# Patient Record
Sex: Male | Born: 1970
Health system: Southern US, Community
[De-identification: ages and names within clinical notes are randomized; demographics above are authoritative.]

## PROBLEM LIST (undated history)

## (undated) DIAGNOSIS — IMO0002 Reserved for concepts with insufficient information to code with codable children: Secondary | ICD-10-CM

## (undated) DIAGNOSIS — I2699 Other pulmonary embolism without acute cor pulmonale: Secondary | ICD-10-CM

## (undated) DIAGNOSIS — L0591 Pilonidal cyst without abscess: Secondary | ICD-10-CM

## (undated) DIAGNOSIS — Z22322 Carrier or suspected carrier of Methicillin resistant Staphylococcus aureus: Secondary | ICD-10-CM

## (undated) DIAGNOSIS — F329 Major depressive disorder, single episode, unspecified: Secondary | ICD-10-CM

## (undated) DIAGNOSIS — K219 Gastro-esophageal reflux disease without esophagitis: Secondary | ICD-10-CM

## (undated) DIAGNOSIS — G629 Polyneuropathy, unspecified: Secondary | ICD-10-CM

## (undated) DIAGNOSIS — I739 Peripheral vascular disease, unspecified: Secondary | ICD-10-CM

## (undated) DIAGNOSIS — G8929 Other chronic pain: Secondary | ICD-10-CM

## (undated) DIAGNOSIS — I82629 Acute embolism and thrombosis of deep veins of unspecified upper extremity: Secondary | ICD-10-CM

## (undated) DIAGNOSIS — I10 Essential (primary) hypertension: Secondary | ICD-10-CM

## (undated) DIAGNOSIS — G4730 Sleep apnea, unspecified: Secondary | ICD-10-CM

## (undated) DIAGNOSIS — F32A Depression, unspecified: Secondary | ICD-10-CM

## (undated) DIAGNOSIS — F419 Anxiety disorder, unspecified: Secondary | ICD-10-CM

## (undated) DIAGNOSIS — M549 Dorsalgia, unspecified: Secondary | ICD-10-CM

## (undated) HISTORY — DX: Polyneuropathy, unspecified: G62.9

## (undated) HISTORY — DX: Carrier or suspected carrier of methicillin resistant Staphylococcus aureus: Z22.322

## (undated) HISTORY — DX: Depression, unspecified: F32.A

## (undated) HISTORY — DX: Major depressive disorder, single episode, unspecified: F32.9

## (undated) HISTORY — DX: Essential (primary) hypertension: I10

## (undated) HISTORY — DX: Peripheral vascular disease, unspecified: I73.9

## (undated) HISTORY — DX: Anxiety disorder, unspecified: F41.9

## (undated) HISTORY — PX: OTHER SURGICAL HISTORY: SHX169

---

## 1998-10-19 HISTORY — PX: THORACIC OUTLET SURGERY: SHX2502

## 2000-10-19 HISTORY — PX: WRIST FUSION: SHX839

## 2000-11-26 ENCOUNTER — Encounter: Payer: Self-pay | Admitting: Vascular Surgery

## 2000-11-30 ENCOUNTER — Encounter: Payer: Self-pay | Admitting: Vascular Surgery

## 2000-11-30 ENCOUNTER — Inpatient Hospital Stay (HOSPITAL_COMMUNITY): Admission: RE | Admit: 2000-11-30 | Discharge: 2000-12-04 | Payer: Self-pay | Admitting: Vascular Surgery

## 2000-11-30 ENCOUNTER — Encounter (INDEPENDENT_AMBULATORY_CARE_PROVIDER_SITE_OTHER): Payer: Self-pay | Admitting: Specialist

## 2001-11-15 ENCOUNTER — Encounter: Payer: Self-pay | Admitting: Occupational Medicine

## 2001-11-15 ENCOUNTER — Encounter: Admission: RE | Admit: 2001-11-15 | Discharge: 2001-11-15 | Payer: Self-pay | Admitting: Occupational Medicine

## 2002-02-16 ENCOUNTER — Encounter: Payer: Self-pay | Admitting: Occupational Medicine

## 2002-02-16 ENCOUNTER — Emergency Department (HOSPITAL_COMMUNITY): Admission: EM | Admit: 2002-02-16 | Discharge: 2002-02-16 | Payer: Self-pay | Admitting: Emergency Medicine

## 2002-02-16 ENCOUNTER — Encounter: Admission: RE | Admit: 2002-02-16 | Discharge: 2002-02-16 | Payer: Self-pay | Admitting: Occupational Medicine

## 2002-02-21 ENCOUNTER — Ambulatory Visit (HOSPITAL_COMMUNITY): Admission: RE | Admit: 2002-02-21 | Discharge: 2002-02-21 | Payer: Self-pay | Admitting: Orthopedic Surgery

## 2002-02-21 ENCOUNTER — Encounter: Payer: Self-pay | Admitting: Orthopedic Surgery

## 2002-09-01 ENCOUNTER — Encounter: Payer: Self-pay | Admitting: Family Medicine

## 2002-09-01 ENCOUNTER — Encounter: Admission: RE | Admit: 2002-09-01 | Discharge: 2002-09-01 | Payer: Self-pay | Admitting: Family Medicine

## 2004-03-24 ENCOUNTER — Encounter: Admission: RE | Admit: 2004-03-24 | Discharge: 2004-03-24 | Payer: Self-pay | Admitting: Family Medicine

## 2004-04-01 ENCOUNTER — Ambulatory Visit (HOSPITAL_BASED_OUTPATIENT_CLINIC_OR_DEPARTMENT_OTHER): Admission: RE | Admit: 2004-04-01 | Discharge: 2004-04-01 | Payer: Self-pay | Admitting: Family Medicine

## 2004-06-28 ENCOUNTER — Encounter
Admission: RE | Admit: 2004-06-28 | Discharge: 2004-06-28 | Payer: Self-pay | Admitting: Physical Medicine and Rehabilitation

## 2004-11-19 ENCOUNTER — Encounter: Admission: RE | Admit: 2004-11-19 | Discharge: 2004-11-19 | Payer: Self-pay | Admitting: Family Medicine

## 2004-12-16 ENCOUNTER — Encounter: Admission: RE | Admit: 2004-12-16 | Discharge: 2004-12-16 | Payer: Self-pay | Admitting: Specialist

## 2009-07-13 ENCOUNTER — Emergency Department (HOSPITAL_COMMUNITY): Admission: EM | Admit: 2009-07-13 | Discharge: 2009-07-13 | Payer: Self-pay | Admitting: Emergency Medicine

## 2010-09-05 ENCOUNTER — Telehealth: Payer: Self-pay | Admitting: Internal Medicine

## 2010-09-09 ENCOUNTER — Ambulatory Visit: Payer: Self-pay | Admitting: Internal Medicine

## 2010-09-09 DIAGNOSIS — M545 Low back pain, unspecified: Secondary | ICD-10-CM | POA: Insufficient documentation

## 2010-09-09 DIAGNOSIS — F17200 Nicotine dependence, unspecified, uncomplicated: Secondary | ICD-10-CM | POA: Insufficient documentation

## 2010-09-15 ENCOUNTER — Encounter: Payer: Self-pay | Admitting: Internal Medicine

## 2010-11-18 NOTE — Progress Notes (Signed)
Summary: late rsc ov  Phone Note Call from Patient Call back at [facility] Phone [phone]   Caller: Patient Call For: kw Summary of Call: Pt rsc late due to childcare issues and no self pay monies. Pt rsc to 09-09-2010 morning ov. Initial call taken by: Heron Sabins,  September 05, 2010 1:20 PM  Follow-up for Phone Call        noted-  no charge Follow-up by: Gordy Savers  MD,  September 05, 2010 1:56 PM  Additional Follow-up for Phone Call Additional follow up Details #1::        [facility] Additional Follow-up by: Heron Sabins,  September 05, 2010 3:27 PM    Additional Follow-up for Phone Call Additional follow up Details #2::    pt is aware he will not be charged for late rsc ov Follow-up by: Heron Sabins,  September 09, 2010 11:45 AM

## 2010-11-18 NOTE — Letter (Signed)
Summary: PATIENT HX FORM  PATIENT HX FORM   Imported By: Georgian Co 09/15/2010 16:00:41  _____________________________________________________________________  External Attachment:    Type:   Image     Comment:   External Document

## 2010-11-18 NOTE — Assessment & Plan Note (Signed)
Summary: BRAND NEW PT/TO EST/CJR pt rsc due to childcare issues/njr   Vital Signs:  Patient profile:   40 year old male Height:      75.5 inches Weight:      325 pounds BMI:     40.23 Temp:     98.3 degrees F oral BP sitting:   138 / 74  (right arm) Cuff size:   large  Vitals Entered By: Duard Brady LPN (September 09, 2010 8:40 AM) CC: new to establish - back pain Is Patient Diabetic? No   CC:  new to establish - back pain.  History of Present Illness: 40 year old patient who is seen today to establish with our practice.  He has a history of low back pain since 2005 and has been unable to work since October of 2009.  He has been followed by Dr. Horald Chestnut and Dr. Shon Baton and has benefited somewhat from injection therapy.  He has a history of exogenous obesity.  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  2000 exogenous obesity tobacco abuse negative sleep study for OSA, 2005  Past Surgical History: status post rib resection  2000 due to thoracic outlet obstruction, and DVT left upper extremity, complicated by pulmonary embolism lumbar epidurals surgery for a fracture, right wrist  Family History: Reviewed history and no changes required. father age 42, history of postural arthritis, chronic low back pain PAD, obstructive sleep apnea mother died at 51, complications of diabetes, and lower extremity infection  One brother one sister both older in good health  Social History: Reviewed history and no changes required. Divorced Current Smoker has applied for disability has been unable to work since October 2009Smoking Status:  current  Review of  Systems       The patient complains of weight gain.  The patient denies anorexia, fever, weight loss, vision loss, decreased hearing, hoarseness, chest pain, syncope, dyspnea on exertion, peripheral edema, prolonged cough, headaches, hemoptysis, abdominal pain, melena, hematochezia, severe indigestion/heartburn, hematuria, incontinence, genital sores, muscle weakness, suspicious skin lesions, transient blindness, difficulty walking, depression, unusual weight change, abnormal bleeding, enlarged lymph nodes, angioedema, breast masses, and testicular masses.    Physical Exam  General:  overweight-appearing.  and blood pressure 110/70 Head:  Normocephalic and atraumatic without obvious abnormalities. No apparent alopecia or balding. Eyes:  No corneal or conjunctival inflammation noted. EOMI. Perrla. Funduscopic exam benign, without hemorrhages, exudates or papilledema. Vision grossly normal. Ears:  External ear exam shows no significant lesions or deformities.  Otoscopic examination reveals clear canals, tympanic membranes are intact bilaterally without bulging, retraction, inflammation or discharge. Hearing is grossly normal bilaterally. Nose:  External nasal examination shows no deformity or inflammation. Nasal mucosa are pink and moist without lesions or exudates. Mouth:  pharyngeal crowding and low-lying uvula.   Neck:  No deformities, masses, or tenderness noted. Chest Wall:  No deformities, masses, tenderness or gynecomastia noted. Breasts:  No masses or gynecomastia noted Lungs:  Normal respiratory effort, chest expands symmetrically. Lungs are clear to auscultation, no crackles or wheezes. Heart:  Normal rate and regular rhythm. S1 and S2 normal without gallop, murmur, click, rub or other extra sounds. Abdomen:  Bowel sounds positive,abdomen soft and non-tender without masses, organomegaly or  hernias noted. Genitalia:  Testes bilaterally descended without nodularity, tenderness or masses. No  scrotal masses or lesions. No penis lesions or urethral discharge. Msk:  No deformity or scoliosis noted of thoracic or lumbar spine.   Pulses:  R and L carotid,radial,femoral,dorsalis pedis and posterior tibial pulses are full and equal bilaterally Extremities:  No clubbing, cyanosis, edema, or deformity noted with normal full range of motion of all joints.   Neurologic:  alert & oriented X3 and cranial nerves II-XII intact.   patellar and Achilles reflexes intact.  Slight subjective decrease sensation left foot and ankle no motor deficit Skin:  Intact without suspicious lesions or rashes multiple tattoos Cervical Nodes:  No lymphadenopathy noted Axillary Nodes:  No palpable lymphadenopathy Inguinal Nodes:  No significant adenopathy Psych:  Cognition and judgment appear intact. Alert and cooperative with normal attention span and concentration. No apparent delusions, illusions, hallucinations   Impression & Recommendations:  Problem # 1:  LOW BACK PAIN, CHRONIC (ICD-724.2)  Problem # 2:  OBESITY (ICD-278.00)  Problem # 3:  TOBACCO ABUSE (ICD-305.1)  Patient Instructions: 1)  Please schedule a follow-up appointment as needed. 2)  Limit your Sodium (Salt). 3)  Tobacco is very bad for your health and your loved ones! You Should stop smoking!. 4)  It is important that you exercise regularly at least 20 minutes 5 times a week. If you develop chest pain, have severe difficulty breathing, or feel very tired , stop exercising immediately and seek medical attention. 5)  You need to lose weight. Consider a lower calorie diet and regular exercise.  and a  Orders Added: 1)  New Patient Level IV [16109]

## 2011-01-23 LAB — POCT I-STAT, CHEM 8
BUN: 13 mg/dL (ref 6–23)
Calcium, Ion: 1.18 mmol/L (ref 1.12–1.32)
Chloride: 105 mEq/L (ref 96–112)
Creatinine, Ser: 0.9 mg/dL (ref 0.4–1.5)
Glucose, Bld: 130 mg/dL — ABNORMAL HIGH (ref 70–99)
HCT: 42 % (ref 39.0–52.0)
Hemoglobin: 14.3 g/dL (ref 13.0–17.0)
Potassium: 3.5 mEq/L (ref 3.5–5.1)
Sodium: 140 mEq/L (ref 135–145)
TCO2: 24 mmol/L (ref 0–100)

## 2011-01-23 LAB — CBC
HCT: 42.2 % (ref 39.0–52.0)
Hemoglobin: 15 g/dL (ref 13.0–17.0)
MCHC: 35.7 g/dL (ref 30.0–36.0)
MCV: 96 fL (ref 78.0–100.0)
Platelets: 206 10*3/uL (ref 150–400)
RBC: 4.39 MIL/uL (ref 4.22–5.81)
RDW: 15.1 % (ref 11.5–15.5)
WBC: 9.5 10*3/uL (ref 4.0–10.5)

## 2011-01-23 LAB — DIFFERENTIAL
Basophils Absolute: 0 10*3/uL (ref 0.0–0.1)
Basophils Relative: 0 % (ref 0–1)
Eosinophils Absolute: 0.2 10*3/uL (ref 0.0–0.7)
Eosinophils Relative: 2 % (ref 0–5)
Lymphocytes Relative: 30 % (ref 12–46)
Lymphs Abs: 2.8 10*3/uL (ref 0.7–4.0)
Monocytes Absolute: 0.5 10*3/uL (ref 0.1–1.0)
Monocytes Relative: 5 % (ref 3–12)
Neutro Abs: 6 10*3/uL (ref 1.7–7.7)
Neutrophils Relative %: 63 % (ref 43–77)

## 2011-01-23 LAB — HEMOCCULT GUIAC POC 1CARD (OFFICE): Fecal Occult Bld: POSITIVE

## 2011-03-06 NOTE — Op Note (Signed)
[place]. [facility]  Patient:    Eduardo [name], Eduardo [name]                  MRN: [id number] Proc. Date: 11/30/00 Adm. Date:  [id number] Attending:  Bennye Alm                           Operative Report  PREOPERATIVE DIAGNOSIS:  Thoracic outlet syndrome.  POSTOPERATIVE DIAGNOSIS:  Thoracic outlet syndrome.  OPERATION PERFORMED:  Transaxillary, left first rib resection.  SURGEON:  Di Kindle. Edilia Bo, M.D.  ASSISTANT: 1. D. Karle Plumber, M.D. 2. Loura Pardon, P.A. 3. Sherrie George, P.A.  ANESTHESIA:  General.  INDICATIONS FOR PROCEDURE:  The patient is a 40 year old gentleman who had been in [place] and developed a Paget-von Shrotter syndrome.  This was lysed open in [place] and he underwent successful thrombolysis.  He had been on Coumadin for this.  He was then seen back in [place] and it was felt that he did have a thoracic outlet syndrome explaining his venous problem.  However, his symptoms resolved and we initially decided to not proceed with first rib resection.  He later presented with left arm weakness and paresthesias in the left arm and evidence of neurogenic thoracic outlet syndrome.  Therefore, after extensive discussion, we elected to proceed with transaxillary first rib resection.  We reviewed the potential complications of the procedure in detail including but not limited to bleeding, nerve injury, paralysis of the diaphragm secondary to injury of the phrenic nerve, arterial or venous injury, injury to the brachial plexus, persistent pain, pneumothorax or other unpredictable medical problems.  All questions were answered and he wished to proceed with surgery.  DESCRIPTION OF PROCEDURE:  The patient was taken to the operating room and received a general anesthetic.  The patient was placed in a full lateral decubitus position.  The back, axillary area, shoulder and left arm were prepped and draped in the  usual sterile fashion.  The hand and forearm were placed in a sterile stockinette.  The assistant elevated the arm.  An incision was made at the inferior border of the axillary hair line and the dissection was carried down to the axillary fascia to the serratus anterior and bluntly, this dissection was continued up to the level of the first rib.  The lateral thoracic artery and vein were divided as was the subscapular vein.  I tried to stay medial away from the long thoracic and thoracodorsal nerves.  This was difficult dissection as the patient was large and very muscular but we were able to identify the first rib and also the entry of the artery, vein and brachial plexus.  The intercostal muscles were divided on top of the first rib and periosteum was elevated circumferentially.  This was a subperiosteal dissection.  As it was as extremely deep hole, I could not fit the bone shear up to this level and we chipped away at the bone using a rongeur.  I then resected in a piecemeal fashion an approximately 2 cm to 3 cm segment of the bone underneath the axillary vein, axillary artery and brachial plexus.  The edges of the bone were rasped.  Careful hemostasis was obtained.  We did not enter the pleura.  We did check for this at completion and there was no evidence of pleural injury.  I also of note, did divide the anterior scalene muscle on top of the border of the first  rib and also partially divided the posterior scalene muscle.  Hemostasis was obtained in the wound and a 15 Blake drain was placed.  The wound was closed with a deep layer of 2-0 Vicryl, a subcutaneous layer of 3-0 Vicryl and the skin closed with 4-0 subcuticular stitch.  A sterile dressing was applied.  The patient tolerated the procedure well, was transferred to the recovery room in satisfactory condition.  The sponge and needle counts were correct. DD:  11/30/00 TD:  12/01/00 Job: [id number]

## 2011-03-06 NOTE — Discharge Summary (Signed)
[place]. [facility]  Patient:    Eduardo [name], Eduardo [name]                  MRN: [id number] Adm. Date:  [id number] Disc. Date: 12/04/00 Attending:  Bennye Alm Dictator:   Loura Pardon, P.A.-C. CC:         Desma Maxim, M.D.   Discharge Summary  ADDENDUM  DATE OF BIRTH:  1970/11/22  REFERRING PHYSICIAN:  Desma Maxim, M.D.  [name] discharge was moved up from December 02, 2000 to December 04, 2000, mostly due to pain concerns.  [name] has been supported throughout his hospitalization on IV pain medications, at first the PCA morphine pump, then adding IV Toradol and now IV Toradol and oral Dilaudid. His incision is healing nicely.  There was no evidence of erythema or drainage.  He is gradually becoming less dependent upon pain medication.  His left arm is becoming more mobile.  He has regained much of the feeling that was lost in the fourth and fifth digits of the left hand and the ulnar nerve supply in the left hand.  He still has some numbness in the triceps area of the upper arm and in the medial portion of the left forearm.  He is working with physical therapy and will continue to work with physical therapy as an outpatient to regain range of motion and strengthening in the left arm.  He will go home now on the medications on December 04, 2000.  DISCHARGE MEDICATIONS: 1. Dilaudid 2 mg 1-2 tabs p.o. q.3-8h. as needed for pain. 2. Vioxx 50 mg daily.  He is to ambulate as tolerated.  He is asked not to drive nor lift anything more than 10 pounds for six weeks.  He may shower daily, beginning upon his discharge, and he will have an office visit with Dr. Edilia Bo, Wednesday, December 05, 2000, at 8:10 in the morning.  Once again, physical therapy is arranged for him as an outpatient. DD:  12/03/00 TD:  12/04/00 Job: [id number]

## 2011-03-06 NOTE — H&P (Signed)
[place]. [facility]  Patient:    Eduardo [name], Eduardo [name]                    MRN: [id number] Adm. Date:  11/30/00 Attending:  Di Kindle. Edilia Bo, M.D. Dictator:   Marlowe Kays, P.A. CC:         Desma Maxim, M.D.   History and Physical  DATE OF BIRTH:  16-Sep-1971  HISTORY OF PRESENT ILLNESS:  Eduardo [name] is a pleasant 40 year old white male, referred by Dr. 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  He was admitted to the hospital in [facility] on August 31, 2000, and was found to have significant left subclavian vein/axillary vein deep vein thrombosis.  He underwent an extensive evaluation, including a venogram which showed occlusive thrombosis on the left brachial and subclavian and axillary vein, and underwent a successful thrombolysis; however, he was left with an irregular segment of vein consistent with thoracic outlet syndrome and Paget-von Schrotter syndrome.   The patient was evaluated by Dr. Di Kindle. Edilia Bo for possible first rib resection.  At the time of the initial visit on September 15, 2000, the patient was still symptomatic, complaining of mild chest pain related to his pulmonary embolism, experienced during the hospitalization.  It was recommended that the patient be on Coumadin therapy for about two months, and see if the symptoms would resolve.  By October 20, 2000, the symptoms had subsided, therefore a nonoperative approach was taken; however, he presented on October 21, 2000, with evidence of neurogenic thoracic outlet syndrome, with paresthesias along the lateral aspect of the left arm, and left arm weakness, worse with external rotation and abduction of the arm. No other complaints.  No chest pain.  No shortness of breath or dyspnea on exertion.   Dr. Edilia Bo recommends that the patient undergo a first rib resection, which is scheduled for November 30, 2000.  No cough or sputum production.  No fever or chills.  No unexplained weight loss or GERD symptoms. No arrhythmias, no peripheral edema.  No hemoptysis.  No symptoms of TIA, CVA, or amaurosis fugax.  PAST MEDICAL HISTORY: 1. 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 2. Recent history of PE during the hospitalization secondary to    number one in November 2001.  PAST SURGICAL HISTORY:  None.  CURRENT MEDICATIONS:  He was taking Coumadin 7.5 mg q.d., but stopped on November 24, 2000.  ALLERGIES:  No known drug allergies.  REVIEW OF SYSTEMS:  See the HPI and the past medical history for significant positives.  No diabetes mellitus, kidney disease, hypertension, or chronic obstructive pulmonary disease.  FAMILY HISTORY:  Significant for his mother with a history of diabetes mellitus, otherwise noncontributory.  SOCIAL HISTORY:  The patient is separated, with two children.  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  He denies any tobacco or alcohol intake.  PHYSICAL EXAMINATION:  GENERAL:  A well-developed, well-nourished 40 year old white male, in no acute distress, alert and oriented x 3.  VITAL SIGNS:  Blood pressure 120/80, pulse 64, respirations 16.  HEENT:  Head normocephalic, atraumatic.  PERRLA.  EOMI.  Funduscopic examination within normal limits.  NECK:  Supple, no jugular venous distention, bruits, or lymphadenopathy.  CHEST:  Symmetrical on inspiration.  LUNGS:  Clear to auscultation bilaterally.  CARDIOVASCULAR:  A regular rate and rhythm without murmurs, rubs, or gallops.  ABDOMEN:  Soft, nontender.  Bowel sounds x 4.  No masses or bruits.  GENITOURINARY:  Deferred.  RECTAL:  Deferred.  EXTREMITIES:  No cyanosis, clubbing, or edema.  No ulcerations.   Temperature warm.  PERIPHERAL PULSES:  Carotids 2+ bilaterally.  Femoral, popliteal, dorsalis pedis, and posterior tibialis 2+ bilaterally.  Gait is steady.  Deep tendon reflexes 2+ bilaterally.  Muscle strength 5/5.  ASSESSMENT/PLAN:  Thoracic outlet syndrome, for first rib resection, which will require significant physical therapy afterwards.  Dr. Edilia Bo has seen and evaluated this patient prior to the admission at [facility] on November 30, 2000, and has explained the risks and benefits involving the procedure, and the patient has agreed to continue. DD:  11/26/00 TD:  11/26/00 Job: [id number]

## 2011-03-06 NOTE — Discharge Summary (Signed)
[place]. [facility]  Patient:    Eduardo [name], Eduardo [name]                  MRN: [id number] Adm. Date:  [id number] Disc. Date: 12/02/00 Attending:  Bennye Alm Dictator:   Loura Pardon, P.A.-C. CC:         Desma Maxim, M.D.   Discharge Summary  DATE OF BIRTH:  1971-05-11  PRIMARY CARE PHYSICIAN:  Desma Maxim, M.D.  DISCHARGE DIAGNOSES: 1. 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 2. Neurogenic thoracic outlet syndrome despite conservative management. 3. Left subclavian/left axillary stenosis, hospitalized for thrombolysis with    subsequent pulmonary embolism November 2001.  SECONDARY DIAGNOSES:  None.  PROCEDURE:  November 30, 2000, transaxillary left first rib resection. Surgeon - Di Kindle. Edilia Bo, M.D.  The patient tolerated the procedure well and was transferred in a stable and satisfactory condition with adequate perfusion to the left upper extremity.  He awoke in the recovery room.  His mental status has been clear.  He had follow-up chest x-ray in the recovery room that showed no elevation of the left hemidiaphragm, no pneumothorax.  He had fine motor coordination and good sensation in the left hand.  DISCHARGE DISPOSITION:  Eduardo [name] is ready for discharge December 02, 2000, postoperative day #2.  He has remained afebrile in the postoperative period.  He does experience a fair amount of pain in the left axillary area where the incision was made.  He has full function of the left hand.  Range of motion is contracted in the left upper extremity secondary to pain.  His ability to abduct is decreased.  His postoperative laboratories have been within normal limits.  His hemoglobin was 15.7, hematocrit 44.8, creatinine 1.0, potassium 3.5 on the day after surgery.  His pain was controlled with PCA morphine; however, the patient does still experience some very acute pain which is  being controlled with Percocet in addition to the PCA morphine pump. He has been seen by physical therapy.  The physical therapists have explained the range of motion exercises to him, and he has been instructed in the "self-ranging" procedure to recondition the left upper extremity.  His incision was healing well.  There was no evidence of erythema or drainage. The JP drain was removed on postoperative day #1.  He has not required any supplemental oxygen in the postoperative period.  He has been afebrile.  His mental status has remained clear.  He is ambulating independently.  He is taking all nourishment well and tolerating it.  He has full GI tract function. He will go home on the following medications February 14.  DISCHARGE MEDICATIONS: 1. Tylox 1-2 tablets p.o. q.4-6. p.r.n. pain. 2. Vioxx 50 mg on February 14 and subsequently 25 mg daily.  DISCHARGE ACTIVITY:  Ambulation as tolerated.  DISCHARGE DIET:  Regular diet without restrictions.  He may shower daily beginning December 03, 2000, Friday.  He is to keep his incision clean and dry at all times.  He will have an office visit scheduled with Dr. Edilia Bo, December 15, 2000, at 8:10 in the morning.  BRIEF HISTORY:  [name] is a 40 year old male, referred by Desma Maxim, M.D.  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  He developed the sudden onset of swelling and discoloration of the left upper extremity  November 2001.  He was admitted to the hospital in [place], August 31, 2000.  At that point, he was found to have significant left subclavian, left axillary vein deep venous thrombosis. He underwent extensive evaluation, including a venogram, which showed occlusive thrombosis on the left brachial, subclavian, and axillary veins.  He underwent successful thrombolysis there.  He did, however, have  an irregular segment of vein consistent with thoracic outlet syndrome and Paget-von Schrotter syndrome.  The patient was evaluated by Dr. Waverly Ferrari, possible first rib resection.  At the time of the initial visit November 28, the patient was still symptomatic, complaining of mild chest pain related to his pulmonary embolism which he experienced during this hospitalization in November.  It was recommended that the patient continue on Coumadin therapy for two months to see if his symptoms would resolve.  By October 20, 2000, the symptoms had subsided somewhat, and a nonoperative approach was taken. However, he did present January 3 with evidence of neurogenic thoracic outlet syndrome with paresthesias along the lateral aspect of the left arm and left arm weakness which was made worse with external rotation and abduction of the arm.  Dr. Edilia Bo scheduled first rib resection November 30, 2000.  HOSPITAL COURSE:  As described in the discharge disposition.  Eduardo [name] was in a great deal of pain subsequent to the operation which was controlled with PCA morphine analgesic pump.  He was seen by physical therapy.  The physical therapist described range of motion exercises to be undertaken.  Mr. Argabright JP drain, which was placed intraoperatively, was removed on postoperative day #1.  His postoperative labs were within normal limits.  Mr. Eshelman at all times had clear mental status, was afebrile, did not require supplemental oxygen.  His incision looked good and was healing nicely.  He had preserved in the left upper extremity with good sensation in all digits with preserved fine motor coordination in the left hand.  His condition is stable and will no doubt improve as he recovers from the immediate postoperative period. DD:  12/01/00 TD:  12/02/00 Job: [id number]

## 2012-04-01 ENCOUNTER — Emergency Department (HOSPITAL_COMMUNITY)
Admission: EM | Admit: 2012-04-01 | Discharge: 2012-04-01 | Disposition: A | Discharge status: Home / Self Care | Payer: Self-pay | Attending: Emergency Medicine | Admitting: Emergency Medicine

## 2012-04-01 ENCOUNTER — Emergency Department (HOSPITAL_COMMUNITY): Payer: Self-pay

## 2012-04-01 ENCOUNTER — Encounter (HOSPITAL_COMMUNITY): Payer: Self-pay | Admitting: Nurse Practitioner

## 2012-04-01 DIAGNOSIS — I10 Essential (primary) hypertension: Secondary | ICD-10-CM | POA: Insufficient documentation

## 2012-04-01 DIAGNOSIS — S50812A Abrasion of left forearm, initial encounter: Secondary | ICD-10-CM

## 2012-04-01 DIAGNOSIS — S60512A Abrasion of left hand, initial encounter: Secondary | ICD-10-CM

## 2012-04-01 DIAGNOSIS — F17200 Nicotine dependence, unspecified, uncomplicated: Secondary | ICD-10-CM | POA: Insufficient documentation

## 2012-04-01 DIAGNOSIS — S5012XA Contusion of left forearm, initial encounter: Secondary | ICD-10-CM

## 2012-04-01 DIAGNOSIS — IMO0002 Reserved for concepts with insufficient information to code with codable children: Secondary | ICD-10-CM | POA: Insufficient documentation

## 2012-04-01 HISTORY — DX: Dorsalgia, unspecified: M54.9

## 2012-04-01 HISTORY — DX: Reserved for concepts with insufficient information to code with codable children: IMO0002

## 2012-04-01 HISTORY — DX: Other chronic pain: G89.29

## 2012-04-01 MED ORDER — OXYCODONE-ACETAMINOPHEN 5-325 MG PO TABS
2.0000 | ORAL_TABLET | Freq: Once | ORAL | Status: AC
  Administered 2012-04-01: 2 via ORAL
  Filled 2012-04-01: qty 2
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
## 2012-04-01 NOTE — Discharge Instructions (Signed)
Abrasions Abrasions are skin scrapes. Their treatment depends on how large and deep the abrasion is. Abrasions do not extend through all layers of the skin. A cut or lesion through all skin layers is called a laceration. HOME CARE INSTRUCTIONS   If you were given a dressing, change it at least once a day or as instructed by your caregiver. If the bandage sticks, soak it off with a solution of water or hydrogen peroxide.   Twice a day, wash the area with soap and water to remove all the cream/ointment. You may do this in a sink, under a tub faucet, or in a shower. Rinse off the soap and pat dry with a clean towel. Look for signs of infection (see below).   Reapply cream/ointment according to your caregiver's instruction. This will help prevent infection and keep the bandage from sticking. Telfa or gauze over the wound and under the dressing or wrap will also help keep the bandage from sticking.   If the bandage becomes wet, dirty, or develops a foul smell, change it as soon as possible.   Only take over-the-counter or prescription medicines for pain, discomfort, or fever as directed by your caregiver.  SEEK IMMEDIATE MEDICAL CARE IF:   Increasing pain in the wound.   Signs of infection develop: redness, swelling, surrounding area is tender to touch, or pus coming from the wound.   You have a fever.   Any foul smell coming from the wound or dressing.  Most skin wounds heal within ten days. Facial wounds heal faster. However, an infection may occur despite proper treatment. You should have the wound checked for signs of infection within 24 to 48 hours or sooner if problems arise. If you were not given a wound-check appointment, look closely at the wound yourself on the second day for early signs of infection listed above. MAKE SURE YOU:   Understand these instructions.   Will watch your condition.   Will get help right away if you are not doing well or get worse.  Document Released:  07/15/2005 Document Revised: 09/24/2011 Document Reviewed: 09/08/2011 [facility] Patient Information 2012 [place], [place].Bone Bruise  A bone bruise is a small hidden fracture of the bone. It typically occurs with bones located close to the surface of the skin.  SYMPTOMS  The pain lasts longer than a normal bruise.   The bruised area is difficult to use.   There may be discoloration or swelling of the bruised area.   When a bone bruise is found with injury to the anterior cruciate ligament (in the knee) there is often an increased:   Amount of fluid in the knee   Time the fluid in the knee lasts.   Number of days until you are walking normally and regaining the motion you had before the injury.   Number of days with pain from the injury.  DIAGNOSIS  It can only be seen on X-rays known as MRIs. This stands for magnetic resonance imaging. A regular X-Glori Machnik taken of a bone bruise would appear to be normal. A bone bruise is a common injury in the knee and the heel bone (calcaneus). The problems are similar to those produced by stress fractures, which are bone injuries caused by overuse. A bone bruise may also be a sign of other injuries. For example, bone bruises are commonly found where an anterior cruciate ligament (ACL) in the knee has been pulled away from the bone (ruptured). A ligament is a tough fibrous material that  connects bones together to make our joints stable. Bruises of the bone last a lot longer than bruises of the muscle or tissues beneath the skin. Bone bruises can last from days to months and are often more severe and painful than other bruises. TREATMENT Because bone bruises are sudden injuries you cannot often prevent them, other than by being extremely careful. Some things you can do to improve the condition are:  Apply ice to the sore area for 15 to 20 minutes, 3 to 4 times per day while awake for the first 2 days. Put the ice in a plastic bag, and place a towel between the  bag of ice and your skin.   Keep your bruised area raised (elevated) when possible to lessen swelling.   For activity:   Use crutches when necessary; do not put weight on the injured leg until you are no longer tender.   You may walk on your affected part as the pain allows, or as instructed.   Start weight bearing gradually on the bruised part.   Continue to use crutches or a cane until you can stand without causing pain, or as instructed.   If a plaster splint was applied, wear the splint until you are seen for a follow-up examination. Rest it on nothing harder than a pillow the first 24 hours. Do not put weight on it. Do not get it wet. You may take it off to take a shower or bath.   If an air splint was applied, more air may be blown into or out of the splint as needed for comfort. You may take it off at night and to take a shower or bath.   Wiggle your toes in the splint several times per day if you are able.   You may have been given an elastic bandage to use with the plaster splint or alone. The splint is too tight if you have numbness, tingling or if your foot becomes cold and blue. Adjust the bandage to make it comfortable.   Only take over-the-counter or prescription medicines for pain, discomfort, or fever as directed by your caregiver.   Follow all instructions for follow up with your caregiver. This includes any orthopedic referrals, physical therapy, and rehabilitation. Any delay in obtaining necessary care could result in a delay or failure of the bones to heal.  SEEK MEDICAL CARE IF:   You have an increase in bruising, swelling, or pain.   You notice coldness of your toes.   You do not get pain relief with medications.  SEEK IMMEDIATE MEDICAL CARE IF:   Your toes are numb or blue.   You have severe pain not controlled with medications.   If any of the problems that caused you to seek care are becoming worse.  Document Released: 12/26/2003 Document Revised:  09/24/2011 Document Reviewed: 05/09/2008 [facility] Patient Information 2012 [place], [place].Cryotherapy Cryotherapy means treatment with cold. Ice or gel packs can be used to reduce both pain and swelling. Ice is the most helpful within the first 24 to 48 hours after an injury or flareup from overusing a muscle or joint. Sprains, strains, spasms, burning pain, shooting pain, and aches can all be eased with ice. Ice can also be used when recovering from surgery. Ice is effective, has very few side effects, and is safe for most people to use. PRECAUTIONS  Ice is not a safe treatment option for people with:  Raynaud's phenomenon. This is a condition affecting small blood vessels in  the extremities. Exposure to cold may cause your problems to return.   Cold hypersensitivity. There are many forms of cold hypersensitivity, including:   Cold urticaria. Red, itchy hives appear on the skin when the tissues begin to warm after being iced.   Cold erythema. This is a red, itchy rash caused by exposure to cold.   Cold hemoglobinuria. Red blood cells break down when the tissues begin to warm after being iced. The hemoglobin that carry oxygen are passed into the urine because they cannot combine with blood proteins fast enough.   Numbness or altered sensitivity in the area being iced.  If you have any of the following conditions, do not use ice until you have discussed cryotherapy with your caregiver:  Heart conditions, such as arrhythmia, angina, or chronic heart disease.   High blood pressure.   Healing wounds or open skin in the area being iced.   Current infections.   Rheumatoid arthritis.   Poor circulation.   Diabetes.  Ice slows the blood flow in the region it is applied. This is beneficial when trying to stop inflamed tissues from spreading irritating chemicals to surrounding tissues. However, if you expose your skin to cold temperatures for too long or without the proper protection, you can  damage your skin or nerves. Watch for signs of skin damage due to cold. HOME CARE INSTRUCTIONS Follow these tips to use ice and cold packs safely.  Place a dry or damp towel between the ice and skin. A damp towel will cool the skin more quickly, so you may need to shorten the time that the ice is used.   For a more rapid response, add gentle compression to the ice.   Ice for no more than 10 to 20 minutes at a time. The bonier the area you are icing, the less time it will take to get the benefits of ice.   Check your skin after 5 minutes to make sure there are no signs of a poor response to cold or skin damage.   Rest 20 minutes or more in between uses.   Once your skin is numb, you can end your treatment. You can test numbness by very lightly touching your skin. The touch should be so light that you do not see the skin dimple from the pressure of your fingertip. When using ice, most people will feel these normal sensations in this order: cold, burning, aching, and numbness.   Do not use ice on someone who cannot communicate their responses to pain, such as small children or people with dementia.  HOW TO MAKE AN ICE PACK Ice packs are the most common way to use ice therapy. Other methods include ice massage, ice baths, and cryo-sprays. Muscle creams that cause a cold, tingly feeling do not offer the same benefits that ice offers and should not be used as a substitute unless recommended by your caregiver. To make an ice pack, do one of the following:  Place crushed ice or a bag of frozen vegetables in a sealable plastic bag. Squeeze out the excess air. Place this bag inside another plastic bag. Slide the bag into a pillowcase or place a damp towel between your skin and the bag.   Mix 3 parts water with 1 part rubbing alcohol. Freeze the mixture in a sealable plastic bag. When you remove the mixture from the freezer, it will be slushy. Squeeze out the excess air. Place this bag inside another  plastic bag. Slide the bag  into a pillowcase or place a damp towel between your skin and the bag.  SEEK MEDICAL CARE IF:  You develop white spots on your skin. This may give the skin a blotchy (mottled) appearance.   Your skin turns blue or pale.   Your skin becomes waxy or hard.   Your swelling gets worse.  MAKE SURE YOU:   Understand these instructions.   Will watch your condition.   Will get help right away if you are not doing well or get worse.  Document Released: 06/01/2011 Document Revised: 09/24/2011 Document Reviewed: 06/01/2011 [facility] Patient Information 2012 [place], [place].
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
## 2012-11-22 ENCOUNTER — Encounter (INDEPENDENT_AMBULATORY_CARE_PROVIDER_SITE_OTHER): Payer: Self-pay | Admitting: General Surgery

## 2012-11-22 ENCOUNTER — Ambulatory Visit (INDEPENDENT_AMBULATORY_CARE_PROVIDER_SITE_OTHER): Payer: Self-pay | Admitting: General Surgery

## 2012-11-22 VITALS — BP 152/80 | HR 76 | Temp 97.6°F | Resp 20 | Ht 75.0 in | Wt 298.6 lb

## 2012-11-22 DIAGNOSIS — L988 Other specified disorders of the skin and subcutaneous tissue: Secondary | ICD-10-CM

## 2012-11-22 DIAGNOSIS — L0591 Pilonidal cyst without abscess: Secondary | ICD-10-CM | POA: Insufficient documentation

## 2012-11-22 NOTE — Progress Notes (Signed)
Patient ID: Eduardo [name], male   DOB: 1971-02-24, 42 y.o.   MRN: [id number]  Chief Complaint  Patient presents with  . New Evaluation    eval draining pilo cyst    HPI Eduardo [name] is a 42 y.o. male.  With a chronic pilonidal cyst and disease. HPI  The patient has had chronic pilonidal disease for years. It is exacerbated by blunt injury several years ago and is chronically draining since was drained 2 or 3 years ago in the emergency department.  He comes in now because of continued pain and drainage. Past Medical History  Diagnosis Date  . Chronic back pain   . Degenerative disk disease   . Hypertension   . Seizures     Past Surgical History  Procedure Date  . Wrist fusion 2002  . Thoracic outlet surgery 2000    Family History  Problem Relation Age of Onset  . Cancer Maternal Grandmother     breast, ovarian & colon    Social History History  Substance Use Topics  . Smoking status: Current Every Day Smoker -- 1.0 packs/day  . Smokeless tobacco: Never Used  . Alcohol Use: No    No Known Allergies  Current Outpatient Prescriptions  Medication Sig Dispense Refill  . atenolol (TENORMIN) 25 MG tablet Take 25 mg by mouth daily.      . clonazePAM (KLONOPIN) 1 MG tablet Take 1 mg by mouth 2 (two) times daily as needed.      [name] oxyCODONE-acetaminophen (PERCOCET) 5-325 MG per tablet Take 1 tablet by mouth every 4 (four) hours as needed. For pain        Review of Systems Review of Systems  Constitutional: Negative for fever and chills.  Eyes: Negative.   Respiratory: Negative.   Cardiovascular: Negative.   Gastrointestinal: Negative.   Hematological: Negative.     Blood pressure 152/80, pulse 76, temperature 97.6 F (36.4 C), temperature source Temporal, resp. rate 20, height 6\' 3"  (1.905 m), weight 298 lb 9.6 oz (135.444 kg).  Physical Exam Physical Exam  Constitutional: He is oriented to person, place, and time. He appears well-developed and  well-nourished.  HENT:  Head: Normocephalic and atraumatic.  Eyes: Conjunctivae normal and EOM are normal. Pupils are equal, round, and reactive to light.  Neck: Normal range of motion. Neck supple.  Cardiovascular: Normal rate and regular rhythm.   Pulmonary/Chest: Effort normal and breath sounds normal.  Abdominal: Soft. Bowel sounds are normal.  Musculoskeletal: Normal range of motion.  Neurological: He is alert and oriented to person, place, and time. He has normal reflexes.  Skin: Skin is warm and dry.     Psychiatric: He has a normal mood and affect. His behavior is normal. Judgment and thought content normal.    Data Reviewed None  Assessment    Chronic pilonidal disease    Plan    Open excision with postoperative packing.       Cherylynn Ridges 11/22/2012, 10:39 AM

## 2012-12-20 ENCOUNTER — Encounter (HOSPITAL_COMMUNITY): Payer: Self-pay | Admitting: Emergency Medicine

## 2012-12-20 ENCOUNTER — Ambulatory Visit (HOSPITAL_BASED_OUTPATIENT_CLINIC_OR_DEPARTMENT_OTHER): Admit: 2012-12-20 | Payer: Self-pay | Admitting: General Surgery

## 2012-12-20 ENCOUNTER — Encounter (HOSPITAL_BASED_OUTPATIENT_CLINIC_OR_DEPARTMENT_OTHER): Payer: Self-pay

## 2012-12-20 ENCOUNTER — Emergency Department (INDEPENDENT_AMBULATORY_CARE_PROVIDER_SITE_OTHER)
Admission: EM | Admit: 2012-12-20 | Discharge: 2012-12-20 | Disposition: A | Discharge status: Home / Self Care | Payer: Self-pay | Source: Home / Self Care | Attending: Family Medicine | Admitting: Family Medicine

## 2012-12-20 DIAGNOSIS — T3991XA Poisoning by unspecified nonopioid analgesic, antipyretic and antirheumatic, accidental (unintentional), initial encounter: Secondary | ICD-10-CM

## 2012-12-20 SURGERY — EXCISION, PILONIDAL CYST, EXTENSIVE
Anesthesia: General

## 2012-12-20 MED ORDER — GI COCKTAIL ~~LOC~~
30.0000 mL | Freq: Once | ORAL | Status: AC
  Administered 2012-12-20: 30 mL via ORAL

## 2012-12-20 MED ORDER — RANITIDINE HCL 150 MG PO TABS: 150.0000 mg | ORAL_TABLET | Freq: Every day | ORAL | Status: DC

## 2012-12-20 MED ORDER — PANTOPRAZOLE SODIUM 40 MG PO TBEC: 40.0000 mg | DELAYED_RELEASE_TABLET | Freq: Every day | ORAL | Status: DC

## 2012-12-20 MED ORDER — GI COCKTAIL ~~LOC~~
ORAL | Status: AC
  Filled 2012-12-20: qty 30

## 2012-12-20 MED ORDER — ONDANSETRON 4 MG PO TBDP
ORAL_TABLET | ORAL | Status: AC
  Filled 2012-12-20: qty 1

## 2012-12-20 MED ORDER — ONDANSETRON 4 MG PO TBDP
4.0000 mg | ORAL_TABLET | Freq: Once | ORAL | Status: AC
  Administered 2012-12-20: 4 mg via ORAL

## 2012-12-20 NOTE — ED Provider Notes (Signed)
History     CSN: [id number]  Arrival date & time 12/20/12  1705   First MD Initiated Contact with Patient 12/20/12 1724      Chief Complaint  Patient presents with  . Abdominal Pain    (Consider location/radiation/quality/duration/timing/severity/associated sxs/prior treatment) Patient is a 42 y.o. male presenting with abdominal pain. The history is provided by the patient.  Abdominal Pain Pain location:  Epigastric Pain quality: burning   Pain radiates to:  Periumbilical region Pain severity:  Moderate Onset quality:  Sudden Timing:  Constant Progression:  Worsening Chronicity:  New Context comment:  Taking a lot of advil for back pain. Relieved by:  Nothing Associated symptoms: belching and nausea   Associated symptoms: no chest pain, no constipation, no diarrhea, no hematemesis, no hematochezia, no melena and no vomiting     Past Medical History  Diagnosis Date  . Chronic back pain   . Degenerative disk disease   . Hypertension   . Seizures     Past Surgical History  Procedure Laterality Date  . Wrist fusion  2002  . Thoracic outlet surgery  2000    Family History  Problem Relation Age of Onset  . Cancer Maternal Grandmother     breast, ovarian & colon    History  Substance Use Topics  . Smoking status: Current Every Day Smoker -- 1.00 packs/day  . Smokeless tobacco: Never Used  . Alcohol Use: No      Review of Systems  Constitutional: Positive for appetite change.  Cardiovascular: Negative for chest pain.  Gastrointestinal: Positive for nausea, abdominal pain and anal bleeding. Negative for vomiting, diarrhea, constipation, melena, hematochezia and hematemesis.    Allergies  Review of patient's allergies indicates no known allergies.  Home Medications   Current Outpatient Rx  Name  Route  Sig  Dispense  Refill  . atenolol (TENORMIN) 25 MG tablet   Oral   Take 25 mg by mouth daily.         . clonazePAM (KLONOPIN) 1 MG tablet   Oral  Take 1 mg by mouth 2 (two) times daily as needed.         [name] oxyCODONE-acetaminophen (PERCOCET) 5-325 MG per tablet   Oral   Take 1 tablet by mouth every 4 (four) hours as needed. For pain         . pantoprazole (PROTONIX) 40 MG tablet   Oral   Take 1 tablet (40 mg total) by mouth daily.   30 tablet   1   . ranitidine (ZANTAC) 150 MG tablet   Oral   Take 1 tablet (150 mg total) by mouth at bedtime.   30 tablet   0     BP 183/102  Pulse 74  Temp(Src) 98.5 F (36.9 C) (Oral)  Resp 18  SpO2 97%  Physical Exam  Nursing note and vitals reviewed. Constitutional: He is oriented to person, place, and time. He appears well-developed and well-nourished.  HENT:  Head: Normocephalic.  Right Ear: External ear normal.  Left Ear: External ear normal.  Mouth/Throat: Oropharynx is clear and moist.  Cardiovascular: Regular rhythm, normal heart sounds and intact distal pulses.   Pulmonary/Chest: Effort normal and breath sounds normal.  Abdominal: Soft. Bowel sounds are normal. He exhibits no distension and no mass. There is no hepatosplenomegaly. There is tenderness in the epigastric area. There is no rigidity, no rebound, no guarding and no CVA tenderness.  Genitourinary: Rectum normal and prostate normal. Rectal exam shows no external hemorrhoid,  no internal hemorrhoid, no mass and no tenderness. Guaiac negative stool.     Neurological: He is alert and oriented to person, place, and time.  Skin: Skin is warm and dry.    ED Course  Procedures (including critical care time)  Labs Reviewed - No data to display No results found.   1. Gastritis due to nonsteroidal anti-inflammatory drug, initial encounter       MDM          Linna Hoff, MD 12/21/12 [phone]

## 2012-12-20 NOTE — ED Notes (Signed)
Pt c/o abd pain x2 days Sx include: nauseas Denies: f/v/d, constipation Taking tums for discomfort  He is alert w/no signs of acute distress.

## 2012-12-22 ENCOUNTER — Emergency Department (HOSPITAL_COMMUNITY)
Admission: EM | Admit: 2012-12-22 | Discharge: 2012-12-23 | Disposition: A | Discharge status: Other Acute Inpatient Hospital | Payer: Self-pay | Attending: Emergency Medicine | Admitting: Emergency Medicine

## 2012-12-22 ENCOUNTER — Encounter (HOSPITAL_COMMUNITY): Payer: Self-pay

## 2012-12-22 ENCOUNTER — Other Ambulatory Visit: Payer: Self-pay

## 2012-12-22 DIAGNOSIS — F1120 Opioid dependence, uncomplicated: Secondary | ICD-10-CM | POA: Insufficient documentation

## 2012-12-22 DIAGNOSIS — F32A Depression, unspecified: Secondary | ICD-10-CM

## 2012-12-22 DIAGNOSIS — Z872 Personal history of diseases of the skin and subcutaneous tissue: Secondary | ICD-10-CM | POA: Insufficient documentation

## 2012-12-22 DIAGNOSIS — G8929 Other chronic pain: Secondary | ICD-10-CM | POA: Insufficient documentation

## 2012-12-22 DIAGNOSIS — Z8669 Personal history of other diseases of the nervous system and sense organs: Secondary | ICD-10-CM | POA: Insufficient documentation

## 2012-12-22 DIAGNOSIS — R197 Diarrhea, unspecified: Secondary | ICD-10-CM | POA: Insufficient documentation

## 2012-12-22 DIAGNOSIS — F329 Major depressive disorder, single episode, unspecified: Secondary | ICD-10-CM | POA: Insufficient documentation

## 2012-12-22 DIAGNOSIS — R45851 Suicidal ideations: Secondary | ICD-10-CM | POA: Insufficient documentation

## 2012-12-22 DIAGNOSIS — M549 Dorsalgia, unspecified: Secondary | ICD-10-CM | POA: Insufficient documentation

## 2012-12-22 DIAGNOSIS — F411 Generalized anxiety disorder: Secondary | ICD-10-CM | POA: Insufficient documentation

## 2012-12-22 DIAGNOSIS — I10 Essential (primary) hypertension: Secondary | ICD-10-CM | POA: Insufficient documentation

## 2012-12-22 DIAGNOSIS — F17200 Nicotine dependence, unspecified, uncomplicated: Secondary | ICD-10-CM | POA: Insufficient documentation

## 2012-12-22 DIAGNOSIS — F3289 Other specified depressive episodes: Secondary | ICD-10-CM | POA: Insufficient documentation

## 2012-12-22 DIAGNOSIS — Z8739 Personal history of other diseases of the musculoskeletal system and connective tissue: Secondary | ICD-10-CM | POA: Insufficient documentation

## 2012-12-22 DIAGNOSIS — F1210 Cannabis abuse, uncomplicated: Secondary | ICD-10-CM | POA: Insufficient documentation

## 2012-12-22 DIAGNOSIS — Z79899 Other long term (current) drug therapy: Secondary | ICD-10-CM | POA: Insufficient documentation

## 2012-12-22 DIAGNOSIS — F1320 Sedative, hypnotic or anxiolytic dependence, uncomplicated: Secondary | ICD-10-CM | POA: Insufficient documentation

## 2012-12-22 DIAGNOSIS — K625 Hemorrhage of anus and rectum: Secondary | ICD-10-CM | POA: Insufficient documentation

## 2012-12-22 HISTORY — DX: Pilonidal cyst without abscess: L05.91

## 2012-12-22 LAB — URINALYSIS, ROUTINE W REFLEX MICROSCOPIC
Glucose, UA: NEGATIVE mg/dL
Hgb urine dipstick: NEGATIVE
Ketones, ur: 15 mg/dL — AB
Leukocytes, UA: NEGATIVE
Nitrite: NEGATIVE
Protein, ur: NEGATIVE mg/dL
Specific Gravity, Urine: 1.026 (ref 1.005–1.030)
Urobilinogen, UA: 0.2 mg/dL (ref 0.0–1.0)
pH: 5.5 (ref 5.0–8.0)

## 2012-12-22 LAB — CBC WITH DIFFERENTIAL/PLATELET
Basophils Absolute: 0.1 10*3/uL (ref 0.0–0.1)
Basophils Relative: 0 % (ref 0–1)
Eosinophils Absolute: 0.2 10*3/uL (ref 0.0–0.7)
Eosinophils Relative: 1 % (ref 0–5)
HCT: 47.8 % (ref 39.0–52.0)
Hemoglobin: 17.9 g/dL — ABNORMAL HIGH (ref 13.0–17.0)
Lymphocytes Relative: 22 % (ref 12–46)
Lymphs Abs: 3.3 10*3/uL (ref 0.7–4.0)
MCH: 32.5 pg (ref 26.0–34.0)
MCHC: 37.4 g/dL — ABNORMAL HIGH (ref 30.0–36.0)
MCV: 86.8 fL (ref 78.0–100.0)
Monocytes Absolute: 0.9 10*3/uL (ref 0.1–1.0)
Monocytes Relative: 6 % (ref 3–12)
Neutro Abs: 10.5 10*3/uL — ABNORMAL HIGH (ref 1.7–7.7)
Neutrophils Relative %: 71 % (ref 43–77)
Platelets: 180 10*3/uL (ref 150–400)
RBC: 5.51 MIL/uL (ref 4.22–5.81)
RDW: 13.1 % (ref 11.5–15.5)
WBC: 14.9 10*3/uL — ABNORMAL HIGH (ref 4.0–10.5)

## 2012-12-22 LAB — COMPREHENSIVE METABOLIC PANEL
ALT: 22 U/L (ref 0–53)
AST: 18 U/L (ref 0–37)
Albumin: 4.5 g/dL (ref 3.5–5.2)
Alkaline Phosphatase: 103 U/L (ref 39–117)
BUN: 21 mg/dL (ref 6–23)
CO2: 24 mEq/L (ref 19–32)
Calcium: 10.1 mg/dL (ref 8.4–10.5)
Chloride: 102 mEq/L (ref 96–112)
Creatinine, Ser: 1.04 mg/dL (ref 0.50–1.35)
GFR calc Af Amer: >90 mL/min (ref >90)
GFR calc non Af Amer: 86 mL/min — ABNORMAL LOW (ref >90)
Glucose, Bld: 88 mg/dL (ref 70–99)
Potassium: 3.6 mEq/L (ref 3.5–5.1)
Sodium: 139 mEq/L (ref 135–145)
Total Bilirubin: 0.7 mg/dL (ref 0.3–1.2)
Total Protein: 8.3 g/dL (ref 6.0–8.3)

## 2012-12-22 LAB — ACETAMINOPHEN LEVEL: Acetaminophen (Tylenol), Serum: <15 ug/mL (ref 10–30)

## 2012-12-22 LAB — OCCULT BLOOD, POC DEVICE: Fecal Occult Bld: POSITIVE — AB

## 2012-12-22 LAB — RAPID URINE DRUG SCREEN, HOSP PERFORMED
Amphetamines: NOT DETECTED
Barbiturates: POSITIVE — AB
Benzodiazepines: POSITIVE — AB
Cocaine: NOT DETECTED
Opiates: NOT DETECTED
Tetrahydrocannabinol: POSITIVE — AB

## 2012-12-22 LAB — ETHANOL: Alcohol, Ethyl (B): <11 mg/dL (ref 0–11)

## 2012-12-22 LAB — LIPASE, BLOOD: Lipase: 56 U/L (ref 11–59)

## 2012-12-22 LAB — SALICYLATE LEVEL: Salicylate Lvl: <2 mg/dL — ABNORMAL LOW (ref 2.8–20.0)

## 2012-12-22 MED ORDER — ATENOLOL 25 MG PO TABS
25.0000 mg | ORAL_TABLET | Freq: Every day | ORAL | Status: DC
  Administered 2012-12-22 – 2012-12-23 (×2): 25 mg via ORAL
  Filled 2012-12-22 (×2): qty 1

## 2012-12-22 MED ORDER — FAMOTIDINE 20 MG PO TABS
20.0000 mg | ORAL_TABLET | Freq: Once | ORAL | Status: AC
  Administered 2012-12-22: 20 mg via ORAL
  Filled 2012-12-22: qty 1

## 2012-12-22 MED ORDER — NICOTINE 21 MG/24HR TD PT24
21.0000 mg | MEDICATED_PATCH | Freq: Every day | TRANSDERMAL | Status: DC
  Administered 2012-12-22 – 2012-12-23 (×2): 21 mg via TRANSDERMAL
  Filled 2012-12-22 (×2): qty 1

## 2012-12-22 MED ORDER — PANTOPRAZOLE SODIUM 40 MG PO TBEC
40.0000 mg | DELAYED_RELEASE_TABLET | Freq: Once | ORAL | Status: AC
  Administered 2012-12-22: 40 mg via ORAL
  Filled 2012-12-22: qty 1

## 2012-12-22 MED ORDER — GI COCKTAIL ~~LOC~~
30.0000 mL | Freq: Once | ORAL | Status: AC
  Administered 2012-12-22: 30 mL via ORAL
  Filled 2012-12-22: qty 30

## 2012-12-22 MED ORDER — ACETAMINOPHEN 500 MG PO TABS
1000.0000 mg | ORAL_TABLET | Freq: Once | ORAL | Status: AC
  Administered 2012-12-22: 1000 mg via ORAL
  Filled 2012-12-22: qty 2

## 2012-12-22 NOTE — BH Assessment (Signed)
[facility] Assessment Progress Note      Update:  [facility] willing to run pt if he understood he would not be receiving pain medications or narcotics per Thurman Coyer, RN, [facility].  Relayed this to pt and he stated he needed his meds, so he did not want to go to [facility].  Therefore, referrals sent elsewhere for consideration.  [facility], and per April @ 0731, beds possibly available so referral faxed for review.  Called [facility] and left message @ 1737.  Called [facility] and left message @ [phone].  Called [facility] and per Ed @ 1739, no beds.  Called [facility] and per [place], beds available @ 1740, so referral faxed for review.  [facility] and per [facility] @ [phone], beds available so referral faxed for review.

## 2012-12-22 NOTE — ED Notes (Signed)
P[facility], case management at the bedside.

## 2012-12-22 NOTE — ED Provider Notes (Signed)
History     CSN: [id number]  Arrival date & time 12/22/12  2130   First MD Initiated Contact with Patient 12/22/12 [phone]      Chief Complaint  Patient presents with  . Suicidal  . Rectal Bleeding  . Diarrhea    (Consider location/radiation/quality/duration/timing/severity/associated sxs/prior treatment) HPI This 42 year old male has a history of chronic severe back pain chronic generalized pain, chronic narcotic use and chronic as it has been used on his chronic anxiety depression, his family doctor call them in the office and the patient failed a drug test so his family doctor stopped his narcotics and benzodiazepines about a week ago, the patient also takes chronic ibuprofen for his chronic back pain, patient complains of ongoing severe back pain, he complains of new onset gradual constant epigastric burning abdominal pain 24 hours a day for the last week or so, he also describes several days of loose stools with some bright red blood, he has no chest pain no shortness breath no fever no confusion but his anxiety and depression are worse he is no suicidal, he is not homicidal or hallucinating, he would like detox or rehabilitation from his benzodiazepines and narcotics although he has not taken either of those in about a week, treatment prior to arrival for his abdominal pain consistent over-the-counter liquid antacid.There's been no nausea or vomiting.  Past Medical History  Diagnosis Date  . Chronic back pain   . Degenerative disk disease   . Hypertension   . Seizures   . Pilonidal cyst     Past Surgical History  Procedure Laterality Date  . Wrist fusion  2002  . Thoracic outlet surgery  2000    Family History  Problem Relation Age of Onset  . Cancer Maternal Grandmother     breast, ovarian & colon    History  Substance Use Topics  . Smoking status: Current Every Day Smoker -- 1.00 packs/day    Types: Cigarettes  . Smokeless tobacco: Never Used  . Alcohol Use: No       Review of Systems 10 Systems reviewed and are negative for acute change except as noted in the HPI. Allergies  Review of patient's allergies indicates no known allergies.  Home Medications   Current Outpatient Rx  Name  Route  Sig  Dispense  Refill  . atenolol (TENORMIN) 25 MG tablet   Oral   Take 25 mg by mouth daily.         . clonazePAM (KLONOPIN) 1 MG tablet   Oral   Take 1 mg by mouth 2 (two) times daily as needed.         . magnesium hydroxide (MILK OF MAGNESIA) 400 MG/5ML suspension   Oral   Take 30 mLs by mouth daily as needed for heartburn.         [name] oxyCODONE-acetaminophen (PERCOCET) 10-325 MG per tablet   Oral   Take 1 tablet by mouth every 4 (four) hours as needed for pain.           BP 123/78  Pulse 70  Temp(Src) 98.8 F (37.1 C) (Oral)  Resp 12  SpO2 98%  Physical Exam  Nursing note and vitals reviewed. Constitutional:  Awake, alert, nontoxic appearance with baseline speech for patient.  HENT:  Head: Atraumatic.  Mouth/Throat: No oropharyngeal exudate.  Eyes: EOM are normal. Pupils are equal, round, and reactive to light. Right eye exhibits no discharge. Left eye exhibits no discharge.  Neck: Neck supple.  Cardiovascular: Normal rate  and regular rhythm.   No murmur heard. Pulmonary/Chest: Effort normal and breath sounds normal. No stridor. No respiratory distress. He has no wheezes. He has no rales. He exhibits no tenderness.  Abdominal: Soft. Bowel sounds are normal. He exhibits no mass. There is tenderness. There is no rebound.  Minimal epigastric tenderness without rebound the rest of the abdomen is nontender  Genitourinary:  Rectal examination is somewhat tender no gross blood no palpable masses no fluctuance and light yellow liquid stool which is Hemoccult positive  Musculoskeletal: He exhibits no tenderness.  Baseline ROM, moves extremities with no obvious new focal weakness. Lumbar back diffusely tender without rash noted.   Lymphadenopathy:    He has no cervical adenopathy.  Neurological:  Awake, alert, cooperative and aware of situation; motor strength 5/5 bilaterally; sensation normal to light touch bilaterally; peripheral visual fields full to confrontation; no facial asymmetry; tongue midline; major cranial nerves appear intact; no pronator drift, normal finger to nose bilaterally, baseline gait without new ataxia.  Skin: No rash noted.  Psychiatric: He has a normal mood and affect.    ED Course  Procedures (including critical care time) Patient / Family / Caregiver understand and agree with initial ED impression and plan with expectations set for ED visit. We'll check labs for medical clearance prior to involving ACT/Telepsych. 0745  Labs unremarkable will move to Psych Hold Pod C. 1130  Telepsych recs IVC. 1430 IVC forms completed. Labs Reviewed  CBC WITH DIFFERENTIAL - Abnormal; Notable for the following:    WBC 14.9 (*)    Hemoglobin 17.9 (*)    MCHC 37.4 (*)    Neutro Abs 10.5 (*)    All other components within normal limits  COMPREHENSIVE METABOLIC PANEL - Abnormal; Notable for the following:    GFR calc non Af Amer 86 (*)    All other components within normal limits  URINALYSIS, ROUTINE W REFLEX MICROSCOPIC - Abnormal; Notable for the following:    Bilirubin Urine SMALL (*)    Ketones, ur 15 (*)    All other components within normal limits  URINE RAPID DRUG SCREEN (HOSP PERFORMED) - Abnormal; Notable for the following:    Benzodiazepines POSITIVE (*)    Tetrahydrocannabinol POSITIVE (*)    Barbiturates POSITIVE (*)    All other components within normal limits  SALICYLATE LEVEL - Abnormal; Notable for the following:    Salicylate Lvl <2.0 (*)    All other components within normal limits  OCCULT BLOOD, POC DEVICE - Abnormal; Notable for the following:    Fecal Occult Bld POSITIVE (*)    All other components within normal limits  LIPASE, BLOOD  ACETAMINOPHEN LEVEL  ETHANOL   No  results found.   1. Suicidal ideation   2. Depression   3. Narcotic dependence   4. Benzodiazepine dependence       MDM  Dispo pnd.        Hurman Horn, MD 12/22/12 2137

## 2012-12-22 NOTE — ED Notes (Signed)
Reminded Patient we need a urine sample

## 2012-12-22 NOTE — ED Notes (Signed)
Requesting "something" for nerves and low back pain-- chronic--dr bednar informed-- no new orders

## 2012-12-22 NOTE — ED Notes (Signed)
Dr. Bednar at the bedside.  
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
## 2012-12-22 NOTE — ED Notes (Signed)
telepsych completed 
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
## 2012-12-22 NOTE — ED Notes (Signed)
Consent to share information with sister DEBBIE HILL, at bedside,

## 2012-12-22 NOTE — ED Notes (Signed)
Pt has been taking quite a lot of ibuprofen for the past several days for chronic pain and now has been having gastric upset and bloody stools. Had been taking percocets and was called in for a random drug test at MD office and failed the test so they dismissed him. Has not had any percocet since Saturday. Also has not had any klonopin since Thursday or Friday. Has been having suicidal ideations since all this trouble. States, "I just can't deal with it". States his plan would be to "shoot myself"

## 2012-12-23 ENCOUNTER — Inpatient Hospital Stay (HOSPITAL_COMMUNITY)
Admission: AD | Admit: 2012-12-23 | Discharge: 2012-12-31 | DRG: 897 | Disposition: A | Discharge status: Home / Self Care | Payer: No Typology Code available for payment source | Source: Intra-hospital | Attending: Psychiatry | Admitting: Psychiatry

## 2012-12-23 ENCOUNTER — Encounter (HOSPITAL_COMMUNITY): Payer: Self-pay

## 2012-12-23 DIAGNOSIS — F411 Generalized anxiety disorder: Secondary | ICD-10-CM | POA: Diagnosis present

## 2012-12-23 DIAGNOSIS — F1320 Sedative, hypnotic or anxiolytic dependence, uncomplicated: Secondary | ICD-10-CM | POA: Diagnosis present

## 2012-12-23 DIAGNOSIS — G8929 Other chronic pain: Secondary | ICD-10-CM | POA: Diagnosis present

## 2012-12-23 DIAGNOSIS — I10 Essential (primary) hypertension: Secondary | ICD-10-CM | POA: Diagnosis present

## 2012-12-23 DIAGNOSIS — F1120 Opioid dependence, uncomplicated: Principal | ICD-10-CM | POA: Diagnosis present

## 2012-12-23 DIAGNOSIS — F329 Major depressive disorder, single episode, unspecified: Secondary | ICD-10-CM | POA: Diagnosis present

## 2012-12-23 DIAGNOSIS — M549 Dorsalgia, unspecified: Secondary | ICD-10-CM | POA: Diagnosis present

## 2012-12-23 DIAGNOSIS — Z79899 Other long term (current) drug therapy: Secondary | ICD-10-CM

## 2012-12-23 MED ORDER — CHLORDIAZEPOXIDE HCL 25 MG PO CAPS: 25.0000 mg | ORAL_CAPSULE | Freq: Four times a day (QID) | ORAL | Status: AC | PRN

## 2012-12-23 MED ORDER — NICOTINE 21 MG/24HR TD PT24
21.0000 mg | MEDICATED_PATCH | Freq: Every day | TRANSDERMAL | Status: DC
  Administered 2012-12-24 – 2012-12-25 (×2): 21 mg via TRANSDERMAL
  Filled 2012-12-23 (×5): qty 1

## 2012-12-23 MED ORDER — THIAMINE HCL 100 MG/ML IJ SOLN: 100.0000 mg | Freq: Once | INTRAMUSCULAR | Status: DC

## 2012-12-23 MED ORDER — METHOCARBAMOL 500 MG PO TABS
500.0000 mg | ORAL_TABLET | Freq: Three times a day (TID) | ORAL | Status: DC | PRN
  Administered 2012-12-25 – 2012-12-27 (×5): 500 mg via ORAL
  Filled 2012-12-23 (×7): qty 1

## 2012-12-23 MED ORDER — MIRTAZAPINE 15 MG PO TABS
15.0000 mg | ORAL_TABLET | Freq: Every day | ORAL | Status: DC
  Administered 2012-12-23 – 2012-12-30 (×8): 15 mg via ORAL
  Filled 2012-12-23: qty 14
  Filled 2012-12-23: qty 2
  Filled 2012-12-23 (×9): qty 1

## 2012-12-23 MED ORDER — ADULT MULTIVITAMIN W/MINERALS CH
1.0000 | ORAL_TABLET | Freq: Every day | ORAL | Status: DC
  Administered 2012-12-24 – 2012-12-28 (×5): 1 via ORAL
  Filled 2012-12-23 (×6): qty 1

## 2012-12-23 MED ORDER — ONDANSETRON 4 MG PO TBDP: 4.0000 mg | ORAL_TABLET | Freq: Four times a day (QID) | ORAL | Status: DC | PRN

## 2012-12-23 MED ORDER — NAPROXEN 500 MG PO TABS
500.0000 mg | ORAL_TABLET | Freq: Two times a day (BID) | ORAL | Status: AC | PRN
  Administered 2012-12-23 – 2012-12-28 (×7): 500 mg via ORAL
  Filled 2012-12-23 (×8): qty 1

## 2012-12-23 MED ORDER — HYDROXYZINE HCL 25 MG PO TABS
25.0000 mg | ORAL_TABLET | Freq: Four times a day (QID) | ORAL | Status: AC | PRN
  Administered 2012-12-24: 25 mg via ORAL

## 2012-12-23 MED ORDER — CHLORDIAZEPOXIDE HCL 25 MG PO CAPS
25.0000 mg | ORAL_CAPSULE | Freq: Once | ORAL | Status: AC
  Administered 2012-12-23: 25 mg via ORAL
  Filled 2012-12-23: qty 1

## 2012-12-23 MED ORDER — LOPERAMIDE HCL 2 MG PO CAPS: 2.0000 mg | ORAL_CAPSULE | ORAL | Status: DC | PRN

## 2012-12-23 MED ORDER — ACETAMINOPHEN 325 MG PO TABS
650.0000 mg | ORAL_TABLET | Freq: Four times a day (QID) | ORAL | Status: DC | PRN
  Administered 2012-12-26: 650 mg via ORAL

## 2012-12-23 MED ORDER — MIRTAZAPINE 15 MG PO TABS
15.0000 mg | ORAL_TABLET | Freq: Every day | ORAL | Status: DC
  Administered 2012-12-23: 15 mg via ORAL
  Filled 2012-12-23 (×2): qty 1

## 2012-12-23 MED ORDER — CHLORDIAZEPOXIDE HCL 25 MG PO CAPS
25.0000 mg | ORAL_CAPSULE | Freq: Three times a day (TID) | ORAL | Status: AC
  Administered 2012-12-25 (×3): 25 mg via ORAL
  Filled 2012-12-23 (×3): qty 1

## 2012-12-23 MED ORDER — OXYCODONE-ACETAMINOPHEN 5-325 MG PO TABS
2.0000 | ORAL_TABLET | Freq: Four times a day (QID) | ORAL | Status: DC | PRN
  Administered 2012-12-23 (×2): 2 via ORAL
  Filled 2012-12-23 (×2): qty 2

## 2012-12-23 MED ORDER — HYDROXYZINE HCL 25 MG PO TABS
25.0000 mg | ORAL_TABLET | Freq: Four times a day (QID) | ORAL | Status: AC | PRN
  Administered 2012-12-25: 25 mg via ORAL

## 2012-12-23 MED ORDER — ONDANSETRON 4 MG PO TBDP: 4.0000 mg | ORAL_TABLET | Freq: Four times a day (QID) | ORAL | Status: AC | PRN

## 2012-12-23 MED ORDER — MAGNESIUM HYDROXIDE 400 MG/5ML PO SUSP: 30.0000 mL | Freq: Every day | ORAL | Status: DC | PRN

## 2012-12-23 MED ORDER — VITAMIN B-1 100 MG PO TABS
100.0000 mg | ORAL_TABLET | Freq: Every day | ORAL | Status: DC
  Administered 2012-12-24 – 2012-12-31 (×8): 100 mg via ORAL
  Filled 2012-12-23 (×10): qty 1

## 2012-12-23 MED ORDER — CHLORDIAZEPOXIDE HCL 25 MG PO CAPS
25.0000 mg | ORAL_CAPSULE | ORAL | Status: AC
  Administered 2012-12-26: 25 mg via ORAL
  Filled 2012-12-23 (×2): qty 1

## 2012-12-23 MED ORDER — CHLORDIAZEPOXIDE HCL 25 MG PO CAPS
25.0000 mg | ORAL_CAPSULE | Freq: Four times a day (QID) | ORAL | Status: AC
  Administered 2012-12-23 – 2012-12-24 (×4): 25 mg via ORAL
  Filled 2012-12-23 (×4): qty 1

## 2012-12-23 MED ORDER — CLONIDINE HCL 0.1 MG PO TABS
0.1000 mg | ORAL_TABLET | ORAL | Status: DC
  Administered 2012-12-26: 0.1 mg via ORAL
  Filled 2012-12-23 (×4): qty 1

## 2012-12-23 MED ORDER — CLONAZEPAM 0.5 MG PO TABS
1.0000 mg | ORAL_TABLET | Freq: Two times a day (BID) | ORAL | Status: DC
  Administered 2012-12-23: 1 mg via ORAL
  Filled 2012-12-23: qty 2

## 2012-12-23 MED ORDER — IBUPROFEN 600 MG PO TABS: 600.0000 mg | ORAL_TABLET | Freq: Four times a day (QID) | ORAL | Status: DC | PRN

## 2012-12-23 MED ORDER — ALUM & MAG HYDROXIDE-SIMETH 200-200-20 MG/5ML PO SUSP
30.0000 mL | ORAL | Status: DC | PRN
  Administered 2012-12-30: 30 mL via ORAL

## 2012-12-23 MED ORDER — ATENOLOL 25 MG PO TABS
25.0000 mg | ORAL_TABLET | Freq: Every day | ORAL | Status: DC
  Administered 2012-12-24 – 2012-12-31 (×8): 25 mg via ORAL
  Filled 2012-12-23 (×6): qty 1
  Filled 2012-12-23: qty 3
  Filled 2012-12-23 (×3): qty 1

## 2012-12-23 MED ORDER — CLONIDINE HCL 0.1 MG PO TABS
0.1000 mg | ORAL_TABLET | Freq: Four times a day (QID) | ORAL | Status: AC
  Administered 2012-12-23 – 2012-12-25 (×8): 0.1 mg via ORAL
  Filled 2012-12-23 (×9): qty 1

## 2012-12-23 MED ORDER — CLONIDINE HCL 0.1 MG PO TABS
0.1000 mg | ORAL_TABLET | Freq: Every day | ORAL | Status: DC
  Filled 2012-12-23: qty 1

## 2012-12-23 MED ORDER — LOPERAMIDE HCL 2 MG PO CAPS
2.0000 mg | ORAL_CAPSULE | ORAL | Status: AC | PRN
  Administered 2012-12-28: 4 mg via ORAL

## 2012-12-23 MED ORDER — DICYCLOMINE HCL 20 MG PO TABS: 20.0000 mg | ORAL_TABLET | Freq: Four times a day (QID) | ORAL | Status: AC | PRN

## 2012-12-23 MED ORDER — CHLORDIAZEPOXIDE HCL 25 MG PO CAPS: 25.0000 mg | ORAL_CAPSULE | Freq: Every day | ORAL | Status: DC

## 2012-12-23 NOTE — Progress Notes (Signed)
Patient ID: Eduardo [name], male   DOB: [birth year], 42 y.o.   MRN: [id number] Admission note: D:Patient is a Involuntary admission in no acute distress for depression, substance abuse, and chronic back pain. Pt presents with depressed mood and flat affect.  Pt stated his depression started in 2009 after he lost his job and house. Pt stated he has been turned down for medicaid X 3 and disability. Pt stated he took "a bunch" of advil not in an attempt to OD but to help with his back pain. Pt also stated self medicating with THC. Pt has a work- related left arm injury. He stated numbness in left upper arm. No B/P in left arm. Pt denies SI/HI.  A: Pt admitted to unit per protocol, skin assessment and search done.  Pt skin has multiple tattoos on bilateral arms and back. Pt educated on therapeutic milieu rules. Pt was introduced to milieu by nursing staff. Fall risk safety plan explained to the patient.   R: Pt was receptive to education. Pt contracted for safety, 15 min safety checks started. Writer offered support.
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
## 2012-12-23 NOTE — Progress Notes (Signed)
Patient accepted by Assunta Found, NP to Dr. Runell Gess service. Rosey Bath, RN

## 2012-12-23 NOTE — ED Notes (Signed)
[employer] contact by off duty officer concerning pt transfer

## 2012-12-23 NOTE — Tx Team (Signed)
Initial Interdisciplinary Treatment Plan  PATIENT STRENGTHS: (choose at least two) Ability for insight  PATIENT STRESSORS: Financial difficulties Health problems Substance abuse   PROBLEM LIST: Problem List/Patient Goals Date to be addressed Date deferred Reason deferred Estimated date of resolution  depression 12/22/2012     Chronic back pain 12/22/2012     Substance abuse 12/22/2012                                          DISCHARGE CRITERIA:  Ability to meet basic life and health needs Improved stabilization in mood, thinking, and/or behavior Medical problems require only outpatient monitoring Motivation to continue treatment in a less acute level of care  PRELIMINARY DISCHARGE PLAN: Attend aftercare/continuing care group Attend PHP/IOP Outpatient therapy Return to previous living arrangement  PATIENT/FAMIILY INVOLVEMENT: This treatment plan has been presented to and reviewed with the patient, Eduardo [name], and/or family member,   The patient and family have been given the opportunity to ask questions and make suggestions.  Eduardo [name], Eduardo [name] 12/23/2012, 10:36 PM/

## 2012-12-23 NOTE — BH Assessment (Signed)
[facility] Assessment Progress Note  Eduardo [name] from [facility] called around 23:30 to report that they do not have any beds but they will consider him when they have discharges later in the day on 03/07.  [facility] called around 04:00 and said that they do not do detox.  Dr. Carole Binning at [facility] declined patient.  On-coming clinician to check with Eduardo [name] about whether they will take him.

## 2012-12-23 NOTE — ED Notes (Signed)
Requesting something for pain in back-- sitting in chair, sitting on edge of bed, standing up, "can't get comfortable"

## 2012-12-23 NOTE — ED Notes (Signed)
Pt transported to [facility] by [employer]

## 2012-12-24 DIAGNOSIS — F1320 Sedative, hypnotic or anxiolytic dependence, uncomplicated: Secondary | ICD-10-CM | POA: Diagnosis present

## 2012-12-24 DIAGNOSIS — I10 Essential (primary) hypertension: Secondary | ICD-10-CM | POA: Diagnosis present

## 2012-12-24 DIAGNOSIS — F1120 Opioid dependence, uncomplicated: Secondary | ICD-10-CM | POA: Diagnosis present

## 2012-12-24 MED ORDER — ATENOLOL 25 MG PO TABS
25.0000 mg | ORAL_TABLET | Freq: Every day | ORAL | Status: DC
  Filled 2012-12-24 (×2): qty 1
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
## 2012-12-24 NOTE — [facility] Suicide Risk Assessment (Signed)
Suicide Risk Assessment  Admission Assessment     Nursing information obtained from:  Patient Demographic factors:  Male;Caucasian;Unemployed;Low socioeconomic status;Divorced or widowed Current Mental Status:    General Appearance: Disheveled  Patent attorney:: Fair  Speech: Clear and Coherent  Volume: Normal  Mood: Depressed  Affect: ristricted  Thought Process: Goal Directed  Orientation: Full (Time, Place, and Person)  Thought Content: NA  Suicidal Thoughts: No  Homicidal Thoughts: No  Memory: Immediate; Poor  Judgement: Intact  Insight: Lacking  Psychomotor Activity: Decreased  Concentration: Fair  Recall: Fair  Akathisia: No    Loss Factors:  Decline in physical health;Financial problems / change in socioeconomic status Historical Factors:  Prior suicide attempts;Family history of suicide;Family history of mental illness or substance abuse Risk Reduction Factors:  Responsible for children under 82 years of age;Sense of responsibility to family;Positive social support  CLINICAL FACTORS:   Alcohol/Substance Abuse/Dependencies  COGNITIVE FEATURES THAT CONTRIBUTE TO RISK:  Closed-mindedness    SUICIDE RISK:   Mild:  Suicidal ideation of limited frequency, intensity, duration, and specificity.  There are no identifiable plans, no associated intent, mild dysphoria and related symptoms, good self-control (both objective and subjective assessment), few other risk factors, and identifiable protective factors, including available and accessible social support.  PLAN OF CARE:  AXIS I: Opiate dependence, benzodiazepine dependence, THC abuse SIMDO  AXIS II: Deferred  AXIS III:  Past Medical History   Diagnosis  Date   .  Chronic back pain    .  Degenerative disk disease    .  Hypertension    .  Seizures    .  Pilonidal cyst     AXIS IV: housing problems, occupational problems, problems related to social environment and problems with access to health care services  AXIS V:  41-50 serious symptoms  Plan:  Continue current meds  I certify that inpatient services furnished can reasonably be expected to improve the patient's condition.  Wonda Cerise 12/24/2012, 11:43 AM

## 2012-12-24 NOTE — Progress Notes (Signed)
Patient ID: Eduardo [name], male   DOB: 03-05-1971, 42 y.o.   MRN: [id number] D: Patient mood/affect sad and depressed. Cooperative with assessment. Pt. reports sleep was "fair". Appetite is "good". Pt rates depression  was "2" and hopelessness was "1" out of 10 scale. Pt stated on self inventory sheet after discharge "stop smoking pot and ciggs". Denies SI/HI/AVH. Offered no additional question or concerns.  A: Safety has been maintained with Q15 minutes observation. Supported and encouragement provided to attend groups.   R: Patient remains safe. He is complaint with medication and group programming. Safety has been maintained Q15 and continue current POC.

## 2012-12-24 NOTE — Progress Notes (Signed)
Patient ID: Eduardo [name], male   DOB: 07/17/1971, 42 y.o.   MRN: [id number] D. The patient complained that the medicine is making him feel too lethargic. Stated he has a funny lightheaded feeling. Concerned about taking the medication. States he is having a lot of nerve pain in his back radiating down his legs.  A. Met with patient 1:1. Reviewed medication. Educated regarding possible side-effects. Suggestions made to help alleviate pain. Administered medication. R. The patient attended and actively participated in evening group. He decided to refuse clonidine tonight to see if he felt better. Will discuss tomorrow with nurse in a.m.

## 2012-12-24 NOTE — Progress Notes (Signed)
[facility] Group Notes:  (Nursing/MHT/Case Management/Adjunct)  Date:  12/24/2012  Time:  4132GM Type of Therapy:  Psychoeducational Skills  Participation Level:  Active  Participation Quality:  Appropriate, Attentive, Sharing and Supportive  Affect:  Anxious, Appropriate, Depressed and Flat  Cognitive:  Alert, Appropriate and Oriented  Insight:  Appropriate, Improving and Limited  Engagement in Group:  Defensive, Developing/Improving, Limited and Supportive  Modes of Intervention:  Clarification, Discussion, Education and Support  Summary of Progress/Problems: Discussed goal setting and possible outcomes of negative behavior due to use/abuse of substances.  Pt. Engaged and discussed his feelings of hopelessness and helplessness in some detail.  Pixie Casino [facility] 12/24/2012, 5:13 PM

## 2012-12-25 DIAGNOSIS — F1210 Cannabis abuse, uncomplicated: Secondary | ICD-10-CM

## 2012-12-25 DIAGNOSIS — F1994 Other psychoactive substance use, unspecified with psychoactive substance-induced mood disorder: Secondary | ICD-10-CM

## 2012-12-25 MED ORDER — TRAZODONE HCL 50 MG PO TABS: 50.0000 mg | ORAL_TABLET | Freq: Every evening | ORAL | Status: DC | PRN

## 2012-12-25 NOTE — Progress Notes (Signed)
Patient ID: Eduardo [name], male   DOB: 1971/02/20, 42 y.o.   MRN: [id number] D)  Has been out and about this evening, happy he finally has some clothes.  Pleasant and cooperative this evening, states is feeling better, very little in the way of w/d sx.  States has DDD and would like to get that evaluated and surgery so that he doesn't have to deal with pain issues.  Attended group, compliant with meds, denies thoughts of self harm. A)  Will continue to monitor for safety, continue POC, encourage ment and sup0port. R)  Appreciative, receptive, compliant.  Safety maintained.

## 2012-12-25 NOTE — Progress Notes (Signed)
Adult Psychoeducational Group Note  Date:  12/25/2012 Time:  9:26 PM  Group Topic/Focus:  Wrap-Up Group:   The focus of this group is to help patients review their daily goal of treatment and discuss progress on daily workbooks.  Participation Level:  Active  Participation Quality:  Sharing  Affect:  Appropriate  Cognitive:  Oriented  Insight: Appropriate  Engagement in Group:  Engaged  Modes of Intervention:  Discussion  Additional Comments:  Patient shared that a great burden felt lifted from his shoulders when he practiced tell his sister the truth as she visited him today.  Trudi Morgenthaler, [place] Pigg 12/25/2012, 9:26 PM
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
## 2012-12-25 NOTE — Progress Notes (Signed)
D:  Patient up and active in the milieu.  States he does not know why he is on CIWA and COWs protocol, but UDS was positive for benzos, barbiturates, and THC.  States he would like to get back on his Percocet that he has not had since early March.   A:  Medications given as per orders.  Encouraged patient to be up and active throughout the day.  Encouraged him to stay on his scheduled taper.   R:  Pleasant and cooperative.  Interacting well with staff and peers.  Tolerating medications well.

## 2012-12-25 NOTE — Progress Notes (Signed)
Psychoeducational Group Note  Date:  12/25/2012 Time: 1015 Group Topic/Focus:  Making Healthy Choices:   The focus of this group is to help patients identify negative/unhealthy choices they were using prior to admission and identify positive/healthier coping strategies to replace them upon discharge.  Participation Level:  Active  Participation Quality:  Appropriate  Affect:  Appropriate  Cognitive:  Alert  Insight:  Engaged  Engagement in Group:  Engaged  Additional Comments:    Eduardo [name] 6:48 PM. 12/25/2012

## 2012-12-25 NOTE — Clinical Social Work Note (Signed)
[facility] Group Notes:  (Clinical Social Work)  12/25/2012  10:00-11:00AM  Summary of Progress/Problems:   The main focus of today's process group was to define "support" and describe what healthy supports are, then to identify the patient's current support system and decide on other supports that can be put in place to prevent future hospitalizations.   The patient expressed that right now his sister is his sole support, and he feels negatively toward her right now because she came to visit yesterday and told him they are selling his remaining guns, all he has left in the world personally, in order to pay for the hospital stay.  CSW told him re sponsorships, and to talk to Child psychotherapist tomorrow.  He stated he is reaching out to high school friends and starting to tell them of his struggles, is receiving support.  He wants to talk to his family about how he feels, but is afraid that he will be kicked out of the home, and it is the only place he has to go.  When CSW suggested that he talk with sister to get a plan together, he said the negative things he wants to discuss involve her husband who is "a powerful man."  Patient stated he definitely wants to add a therapist.  Type of Therapy:  Process Group with Motivational Interviewing  Participation Level:  Active  Participation Quality:  Appropriate, Attentive, Sharing and Supportive  Affect:  Blunted and Depressed  Cognitive:  Alert, Appropriate and Oriented  Insight:  Engaged  Engagement in Therapy:  Engaged  Modes of Intervention:  Clarification, Education, Limit-setting, Problem-solving, Socialization, Support and Processing, Exploration, Discussion, Role-Play   Ambrose Mantle, LCSW 12/25/2012, 11:03 AM

## 2012-12-25 NOTE — Progress Notes (Addendum)
Patient ID: Eduardo [name], male   DOB: 26-Apr-1971, 42 y.o.   MRN: [id number]   S:  Seen today. Reports poor sleep and thinks he has sad mood partly due to chronic pain. Wants meds for his long term depression anxiety. Reports having a lot of crying spells in the past. Per pt he never saw a mental health provider in the past for help. Per pt he will prefer to start opiod pain meds in the futre after he finds a provider.    General Appearance: Disheveled   Eye Solicitor:: Fair   Speech: Clear and Coherent   Volume: Normal   Mood: Depressed   Affect: ristricted  Thought Process: Goal Directed   Orientation: Full (Time, Place, and Person)   Thought Content: NA   Suicidal Thoughts: No   Homicidal Thoughts: No   Memory: Immediate; Poor   Judgement:fair  Insight: fair  Psychomotor Activity: fair  Concentration: Fair   Recall: Fair   Akathisia: No    Assessment:  AXIS I: Opiate dependence, benzodiazepine dependence, THC abuse SIMDO, r/o depressive d/o nos  AXIS II: Deferred  AXIS III:  Past Medical History   Diagnosis  Date   .  Chronic back pain    .  Degenerative disk disease    .  Hypertension    .  Seizures    .  Pilonidal cyst     AXIS IV: housing problems, occupational problems, problems related to social environment and problems with access to health care services  AXIS V: 41-50 serious symptoms   Treatment Plan/Recommendations:   1. Continue remeron for mood. Will consider increasing later.  2. Will add trazodone prn for sleep  2. Continue other meds

## 2012-12-26 DIAGNOSIS — F411 Generalized anxiety disorder: Secondary | ICD-10-CM | POA: Diagnosis present

## 2012-12-26 DIAGNOSIS — F329 Major depressive disorder, single episode, unspecified: Secondary | ICD-10-CM | POA: Diagnosis present

## 2012-12-26 DIAGNOSIS — F1120 Opioid dependence, uncomplicated: Principal | ICD-10-CM

## 2012-12-26 DIAGNOSIS — F1320 Sedative, hypnotic or anxiolytic dependence, uncomplicated: Secondary | ICD-10-CM

## 2012-12-26 MED ORDER — NICOTINE 14 MG/24HR TD PT24
14.0000 mg | MEDICATED_PATCH | Freq: Every day | TRANSDERMAL | Status: DC
  Administered 2012-12-26: 14 mg via TRANSDERMAL
  Filled 2012-12-26 (×4): qty 1

## 2012-12-26 MED ORDER — DULOXETINE HCL 30 MG PO CPEP
30.0000 mg | ORAL_CAPSULE | Freq: Every day | ORAL | Status: DC
  Administered 2012-12-26 – 2012-12-27 (×2): 30 mg via ORAL
  Filled 2012-12-26 (×5): qty 1

## 2012-12-26 MED ORDER — GABAPENTIN 300 MG PO CAPS
300.0000 mg | ORAL_CAPSULE | Freq: Three times a day (TID) | ORAL | Status: DC
  Administered 2012-12-26 – 2012-12-27 (×2): 300 mg via ORAL
  Filled 2012-12-26 (×5): qty 1

## 2012-12-26 NOTE — Progress Notes (Signed)
[facility] LCSW Group Therapy  12/26/2012 1:15 PM  Type of Therapy:  Group Therapy  Participation Level:  Active  Participation Quality:  Attentive and Sharing  Affect:  Appropriate  Cognitive:  Alert and Oriented  Insight:  Limited  Engagement in Therapy:  Engaged  Modes of Intervention:  Clarification, Discussion, Exploration, Reality Testing and Socialization  Summary of Progress/Problems:  Group discussion focused on what patient's see as their own obstacles to recovery.  Patient shares belief that nothing will be difficult to deal with due to his past experiences.  Everything I want to do I am able to accomplish.  Pt bercame somewhat angry and shut down when another group member commented "what are you doing here in that case?."   Eduardo [name] 12/26/2012, 5:51 PM

## 2012-12-26 NOTE — Tx Team (Signed)
Interdisciplinary Treatment Plan Update (Adult)  Date: 12/26/2012  Time Reviewed: 9:48 AM   Progress in Treatment:  Attending groups: Yes Participating in groups: Yes Taking medication as prescribed: Yes  Tolerating medication: Yes Family/Significant othe contact made: Not as yet Patient understands diagnosis: Yes Discussing patient identified problems/goals with staff: Yes  Medical problems stabilized or resolved: Yes Denies suicidal/homicidal ideation: Yes Patient has not harmed self or Others: Yes  New problem(s) identified: None Identified   Discharge Plan or Barriers: CSW is assessing for appropriate referrals.  Additional comments: [name]   Reason for Continuation of Hospitalization:  Depression  Medication stabilization  Withdrawal symptoms    Estimated length of stay: 2 days   For review of initial/current patient goals, please see plan of care.  Attendees:  Patient:    Family:    Physician: Geoffery Lyons  12/26/2012 9:48 AM   Nursing: 12/26/2012 9:48 AM   Clinical Social Worker Ronda Fairly  12/26/2012 9:48 AM   Other: Liliane Bade, Transiitional Care Coordinator  12/26/2012 9:48 AM   Other: Robbie Louis, RN  12/26/2012 9:48 AM   Other: Sherwood Gambler, [place] PA Student 12/26/2012 9:48 AM   Other:   12/26/2012 9:48 AM   Scribe for Treatment Team:  Carney Bern, LCSWA 12/26/2012 9:48 AM

## 2012-12-26 NOTE — Progress Notes (Signed)
Recreation Therapy Notes  Date: 03.10.2014 Time: 3:00pm  Location: [facility] Gym   Group Topic/Focus: Stress Managment   Participation Level:  Active   Participation Quality:  Appropriate  Affect:  Euthymic   Cognitive:  Appropriate     Additional Comments: Patient participated in stress management workshop. Patient rated stress level at a 8 out of 10 prior to session starting. Patient listened to recording of guided imagery script. Patient participated in progressive muscle relaxation and deep breathing delivered by LRT. Patient rated stress level at a 8 out of 10 at the end of the session.   During guided imagery script patient became agitated and was unable to complete the exercise. Patient later explained that the script used for guided imagery reminded him of the last time his mother was well so it had an opposite effect on him. LRT recommended patient find guided imagery script with a different story for future use.    Jearl Klinefelter, LRT/ CTRS        Jearl Klinefelter 12/26/2012 4:07 PM

## 2012-12-26 NOTE — Plan of Care (Signed)
Problem: Ineffective individual coping Goal: STG: Patient will participate in after care plan Outcome: Progressing Patient provided consent for CSW to provide suicide prevention education to his sister, Almyra Deforest and is beginning to talk about necessity for medication compliance  Problem: Alteration in mood Goal: LTG-Pt's behavior demonstrates decreased signs of depression (Patient's behavior demonstrates decreased signs of depression to the point the patient is safe to return home and continue treatment in an outpatient setting)  Outcome: Progressing On a scale of 1 to 10 with ten being the most ever experienced, the patient rates depression at a 4.     Problem: Alteration in mood & ability to function due to Goal: LTG-Patient demonstrates decreased signs of withdrawal (Patient demonstrates decreased signs of withdrawal to the point the patient is safe to return home and continue treatment in an outpatient setting)  Outcome: Progressing Patient recognizes need for abstinence regarding THC use.   Problem: Alteration in mood; excessive anxiety as evidenced by: Goal: LTG-Patient's behavior demonstrates decreased anxiety (Patient's behavior demonstrates anxiety and he/she is utilizing learned coping skills to deal with anxiety-producing situations)  Outcome: Progressing Patient attending groups and begining to discuss tools to use to deal with negative stressors

## 2012-12-26 NOTE — Progress Notes (Signed)
AA members did not arrive. Patients watched an AA video. Pt attended and was attentive.

## 2012-12-26 NOTE — Progress Notes (Signed)
[facility] Progress Note  12/26/2012 6:08 PM Eduardo [name]  MRN:  [id number] Subjective:  Eduardo [name] endorses persistent depression. He was taken off his pain medications when he tested positive for marijuana. He states that he stays in constant pain, mainly back radiating to his legs. Sates that he has not been able to work and he was turned down for disability. There was a hearing and he was sent for an evaluation. Things got worst before he was able to go for it. He admits to feeling hopeless, helpless. He still feels he wants to kill himself. Diagnosis:  Major Depression, Anxiety Disorder NOS, Opiate, Benzodiazepine dependence  ADL's:  Intact  Sleep: Poor  Appetite:  Fair  Suicidal Ideation:  Plan:  denies Intent:  denies Means:  denies Homicidal Ideation:  Plan:  denies Intent:  denies Means:  denies AEB (as evidenced by):  Psychiatric Specialty Exam: Review of Systems  Constitutional: Negative.   HENT: Positive for neck pain.   Eyes: Negative.   Respiratory: Negative.   Cardiovascular: Negative.   Gastrointestinal: Negative.   Genitourinary: Negative.   Musculoskeletal: Positive for myalgias and back pain.  Skin: Negative.   Neurological: Negative.   Endo/Heme/Allergies: Negative.   Psychiatric/Behavioral: Positive for depression and suicidal ideas. The patient is nervous/anxious and has insomnia.     Blood pressure 137/93, pulse 80, temperature 98.1 F (36.7 C), temperature source Oral, resp. rate 18, height 6\' 4"  (1.93 m), weight 123.378 kg (272 lb).Body mass index is 33.12 kg/(m^2).  General Appearance: Fairly Groomed  Patent attorney::  Minimal  Speech:  Clear and Coherent  Volume:  Normal  Mood:  Anxious, Depressed, Hopeless, Irritable and Worthless  Affect:  Depressed and Tearful  Thought Process:  Coherent and Goal Directed  Orientation:  Full (Time, Place, and Person)  Thought Content:  somaitc complains, worries, concerns  Suicidal Thoughts:  Yes.  without  intent/plan  Homicidal Thoughts:  No  Memory:  Immediate;   Fair Recent;   Fair Remote;   Fair  Judgement:  Fair  Insight:  Present  Psychomotor Activity:  Restlessness  Concentration:  Fair  Recall:  Fair  Akathisia:  No  Handed:  Right  AIMS (if indicated):     Assets:  Desire for Improvement  Sleep:  Number of Hours: 6.25   Current Medications: Current Facility-Administered Medications  Medication Dose Route Frequency Provider Last Rate Last Dose  . acetaminophen (TYLENOL) tablet 650 mg  650 mg Oral Q6H PRN Shuvon Rankin, NP   650 mg at 12/26/12 1201  . alum & mag hydroxide-simeth (MAALOX/MYLANTA) 200-200-20 MG/5ML suspension 30 mL  30 mL Oral Q4H PRN Shuvon Rankin, NP      . atenolol (TENORMIN) tablet 25 mg  25 mg Oral Daily Shuvon Rankin, NP   25 mg at 12/26/12 0802  . chlordiazePOXIDE (LIBRIUM) capsule 25 mg  25 mg Oral Q6H PRN Shuvon Rankin, NP      . chlordiazePOXIDE (LIBRIUM) capsule 25 mg  25 mg Oral BH-qamhs Shuvon Rankin, NP       Followed by  . [START ON 12/27/2012] chlordiazePOXIDE (LIBRIUM) capsule 25 mg  25 mg Oral Daily Shuvon Rankin, NP      . cloNIDine (CATAPRES) tablet 0.1 mg  0.1 mg Oral BH-qamhs Shuvon Rankin, NP       Followed by  . [START ON 12/28/2012] cloNIDine (CATAPRES) tablet 0.1 mg  0.1 mg Oral QAC breakfast Shuvon Rankin, NP      . dicyclomine (BENTYL) tablet 20 mg  20 mg Oral Q6H PRN Shuvon Rankin, NP      . DULoxetine (CYMBALTA) DR capsule 30 mg  30 mg Oral Daily Rachael Fee, MD   30 mg at 12/26/12 1553  . gabapentin (NEURONTIN) capsule 300 mg  300 mg Oral TID Rachael Fee, MD   300 mg at 12/26/12 1553  . hydrOXYzine (ATARAX/VISTARIL) tablet 25 mg  25 mg Oral Q6H PRN Shuvon Rankin, NP   25 mg at 12/25/12 2137  . hydrOXYzine (ATARAX/VISTARIL) tablet 25 mg  25 mg Oral Q6H PRN Shuvon Rankin, NP   25 mg at 12/24/12 2255  . loperamide (IMODIUM) capsule 2-4 mg  2-4 mg Oral PRN Shuvon Rankin, NP      . magnesium hydroxide (MILK OF MAGNESIA) suspension 30  mL  30 mL Oral Daily PRN Shuvon Rankin, NP      . methocarbamol (ROBAXIN) tablet 500 mg  500 mg Oral Q8H PRN Shuvon Rankin, NP   500 mg at 12/26/12 0805  . mirtazapine (REMERON) tablet 15 mg  15 mg Oral QHS Shuvon Rankin, NP   15 mg at 12/25/12 2141  . multivitamin with minerals tablet 1 tablet  1 tablet Oral Daily Shuvon Rankin, NP   1 tablet at 12/26/12 0801  . naproxen (NAPROSYN) tablet 500 mg  500 mg Oral BID PRN Shuvon Rankin, NP   500 mg at 12/26/12 0805  . nicotine (NICODERM CQ - dosed in mg/24 hours) patch 14 mg  14 mg Transdermal Daily Verne Spurr, PA-C   14 mg at 12/26/12 0802  . ondansetron (ZOFRAN-ODT) disintegrating tablet 4 mg  4 mg Oral Q6H PRN Shuvon Rankin, NP      . thiamine (B-1) injection 100 mg  100 mg Intramuscular Once Shuvon Rankin, NP      . thiamine (VITAMIN B-1) tablet 100 mg  100 mg Oral Daily Shuvon Rankin, NP   100 mg at 12/26/12 0801  . traZODone (DESYREL) tablet 50 mg  50 mg Oral QHS PRN Wonda Cerise, MD        Lab Results: No results found for this or any previous visit (from the past 48 hour(s)).  Physical Findings: AIMS: Facial and Oral Movements Muscles of Facial Expression: None, normal Lips and Perioral Area: None, normal Jaw: None, normal Tongue: None, normal,Extremity Movements Upper (arms, wrists, hands, fingers): None, normal Lower (legs, knees, ankles, toes): None, normal, Trunk Movements Neck, shoulders, hips: None, normal, Overall Severity Severity of abnormal movements (highest score from questions above): None, normal Incapacitation due to abnormal movements: None, normal Patient's awareness of abnormal movements (rate only patient's report): No Awareness, Dental Status Current problems with teeth and/or dentures?: No Does patient usually wear dentures?: No  CIWA:  CIWA-Ar Total: 2 COWS:  COWS Total Score: 0  Treatment Plan Summary: Daily contact with patient to assess and evaluate symptoms and progress in treatment Medication  management  Plan: Supportive approach/coping skills            Will start Cymbalta 30 mg daily                             Neurontin 100 mg TID (with plans to increase as he tolertaes it)  Medical Decision Making Problem Points:  Review of psycho-social stressors (1) Data Points:  Review of medication regiment & side effects (2) Review of new medications or change in dosage (2)  I certify that inpatient services furnished can reasonably be expected  to improve the patient's condition.   Waino Mounsey A 12/26/2012, 6:08 PM

## 2012-12-26 NOTE — Progress Notes (Signed)
Nutrition Brief Note  Pt meets criteria for severe MALNUTRITION in the context of chronic illness as evidenced by <75% estimated energy intake with 8.7% weight loss in the past month.  Recommend MD order Protonix and Zantac if appropriate as pt was told a few days PTA by an MD that he needs to start taking them but could not afford them. Recommend social work consult to help with this issue.   Patient identified on the Malnutrition Screening Tool (MST) Report  Body mass index is 33.12 kg/(m^2). Patient meets criteria for class I obesity based on current BMI.   Wt Readings from Last 10 Encounters:  12/23/12 272 lb (123.378 kg)  11/22/12 298 lb 9.6 oz (135.444 kg)  04/01/12 270 lb (122.471 kg)  09/09/10 325 lb (147.419 kg)    Pt reports typically eating 2 meals/day PTA. Pt reports his weight fluctuated PTA. Pt reports following low sodium diet at home. Pt reports he does not use salt and uses Mrs. Dash. Pt reports having stomach problems with possible reflux. Pt reports acidic foods give him pain. Pt reports he has trouble eating spaghetti with sauce due to the acidity so he compensates by adding brown sugar to the spaghetti. Pt denied any nutrition educational needs.    No nutrition interventions warranted at this time. If nutrition issues arise, please consult RD.    Levon Hedger MS, RD, LDN [phone] Pager [phone] After Hours Pager

## 2012-12-26 NOTE — Progress Notes (Signed)
[facility] LCSW Aftercare Discharge Planning Group Note  12/26/2012   Participation Quality:  Appropriate  Affect:  Appropriate  Cognitive:  Appropriate  Insight:  Limited  Engagement in Group:  Limited  Modes of Intervention:  Clarification, Exploration, Rapport Building and Support  Summary of Progress/Problems: Pt denies both suicidal and homicidal ideation.  On a scale of 1 to 10 with ten being the most ever experienced, the patient rates depression at a 4 and anxiety at a 2. Patient reports it was his idea to be here, was discharged from pain clinic due to dirty test and has lost much home, income and work,  Wants help with disability, money housing.  Ashby Dawes of crisis management services provided to patient.    [name]

## 2012-12-27 MED ORDER — NICOTINE 7 MG/24HR TD PT24
7.0000 mg | MEDICATED_PATCH | Freq: Every day | TRANSDERMAL | Status: DC
  Administered 2012-12-27 – 2012-12-31 (×5): 7 mg via TRANSDERMAL
  Filled 2012-12-27 (×7): qty 1

## 2012-12-27 MED ORDER — GABAPENTIN 400 MG PO CAPS: 400.0000 mg | ORAL_CAPSULE | Freq: Every day | ORAL | Status: DC

## 2012-12-27 MED ORDER — GABAPENTIN 400 MG PO CAPS
400.0000 mg | ORAL_CAPSULE | Freq: Four times a day (QID) | ORAL | Status: DC
  Administered 2012-12-27 (×2): 400 mg via ORAL
  Filled 2012-12-27 (×9): qty 1

## 2012-12-27 MED ORDER — DULOXETINE HCL 60 MG PO CPEP
60.0000 mg | ORAL_CAPSULE | Freq: Every day | ORAL | Status: DC
Start: 2012-12-28 — End: 2012-12-31
  Administered 2012-12-28 – 2012-12-31 (×4): 60 mg via ORAL
  Filled 2012-12-27 (×3): qty 1
  Filled 2012-12-27: qty 14
  Filled 2012-12-27 (×3): qty 1

## 2012-12-27 MED ORDER — GABAPENTIN 400 MG PO CAPS
400.0000 mg | ORAL_CAPSULE | Freq: Every day | ORAL | Status: AC
  Administered 2012-12-27: 400 mg via ORAL
  Filled 2012-12-27 (×2): qty 1

## 2012-12-27 NOTE — Progress Notes (Signed)
Pt has been up in groups and participating appropriately thus far.  He claimed he took 2 hours to fall to sleep and woke up at 0400 with night sweats.  He did talk with Dr. Dub Mikes so he is aware.  Dr. Dub Mikes did make some medication changes to increase his gabapentin to 400 TID and HS and increase his cymbalta to 60 mg to start tomorrow.  Pt aware and voiced understanding.  He rated his depression a 2 hopelessness a 1 and denied any anxiety on his self-inventory.  He denies any S/H ideation or A/V hallucinations.  He is unsure of his f/u but wants to go to out patient from here.

## 2012-12-27 NOTE — Progress Notes (Signed)
Patient ID: Eduardo [name], male   DOB: 10/06/1971, 42 y.o.   MRN: [id number]  D: Pt informed the writer that he's been depressed for several yrs. 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 Pt is attempting to get disability, was denied once but another hearing was scheduled for 12/23/12. Pt still feeling hopeless and his stepmother to consider letting him stay in the home she had prior to marrying his father.  Stated, "I need to start taking care of myself again". Writer asked pt how or if he expects to pay bills at the house. "I make some good beef jerky and she has a large counter".  Pt says he'll attempt to make jerky to sell and pay bills.   A:  Support and encouragement was offered. 15 min checks continued for safety.  R: Pt remains safe.

## 2012-12-27 NOTE — Progress Notes (Signed)
[facility] MD Progress Note  12/27/2012 6:25 PM Eduardo [name]  MRN:  [id number] Subjective:  Eduardo [name] endorses that he continues to have a hard time. He states that the medications are not addressing the pain as much as he would like. He shares how after he started having problems with his back, his life started going down hill. He has only done physical work.The death of his mother Bonita Quin has been an event that he has had a hard time dealing with. He does not live far from the grave. States that even today when he sees the grave he cries. He would like to move to his sister's vacant house as it is farther away and in that way he does not have to see it. He has ADHD, tested and diagnosed when he was younger. States that he used Adderall successfully in the past. He cant concentrate, gets distracted very easily, does not finish work. This has affected his ability to pursue other career/vocational paths. Not sure what he would do if disability is denied Diagnosis:  Major Depression, GAD, Benzodiazepine, Opiate Dependence  ADL's:  Intact  Sleep: Fair  Appetite:  Fair  Suicidal Ideation:  Plan:  denies Intent:  denies Means:  denies Homicidal Ideation:  Plan:  denies Intent:  denies Means:  denies AEB (as evidenced by):  Psychiatric Specialty Exam: Review of Systems  Constitutional: Negative.   HENT: Negative.   Eyes: Negative.   Respiratory: Negative.   Cardiovascular: Negative.   Gastrointestinal: Negative.   Genitourinary: Negative.   Musculoskeletal: Positive for myalgias, back pain and joint pain.  Skin: Negative.   Neurological: Negative.   Endo/Heme/Allergies: Negative.   Psychiatric/Behavioral: Positive for depression and suicidal ideas. The patient is nervous/anxious and has insomnia.     Blood pressure 125/84, pulse 86, temperature 97.8 F (36.6 C), temperature source Oral, resp. rate 20, height 6\' 4"  (1.93 m), weight 123.378 kg (272 lb).Body mass index is 33.12 kg/(m^2).   General Appearance: Fairly Groomed  Patent attorney::  Fair  Speech:  Clear and Coherent and Slow  Volume:  Decreased  Mood:  Anxious, Depressed and in pain  Affect:  Restricted  Thought Process:  Coherent and Goal Directed  Orientation:  Full (Time, Place, and Person)  Thought Content:  Rumination  Suicidal Thoughts:  Yes.  without intent/plan fleeting  Homicidal Thoughts:  No  Memory:  Immediate;   Fair Recent;   Fair Remote;   Fair  Judgement:  Fair  Insight:  Present  Psychomotor Activity:  Restlessness  Concentration:  Fair  Recall:  Fair  Akathisia:  No  Handed:  Right  AIMS (if indicated):     Assets:  Desire for Improvement  Sleep:  Number of Hours: 5.25   Current Medications: Current Facility-Administered Medications  Medication Dose Route Frequency Provider Last Rate Last Dose  . acetaminophen (TYLENOL) tablet 650 mg  650 mg Oral Q6H PRN Shuvon Rankin, NP   650 mg at 12/26/12 1201  . alum & mag hydroxide-simeth (MAALOX/MYLANTA) 200-200-20 MG/5ML suspension 30 mL  30 mL Oral Q4H PRN Shuvon Rankin, NP      . atenolol (TENORMIN) tablet 25 mg  25 mg Oral Daily Shuvon Rankin, NP   25 mg at 12/27/12 0810  . dicyclomine (BENTYL) tablet 20 mg  20 mg Oral Q6H PRN Shuvon Rankin, NP      . DULoxetine (CYMBALTA) DR capsule 30 mg  30 mg Oral Daily Rachael Fee, MD   30 mg at 12/27/12 4098  .  gabapentin (NEURONTIN) capsule 400 mg  400 mg Oral QID Rachael Fee, MD   400 mg at 12/27/12 1705  . hydrOXYzine (ATARAX/VISTARIL) tablet 25 mg  25 mg Oral Q6H PRN Shuvon Rankin, NP   25 mg at 12/25/12 2137  . loperamide (IMODIUM) capsule 2-4 mg  2-4 mg Oral PRN Shuvon Rankin, NP      . magnesium hydroxide (MILK OF MAGNESIA) suspension 30 mL  30 mL Oral Daily PRN Shuvon Rankin, NP      . methocarbamol (ROBAXIN) tablet 500 mg  500 mg Oral Q8H PRN Shuvon Rankin, NP   500 mg at 12/27/12 1403  . mirtazapine (REMERON) tablet 15 mg  15 mg Oral QHS Shuvon Rankin, NP   15 mg at 12/26/12 2124  .  multivitamin with minerals tablet 1 tablet  1 tablet Oral Daily Shuvon Rankin, NP   1 tablet at 12/27/12 0810  . naproxen (NAPROSYN) tablet 500 mg  500 mg Oral BID PRN Shuvon Rankin, NP   500 mg at 12/27/12 1403  . nicotine (NICODERM CQ - dosed in mg/24 hr) patch 7 mg  7 mg Transdermal Q0600 Rachael Fee, MD   7 mg at 12/27/12 0825  . ondansetron (ZOFRAN-ODT) disintegrating tablet 4 mg  4 mg Oral Q6H PRN Shuvon Rankin, NP      . thiamine (B-1) injection 100 mg  100 mg Intramuscular Once Shuvon Rankin, NP      . thiamine (VITAMIN B-1) tablet 100 mg  100 mg Oral Daily Shuvon Rankin, NP   100 mg at 12/27/12 0810  . traZODone (DESYREL) tablet 50 mg  50 mg Oral QHS PRN Wonda Cerise, MD        Lab Results: No results found for this or any previous visit (from the past 48 hour(s)).  Physical Findings: AIMS: Facial and Oral Movements Muscles of Facial Expression: None, normal Lips and Perioral Area: None, normal Jaw: None, normal Tongue: None, normal,Extremity Movements Upper (arms, wrists, hands, fingers): None, normal Lower (legs, knees, ankles, toes): None, normal, Trunk Movements Neck, shoulders, hips: None, normal, Overall Severity Severity of abnormal movements (highest score from questions above): None, normal Incapacitation due to abnormal movements: None, normal Patient's awareness of abnormal movements (rate only patient's report): No Awareness, Dental Status Current problems with teeth and/or dentures?: No Does patient usually wear dentures?: No  CIWA:  CIWA-Ar Total: 2 COWS:  COWS Total Score: 0  Treatment Plan Summary: Daily contact with patient to assess and evaluate symptoms and progress in treatment Medication management  Plan: Supportive approach/coping skills/relapse prevention           Increase the Neurontin to 400 mg TID and HS           Increase the Cymbalta to 60 mg daily  Medical Decision Making Problem Points:  Review of last therapy session (1) and Review of  psycho-social stressors (1) Data Points:  Review of medication regiment & side effects (2)  I certify that inpatient services furnished can reasonably be expected to improve the patient's condition.   Brendon Christoffel A 12/27/2012, 6:25 PM

## 2012-12-27 NOTE — Progress Notes (Signed)
Recreation Therapy Notes  Date: 03.11.2014 Time:3:00pm Location: [facility]      Group Topic/Focus: Goal Setting  Participation Level: Active  Participation Quality: Sharing  Affect: Euthymic  Cognitive: Appropriate  Additional Comments: Patient filled out Coat of Arms. Patient identified the following components of his Coat of Arms: My best quality, Something I am good at, Something I want to try, An obstacle I have overcome, A relationship I would like to rebuild, Something I want to accomplish in the next year. Patient chose to share "Something you want to accomplish in the next year." Patient stated "get my own place." Patient spoke about going long periods of time without speaking to his daughters. Patient spoke about being an excellent marksman. Patient participate in group discussion about setting goals and how that can effect recovery.   Marykay Lex Cj Beecher, LRT/CTRS   Jearl Klinefelter 12/27/2012 4:49 PM

## 2012-12-27 NOTE — Progress Notes (Signed)
Pt attended AA group; was attentive and supportive of peers.

## 2012-12-27 NOTE — Progress Notes (Signed)
[facility] LCSW Group Therapy - Late Entry  12/26/2012 1:15 PM  Type of Therapy:  Group Therapy  Participation Level:  Active  Participation Quality:  Appropriate  Affect:  Anxious and Depressed  Cognitive:  Alert and Oriented  Insight:  Limited  Engagement in Therapy:  Developing/Improving  Modes of Intervention:  Discussion, Exploration, Rapport Building and Support  Summary of Progress/Problems:  Group discussion focused on what patient's see as their own obstacles to recovery.  Patient shares belief that having his basic needs met will be an obstacle as family has declined to help and he has lost resources.  Patient is applying for disability and others in group shared experience and support about that process.    [name] 12/27/2012, 1:54 PM

## 2012-12-27 NOTE — Progress Notes (Signed)
Patient ID: Eduardo [name], male   DOB: 1971-10-13, 42 y.o.   MRN: [id number]  D:  When asked about his day, pt informed the writer that he was feeling better today. Stated, "It's not the drugs, it's the depression I need help with".  However, pt wouldn't go into more detail. Pt voiced no questions or concerns.  A:  Support and encouragement was offered. 15 min checks continued for safety.  R: Pt remains safe.

## 2012-12-27 NOTE — Progress Notes (Addendum)
[facility] LCSW Aftercare Discharge Planning Group Note  12/27/2012  Participation Quality:  Appropriate  Affect:  Appropriate  Cognitive:  Alert and Oriented  Insight:  Improving  Engagement in Group:  Improving  Modes of Intervention:  Discussion, Exploration and Support  Summary of Progress/Problems: Pt denies both suicidal and homicidal ideation.  On a scale of 1 to 10 with ten being the most ever experienced, the patient rates depression at a 3 and anxiety at a 0. Patient reports he is interested in exploring living once again at step mother's home which is vacant as she lives with patient's father. Patient will follow up in [place] at [place] and will be given information as to how to contact Hospice for services.  Suicide prevention was provided to patients after group. Writer provided suicide prevention education directly to patient; conversation included risk factors, warning signs and resources to contact for help. Mobile crisis services explained and contact card placed in chart for pt to receive at discharge.   [name] 12/27/2012, 12:50 PM

## 2012-12-28 MED ORDER — LIDOCAINE 5 % EX PTCH
1.0000 | MEDICATED_PATCH | CUTANEOUS | Status: DC
  Administered 2012-12-28 – 2012-12-29 (×2): 1 via TRANSDERMAL
  Filled 2012-12-28 (×6): qty 1

## 2012-12-28 MED ORDER — GABAPENTIN 400 MG PO CAPS: 400.0000 mg | ORAL_CAPSULE | Freq: Every day | ORAL | Status: DC

## 2012-12-28 MED ORDER — METHOCARBAMOL 500 MG PO TABS
500.0000 mg | ORAL_TABLET | Freq: Three times a day (TID) | ORAL | Status: DC | PRN
  Administered 2012-12-28: 500 mg via ORAL

## 2012-12-28 MED ORDER — GABAPENTIN 400 MG PO CAPS
400.0000 mg | ORAL_CAPSULE | Freq: Four times a day (QID) | ORAL | Status: DC
  Administered 2012-12-28 – 2012-12-29 (×4): 400 mg via ORAL
  Filled 2012-12-28 (×9): qty 1

## 2012-12-28 NOTE — Progress Notes (Signed)
Patient ID: Eduardo [name], male   DOB: Jul 03, 1971, 42 y.o.   MRN: [id number] [facility] MD Progress Note  12/28/2012 11:46 AM Eduardo [name]  MRN:  [id number]  Subjective:  Eduardo [name] endorses that he continues to have a hard time. He reports, "I'm hurting, I mean my back pain is not letting me relax. I was being prescribed percocet for my pain, but I was giving it away to a family member. This family member was helping me pay for it. I thought giving him some of the pills was a way to pay him back for helping me. Then after that, I realized that I did not have enough pills left for my back pain. Then, I started supplementing with weed, to numb my pain. Unfortunately, I was randomly tested for drugs at work and I failed. I got in trouble for it. I have other issues that I am dealing with too. I have not been able to deal with my mother's death. She died of diabetes complications. I live very close to where she was buried, and when I look out of the window, I see her gave site very clearly, and I don't know how to deal with that".  Diagnosis:  Major Depression, GAD, Benzodiazepine, Opiate Dependence  ADL's:  Intact  Sleep: Fair  Appetite:  Fair  Suicidal Ideation:  Plan:  denies Intent:  denies Means:  denies Homicidal Ideation:  Plan:  denies Intent:  denies Means:  denies  AEB (as evidenced by): per patient's reports.  Psychiatric Specialty Exam: Review of Systems  Constitutional: Negative.   HENT: Negative.   Eyes: Negative.   Respiratory: Negative.   Cardiovascular: Negative.   Gastrointestinal: Negative.   Genitourinary: Negative.   Musculoskeletal: Positive for myalgias, back pain and joint pain.  Skin: Negative.   Neurological: Negative.   Endo/Heme/Allergies: Negative.   Psychiatric/Behavioral: Positive for depression and suicidal ideas. The patient is nervous/anxious and has insomnia.     Blood pressure 118/74, pulse 87, temperature 97.5 F (36.4 C), temperature source  Oral, resp. rate 20, height 6\' 4"  (1.93 m), weight 123.378 kg (272 lb).Body mass index is 33.12 kg/(m^2).  General Appearance: Fairly Groomed  Patent attorney::  Fair  Speech:  Clear and Coherent and Slow  Volume:  Decreased  Mood:  Anxious, Depressed and in pain  Affect:  Restricted  Thought Process:  Coherent and Goal Directed  Orientation:  Full (Time, Place, and Person)  Thought Content:  Rumination  Suicidal Thoughts:  Yes.  without intent/plan fleeting thoughts  Homicidal Thoughts:  No  Memory:  Immediate;   Fair Recent;   Fair Remote;   Fair  Judgement:  Fair  Insight:  Present  Psychomotor Activity:  Restlessness  Concentration:  Fair  Recall:  Fair  Akathisia:  No  Handed:  Right  AIMS (if indicated):     Assets:  Desire for Improvement  Sleep:  Number of Hours: 5.75   Current Medications: Current Facility-Administered Medications  Medication Dose Route Frequency Provider Last Rate Last Dose  . acetaminophen (TYLENOL) tablet 650 mg  650 mg Oral Q6H PRN Shuvon Rankin, NP   650 mg at 12/26/12 1201  . alum & mag hydroxide-simeth (MAALOX/MYLANTA) 200-200-20 MG/5ML suspension 30 mL  30 mL Oral Q4H PRN Shuvon Rankin, NP      . atenolol (TENORMIN) tablet 25 mg  25 mg Oral Daily Shuvon Rankin, NP   25 mg at 12/28/12 0755  . dicyclomine (BENTYL) tablet 20 mg  20 mg Oral  Q6H PRN Shuvon Rankin, NP      . DULoxetine (CYMBALTA) DR capsule 60 mg  60 mg Oral Daily Rachael Fee, MD   60 mg at 12/28/12 0755  . gabapentin (NEURONTIN) capsule 400 mg  400 mg Oral QID Rachael Fee, MD   400 mg at 12/28/12 1123  . hydrOXYzine (ATARAX/VISTARIL) tablet 25 mg  25 mg Oral Q6H PRN Shuvon Rankin, NP   25 mg at 12/25/12 2137  . lidocaine (LIDODERM) 5 % 1 patch  1 patch Transdermal Q24H Sanjuana Kava, NP      . loperamide (IMODIUM) capsule 2-4 mg  2-4 mg Oral PRN Shuvon Rankin, NP      . magnesium hydroxide (MILK OF MAGNESIA) suspension 30 mL  30 mL Oral Daily PRN Shuvon Rankin, NP      .  methocarbamol (ROBAXIN) tablet 500 mg  500 mg Oral Q8H PRN Shuvon Rankin, NP   500 mg at 12/27/12 2214  . mirtazapine (REMERON) tablet 15 mg  15 mg Oral QHS Shuvon Rankin, NP   15 mg at 12/27/12 2213  . multivitamin with minerals tablet 1 tablet  1 tablet Oral Daily Shuvon Rankin, NP   1 tablet at 12/28/12 0755  . naproxen (NAPROSYN) tablet 500 mg  500 mg Oral BID PRN Shuvon Rankin, NP   500 mg at 12/27/12 1403  . nicotine (NICODERM CQ - dosed in mg/24 hr) patch 7 mg  7 mg Transdermal Q0600 Rachael Fee, MD   7 mg at 12/28/12 0631  . ondansetron (ZOFRAN-ODT) disintegrating tablet 4 mg  4 mg Oral Q6H PRN Shuvon Rankin, NP      . thiamine (B-1) injection 100 mg  100 mg Intramuscular Once Shuvon Rankin, NP      . thiamine (VITAMIN B-1) tablet 100 mg  100 mg Oral Daily Shuvon Rankin, NP   100 mg at 12/28/12 0755  . traZODone (DESYREL) tablet 50 mg  50 mg Oral QHS PRN Wonda Cerise, MD        Lab Results: No results found for this or any previous visit (from the past 48 hour(s)).  Physical Findings: AIMS: Facial and Oral Movements Muscles of Facial Expression: None, normal Lips and Perioral Area: None, normal Jaw: None, normal Tongue: None, normal,Extremity Movements Upper (arms, wrists, hands, fingers): None, normal Lower (legs, knees, ankles, toes): None, normal, Trunk Movements Neck, shoulders, hips: None, normal, Overall Severity Severity of abnormal movements (highest score from questions above): None, normal Incapacitation due to abnormal movements: None, normal Patient's awareness of abnormal movements (rate only patient's report): No Awareness, Dental Status Current problems with teeth and/or dentures?: No Does patient usually wear dentures?: No  CIWA:  CIWA-Ar Total: 2 COWS:  COWS Total Score: 0  Treatment Plan Summary: Daily contact with patient to assess and evaluate symptoms and progress in treatment Medication management  Plan: Supportive approach/coping skills/relapse  prevention. Will initiate Lidocaine patch to lower back areas Q 24 hours for symptoms of pain. Encouraged out of room, participation in group sessions and application of coping skills when distressed. Will continue to monitor response to/adverse effects of medications in use to assure effectiveness. Continue to monitor mood, behavior and interaction with staff and other patients. Continue current plan of care.  Medical Decision Making Problem Points:  Review of last therapy session (1) and Review of psycho-social stressors (1) Data Points:  Review of medication regiment & side effects (2)  I certify that inpatient services furnished can reasonably be expected to  improve the patient's condition.   Armandina Stammer I 12/28/2012, 11:46 AM

## 2012-12-28 NOTE — Progress Notes (Signed)
[facility] INPATIENT:  Family/Significant Other Suicide Prevention Education  Suicide Prevention Education:  Education Completed; Almyra Deforest (Sister) [phone], has been identified by the patient as the family member/significant other with whom the patient will be residing, and identified as the person(s) who will aid the patient in the event of a mental health crisis (suicidal ideations/suicide attempt).  With written consent from the patient, the family member/significant other has been provided the following suicide prevention education, prior to the and/or following the discharge of the patient.  The suicide prevention education provided includes the following:  Suicide risk factors  Suicide prevention and interventions  National Suicide Hotline telephone number  [facility] assessment telephone number  [facility] Emergency Assistance 911  [facility] and/or Residential Mobile Crisis Unit telephone number  Request made of family/significant other to:  Remove weapons (e.g., guns, rifles, knives), all items previously/currently identified as safety concern.    Remove drugs/medications (over-the-counter, prescriptions, illicit drugs), all items previously/currently identified as a safety concern.  The family member/significant other verbalizes understanding of the suicide prevention education information provided.  The family member/significant other agrees to remove the items of safety concern listed above.  Jerad Dunlap L 12/28/2012, 11:20 AM

## 2012-12-28 NOTE — Tx Team (Addendum)
Interdisciplinary Treatment Plan Update (Adult)  Date: 12/28/2012  Time Reviewed: 9:48 AM   Progress in Treatment:  Attending groups: Yes Participating in groups: Yes Taking medication as prescribed: Yes  Tolerating medication: Yes Family/Significant othe contact made: Yes. Contact made with sister, Almyra Deforest. Patient understands diagnosis: Yes Discussing patient identified problems/goals with staff: Yes  Medical problems stabilized or resolved: Yes Denies suicidal/homicidal ideation: Yes Patient has not harmed self or Others: Yes  New problem(s) identified: None Identified   Discharge Plan or Barriers: CSW is assessing for appropriate referrals.  Additional comments: Pt given lidocaine patch for back pain. Referral to be made for therapy and pain management.  Pt rates depression at 3/4 which he states is reduced from his admission.  Pt maintains depressed and flat affect.  Reason for Continuation of Hospitalization:  Depression  Medication stabilization     Estimated length of stay: 2 days   For review of initial/current patient goals, please see plan of care.  Attendees:  Patient:    Family:    Physician: Geoffery Lyons  12/26/2012 9:48 AM   Nursing: 12/26/2012 9:48 AM      Other: Foye Clock, MSW Intern  12/26/2012 9:48 AM   Other: Robbie Louis, RN  12/26/2012 9:48 AM   Other: Sherwood Gambler, [place] PA Student 12/26/2012 9:48 AM   Other:   12/26/2012 9:48 AM   Scribe for Treatment Team:  Rico Ala 12/28/2012 4:25 PM

## 2012-12-28 NOTE — Progress Notes (Signed)
[facility] LCSW Group Therapy  Emotion Regulation    12/28/2012 2:26 PM   Type of Therapy: Group Therapy   Participation Level: Active   Participation Quality: Appropriate and Attentive   Affect: Depressed and Flat  Cognitive: Alert and Appropriate   Insight: Developing/Improving   Engagement in Therapy: Engaged   Modes of Intervention: Discussion, Education, Rapport Building and Support   Summary of Progress/Problems:Todays group topic was Emotion Regulation. Pts participated in activity where they chose two photographs provided by SW Intern, one that represented a positive emotion and one that represented a negative emotion. Pts processed ways to cope with negative emotions and behaviors that they could engage in to evoke their positive emotions.   Pt attended group his affect has flat and depressed.  He was open with his disclosures and participated during group discussion.  Pt chose a photograph of the beach as his positive emotional picture.  He processed that he has happy memories of his mother and his family going to the beach.  He chose a photo of a barn that was locked as negative.  He connected this photo to memories of his grandfather having to sell his farm.  Pt processed that memories are powerful tools for regulating his emotions.  He recognized that he can use his positive memories as ways to help regulate his emotions.  He identified recalling positive memories of his mother and other happy times in his life as a way to regulate himself when he begins to become overwhelmed.

## 2012-12-28 NOTE — Progress Notes (Signed)
Adult Psychoeducational Group Note  Date:  12/28/2012 Time:  8:39 PM  Group Topic/Focus:  AA group  Participation Level:  Active  Participation Quality:  Appropriate  Affect:  Appropriate  Cognitive:  Alert  Insight: Appropriate  Engagement in Group:  Improving  Modes of Intervention:  Discussion  Additional Comments:    Flonnie Hailstone 12/28/2012, 8:39 PM

## 2012-12-28 NOTE — Progress Notes (Signed)
Date: 12/28/2012  Time: 11:00am  Group Topic/Focus:  Personal Choices and Values: The focus of this group is to help patients assess and explore the importance of values in their lives, how their values affect their decisions, how they express their values and what opposes their expression.  Participation Level: Active  Participation Quality: Appropriate, Sharing and Supportive  Affect: Appropriate  Cognitive: Appropriate  Insight: Appropriate  Engagement in Group: Engaged and Supportive  Modes of Intervention: Education and Support  Additional Comments: Pt was involved, was appropriate.  Isla Pence M  12/28/2012, 12:57 PM

## 2012-12-28 NOTE — Progress Notes (Signed)
Patient ID: Eduardo [name], male   DOB: 1971-02-08, 42 y.o.   MRN: [id number] He has been up and to part of the AM groups. He has been ordered neurontin standing order for his pain and was given, afterward he went to lay down. Self inventory: depression 2, hopelessness 2,denies SI.

## 2012-12-28 NOTE — Progress Notes (Signed)
[facility] LCSW Aftercare Discharge Planning Group Note  01/10/13 9:37 AM  Participation Quality:  Appropriate and Attentive  Affect:  Depressed and Flat  Cognitive:  Alert and Appropriate  Insight:  Developing/Improving  Engagement in Group:  Engaged  Modes of Intervention:  Discussion, Education, Problem-solving and Rapport Building  Summary of Progress/Problems:Pt attended dc planning group.  Pt shared that he is experiencing back pain which he rates at 6/7.  Pt rates depression at 3/4.  He denies SI and HI. Pt states that he is open to therapy for follow up to deal with grief and depression from his mothers death.    Retina Bernardy L 01/10/13, 9:37 AM

## 2012-12-29 MED ORDER — KETOROLAC TROMETHAMINE 30 MG/ML IJ SOLN
30.0000 mg | Freq: Once | INTRAMUSCULAR | Status: AC
  Administered 2012-12-29: 30 mg via INTRAMUSCULAR
  Filled 2012-12-29: qty 1

## 2012-12-29 MED ORDER — GABAPENTIN 600 MG PO TABS
600.0000 mg | ORAL_TABLET | Freq: Every day | ORAL | Status: DC
  Administered 2012-12-29 – 2012-12-30 (×2): 600 mg via ORAL
  Filled 2012-12-29 (×4): qty 1

## 2012-12-29 MED ORDER — GABAPENTIN 400 MG PO CAPS
400.0000 mg | ORAL_CAPSULE | Freq: Three times a day (TID) | ORAL | Status: DC
  Administered 2012-12-29 – 2012-12-31 (×6): 400 mg via ORAL
  Filled 2012-12-29 (×9): qty 1

## 2012-12-29 NOTE — Progress Notes (Signed)
Adult Psychoeducational Group Note  Date:  12/29/2012 Time:  7:08 PM  Group Topic/Focus:  Overcoming Stress:   The focus of this group is to define stress and help patients assess their triggers.  Participation Level:  Active  Participation Quality:  Appropriate, Attentive and Sharing  Affect:  Appropriate  Cognitive:  Alert and Appropriate  Insight: Appropriate and Good  Engagement in Group:  Engaged and Supportive  Modes of Intervention:  Discussion, Education and Support  Additional Comments:  Pt actively participated in group discussion on stress, including triggers for stress, physiological signs that one is stressed, and positive coping skills for dealing with stress. Pt shared numerous coping skills he uses in order to cope with feelings of stress. Pt was removed from group for several minutes for consultation with staff, but returned post-consultation.   Reinaldo Raddle K 12/29/2012, 7:08 PM

## 2012-12-29 NOTE — Progress Notes (Addendum)
D:  Patient's self inventory sheet, patient needs meds for pain/sleep.   Has good appetite, normal energy level, improving attention span.  Rated depression and hopelessness #2.  Denied withdrawals.  Denied SI.   Has experienced pain in past 24 hours, pain goal #2, worst pain #6.  After discharge, plans to stay clean.  Does have discharge plans.  No problems taking meds after discharge. A:  Medications administered per MD order.  Support and encouragement given throughout day. R:  Following treatment plan.  Denied SI and HI.  Denied A/V hallucinations.  Contracts for safety.  Patient stated his roommate saw him asleep and his "legs were jumping", this is involuntary.  Will discuss this with MD.

## 2012-12-29 NOTE — Progress Notes (Signed)
[facility] LCSW Aftercare Discharge Planning Group Note  12/29/2012 11:49 AM  Participation Quality:  Appropriate and Attentive  Affect:  Depressed  Cognitive:  Alert and Appropriate  Insight:  Developing/Improving  Engagement in Group:  Engaged  Modes of Intervention:  Discussion, Education, Problem-solving and Rapport Building  Summary of Progress/Problems: Pt attended group.  His affect was depressed. Pt rates his back pain at 6/7.  Pt rates depression at two and all other symptoms at zero.  He disclosed that he plans to return to his fathers home at discharge.  Eduardo [name] L 12/29/2012, 11:49 AM

## 2012-12-29 NOTE — Progress Notes (Signed)
[facility] MD Progress Note  12/29/2012 4:24 PM MURLIN SCHRIEBER  MRN:  [id number] Subjective:  States that he had more pain last night. He did not sleep well due to the pain. He admits to feeling more depressed today. He heard from his father that he is probably not going to be able to stay at the step mother's house. 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 He states that he is afraid he might need to go back on opioids to manage his pain.  Diagnosis:  Opioid Dependence, Benzodiazepine Dependence, MDD, GAD  ADL's:  Intact  Sleep: Poor  Appetite:  Fair  Suicidal Ideation:  Plan:  denies Intent:  denies Means:  denies Homicidal Ideation:  Plan:  denies Intent:  denies Means:  denies AEB (as evidenced by):  Psychiatric Specialty Exam: Review of Systems  Constitutional: Negative.   HENT: Negative.   Eyes: Negative.   Respiratory: Negative.   Cardiovascular: Negative.   Gastrointestinal: Negative.   Genitourinary: Negative.   Musculoskeletal: Positive for myalgias and back pain.  Skin: Negative.   Neurological: Negative.   Endo/Heme/Allergies: Negative.   Psychiatric/Behavioral: Positive for depression and substance abuse. The patient is nervous/anxious and has insomnia.     Blood pressure 133/82, pulse 68, temperature 98.3 F (36.8 C), temperature source Oral, resp. rate 16, height 6\' 4"  (1.93 m), weight 123.378 kg (272 lb).Body mass index is 33.12 kg/(m^2).  General Appearance: Fairly Groomed  Patent attorney::  Fair  Speech:  Clear and Coherent and Slow  Volume:  Decreased  Mood:  Depressed and in pain  Affect:  Restricted  Thought Process:  Coherent and Goal Directed  Orientation:  Full (Time, Place, and Person)  Thought Content:  WDL and soamtically focused  Suicidal Thoughts:  No  Homicidal Thoughts:  No  Memory:  Immediate;   Fair Recent;   Fair Remote;   Fair  Judgement:  Fair  Insight:  Present  Psychomotor Activity:   Decreased  Concentration:  Fair  Recall:  Fair  Akathisia:  No  Handed:  Right  AIMS (if indicated):     Assets:  Desire for Improvement  Sleep:  Number of Hours: 4   Current Medications: Current Facility-Administered Medications  Medication Dose Route Frequency Provider Last Rate Last Dose  . acetaminophen (TYLENOL) tablet 650 mg  650 mg Oral Q6H PRN Shuvon Rankin, NP   650 mg at 12/26/12 1201  . alum & mag hydroxide-simeth (MAALOX/MYLANTA) 200-200-20 MG/5ML suspension 30 mL  30 mL Oral Q4H PRN Shuvon Rankin, NP      . atenolol (TENORMIN) tablet 25 mg  25 mg Oral Daily Shuvon Rankin, NP   25 mg at 12/29/12 0810  . DULoxetine (CYMBALTA) DR capsule 60 mg  60 mg Oral Daily Rachael Fee, MD   60 mg at 12/29/12 0810  . gabapentin (NEURONTIN) capsule 400 mg  400 mg Oral TID Rachael Fee, MD   400 mg at 12/29/12 1139  . gabapentin (NEURONTIN) tablet 600 mg  600 mg Oral QHS Rachael Fee, MD      . lidocaine (LIDODERM) 5 % 1 patch  1 patch Transdermal Q24H Sanjuana Kava, NP   1 patch at 12/29/12 1138  . magnesium hydroxide (MILK OF MAGNESIA) suspension 30 mL  30 mL Oral Daily PRN Shuvon Rankin, NP      . methocarbamol (ROBAXIN) tablet 500 mg  500 mg Oral Q8H PRN Kerry Hough, PA-C  500 mg at 12/28/12 2358  . mirtazapine (REMERON) tablet 15 mg  15 mg Oral QHS Shuvon Rankin, NP   15 mg at 12/28/12 2151  . nicotine (NICODERM CQ - dosed in mg/24 hr) patch 7 mg  7 mg Transdermal Q0600 Rachael Fee, MD   7 mg at 12/29/12 1610  . thiamine (B-1) injection 100 mg  100 mg Intramuscular Once Shuvon Rankin, NP      . thiamine (VITAMIN B-1) tablet 100 mg  100 mg Oral Daily Shuvon Rankin, NP   100 mg at 12/29/12 0811  . traZODone (DESYREL) tablet 50 mg  50 mg Oral QHS PRN Wonda Cerise, MD        Lab Results: No results found for this or any previous visit (from the past 48 hour(s)).  Physical Findings: AIMS: Facial and Oral Movements Muscles of Facial Expression: None, normal Lips and Perioral Area:  None, normal Jaw: None, normal Tongue: None, normal,Extremity Movements Upper (arms, wrists, hands, fingers): None, normal Lower (legs, knees, ankles, toes): None, normal, Trunk Movements Neck, shoulders, hips: None, normal, Overall Severity Severity of abnormal movements (highest score from questions above): None, normal Incapacitation due to abnormal movements: None, normal Patient's awareness of abnormal movements (rate only patient's report): No Awareness, Dental Status Current problems with teeth and/or dentures?: No Does patient usually wear dentures?: No  CIWA:  CIWA-Ar Total: 1 COWS:  COWS Total Score: 1  Treatment Plan Summary: Daily contact with patient to assess and evaluate symptoms and progress in treatment Medication management  Plan: Supportive approach/coping skills/relapse prevention           Increase the Neurontin to 400 mg TID, 600 mg HS           Toradol 30 mg IM trial basis  Medical Decision Making Problem Points:  Review of psycho-social stressors (1) Data Points:  Review of new medications or change in dosage (2)  I certify that inpatient services furnished can reasonably be expected to improve the patient's condition.   Jayven Naill A 12/29/2012, 4:24 PM

## 2012-12-29 NOTE — Progress Notes (Signed)
Patient came to the window with complaint of back pain of 8 on a scale of 1-10 with 10 the worst at the beginning of this shift. His mood and affect blunted and sad. He reported having difficulty falling asleep as a result of the pain. Writer notified Karleen Hampshire the physician assistant and he ordered Robaxin 500 mg . Patient received the medication without difficulty. Returned to his room and Q 15 minute check continues as ordered to maintain safety.

## 2012-12-29 NOTE — Progress Notes (Signed)
[facility] LCSW Group Therapy  12/29/2012 2:45 PM  Type of Therapy:  Group Therapy  Participation Level:  Active  Participation Quality:  Appropriate and Attentive  Affect:  Depressed  Cognitive:  Alert and Appropriate  Insight:  Developing/Improving  Engagement in Therapy:  Engaged  Modes of Intervention: Discussion, Education, Problem-solving, Rapport Building and Support   Summary of Progress/Problems: Pt attended psychoeducation group. Today's theme was "Living a Balanced Life." Pts processed what balance means to them, what ways help up find balance, and what things make [place] lose balance. Pts also took turns discussing things that they would like to let go of and things they would like to hold on to in order to maintain balance in their lives.  Pt shared that he would like to hold on to his family.  He disclosed that he has not spent much time with his daughters and would like to take a vacation with them but does not have the money.  SW processed with patient free ways to spend time with his family like going to the park or walking down town.  Pt processed letting go of feelings of hopelessness.  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  Raychel Dowler L 12/29/2012, 2:45 PM

## 2012-12-29 NOTE — Progress Notes (Addendum)
Recreation Therapy Notes  Date: 03.13.2014  Time: 3:00pm  Location: [address] Day Room   Group Topic/Focus: Leisure Education   Participation Level:  Active   Participation Quality:  Appropriate   Affect:  Euthymic  Cognitive:  Appropriate   Additional Comments: Patient viewed informational video on QiGong. Patient chose to participate in instructional portion of video shown. Patient participated from a seated position. Patient participated in discussion about using recreation and leisure as a coping mechanism. Patient stated a leisure and recreation activity he can use as a coping mechanism: "target shooting." Patient offered suggestion to peer regarding recreation and leisure choices. LRT encouraged patient to use target shooting as a coping mechanism post D/C.   Marykay Lex Traniyah Hallett, LRT/CTRS   Kailah Pennel L 12/29/2012 4:30 PM

## 2012-12-30 MED ORDER — LOPERAMIDE HCL 2 MG PO CAPS
4.0000 mg | ORAL_CAPSULE | Freq: Once | ORAL | Status: AC
  Administered 2012-12-30: 4 mg via ORAL
  Filled 2012-12-30: qty 2

## 2012-12-30 MED ORDER — KETOROLAC TROMETHAMINE 30 MG/ML IJ SOLN
30.0000 mg | Freq: Once | INTRAMUSCULAR | Status: AC
  Administered 2012-12-30: 30 mg via INTRAVENOUS
  Filled 2012-12-30: qty 1

## 2012-12-30 MED ORDER — LOPERAMIDE HCL 2 MG PO CAPS: 2.0000 mg | ORAL_CAPSULE | Freq: Three times a day (TID) | ORAL | Status: DC | PRN

## 2012-12-30 MED ORDER — PANTOPRAZOLE SODIUM 40 MG PO TBEC
40.0000 mg | DELAYED_RELEASE_TABLET | Freq: Two times a day (BID) | ORAL | Status: DC
  Administered 2012-12-30 – 2012-12-31 (×2): 40 mg via ORAL
  Filled 2012-12-30 (×6): qty 1

## 2012-12-30 MED ORDER — CELECOXIB 100 MG PO CAPS
100.0000 mg | ORAL_CAPSULE | Freq: Two times a day (BID) | ORAL | Status: DC
  Administered 2012-12-30 – 2012-12-31 (×2): 100 mg via ORAL
  Filled 2012-12-30 (×6): qty 1

## 2012-12-30 NOTE — Progress Notes (Signed)
[facility] MD Progress Note  12/30/2012 12:25 PM Eduardo [name]  MRN:  [id number] Subjective:  His pain is a major trigger for his relapse as well as his depressed mood. He had some relief with the Toradol IM. He had a GI bleed in the past coming from the amount of Ibuprofen that he used. In looking for better pain management that could decrease the chance for relapse, will use Toradol IM again and add Celebrex 100 mg BID. He is more depressed today stating that his family is not allowing him to use his step mother's vacant house. He will have to go back and stay at his father's were he has a view of his mother's grave. He understands he needs to let go, but admits that having been so depressed with so much pain, has exacerbated his grief. He is afraid of relapsing Diagnosis:  Opiate, Benzodiazepine Dependence, Marijuana Abuse, MDD, GAD  ADL's:  Intact  Sleep: better last night with the increase in Neurontin  Appetite:  Fair  Suicidal Ideation:  Plan:  denies Intent:  denies Means:  denies Homicidal Ideation:  Plan:  denies Intent:  denies Means:  denies AEB (as evidenced by):  Psychiatric Specialty Exam: Review of Systems  Constitutional: Negative.   HENT: Positive for neck pain.   Eyes: Negative.   Respiratory: Negative.   Cardiovascular: Negative.   Gastrointestinal: Negative.   Genitourinary: Negative.   Musculoskeletal: Positive for back pain.  Skin: Negative.   Neurological: Negative.   Endo/Heme/Allergies: Negative.   Psychiatric/Behavioral: Positive for depression and substance abuse. The patient is nervous/anxious and has insomnia.     Blood pressure 137/90, pulse 84, temperature 98.7 F (37.1 C), temperature source Oral, resp. rate 20, height 6\' 4"  (1.93 m), weight 123.378 kg (272 lb).Body mass index is 33.12 kg/(m^2).  General Appearance: Fairly Groomed  Patent attorney::  Fair  Speech:  Clear and Coherent and Slow  Volume:  Decreased  Mood:  Depressed and worried, in  pain  Affect:  Restricted  Thought Process:  Coherent and Goal Directed  Orientation:  Full (Time, Place, and Person)  Thought Content:  WDL and worries and concerns about getting out with the living arragements and his pain   Suicidal Thoughts:  No  Homicidal Thoughts:  No  Memory:  Immediate;   Fair Recent;   Fair Remote;   Fair  Judgement:  Fair  Insight:  Present  Psychomotor Activity:  Decreased  Concentration:  Fair  Recall:  Fair  Akathisia:  No  Handed:  Right  AIMS (if indicated):     Assets:  Desire for Improvement  Sleep:  Number of Hours: 4.75   Current Medications: Current Facility-Administered Medications  Medication Dose Route Frequency Provider Last Rate Last Dose  . acetaminophen (TYLENOL) tablet 650 mg  650 mg Oral Q6H PRN Shuvon Rankin, NP   650 mg at 12/26/12 1201  . alum & mag hydroxide-simeth (MAALOX/MYLANTA) 200-200-20 MG/5ML suspension 30 mL  30 mL Oral Q4H PRN Shuvon Rankin, NP   30 mL at 12/30/12 0516  . atenolol (TENORMIN) tablet 25 mg  25 mg Oral Daily Shuvon Rankin, NP   25 mg at 12/30/12 0759  . DULoxetine (CYMBALTA) DR capsule 60 mg  60 mg Oral Daily Rachael Fee, MD   60 mg at 12/30/12 0759  . gabapentin (NEURONTIN) capsule 400 mg  400 mg Oral TID Rachael Fee, MD   400 mg at 12/30/12 1158  . gabapentin (NEURONTIN) tablet 600 mg  600 mg Oral QHS Rachael Fee, MD   600 mg at 12/29/12 2149  . lidocaine (LIDODERM) 5 % 1 patch  1 patch Transdermal Q24H Sanjuana Kava, NP   1 patch at 12/29/12 1138  . loperamide (IMODIUM) capsule 2 mg  2 mg Oral TID PRN Sanjuana Kava, NP      . magnesium hydroxide (MILK OF MAGNESIA) suspension 30 mL  30 mL Oral Daily PRN Shuvon Rankin, NP      . methocarbamol (ROBAXIN) tablet 500 mg  500 mg Oral Q8H PRN Kerry Hough, PA-C   500 mg at 12/28/12 2358  . mirtazapine (REMERON) tablet 15 mg  15 mg Oral QHS Shuvon Rankin, NP   15 mg at 12/29/12 2149  . nicotine (NICODERM CQ - dosed in mg/24 hr) patch 7 mg  7 mg Transdermal  Q0600 Rachael Fee, MD   7 mg at 12/30/12 0516  . thiamine (B-1) injection 100 mg  100 mg Intramuscular Once Shuvon Rankin, NP      . thiamine (VITAMIN B-1) tablet 100 mg  100 mg Oral Daily Shuvon Rankin, NP   100 mg at 12/30/12 0759  . traZODone (DESYREL) tablet 50 mg  50 mg Oral QHS PRN Wonda Cerise, MD        Lab Results: No results found for this or any previous visit (from the past 48 hour(s)).  Physical Findings: AIMS: Facial and Oral Movements Muscles of Facial Expression: None, normal Lips and Perioral Area: None, normal Jaw: None, normal Tongue: None, normal,Extremity Movements Upper (arms, wrists, hands, fingers): None, normal Lower (legs, knees, ankles, toes): None, normal, Trunk Movements Neck, shoulders, hips: None, normal, Overall Severity Severity of abnormal movements (highest score from questions above): None, normal Incapacitation due to abnormal movements: None, normal Patient's awareness of abnormal movements (rate only patient's report): No Awareness, Dental Status Current problems with teeth and/or dentures?: No Does patient usually wear dentures?: No  CIWA:  CIWA-Ar Total: 0 COWS:  COWS Total Score: 3  Treatment Plan Summary: Daily contact with patient to assess and evaluate symptoms and progress in treatment Medication management  Plan: Supportive approach/coping skills/relapse prevention           Optimize pain management           Grief and loss           Toradol 30 mg IM, followed by Celebrex 100 mg BID            Increase the Neurontin   Medical Decision Making Problem Points:  Review of last therapy session (1) and Review of psycho-social stressors (1) Data Points:  Review of medication regiment & side effects (2) Review of new medications or change in dosage (2)  I certify that inpatient services furnished can reasonably be expected to improve the patient's condition.   Cherryl Babin A 12/30/2012, 12:25 PM

## 2012-12-30 NOTE — Progress Notes (Signed)
[facility] Group Notes:  (Nursing/MHT/Case Management/Adjunct)  Date: 12/29/2012   Time:  2100  Type of Therapy:  wrap up group  Participation Level:  Active  Participation Quality:  Appropriate and Attentive  Affect:  Flat  Cognitive:  Alert  Insight:  Lacking  Engagement in Group:  Engaged  Modes of Intervention:  Clarification, Education and Support  Summary of Progress/Problems: Pt reports positives of the day being lower pain level and being referred to [facility] for pain management. Pt mentioned that he found out today that he can not return to his father and stepmothers house nor can he live in the vacant house he had hoped to discharge to. Pt feeling anxious and overwhelmed as he has no income and now no place to live.  Pt then proceeded to speak about other obstacles, loss of representation by lawyer (brother in law), court case in [place] soon, child support court date unknown, no income, relatives that are denying him support but have their own problems.  Pt was redirected by this writer to concentrate first on his immediate needs. Pt told that getting his pain under control seems promising and next step is to find independent living that may best benefit his progress. Pt told to focus energy on his well being rather that other's shortcomings. Shelah Lewandowsky 12/30/2012, 12:07 AM

## 2012-12-30 NOTE — Progress Notes (Signed)
[facility] LCSW Group Therapy  12/30/2012 1:15 PM  Type of Therapy:  Group Therapy from 1:15 to 2:30 PM  Participation Level:  Active  Participation Quality:  Appropriate  Affect:  Appropriate  Cognitive:  Alert, Appropriate and Oriented  Insight:  Developing/Improving  Engagement in Therapy:  Engaged  Modes of Intervention:  Discussion, Education and Support  Summary of Progress/Problems: Group session included discussion on feelings about relapse and an educational portion on Post Acute Withdrawal Syndrome (PAWS).  Patients were able to process their feelings about early recovery and the cycle of addiction. Another patient and CSW did role playing to give patient a better idea how to deal with negativity from family members. Eduardo [name] was able to process his desire to avoid negativity and seek out supports.    [name]

## 2012-12-30 NOTE — Progress Notes (Signed)
Pt. Is laughing and interacting with his peers.  Pt. States his pain level is always at a 4 or a 5, but is the worst at night when he tries to sleep.  Pt. Admits the neurotin helps the pain but feels he could use a little higher dose at HS.  Pt. Will be living with his Father and Step Mother when he is discharged, and is concerned he will start smoking again because she smokes but she does go outside to smoke.  Pt. Then states that would not be the worst thing for him to do if he started smoking as long as he stays off the opiates.  Pt. States his family is supportive and will encourage him. Pt. Denies SI and HI.

## 2012-12-30 NOTE — Progress Notes (Signed)
D: Patient denies SI/HI and A/V hallucinations; patient reports sleep is fair; reports appetite is good; reports energy level isnormal ; reports ability to pay attention is improving; rates depression as 6/10; rates hopelessness 5/10; complaints of diarrhea  A: Monitored q 15 minutes; patient encouraged to attend groups; patient educated about medications; patient given medications per physician orders; patient encouraged to express feelings and/or concerns  R: Patient appears to be sad and depressed but when not looking he is animated with his peers; patient's interaction with staff and peers is appropriate and assertive ;taking medications as prescribed and tolerating medications; patient is attending all groups

## 2012-12-30 NOTE — Progress Notes (Signed)
[facility] Adult Case Management Discharge Plan :  Will you be returning to the same living situation after discharge: Yes,  father's home At discharge, do you have transportation home?:Yes,  family Do you have the ability to pay for your medications:Yes,  through [facility]  Release of information consent forms completed and in the chart;  Patient's signature needed at discharge.  Patient to Follow up at: Follow-up Information   Follow up with [facility]. (Please follow up at [facility] clinic at discharge. Pt can walk in Monday- Friday from 9am - 3 pm.)    Contact information:   [address], [place] [address]  [facility] [phone] Valinda Hoar [phone]       Patient denies SI/HI:   Yes,  denies both    Safety Planning and Suicide Prevention discussed:  Yes,  with patient and SPE provided to patient  [name] 12/30/2012, 2:33 PM

## 2012-12-30 NOTE — Progress Notes (Addendum)
[facility] LCSW Aftercare Discharge Planning Group Note  12/30/2012 8:14M  Participation Quality:  Appropriate  Affect: Appropriate  Cognitive:  Alert and Oriented  Insight:  Developing  Engagement in Group: Engaged  Modes of Intervention:  Clarification, Exploration, and Support  Summary of Progress/Problems:  Pt denies both suicidal and homicidal ideation.  On a scale of 1 to 10 with ten being the most ever experienced, the patient rates depression at a 7 and anxiety at a 6-7. [name] reports family is unwilling for him to move into step mother's vacant home and this has caused his anxiety and depression to increase.  Patient reminded that this was the case earlier in week, yet continued to discuss unfairness of families decision. Patient will follow up at [facility].   Eduardo [name]

## 2012-12-30 NOTE — Progress Notes (Signed)
D   Pt appears sad and depressed  He interacts with others appropriately but minimally   He attends and participates in groups  He denies withdrawal symptoms at present   He denies suicidal ideation at present  He does admit to some suicidal ideation at times and contracts for safety    A   Verbal support given  Medications administered and effectiveness monitored  Q 15 min checks R   Pt safe at present

## 2012-12-30 NOTE — Progress Notes (Signed)
[facility] Group Notes:  (Nursing/MHT/Case Management/Adjunct)  Date:  12/30/2012  Time:  11:29 AM  Type of Therapy:  Psychoeducational Skills  Participation Level:  Active  Participation Quality:  Appropriate, Attentive and Sharing  Affect:  Appropriate  Cognitive:  Alert and Appropriate  Insight:  Good  Engagement in Group:  Engaged  Modes of Intervention:  Activity and Socialization  Summary of Progress/Problems:Pt was engaged in Therapeutic Activity where pts participated in a game called "Would you rather". Pt appeared to understand the concept of the group, stating "it was meant to make [place] laugh and have fun". Pts were explained how it can help after they are discharged.   Dalia Heading 12/30/2012, 11:29 AM

## 2012-12-31 MED ORDER — GABAPENTIN 300 MG PO CAPS
600.0000 mg | ORAL_CAPSULE | Freq: Every day | ORAL | Status: DC
Start: 2012-12-31 — End: 2012-12-31
  Filled 2012-12-31: qty 28
  Filled 2012-12-31: qty 2

## 2012-12-31 MED ORDER — GABAPENTIN 100 MG PO CAPS
200.0000 mg | ORAL_CAPSULE | Freq: Three times a day (TID) | ORAL | Status: DC
  Filled 2012-12-31 (×3): qty 2

## 2012-12-31 MED ORDER — GABAPENTIN 400 MG PO CAPS
400.0000 mg | ORAL_CAPSULE | Freq: Three times a day (TID) | ORAL | Status: DC
Start: 2012-12-31 — End: 2012-12-31
  Administered 2012-12-31: 400 mg via ORAL
  Filled 2012-12-31: qty 42
  Filled 2012-12-31 (×3): qty 1
  Filled 2012-12-31 (×2): qty 42

## 2012-12-31 MED ORDER — DULOXETINE HCL 60 MG PO CPEP: 60.0000 mg | ORAL_CAPSULE | Freq: Every day | ORAL | Status: DC

## 2012-12-31 MED ORDER — MIRTAZAPINE 15 MG PO TABS: 15.0000 mg | ORAL_TABLET | Freq: Every day | ORAL | Status: DC

## 2012-12-31 MED ORDER — GABAPENTIN 100 MG PO CAPS: ORAL_CAPSULE | ORAL | Status: DC

## 2012-12-31 MED ORDER — GABAPENTIN 300 MG PO CAPS: ORAL_CAPSULE | ORAL | Status: DC

## 2012-12-31 MED ORDER — GABAPENTIN 400 MG PO CAPS: ORAL_CAPSULE | ORAL | Status: DC

## 2012-12-31 MED ORDER — ATENOLOL 25 MG PO TABS: 25.0000 mg | ORAL_TABLET | Freq: Every day | ORAL | Status: DC

## 2012-12-31 MED ORDER — GABAPENTIN 600 MG PO TABS: 600.0000 mg | ORAL_TABLET | Freq: Every day | ORAL | Status: DC

## 2012-12-31 MED ORDER — GABAPENTIN 400 MG PO CAPS: 400.0000 mg | ORAL_CAPSULE | Freq: Three times a day (TID) | ORAL | Status: DC

## 2012-12-31 NOTE — Discharge Summary (Addendum)
Physician Discharge Summary Note  Patient:  Eduardo [name] is an 42 y.o., male MRN:  [id number] DOB:  [birth year] Patient phone:  [phone] (home)  Patient address:   [address] [place] [address],   Date of Admission:  12/23/2012  Date of Discharge: 12/31/12  Reason for Admission:  Opiate/Benzodiazepine dependence  Discharge Diagnoses: Principal Problem:   Opiate dependence Active Problems:   Benzodiazepine dependence   Benign essential hypertension antepartum   Major depression   Generalized anxiety disorder  Review of Systems  Constitutional: Positive for weight loss.  HENT: Negative.   Eyes: Negative.   Respiratory: Negative.   Cardiovascular: Negative.   Gastrointestinal: Negative.   Genitourinary: Negative.   Musculoskeletal: Positive for myalgias, back pain and joint pain.  Skin: Negative.   Neurological: Negative.   Endo/Heme/Allergies: Negative.   Psychiatric/Behavioral: Positive for depression (Stabilized with medication prior to discharge) and substance abuse (Hx alcoholism, cannabis abuse). Negative for suicidal ideas, hallucinations and memory loss. The patient is nervous/anxious (Stabilized with medication prior to discharge) and has insomnia (Stabilized with medication prior to discharge).    Axis Diagnosis:   AXIS I:  Generalized Anxiety Disorder and Opiate dependence, Benzodiazepine dependence, Major depression AXIS II:  Deferred AXIS III:   Past Medical History  Diagnosis Date  . Chronic back pain   . Degenerative disk disease   . Hypertension   . Seizures   . Pilonidal cyst    AXIS IV:  economic problems, occupational problems, other psychosocial or environmental problems and Substance abuse and dependencey AXIS V:  64  Level of Care:  OP  Hospital Course:  The patient presented to the [facility] after his PCP sent him for detox. He failed a drug test. His PCP stopped all of his Benzodiazepines and percocet. He last used any medications  a week ago. He was telepsyched in the ED and was recommended for in patient treatment for depression. He reported suicidal ideation but had no plan, but states "he wanted help, either he was going to get help or fix it himself." He states he has significant pain, and multiple medical bills, he has lost his job, and his house. "I've lost everything."  After admission assessment and evaluation, it was determined that Eduardo [name] will need detoxification treatment to stabilize his system of drug intoxication and to combat the withdrawal symptoms of substances. And his discharge plans included a referral and an appointment to an outpatient clinic for continuation of psychiatric.substance abuse treatment. Eduardo [name] was then started on both Clonidine/Librium protocol for his benzodiazepine/opiate detoxification. He was also enrolled in group counseling sessions and activities to learn coping skills that should help him after discharge to cope better, manage his substance abuse problems to maintain a much longer sobriety. He also was enrolled and attended AA/NA meetings being offered and held on this unit. He has some previous and or identifiable medical conditions that required treatment and or monitoring. He received medication management for all those health issues as well. He was monitored closely for any potential problems that may arise as a result of and or during detoxification treatment. Patient tolerated his treatment regimen and detoxification treatment without any significant adverse effects and or reactions.  Patient attended treatment team meeting this am and met with the team. His symptoms, substance abuse issues, response to treatment and discharge plans discussed. Patient endorsed that he is doing well and stable for discharge to pursue the next phase of his substance abuse treatment.he will continue psychiatric treatment  on an outpatient basis at [facility] psychiatric clinic here in [place]. He  is instructed that this is a walk-in appointment between the hours of 09:00 am and 3:00 pm M-F. The address, date, time and contact information for this clinic provided for patient in writing.  He was also encouraged to join/attend AA/NA meetings being offered and held within his community. He is instructed and encouraged to get a trusted sponsor from the advise of others or from whomever within the AA meetings seems to make sense, and who has a proven track record, and will hold him responsible for his sobriety, and both expects and insists on his total  abstinence from subtances. He must focus the first of each month on the speaker meetings where he will specifically look at how his life has been wrecked by drugss and how his life has been similar to that of the speaker's life.    Upon discharge, patient adamantly denies suicidal, homicidal ideations, auditory, visual hallucinations, delusional thinking and or withdrawal symptoms. Patient left [facility] with all personal belongings in no apparent distress. He received 2 weeks worth samples of his discharge medications. Transportation per family   Consults:  None  Significant Diagnostic Studies:  labs: CBC with diff, CMP, UDS, Toxicology tests  Discharge Vitals:   Blood pressure 128/76, pulse 97, temperature 97.8 F (36.6 C), temperature source Oral, resp. rate 16, height 6\' 4"  (1.93 m), weight 123.378 kg (272 lb). Body mass index is 33.12 kg/(m^2). Lab Results:   No results found for this or any previous visit (from the past 72 hour(s)).  Physical Findings: AIMS: Facial and Oral Movements Muscles of Facial Expression: None, normal Lips and Perioral Area: None, normal Jaw: None, normal Tongue: None, normal,Extremity Movements Upper (arms, wrists, hands, fingers): None, normal Lower (legs, knees, ankles, toes): None, normal, Trunk Movements Neck, shoulders, hips: None, normal, Overall Severity Severity of abnormal movements (highest score from  questions above): None, normal Incapacitation due to abnormal movements: None, normal Patient's awareness of abnormal movements (rate only patient's report): No Awareness, Dental Status Current problems with teeth and/or dentures?: No Does patient usually wear dentures?: No  CIWA:  CIWA-Ar Total: 0 COWS:  COWS Total Score: 3  Psychiatric Specialty Exam: See Psychiatric Specialty Exam and Suicide Risk Assessment completed by Attending Physician prior to discharge.  Discharge destination:  Home  Is patient on multiple antipsychotic therapies at discharge:  No   Has Patient had three or more failed trials of antipsychotic monotherapy by history:  No  Recommended Plan for Multiple Antipsychotic Therapies: NA     Medication List    STOP taking these medications       clonazePAM 1 MG tablet  Commonly known as:  KLONOPIN     magnesium hydroxide 400 MG/5ML suspension  Commonly known as:  MILK OF MAGNESIA     oxyCODONE-acetaminophen 10-325 MG per tablet  Commonly known as:  PERCOCET      TAKE these medications     Indication   atenolol 25 MG tablet  Commonly known as:  TENORMIN  Take 1 tablet (25 mg total) by mouth daily. For hypertension   Indication:  High Blood Pressure     DULoxetine 60 MG capsule  Commonly known as:  CYMBALTA  Take 1 capsule (60 mg total) by mouth daily. For major depression   Indication:  Major Depressive Disorder, Musculoskeletal Pain, Neuropathic Pain     gabapentin 400 MG capsule  Commonly known as:  NEURONTIN  Take 1 capsule (400  mg total) by mouth 3 (three) times daily. For anxiety/pain control   Indication:  Pain, Anxiety symptoms     gabapentin 300 MG capsule  Commonly known as:  NEURONTIN  Take 600 mg at bedtime   Indication:  Pain, Anxiety symptoms     mirtazapine 15 MG tablet  Commonly known as:  REMERON  Take 1 tablet (15 mg total) by mouth at bedtime. For depression/sleep   Indication:  Trouble Sleeping, Major Depressive Disorder        Follow-up Information   Follow up with [facility]. (Please follow up at [facility] clinic at discharge. Pt can walk in Monday- Friday from 9am - 3 pm.)    Contact information:   [address], [place] [address]  [facility] [phone] FAX [phone]      Follow-up recommendations:  Activity:  Activity: As tolerated, as advised by your physician. Speak to PCP about visual problems given family history of cataracts and macular degeneration. Diet: As recommended by your primary care doctor. Keep all scheduled follow-up appointments as recommended.  Comments: Take all your medications as prescribed by your mental healthcare provider. Report any adverse effects and or reactions from your medicines to your outpatient provider promptly. Patient is instructed and cautioned to not engage in alcohol and or illegal drug use while on prescription medicines. In the event of worsening symptoms, patient is instructed to call the crisis hotline, 911 and or go to the nearest ED for appropriate evaluation and treatment of symptoms. Follow-up with your primary care provider for your other medical issues, concerns and or health care needs.Speak to PCP about visual problems given family history of cataracts and macular degeneration.   Total Discharge Time:  Greater than 30 minutes.  Signed: Jacqulyn Cane, M.D.  12/31/2012 10:28 PM

## 2012-12-31 NOTE — Progress Notes (Signed)
Adult Psychoeducational Group Note  Date:  12/31/2012 Time:  1315  Group Topic/Focus:  Healthy Coping Skills  Participation Level:  Did Not Attend  Pt refused to attend Sneha Willig Shari Prows 12/31/2012, 2:11 PM

## 2012-12-31 NOTE — Progress Notes (Signed)
D/C instructions/meds/follow-up appointments reviewed, pt verbalized understanding, pt's belongings returned to pt, samples given. 

## 2012-12-31 NOTE — Progress Notes (Signed)
Adult Psychoeducational Group Note  Date:  12/31/2012 Time:  9:30 AM  Group Topic/Focus:  Self-Inventory  Participation Level:  Active  Participation Quality:  Appropriate and Attentive  Affect:  Appropriate  Cognitive:  Alert and Appropriate  Insight: Good  Engagement in Group:  Engaged  Modes of Intervention:  Clarification, explanation, orientation, support, rapport building, exploration  Additional Comments:  The pt was attentive and appropriately participated in group, pt expressed concerns about medication that could be causing blurry vision, assured pt that RN would collaborate with pharmacist and MD on this issue, spoke with MD and pharmacist, MD to talk with pt about his medication side effect concerns.  Eduardo [name] 12/31/2012, 9:30 AM

## 2012-12-31 NOTE — Progress Notes (Signed)
The patient attended the A. A. Meeting this evening.  
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
## 2012-12-31 NOTE — Clinical Social Work Note (Signed)
[facility] Group Notes:  (Clinical Social Work)  12/31/2012     10-11AM  Summary of Progress/Problems:   The main focus of today's process group was for the patient to identify ways in which they have in the past sabotaged their own recovery. Motivational Interviewing was utilized to ask the group members what they get out of their substance use, and what they want to change.  The Stages of Change were explained, and members identified where they currently are with regard to stages of change.  The patient expressed that he has self-sabotaged by hanging around with the "same 'ol people".  He said that he is addicted to marijuana, which is particularly hard today because it is everywhere in the media and on the news.  He will be returning to live with his father, states he is in preparation stage.  He cannot go fully into action stage until discharged today and able to put things in place.  Type of Therapy:  Group Therapy - Process   Participation Level:  Active  Participation Quality:  Appropriate, Attentive, Sharing and Supportive  Affect:  Appropriate  Cognitive:  Alert, Appropriate and Oriented  Insight:  Engaged  Engagement in Therapy:  Engaged  Modes of Intervention:  Education, Teacher, English as a foreign language, Exploration, Discussion, Motivational Interviewing   Eduardo Mantle, LCSW 12/31/2012, 11:53 AM

## 2013-01-03 ENCOUNTER — Encounter (INDEPENDENT_AMBULATORY_CARE_PROVIDER_SITE_OTHER): Payer: Self-pay | Admitting: General Surgery

## 2013-01-04 NOTE — Progress Notes (Signed)
Patient Discharge Instructions:  After Visit Summary (AVS):   Faxed to:  01/04/13 Discharge Summary Note:   Faxed to:  01/04/13 Psychiatric Admission Assessment Note:   Faxed to:  01/04/13 Suicide Risk Assessment - Discharge Assessment:   Faxed to:  01/04/13 Faxed/Sent to the Next Level Care provider:  01/04/13 Faxed to [facility] @ [phone]  Jerelene Redden, 01/04/2013, 4:03 PM

## 2014-10-25 ENCOUNTER — Encounter: Payer: Self-pay | Admitting: Internal Medicine

## 2014-10-25 ENCOUNTER — Ambulatory Visit: Payer: Self-pay | Attending: Internal Medicine | Admitting: Internal Medicine

## 2014-10-25 VITALS — BP 135/87 | HR 82 | Temp 98.2°F | Resp 16 | Wt 312.6 lb

## 2014-10-25 DIAGNOSIS — F329 Major depressive disorder, single episode, unspecified: Secondary | ICD-10-CM | POA: Insufficient documentation

## 2014-10-25 DIAGNOSIS — M545 Low back pain, unspecified: Secondary | ICD-10-CM

## 2014-10-25 DIAGNOSIS — Z720 Tobacco use: Secondary | ICD-10-CM

## 2014-10-25 DIAGNOSIS — Z833 Family history of diabetes mellitus: Secondary | ICD-10-CM | POA: Insufficient documentation

## 2014-10-25 DIAGNOSIS — Z79899 Other long term (current) drug therapy: Secondary | ICD-10-CM | POA: Insufficient documentation

## 2014-10-25 DIAGNOSIS — R569 Unspecified convulsions: Secondary | ICD-10-CM | POA: Insufficient documentation

## 2014-10-25 DIAGNOSIS — F32A Depression, unspecified: Secondary | ICD-10-CM

## 2014-10-25 DIAGNOSIS — I10 Essential (primary) hypertension: Secondary | ICD-10-CM | POA: Insufficient documentation

## 2014-10-25 DIAGNOSIS — F17200 Nicotine dependence, unspecified, uncomplicated: Secondary | ICD-10-CM

## 2014-10-25 DIAGNOSIS — Z139 Encounter for screening, unspecified: Secondary | ICD-10-CM

## 2014-10-25 DIAGNOSIS — G8929 Other chronic pain: Secondary | ICD-10-CM | POA: Insufficient documentation

## 2014-10-25 DIAGNOSIS — F17210 Nicotine dependence, cigarettes, uncomplicated: Secondary | ICD-10-CM | POA: Insufficient documentation

## 2014-10-25 DIAGNOSIS — F419 Anxiety disorder, unspecified: Secondary | ICD-10-CM | POA: Insufficient documentation

## 2014-10-25 LAB — COMPLETE METABOLIC PANEL WITH GFR
ALT: 22 U/L (ref 0–53)
AST: 21 U/L (ref 0–37)
Albumin: 4.7 g/dL (ref 3.5–5.2)
Alkaline Phosphatase: 124 U/L — ABNORMAL HIGH (ref 39–117)
BUN: 15 mg/dL (ref 6–23)
CO2: 32 mEq/L (ref 19–32)
Calcium: 10 mg/dL (ref 8.4–10.5)
Chloride: 102 mEq/L (ref 96–112)
Creat: 1.12 mg/dL (ref 0.50–1.35)
GFR, Est African American: >89 mL/min
GFR, Est Non African American: 79 mL/min
Glucose, Bld: 64 mg/dL — ABNORMAL LOW (ref 70–99)
Potassium: 4.3 mEq/L (ref 3.5–5.3)
Sodium: 141 mEq/L (ref 135–145)
Total Bilirubin: 0.9 mg/dL (ref 0.2–1.2)
Total Protein: 7.9 g/dL (ref 6.0–8.3)

## 2014-10-25 LAB — CBC WITH DIFFERENTIAL/PLATELET
Basophils Absolute: 0.1 10*3/uL (ref 0.0–0.1)
Basophils Relative: 1 % (ref 0–1)
Eosinophils Absolute: 0.3 10*3/uL (ref 0.0–0.7)
Eosinophils Relative: 2 % (ref 0–5)
HCT: 51.6 % (ref 39.0–52.0)
Hemoglobin: 18.2 g/dL — ABNORMAL HIGH (ref 13.0–17.0)
Lymphocytes Relative: 27 % (ref 12–46)
Lymphs Abs: 3.4 10*3/uL (ref 0.7–4.0)
MCH: 34 pg (ref 26.0–34.0)
MCHC: 35.3 g/dL (ref 30.0–36.0)
MCV: 96.4 fL (ref 78.0–100.0)
MPV: 9.9 fL (ref 8.6–12.4)
Monocytes Absolute: 0.9 10*3/uL (ref 0.1–1.0)
Monocytes Relative: 7 % (ref 3–12)
Neutro Abs: 7.9 10*3/uL — ABNORMAL HIGH (ref 1.7–7.7)
Neutrophils Relative %: 63 % (ref 43–77)
Platelets: 246 10*3/uL (ref 150–400)
RBC: 5.35 MIL/uL (ref 4.22–5.81)
RDW: 14.8 % (ref 11.5–15.5)
WBC: 12.6 10*3/uL — ABNORMAL HIGH (ref 4.0–10.5)

## 2014-10-25 LAB — LIPID PANEL
Cholesterol: 100 mg/dL (ref 0–200)
HDL: 22 mg/dL — ABNORMAL LOW (ref >39)
LDL Cholesterol: 42 mg/dL (ref 0–99)
Total CHOL/HDL Ratio: 4.5 Ratio
Triglycerides: 180 mg/dL — ABNORMAL HIGH (ref <150)
VLDL: 36 mg/dL (ref 0–40)

## 2014-10-25 LAB — TSH: TSH: 0.379 u[IU]/mL (ref 0.350–4.500)

## 2014-10-25 MED ORDER — BUPROPION HCL ER (SR) 100 MG PO TB12
100.0000 mg | ORAL_TABLET | Freq: Two times a day (BID) | ORAL | Status: DC
Start: 2014-10-25 — End: 2015-05-09

## 2014-10-25 NOTE — Progress Notes (Signed)
Patient here to establish care Patient has a history of depression and anxiety Patient was seeing Dr Gildardo Cranker in [place] but needed To find a new primary doctor due to her maternity leave

## 2014-10-25 NOTE — Progress Notes (Signed)
Patient Demographics  Eduardo [name], is a 44 y.o. male  RUE:[id number]  JYN:[id number]  DOB - 02-25-1971  CC:  Chief Complaint  Patient presents with  . Establish Care       HPI: Eduardo [name] is a 44 y.o. male here today to establish medical care.Patient has history of hypertension, chronic low back pain, major depression anxiety, insomnia, history of benzodiazepine dependence. Patient takes Neurontin and narcotic medication for his back pain which he was prescribed from his previous doctor, for depression he is taking Cymbalta and still thinks his symptoms are not well controlled, patient does smoke cigarettes and I have advised patient to quit smoking, discussed about trying Wellbutrin which might help him with smoking cessation as well as depression, patient is willing to try the medication, denies any SI or HI. patient has family history of diabetes and thyroid problem. Patient has No headache, No chest pain, No abdominal pain - No Nausea, No new weakness tingling or numbness, No Cough - SOB.  No Known Allergies Past Medical History  Diagnosis Date  . Chronic back pain   . Degenerative disk disease   . Hypertension   . Seizures   . Pilonidal cyst   . Depression   . MRSA (methicillin resistant staph aureus) culture positive    Current Outpatient Prescriptions on File Prior to Visit  Medication Sig Dispense Refill  . atenolol (TENORMIN) 25 MG tablet Take 1 tablet (25 mg total) by mouth daily. For hypertension 30 tablet   . DULoxetine (CYMBALTA) 60 MG capsule Take 1 capsule (60 mg total) by mouth daily. For major depression 30 capsule 0  . gabapentin (NEURONTIN) 300 MG capsule Take 600 mg at bedtime 60 capsule 0  . gabapentin (NEURONTIN) 400 MG capsule Take 1 capsule (400 mg total) by mouth 3 (three) times daily. For anxiety/pain control 90 capsule 0  . mirtazapine (REMERON) 15 MG tablet Take 1 tablet (15 mg total) by mouth at bedtime. For depression/sleep 30 tablet  0   No current facility-administered medications on file prior to visit.   Family History  Problem Relation Age of Onset  . Cancer Maternal Grandmother     breast, ovarian & colon  . Hypertension Mother   . Diabetes Mother   . Hypertension Father   . Stroke Paternal Grandfather    History   Social History  . Marital Status: Divorced    Spouse Name: [name]    Number of Children: [name]  . Years of Education: [name]   Occupational History  . Not on file.   Social History Main Topics  . Smoking status: Current Every Day Smoker -- 1.00 packs/day for 15 years    Types: Cigarettes  . Smokeless tobacco: Never Used  . Alcohol Use: No  . Drug Use: Yes    Special: Marijuana     Comment: 2 weeks ago  . Sexual Activity: Not on file     Comment: last night 12/21/12   Other Topics Concern  . Not on file   Social History Narrative    Review of Systems: Constitutional: Negative for fever, chills, diaphoresis, activity change, appetite change and fatigue. HENT: Negative for ear pain, nosebleeds, congestion, facial swelling, rhinorrhea, neck pain, neck stiffness and ear discharge.  Eyes: Negative for pain, discharge, redness, itching and visual disturbance. Respiratory: Negative for cough, choking, chest tightness, shortness of breath, wheezing and stridor.  Cardiovascular: Negative for chest pain, palpitations and leg swelling. Gastrointestinal: Negative for abdominal distention. Genitourinary: Negative for  dysuria, urgency, frequency, hematuria, flank pain, decreased urine volume, difficulty urinating and dyspareunia.  Musculoskeletal: Negative for back pain, joint swelling, arthralgia and gait problem. Neurological: Negative for dizziness, tremors, seizures, syncope, facial asymmetry, speech difficulty, weakness, light-headedness, numbness and headaches.  Hematological: Negative for adenopathy. Does not bruise/bleed easily. Psychiatric/Behavioral: Negative for hallucinations, behavioral  problems, confusion, dysphoric mood, decreased concentration and agitation.    Objective:   Filed Vitals:   10/25/14 1010  BP: 135/87  Pulse: 82  Temp: 98.2 F (36.8 C)  Resp: 16    Physical Exam: Constitutional: Patient appears well-developed and well-nourished. No distress. HENT: Normocephalic, atraumatic, External right and left ear normal. Oropharynx is clear and moist.  Eyes: Conjunctivae and EOM are normal. PERRLA, no scleral icterus. Neck: Normal ROM. Neck supple. No JVD. No tracheal deviation. No thyromegaly. CVS: RRR, S1/S2 +, no murmurs, no gallops, no carotid bruit.  Pulmonary: Effort and breath sounds normal, no stridor, rhonchi, wheezes, rales.  Abdominal: Soft. BS +, no distension, tenderness, rebound or guarding.  Musculoskeletal: Normal range of motion. Lower lumbar paraspinal tenderness   Neuro: Alert. Normal reflexes, muscle tone coordination. No cranial nerve deficit. Skin: Skin is warm and dry. No rash noted. Not diaphoretic. No erythema. No pallor. Psychiatric: Normal mood and affect. Behavior, judgment, thought content normal.  Lab Results  Component Value Date   WBC 14.9* 12/22/2012   HGB 17.9* 12/22/2012   HCT 47.8 12/22/2012   MCV 86.8 12/22/2012   PLT 180 12/22/2012   Lab Results  Component Value Date   CREATININE 1.04 12/22/2012   BUN 21 12/22/2012   NA 139 12/22/2012   K 3.6 12/22/2012   CL 102 12/22/2012   CO2 24 12/22/2012    No results found for: HGBA1C Lipid Panel  No results found for: CHOL, TRIG, HDL, CHOLHDL, VLDL, LDLCALC     Assessment and plan:   1. Chronic low back pain Patient is currently taking Neurontin and was taking Percocet which was prescribed by his previous doctor.  2. Smoking Advised patient to quit smoking, prescribed Wellbutrin - buPROPion (WELLBUTRIN SR) 100 MG 12 hr tablet; Take 1 tablet (100 mg total) by mouth 2 (two) times daily.  Dispense: 60 tablet; Refill: 3  3. Depression Currently patient is on  Cymbalta he was also try Wellbutrin which will help with the smoking cessation as well. - buPROPion (WELLBUTRIN SR) 100 MG 12 hr tablet; Take 1 tablet (100 mg total) by mouth 2 (two) times daily.  Dispense: 60 tablet; Refill: 3  4. Family history of diabetes mellitus (DM)  - Hemoglobin A1c  5. Screening Ordered baseline blood work. - CBC with Differential - COMPLETE METABOLIC PANEL WITH GFR - TSH - Lipid panel - Vit D  25 hydroxy (rtn osteoporosis monitoring)  Health Maintenance  -Vaccinations:  Pt declines flu shot and pneumovax    Return in about 3 months (around 01/24/2015) for hypertension.    Lorayne Marek, MD

## 2014-10-25 NOTE — Patient Instructions (Signed)
Smoking Cessation Quitting smoking is important to your health and has many advantages. However, it is not always easy to quit since nicotine is a very addictive drug. Oftentimes, people try 3 times or more before being able to quit. This document explains the best ways for you to prepare to quit smoking. Quitting takes hard work and a lot of effort, but you can do it. ADVANTAGES OF QUITTING SMOKING  You will live longer, feel better, and live better.  Your body will feel the impact of quitting smoking almost immediately.  Within 20 minutes, blood pressure decreases. Your pulse returns to its normal level.  After 8 hours, carbon monoxide levels in the blood return to normal. Your oxygen level increases.  After 24 hours, the chance of having a heart attack starts to decrease. Your breath, hair, and body stop smelling like smoke.  After 48 hours, damaged nerve endings begin to recover. Your sense of taste and smell improve.  After 72 hours, the body is virtually free of nicotine. Your bronchial tubes relax and breathing becomes easier.  After 2 to 12 weeks, lungs can hold more air. Exercise becomes easier and circulation improves.  The risk of having a heart attack, stroke, cancer, or lung disease is greatly reduced.  After 1 year, the risk of coronary heart disease is cut in half.  After 5 years, the risk of stroke falls to the same as a nonsmoker.  After 10 years, the risk of lung cancer is cut in half and the risk of other cancers decreases significantly.  After 15 years, the risk of coronary heart disease drops, usually to the level of a nonsmoker.  If you are pregnant, quitting smoking will improve your chances of having a healthy baby.  The people you live with, especially any children, will be healthier.  You will have extra money to spend on things other than cigarettes. QUESTIONS TO THINK ABOUT BEFORE ATTEMPTING TO QUIT You may want to talk about your answers with your  health care provider.  Why do you want to quit?  If you tried to quit in the past, what helped and what did not?  What will be the most difficult situations for you after you quit? How will you plan to handle them?  Who can help you through the tough times? Your family? Friends? A health care provider?  What pleasures do you get from smoking? What ways can you still get pleasure if you quit? Here are some questions to ask your health care provider:  How can you help me to be successful at quitting?  What medicine do you think would be best for me and how should I take it?  What should I do if I need more help?  What is smoking withdrawal like? How can I get information on withdrawal? GET READY  Set a quit date.  Change your environment by getting rid of all cigarettes, ashtrays, matches, and lighters in your home, car, or work. Do not let people smoke in your home.  Review your past attempts to quit. Think about what worked and what did not. GET SUPPORT AND ENCOURAGEMENT You have a better chance of being successful if you have help. You can get support in many ways.  Tell your family, friends, and coworkers that you are going to quit and need their support. Ask them not to smoke around you.  Get individual, group, or telephone counseling and support. Programs are available at local hospitals and health centers. Call   your local health department for information about programs in your area.  Spiritual beliefs and practices may help some smokers quit.  Download a "quit meter" on your computer to keep track of quit statistics, such as how long you have gone without smoking, cigarettes not smoked, and money saved.  Get a self-help book about quitting smoking and staying off tobacco. LEARN NEW SKILLS AND BEHAVIORS  Distract yourself from urges to smoke. Talk to someone, go for a walk, or occupy your time with a task.  Change your normal routine. Take a different route to work.  Drink tea instead of coffee. Eat breakfast in a different place.  Reduce your stress. Take a hot bath, exercise, or read a book.  Plan something enjoyable to do every day. Reward yourself for not smoking.  Explore interactive web-based programs that specialize in helping you quit. GET MEDICINE AND USE IT CORRECTLY Medicines can help you stop smoking and decrease the urge to smoke. Combining medicine with the above behavioral methods and support can greatly increase your chances of successfully quitting smoking.  Nicotine replacement therapy helps deliver nicotine to your body without the negative effects and risks of smoking. Nicotine replacement therapy includes nicotine gum, lozenges, inhalers, nasal sprays, and skin patches. Some may be available over-the-counter and others require a prescription.  Antidepressant medicine helps people abstain from smoking, but how this works is unknown. This medicine is available by prescription.  Nicotinic receptor partial agonist medicine simulates the effect of nicotine in your brain. This medicine is available by prescription. Ask your health care provider for advice about which medicines to use and how to use them based on your health history. Your health care provider will tell you what side effects to look out for if you choose to be on a medicine or therapy. Carefully read the information on the package. Do not use any other product containing nicotine while using a nicotine replacement product.  RELAPSE OR DIFFICULT SITUATIONS Most relapses occur within the first 3 months after quitting. Do not be discouraged if you start smoking again. Remember, most people try several times before finally quitting. You may have symptoms of withdrawal because your body is used to nicotine. You may crave cigarettes, be irritable, feel very hungry, cough often, get headaches, or have difficulty concentrating. The withdrawal symptoms are only temporary. They are strongest  when you first quit, but they will go away within 10-14 days. To reduce the chances of relapse, try to:  Avoid drinking alcohol. Drinking lowers your chances of successfully quitting.  Reduce the amount of caffeine you consume. Once you quit smoking, the amount of caffeine in your body increases and can give you symptoms, such as a rapid heartbeat, sweating, and anxiety.  Avoid smokers because they can make you want to smoke.  Do not let weight gain distract you. Many smokers will gain weight when they quit, usually less than 10 pounds. Eat a healthy diet and stay active. You can always lose the weight gained after you quit.  Find ways to improve your mood other than smoking. FOR MORE INFORMATION  www.smokefree.gov  Document Released: 09/29/2001 Document Revised: 02/19/2014 Document Reviewed: 01/14/2012 ExitCare Patient Information 2015 [employer]. This information is not intended to replace advice given to you by your health care provider. Make sure you discuss any questions you have with your health care provider. DASH Eating Plan DASH stands for "Dietary Approaches to Stop Hypertension." The DASH eating plan is a healthy eating plan that has   been shown to reduce high blood pressure (hypertension). Additional health benefits may include reducing the risk of type 2 diabetes mellitus, heart disease, and stroke. The DASH eating plan may also help with weight loss. WHAT DO I NEED TO KNOW ABOUT THE DASH EATING PLAN? For the DASH eating plan, you will follow these general guidelines:  Choose foods with a percent daily value for sodium of less than 5% (as listed on the food label).  Use salt-free seasonings or herbs instead of table salt or sea salt.  Check with your health care provider or pharmacist before using salt substitutes.  Eat lower-sodium products, often labeled as "lower sodium" or "no salt added."  Eat fresh foods.  Eat more vegetables, fruits, and low-fat dairy  products.  Choose whole grains. Look for the word "whole" as the first word in the ingredient list.  Choose fish and skinless chicken or turkey more often than red meat. Limit fish, poultry, and meat to 6 oz (170 g) each day.  Limit sweets, desserts, sugars, and sugary drinks.  Choose heart-healthy fats.  Limit cheese to 1 oz (28 g) per day.  Eat more home-cooked food and less restaurant, buffet, and fast food.  Limit fried foods.  Cook foods using methods other than frying.  Limit canned vegetables. If you do use them, rinse them well to decrease the sodium.  When eating at a restaurant, ask that your food be prepared with less salt, or no salt if possible. WHAT FOODS CAN I EAT? Seek help from a dietitian for individual calorie needs. Grains Whole grain or whole wheat bread. Brown rice. Whole grain or whole wheat pasta. Quinoa, bulgur, and whole grain cereals. Low-sodium cereals. Corn or whole wheat flour tortillas. Whole grain cornbread. Whole grain crackers. Low-sodium crackers. Vegetables Fresh or frozen vegetables (raw, steamed, roasted, or grilled). Low-sodium or reduced-sodium tomato and vegetable juices. Low-sodium or reduced-sodium tomato sauce and paste. Low-sodium or reduced-sodium canned vegetables.  Fruits All fresh, canned (in natural juice), or frozen fruits. Meat and Other Protein Products Ground beef (85% or leaner), grass-fed beef, or beef trimmed of fat. Skinless chicken or turkey. Ground chicken or turkey. Pork trimmed of fat. All fish and seafood. Eggs. Dried beans, peas, or lentils. Unsalted nuts and seeds. Unsalted canned beans. Dairy Low-fat dairy products, such as skim or 1% milk, 2% or reduced-fat cheeses, low-fat ricotta or cottage cheese, or plain low-fat yogurt. Low-sodium or reduced-sodium cheeses. Fats and Oils Tub margarines without trans fats. Light or reduced-fat mayonnaise and salad dressings (reduced sodium). Avocado. Safflower, olive, or canola  oils. Natural peanut or almond butter. Other Unsalted popcorn and pretzels. The items listed above may not be a complete list of recommended foods or beverages. Contact your dietitian for more options. WHAT FOODS ARE NOT RECOMMENDED? Grains White bread. White pasta. White rice. Refined cornbread. Bagels and croissants. Crackers that contain trans fat. Vegetables Creamed or fried vegetables. Vegetables in a cheese sauce. Regular canned vegetables. Regular canned tomato sauce and paste. Regular tomato and vegetable juices. Fruits Dried fruits. Canned fruit in light or heavy syrup. Fruit juice. Meat and Other Protein Products Fatty cuts of meat. Ribs, chicken wings, bacon, sausage, bologna, salami, chitterlings, fatback, hot dogs, bratwurst, and packaged luncheon meats. Salted nuts and seeds. Canned beans with salt. Dairy Whole or 2% milk, cream, half-and-half, and cream cheese. Whole-fat or sweetened yogurt. Full-fat cheeses or blue cheese. Nondairy creamers and whipped toppings. Processed cheese, cheese spreads, or cheese curds. Condiments Onion and garlic salt,   seasoned salt, table salt, and sea salt. Canned and packaged gravies. Worcestershire sauce. Tartar sauce. Barbecue sauce. Teriyaki sauce. Soy sauce, including reduced sodium. Steak sauce. Fish sauce. Oyster sauce. Cocktail sauce. Horseradish. Ketchup and mustard. Meat flavorings and tenderizers. Bouillon cubes. Hot sauce. Tabasco sauce. Marinades. Taco seasonings. Relishes. Fats and Oils Butter, stick margarine, lard, shortening, ghee, and bacon fat. Coconut, palm kernel, or palm oils. Regular salad dressings. Other Pickles and olives. Salted popcorn and pretzels. The items listed above may not be a complete list of foods and beverages to avoid. Contact your dietitian for more information. WHERE CAN I FIND MORE INFORMATION? National Heart, Lung, and Blood Institute: www.nhlbi.nih.gov/health/health-topics/topics/dash/ Document Released:  09/24/2011 Document Revised: 02/19/2014 Document Reviewed: 08/09/2013 ExitCare Patient Information 2015 [employer]. This information is not intended to replace advice given to you by your health care provider. Make sure you discuss any questions you have with your health care provider.  

## 2014-10-26 LAB — VITAMIN D 25 HYDROXY (VIT D DEFICIENCY, FRACTURES): Vit D, 25-Hydroxy: 20 ng/mL — ABNORMAL LOW (ref 30–100)

## 2014-10-26 LAB — HEMOGLOBIN A1C
Hgb A1c MFr Bld: 4.8 % (ref <5.7)
Mean Plasma Glucose: 91 mg/dL (ref <117)

## 2014-10-29 ENCOUNTER — Telehealth: Payer: Self-pay | Admitting: *Deleted

## 2014-10-29 MED ORDER — VITAMIN D (ERGOCALCIFEROL) 1.25 MG (50000 UNIT) PO CAPS: 50000.0000 [IU] | ORAL_CAPSULE | ORAL | Status: DC

## 2014-10-29 NOTE — Telephone Encounter (Signed)
Rx Ergocalciferol send to [facility] Left voice message to return call

## 2014-10-29 NOTE — Telephone Encounter (Signed)
-----   Message from Lorayne Marek, MD sent at 10/26/2014  9:22 AM EST ----- Blood work reviewed, noticed low vitamin D, call patient advise to start ergocalciferol 50,000 units once a week for the duration of  12 weeks.  noticed elevated triglycerides, advise patient for low fat diet.  Patient has chronically elevated  WBC count but improved compared to last year, will repeat CBC on the following visit.

## 2014-11-02 ENCOUNTER — Telehealth: Payer: Self-pay | Admitting: Emergency Medicine

## 2014-11-02 NOTE — Telephone Encounter (Signed)
-----   Message from Lorayne Marek, MD sent at 10/26/2014  9:22 AM EST ----- Blood work reviewed, noticed low vitamin D, call patient advise to start ergocalciferol 50,000 units once a week for the duration of  12 weeks.  noticed elevated triglycerides, advise patient for low fat diet.  Patient has chronically elevated  WBC count but improved compared to last year, will repeat CBC on the following visit.

## 2014-11-14 ENCOUNTER — Ambulatory Visit: Payer: Self-pay

## 2014-12-17 ENCOUNTER — Other Ambulatory Visit: Payer: Self-pay | Admitting: Orthopaedic Surgery

## 2014-12-17 DIAGNOSIS — M545 Low back pain: Secondary | ICD-10-CM

## 2015-01-05 ENCOUNTER — Ambulatory Visit
Admission: RE | Admit: 2015-01-05 | Discharge: 2015-01-05 | Disposition: A | Discharge status: Home / Self Care | Payer: Self-pay | Source: Ambulatory Visit | Attending: Orthopaedic Surgery | Admitting: Orthopaedic Surgery

## 2015-01-05 DIAGNOSIS — M545 Low back pain: Secondary | ICD-10-CM

## 2015-01-07 ENCOUNTER — Other Ambulatory Visit: Payer: Self-pay | Admitting: Orthopaedic Surgery

## 2015-01-07 DIAGNOSIS — M545 Low back pain: Secondary | ICD-10-CM

## 2015-01-28 ENCOUNTER — Ambulatory Visit
Admission: RE | Admit: 2015-01-28 | Discharge: 2015-01-28 | Disposition: A | Discharge status: Home / Self Care | Payer: Self-pay | Source: Ambulatory Visit | Attending: Orthopaedic Surgery | Admitting: Orthopaedic Surgery

## 2015-01-28 DIAGNOSIS — M545 Low back pain: Secondary | ICD-10-CM

## 2015-03-03 ENCOUNTER — Encounter (HOSPITAL_COMMUNITY): Payer: Self-pay | Admitting: *Deleted

## 2015-03-03 ENCOUNTER — Emergency Department (HOSPITAL_COMMUNITY): Payer: Self-pay

## 2015-03-03 ENCOUNTER — Emergency Department (HOSPITAL_COMMUNITY)
Admission: EM | Admit: 2015-03-03 | Discharge: 2015-03-03 | Disposition: A | Discharge status: Home / Self Care | Payer: Self-pay | Attending: Emergency Medicine | Admitting: Emergency Medicine

## 2015-03-03 DIAGNOSIS — K5900 Constipation, unspecified: Secondary | ICD-10-CM | POA: Insufficient documentation

## 2015-03-03 DIAGNOSIS — Z79899 Other long term (current) drug therapy: Secondary | ICD-10-CM | POA: Insufficient documentation

## 2015-03-03 DIAGNOSIS — R350 Frequency of micturition: Secondary | ICD-10-CM | POA: Insufficient documentation

## 2015-03-03 DIAGNOSIS — S32010A Wedge compression fracture of first lumbar vertebra, initial encounter for closed fracture: Secondary | ICD-10-CM | POA: Insufficient documentation

## 2015-03-03 DIAGNOSIS — Y998 Other external cause status: Secondary | ICD-10-CM | POA: Insufficient documentation

## 2015-03-03 DIAGNOSIS — Y9389 Activity, other specified: Secondary | ICD-10-CM | POA: Insufficient documentation

## 2015-03-03 DIAGNOSIS — G43909 Migraine, unspecified, not intractable, without status migrainosus: Secondary | ICD-10-CM | POA: Insufficient documentation

## 2015-03-03 DIAGNOSIS — Z720 Tobacco use: Secondary | ICD-10-CM | POA: Insufficient documentation

## 2015-03-03 DIAGNOSIS — G478 Other sleep disorders: Secondary | ICD-10-CM | POA: Insufficient documentation

## 2015-03-03 DIAGNOSIS — Z8614 Personal history of Methicillin resistant Staphylococcus aureus infection: Secondary | ICD-10-CM | POA: Insufficient documentation

## 2015-03-03 DIAGNOSIS — F329 Major depressive disorder, single episode, unspecified: Secondary | ICD-10-CM | POA: Insufficient documentation

## 2015-03-03 DIAGNOSIS — G8929 Other chronic pain: Secondary | ICD-10-CM | POA: Insufficient documentation

## 2015-03-03 DIAGNOSIS — R3915 Urgency of urination: Secondary | ICD-10-CM | POA: Insufficient documentation

## 2015-03-03 DIAGNOSIS — W010XXA Fall on same level from slipping, tripping and stumbling without subsequent striking against object, initial encounter: Secondary | ICD-10-CM | POA: Insufficient documentation

## 2015-03-03 DIAGNOSIS — Y92000 Kitchen of unspecified non-institutional (private) residence as  the place of occurrence of the external cause: Secondary | ICD-10-CM | POA: Insufficient documentation

## 2015-03-03 DIAGNOSIS — Z872 Personal history of diseases of the skin and subcutaneous tissue: Secondary | ICD-10-CM | POA: Insufficient documentation

## 2015-03-03 DIAGNOSIS — I10 Essential (primary) hypertension: Secondary | ICD-10-CM | POA: Insufficient documentation

## 2015-03-03 LAB — URINALYSIS, ROUTINE W REFLEX MICROSCOPIC
Bilirubin Urine: NEGATIVE
Glucose, UA: NEGATIVE mg/dL
Hgb urine dipstick: NEGATIVE
Ketones, ur: NEGATIVE mg/dL
Leukocytes, UA: NEGATIVE
Nitrite: NEGATIVE
Protein, ur: NEGATIVE mg/dL
Specific Gravity, Urine: 1.008 (ref 1.005–1.030)
Urobilinogen, UA: 1 mg/dL (ref 0.0–1.0)
pH: 6.5 (ref 5.0–8.0)

## 2015-03-03 MED ORDER — CYCLOBENZAPRINE HCL 10 MG PO TABS: 10.0000 mg | ORAL_TABLET | Freq: Two times a day (BID) | ORAL | Status: DC | PRN

## 2015-03-03 MED ORDER — ONDANSETRON 4 MG PO TBDP
4.0000 mg | ORAL_TABLET | Freq: Once | ORAL | Status: AC
  Administered 2015-03-03: 4 mg via ORAL
  Filled 2015-03-03: qty 1

## 2015-03-03 MED ORDER — OXYCODONE-ACETAMINOPHEN 10-325 MG PO TABS: 1.0000 | ORAL_TABLET | ORAL | Status: DC | PRN

## 2015-03-03 MED ORDER — HYDROMORPHONE HCL 1 MG/ML IJ SOLN: 1.0000 mg | Freq: Once | INTRAMUSCULAR | Status: DC

## 2015-03-03 MED ORDER — HYDROMORPHONE HCL 1 MG/ML IJ SOLN
1.0000 mg | Freq: Once | INTRAMUSCULAR | Status: AC
  Administered 2015-03-03: 1 mg via INTRAMUSCULAR
  Filled 2015-03-03: qty 1

## 2015-03-03 MED ORDER — OXYCODONE-ACETAMINOPHEN 5-325 MG PO TABS
2.0000 | ORAL_TABLET | Freq: Once | ORAL | Status: AC
  Administered 2015-03-03: 2 via ORAL
  Filled 2015-03-03: qty 2

## 2015-03-03 NOTE — ED Notes (Signed)
Greg from [employer] called, states he will be here with brace within 1hr and 51mins. Pt and family informed.

## 2015-03-03 NOTE — ED Notes (Signed)
Pt reports having two falls recently injuring his back. Pt states he slipped on wood and slipped in kitchen landing on dishwasher. Pt states he has L4 and L5 problems.

## 2015-03-03 NOTE — ED Notes (Signed)
[employer] representative at bedside, applying spinal brace.

## 2015-03-03 NOTE — Discharge Instructions (Signed)
Lumbar Fracture A fracture of a bone is the same as a break in the bone. A fracture in the lumbar area is a break that involves one of many parts that make up the 5 bones of the low back area. This is just above the pelvis.  CAUSES Most of these injuries occur as a result of an accident such as:  A fall.  A car accident.  Recreational activities.  A smaller number occur due to:  Industrial, farm, and aviation accidents.  Gunshot wounds and direct blows to the back.  Parachuting incidents. Most lumbar fractures affect the "building blocks" or the main portion of the spine known as the "vertebral bodies" (see the image on the right). A smaller number involve breaks to portions of bone that extend to the sides or backward behind the vertebral body. In the elderly, a sudden break can happen without an apparent cause. This is because the bones of the back have become extremely thin and fragile. This condition is known as osteoporosis. SYMPTOMS Patients with lumbar fractures have severe pain even if the actual break is small or limited, and there is no injury to nearby nerves. More severe or complex injuries involving other bones and/or organs may include:   Deformity of the back bones.  Swelling/bruising over the injured area.  Limited ability to move the affected area.  Partial or complete loss of function of the bladder and/or bowels. (This may be due to injury to nearby nerves).  More severe injuries can also cause:  Loss of sensation and/or strength in the legs, feet, and toes.  Paralysis. DIAGNOSIS In most cases, a lumbar fracture will be suspected by what happened just prior to the onset of back pain. X-rays and special imaging (CT scan and MRI imaging) are used to confirm the diagnosis as well as finding out the type and severity of the break or breaks. These tests guide treatment. But there are times when special imaging cannot be done. For example, MRI cannot be done if there  is an implanted metallic device (such as a pacemaker). In these cases, other tests and imaging are done. If there has been nerve damage, more tests can be done. These include:  Tests of nerve function through muscles (nerve conduction studies and electromyography).  Tests of bladder function (urodynamics).  Tests that focus on defining specific nerve problems before surgery and what improvement has come about after surgery (evoked potentials). TREATMENT Common injuries may involve a small break off of the main surface of the back bone. Or they may be in the form of a partial flattening or compression of the bone. Hospital care may not be needed for these. Medicine for pain control, special back bracing, and limitations in activity are done first. Physical therapy follows later. Complex breaks, multiple fractures of the spine, or unstable injuries can damage the spinal cord. They may require an operation to remove pressure from the nerves and/or spinal cord and to stabilize the broken pieces of bone. Each individual set of injuries is unique. The surgeon will take into consideration many things when planning the best surgical approach that will give the highest likelihood of a good outcome.  HOME CARE INSTRUCTIONS There is pain and stiffness in the back for weeks after a vertebral fracture. Bed rest, pain medicine, and a slow return to activity are generally recommended. Neck and back braces may be helpful in reducing pain and increasing mobility. When your pain allows, simple walking will help to begin the  process of returning to normal activities. Exercises to improve motion and to strengthen the back may also be useful after the initial pain goes away. This will be guided by your caregiver and the team (nurses, physical therapists, occupational therapists, etc.) involved with your ongoing care. For the elderly, treatment for osteoporosis may be needed to help reduce the risk of fractures in the  future. Arrange for follow-up care as recommended to assure proper long-term care and prevention of further spine injury. The failure to follow-up as recommended could result in permanent injury, disability, and a chronic painful condition. SEEK MEDICAL CARE IF:  Pain is not effectively controlled with medication.  You feel unable to decrease pain medication over time as planned.  Activity level is not improving as planned and/or expected. SEEK IMMEDIATE MEDICAL CARE IF:  You have increasing pain, vomiting, or are unable to move around at all.  You have numbness, tingling, weakness, or paralysis of any part of your body.  You have loss of normal bowel or bladder control.  You have difficulty breathing, cough, fever, chest or abdominal pain. Document Released: 01/20/2007 Document Revised: 12/28/2011 Document Reviewed: 09/20/2007 [facility] Patient Information 2015 [place], [place]. This information is not intended to replace advice given to you by your health care provider. Make sure you discuss any questions you have with your health care provider.  Back, Compression Fracture A compression fracture happens when a force is put upon the length of your spine. Slipping and falling on your bottom are examples of such a force. When this happens, sometimes the force is great enough to compress the building blocks (vertebral bodies) of your spine. Although this causes a lot of pain, this can usually be treated at home, unless your caregiver feels hospitalization is needed for pain control. Your backbone (spinal column) is made up of 24 main vertebral bodies in addition to the sacrum and coccyx (see illustration). These are held together by tough fibrous tissues (ligaments) and by support of your muscles. Nerve roots pass through the openings between the vertebrae. A sudden wrenching move, injury, or a fall may cause a compression fracture of one of the vertebral bodies. This may result in back pain or  spread of pain into the belly (abdomen), the buttocks, and down the leg into the foot. Pain may also be created by muscle spasm alone. Large studies have been undertaken to determine the best possible course of action to help your back following injury and also to prevent future problems. The recommendations are as follows. FOLLOWING A COMPRESSION FRACTURE: Do the following only if advised by your caregiver.   If a back brace has been suggested or provided, wear it as directed.  Do not stop wearing the back brace unless instructed by your caregiver.  When allowed to return to regular activities, avoid a sedentary lifestyle. Actively exercise. Sporadic weekend binges of tennis, racquetball, or waterskiing may actually aggravate or create problems, especially if you are not in condition for that activity.  Avoid sports requiring sudden body movements until you are in condition for them. Swimming and walking are safer activities.  Maintain good posture.  Avoid obesity.  If not already done, you should have a DEXA scan. Based on the results, be treated for osteoporosis. FOLLOWING ACUTE (SUDDEN) INJURY:  Only take over-the-counter or prescription medicines for pain, discomfort, or fever as directed by your caregiver.  Use bed rest for only the most extreme acute episode. Prolonged bed rest may aggravate your condition. Ice used for  acute conditions is effective. Use a large plastic bag filled with ice. Wrap it in a towel. This also provides excellent pain relief. This may be continuous. Or use it for 30 minutes every 2 hours during acute phase, then as needed. Heat for 30 minutes prior to activities is helpful.  As soon as the acute phase (the time when your back is too painful for you to do normal activities) is over, it is important to resume normal activities and work [employer] information [employer] manager. Back injuries can cause potentially marked changes in lifestyle. So it is important to attack these problems  aggressively.  See your caregiver for continued problems. He or she can help or refer you for appropriate exercises, physical therapy, and work hardening if needed.  If you are given narcotic medications for your condition, for the next 24 hours do not:  Drive.  Operate machinery or power tools.  Sign legal documents.  Do not drink alcohol, or take sleeping pills or other medications that may interfere with treatment. If your caregiver has given you a follow-up appointment, it is very important to keep that appointment. Not keeping the appointment could result in a chronic or permanent injury, pain, and disability. If there is any problem keeping the appointment, you must call back to this facility for assistance.  SEEK IMMEDIATE MEDICAL CARE IF:  You develop numbness, tingling, weakness, or problems with the use of your arms or legs.  You develop severe back pain not relieved with medications.  You have changes in bowel or bladder control.  You have increasing pain in any areas of the body. Document Released: 10/05/2005 Document Revised: 02/19/2014 Document Reviewed: 05/09/2008 [facility] Patient Information 2015 [place], [place]. This information is not intended to replace advice given to you by your health care provider. Make sure you discuss any questions you have with your health care provider.

## 2015-03-03 NOTE — ED Provider Notes (Signed)
CSN: [id number]     Arrival date & time 03/03/15  1756 History  This chart was scribed for non-physician provider Delos Haring, PA-C, working with Davonna Belling, MD by Irene Pap, ED Scribe. This patient was seen in room TR05C/TR05C and patient care was started at 8:07 PM.    Chief Complaint  Patient presents with  . Fall  . Back Pain   The history is provided by the patient. No language interpreter was used.  HPI Comments: Eduardo [name] is a 44 y.o. male with a history of chronic back pain who presents to the Emergency Department complaining of back pain onset one week ago. He states that he has had two falls recently; accidental falls from slipping on wet surfaces. He reports lack of sleep due to pain. He reports pain from the middle of his back to the lower back. He states that he tried to catch himself when he tripped over the dishwasher. He states that when he sits up he has a small amount of urinary incontinence but is otherwise able to control his bladder when he is not straining. He states that this is a new problem since the falls. He reports shooting pains down his legs and feet occasionally, but states that this is normal with his chronic back pain. Patient reports that he takes Cymbalta, meloxicam, and gabapentin but states that these do not relieve the pain. 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 He states that he has been unable to get the surgery done.   Past Medical History  Diagnosis Date  . Chronic back pain   . Degenerative disk disease   . Hypertension   . Seizures   . Pilonidal cyst   . Depression   . MRSA (methicillin resistant staph aureus) culture positive    Past Surgical History  Procedure Laterality Date  . Wrist fusion  2002  . Thoracic outlet surgery  2000   Family History  Problem Relation Age of Onset  . Cancer Maternal Grandmother     breast, ovarian  & colon  . Hypertension Mother   . Diabetes Mother   . Hypertension Father   . Stroke Paternal Grandfather    History  Substance Use Topics  . Smoking status: Current Every Day Smoker -- 1.00 packs/day for 15 years    Types: Cigarettes  . Smokeless tobacco: Never Used  . Alcohol Use: No    Review of Systems  Gastrointestinal: Positive for constipation. Negative for diarrhea.  Genitourinary: Positive for urgency and frequency.  Musculoskeletal: Positive for back pain.  Psychiatric/Behavioral: Positive for sleep disturbance.  All other systems reviewed and are negative.  Allergies  Review of patient's allergies indicates no known allergies.  Home Medications   Prior to Admission medications   Medication Sig Start Date End Date Taking? Authorizing Provider  ALPRAZolam (XANAX PO) Take by mouth.    Historical Provider, MD  atenolol (TENORMIN) 25 MG tablet Take 1 tablet (25 mg total) by mouth daily. For hypertension 12/31/12   Encarnacion Slates, NP  buPROPion [facility] SR) 100 MG 12 hr tablet Take 1 tablet (100 mg total) by mouth 2 (two) times daily. 10/25/14   Lorayne Marek, MD  cyclobenzaprine (FLEXERIL) 10 MG tablet Take 1 tablet (10 mg total) by mouth 2 (two) times daily as needed for muscle spasms. 03/03/15   Delos Haring, PA-C  DULoxetine (CYMBALTA) 60 MG capsule  Take 1 capsule (60 mg total) by mouth daily. For major depression 12/31/12   Encarnacion Slates, NP  gabapentin (NEURONTIN) 300 MG capsule Take 600 mg at bedtime 12/31/12   Encarnacion Slates, NP  gabapentin (NEURONTIN) 400 MG capsule Take 1 capsule (400 mg total) by mouth 3 (three) times daily. For anxiety/pain control 12/31/12   Encarnacion Slates, NP  Ibuprofen (ADVIL PO) Take by mouth.    Historical Provider, MD  mirtazapine (REMERON) 15 MG tablet Take 1 tablet (15 mg total) by mouth at bedtime. For depression/sleep 12/31/12   Encarnacion Slates, NP  oxyCODONE-acetaminophen (PERCOCET) 10-325 MG per tablet Take 1 tablet by mouth every 4 (four)  hours as needed for pain. 03/03/15   Delos Haring, PA-C  Vitamin D, Ergocalciferol, (DRISDOL) 50000 UNITS CAPS capsule Take 1 capsule (50,000 Units total) by mouth every 7 (seven) days. 10/29/14   Lorayne Marek, MD   BP 113/64 mmHg  Pulse 82  Temp(Src) 98.4 F (36.9 C) (Oral)  Resp 20  Ht 6\' 6"  (1.981 m)  Wt 301 lb 5 oz (136.674 kg)  BMI 34.83 kg/m2  SpO2 97%   Physical Exam  Constitutional: He is oriented to person, place, and time. He appears well-developed and well-nourished. No distress.  HENT:  Head: Normocephalic and atraumatic.  Eyes: EOM are normal.  Neck: Normal range of motion. Neck supple.  Cardiovascular: Normal rate.   Pulmonary/Chest: Effort normal.  Musculoskeletal: Normal range of motion.  Pt has equal strength to bilateral lower extremities.  Neurosensory function adequate to both legs Skin color is normal. Skin is warm and moist.  I see no step off deformity, no midline bony tenderness.  Pt is able to ambulate.  No crepitus, laceration, effusion, induration, lesions, swelling.   Pedal pulses are symmetrical and palpable bilaterally  Midline tenderness to L1- L3 tenderness to palpation of paraspinel muscles   Neurological: He is alert and oriented to person, place, and time.  Skin: Skin is warm and dry. He is not diaphoretic.  Psychiatric: He has a normal mood and affect. His behavior is normal.  Nursing note and vitals reviewed.   ED Course  Procedures (including critical care time) DIAGNOSTIC STUDIES: Oxygen Saturation is 97% on room air, normal by my interpretation.    COORDINATION OF CARE: 8:11 PM-Discussed treatment plan which includes x-ray and pain medication with pt at bedside and pt agreed to plan; patient states that he is not driving  5:78 PM- patient will be given Dilaudid, TSLO brace and follow up with orthopedics  Labs Review Labs Reviewed  URINALYSIS, ROUTINE W REFLEX MICROSCOPIC    Imaging Review Dg Lumbar Spine  Complete  03/03/2015   CLINICAL DATA:  Low back pain since a fall last week. The patient reports that he is developed incontinence since the fall.  EXAM: LUMBAR SPINE - COMPLETE 4+ VIEW  COMPARISON:  MRI dated 01/28/2015  FINDINGS: There is a new compression fracture of the anterior superior aspect of L1. There is no loss of posterior vertebral body height. Remainder of the lumbar spine is normal.  IMPRESSION: New benign appearing compression fracture of L1 since the prior MRI dated 01/28/2015.   Electronically Signed   By: Lorriane Shire M.D.   On: 03/03/2015 19:30     EKG Interpretation None      MDM   Final diagnoses:  Compression fracture of L1 lumbar vertebra, closed, initial encounter    Patient discussed with Dr. Alvino Chapel, he does not feel that MRI  is necessary at this time. The patient is ambulating. He does describe some exertion incontinence but otherwise has had normal urination. His urinalysis is negative. Pt to get a [employer] TLSO back brace. F/u with his orthopedic dr. He already has an appt scheduled for Mar 13, 2015.  44 y.o.Merrie Roof Krebbs's  with back pain. No neurological deficits and normal neuro exam. Patient can walk. No loss of bowel or bladder control. No concern for cauda equina at this time base on HPI and physical exam findings. No fever, night sweats, weight loss, h/o cancer, IVDU.   Patient Plan 1. Medications: NSAIDs and muscle relaxer. Cont usual home medications unless otherwise directed. 2. Treatment: Back brace and f/u with ortho. 3. Follow Up: Please followup with your primary doctor for discussion of your diagnoses and further evaluation after today's visit; if you do not have a primary care doctor use the resource guide provided to find one  Advised to follow-up with the orthopedist if symptoms do not start to resolve in the next 2-3 days. If develop loss of bowel or urinary control return to the ED as soon as possible for further evaluation. To take  the medications as prescribed as they can cause harm if not taken appropriately.   Vital signs are stable at discharge. Filed Vitals:   03/03/15 1812  BP: 113/64  Pulse: 82  Temp: 98.4 F (36.9 C)  Resp: 20    Patient/guardian has voiced understanding and agreed to follow-up with the PCP or specialist.   I personally performed the services described in this documentation, which was scribed in my presence. The recorded information has been reviewed and is accurate.   Delos Haring, PA-C 03/03/15 2157  Davonna Belling, MD 03/03/15 [phone]

## 2015-05-09 ENCOUNTER — Telehealth: Payer: Self-pay | Admitting: Neurology

## 2015-05-09 ENCOUNTER — Encounter: Payer: Self-pay | Admitting: Neurology

## 2015-05-09 ENCOUNTER — Ambulatory Visit (INDEPENDENT_AMBULATORY_CARE_PROVIDER_SITE_OTHER): Payer: Self-pay | Admitting: Neurology

## 2015-05-09 VITALS — BP 138/96 | HR 87 | Resp 18 | Ht 75.0 in | Wt 287.0 lb

## 2015-05-09 DIAGNOSIS — F39 Unspecified mood [affective] disorder: Secondary | ICD-10-CM

## 2015-05-09 DIAGNOSIS — M549 Dorsalgia, unspecified: Secondary | ICD-10-CM

## 2015-05-09 DIAGNOSIS — Z79899 Other long term (current) drug therapy: Secondary | ICD-10-CM

## 2015-05-09 DIAGNOSIS — G8929 Other chronic pain: Secondary | ICD-10-CM

## 2015-05-09 DIAGNOSIS — IMO0001 Reserved for inherently not codable concepts without codable children: Secondary | ICD-10-CM

## 2015-05-09 DIAGNOSIS — G609 Hereditary and idiopathic neuropathy, unspecified: Secondary | ICD-10-CM

## 2015-05-09 NOTE — Telephone Encounter (Signed)
I spoke to patient and gave him the advice that Dr. Rexene Alberts verbally gave me. She suggested that he restart his gabapentin and/or cymbalta. He reported that neither medication helped his leg pain and gabapentin caused ED. Eduardo [name] stated that he needed something for the pain. I advised him that Dr. Rexene Alberts does not prescribe narcotic pain medication. I suggested that he should contact the office that prescribes his oxycodone for a refill, contact PCP, or pursue treatment from a pain clinic as advised from Dr. Lorin Mercy' office. He voiced understanding.

## 2015-05-09 NOTE — Patient Instructions (Signed)
You may have a condition called peripheral neuropathy, i. e. nerve damage. There is no specific treatment for most neuropathies. The most common cause for neuropathy is diabetes in this country, in which case, tight glucose control is key. Other causes include thyroid disease, and some vitamin deficiencies. Certain medications such as chemotherapy agents and other chemicals or toxins including alcohol can cause neuropathy. There are some genetic conditions or hereditary neuropathies. Typically patients will report a family history of neuropathy in those conditions. There are cases associated with cancers and autoimmune conditions. Most neuropathies are progressive unless a root cause can be found and treated. For most neuropathies there is no actual cure or reversing of symptoms. Painful neuropathy can be difficult to treat symptomatically, but there are some medications available to ease the symptoms. Electrophysiologic testing with nerve conduction velocity studies and EMG (muscle testing) do not always pick up neuropathies that affect the smallest fibers. Other common tests include different type of blood work, and rarely, spinal fluid testing, and sometimes we resort to asking for a nerve and muscle biopsy.  I would like to investigate things further to look for evidence of neuropathy or nerve damage; therefore, I would like to do some blood work, and electrical testing of your muscles and nerves, which is known as EMG/NCV.

## 2015-05-09 NOTE — Progress Notes (Signed)
Subjective:    [name] is a 44 y.o. male.  HPI     Eduardo Age, MD, PhD Eduardo [name] [address], [address] [place], [place] [address]  Dear Dr. Lorin [name],   I saw your [name], Eduardo [name], upon your kind request in my neurologic clinic today for initial consultation of his neuropathy. Eduardo [name] is unaccompanied today. As you know, Eduardo [name] is a 44 year old right-handed gentleman with an underlying medical history of chronic low back pain, hypertension, depression, smoking, and obesity, who reports an approximately 2 year history of numbness, tingling and burning sensation in both feet. Eduardo [name] feels his symptoms have been progressive. His right foot is worse than left. Sometimes his feet feel very cold but when Eduardo [name] touches them they feel warm. Eduardo [name] has had skin lesions in his lower extremities but has not pursued any appointment with a dermatologist yet. Eduardo [name] denies any personal history of diabetes. Eduardo [name] denies any history of excessive alcohol use. In fact Eduardo [name] reports not drinking alcohol at all at this time. Eduardo [name] drinks caffeine in Eduardo [name] Eduardo [name] smokes one pack a day. Eduardo [name] lives alone. Eduardo [name] has 2 grown children. Eduardo [name] is currently not working. Eduardo [name] does [name] having fractured his right big toe in Eduardo past. Eduardo [name] was recently seen in Eduardo [name] on 03/03/2015 with a one-week history of worsening lower back pain and 2 falls reported, secondary to slipping on wet surfaces. I reviewed Eduardo [name] records. X-ray lumbar spine and 4 views from 03/03/2015 showed: New benign appearing compression fracture of L1 since Eduardo prior MRI dated 01/28/2015. Eduardo [name] had an MRI lumbar spine without contrast on 01/28/2015: Small right foraminal disc protrusion L3-4, not present in 2005. Mild spinal stenosis at L3-4. Small left lateral disc protrusion and osteophyte L4-5 similar to Eduardo prior study. Slight progression of spinal stenosis at this level.  Small central disc protrusion L5-S1 is unchanged. In addition, personally reviewed Eduardo images through Eduardo [name]  I reviewed your office notes from 04/24/2015, which you kindly included. Prior to that Eduardo [name] saw Dr. Louanne [name] Eduardo [name] had an EMG and nerve conduction study on 04/12/2015 which was interpreted by Dr. 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 There is also evidence of a severe right peroneal nerve neuropathy affecting sensory and motor components. This may be more of a deep peroneal neuropathy. I cannot completely rule out a severe L5 radiculopathy but this would not go along with his MRI findings.  For his depression and foot pain Eduardo [name] was on Cymbalta. Eduardo [name] was on gabapentin up to 2400 mg daily. Eduardo [name] has been on narcotic pain medication for his low back pain. About a month ago Eduardo [name] stopped all his medications except for his narcotic pain medication. Eduardo [name] was last seen by his primary care physician in January. Eduardo [name] had some blood work done in January which revealed low vitamin D and Eduardo [name] was given a prescription for vitamin D. Eduardo [name] reports no family history of neuropathy. His mother died with complications of her diabetes and some form of systemic infection. None of his siblings have any known neuromuscular disease. Eduardo [name] reports ongoing issues with low back pain, at times radiating to Eduardo right leg.  His Past Medical History Is Significant For: Past Medical History  Diagnosis Date  . Chronic back pain   .  Degenerative disk disease   . Hypertension   . Seizures   . Pilonidal cyst   . Depression   . MRSA (methicillin resistant staph aureus) culture positive   . Peripheral neuropathy   . Anxiety     His Past Surgical History Is Significant For: Past Surgical History  Procedure Laterality Date  . Wrist fusion  2002  .  Thoracic outlet surgery  2000    His Family History Is Significant For: Family History  Problem Relation [name] of Onset  . Cancer Maternal Grandmother     breast, ovarian & colon  . Hypertension Mother   . Diabetes Mother   . Hypertension Father   . Stroke Paternal Grandfather     His Social History Is Significant For: History   Social History  . Marital Status: Divorced    Spouse Name: [name]  . Number of Children: 2  . Years of Education: 12th    Occupational History  . [name]    Social History Main Topics  . Smoking status: Current Every Day Smoker -- 1.00 packs/day for 15 years    Types: Cigarettes  . Smokeless tobacco: Never Used  . Alcohol Use: No  . Drug Use: No     Comment: 2 weeks ago  . Sexual Activity: Not on file     Comment: last night 12/21/12   Other Topics Concern  . None   Social History Narrative   Drinks about 2-3 Mt Dews a day    His Allergies Are:  No Known Allergies:   His Current Medications Are:  Outpatient Encounter Prescriptions as of 05/09/2015  Medication Sig  . Oxycodone HCl 10 MG TABS TK 1 T PO  BID PRN P  . [DISCONTINUED] ALPRAZolam (XANAX PO) Take by mouth.  . [DISCONTINUED] atenolol (TENORMIN) 25 MG tablet Take 1 tablet (25 mg total) by mouth daily. For hypertension  . [DISCONTINUED] buPROPion (WELLBUTRIN SR) 100 MG 12 hr tablet Take 1 tablet (100 mg total) by mouth 2 (two) times daily.  . [DISCONTINUED] cyclobenzaprine (FLEXERIL) 10 MG tablet Take 1 tablet (10 mg total) by mouth 2 (two) times daily as needed for muscle spasms.  . [DISCONTINUED] DULoxetine (CYMBALTA) 60 MG capsule Take 1 capsule (60 mg total) by mouth daily. For major depression  . [DISCONTINUED] gabapentin (NEURONTIN) 300 MG capsule Take 600 mg at bedtime  . [DISCONTINUED] gabapentin (NEURONTIN) 400 MG capsule Take 1 capsule (400 mg total) by mouth 3 (three) times daily. For anxiety/pain control  . [DISCONTINUED] Ibuprofen (ADVIL PO) Take by mouth.  . [DISCONTINUED]  mirtazapine (REMERON) 15 MG tablet Take 1 tablet (15 mg total) by mouth at bedtime. For depression/sleep  . [DISCONTINUED] oxyCODONE-acetaminophen (PERCOCET) 10-325 MG per tablet Take 1 tablet by mouth every 4 (four) hours as needed for pain.  . [DISCONTINUED] Vitamin D, Ergocalciferol, (DRISDOL) 50000 UNITS CAPS capsule Take 1 capsule (50,000 Units total) by mouth every 7 (seven) days.   No facility-administered encounter medications on file as of 05/09/2015.  :   Review of Systems:  Out of a complete 14 point review of systems, all are reviewed and negative with Eduardo exception of these symptoms as listed below:  Review of Systems  HENT: Positive for tinnitus.   Gastrointestinal: Positive for blood in stool.  Endocrine:       Feeling hot, feeling cold   Musculoskeletal: Positive for joint swelling.       Joint pain   Neurological: Positive for numbness.  Snoring, restless legs, numbness and pain in both feet up to hips  Psychiatric/Behavioral:       Depression, not enough sleep, decreased energy, disinterest in activities, suicidal thoughts    Objective:  Neurologic Exam  Physical Exam Physical Examination:   Filed Vitals:   05/09/15 0954  BP: 138/96  Pulse: 87  Resp: 18    General Examination: Eduardo [name] is a very pleasant 44 y.o. male in no acute distress. Eduardo [name] appears well-developed and well-nourished and well groomed.   HEENT: Normocephalic, atraumatic, pupils are equal, round and reactive to light and accommodation. Funduscopic exam is normal with sharp disc margins noted. Extraocular tracking is good without limitation to gaze excursion or nystagmus noted. Normal smooth pursuit is noted. Hearing is grossly intact. Tympanic membranes are clear bilaterally. Face is symmetric with normal facial animation and normal facial sensation. Speech is clear with no dysarthria noted. There is no hypophonia. There is no lip, neck/head, jaw or voice tremor. Neck is supple with full  range of passive and active motion. There are no carotid bruits on auscultation. Oropharynx exam reveals: mild mouth dryness, adequate dental hygiene.   Chest: Clear to auscultation without wheezing, rhonchi or crackles noted.  Heart: S1+S2+0, regular and normal without murmurs, rubs or gallops noted.   Abdomen: Soft, non-tender and non-distended with normal bowel sounds appreciated on auscultation.  Extremities: There is no pitting edema in Eduardo distal lower extremities bilaterally. Pedal pulses are intact. Eduardo [name] has a slightly livid appearing discoloration of his toes.   Skin: Warm and dry without trophic changes noted. There are no varicose veins. Eduardo [name] has old appearing lesions throughout Eduardo LEs, with hyperpigmented scarring, circular, dime to quarter-sized.  Musculoskeletal: exam reveals no obvious joint deformities, tenderness or joint swelling or erythema. Eduardo [name] reports low back pain and reports a cramp in his right thigh during Eduardo exam.  Neurologically:  Mental status: Eduardo [name] is awake, alert and oriented in all 4 spheres. His immediate and remote memory, attention, language skills and fund of knowledge are appropriate. There is no evidence of aphasia, agnosia, apraxia or anomia. Speech is clear with normal prosody and enunciation. Thought process is linear. Mood is constricted and affect is blunted.  Cranial nerves II - XII are as described above under HEENT exam. In addition: shoulder shrug is normal with equal shoulder height noted. Motor exam: Normal bulk, strength and tone is noted. There is no drift, tremor or rebound. Romberg is negative. Reflexes are 2+ throughout. Babinski: Toes are flexor bilaterally. Fine motor skills and coordination: intact with normal finger taps, normal hand movements, normal rapid alternating patting, normal foot taps and normal foot agility.  Cerebellar testing: No dysmetria or intention tremor on finger to nose testing. Heel to shin is unremarkable  bilaterally. There is no truncal or gait ataxia.  Sensory exam: intact to light touch, pinprick, vibration, temperature sense in Eduardo upper extremities, with decreased sensation to all modalities noted in Eduardo distal lower extremities up to Eduardo ankle on Eduardo right side and up to Eduardo mid foot on Eduardo left side.  Gait, station and balance: Eduardo [name] stands with difficulty due to back pain reported. No veering to one side is noted. No leaning to one side is noted. Posture is [name]-appropriate and stance is narrow based. Gait shows normal stride length and normal pace. No problems turning are noted. Eduardo [name] turns en bloc.               Assessment and Plan:   In  summary, ASRIEL WESTRUP is a very pleasant 44 y.o.-year old male with an underlying medical history of chronic low back pain, hypertension, depression, smoking, and obesity, who reports an approximately 2 year history of numbness, tingling and burning sensation in both feet. His history and physical exam are concerning for peripheral neuropathy and further workup for treatable causes is indicated. For his skin lesions Eduardo [name] is advised to make an appointment with dermatology or ask for referral to dermatology by his primary care physician. Eduardo [name] had an EMG and nerve conduction test in Eduardo recent past which showed findings suggestive of axonal neuropathy. Eduardo [name] does not have any history of diabetes and reports no history of excessive alcohol use or alcohol abuse. I talked to Eduardo [name] at length about Eduardo diagnosis of peripheral neuropathy its prognosis and treatment options. Symptomatic treatment was tried before with Cymbalta and gabapentin. Eduardo [name] stopped both medications. Eduardo [name] is strongly advised never to abruptly stop any neuro or psychotropic medications as this can cause severe side effects or complications and flareup of mood disorder or pain and even more sinister complications. Eduardo [name] is advised strongly to follow-up with his primary care physician for his mood disorder  which is currently untreated. If Eduardo [name] has flareup of his low back pain or degenerative back disease, Eduardo [name] is advised to follow-up with your office.  At this juncture, I will add more blood work, and we will repeat EMG nerve conduction testing. We will call him with all his test results. I will see him back routinely for follow-up in a few months, sooner if needed.   Thank you very much for allowing me to participate in Eduardo care of this [name] If I can be of any further assistance to you please do not hesitate to call me at [phone].  Sincerely,   Eduardo Age, MD, PhD

## 2015-05-09 NOTE — Telephone Encounter (Signed)
Patient called and requested to get some medication for his leg pain. Please call and advise.

## 2015-05-10 NOTE — Progress Notes (Signed)
Quick Note:  Please call patient: labs so far okay, some things pending, likely back next week. The only finding that was out of range was a mildly elevated c-reactive protein, which is a nonspecific inflammatory marker. In isolation it does not suggest any specific abnormalities. It can be increased secondary to arthritis. Follow-up with primary care physician is recommended.  As far as pending test results we will keep them posted.  Star Age, MD, PhD [facility] ([facility]  ______

## 2015-05-13 LAB — HGB A1C W/O EAG: Hgb A1c MFr Bld: 4.8 % (ref 4.8–5.6)

## 2015-05-13 LAB — COMPREHENSIVE METABOLIC PANEL
ALT: 13 IU/L (ref 0–44)
AST: 15 IU/L (ref 0–40)
Albumin/Globulin Ratio: 1.6 (ref 1.1–2.5)
Albumin: 4.6 g/dL (ref 3.5–5.5)
Alkaline Phosphatase: 106 IU/L (ref 39–117)
BUN/Creatinine Ratio: 20 (ref 9–20)
BUN: 22 mg/dL (ref 6–24)
Bilirubin Total: 0.6 mg/dL (ref 0.0–1.2)
CO2: 22 mmol/L (ref 18–29)
Calcium: 9.8 mg/dL (ref 8.7–10.2)
Chloride: 99 mmol/L (ref 97–108)
Creatinine, Ser: 1.12 mg/dL (ref 0.76–1.27)
GFR calc Af Amer: 91 mL/min/{1.73_m2} (ref >59)
GFR calc non Af Amer: 78 mL/min/{1.73_m2} (ref >59)
Globulin, Total: 2.9 g/dL (ref 1.5–4.5)
Glucose: 93 mg/dL (ref 65–99)
Potassium: 4.8 mmol/L (ref 3.5–5.2)
Sodium: 138 mmol/L (ref 134–144)
Total Protein: 7.5 g/dL (ref 6.0–8.5)

## 2015-05-13 LAB — MULTIPLE MYELOMA PANEL, SERUM
Albumin SerPl Elph-Mcnc: 3.9 g/dL (ref 2.9–4.4)
Albumin/Glob SerPl: 1.1 (ref 0.7–1.7)
Alpha 1: 0.3 g/dL (ref 0.0–0.4)
Alpha2 Glob SerPl Elph-Mcnc: 0.7 g/dL (ref 0.4–1.0)
B-Globulin SerPl Elph-Mcnc: 1.4 g/dL — ABNORMAL HIGH (ref 0.7–1.3)
Gamma Glob SerPl Elph-Mcnc: 1.2 g/dL (ref 0.4–1.8)
Globulin, Total: 3.6 g/dL (ref 2.2–3.9)
IgA/Immunoglobulin A, Serum: 540 mg/dL — ABNORMAL HIGH (ref 90–386)
IgG (Immunoglobin G), Serum: 1248 mg/dL (ref 700–1600)
IgM (Immunoglobulin M), Srm: 59 mg/dL (ref 20–172)

## 2015-05-13 LAB — VITAMIN B6: Vitamin B6: 10.3 ug/L (ref 5.3–46.7)

## 2015-05-13 LAB — HOMOCYSTEINE: Homocysteine: 22.9 umol/L — ABNORMAL HIGH (ref 0.0–15.0)

## 2015-05-13 LAB — B12 AND FOLATE PANEL
Folate: 3.5 ng/mL (ref >3.0)
Vitamin B-12: 462 pg/mL (ref 211–946)

## 2015-05-13 LAB — HEAVY METALS PROFILE II, BLOOD
Arsenic: 3 ug/L (ref 2–23)
Cadmium: NOT DETECTED ug/L (ref 0.0–1.2)
Lead, Blood: NOT DETECTED ug/dL (ref 0–19)
Mercury: NOT DETECTED ug/L (ref 0.0–14.9)

## 2015-05-13 LAB — CK: Total CK: 115 U/L (ref 24–204)

## 2015-05-13 LAB — RHEUMATOID FACTOR: Rhuematoid fact SerPl-aCnc: 11.7 IU/mL (ref 0.0–13.9)

## 2015-05-13 LAB — VITAMIN D 25 HYDROXY (VIT D DEFICIENCY, FRACTURES): Vit D, 25-Hydroxy: 37.5 ng/mL (ref 30.0–100.0)

## 2015-05-13 LAB — TSH: TSH: 0.889 u[IU]/mL (ref 0.450–4.500)

## 2015-05-13 LAB — VITAMIN B1: Thiamine: 222.3 nmol/L — ABNORMAL HIGH (ref 66.5–200.0)

## 2015-05-13 LAB — ANA W/REFLEX: Anti Nuclear Antibody(ANA): NEGATIVE

## 2015-05-13 LAB — METHYLMALONIC ACID, SERUM: Methylmalonic Acid: 245 nmol/L (ref 0–378)

## 2015-05-13 LAB — C-REACTIVE PROTEIN: CRP: 5.7 mg/L — ABNORMAL HIGH (ref 0.0–4.9)

## 2015-05-13 LAB — RPR: RPR Ser Ql: NONREACTIVE

## 2015-05-17 ENCOUNTER — Telehealth: Payer: Self-pay

## 2015-05-17 NOTE — Telephone Encounter (Signed)
-----   Message from Star Age, MD sent at 05/17/2015  8:27 AM EDT ----- Further results on his blood work came back, pls call patient:  Mildly elevated c-reactive protein, which is a nonspecific inflammatory marker. In isolation it does not suggest any specific abnormalities. It can be increased secondary to arthritis. Thyroid, liver, kidney function fine, vit B12, B6, and D fine, diabetes marker and heavy metal level in blood normal, homocysteine level mildly up, likely due to smoking and smoking cessation is again recommended. Autoimmune and rheumatoid arthritis marker were normal. The only other mild abnormality was a mildly abnormal protein breakdown in his blood. This is again a nonspecific finding and not likely to cause him any symptoms. Nevertheless, we can seek consultation with a hematologist for complete reassurance. If he agrees I can make a referral to hematology for consultation. I will make a referral if he gives me to go ahead. Please let me know after talking to him.  Star Age, MD, PhD [facility])

## 2015-05-17 NOTE — Progress Notes (Signed)
Quick Note:  Further results on his blood work came back, pls call patient: Mildly elevated c-reactive protein, which is a nonspecific inflammatory marker. In isolation it does not suggest any specific abnormalities. It can be increased secondary to arthritis. Thyroid, liver, kidney function fine, vit B12, B6, and D fine, diabetes marker and heavy metal level in blood normal, homocysteine level mildly up, likely due to smoking and smoking cessation is again recommended. Autoimmune and rheumatoid arthritis marker were normal. The only other mild abnormality was a mildly abnormal protein breakdown in his blood. This is again a nonspecific finding and not likely to cause him any symptoms. Nevertheless, we can seek consultation with a hematologist for complete reassurance. If he agrees I can make a referral to hematology for consultation. I will make a referral if he gives me to go ahead. Please let me know after talking to him.  Star Age, MD, PhD [facility]  ______

## 2015-05-17 NOTE — Telephone Encounter (Signed)
I spoke to patient. He is aware of results and recommendation. He does not want a referral to Hematology right now. He states that he wants to know the cause of his lower back pain that radiates into his legs. What do you recommend?

## 2015-05-17 NOTE — Telephone Encounter (Signed)
Probably best to follow-up with his spine doctor for this. The hematology referral to a help shed some light on his bilateral burning foot pain. We will also await EMG and NCV test results.

## 2015-05-20 NOTE — Telephone Encounter (Signed)
I spoke to patient and helped educate him on what is going on. He has 2 conditions going on at the same time. DDD which can be responsible for his back/leg pain. I explained that there is no cure and his symptoms may be treated with ESIs, medication, or even surgery. This would best be done by his orthopedic doctor. The second condition he has is neuropathy. Patient understands that he is having further testing in the next couple of days that will give [place] a better understanding of what is going on. I advised him that I will call him back as soon as we get his results back. I again explained that there is no cure for this and his symptoms would have to be treated. He voices understanding. It also looks like he has a referral to pain management pending. Before hanging up I offered hematology referral, but he still wants to wait on that.

## 2015-05-23 ENCOUNTER — Encounter (INDEPENDENT_AMBULATORY_CARE_PROVIDER_SITE_OTHER): Payer: Self-pay | Admitting: Diagnostic Neuroimaging

## 2015-05-23 ENCOUNTER — Ambulatory Visit (INDEPENDENT_AMBULATORY_CARE_PROVIDER_SITE_OTHER): Payer: Self-pay | Admitting: Diagnostic Neuroimaging

## 2015-05-23 DIAGNOSIS — G8929 Other chronic pain: Secondary | ICD-10-CM

## 2015-05-23 DIAGNOSIS — Z0289 Encounter for other administrative examinations: Secondary | ICD-10-CM

## 2015-05-23 DIAGNOSIS — M549 Dorsalgia, unspecified: Secondary | ICD-10-CM

## 2015-05-23 DIAGNOSIS — G609 Hereditary and idiopathic neuropathy, unspecified: Secondary | ICD-10-CM

## 2015-05-23 NOTE — Procedures (Signed)
   [facility]  NCS (NERVE CONDUCTION STUDY) WITH EMG (ELECTROMYOGRAPHY) REPORT   STUDY DATE: 05/23/15 PATIENT NAME: Eduardo [name] DOB: 03-25-1971 MRN: [id number]  ORDERING CLINICIAN: Star Age, MD PhD   TECHNOLOGIST: Laretta Alstrom  ELECTROMYOGRAPHER: Earlean Polka. Stefanee Mckell, MD  CLINICAL INFORMATION: 44 year old male with back pain and burning sensation in legs. Evaluate for neuropathy.  FINDINGS: NERVE CONDUCTION STUDY: Right peroneal motor response is normal. Left peroneal motor response has decreased amplitude and normal conduction velocity. Right tibial motor response has decreased amplitude, normal conduction velocity and absent F wave latency. Left tibial motor response has prolonged distal latency, decreased amplitude and normal conduction velocity. Bilateral H reflex responses could not be obtained. Bilateral superficial peroneal sensory response could not be obtained.    NEEDLE ELECTROMYOGRAPHY: Needle examination of left lower extremity vastus medialis, tibialis anterior, gastrocnemius and left L4-5 paraspinal muscles are normal.   IMPRESSION:  Abnormal study demonstrating: 1. Severe axonal sensorimotor polyneuropathy. 2. Absent F wave latency and H reflex responses raise possibility of underlying lumbar polyradiculopathy. 3. No abnormal acute or chronic denervation changes on needle EMG study.     INTERPRETING PHYSICIAN:  Penni Bombard, MD Certified in Neurology, Neurophysiology and Neuroimaging  [facility] [address], [place], [place] [address] [phone]

## 2015-05-27 ENCOUNTER — Telehealth: Payer: Self-pay

## 2015-05-27 NOTE — Progress Notes (Signed)
Quick Note:  Please call patient to update him about the recent EMG and nerve conduction velocity test, which is the electrical nerve and muscle test we we performed. This was reported as abnormal, in keeping with nerve damage, called neuropathy. Please make FU appt so we can discuss further evaluation, referral to hematology, possible second opinion with one of our neuromuscular specialists, and treatment options.  Thanks,  Star Age, MD, PhD   ______

## 2015-05-27 NOTE — Telephone Encounter (Signed)
-----   Message from Star Age, MD sent at 05/27/2015 11:47 AM EDT ----- Please call patient to update him about the recent EMG and nerve conduction velocity test, which is the electrical nerve and muscle test we we performed. This was reported as abnormal, in keeping with nerve damage, called neuropathy. Please make FU appt so we can discuss further evaluation, referral to hematology, possible second opinion with one of our neuromuscular specialists, and treatment options.  Thanks,  Star Age, MD, PhD

## 2015-05-27 NOTE — Telephone Encounter (Signed)
I spoke to patient and he is aware of results and we were able to make an appt for Friday 26th. He asked that we mail the appt reminder to him, which I will do today.

## 2015-05-29 ENCOUNTER — Telehealth: Payer: Self-pay | Admitting: *Deleted

## 2015-05-29 NOTE — Telephone Encounter (Signed)
Patient requested a copy of records at front desk 05/29/15.

## 2015-06-07 ENCOUNTER — Encounter: Payer: Self-pay | Admitting: Neurology

## 2015-06-07 ENCOUNTER — Ambulatory Visit (INDEPENDENT_AMBULATORY_CARE_PROVIDER_SITE_OTHER): Payer: Self-pay | Admitting: Neurology

## 2015-06-07 VITALS — BP 147/99 | HR 68 | Resp 18 | Ht 75.0 in | Wt 290.0 lb

## 2015-06-07 DIAGNOSIS — M549 Dorsalgia, unspecified: Secondary | ICD-10-CM

## 2015-06-07 DIAGNOSIS — G8929 Other chronic pain: Secondary | ICD-10-CM

## 2015-06-07 DIAGNOSIS — G609 Hereditary and idiopathic neuropathy, unspecified: Secondary | ICD-10-CM

## 2015-06-07 MED ORDER — PREGABALIN 75 MG PO CAPS: ORAL_CAPSULE | ORAL | Status: DC

## 2015-06-07 NOTE — Progress Notes (Signed)
Subjective:    Patient ID: EDRIC FETTERMAN is a 44 y.o. male.  HPI     Interim history:   Mr. Villavicencio is a 44 year old right-handed gentleman with an underlying medical history of chronic low back pain, hypertension, depression, smoking, and obesity, who presents for follow-up consultation of his numbness, tingling and burning sensation in both feet of over 2 years duration. The patient is unaccompanied today. I first met him on 05/09/2015 at the request of his orthopedic specialist, at which time the patient reported an approximately 2 year history of numbness, tingling and burning sensation of both feet. He felt his symptoms were progressive and right more than left. He noticed abnormal sensations such as cold feeling in his feet when they actually were warm. On examination, he did have decreased sensation to all modalities in the right foot more than left foot. A prior EMG and nerve conduction test apparently had shown evidence of axonal neuropathy. I suggested further workup for neuropathy. We did some blood work including TSH, RPR, CMP, B12, folate, vitamin D, hemoglobin A1c, heavy metal profile, methylmalonic acid, homocysteine level, vitamin B6, ANA, CRP, rheumatoid factor, IFE and SPEP, CK level and vitamin B1. We called him with his test results. His homocystine level was elevated but this was in the context of smoking. His CRP was elevated mildly. He had mild abnormality on his IFE and SPEP showed a polyclonal gammopathy. I suggested a referral to hematology for consultation but he declined. He had an EMG and nerve conduction study in our office on 05/23/2015: Abnormal study demonstrating: 1. Severe axonal sensorimotor polyneuropathy. 2. Absent F wave latency and H reflex responses raise possibility of underlying lumbar polyradiculopathy. 3. No abnormal acute or chronic denervation changes on needle EMG study. We called him with his test results.  Today, 06/07/2015: He reports ongoing  issues with back pain. He has back pain from the mid to lower back on the right side. Sometimes he feels that his paraspinal muscles just want to twist. Sometimes when he lays down in bed he hears a pop and feels a pop in his lower back and his pain improves. His orthopedic doctor advised him to see a pain management doctor. However, he states that he cannot afford it. He has not seen his primary care physician either. He has not seen a dermatologist. His foot pain is about the same. He endorses burning sensation and tingling in his feet bilaterally, right more than left.  Previously:  He reports an approximately 2 year history of numbness, tingling and burning sensation in both feet. He feels his symptoms have been progressive. His right foot is worse than left. Sometimes his feet feel very cold but when he touches them they feel warm. He has had skin lesions in his lower extremities but has not pursued any appointment with a dermatologist yet. He denies any personal history of diabetes. He denies any history of excessive alcohol use. In fact he reports not drinking alcohol at all at this time. He drinks caffeine in the form of [facility]. He smokes one pack a day. He lives alone. He has 2 grown children. He is currently not working. He does report having fractured his right big toe in the past. He was recently seen in the emergency room on 03/03/2015 with a one-week history of worsening lower back pain and 2 falls reported, secondary to slipping on wet surfaces. I reviewed the emergency room records. X-ray lumbar spine and 4 views from 03/03/2015 showed:  New benign appearing compression fracture of L1 since the prior MRI dated 01/28/2015. He had an MRI lumbar spine without contrast on 01/28/2015: Small right foraminal disc protrusion L3-4, not present in 2005. Mild spinal stenosis at L3-4. Small left lateral disc protrusion and osteophyte L4-5 similar to the prior study. Slight progression of spinal  stenosis at this level. Small central disc protrusion L5-S1 is unchanged. In addition, personally reviewed the images through the PACS system.  I reviewed your office notes from 04/24/2015, which you kindly included. Prior to that he saw Dr. Louanne Skye. The patient had an EMG and nerve conduction study on 04/12/2015 which was interpreted by Dr. Louanne Skye, and I reviewed the report: The above electrodiagnostic study is abnormal and revealed evidence most consistent with significant axonal more than demyelinating peripheral neuropathy of the bilateral lower extremities. There is also evidence of a severe right peroneal nerve neuropathy affecting sensory and motor components. This may be more of a deep peroneal neuropathy. I cannot completely rule out a severe L5 radiculopathy but this would not go along with his MRI findings.  For his depression and foot pain he was on Cymbalta. He was on gabapentin up to 2400 mg daily. He has been on narcotic pain medication for his low back pain. About a month ago he stopped all his medications except for his narcotic pain medication. He was last seen by his primary care physician in January. He had some blood work done in January which revealed low vitamin D and he was given a prescription for vitamin D. He reports no family history of neuropathy. His mother died with complications of her diabetes and some form of systemic infection. None of his siblings have any known neuromuscular disease. He reports ongoing issues with low back pain, at times radiating to the right leg.  His Past Medical History Is Significant For: Past Medical History  Diagnosis Date  . Chronic back pain   . Degenerative disk disease   . Hypertension   . Seizures   . Pilonidal cyst   . Depression   . MRSA (methicillin resistant staph aureus) culture positive   . Peripheral neuropathy   . Anxiety     His Past Surgical History Is Significant For: Past Surgical History  Procedure Laterality Date   . Wrist fusion  2002  . Thoracic outlet surgery  2000    His Family History Is Significant For: Family History  Problem Relation Age of Onset  . Cancer Maternal Grandmother     breast, ovarian & colon  . Hypertension Mother   . Diabetes Mother   . Hypertension Father   . Stroke Paternal Grandfather     His Social History Is Significant For: Social History   Social History  . Marital Status: Divorced    Spouse Name: [name]  . Number of Children: 2  . Years of Education: 12th    Occupational History  . [name]    Social History Main Topics  . Smoking status: Current Every Day Smoker -- 1.00 packs/day for 15 years    Types: Cigarettes  . Smokeless tobacco: Never Used  . Alcohol Use: No  . Drug Use: No     Comment: 2 weeks ago  . Sexual Activity: Not Asked     Comment: last night 12/21/12   Other Topics Concern  . None   Social History Narrative   Drinks about 2-3 Mt Dews a day    His Allergies Are:  No Known Allergies:   His  Current Medications Are:  Outpatient Encounter Prescriptions as of 06/07/2015  Medication Sig  . Aspirin-Caffeine (BAYER BACK & BODY PO) Take by mouth.  . pregabalin (LYRICA) 75 MG capsule 1 pill each bedtime for one week, then one pill twice daily thereafter.  . [DISCONTINUED] Oxycodone HCl 10 MG TABS TK 1 T PO  BID PRN P   No facility-administered encounter medications on file as of 06/07/2015.  :  Review of Systems:  Out of a complete 14 point review of systems, all are reviewed and negative with the exception of these symptoms as listed below:   Review of Systems  Neurological:       Back pain. Patient states that he has back pain from mid-back/lower back that radiates into legs. Reports that it feels like his spine is constantly being twisted.     Objective:  Neurologic Exam  Physical Exam Physical Examination:   Filed Vitals:   06/07/15 1201  BP: 147/99  Pulse: 68  Resp: 18    General Examination: The patient is a very  pleasant 44 y.o. male in no acute distress. He appears well-developed and well-nourished and adequately groomed.   HEENT: Normocephalic, atraumatic, pupils are equal, round and reactive to light and accommodation. Funduscopic exam is normal with sharp disc margins noted. Extraocular tracking is good without limitation to gaze excursion or nystagmus noted. Normal smooth pursuit is noted. Hearing is grossly intact. Tympanic membranes are clear bilaterally. Face is symmetric with normal facial animation and normal facial sensation. Speech is clear with no dysarthria noted. There is no hypophonia. There is no lip, neck/head, jaw or voice tremor. Neck is supple with full range of passive and active motion. There are no carotid bruits on auscultation. Oropharynx exam reveals: mild mouth dryness, adequate dental hygiene.   Chest: Clear to auscultation without wheezing, rhonchi or crackles noted.  Heart: S1+S2+0, regular and normal without murmurs, rubs or gallops noted.   Abdomen: Soft, non-tender and non-distended with normal bowel sounds appreciated on auscultation.  Extremities: There is no pitting edema in the distal lower extremities bilaterally. Pedal pulses are intact. He has a slightly livid appearing discoloration of his toes.   Skin: Warm and dry without trophic changes noted. There are no varicose veins. He has old appearing lesions throughout the LEs, with hyperpigmented scarring, circular, dime to quarter-sized, unchanged, some signs of skin picking.  Musculoskeletal: exam reveals no obvious joint deformities, tenderness or joint swelling or erythema. He reports low back pain.  Neurologically:  Mental status: The patient is awake, alert and oriented in all 4 spheres. His immediate and remote memory, attention, language skills and fund of knowledge are appropriate. There is no evidence of aphasia, agnosia, apraxia or anomia. Speech is clear with normal prosody and enunciation. Thought process  is linear. Mood is constricted and affect is blunted.  Cranial nerves II - XII are as described above under HEENT exam. In addition: shoulder shrug is normal with equal shoulder height noted. Motor exam: Normal bulk, strength and tone is noted, he has decrease in hip flexor strength bilaterally, but this appears to be secondary to giveaway, possibly from pain as he grimaces throughout motor testing. There is no drift, tremor or rebound. Romberg is negative, except mils sway. Reflexes are 2+ throughout. Babinski: Toes are flexor bilaterally. Fine motor skills and coordination: intact with normal finger taps, normal hand movements, normal rapid alternating patting, normal foot taps and normal foot agility.  Cerebellar testing: No dysmetria or intention tremor on finger  to nose testing. Heel to shin is unremarkable bilaterally. There is no truncal or gait ataxia.  Sensory exam: intact to light touch, pinprick, vibration, temperature sense in the upper extremities, with decreased sensation to all modalities noted in the distal lower extremities up to the ankles, right a little worse than left side.  Gait, station and balance: He stands with difficulty due to back pain reported. No veering to one side is noted. No leaning to one side is noted. Posture is age-appropriate and stance is narrow based. Gait shows normal stride length and normal pace. No problems turning are noted. He turns en bloc. He does not feel comfortable doing tandem walk.              Assessment and Plan:   In summary, ERNAN RUNKLES is a very pleasant 44 year old male with an underlying medical history of chronic low back pain, hypertension, depression, smoking, and obesity, who reports an approximately 2 year history of numbness, tingling and burning sensation in both feet. His history and physical exam are concerning for peripheral neuropathy and further workup for treatable causes was initiated during his first visit in July. So far  we have not found a cause. He is advised, that unfortunately in a high percentage, maybe as high as 40-70% of cases when there is not a clear hereditary cause or diabetes related neuropathy, the cause remains unclear. He had a mild abnormality on his protein electrophoresis, EMG nerve conduction testing confirmed axonal neuropathy. He has ongoing issues with back pain with radiation to the right. He's encouraged to continue to pursue management and treatment for this with his spine doctor. He is to see another spine Dr. years ago and was told at one point that he needed surgery. He could not afford to see pain management. For symptomatic treatment of his painful neuropathy I suggested a trial of Lyrica starting with 75 mg once daily at night. I wrote down instructions for him. I also wrote down potential side effects for him. I provided him with a new prescription. After one week he can increase Lyrica to 75 mg twice daily. He may be a reasonable candidate for spinal cord stimulator. I did not suggest a referral quite yet. I did provide him with patient information material on this procedure. In addition, I discussed with him the possibility of seeing one of my colleagues, in particular, Dr. Jaynee Eagles, who is a specialist in neuromuscular diseases. She has kindly agreed to see the patient. He is agreeable as well. Dr. Cathren Laine nurse will be in touch to make a new patient appointment for him. She may do additional blood work and I will hold off on adding any more studies at this time. For his skin lesions which do not appear to be acute or new, he may still benefit from seeing a dermatologist. Further blood work may include HIV and hepatitis testing. Again, since Dr. Jaynee Eagles may add additional blood work at the time, I will forego blood work today.  At this time, he is off of Cymbalta and gabapentin. He stopped both medications on his own.  I spent 20 minutes in total face-to-face time with the patient, more than 50% of  which was spent in counseling and coordination of care, reviewing test results, reviewing medication and discussing or reviewing the diagnosis of PN, its prognosis and treatment options.

## 2015-06-07 NOTE — Patient Instructions (Addendum)
We can try a low dose Lyrica for your foot and leg pain: Lyrica (generic name: pregabalin) 50 mg strength: Take 1 pill each bedtime for one week, then one pill twice daily thereafter Common side effects are sleepiness, concentration problems, dizziness, swelling. Angioedema (e.g. swelling of the throat, head and neck) can occur, and may be associated with life-threatening respiratory compromise requiring emergency treatment. Discontinue LYRICA immediately in that case. Hypersensitivity reactions (e.g. hives, dyspnea, and wheezing) can occur. Discontinue LYRICA immediately in that case. Increased seizure frequency may occur in patients with seizure disorders if LYRICA is discontinued rapidly. Do not stop abruptly unless you have a reaction to it. Antiepileptic drugs, including LYRICA, increase the risk of suicidal thoughts or behavior.   As discussed, Dr. Jaynee Eagles has kindly agreed to help out with diagnosis and management. You can follow up with me or with Dr. Jaynee Eagles, you can discuss with her at the time. She may want to do more blood work.   Please stop smoking, for your back pain, you are encouraged to see Dr. Louanne Skye.

## 2015-06-10 ENCOUNTER — Telehealth: Payer: Self-pay | Admitting: Neurology

## 2015-06-10 NOTE — Telephone Encounter (Signed)
I spoke to patient and I suggested trying the gabapentin again but he stated that it is too expensive. I offered him an appt with Dr. Jaynee Eagles as soon as tomorrow but patient wants to wait on both recommendations until after his disability hearing. He has been advised to call us back as soon as he would like to be seen.

## 2015-06-10 NOTE — Telephone Encounter (Signed)
Patient is calling as he needs a different medication from pregabalin (Lyricia)75 mg as this Rx is too expensive for him.  Please call!

## 2015-06-10 NOTE — Telephone Encounter (Signed)
Emma:  As discussed with Dr. Jaynee Eagles, she has kindly agreed to see patient. Can you make an appointment (will be new to her so 30 min slot).   Beverlee Nims:  In the meantime, he may be able to go back on Gabapentin which he should have from before. Can you check what dose he has? He could start with 300 mg at bedtime.

## 2015-06-14 ENCOUNTER — Ambulatory Visit: Payer: Self-pay | Admitting: Neurology

## 2015-07-15 ENCOUNTER — Ambulatory Visit: Payer: Self-pay | Attending: Specialist

## 2015-07-15 DIAGNOSIS — M545 Low back pain: Secondary | ICD-10-CM | POA: Insufficient documentation

## 2015-07-15 NOTE — Therapy (Signed)
[place], [place], [address] Phone: [phone]   Fax:  [phone]  Physical Therapy Evaluation  Patient Details  Name: Eduardo [name] MRN: [id number] Date of Birth: 27-May-1971 Referring Provider:  Jessy Oto, MD  Encounter Date: 07/15/2015      PT End of Session - 07/15/15 1218    PT Start Time 1152   PT Stop Time 1217   PT Time Calculation (min) 25 min   Behavior During Therapy [facility] for tasks assessed/performed      Past Medical History  Diagnosis Date  . Chronic back pain   . Degenerative disk disease   . Hypertension   . Seizures   . Pilonidal cyst   . Depression   . MRSA (methicillin resistant staph aureus) culture positive   . Peripheral neuropathy   . Anxiety     Past Surgical History  Procedure Laterality Date  . Wrist fusion  2002  . Thoracic outlet surgery  2000    There were no vitals filed for this visit.  Visit Diagnosis:  Bilateral low back pain, with sciatica presence unspecified      Subjective Assessment - 07/15/15 1216    Subjective Eduardo [name] reports chronic back and leg pain from neuropathy. He also reports degenerative changes.     He will try a relatives unit and if helpful will purchase unit for himself.                                        Plan - 07/15/15 1219    Clinical Impression Statement Eduardo [name] was  interested in home TENS unit. He has no insurance. TENS units here are consigned with a company and we don't have units to give away. He was directed to local  durable medical equipment. and [employer] and Agree with Plan of Care Patient         Problem List Patient Active Problem List   Diagnosis Date Noted  . Major depression 12/26/2012  . Generalized anxiety disorder 12/26/2012  . Opiate dependence 12/24/2012  . Benzodiazepine dependence 12/24/2012  . Benign essential hypertension  antepartum 12/24/2012  . 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 OBESITY 09/09/2010  . TOBACCO ABUSE 09/09/2010  . LOW BACK PAIN, CHRONIC 09/09/2010    Darrel Hoover PT 07/15/2015, 12:29 PM  [facility] [address] [place], [place], [address] Phone: [phone]   Fax:  [phone]

## 2015-08-13 ENCOUNTER — Ambulatory Visit: Payer: Self-pay | Admitting: Neurology

## 2015-09-23 ENCOUNTER — Telehealth: Payer: Self-pay | Admitting: Neurology

## 2015-09-23 NOTE — Telephone Encounter (Signed)
Patient would like a call back from Dr. Rexene Alberts - regarding his neuropathy wants to discuss it before making an apt. Best number to call is [phone]

## 2015-09-23 NOTE — Telephone Encounter (Signed)
I spoke to patient and he is aware of recommendation. We were able to make consult appt with Dr. Jaynee Eagles for tomorrow.

## 2015-09-23 NOTE — Telephone Encounter (Signed)
Patient advised me that he was turned down for disability for neuropathy. He felt that the court did not fully understand how severe his neuropathy is and his physical limitations because of it.  He would like to know if he came in for another appointment could there be documentation that his neuropathy causes gait problems for him. He reports having trouble standing and walking long distances. He would like further documentation and information on his future prognosis. He would like to know if his neuropathy will spread to his arms or other areas of his body? Also what should he expect to not be able to physically do in the future.  I advised him that I will speak with Dr. Rexene Alberts and call him back with advice.

## 2015-09-23 NOTE — Telephone Encounter (Signed)
I would like for [place] to pursue the recommendations I made at his last appointment, second opinion with Dr. Jaynee Eagles, who had kindly agreed to see him, but patient did not pursue the appointment at the time. Please facilitate appointment. I will copy Dr. Jaynee Eagles as well.

## 2015-09-24 ENCOUNTER — Ambulatory Visit (INDEPENDENT_AMBULATORY_CARE_PROVIDER_SITE_OTHER): Payer: Self-pay | Admitting: Neurology

## 2015-09-24 ENCOUNTER — Encounter: Payer: Self-pay | Admitting: Neurology

## 2015-09-24 VITALS — BP 170/92 | HR 81 | Ht 75.0 in | Wt 292.8 lb

## 2015-09-24 DIAGNOSIS — G629 Polyneuropathy, unspecified: Secondary | ICD-10-CM

## 2015-09-24 NOTE — Progress Notes (Signed)
[facility]    Provider:  Dr Jaynee Eagles Referring Provider: No ref. provider found Primary Care Physician:  Lorayne Marek, MD  CC: Neuropathy  HPI:  IVON [name] is a 44 y.o. male here as a second opinion from my colleague Dr. Rexene Alberts. He has a PMHx of chronic low back pain, hypertension, depression, smoking, and obesity.  He has been diagnosed with severe sensorimotor axonal polyneuropathy. He has had an extensive workup including multiple emg/ncs, extensive lab testing(TSH, RPR, CMP, B12, folate, vitamin D, hemoglobin A1c, heavy metal profile, methylmalonic acid, homocysteine level, vitamin B6, ANA, CRP, rheumatoid factor, IFE and SPEP, CK level and vitamin B1).. He reports a 7 year history of worsening pain in the feet, numbness, tingling and burning. The symptoms started with low back pain in 2009. The symptoms started with numbness in both feet, feels like they go to sleep, he gets sharp pains in his toes to his back, he has burning and tingling and they feel cold. He sees Dr. Louanne Skye for back pain. He says he has constant back pain and he also has neuropathy in the feet. He has been in pain for a long time and nothing seem to work. He saw Dr. Melina Schools as well. He has been on opiates for pain, he tried Cymbalta, tried gabapentin and neither worked. He has not tried Lyrica. He does not have insurance at all and can't afford the medication or any further workup. He can't stand on his feet for an hour at a time without severe pain making him sit. His feet really start hurting. If he works all day his feet and ankles will swell with severe pain. No history of any medications that can cause neuropathy (no long-term antibiotic use that can cause symptoms, never has been on chemotherapy, never been on heart medications such as amiodarone, never had gout or been treated for gout, never been exposed to toxins or other miscellaneous drugs that can cause neuropathy or other drugs know  causes polyneuropathy or extensive alcohol use in his past). He has sores on his legs, says it starts as a pimple and then gets bigger. He has never seen a dermatologist despite being recommended multiple times. He has fallen several times. Symptoms are progressive. It has been recommended in the past for him to see a dermatologist and a primary care physician. He says he can't afford it.   Reviewed notes, labs and imaging from outside physicians, which showed:  MRi of the lumbar spine 01/2015:personally reviewed images  L1-2: Mild degenerative change without stenosis L2-3: Negative L3-4: Mild disc degeneration and disc bulging. Small right foraminal disc protrusion has developed since the prior study. Right L3 nerve root appears to exit above the disc protrusion. Mild facet degeneration and mild spinal stenosis. L4-5: Mild disc degeneration. Small left lateral disc protrusion and osteophyte similar to the prior study. Mild facet hypertrophy has progressed. Slight spinal stenosis also has progressed. L5-S1: Small central disc protrusion unchanged from the prior study without neural impingement IMPRESSION: Small right foraminal disc protrusion L3-4, not present in 2005. Mild spinal stenosis at L3-4 Small left lateral disc protrusion and osteophyte L4-5 similar to the prior study. Slight progression of spinal stenosis at this level. Small central disc protrusion L5-S1 is unchanged.   EMG/NCS 05/23/2015:personally reviewed data IMPRESSION:  Abnormal study demonstrating: 1. Severe axonal sensorimotor polyneuropathy. 2. Absent F wave latency and H reflex responses raise possibility of underlying lumbar polyradiculopathy. 3. No abnormal acute or chronic denervation changes on  needle EMG study.  Notes from Dr. Rexene Alberts:  Eduardo [name] is a 44 year old right-handed gentleman with an underlying medical history of chronic low back pain, hypertension, depression, smoking, and obesity, who presents for  follow-up consultation of his numbness, tingling and burning sensation in both feet of over 2 years duration. The patient is unaccompanied today. I first met him on 05/09/2015 at the request of his orthopedic specialist, at which time the patient reported an approximately 2 year history of numbness, tingling and burning sensation of both feet. He felt his symptoms were progressive and right more than left. He noticed abnormal sensations such as cold feeling in his feet when they actually were warm. On examination, he did have decreased sensation to all modalities in the right foot more than left foot. A prior EMG and nerve conduction test apparently had shown evidence of axonal neuropathy. I suggested further workup for neuropathy. We did some blood work including TSH, RPR, CMP, B12, folate, vitamin D, hemoglobin A1c, heavy metal profile, methylmalonic acid, homocysteine level, vitamin B6, ANA, CRP, rheumatoid factor, IFE and SPEP, CK level and vitamin B1. We called him with his test results. His homocystine level was elevated but this was in the context of smoking. His CRP was elevated mildly. He had mild abnormality on his IFE and SPEP showed a polyclonal gammopathy. I suggested a referral to hematology for consultation but he declined. He had an EMG and nerve conduction study in our office on 05/23/2015: Abnormal study demonstrating: 1. Severe axonal sensorimotor polyneuropathy. 2. Absent F wave latency and H reflex responses raise possibility of underlying lumbar polyradiculopathy. 3. No abnormal acute or chronic denervation changes on needle EMG study. We called him with his test results.  Today, 06/07/2015: He reports ongoing issues with back pain. He has back pain from the mid to lower back on the right side. Sometimes he feels that his paraspinal muscles just want to twist. Sometimes when he lays down in bed he hears a pop and feels a pop in his lower back and his pain improves. His orthopedic doctor  advised him to see a pain management doctor. However, he states that he cannot afford it. He has not seen his primary care physician either. He has not seen a dermatologist. His foot pain is about the same. He endorses burning sensation and tingling in his feet bilaterally, right more than left.  Previously:  He reports an approximately 2 year history of numbness, tingling and burning sensation in both feet. He feels his symptoms have been progressive. His right foot is worse than left. Sometimes his feet feel very cold but when he touches them they feel warm. He has had skin lesions in his lower extremities but has not pursued any appointment with a dermatologist yet. He denies any personal history of diabetes. He denies any history of excessive alcohol use. In fact he reports not drinking alcohol at all at this time. He drinks caffeine in the form of [facility]. He smokes one pack a day. He lives alone. He has 2 grown children. He is currently not working. He does report having fractured his right big toe in the past.  He was recently seen in the emergency room on 03/03/2015 with a one-week history of worsening lower back pain and 2 falls reported, secondary to slipping on wet surfaces. I reviewed the emergency room records. X-ray lumbar spine and 4 views from 03/03/2015 showed: New benign appearing compression fracture of L1 since the prior MRI dated 01/28/2015.  He had an MRI lumbar spine without contrast on 01/28/2015: Small right foraminal disc protrusion L3-4, not present in 2005. Mild spinal stenosis at L3-4. Small left lateral disc protrusion and osteophyte L4-5 similar to the prior study. Slight progression of spinal stenosis at this level. Small central disc protrusion L5-S1 is unchanged. In addition, personally reviewed the images through the PACS system.  I reviewed your office notes from 04/24/2015, which you kindly included. Prior to that he saw Dr. Louanne Skye. The patient had an EMG and  nerve conduction study on 04/12/2015 which was interpreted by Dr. Louanne Skye, and I reviewed the report: The above electrodiagnostic study is abnormal and revealed evidence most consistent with significant axonal more than demyelinating peripheral neuropathy of the bilateral lower extremities. There is also evidence of a severe right peroneal nerve neuropathy affecting sensory and motor components. This may be more of a deep peroneal neuropathy. I cannot completely rule out a severe L5 radiculopathy but this would not go along with his MRI findings.  For his depression and foot pain he was on Cymbalta. He was on gabapentin up to 2400 mg daily. He has been on narcotic pain medication for his low back pain. About a month ago he stopped all his medications except for his narcotic pain medication. He was last seen by his primary care physician in January. He had some blood work done in January which revealed low vitamin D and he was given a prescription for vitamin D. He reports no family history of neuropathy. His mother died with complications of her diabetes and some form of systemic infection. None of his siblings have any known neuromuscular disease.  He reports ongoing issues with low back pain, at times radiating to the right leg.  Review of Systems: Patient complains of symptoms per HPI as well as the following symptoms: No CP, no SOB. Pertinent negatives per HPI. All others negative.   Social History   Social History  . Marital Status: Divorced    Spouse Name: [name]  . Number of Children: 2  . Years of Education: 12th    Occupational History  . [name]    Social History Main Topics  . Smoking status: Current Every Day Smoker -- 1.00 packs/day for 15 years    Types: Cigarettes  . Smokeless tobacco: Never Used  . Alcohol Use: No  . Drug Use: No     Comment: 2 weeks ago  . Sexual Activity: Not on file     Comment: last night 12/21/12   Other Topics Concern  . Not on file   Social History  Narrative   Drinks about 2-3 [place] a day    Family History  Problem Relation Age of Onset  . Cancer Maternal Grandmother     breast, ovarian & colon  . Hypertension Mother   . Diabetes Mother   . Hypertension Father   . Stroke Paternal Grandfather     Past Medical History  Diagnosis Date  . Chronic back pain   . Degenerative disk disease   . Hypertension   . Seizures   . Pilonidal cyst   . Depression   . MRSA (methicillin resistant staph aureus) culture positive   . Peripheral neuropathy   . Anxiety     Past Surgical History  Procedure Laterality Date  . Wrist fusion  2002  . Thoracic outlet surgery  2000    Current Outpatient Prescriptions  Medication Sig Dispense Refill  . Aspirin-Caffeine (BAYER BACK & BODY PO) Take  by mouth.    . pregabalin (LYRICA) 75 MG capsule 1 pill each bedtime for one week, then one pill twice daily thereafter. 60 capsule 2   No current facility-administered medications for this visit.    Allergies as of 09/24/2015  . (No Known Allergies)    Vitals: There were no vitals taken for this visit. Last Weight:  Wt Readings from Last 1 Encounters:  06/07/15 290 lb (131.543 kg)   Last Height:   Ht Readings from Last 1 Encounters:  06/07/15 6' 3"  (1.905 m)     General: NAD Skin: He has multiple hyperpigmented patches on his legs where he reports he has had sores up to knees. Sensory exam: decreased pin prick and temperature to the elbows and knees Few seconds vibration at the knees, absent at the medial malleolus and great toes Absent proprioception at the great toes Sway on romberg Reflexes: Toes equivocal Brisk reflexes in the lower extremities Strength: Mild dorsiflexion,leg flexion and hip flexion weakness on the right > left.   Assessment/Plan:   44 y.o. male here as a second opinion from my colleague Dr. Rexene Alberts. He has a PMHx of chronic low back pain, hypertension, depression, smoking, and obesity.  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 He declines trying any new medications today or further workup with labs and dermatology due to financial constraints. Would recommend trying Lyrica and labs including HIV, pan-anca, hepatitis C, Lyme. Given his skin findings, I highly encouraged primary care and dermatology followup for infections and other causes. May also benefit by a biopsy to evaluate for vasculitis.   Sarina Ill, MD  [facility] [address] [place], [place] [address]  Phone [phone] Fax [phone]  A total of 30 minutes was spent face-to-face with this patient. Over half this time was spent on counseling patient on the sensorimotor axonal length-dependent polyneuropathy  diagnosis and different diagnostic and therapeutic options available.

## 2015-09-28 ENCOUNTER — Encounter: Payer: Self-pay | Admitting: Neurology

## 2015-09-28 DIAGNOSIS — G609 Hereditary and idiopathic neuropathy, unspecified: Secondary | ICD-10-CM | POA: Insufficient documentation

## 2015-09-28 DIAGNOSIS — G629 Polyneuropathy, unspecified: Secondary | ICD-10-CM | POA: Insufficient documentation

## 2015-09-30 ENCOUNTER — Telehealth: Payer: Self-pay | Admitting: Neurology

## 2015-09-30 NOTE — Telephone Encounter (Signed)
I spoke to patient and he is aware of these recommendations. He states that at this point since he has been denied disability he cannot afford to go to dermatologist, get blood work done, or afford Lyrica. He will call us back if anything changes.

## 2015-09-30 NOTE — Telephone Encounter (Signed)
Please call patient with the recommendations as per Dr. Cathren Laine eval: I would recommend dermatology referral, additional blood work for which he can come in anytime and we can try him again on low-dose Lyrica if he wishes to try it. I will see him back in about 3 months, please make follow-up appointment as well and if he is okay with a referral and blood work we will place orders for those.

## 2015-11-22 ENCOUNTER — Telehealth: Payer: Self-pay | Admitting: Neurology

## 2015-11-22 NOTE — Telephone Encounter (Signed)
Per Dr Rexene Alberts- pt needs to schedule f/u with her to discuss treatment and treatment options.

## 2015-11-22 NOTE — Telephone Encounter (Signed)
Patient called to request samples of LYRICA, states he can't afford this medication right now.

## 2015-11-22 NOTE — Telephone Encounter (Signed)
Called pt back. Advised per Dr Rexene Alberts he needs to come in for f/u appt to discuss treatment/options. He declined and stated he received referral from different provider to go to [facility] to see a neurologist there. He will call back to make f/u in the future if needed.

## 2015-11-22 NOTE — Telephone Encounter (Signed)
Called pt pharmacy [facility]). Pt has never picked up rx of Lyrica. Not on file at pharmacy. Spoke to [place].

## 2016-01-14 ENCOUNTER — Emergency Department (HOSPITAL_COMMUNITY)
Admission: EM | Admit: 2016-01-14 | Discharge: 2016-01-15 | Disposition: A | Discharge status: Home / Self Care | Payer: Self-pay | Attending: Emergency Medicine | Admitting: Emergency Medicine

## 2016-01-14 ENCOUNTER — Encounter (HOSPITAL_COMMUNITY): Payer: Self-pay | Admitting: *Deleted

## 2016-01-14 ENCOUNTER — Emergency Department (HOSPITAL_COMMUNITY)
Admission: EM | Admit: 2016-01-14 | Discharge: 2016-01-14 | Disposition: A | Discharge status: Home / Self Care | Payer: Self-pay | Attending: Emergency Medicine | Admitting: Emergency Medicine

## 2016-01-14 ENCOUNTER — Encounter (HOSPITAL_COMMUNITY): Payer: Self-pay | Admitting: Emergency Medicine

## 2016-01-14 DIAGNOSIS — G8929 Other chronic pain: Secondary | ICD-10-CM | POA: Insufficient documentation

## 2016-01-14 DIAGNOSIS — I10 Essential (primary) hypertension: Secondary | ICD-10-CM | POA: Insufficient documentation

## 2016-01-14 DIAGNOSIS — L089 Local infection of the skin and subcutaneous tissue, unspecified: Secondary | ICD-10-CM | POA: Insufficient documentation

## 2016-01-14 DIAGNOSIS — F17210 Nicotine dependence, cigarettes, uncomplicated: Secondary | ICD-10-CM | POA: Insufficient documentation

## 2016-01-14 DIAGNOSIS — L0231 Cutaneous abscess of buttock: Secondary | ICD-10-CM | POA: Insufficient documentation

## 2016-01-14 DIAGNOSIS — Z8614 Personal history of Methicillin resistant Staphylococcus aureus infection: Secondary | ICD-10-CM | POA: Insufficient documentation

## 2016-01-14 DIAGNOSIS — Z8659 Personal history of other mental and behavioral disorders: Secondary | ICD-10-CM | POA: Insufficient documentation

## 2016-01-14 NOTE — ED Notes (Signed)
Pt states "i get bumps that come up and turn into massive sores, right now i've got one on my butt". Pt has very large abscess like sore on L buttocks, area roughly 4 inches in size with reddened edges. Pt states "i feel like im running a fever". Pt denies diabetes. Pt states hes had MRSA before. Pt in NAD. VSS.

## 2016-01-14 NOTE — ED Notes (Signed)
Pt with abscess to left buttock area; pt states that it started draining and hurting on Fri; pt states that the area has progressively gotten worse since Fri; pt states that it has been draining and is painful

## 2016-01-14 NOTE — ED Notes (Signed)
Staffing being advised pt left.

## 2016-01-15 MED ORDER — OXYCODONE-ACETAMINOPHEN 5-325 MG PO TABS
2.0000 | ORAL_TABLET | Freq: Once | ORAL | Status: AC
  Administered 2016-01-15: 2 via ORAL
  Filled 2016-01-15: qty 2

## 2016-01-15 MED ORDER — OXYCODONE-ACETAMINOPHEN 5-325 MG PO TABS: 1.0000 | ORAL_TABLET | ORAL | Status: DC | PRN

## 2016-01-15 MED ORDER — CLINDAMYCIN HCL 300 MG PO CAPS
300.0000 mg | ORAL_CAPSULE | Freq: Once | ORAL | Status: AC
  Administered 2016-01-15: 300 mg via ORAL
  Filled 2016-01-15: qty 1

## 2016-01-15 MED ORDER — LIDOCAINE-EPINEPHRINE (PF) 2 %-1:200000 IJ SOLN
20.0000 mL | Freq: Once | INTRAMUSCULAR | Status: AC
  Administered 2016-01-15: 20 mL
  Filled 2016-01-15: qty 20

## 2016-01-15 MED ORDER — CLINDAMYCIN HCL 150 MG PO CAPS: 300.0000 mg | ORAL_CAPSULE | Freq: Three times a day (TID) | ORAL | Status: DC

## 2016-01-15 NOTE — ED Provider Notes (Signed)
CSN: [id number]     Arrival date & time 01/14/16  2229 History   First MD Initiated Contact with Patient 01/15/16 [phone]     Chief Complaint  Patient presents with  . Abscess     (Consider location/radiation/quality/duration/timing/severity/associated sxs/prior Treatment) Patient is a 45 y.o. male presenting with abscess. The history is provided by the patient. No language interpreter was used.  Abscess Location:  Ano-genital Ano-genital abscess location:  L buttock Abscess quality: draining, induration, painful and redness   Red streaking: no   Pain details:    Duration:  4 days Associated symptoms: no fever   Associated symptoms comment:  Patient with a history of recurrent skin abscesses with painful swelling to left buttock c/w previous abscesses. No fever. He reports the area is draining but continues to be painful and grow bigger.    Past Medical History  Diagnosis Date  . Chronic back pain   . Degenerative disk disease   . Hypertension   . Pilonidal cyst   . Depression   . MRSA (methicillin resistant staph aureus) culture positive   . Peripheral neuropathy ([place])   . Anxiety    Past Surgical History  Procedure Laterality Date  . Wrist fusion  2002  . Thoracic outlet surgery  2000   Family History  Problem Relation Age of Onset  . Cancer Maternal Grandmother     breast, ovarian & colon  . Hypertension Mother   . Diabetes Mother   . Hypertension Father   . Stroke Paternal Grandfather   . Neuropathy Neg Hx    Social History  Substance Use Topics  . Smoking status: Current Every Day Smoker -- 1.00 packs/day for 15 years    Types: Cigarettes  . Smokeless tobacco: Never Used  . Alcohol Use: No    Review of Systems  Constitutional: Negative for fever.  Musculoskeletal: Negative for myalgias.  Skin: Positive for wound.      Allergies  Review of patient's allergies indicates no known allergies.  Home Medications   Prior to Admission medications    Medication Sig Start Date End Date Taking? Authorizing Provider  Acetaminophen (TYLENOL) 325 MG CAPS Take 650-1,300 mg by mouth every 6 (six) hours as needed (pain).    Yes Historical Provider, MD   BP 140/88 mmHg  Pulse 102  Temp(Src) 98.7 F (37.1 C) (Oral)  Resp 18  SpO2 96% Physical Exam  Constitutional: He appears well-developed and well-nourished. No distress.  Pulmonary/Chest: Effort normal.  Skin:  Area of redness measuring 4 cm x 5 cm to upper left buttock, with induration and central purulent ulceration. No active drainage.     ED Course  Procedures (including critical care time) Labs Review Labs Reviewed - No data to display  Imaging Review No results found. I have personally reviewed and evaluated these images and lab results as part of my medical decision-making.   EKG Interpretation None     INCISION AND DRAINAGE Performed by: Charlann Lange A Consent: Verbal consent obtained. Risks and benefits: risks, benefits and alternatives were discussed Type: abscess  Body area: left upper buttock  Anesthesia: local infiltration  Incision was made with a scalpel.  Local anesthetic: lidocaine 2% wepinephrine  Anesthetic total: 3 ml  Complexity: complex Blunt dissection to break up loculations  Drainage: purulent  Drainage amount: moderate  Packing material: 1/2 in iodoform gauze  Patient tolerance: Patient tolerated the procedure well with no immediate complications.    MDM   Final diagnoses:  None  1. Cutaneous abscess  Uncomplicated recurrent skin abscess without surrounding cellulitic changes. I&D per above note. Will start on abx, provide pain management. Instructed to have 2 day recheck of the area.     Charlann Lange, PA-C 01/15/16 0422  Orpah Greek, MD 01/16/16 [phone]

## 2016-01-15 NOTE — Discharge Instructions (Signed)

## 2016-01-17 ENCOUNTER — Ambulatory Visit: Payer: Self-pay | Attending: Family Medicine | Admitting: Family Medicine

## 2016-01-17 ENCOUNTER — Encounter: Payer: Self-pay | Admitting: Family Medicine

## 2016-01-17 VITALS — BP 136/85 | HR 83 | Temp 98.5°F | Resp 16 | Ht 76.0 in | Wt 304.0 lb

## 2016-01-17 DIAGNOSIS — L0231 Cutaneous abscess of buttock: Secondary | ICD-10-CM | POA: Insufficient documentation

## 2016-01-17 DIAGNOSIS — G629 Polyneuropathy, unspecified: Secondary | ICD-10-CM | POA: Insufficient documentation

## 2016-01-17 DIAGNOSIS — Z9889 Other specified postprocedural states: Secondary | ICD-10-CM | POA: Insufficient documentation

## 2016-01-17 DIAGNOSIS — L732 Hidradenitis suppurativa: Secondary | ICD-10-CM | POA: Insufficient documentation

## 2016-01-17 DIAGNOSIS — F411 Generalized anxiety disorder: Secondary | ICD-10-CM | POA: Insufficient documentation

## 2016-01-17 DIAGNOSIS — Z872 Personal history of diseases of the skin and subcutaneous tissue: Secondary | ICD-10-CM | POA: Insufficient documentation

## 2016-01-17 MED ORDER — HYDROXYZINE HCL 25 MG PO TABS: 25.0000 mg | ORAL_TABLET | Freq: Three times a day (TID) | ORAL | Status: DC | PRN

## 2016-01-17 MED ORDER — OXYCODONE-ACETAMINOPHEN 5-325 MG PO TABS: 1.0000 | ORAL_TABLET | ORAL | Status: DC | PRN

## 2016-01-17 NOTE — Progress Notes (Signed)
Subjective:  Patient ID: Eduardo [name], male    DOB: 1971/09/21  Age: 45 y.o. MRN: [id number]  CC: Hospitalization Follow-up   HPI Eduardo [name] presents for an ED follow-up of left upper buttock abscess status post incision and drainage at the ED 2 days ago after he was placed on clindamycin and Percocet. He complains of pain which is up to 6/10 and is controlled on Percocet which he is almost running out of; he states the abscess continues to drain; he denies fever. He does have a history of developing multiple boils and abscesses in different locations of his body and this has been going on for the last few years.  He has a history of anxiety and is not currently on any medications; he things when his anxiety levels. He breaks out in boils that she begins to pick at with resulting development of abscesses. He was seen in 09/2015 by [facility] for sensorimotor axonal polyneuropathy of unknown etiology which has not responded to Lyrica and is currently not on any medications.  Outpatient Prescriptions Prior to Visit  Medication Sig Dispense Refill  . Acetaminophen (TYLENOL) 325 MG CAPS Take 650-1,300 mg by mouth every 6 (six) hours as needed (pain).     . clindamycin (CLEOCIN) 150 MG capsule Take 2 capsules (300 mg total) by mouth 3 (three) times daily. 60 capsule 0  . oxyCODONE-acetaminophen (PERCOCET/ROXICET) 5-325 MG tablet Take 1-2 tablets by mouth every 4 (four) hours as needed for severe pain. 15 tablet 0   No facility-administered medications prior to visit.    ROS Review of Systems  Constitutional: Negative for activity change and appetite change.  HENT: Negative for sinus pressure and sore throat.   Respiratory: Negative for chest tightness, shortness of breath and wheezing.   Cardiovascular: Negative for chest pain and palpitations.  Gastrointestinal: Negative for abdominal pain, constipation and abdominal distention.  Genitourinary:  Negative.   Musculoskeletal: Negative.   Skin:       See hpi  Psychiatric/Behavioral: Negative for behavioral problems and dysphoric mood.    Objective:  BP 136/85 mmHg  Pulse 83  Temp(Src) 98.5 F (36.9 C) (Oral)  Resp 16  Ht 6\' 4"  (1.93 m)  Wt 304 lb (137.893 kg)  BMI 37.02 kg/m2  SpO2 96%  BP/Weight 01/17/2016 01/15/2016 [id number]  Systolic BP XX123456 [id number]  Diastolic BP 85 89 98  Wt. (Lbs) 304 - -  BMI 37.02 - -      Physical Exam  Constitutional: He is oriented to person, place, and time. He appears well-developed and well-nourished.  Cardiovascular: Normal rate, normal heart sounds and intact distal pulses.   No murmur heard. Pulmonary/Chest: Effort normal and breath sounds normal. He has no wheezes. He has no rales. He exhibits no tenderness.  Abdominal: Soft. Bowel sounds are normal. He exhibits no distension and no mass. There is no tenderness.  Musculoskeletal: Normal range of motion.  Neurological: He is alert and oriented to person, place, and time.  Skin:  Left upper buttock abscess with clear discharge and surrounding erythema; tender to palpation     Assessment & Plan:   1. Generalized anxiety disorder Placed on hydroxyzine-discussed sedating side effects  2. Hydradenitis Explained to the patient that this is likely a chronic condition  3. Left gluteal abscess Dressing change performed in the clinic Continue clindamycin and Percocet. We'll see back in 2 weeks to follow-up for improvement   Meds ordered this encounter  Medications  . oxyCODONE-acetaminophen (  PERCOCET/ROXICET) 5-325 MG tablet    Sig: Take 1-2 tablets by mouth every 4 (four) hours as needed for severe pain.    Dispense:  15 tablet    Refill:  0  . hydrOXYzine (ATARAX/VISTARIL) 25 MG tablet    Sig: Take 1 tablet (25 mg total) by mouth 3 (three) times daily as needed.    Dispense:  90 tablet    Refill:  0    Follow-up: Return in about 2 weeks (around 01/31/2016) for follow up of  buttock abscess.   Arnoldo Morale MD

## 2016-01-17 NOTE — Patient Instructions (Signed)

## 2016-01-17 NOTE — Progress Notes (Signed)
HFU abscess on bottom Pain scale #6 Tobacco user 1ppday  No suicidal thoughts in the past two weeks

## 2016-01-22 MED FILL — ?HYDROXYZINE HCL 25 MG TAB: 25 | 90 days supply | Qty: 90 | Fill #0

## 2016-02-07 ENCOUNTER — Ambulatory Visit: Payer: Self-pay | Admitting: Family Medicine

## 2016-12-30 ENCOUNTER — Encounter (INDEPENDENT_AMBULATORY_CARE_PROVIDER_SITE_OTHER): Payer: Self-pay

## 2016-12-30 ENCOUNTER — Ambulatory Visit (INDEPENDENT_AMBULATORY_CARE_PROVIDER_SITE_OTHER): Payer: Managed Care, Other (non HMO) | Admitting: Neurology

## 2016-12-30 ENCOUNTER — Encounter: Payer: Self-pay | Admitting: Neurology

## 2016-12-30 VITALS — BP 144/91 | HR 84 | Ht 76.0 in | Wt 319.6 lb

## 2016-12-30 DIAGNOSIS — G609 Hereditary and idiopathic neuropathy, unspecified: Secondary | ICD-10-CM | POA: Diagnosis not present

## 2016-12-30 DIAGNOSIS — E538 Deficiency of other specified B group vitamins: Secondary | ICD-10-CM | POA: Diagnosis not present

## 2016-12-30 MED ORDER — PREGABALIN 300 MG PO CAPS: 300.0000 mg | ORAL_CAPSULE | Freq: Two times a day (BID) | ORAL | 5 refills | Status: DC

## 2016-12-30 NOTE — Patient Instructions (Signed)
Remember to drink plenty of fluid, eat healthy meals and do not skip any meals. Try to eat protein with a every meal and eat a healthy snack such as fruit or nuts in between meals. Try to keep a regular sleep-wake schedule and try to exercise daily, particularly in the form of walking, 20-30 minutes a day, if you can.   As far as your medications are concerned, I would like to suggest: A multi-B vitamin daily  As far as diagnostic testing: EMG/NCS of the upper extremities  I would like to see you back in 4-6 months with Dr. Rexene Alberts, sooner if we need to. Please call us with any interim questions, concerns, problems, updates or refill requests. .   Our phone number is [phone]. We also have an after hours call service for urgent matters and there is a physician on-call for urgent questions. For any emergencies you know to call 911 or go to the nearest emergency room   Vitamin B12 Deficiency Vitamin B12 deficiency occurs when the body does not have enough vitamin B12. Vitamin B12 is an important vitamin. The body needs vitamin B12:  To make red blood cells.  To make DNA. This is the genetic material inside cells.  To help the nerves work properly so they can carry messages from the brain to the body. Vitamin B12 deficiency can cause various health problems, such as a low red blood cell count (anemia) or nerve damage. What are the causes? This condition may be caused by:  Not eating enough foods that contain vitamin B12.  Not having enough stomach acid and digestive fluids to properly absorb vitamin B12 from the food that you eat.  Certain digestive system diseases that make it hard to absorb vitamin B12. These diseases include Crohn disease, chronic pancreatitis, and cystic fibrosis.  Pernicious anemia. This is a condition in which the body does not make enough of a protein (intrinsic factor), resulting in too few red blood cells.  Having a surgery in which part of the stomach or small  intestine is removed.  Taking certain medicines that make it hard for the body to absorb vitamin B12. These medicines include:  Heartburn medicine (antacids and proton pump inhibitors).  An antibiotic medicine called neomycin.  Some medicines that are used to treat diabetes, tuberculosis, gout, or high cholesterol. What increases the risk? The following factors may make you more likely to develop a B12 deficiency:  Being older than age 8.  Eating a vegetarian or vegan diet, especially while you are pregnant.  Eating a poor diet while you are pregnant.  Taking certain drugs.  Having alcoholism. What are the signs or symptoms? In some cases, there are no symptoms of this condition. If the condition leads to anemia or nerve damage, various symptoms can occur, such as:  Weakness.  Fatigue.  Loss of appetite.  Weight loss.  Numbness or tingling in your hands and feet.  Redness and burning of the tongue.  Confusion or memory problems.  Depression.  Sensory problems, such as color blindness, ringing in the ears, or loss of taste.  Diarrhea or constipation.  Trouble walking. If anemia is severe, symptoms can include:  Shortness of breath.  Dizziness.  Rapid heart rate (tachycardia). How is this diagnosed? This condition may be diagnosed with a blood test to measure the level of vitamin B12 in your blood. You may have other tests to help find the cause of your vitamin B12 deficiency. These tests may include:  A  complete blood count (CBC). This is a group of tests that measure certain characteristics of blood cells.  A blood test to measure intrinsic factor.  An endoscopy. In this procedure, a thin tube with a camera on the end is used to look into your stomach or intestines. How is this treated? Treatment for this condition depends on the cause. Common treatment options include:  Changing your eating and drinking habits, such as:  Eating more foods that  contain vitamin B12.  Drinking less alcohol or no alcohol.  Taking vitamin B12 supplements. Your health care provider will tell you which dosage is best for you.  Getting vitamin B12 injections. Follow these instructions at home:  Take supplements only as told by your health care provider. Follow the directions carefully.  Get any injections that are prescribed by your health care provider.  Do not miss your appointments.  Eat lots of healthy foods that contain vitamin B12. Ask your health care provider if you should work with a dietitian. Foods that contain vitamin B12 include:  Meat.  Meat from birds (poultry).  Fish.  Eggs.  Cereal and dairy products that are fortified. This means that vitamin B12 has been added to the food. Check the label on the package to see if the food is fortified.  Do not abuse alcohol.  Keep all follow-up visits as told by your health care provider. This is important. Contact a health care provider if:  Your symptoms come back. Get help right away if:  You develop shortness of breath.  You have chest pain.  You become dizzy or you lose consciousness. This information is not intended to replace advice given to you by your health care provider. Make sure you discuss any questions you have with your health care provider. Document Released: 12/28/2011 Document Revised: 03/18/2016 Document Reviewed: 02/20/2015 Elsevier Interactive Patient Education  2017 [employer].

## 2016-12-30 NOTE — Progress Notes (Signed)
[facility]    Provider:  Dr Eduardo [name] Referring Provider: Arnoldo Morale, MD Primary Care Physician:  Eduardo Morale, MD  CC: Neuropathy  Interval history 12/30/2016: He is here for follow up.  Lyrica is helping his legs. Neuropathy is however worsening. He can't feel anything with his left arm and has  and numbness and tingling in both hands. Continuously numb and tingly. Digits 3-4 are the worst and the left hand is the worst. No neck pain or radiculopathy. He has never had an EMG on the upper extremities. Rare alcohol use. Will order labs today to complete neuropathy workup. Cont Lyrica. EMG/NCS of upper extremities to assess for CTS vs neuropathy.   HPI:  Eduardo [name] is a 46 y.o. male here as a second opinion from my colleague Dr. Rexene [name] He has a PMHx of chronic low back pain, hypertension, depression, smoking, and obesity.  He has been diagnosed with severe sensorimotor axonal polyneuropathy. He has had an extensive workup including multiple emg/ncs, extensive lab testing(TSH, RPR, CMP, B12, folate, vitamin D, hemoglobin A1c, heavy metal profile, methylmalonic acid, homocysteine level, vitamin B6, ANA, CRP, rheumatoid factor, IFE and SPEP, CK level and vitamin B1).. He reports a 7 year history of worsening pain in the feet, numbness, tingling and burning. The symptoms started with low back pain in 2009. The symptoms started with numbness in both feet, feels like they go to sleep, he gets sharp pains in his toes to his back, he has burning and tingling and they feel cold. He sees Dr. Louanne [name] for back pain. He says he has constant back pain and he also has neuropathy in the feet. He has been in pain for a long time and nothing seem to work. He saw Dr. Melina [name] as well. He has been on opiates for pain, he tried Cymbalta, tried gabapentin and neither worked. He has not tried Lyrica. He does not have insurance at all and can't afford the medication or any further workup. He  can't stand on his feet for an hour at a time without severe pain making him sit. His feet really start hurting. If he works all day his feet and ankles will swell with severe pain. No history of any medications that can cause neuropathy (no long-term antibiotic use that can cause symptoms, never has been on chemotherapy, never been on heart medications such as amiodarone, never had gout or been treated for gout, never been exposed to toxins or other miscellaneous drugs that can cause neuropathy or other drugs know causes polyneuropathy or extensive alcohol use in his past). He has sores on his legs, says it starts as a pimple and then gets bigger. He has never seen a dermatologist despite being recommended multiple times. He has fallen several times. Symptoms are progressive. It has been recommended in the past for him to see a dermatologist and a primary care physician. He says he can't afford it.   Reviewed notes, labs and imaging from outside physicians, which showed:  MRi of the lumbar spine 01/2015:personally reviewed images  L1-2: Mild degenerative change without stenosis L2-3: Negative L3-4: Mild disc degeneration and disc bulging. Small right foraminal disc protrusion has developed since the prior study. Right L3 nerve root appears to exit above the disc protrusion. Mild facet degeneration and mild spinal stenosis. L4-5: Mild disc degeneration. Small left lateral disc protrusion and osteophyte similar to the prior study. Mild facet hypertrophy has progressed. Slight spinal stenosis also has progressed. L5-S1: Small central disc protrusion  unchanged from the prior study without neural impingement IMPRESSION: Small right foraminal disc protrusion L3-4, not present in 2005. Mild spinal stenosis at L3-4 Small left lateral disc protrusion and osteophyte L4-5 similar to the prior study. Slight progression of spinal stenosis at this level. Small central disc protrusion L5-S1 is  unchanged.   EMG/NCS 05/23/2015:personally reviewed data IMPRESSION:  Abnormal study demonstrating: 1. Severe axonal sensorimotor polyneuropathy. 2. Absent F wave latency and H reflex responses raise possibility of underlying lumbar polyradiculopathy. 3. No abnormal acute or chronic denervation changes on needle EMG study.  Notes from Dr. Rexene [name]:  Eduardo [name] is a 46 year old right-handed gentleman with an underlying medical history of chronic low back pain, hypertension, depression, smoking, and obesity, who presents for follow-up consultation of his numbness, tingling and burning sensation in both feet of over 2 years duration. The patient is unaccompanied today. I first met him on 05/09/2015 at the request of his orthopedic specialist, at which time the patient reported an approximately 2 year history of numbness, tingling and burning sensation of both feet. He felt his symptoms were progressive and right more than left. He noticed abnormal sensations such as cold feeling in his feet when they actually were warm. On examination, he did have decreased sensation to all modalities in the right foot more than left foot. A prior EMG and nerve conduction test apparently had shown evidence of axonal neuropathy. I suggested further workup for neuropathy. We did some blood work including TSH, RPR, CMP, B12, folate, vitamin D, hemoglobin A1c, heavy metal profile, methylmalonic acid, homocysteine level, vitamin B6, ANA, CRP, rheumatoid factor, IFE and SPEP, CK level and vitamin B1. We called him with his test results. His homocystine level was elevated but this was in the context of smoking. His CRP was elevated mildly. He had mild abnormality on his IFE and SPEP showed a polyclonal gammopathy. I suggested a referral to hematology for consultation but he declined. He had an EMG and nerve conduction study in our office on 05/23/2015: Abnormal study demonstrating: 1. Severe axonal sensorimotor  polyneuropathy. 2. Absent F wave latency and H reflex responses raise possibility of underlying lumbar polyradiculopathy. 3. No abnormal acute or chronic denervation changes on needle EMG study. We called him with his test results.  Today, 06/07/2015: He reports ongoing issues with back pain. He has back pain from the mid to lower back on the right side. Sometimes he feels that his paraspinal muscles just want to twist. Sometimes when he lays down in bed he hears a pop and feels a pop in his lower back and his pain improves. His orthopedic doctor advised him to see a pain management doctor. However, he states that he cannot afford it. He has not seen his primary care physician either. He has not seen a dermatologist. His foot pain is about the same. He endorses burning sensation and tingling in his feet bilaterally, right more than left.  Previously:  He reports an approximately 2 year history of numbness, tingling and burning sensation in both feet. He feels his symptoms have been progressive. His right foot is worse than left. Sometimes his feet feel very cold but when he touches them they feel warm. He has had skin lesions in his lower extremities but has not pursued any appointment with a dermatologist yet. He denies any personal history of diabetes. He denies any history of excessive alcohol use. In fact he reports not drinking alcohol at all at this time. He drinks caffeine in the form  of [facility]. He smokes one pack a day. He lives alone. He has 2 grown children. He is currently not working. He does report having fractured his right big toe in the past.  He was recently seen in the emergency room on 03/03/2015 with a one-week history of worsening lower back pain and 2 falls reported, secondary to slipping on wet surfaces. I reviewed the emergency room records. X-ray lumbar spine and 4 views from 03/03/2015 showed: New benign appearing compression fracture of L1 since the prior MRI dated  01/28/2015.  He had an MRI lumbar spine without contrast on 01/28/2015: Small right foraminal disc protrusion L3-4, not present in 2005. Mild spinal stenosis at L3-4. Small left lateral disc protrusion and osteophyte L4-5 similar to the prior study. Slight progression of spinal stenosis at this level. Small central disc protrusion L5-S1 is unchanged. In addition, personally reviewed the images through the PACS system.  I reviewed your office notes from 04/24/2015, which you kindly included. Prior to that he saw Dr. Louanne [name] The patient had an EMG and nerve conduction study on 04/12/2015 which was interpreted by Dr. Louanne [name], and I reviewed the report: The above electrodiagnostic study is abnormal and revealed evidence most consistent with significant axonal more than demyelinating peripheral neuropathy of the bilateral lower extremities. There is also evidence of a severe right peroneal nerve neuropathy affecting sensory and motor components. This may be more of a deep peroneal neuropathy. I cannot completely rule out a severe L5 radiculopathy but this would not go along with his MRI findings.  For his depression and foot pain he was on Cymbalta. He was on gabapentin up to 2400 mg daily. He has been on narcotic pain medication for his low back pain. About a month ago he stopped all his medications except for his narcotic pain medication. He was last seen by his primary care physician in January. He had some blood work done in January which revealed low vitamin D and he was given a prescription for vitamin D. He reports no family history of neuropathy. His mother died with complications of her diabetes and some form of systemic infection. None of his siblings have any known neuromuscular disease.  He reports ongoing issues with low back pain, at times radiating to the right leg.  Review of Systems: Patient complains of symptoms per HPI as well as the following symptoms: No CP, no SOB. Pertinent negatives  per HPI. All others negative.  Social History   Social History  . Marital status: Divorced    Spouse name: [name]  . Number of children: 2  . Years of education: 12th    Occupational History  . [name]    Social History Main Topics  . Smoking status: Current Every Day Smoker    Packs/day: 1.00    Years: 15.00    Types: Cigarettes  . Smokeless tobacco: Never Used  . Alcohol use No     Comment: Rarely  . Drug use: No  . Sexual activity: Not on file   Other Topics Concern  . Not on file   Social History Narrative   Lives at home w/ his step-mother   Right-handed   Drinks about 2-3 [place] a day    Family History  Problem Relation Age of Onset  . Cancer Maternal Grandmother     breast, ovarian & colon  . Hypertension Mother   . Diabetes Mother   . Hypertension Father   . Heart disease Father   . Stroke  Paternal Grandfather   . Neuropathy Neg Hx     Past Medical History:  Diagnosis Date  . Anxiety   . Chronic back pain   . Degenerative disk disease   . Depression   . Hypertension   . MRSA (methicillin resistant staph aureus) culture positive   . Peripheral neuropathy ([place])   . Pilonidal cyst     Past Surgical History:  Procedure Laterality Date  . THORACIC OUTLET SURGERY  2000  . WRIST FUSION  2002    Current Outpatient Prescriptions  Medication Sig Dispense Refill  . Acetaminophen (TYLENOL ARTHRITIS PAIN PO) Take 2 tablets by mouth 3 (three) times daily with meals as needed.    . Multiple Vitamins-Minerals (MULTIVITAMIN WITH MINERALS) tablet Take 1 tablet by mouth daily.     No current facility-administered medications for this visit.     Allergies as of 12/30/2016  . (No Known Allergies)    Vitals: BP (!) 144/91   Pulse 84   Ht 6' 4"  (1.93 m)   Wt (!) 319 lb 9.6 oz (145 kg)   BMI 38.90 kg/m  Last Weight:  Wt Readings from Last 1 Encounters:  12/30/16 (!) 319 lb 9.6 oz (145 kg)   Last Height:   Ht Readings from Last 1 Encounters:  12/30/16  6' 4"  (1.93 m)    General: NAD Skin: He has multiple hyperpigmented patches on his legs where he reports he has had sores up to knees. Sensory exam: decreased pin prick and temperature to the elbows and knees Few seconds vibration at the knees, absent at the medial malleolus and great toes Absent proprioception at the great toes Sway on romberg Reflexes: Toes equivocal Brisk reflexes in the lower extremities Strength: Mild dorsiflexion,leg flexion and hip flexion weakness on the right > left.   Assessment/Plan:   46 y.o. male here as a second opinion from my colleague Dr. Rexene [name] He has a PMHx of chronic low back pain, hypertension, depression, smoking, and obesity.  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   Will order labs today to complete neuropathy workup(see below). Cont Lyrica. EMG/NCS of upper extremities to assess for CTS vs neuropathy.   Orders Placed This Encounter  Procedures  . Angiotensin converting enzyme  . Sjogren's syndrome antibods(ssa + ssb)  . Pan-ANCA  . Hepatitis C antibody  . Vitamin E  . Cryoglobulin  . B. burgdorfi Antibody  . HIV antibody  . B12 and Folate Panel  . Methylmalonic acid, serum  . Comprehensive metabolic panel  . CBC  . NCV with EMG(electromyography)    Sarina Ill, MD  [facility] [address] [place], [place] [address]  Phone [phone] Fax [phone]  A total of 25 minutes was spent face-to-face with this patient. Over half this time was spent on counseling patient on the sensorimotor axonal length-dependent polyneuropathy  diagnosis and different diagnostic and therapeutic options available.

## 2017-01-06 ENCOUNTER — Telehealth: Payer: Self-pay | Admitting: *Deleted

## 2017-01-06 NOTE — Telephone Encounter (Signed)
Per Dr Ahern, LVM informing patient all his lab results are normal. Left number for any questions. 

## 2017-01-07 LAB — PAN-ANCA
ANCA Proteinase 3: <3.5 U/mL (ref 0.0–3.5)
Atypical pANCA: <1:20 {titer}
C-ANCA: <1:20 {titer}
Myeloperoxidase Ab: <9 U/mL (ref 0.0–9.0)
P-ANCA: <1:20 {titer}

## 2017-01-07 LAB — HEPATITIS C ANTIBODY: Hep C Virus Ab: <0.1 s/co ratio (ref 0.0–0.9)

## 2017-01-07 LAB — COMPREHENSIVE METABOLIC PANEL
ALT: 23 IU/L (ref 0–44)
AST: 17 IU/L (ref 0–40)
Albumin/Globulin Ratio: 1.6 (ref 1.2–2.2)
Albumin: 4.5 g/dL (ref 3.5–5.5)
Alkaline Phosphatase: 100 IU/L (ref 39–117)
BUN/Creatinine Ratio: 14 (ref 9–20)
BUN: 14 mg/dL (ref 6–24)
Bilirubin Total: 0.5 mg/dL (ref 0.0–1.2)
CO2: 24 mmol/L (ref 18–29)
Calcium: 9.3 mg/dL (ref 8.7–10.2)
Chloride: 103 mmol/L (ref 96–106)
Creatinine, Ser: 1.03 mg/dL (ref 0.76–1.27)
GFR calc Af Amer: 99 mL/min/{1.73_m2} (ref >59)
GFR calc non Af Amer: 85 mL/min/{1.73_m2} (ref >59)
Globulin, Total: 2.8 g/dL (ref 1.5–4.5)
Glucose: 89 mg/dL (ref 65–99)
Potassium: 4.1 mmol/L (ref 3.5–5.2)
Sodium: 141 mmol/L (ref 134–144)
Total Protein: 7.3 g/dL (ref 6.0–8.5)

## 2017-01-07 LAB — CBC
Hematocrit: 46.7 % (ref 37.5–51.0)
Hemoglobin: 16.5 g/dL (ref 13.0–17.7)
MCH: 33 pg (ref 26.6–33.0)
MCHC: 35.3 g/dL (ref 31.5–35.7)
MCV: 93 fL (ref 79–97)
Platelets: 166 10*3/uL (ref 150–379)
RBC: 5 x10E6/uL (ref 4.14–5.80)
RDW: 15.1 % (ref 12.3–15.4)
WBC: 9.5 10*3/uL (ref 3.4–10.8)

## 2017-01-07 LAB — HIV ANTIBODY (ROUTINE TESTING W REFLEX): HIV Screen 4th Generation wRfx: NONREACTIVE

## 2017-01-07 LAB — ANGIOTENSIN CONVERTING ENZYME: Angio Convert Enzyme: 32 U/L (ref 14–82)

## 2017-01-07 LAB — CRYOGLOBULIN

## 2017-01-07 LAB — METHYLMALONIC ACID, SERUM: Methylmalonic Acid: 142 nmol/L (ref 0–378)

## 2017-01-07 LAB — B. BURGDORFI ANTIBODIES: Lyme IgG/IgM Ab: <0.91 {ISR} (ref 0.00–0.90)

## 2017-01-07 LAB — SJOGREN'S SYNDROME ANTIBODS(SSA + SSB)
ENA SSA (RO) Ab: <0.2 AI (ref 0.0–0.9)
ENA SSB (LA) Ab: <0.2 AI (ref 0.0–0.9)

## 2017-01-07 LAB — B12 AND FOLATE PANEL
Folate: 17 ng/mL (ref >3.0)
Vitamin B-12: 719 pg/mL (ref 232–1245)

## 2017-01-07 LAB — VITAMIN E: Vitamin E (Alpha Tocopherol): 11.8 mg/L (ref 5.3–17.5)

## 2017-02-18 ENCOUNTER — Encounter (INDEPENDENT_AMBULATORY_CARE_PROVIDER_SITE_OTHER): Payer: Self-pay

## 2017-02-18 ENCOUNTER — Ambulatory Visit (INDEPENDENT_AMBULATORY_CARE_PROVIDER_SITE_OTHER): Payer: Self-pay | Admitting: Neurology

## 2017-02-18 ENCOUNTER — Ambulatory Visit (INDEPENDENT_AMBULATORY_CARE_PROVIDER_SITE_OTHER): Payer: Managed Care, Other (non HMO) | Admitting: Neurology

## 2017-02-18 DIAGNOSIS — M79642 Pain in left hand: Secondary | ICD-10-CM

## 2017-02-18 DIAGNOSIS — R202 Paresthesia of skin: Secondary | ICD-10-CM

## 2017-02-18 DIAGNOSIS — Z0289 Encounter for other administrative examinations: Secondary | ICD-10-CM

## 2017-02-18 DIAGNOSIS — G609 Hereditary and idiopathic neuropathy, unspecified: Secondary | ICD-10-CM

## 2017-02-18 DIAGNOSIS — M79641 Pain in right hand: Secondary | ICD-10-CM

## 2017-02-18 NOTE — Progress Notes (Signed)
Full Name: Eduardo [name] Gender: Male MRN #: [id number] Date of Birth: 1971-09-11    Visit Date: 02/18/2017 10:25 Age: 46 Years 1 Months Old Examining Physician: Sarina Ill, MD     History: EMG/NCS performed to evaluate for CTS vs Radiculopathy vs Polyneuropathy in the upper extremities.  Summary: EMG/NCS performed in the bilateral upper extremities  All muscles and nerves (as detailed in the following tables) were normal  Conclusion: This is a normal exam  Sarina Ill, M.D.  [facility] [place], [place] [address] Tel: [phone] Fax: [phone]        [facility]    Nerve / Sites Rec. Site Latency Ref. Amplitude Ref. Rel Amp Segments Distance Velocity Ref. Area    ms ms mV mV %  cm m/s m/s mVms  R Median - APB     Wrist APB 4.0 ?4.4 8.0 ?4.0 100 Wrist - APB 7   37.5     Upper arm APB 8.6  8.1  101 Upper arm - Wrist 26 55 ?49 38.7  L Median - APB     Wrist APB 4.2 ?4.4 5.9 ?4.0 100 Wrist - APB 7   23.4     Upper arm APB 9.0  5.7  96.5 Upper arm - Wrist 26 55 ?49 23.0  R Ulnar - ADM     Wrist ADM 3.1 ?3.3 12.3 ?6.0 100 Wrist - ADM 7   35.5     B.Elbow ADM 7.1  10.0  81.8 B.Elbow - Wrist 21 52 ?49 30.4     A.Elbow ADM 8.9  9.8  97.5 A.Elbow - B.Elbow 10 56 ?49 29.4         A.Elbow - Wrist      L Ulnar - ADM     Wrist ADM 3.1 ?3.3 8.0 ?6.0 100 Wrist - ADM 7   26.8     B.Elbow ADM 7.5  7.4  92.8 B.Elbow - Wrist 23 53 ?49 26.6     A.Elbow ADM 9.3  7.1  96.4 A.Elbow - B.Elbow 10 55 ?49 25.6         A.Elbow - Wrist                 SNC    Nerve / Sites Rec. Site Peak Lat Ref.  Amp Ref. Segments Distance    ms ms V V  cm  R Median - Orthodromic (Dig II, Mid palm)     Dig II Wrist 3.02 ?3.40 19 ?10 Dig II - Wrist 13  L Median - Orthodromic (Dig II, Mid palm)     Dig II Wrist 3.33 ?3.40 15 ?10 Dig II - Wrist 13  R Ulnar - Orthodromic, (Dig V, Mid palm)     Dig V Wrist 2.76 ?3.10 5 ?5 Dig V - Wrist 11  L Ulnar - Orthodromic, (Dig V,  Mid palm)     Dig V Wrist 2.86 ?3.10 7 ?5 Dig V - Wrist 83             F  Wave    Nerve F Lat Ref.   ms ms  R Ulnar - ADM 33.9 ?32.0  L Ulnar - ADM 35.0 ?32.0         EMG full       EMG Summary Table    Spontaneous MUAP Recruitment  Muscle IA Fib PSW Fasc Other Amp Dur. Poly Pattern  L. Deltoid Normal None None None _______ Normal  Normal Normal Normal  L. Trapezius Normal None None None _______ Normal Normal Normal Normal  L. Pronator teres Normal None None None _______ Normal Normal Normal Normal  L. First dorsal interosseous Normal None None None _______ Normal Normal Normal Normal  L. Cervical paraspinals (low) Normal None None None _______ Normal Normal Normal Normal  L. Opponens pollicis Normal None None None _______ Normal Normal Normal Normal

## 2017-02-20 NOTE — Procedures (Signed)
Full Name: Eduardo [name] Gender: Male MRN #: [id number] Date of Birth: 12-25-1970    Visit Date: 02/18/2017 10:25 Age: 46 Years 1 Months Old Examining Physician: Sarina Ill, MD     History: EMG/NCS performed to evaluate for CTS vs Radiculopathy vs Polyneuropathy in the upper extremities.  Summary: EMG/NCS performed in the bilateral upper extremities  All muscles and nerves (as detailed in the following tables) were normal  Conclusion: This is a normal exam  Sarina Ill, M.D.  [facility] [place], [place] [address] Tel: [phone] Fax: [phone]        [facility]    Nerve / Sites Rec. Site Latency Ref. Amplitude Ref. Rel Amp Segments Distance Velocity Ref. Area    ms ms mV mV %  cm m/s m/s mVms  R Median - APB     Wrist APB 4.0 ?4.4 8.0 ?4.0 100 Wrist - APB 7   37.5     Upper arm APB 8.6  8.1  101 Upper arm - Wrist 26 55 ?49 38.7  L Median - APB     Wrist APB 4.2 ?4.4 5.9 ?4.0 100 Wrist - APB 7   23.4     Upper arm APB 9.0  5.7  96.5 Upper arm - Wrist 26 55 ?49 23.0  R Ulnar - ADM     Wrist ADM 3.1 ?3.3 12.3 ?6.0 100 Wrist - ADM 7   35.5     B.Elbow ADM 7.1  10.0  81.8 B.Elbow - Wrist 21 52 ?49 30.4     A.Elbow ADM 8.9  9.8  97.5 A.Elbow - B.Elbow 10 56 ?49 29.4         A.Elbow - Wrist      L Ulnar - ADM     Wrist ADM 3.1 ?3.3 8.0 ?6.0 100 Wrist - ADM 7   26.8     B.Elbow ADM 7.5  7.4  92.8 B.Elbow - Wrist 23 53 ?49 26.6     A.Elbow ADM 9.3  7.1  96.4 A.Elbow - B.Elbow 10 55 ?49 25.6         A.Elbow - Wrist                 SNC    Nerve / Sites Rec. Site Peak Lat Ref.  Amp Ref. Segments Distance    ms ms V V  cm  R Median - Orthodromic (Dig II, Mid palm)     Dig II Wrist 3.02 ?3.40 19 ?10 Dig II - Wrist 13  L Median - Orthodromic (Dig II, Mid palm)     Dig II Wrist 3.33 ?3.40 15 ?10 Dig II - Wrist 13  R Ulnar - Orthodromic, (Dig V, Mid palm)     Dig V Wrist 2.76 ?3.10 5 ?5 Dig V - Wrist 11  L Ulnar - Orthodromic, (Dig V,  Mid palm)     Dig V Wrist 2.86 ?3.10 7 ?5 Dig V - Wrist 20             F  Wave    Nerve F Lat Ref.   ms ms  R Ulnar - ADM 33.9 ?32.0  L Ulnar - ADM 35.0 ?32.0         EMG full       EMG Summary Table    Spontaneous MUAP Recruitment  Muscle IA Fib PSW Fasc Other Amp Dur. Poly Pattern  L. Deltoid Normal None None None _______ Normal  Normal Normal Normal  L. Trapezius Normal None None None _______ Normal Normal Normal Normal  L. Pronator teres Normal None None None _______ Normal Normal Normal Normal  L. First dorsal interosseous Normal None None None _______ Normal Normal Normal Normal  L. Cervical paraspinals (low) Normal None None None _______ Normal Normal Normal Normal  L. Opponens pollicis Normal None None None _______ Normal Normal Normal Normal

## 2017-02-20 NOTE — Progress Notes (Signed)
See procedure note.

## 2017-03-03 ENCOUNTER — Encounter: Payer: Self-pay | Admitting: Adult Health

## 2017-03-03 ENCOUNTER — Ambulatory Visit (INDEPENDENT_AMBULATORY_CARE_PROVIDER_SITE_OTHER): Payer: Managed Care, Other (non HMO) | Admitting: Adult Health

## 2017-03-03 DIAGNOSIS — G629 Polyneuropathy, unspecified: Secondary | ICD-10-CM | POA: Diagnosis not present

## 2017-03-03 DIAGNOSIS — F17200 Nicotine dependence, unspecified, uncomplicated: Secondary | ICD-10-CM | POA: Diagnosis not present

## 2017-03-03 DIAGNOSIS — I10 Essential (primary) hypertension: Secondary | ICD-10-CM | POA: Diagnosis not present

## 2017-03-03 DIAGNOSIS — Z0000 Encounter for general adult medical examination without abnormal findings: Secondary | ICD-10-CM | POA: Diagnosis not present

## 2017-03-03 MED ORDER — LISINOPRIL 5 MG PO TABS: 10.0000 mg | ORAL_TABLET | Freq: Every day | ORAL | 0 refills | Status: DC

## 2017-03-03 NOTE — Assessment & Plan Note (Signed)
Started on Lisinopril 39m, keep BP log and bring with at next OV. Tobacco cessation. Increase regular movement. Reduce soda pop intake and increase water. Heart Healthy Diet. CPE to discuss myriad of health concerns.

## 2017-03-03 NOTE — Progress Notes (Signed)
Subjective:    Patient ID: Eduardo [name], male    DOB: [birth year], 46 y.o.   MRN: [id number]  HPI:  Eduardo [name] presents to establish as a new pt.  He is a pleasant 46 year old male. PMH:  HNT, anxiety, depression, tobacco abuse, DDD, obesity, chronic back pain, and hx of opiate addiction.   Quick hx recap: 2009 WC back injury, led to DDD, and chronic pain. [name] treated back pain-PT and narcotics. He eventually developed tolerance and dependence.  Reports at height of use he was taking Oxy 23m x 5 doses a day. 2015 he completed week of in patient rehab and has been off narcotics since then. He has been on/off Adderall 23mBID for years-not currently. CBT on/off for years-not currently. Neuropathy of lower extremities, of unknown origin.  Followed by Eduardo [name]/Neurology-please see EPIC notes. HTN-been treated with unknown medication, last use was 2015. Chronic depression/anxiety.  Depression in acute exacerbation due to father's sudden death r/t head injury at home-witnessed by pt and his step-mother. Declines CBT at this time, due to cost. He wants to loss weight to improve health and his overall appearance, goal wt. He reports "binge eating fast food" and very little exercise due to neuropathy. He is not using ETOH or narcotics to self medicate and reports a strong support system of step-mother and siblings.  Patient Care Team    Relationship Specialty Notifications Start End  Eduardo AquasNP PCP - General Family Medicine  03/03/17     Patient Active Problem List   Diagnosis Date Noted  . Health care maintenance 03/03/2017  . Hydradenitis 01/17/2016  . Neuropathy 09/28/2015  . Major depression 12/26/2012  . Generalized anxiety disorder 12/26/2012  . Opiate dependence ([facility]05[place]  . Ben[place]od[place]ine [facility] ([facility]  . HTN (hypertension) 12/24/2012  . 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 OBESITY 09/09/2010  . TOBACCO ABUSE 09/09/2010  .  LOW BACK PAIN, CHRONIC 09/09/2010     Past Medical History:  Diagnosis Date  . Anxiety   . Chronic back pain   . Degenerative disk disease   . Depression   . Hypertension   . MRSA (methicillin resistant staph aureus) culture positive   . Peripheral neuropathy   . Pilonidal cyst      Past Surgical History:  Procedure Laterality Date  . THORACIC OUTLET SURGERY  2000  . WRIST FUSION  2002     Family History  Problem Relation Age of Onset  . Cancer Maternal Grandmother        breast, ovarian & colon  . Hypertension Mother   . Diabetes Mother   . Hypertension Father   . Heart disease Father   . Stroke Paternal Grandfather   . Neuropathy Neg Hx      History  Drug Use No     History  Alcohol Use No    Comment: Rarely     History  Smoking Status  . Current Every Day Smoker  . Packs/day: 1.00  . Years: 15.00  . Types: Cigarettes  Smokeless Tobacco  . Never Used     Outpatient Encounter Prescriptions as of 03/03/2017  Medication Sig  . Acetaminophen (TYLENOL ARTHRITIS PAIN PO) Take 4 tablets by mouth daily. [id number]  . diphenhydramine-acetaminophen (TYLENOL PM) 25-500 MG TABS tablet Take 2 tablets by mouth at bedtime as needed.  . Multiple Vitamins-Minerals (MULTIVITAMIN WITH MINERALS) tablet Take 1 tablet by mouth daily.  . pregabalin (LYRICA) 300 MG capsule  Take 1 capsule (300 mg total) by mouth 2 (two) times daily.  [name] lisinopril (PRINIVIL,ZESTRIL) 5 MG tablet Take 2 tablets (10 mg total) by mouth daily.   No facility-administered encounter medications on file as of 03/03/2017.     Allergies: Patient has no known allergies.  Body mass index is 38.59 kg/m.  Blood pressure (!) 151/98, pulse 81, temperature 98.3 F (36.8 C), height 6' 4"  (1.93 m), weight (!) 317 lb (143.8 kg), SpO2 96 %.     Review of Systems  Constitutional: Positive for activity change and fatigue. Negative for appetite change, chills, diaphoresis, fever and unexpected  weight change.  HENT: Negative for congestion.   Eyes: Positive for visual disturbance.  Respiratory: Negative for cough, chest tightness, shortness of breath, wheezing and stridor.   Cardiovascular: Negative for chest pain, palpitations and leg swelling.  Gastrointestinal: Positive for blood in stool. Negative for abdominal distention, abdominal pain, constipation, diarrhea, nausea and vomiting.  Endocrine: Negative for cold intolerance, heat intolerance, polydipsia, polyphagia and polyuria.  Genitourinary: Negative for difficulty urinating, flank pain and hematuria.  Musculoskeletal: Positive for arthralgias, back pain, gait problem, joint swelling and myalgias. Negative for neck pain and neck stiffness.  Skin: Negative for color change, pallor, rash and wound.  Allergic/Immunologic: Negative for immunocompromised state.  Neurological: Negative for dizziness, tremors, speech difficulty, weakness, light-headedness, numbness and headaches.  Hematological: Does not bruise/bleed easily.  Psychiatric/Behavioral: Positive for dysphoric mood and sleep disturbance. Negative for agitation, self-injury and suicidal ideas. The patient is nervous/anxious.        Objective:   Physical Exam  Constitutional: He is oriented to person, place, and time. He appears well-developed and well-nourished. No distress.  HENT:  Head: Normocephalic and atraumatic.  Right Ear: Hearing, tympanic membrane, external ear and ear canal normal. No decreased hearing is noted.  Left Ear: Hearing, tympanic membrane, external ear and ear canal normal. No decreased hearing is noted.  Nose: Nose normal. Right sinus exhibits no maxillary sinus tenderness and no frontal sinus tenderness. Left sinus exhibits no maxillary sinus tenderness and no frontal sinus tenderness.  Mouth/Throat: Uvula is midline.  Eyes: Conjunctivae are normal. Pupils are equal, round, and reactive to light.  Neck: Neck supple.  Cardiovascular: Normal rate,  regular rhythm, normal heart sounds and intact distal pulses.   No murmur heard. Pulmonary/Chest: Effort normal and breath sounds normal. No respiratory distress. He has no wheezes. He has no rales. He exhibits no tenderness.  Abdominal: Soft. Bowel sounds are normal. He exhibits no distension and no mass. There is no tenderness. There is no rebound and no guarding.  Musculoskeletal: He exhibits tenderness.  Lymphadenopathy:    He has no cervical adenopathy.  Neurological: He is alert and oriented to person, place, and time. Coordination normal.  Skin: Skin is warm and dry. No rash noted. No erythema. No pallor.  Psychiatric: He has a normal mood and affect. His behavior is normal. Judgment and thought content normal.  Nursing note and vitals reviewed.         Assessment & Plan:   1. Essential hypertension   2. Neuropathy   3. TOBACCO ABUSE   4. Health care maintenance     HTN (hypertension) BP well above goal 140/90 Long standing HTN hx, previously on meds however cannot recall name. Started on Lisinopril 79m daily. Reduce Na+ Please record BP daily and bring log at next appt in 4 weeks.   Neuropathy ([facility] Continue with Neurologist and using Lyrica 3065mBID.  TOBACCO ABUSE Pack/day. Declined smoking cessation at today's OV.  Health care maintenance Started on Lisinopril 105m, keep BP log and bring with at next OV. Tobacco cessation. Increase regular movement. Reduce soda pop intake and increase water. Heart Healthy Diet. CPE to discuss myriad of health concerns.    FOLLOW-UP:  Return in about 4 weeks (around 03/31/2017) for Regular Follow Up, HTN.

## 2017-03-03 NOTE — Assessment & Plan Note (Signed)
>>  ASSESSMENT AND PLAN FOR NEUROPATHY WRITTEN ON 03/03/2017  3:08 PM BY BESS, KATY D, NP  Continue with Neurologist and using Lyrica 300mg  BID.

## 2017-03-03 NOTE — Assessment & Plan Note (Signed)
Pack/day. Declined smoking cessation at today's OV.

## 2017-03-03 NOTE — Assessment & Plan Note (Signed)
Continue with Neurologist and using Lyrica 300mg  BID.

## 2017-03-03 NOTE — Patient Instructions (Signed)
Hypertension Hypertension, commonly called high blood pressure, is when the force of blood pumping through the arteries is too strong. The arteries are the blood vessels that carry blood from the heart throughout the body. Hypertension forces the heart to work harder to pump blood and may cause arteries to become narrow or stiff. Having untreated or uncontrolled hypertension can cause heart attacks, strokes, kidney disease, and other problems. A blood pressure reading consists of a higher number over a lower number. Ideally, your blood pressure should be below 120/80. The first ("top") number is called the systolic pressure. It is a measure of the pressure in your arteries as your heart beats. The second ("bottom") number is called the diastolic pressure. It is a measure of the pressure in your arteries as the heart relaxes. What are the causes? The cause of this condition is not known. What increases the risk? Some risk factors for high blood pressure are under your control. Others are not. Factors you can change   Smoking.  Having type 2 diabetes mellitus, high cholesterol, or both.  Not getting enough exercise or physical activity.  Being overweight.  Having too much fat, sugar, calories, or salt (sodium) in your diet.  Drinking too much alcohol. Factors that are difficult or impossible to change   Having chronic kidney disease.  Having a family history of high blood pressure.  Age. Risk increases with age.  Race. You may be at higher risk if you are African-American.  Gender. Men are at higher risk than women before age 45. After age 65, women are at higher risk than men.  Having obstructive sleep apnea.  Stress. What are the signs or symptoms? Extremely high blood pressure (hypertensive crisis) may cause:  Headache.  Anxiety.  Shortness of breath.  Nosebleed.  Nausea and vomiting.  Severe chest pain.  Jerky movements you cannot control (seizures). How is this  diagnosed? This condition is diagnosed by measuring your blood pressure while you are seated, with your arm resting on a surface. The cuff of the blood pressure monitor will be placed directly against the skin of your upper arm at the level of your heart. It should be measured at least twice using the same arm. Certain conditions can cause a difference in blood pressure between your right and left arms. Certain factors can cause blood pressure readings to be lower or higher than normal (elevated) for a short period of time:  When your blood pressure is higher when you are in a health care provider's office than when you are at home, this is called white coat hypertension. Most people with this condition do not need medicines.  When your blood pressure is higher at home than when you are in a health care provider's office, this is called masked hypertension. Most people with this condition may need medicines to control blood pressure. If you have a high blood pressure reading during one visit or you have normal blood pressure with other risk factors:  You may be asked to return on a different day to have your blood pressure checked again.  You may be asked to monitor your blood pressure at home for 1 week or longer. If you are diagnosed with hypertension, you may have other blood or imaging tests to help your health care provider understand your overall risk for other conditions. How is this treated? This condition is treated by making healthy lifestyle changes, such as eating healthy foods, exercising more, and reducing your alcohol intake. Your health   care provider may prescribe medicine if lifestyle changes are not enough to get your blood pressure under control, and if:  Your systolic blood pressure is above 130.  Your diastolic blood pressure is above 80. Your personal target blood pressure may vary depending on your medical conditions, your age, and other factors. Follow these instructions  at home: Eating and drinking   Eat a diet that is high in fiber and potassium, and low in sodium, added sugar, and fat. An example eating plan is called the DASH (Dietary Approaches to Stop Hypertension) diet. To eat this way:  Eat plenty of fresh fruits and vegetables. Try to fill half of your plate at each meal with fruits and vegetables.  Eat whole grains, such as whole wheat pasta, brown rice, or whole grain bread. Fill about one quarter of your plate with whole grains.  Eat or drink low-fat dairy products, such as skim milk or low-fat yogurt.  Avoid fatty cuts of meat, processed or cured meats, and poultry with skin. Fill about one quarter of your plate with lean proteins, such as fish, chicken without skin, beans, eggs, and tofu.  Avoid premade and processed foods. These tend to be higher in sodium, added sugar, and fat.  Reduce your daily sodium intake. Most people with hypertension should eat less than 1,500 mg of sodium a day.  Limit alcohol intake to no more than 1 drink a day for nonpregnant women and 2 drinks a day for men. One drink equals 12 oz of beer, 5 oz of wine, or 1 oz of hard liquor. Lifestyle   Work with your health care provider to maintain a healthy body weight or to lose weight. Ask what an ideal weight is for you.  Get at least 30 minutes of exercise that causes your heart to beat faster (aerobic exercise) most days of the week. Activities may include walking, swimming, or biking.  Include exercise to strengthen your muscles (resistance exercise), such as pilates or lifting weights, as part of your weekly exercise routine. Try to do these types of exercises for 30 minutes at least 3 days a week.  Do not use any products that contain nicotine or tobacco, such as cigarettes and e-cigarettes. If you need help quitting, ask your health care provider.  Monitor your blood pressure at home as told by your health care provider.  Keep all follow-up visits as told by  your health care provider. This is important. Medicines   Take over-the-counter and prescription medicines only as told by your health care provider. Follow directions carefully. Blood pressure medicines must be taken as prescribed.  Do not skip doses of blood pressure medicine. Doing this puts you at risk for problems and can make the medicine less effective.  Ask your health care provider about side effects or reactions to medicines that you should watch for. Contact a health care provider if:  You think you are having a reaction to a medicine you are taking.  You have headaches that keep coming back (recurring).  You feel dizzy.  You have swelling in your ankles.  You have trouble with your vision. Get help right away if:  You develop a severe headache or confusion.  You have unusual weakness or numbness.  You feel faint.  You have severe pain in your chest or abdomen.  You vomit repeatedly.  You have trouble breathing. Summary  Hypertension is when the force of blood pumping through your arteries is too strong. If this condition is   not controlled, it may put you at risk for serious complications.  Your personal target blood pressure may vary depending on your medical conditions, your age, and other factors. For most people, a normal blood pressure is less than 120/80.  Hypertension is treated with lifestyle changes, medicines, or a combination of both. Lifestyle changes include weight loss, eating a healthy, low-sodium diet, exercising more, and limiting alcohol. This information is not intended to replace advice given to you by your health care provider. Make sure you discuss any questions you have with your health care provider. Document Released: 10/05/2005 Document Revised: 09/02/2016 Document Reviewed: 09/02/2016 Elsevier Interactive Patient Education  2017 [place].   Heart-Healthy Eating Plan Many factors influence your heart health, including eating and  exercise habits. Heart (coronary) risk increases with abnormal blood fat (lipid) levels. Heart-healthy meal planning includes limiting unhealthy fats, increasing healthy fats, and making other small dietary changes. This includes maintaining a healthy body weight to help keep lipid levels within a normal range. What is my plan? Your health care provider recommends that you:  Get no more than _________% of the total calories in your daily diet from fat.  Limit your intake of saturated fat to less than _________% of your total calories each day.  Limit the amount of cholesterol in your diet to less than _________ mg per day. What types of fat should I choose?  Choose healthy fats more often. Choose monounsaturated and polyunsaturated fats, such as olive oil and canola oil, flaxseeds, walnuts, almonds, and seeds.  Eat more omega-3 fats. Good choices include salmon, mackerel, sardines, tuna, flaxseed oil, and ground flaxseeds. Aim to eat fish at least two times each week.  Limit saturated fats. Saturated fats are primarily found in animal products, such as meats, butter, and cream. Plant sources of saturated fats include palm oil, palm kernel oil, and coconut oil.  Avoid foods with partially hydrogenated oils in them. These contain trans fats. Examples of foods that contain trans fats are stick margarine, some tub margarines, cookies, crackers, and other baked goods. What general guidelines do I need to follow?  Check food labels carefully to identify foods with trans fats or high amounts of saturated fat.  Fill one half of your plate with vegetables and green salads. Eat 4-5 servings of vegetables per day. A serving of vegetables equals 1 cup of raw leafy vegetables,  cup of raw or cooked cut-up vegetables, or  cup of vegetable juice.  Fill one fourth of your plate with whole grains. Look for the word "whole" as the first word in the ingredient list.  Fill one fourth of your plate with lean  protein foods.  Eat 4-5 servings of fruit per day. A serving of fruit equals one medium whole fruit,  cup of dried fruit,  cup of fresh, frozen, or canned fruit, or  cup of 100% fruit juice.  Eat more foods that contain soluble fiber. Examples of foods that contain this type of fiber are apples, broccoli, carrots, beans, peas, and barley. Aim to get 20-30 g of fiber per day.  Eat more home-cooked food and less restaurant, buffet, and fast food.  Limit or avoid alcohol.  Limit foods that are high in starch and sugar.  Avoid fried foods.  Cook foods by using methods other than frying. Baking, boiling, grilling, and broiling are all great options. Other fat-reducing suggestions include:  Removing the skin from poultry.  Removing all visible fats from meats.  Skimming the fat off  of stews, soups, and gravies before serving them.  Steaming vegetables in water or broth.  Lose weight if you are overweight. Losing just 5-10% of your initial body weight can help your overall health and prevent diseases such as diabetes and heart disease.  Increase your consumption of nuts, legumes, and seeds to 4-5 servings per week. One serving of dried beans or legumes equals  cup after being cooked, one serving of nuts equals 1 ounces, and one serving of seeds equals  ounce or 1 tablespoon.  You may need to monitor your salt (sodium) intake, especially if you have high blood pressure. Talk with your health care provider or dietitian to get more information about reducing sodium. What foods can I eat? Grains   Breads, including [place], white, pita, wheat, raisin, rye, oatmeal, and [place]. Tortillas that are neither fried nor made with lard or trans fat. Low-fat rolls, including hotdog and hamburger buns and English muffins. Biscuits. Muffins. Waffles. Pancakes. Light popcorn. Whole-grain cereals. Flatbread. Melba toast. Pretzels. Breadsticks. Rusks. Low-fat snacks and crackers, including oyster,  saltine, matzo, graham, animal, and rye. Rice and pasta, including brown rice and those that are made with whole wheat. Vegetables  All vegetables. Fruits  All fruits, but limit coconut. Meats and Other Protein Sources  Lean, well-trimmed beef, veal, pork, and lamb. Chicken and [place] without skin. All fish and shellfish. Wild duck, rabbit, pheasant, and venison. Egg whites or low-cholesterol egg substitutes. Dried beans, peas, lentils, and tofu.Seeds and most nuts. Dairy  Low-fat or nonfat cheeses, including ricotta, string, and mozzarella. Skim or 1% milk that is liquid, powdered, or evaporated. Buttermilk that is made with low-fat milk. Nonfat or low-fat yogurt. Beverages  Mineral water. Diet carbonated beverages. Sweets and Desserts  Sherbets and fruit ices. Honey, jam, marmalade, jelly, and syrups. Meringues and gelatins. Pure sugar candy, such as hard candy, jelly beans, gumdrops, mints, marshmallows, and small amounts of dark chocolate. [employer] Eat all sweets and desserts in moderation. Fats and Oils  Nonhydrogenated (trans-free) margarines. Vegetable oils, including soybean, sesame, sunflower, olive, peanut, safflower, corn, canola, and cottonseed. Salad dressings or mayonnaise that are made with a vegetable oil. Limit added fats and oils that you use for cooking, baking, salads, and as spreads. Other  Cocoa powder. Coffee and tea. All seasonings and condiments. The items listed above may not be a complete list of recommended foods or beverages. Contact your dietitian for more options.  What foods are not recommended? Grains  Breads that are made with saturated or trans fats, oils, or whole milk. Croissants. Butter rolls. Cheese breads. Sweet rolls. Donuts. Buttered popcorn. Chow mein noodles. High-fat crackers, such as cheese or butter crackers. Meats and Other Protein Sources  Fatty meats, such as hotdogs, short ribs, sausage, spareribs, bacon, ribeye roast or steak, and  mutton. High-fat deli meats, such as salami and bologna. Caviar. Domestic duck and goose. Organ meats, such as kidney, liver, sweetbreads, brains, gizzard, chitterlings, and heart. Dairy  Cream, sour cream, cream cheese, and creamed cottage cheese. Whole milk cheeses, including blue (bleu), Monte[place] Jack, [place], [place], American, [place], Swiss, [place], [place], and [place]. Whole or 2% milk that is liquid, evaporated, or condensed. Whole buttermilk. Cream sauce or high-fat cheese sauce. Yogurt that is made from whole milk. Beverages  Regular sodas and drinks with added sugar. Sweets and Desserts  Frosting. Pudding. Cookies. Cakes other than angel food cake. Candy that has milk chocolate or white chocolate, hydrogenated fat, butter, coconut, or unknown ingredients. Buttered  syrups. Full-fat ice cream or ice cream drinks. Fats and Oils  Gravy that has suet, meat fat, or shortening. Cocoa butter, hydrogenated oils, palm oil, coconut oil, palm kernel oil. These can often be found in baked products, candy, fried foods, nondairy creamers, and whipped toppings. Solid fats and shortenings, including bacon fat, salt pork, lard, and butter. Nondairy cream substitutes, such as coffee creamers and sour cream substitutes. Salad dressings that are made of unknown oils, cheese, or sour cream. The items listed above may not be a complete list of foods and beverages to avoid. Contact your dietitian for more information.  This information is not intended to replace advice given to you by your health care provider. Make sure you discuss any questions you have with your health care provider. Document Released: 07/14/2008 Document Revised: 04/24/2016 Document Reviewed: 03/29/2014 Elsevier Interactive Patient Education  2017 [place].  Please record BP daily and bring log at your next appt in 4 weeks. Please schedule complete physical at your convenience. Please call with any questions/concerns. NICE TO YOU MEET  YOU!

## 2017-03-03 NOTE — Assessment & Plan Note (Addendum)
BP well above goal 140/90 Long standing HTN hx, previously on meds however cannot recall name. Started on Lisinopril 5mg  daily. Reduce Na+ Please record BP daily and bring log at next appt in 4 weeks.

## 2017-04-01 ENCOUNTER — Encounter: Payer: Self-pay | Admitting: Adult Health

## 2017-04-01 ENCOUNTER — Ambulatory Visit (INDEPENDENT_AMBULATORY_CARE_PROVIDER_SITE_OTHER): Payer: Managed Care, Other (non HMO) | Admitting: Adult Health

## 2017-04-01 DIAGNOSIS — I10 Essential (primary) hypertension: Secondary | ICD-10-CM

## 2017-04-01 MED ORDER — AMLODIPINE BESYLATE 2.5 MG PO TABS: 2.5000 mg | ORAL_TABLET | Freq: Every day | ORAL | 1 refills | Status: DC

## 2017-04-01 NOTE — Assessment & Plan Note (Signed)
Developed ACE cough on Lisinopril 5mg , therefore d/c'd med. Started on Amlodipine 2.5mg  daily. Follow Heart Healthy Diet and continue to increase regular movement. Please check your BP daily and record in log-bring with you at your next appt. 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

## 2017-04-01 NOTE — Progress Notes (Signed)
Subjective:    Patient ID: Eduardo [name], male    DOB: [birth year], 46 y.o.   MRN: [id number]  HPI:  Mr. Wixom is here for f/u on HTN and recently started on Lisinopril 5mg .  He has been taking medication daily and BP has responded nicely, today at goal 123/77.  However he has developed a persistent, non-productive cough.  He denies CP/palpitations and reports home BP recordings: SBP 120-130s, BDP 70-90s.  Patient Care Team    Relationship Specialty Notifications Start End  [name], NP PCP - General Family Medicine  03/03/17     Patient Active Problem List   Diagnosis Date Noted  . Health care maintenance 03/03/2017  . Hydradenitis 01/17/2016  . Neuropathy 09/28/2015  . Major depression 12/26/2012  . Generalized anxiety disorder 12/26/2012  . Opiate dependence ([place]) 12/24/2012  . Benzodiazepine dependence ([place]) 12/24/2012  . HTN (hypertension) 12/24/2012  . 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 OBESITY 09/09/2010  . TOBACCO ABUSE 09/09/2010  . LOW BACK PAIN, CHRONIC 09/09/2010     Past Medical History:  Diagnosis Date  . Anxiety   . Chronic back pain   . Degenerative disk disease   . Depression   . Hypertension   . MRSA (methicillin resistant staph aureus) culture positive   . Peripheral neuropathy   . Pilonidal cyst      Past Surgical History:  Procedure Laterality Date  . THORACIC OUTLET SURGERY  2000  . WRIST FUSION  2002     Family History  Problem Relation Age of Onset  . Cancer Maternal Grandmother        breast, ovarian & colon  . Hypertension Mother   . Diabetes Mother   . Hypertension Father   . Heart disease Father   . Stroke Paternal Grandfather   . Neuropathy Neg Hx      History  Drug Use No     History  Alcohol Use No    Comment: Rarely     History  Smoking Status  . Current Every Day Smoker  . Packs/day: 1.00  . Years: 15.00  . Types: Cigarettes  Smokeless Tobacco  . Never Used     Outpatient Encounter  Prescriptions as of 04/01/2017  Medication Sig  . Acetaminophen (TYLENOL ARTHRITIS PAIN PO) Take 4 tablets by mouth daily. 650mg  tablet  . diphenhydramine-acetaminophen (TYLENOL PM) 25-500 MG TABS tablet Take 2 tablets by mouth at bedtime as needed.  . Multiple Vitamins-Minerals (MULTIVITAMIN WITH MINERALS) tablet Take 1 tablet by mouth daily.  . pregabalin (LYRICA) 300 MG capsule Take 1 capsule (300 mg total) by mouth 2 (two) times daily.  . [DISCONTINUED] lisinopril (PRINIVIL,ZESTRIL) 5 MG tablet Take 2 tablets (10 mg total) by mouth daily.  [name] amLODipine (NORVASC) 2.5 MG tablet Take 1 tablet (2.5 mg total) by mouth daily.   No facility-administered encounter medications on file as of 04/01/2017.     Allergies: Patient has no known allergies.  Body mass index is 38.87 kg/m.  Blood pressure 123/77, pulse 89, height 6\' 4"  (1.93 m), weight (!) 319 lb 4.8 oz (144.8 kg).     Review of Systems  Constitutional: Negative for activity change, appetite change, chills, diaphoresis, fatigue, fever and unexpected weight change.  Respiratory: Positive for cough. Negative for chest tightness, shortness of breath, wheezing and stridor.   Cardiovascular: Negative for chest pain, palpitations and leg swelling.  Neurological: Negative for dizziness, light-headedness and headaches.       Objective:   Physical  Exam  Constitutional: He appears well-developed and well-nourished. No distress.  HENT:  Head: Normocephalic and atraumatic.  Eyes: Conjunctivae are normal. Pupils are equal, round, and reactive to light.  Cardiovascular: Normal rate, regular rhythm, normal heart sounds and intact distal pulses.   No murmur heard. Pulmonary/Chest: Effort normal and breath sounds normal. No respiratory distress. He has no wheezes. He has no rales. He exhibits no tenderness.  Skin: Skin is warm and dry. No rash noted. He is not diaphoretic. No erythema. No pallor.  Psychiatric: He has a normal mood and affect.  His behavior is normal. Judgment and thought content normal.  Nursing note and vitals reviewed.         Assessment & Plan:   1. Essential hypertension     HTN (hypertension) Developed ACE cough on Lisinopril 5mg , therefore d/c'd med. Started on Amlodipine 2.5mg  daily. Follow Heart Healthy Diet and continue to increase regular movement. Please check your BP daily and record in log-bring with you at your next appt. 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     FOLLOW-UP:  Return in about 6 weeks (around 05/13/2017).

## 2017-04-01 NOTE — Patient Instructions (Signed)
Amlodipine tablets What is this medicine? AMLODIPINE (am LOE di peen) is a calcium-channel blocker. It affects the amount of calcium found in your heart and muscle cells. This relaxes your blood vessels, which can reduce the amount of work the heart has to do. This medicine is used to lower high blood pressure. It is also used to prevent chest pain. This medicine may be used for other purposes; ask your health care provider or pharmacist if you have questions. COMMON BRAND NAME(S): Norvasc What should I tell my health care provider before I take this medicine? They need to know if you have any of these conditions: -heart problems like heart failure or aortic stenosis -liver disease -an unusual or allergic reaction to amlodipine, other medicines, foods, dyes, or preservatives -pregnant or trying to get pregnant -breast-feeding How should I use this medicine? Take this medicine by mouth with a glass of water. Follow the directions on the prescription label. Take your medicine at regular intervals. Do not take more medicine than directed. Talk to your pediatrician regarding the use of this medicine in children. Special care may be needed. This medicine has been used in children as young as 6. Persons over 26 years old may have a stronger reaction to this medicine and need smaller doses. Overdosage: If you think you have taken too much of this medicine contact a poison control center or emergency room at once. NOTE: This medicine is only for you. Do not share this medicine with others. What if I miss a dose? If you miss a dose, take it as soon as you can. If it is almost time for your next dose, take only that dose. Do not take double or extra doses. What may interact with this medicine? -herbal or dietary supplements -local or general anesthetics -medicines for high blood pressure -medicines for prostate problems -rifampin This list may not describe all possible interactions. Give your health  care provider a list of all the medicines, herbs, non-prescription drugs, or dietary supplements you use. Also tell them if you smoke, drink alcohol, or use illegal drugs. Some items may interact with your medicine. What should I watch for while using this medicine? Visit your doctor or health care professional for regular check ups. Check your blood pressure and pulse rate regularly. Ask your health care professional what your blood pressure and pulse rate should be, and when you should contact him or her. This medicine may make you feel confused, dizzy or lightheaded. Do not drive, use machinery, or do anything that needs mental alertness until you know how this medicine affects you. To reduce the risk of dizzy or fainting spells, do not sit or stand up quickly, especially if you are an older patient. Avoid alcoholic drinks; they can make you more dizzy. Do not suddenly stop taking amlodipine. Ask your doctor or health care professional how you can gradually reduce the dose. What side effects may I notice from receiving this medicine? Side effects that you should report to your doctor or health care professional as soon as possible: -allergic reactions like skin rash, itching or hives, swelling of the face, lips, or tongue -breathing problems -changes in vision or hearing -chest pain -fast, irregular heartbeat -swelling of legs or ankles Side effects that usually do not require medical attention (report to your doctor or health care professional if they continue or are bothersome): -dry mouth -facial flushing -nausea, vomiting -stomach gas, pain -tired, weak -trouble sleeping This list may not describe all possible side  effects. Call your doctor for medical advice about side effects. You may report side effects to FDA at 1-800-FDA-1088. Where should I keep my medicine? Keep out of the reach of children. Store at room temperature between 59 and 86 degrees F (15 and 30 degrees C). Protect from  light. Keep container tightly closed. Throw away any unused medicine after the expiration date. NOTE: This sheet is a summary. It may not cover all possible information. If you have questions about this medicine, talk to your doctor, pharmacist, or health care provider.  2018 Elsevier/Gold Standard (2012-09-02 11:40:58)   Hypertension Hypertension, commonly called high blood pressure, is when the force of blood pumping through the arteries is too strong. The arteries are the blood vessels that carry blood from the heart throughout the body. Hypertension forces the heart to work harder to pump blood and may cause arteries to become narrow or stiff. Having untreated or uncontrolled hypertension can cause heart attacks, strokes, kidney disease, and other problems. A blood pressure reading consists of a higher number over a lower number. Ideally, your blood pressure should be below 120/80. The first ("top") number is called the systolic pressure. It is a measure of the pressure in your arteries as your heart beats. The second ("bottom") number is called the diastolic pressure. It is a measure of the pressure in your arteries as the heart relaxes. What are the causes? The cause of this condition is not known. What increases the risk? Some risk factors for high blood pressure are under your control. Others are not. Factors you can change  Smoking.  Having type 2 diabetes mellitus, high cholesterol, or both.  Not getting enough exercise or physical activity.  Being overweight.  Having too much fat, sugar, calories, or salt (sodium) in your diet.  Drinking too much alcohol. Factors that are difficult or impossible to change  Having chronic kidney disease.  Having a family history of high blood pressure.  Age. Risk increases with age.  Race. You may be at higher risk if you are African-American.  Gender. Men are at higher risk than women before age 73. After age 35, women are at higher  risk than men.  Having obstructive sleep apnea.  Stress. What are the signs or symptoms? Extremely high blood pressure (hypertensive crisis) may cause:  Headache.  Anxiety.  Shortness of breath.  Nosebleed.  Nausea and vomiting.  Severe chest pain.  Jerky movements you cannot control (seizures).  How is this diagnosed? This condition is diagnosed by measuring your blood pressure while you are seated, with your arm resting on a surface. The cuff of the blood pressure monitor will be placed directly against the skin of your upper arm at the level of your heart. It should be measured at least twice using the same arm. Certain conditions can cause a difference in blood pressure between your right and left arms. Certain factors can cause blood pressure readings to be lower or higher than normal (elevated) for a short period of time:  When your blood pressure is higher when you are in a health care provider's office than when you are at home, this is called white coat hypertension. Most people with this condition do not need medicines.  When your blood pressure is higher at home than when you are in a health care provider's office, this is called masked hypertension. Most people with this condition may need medicines to control blood pressure.  If you have a high blood pressure reading during  one visit or you have normal blood pressure with other risk factors:  You may be asked to return on a different day to have your blood pressure checked again.  You may be asked to monitor your blood pressure at home for 1 week or longer.  If you are diagnosed with hypertension, you may have other blood or imaging tests to help your health care provider understand your overall risk for other conditions. How is this treated? This condition is treated by making healthy lifestyle changes, such as eating healthy foods, exercising more, and reducing your alcohol intake. Your health care provider may  prescribe medicine if lifestyle changes are not enough to get your blood pressure under control, and if:  Your systolic blood pressure is above 130.  Your diastolic blood pressure is above 80.  Your personal target blood pressure may vary depending on your medical conditions, your age, and other factors. Follow these instructions at home: Eating and drinking  Eat a diet that is high in fiber and potassium, and low in sodium, added sugar, and fat. An example eating plan is called the DASH (Dietary Approaches to Stop Hypertension) diet. To eat this way: ? Eat plenty of fresh fruits and vegetables. Try to fill half of your plate at each meal with fruits and vegetables. ? Eat whole grains, such as whole wheat pasta, brown rice, or whole grain bread. Fill about one quarter of your plate with whole grains. ? Eat or drink low-fat dairy products, such as skim milk or low-fat yogurt. ? Avoid fatty cuts of meat, processed or cured meats, and poultry with skin. Fill about one quarter of your plate with lean proteins, such as fish, chicken without skin, beans, eggs, and tofu. ? Avoid premade and processed foods. These tend to be higher in sodium, added sugar, and fat.  Reduce your daily sodium intake. Most people with hypertension should eat less than 1,500 mg of sodium a day.  Limit alcohol intake to no more than 1 drink a day for nonpregnant women and 2 drinks a day for men. One drink equals 12 oz of beer, 5 oz of wine, or 1 oz of hard liquor. Lifestyle  Work with your health care provider to maintain a healthy body weight or to lose weight. Ask what an ideal weight is for you.  Get at least 30 minutes of exercise that causes your heart to beat faster (aerobic exercise) most days of the week. Activities may include walking, swimming, or biking.  Include exercise to strengthen your muscles (resistance exercise), such as pilates or lifting weights, as part of your weekly exercise routine. Try to do  these types of exercises for 30 minutes at least 3 days a week.  Do not use any products that contain nicotine or tobacco, such as cigarettes and e-cigarettes. If you need help quitting, ask your health care provider.  Monitor your blood pressure at home as told by your health care provider.  Keep all follow-up visits as told by your health care provider. This is important. Medicines  Take over-the-counter and prescription medicines only as told by your health care provider. Follow directions carefully. Blood pressure medicines must be taken as prescribed.  Do not skip doses of blood pressure medicine. Doing this puts you at risk for problems and can make the medicine less effective.  Ask your health care provider about side effects or reactions to medicines that you should watch for. Contact a health care provider if:  You think you  are having a reaction to a medicine you are taking.  You have headaches that keep coming back (recurring).  You feel dizzy.  You have swelling in your ankles.  You have trouble with your vision. Get help right away if:  You develop a severe headache or confusion.  You have unusual weakness or numbness.  You feel faint.  You have severe pain in your chest or abdomen.  You vomit repeatedly.  You have trouble breathing. Summary  Hypertension is when the force of blood pumping through your arteries is too strong. If this condition is not controlled, it may put you at risk for serious complications.  Your personal target blood pressure may vary depending on your medical conditions, your age, and other factors. For most people, a normal blood pressure is less than 120/80.  Hypertension is treated with lifestyle changes, medicines, or a combination of both. Lifestyle changes include weight loss, eating a healthy, low-sodium diet, exercising more, and limiting alcohol. This information is not intended to replace advice given to you by your health  care provider. Make sure you discuss any questions you have with your health care provider. Document Released: 10/05/2005 Document Revised: 09/02/2016 Document Reviewed: 09/02/2016 Elsevier Interactive Patient Education  2018 [employer].   Stop taking Lisinopril 5mg  due to cough. Start taking Amlodipine 2.5mg  daily. Follow Heart Healthy Diet and continue to increase regular movement. Please check your BP daily and record in log-bring with you at your next appt. Please follow-up in 6 weeks for BP check. Please call clinic with any questions/concerns.

## 2017-04-06 ENCOUNTER — Ambulatory Visit (INDEPENDENT_AMBULATORY_CARE_PROVIDER_SITE_OTHER): Payer: Managed Care, Other (non HMO) | Admitting: Family Medicine

## 2017-04-06 ENCOUNTER — Encounter: Payer: Self-pay | Admitting: Adult Health

## 2017-04-06 ENCOUNTER — Encounter: Payer: Self-pay | Admitting: Family Medicine

## 2017-04-06 VITALS — BP 144/85 | HR 102 | Temp 99.4°F | Ht 76.0 in | Wt 313.9 lb

## 2017-04-06 DIAGNOSIS — L03319 Cellulitis of trunk, unspecified: Secondary | ICD-10-CM | POA: Diagnosis not present

## 2017-04-06 DIAGNOSIS — Z8614 Personal history of Methicillin resistant Staphylococcus aureus infection: Secondary | ICD-10-CM

## 2017-04-06 DIAGNOSIS — R509 Fever, unspecified: Secondary | ICD-10-CM | POA: Diagnosis not present

## 2017-04-06 DIAGNOSIS — L02219 Cutaneous abscess of trunk, unspecified: Secondary | ICD-10-CM

## 2017-04-06 LAB — POCT UA - GLUCOSE/PROTEIN
Glucose, UA: NEGATIVE
Protein, UA: NEGATIVE

## 2017-04-06 NOTE — Progress Notes (Signed)
Acute Care Office visit  Assessment and plan:  1. Cellulitis and abscess of trunk   2. Fever chills   3. History of MRSA infection     - UA in office today - stat labs today- check cmp and cbc - since going to Derm tomorrow--> will hold off on ABX today as not to mess with possible cx if they do I and D tom etc.  - called and got pt's Derm appt moved up to first thing tomorrow morning. - txmnt for F/C d/c pt and use warm compress to area multiple times per day.  - see AVS for additional instructions given to pt   Orders Placed This Encounter  Procedures  . CBC with Differential/Platelet  . Comprehensive metabolic panel    Gross side effects, risk and benefits, and alternatives of medications discussed with patient.  Patient is aware that all medications have potential side effects and we are unable to predict every sideeffect or drug-drug interaction that may occur.  Expresses verbal understanding and consents to current therapy plan and treatment regiment.  Return if symptoms worsen or fail to improve, for Please go to dermatology 9 AM tomorrow.  Dr. Marguerite Olea .  Please see AVS handed out to patient at the end of our visit for additional patient instructions/ counseling done pertaining to today's office visit.  Note: This document was prepared using Dragon voice recognition software and may include unintentional dictation errors.    Subjective:    Chief Complaint  Patient presents with  . Fever    x 2 days - bodyaches, headaches     HPI:    C/o:  Eduardo [name] has a history of shaving his back and getting frequent cellulitis is from hair follicles--> 3 days ago noticed on his left side of his mid back a large area of tenderness of skin after Eduardo [name] picked at a pustule. + got red and hot- now has cellulitis.    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  Last seen in March 2017 in the ER for this.  Seeing Derm tomorrow for  this cellulitis.      2 days ago started with fever and chills, body aches, headache and even hurts to rub his hands over his hair.  No nausea or vomiting or diarrhea.  No cough, no SOB/ dib.  No increase in his sinus allergy type symptoms lately.  No increased congestion, no sore throat, no rhinorrhea.        Past medical history, Surgical history, Family history reviewed and noted below, Social history, Allergies, and Medications have been entered into the medical record, reviewed and changed as needed.   No Known Allergies  Review of Systems: General:   + F/C, no wt loss Pulm:   No DIB, pleuritic chest pain Card:  No CP, palpitations Abd:  No n/v/d or pain Ext:  No inc edema from baseline   Objective:   Blood pressure (!) 144/85, pulse (!) 102, temperature 99.4 F (37.4 C), height 6\' 4"  (1.93 m), weight (!) 313 lb 14.4 oz (142.4 kg), SpO2 96 %. Body mass index is 38.21 kg/m. General: Well Developed, well nourished, appropriate for stated age.  Neuro: Alert and oriented x3, extra-ocular muscles intact, sensation grossly intact.  HEENT: Normocephalic, atraumatic, pupils equal round reactive to light, neck supple, no masses, no painful lymphadenopathy, TM's intact B/L, no acute findings. OP- clear, No TTP sinuses Skin: Warm  and dry; + rash - large area cellulitis and induration L mid back--> area 5cm * 9cm, warm and red. No pustule present or fluctulence.  No tracking Cardiac: RRR, S1 S2,  no murmurs rubs or gallops.  Respiratory: ECTA B/L and A/P, Not using accessory muscles, speaking in full sentences- unlabored. Vascular:  No gross lower ext edema, cap RF less 2 sec. Psych: No HI/SI, judgement and insight good, Euthymic mood. Full Affect.   Patient Care Team    Relationship Specialty Notifications Start End  Mina Marble D, NP PCP - General Family Medicine  03/03/17   Allyn Kenner, MD  Dermatology  04/06/17

## 2017-04-06 NOTE — Patient Instructions (Signed)
Put a warm washcloth on your back for 15 minutes every hour.  Tylenol and advil around the clock for fevers/ body aches  Please eat bland foods like just chicken noodle soup and non-fatty bland foods until you see dermatology tomorrow.  Please drink excess water roughly 150-200 ounces per day.  Please know work or exerting herself just rest today.  If your fevers are greater than [id number] despite taking Tylenol and Advil and your symptoms are worsening then go to the ER.  Out of work today--> please give pt note for today and tomorrow.  Diag: cellulitis, fevers/ chills, illness Going to see another doctor tomorrow 9 am--> out of work tomorrow as well.

## 2017-04-07 LAB — CBC WITH DIFFERENTIAL/PLATELET
Basophils Absolute: 0.1 10*3/uL (ref 0.0–0.2)
Basos: 0 %
EOS (ABSOLUTE): 0.2 10*3/uL (ref 0.0–0.4)
Eos: 1 %
Hematocrit: 46.6 % (ref 37.5–51.0)
Hemoglobin: 15.6 g/dL (ref 13.0–17.7)
Immature Grans (Abs): 0 10*3/uL (ref 0.0–0.1)
Immature Granulocytes: 0 %
Lymphocytes Absolute: 3.5 10*3/uL — ABNORMAL HIGH (ref 0.7–3.1)
Lymphs: 17 %
MCH: 31.8 pg (ref 26.6–33.0)
MCHC: 33.5 g/dL (ref 31.5–35.7)
MCV: 95 fL (ref 79–97)
Monocytes Absolute: 1.4 10*3/uL — ABNORMAL HIGH (ref 0.1–0.9)
Monocytes: 7 %
Neutrophils Absolute: 14.9 10*3/uL — ABNORMAL HIGH (ref 1.4–7.0)
Neutrophils: 75 %
Platelets: 181 10*3/uL (ref 150–379)
RBC: 4.91 x10E6/uL (ref 4.14–5.80)
RDW: 14 % (ref 12.3–15.4)
WBC: 20.1 10*3/uL (ref 3.4–10.8)

## 2017-04-07 LAB — COMPREHENSIVE METABOLIC PANEL
ALT: 18 IU/L (ref 0–44)
AST: 11 IU/L (ref 0–40)
Albumin/Globulin Ratio: 1.4 (ref 1.2–2.2)
Albumin: 4.4 g/dL (ref 3.5–5.5)
Alkaline Phosphatase: 105 IU/L (ref 39–117)
BUN/Creatinine Ratio: 10 (ref 9–20)
BUN: 13 mg/dL (ref 6–24)
Bilirubin Total: 1.1 mg/dL (ref 0.0–1.2)
CO2: 22 mmol/L (ref 20–29)
Calcium: 9.6 mg/dL (ref 8.7–10.2)
Chloride: 99 mmol/L (ref 96–106)
Creatinine, Ser: 1.24 mg/dL (ref 0.76–1.27)
GFR calc Af Amer: 79 mL/min/{1.73_m2} (ref >59)
GFR calc non Af Amer: 68 mL/min/{1.73_m2} (ref >59)
Globulin, Total: 3.1 g/dL (ref 1.5–4.5)
Glucose: 77 mg/dL (ref 65–99)
Potassium: 4 mmol/L (ref 3.5–5.2)
Sodium: 138 mmol/L (ref 134–144)
Total Protein: 7.5 g/dL (ref 6.0–8.5)

## 2017-04-10 ENCOUNTER — Encounter (HOSPITAL_BASED_OUTPATIENT_CLINIC_OR_DEPARTMENT_OTHER): Payer: Self-pay | Admitting: Emergency Medicine

## 2017-04-10 ENCOUNTER — Emergency Department (HOSPITAL_COMMUNITY): Payer: Managed Care, Other (non HMO) | Admitting: Anesthesiology

## 2017-04-10 ENCOUNTER — Encounter (HOSPITAL_COMMUNITY): Admission: EM | Disposition: A | Discharge status: Home / Self Care | Payer: Self-pay | Source: Home / Self Care

## 2017-04-10 ENCOUNTER — Inpatient Hospital Stay (HOSPITAL_BASED_OUTPATIENT_CLINIC_OR_DEPARTMENT_OTHER)
Admission: EM | Admit: 2017-04-10 | Discharge: 2017-04-13 | DRG: 603 | Disposition: A | Discharge status: Home / Self Care | Payer: Managed Care, Other (non HMO) | Attending: Surgery | Admitting: Surgery

## 2017-04-10 DIAGNOSIS — F411 Generalized anxiety disorder: Secondary | ICD-10-CM | POA: Diagnosis present

## 2017-04-10 DIAGNOSIS — F17200 Nicotine dependence, unspecified, uncomplicated: Secondary | ICD-10-CM | POA: Diagnosis present

## 2017-04-10 DIAGNOSIS — L988 Other specified disorders of the skin and subcutaneous tissue: Secondary | ICD-10-CM

## 2017-04-10 DIAGNOSIS — E669 Obesity, unspecified: Secondary | ICD-10-CM | POA: Diagnosis present

## 2017-04-10 DIAGNOSIS — L739 Follicular disorder, unspecified: Secondary | ICD-10-CM

## 2017-04-10 DIAGNOSIS — Z79899 Other long term (current) drug therapy: Secondary | ICD-10-CM

## 2017-04-10 DIAGNOSIS — L723 Sebaceous cyst: Secondary | ICD-10-CM | POA: Diagnosis present

## 2017-04-10 DIAGNOSIS — L0591 Pilonidal cyst without abscess: Secondary | ICD-10-CM | POA: Diagnosis present

## 2017-04-10 DIAGNOSIS — F329 Major depressive disorder, single episode, unspecified: Secondary | ICD-10-CM | POA: Diagnosis present

## 2017-04-10 DIAGNOSIS — F17210 Nicotine dependence, cigarettes, uncomplicated: Secondary | ICD-10-CM | POA: Diagnosis present

## 2017-04-10 DIAGNOSIS — L02219 Cutaneous abscess of trunk, unspecified: Secondary | ICD-10-CM | POA: Diagnosis present

## 2017-04-10 DIAGNOSIS — Z8614 Personal history of Methicillin resistant Staphylococcus aureus infection: Secondary | ICD-10-CM | POA: Diagnosis present

## 2017-04-10 DIAGNOSIS — Z6838 Body mass index (BMI) 38.0-38.9, adult: Secondary | ICD-10-CM

## 2017-04-10 DIAGNOSIS — I10 Essential (primary) hypertension: Secondary | ICD-10-CM | POA: Diagnosis present

## 2017-04-10 DIAGNOSIS — L02212 Cutaneous abscess of back [any part, except buttock]: Principal | ICD-10-CM | POA: Diagnosis present

## 2017-04-10 DIAGNOSIS — L03319 Cellulitis of trunk, unspecified: Secondary | ICD-10-CM

## 2017-04-10 DIAGNOSIS — E1142 Type 2 diabetes mellitus with diabetic polyneuropathy: Secondary | ICD-10-CM | POA: Diagnosis present

## 2017-04-10 DIAGNOSIS — L700 Acne vulgaris: Secondary | ICD-10-CM

## 2017-04-10 DIAGNOSIS — B9562 Methicillin resistant Staphylococcus aureus infection as the cause of diseases classified elsewhere: Secondary | ICD-10-CM | POA: Diagnosis present

## 2017-04-10 DIAGNOSIS — L03312 Cellulitis of back [any part except buttock]: Secondary | ICD-10-CM | POA: Diagnosis present

## 2017-04-10 HISTORY — PX: PILONIDAL CYST DRAINAGE: SHX743

## 2017-04-10 LAB — BASIC METABOLIC PANEL
Anion gap: 9 (ref 5–15)
BUN: 18 mg/dL (ref 6–20)
CO2: 26 mmol/L (ref 22–32)
Calcium: 8.8 mg/dL — ABNORMAL LOW (ref 8.9–10.3)
Chloride: 101 mmol/L (ref 101–111)
Creatinine, Ser: 1.17 mg/dL (ref 0.61–1.24)
GFR calc Af Amer: >60 mL/min (ref >60)
GFR calc non Af Amer: >60 mL/min (ref >60)
Glucose, Bld: 81 mg/dL (ref 65–99)
Potassium: 4.2 mmol/L (ref 3.5–5.1)
Sodium: 136 mmol/L (ref 135–145)

## 2017-04-10 LAB — CBC WITH DIFFERENTIAL/PLATELET
Basophils Absolute: 0.1 10*3/uL (ref 0.0–0.1)
Basophils Relative: 1 %
Eosinophils Absolute: 0.6 10*3/uL (ref 0.0–0.7)
Eosinophils Relative: 5 %
HCT: 39 % (ref 39.0–52.0)
Hemoglobin: 14.2 g/dL (ref 13.0–17.0)
Lymphocytes Relative: 22 %
Lymphs Abs: 2.7 10*3/uL (ref 0.7–4.0)
MCH: 32.3 pg (ref 26.0–34.0)
MCHC: 36.4 g/dL — ABNORMAL HIGH (ref 30.0–36.0)
MCV: 88.6 fL (ref 78.0–100.0)
Monocytes Absolute: 1 10*3/uL (ref 0.1–1.0)
Monocytes Relative: 8 %
Neutro Abs: 7.9 10*3/uL — ABNORMAL HIGH (ref 1.7–7.7)
Neutrophils Relative %: 64 %
Platelets: 214 10*3/uL (ref 150–400)
RBC: 4.4 MIL/uL (ref 4.22–5.81)
RDW: 13.3 % (ref 11.5–15.5)
WBC: 12.3 10*3/uL — ABNORMAL HIGH (ref 4.0–10.5)

## 2017-04-10 LAB — GLUCOSE, CAPILLARY: Glucose-Capillary: 114 mg/dL — ABNORMAL HIGH (ref 65–99)

## 2017-04-10 SURGERY — EXCISION, PILONIDAL CYST
Anesthesia: General

## 2017-04-10 MED ORDER — MENTHOL 3 MG MT LOZG
1.0000 | LOZENGE | OROMUCOSAL | Status: DC | PRN
Start: 2017-04-10 — End: 2017-04-13

## 2017-04-10 MED ORDER — HYDROMORPHONE HCL 1 MG/ML IJ SOLN
0.5000 mg | INTRAMUSCULAR | Status: DC | PRN
  Administered 2017-04-12 – 2017-04-13 (×4): 1 mg via INTRAVENOUS
  Filled 2017-04-10 (×4): qty 1

## 2017-04-10 MED ORDER — HYDROCORTISONE 1 % EX CREA
1.0000 "application " | TOPICAL_CREAM | Freq: Three times a day (TID) | CUTANEOUS | Status: DC | PRN
  Filled 2017-04-10: qty 28

## 2017-04-10 MED ORDER — MIDAZOLAM HCL 2 MG/2ML IJ SOLN
INTRAMUSCULAR | Status: AC
  Filled 2017-04-10: qty 2

## 2017-04-10 MED ORDER — CELECOXIB 200 MG PO CAPS
400.0000 mg | ORAL_CAPSULE | ORAL | Status: AC
  Administered 2017-04-10: 400 mg via ORAL

## 2017-04-10 MED ORDER — POLYETHYLENE GLYCOL 3350 17 G PO PACK: 17.0000 g | PACK | Freq: Two times a day (BID) | ORAL | Status: DC | PRN

## 2017-04-10 MED ORDER — GABAPENTIN 300 MG PO CAPS
ORAL_CAPSULE | ORAL | Status: AC
  Filled 2017-04-10: qty 1

## 2017-04-10 MED ORDER — CHLORHEXIDINE GLUCONATE CLOTH 2 % EX PADS: 6.0000 | MEDICATED_PAD | Freq: Once | CUTANEOUS | Status: DC

## 2017-04-10 MED ORDER — MORPHINE SULFATE (PF) 4 MG/ML IV SOLN
4.0000 mg | Freq: Once | INTRAVENOUS | Status: AC
  Administered 2017-04-10: 4 mg via INTRAVENOUS
  Filled 2017-04-10: qty 1

## 2017-04-10 MED ORDER — PROPOFOL 10 MG/ML IV BOLUS
INTRAVENOUS | Status: DC | PRN
  Administered 2017-04-10: 250 mg via INTRAVENOUS

## 2017-04-10 MED ORDER — DIPHENHYDRAMINE HCL 25 MG PO CAPS
50.0000 mg | ORAL_CAPSULE | Freq: Every evening | ORAL | Status: DC | PRN
  Filled 2017-04-10: qty 2

## 2017-04-10 MED ORDER — ACETAMINOPHEN 500 MG PO TABS
ORAL_TABLET | ORAL | Status: AC
  Filled 2017-04-10: qty 2

## 2017-04-10 MED ORDER — DEXAMETHASONE SODIUM PHOSPHATE 10 MG/ML IJ SOLN
INTRAMUSCULAR | Status: DC | PRN
  Administered 2017-04-10: 5 mg via INTRAVENOUS

## 2017-04-10 MED ORDER — HYDROMORPHONE HCL 1 MG/ML IJ SOLN: 0.5000 mg | INTRAMUSCULAR | Status: DC | PRN

## 2017-04-10 MED ORDER — LIP MEDEX EX OINT
1.0000 "application " | TOPICAL_OINTMENT | Freq: Two times a day (BID) | CUTANEOUS | Status: DC
  Administered 2017-04-11 – 2017-04-13 (×3): 1 via TOPICAL
  Filled 2017-04-10 (×2): qty 7

## 2017-04-10 MED ORDER — DEXAMETHASONE SODIUM PHOSPHATE 10 MG/ML IJ SOLN
INTRAMUSCULAR | Status: AC
  Filled 2017-04-10: qty 1

## 2017-04-10 MED ORDER — MAGIC MOUTHWASH
15.0000 mL | Freq: Four times a day (QID) | ORAL | Status: DC | PRN
  Filled 2017-04-10: qty 15

## 2017-04-10 MED ORDER — PROMETHAZINE HCL 25 MG/ML IJ SOLN: 6.2500 mg | INTRAMUSCULAR | Status: DC | PRN

## 2017-04-10 MED ORDER — AMLODIPINE BESYLATE 5 MG PO TABS
2.5000 mg | ORAL_TABLET | Freq: Every day | ORAL | Status: DC
  Administered 2017-04-11 – 2017-04-13 (×3): 2.5 mg via ORAL
  Filled 2017-04-10 (×3): qty 1

## 2017-04-10 MED ORDER — ADULT MULTIVITAMIN W/MINERALS CH
1.0000 | ORAL_TABLET | Freq: Every day | ORAL | Status: DC
  Administered 2017-04-11 – 2017-04-13 (×3): 1 via ORAL
  Filled 2017-04-10 (×3): qty 1

## 2017-04-10 MED ORDER — HYDROCORTISONE 2.5 % RE CREA
1.0000 "application " | TOPICAL_CREAM | Freq: Four times a day (QID) | RECTAL | Status: DC | PRN
  Filled 2017-04-10: qty 28.35

## 2017-04-10 MED ORDER — HYDROMORPHONE HCL 1 MG/ML IJ SOLN: 0.2500 mg | INTRAMUSCULAR | Status: DC | PRN

## 2017-04-10 MED ORDER — FENTANYL CITRATE (PF) 100 MCG/2ML IJ SOLN
INTRAMUSCULAR | Status: DC | PRN
  Administered 2017-04-10 (×2): 50 ug via INTRAVENOUS

## 2017-04-10 MED ORDER — ONDANSETRON HCL 4 MG/2ML IJ SOLN: 4.0000 mg | Freq: Four times a day (QID) | INTRAMUSCULAR | Status: DC | PRN

## 2017-04-10 MED ORDER — BUPIVACAINE-EPINEPHRINE (PF) 0.25% -1:200000 IJ SOLN
INTRAMUSCULAR | Status: AC
  Filled 2017-04-10: qty 30

## 2017-04-10 MED ORDER — CELECOXIB 200 MG PO CAPS
ORAL_CAPSULE | ORAL | Status: AC
  Filled 2017-04-10: qty 2

## 2017-04-10 MED ORDER — SODIUM CHLORIDE 0.9 % IV SOLN: 250.0000 mL | INTRAVENOUS | Status: DC | PRN

## 2017-04-10 MED ORDER — LACTATED RINGERS IV SOLN: 1000.0000 mL | Freq: Three times a day (TID) | INTRAVENOUS | Status: AC | PRN

## 2017-04-10 MED ORDER — POLYETHYLENE GLYCOL 3350 17 G PO PACK
17.0000 g | PACK | Freq: Every day | ORAL | Status: DC
  Administered 2017-04-11 – 2017-04-13 (×3): 17 g via ORAL
  Filled 2017-04-10 (×3): qty 1

## 2017-04-10 MED ORDER — ACETAMINOPHEN 500 MG PO TABS: 1000.0000 mg | ORAL_TABLET | ORAL | Status: DC

## 2017-04-10 MED ORDER — ONDANSETRON 4 MG PO TBDP: 4.0000 mg | ORAL_TABLET | Freq: Four times a day (QID) | ORAL | Status: DC | PRN

## 2017-04-10 MED ORDER — SUGAMMADEX SODIUM 500 MG/5ML IV SOLN
INTRAVENOUS | Status: AC
  Filled 2017-04-10: qty 5

## 2017-04-10 MED ORDER — SUGAMMADEX SODIUM 200 MG/2ML IV SOLN
INTRAVENOUS | Status: DC | PRN
  Administered 2017-04-10: 300 mg via INTRAVENOUS

## 2017-04-10 MED ORDER — LIDOCAINE 2% (20 MG/ML) 5 ML SYRINGE
INTRAMUSCULAR | Status: AC
  Filled 2017-04-10: qty 5

## 2017-04-10 MED ORDER — SUCCINYLCHOLINE CHLORIDE 200 MG/10ML IV SOSY
PREFILLED_SYRINGE | INTRAVENOUS | Status: DC | PRN
  Administered 2017-04-10: 140 mg via INTRAVENOUS

## 2017-04-10 MED ORDER — DIPHENHYDRAMINE HCL 12.5 MG/5ML PO ELIX: 12.5000 mg | ORAL_SOLUTION | Freq: Four times a day (QID) | ORAL | Status: DC | PRN

## 2017-04-10 MED ORDER — ROCURONIUM BROMIDE 50 MG/5ML IV SOSY
PREFILLED_SYRINGE | INTRAVENOUS | Status: AC
  Filled 2017-04-10: qty 5

## 2017-04-10 MED ORDER — BUPIVACAINE-EPINEPHRINE 0.25% -1:200000 IJ SOLN
INTRAMUSCULAR | Status: DC | PRN
  Administered 2017-04-10 (×2): 30 mL

## 2017-04-10 MED ORDER — LIDOCAINE 2% (20 MG/ML) 5 ML SYRINGE
INTRAMUSCULAR | Status: DC | PRN
  Administered 2017-04-10: 100 mg via INTRAVENOUS

## 2017-04-10 MED ORDER — OXYCODONE HCL 5 MG PO TABS
5.0000 mg | ORAL_TABLET | ORAL | Status: DC | PRN
  Administered 2017-04-11 (×2): 10 mg via ORAL
  Administered 2017-04-12 (×3): 5 mg via ORAL
  Administered 2017-04-13 (×2): 10 mg via ORAL
  Filled 2017-04-10 (×4): qty 2
  Filled 2017-04-10 (×3): qty 1

## 2017-04-10 MED ORDER — DIPHENHYDRAMINE-APAP (SLEEP) 25-500 MG PO TABS: 2.0000 | ORAL_TABLET | Freq: Every evening | ORAL | Status: DC | PRN

## 2017-04-10 MED ORDER — PROPOFOL 10 MG/ML IV BOLUS
INTRAVENOUS | Status: AC
  Filled 2017-04-10: qty 20

## 2017-04-10 MED ORDER — DIPHENHYDRAMINE HCL 50 MG/ML IJ SOLN: 12.5000 mg | Freq: Four times a day (QID) | INTRAMUSCULAR | Status: DC | PRN

## 2017-04-10 MED ORDER — SODIUM CHLORIDE 0.9% FLUSH
3.0000 mL | Freq: Two times a day (BID) | INTRAVENOUS | Status: DC
  Administered 2017-04-11: 3 mL via INTRAVENOUS

## 2017-04-10 MED ORDER — VANCOMYCIN HCL 10 G IV SOLR
1500.0000 mg | INTRAVENOUS | Status: AC
  Administered 2017-04-10: 1500 mg via INTRAVENOUS
  Filled 2017-04-10: qty 1500

## 2017-04-10 MED ORDER — SUCCINYLCHOLINE CHLORIDE 200 MG/10ML IV SOSY
PREFILLED_SYRINGE | INTRAVENOUS | Status: AC
  Filled 2017-04-10: qty 10

## 2017-04-10 MED ORDER — LACTATED RINGERS IV SOLN
INTRAVENOUS | Status: DC | PRN
  Administered 2017-04-10: 21:00:00 via INTRAVENOUS

## 2017-04-10 MED ORDER — PREGABALIN 75 MG PO CAPS
300.0000 mg | ORAL_CAPSULE | Freq: Two times a day (BID) | ORAL | Status: DC
  Administered 2017-04-11 – 2017-04-13 (×6): 300 mg via ORAL
  Filled 2017-04-10 (×6): qty 4

## 2017-04-10 MED ORDER — GUAIFENESIN-DM 100-10 MG/5ML PO SYRP: 10.0000 mL | ORAL_SOLUTION | ORAL | Status: DC | PRN

## 2017-04-10 MED ORDER — ROCURONIUM BROMIDE 50 MG/5ML IV SOSY
PREFILLED_SYRINGE | INTRAVENOUS | Status: DC | PRN
  Administered 2017-04-10: 50 mg via INTRAVENOUS

## 2017-04-10 MED ORDER — FENTANYL CITRATE (PF) 100 MCG/2ML IJ SOLN
INTRAMUSCULAR | Status: AC
  Filled 2017-04-10: qty 2

## 2017-04-10 MED ORDER — HYDRALAZINE HCL 20 MG/ML IJ SOLN
5.0000 mg | INTRAMUSCULAR | Status: DC | PRN
  Filled 2017-04-10: qty 1

## 2017-04-10 MED ORDER — GABAPENTIN 300 MG PO CAPS
300.0000 mg | ORAL_CAPSULE | ORAL | Status: AC
  Administered 2017-04-10: 300 mg via ORAL

## 2017-04-10 MED ORDER — PHENOL 1.4 % MT LIQD
1.0000 | OROMUCOSAL | Status: DC | PRN
  Filled 2017-04-10: qty 177

## 2017-04-10 MED ORDER — ACETAMINOPHEN 500 MG PO TABS
1000.0000 mg | ORAL_TABLET | Freq: Three times a day (TID) | ORAL | Status: DC
  Administered 2017-04-11 – 2017-04-13 (×10): 1000 mg via ORAL
  Filled 2017-04-10 (×10): qty 2

## 2017-04-10 MED ORDER — BISACODYL 10 MG RE SUPP: 10.0000 mg | Freq: Every day | RECTAL | Status: DC | PRN

## 2017-04-10 MED ORDER — ACETAMINOPHEN 500 MG PO TABS
1000.0000 mg | ORAL_TABLET | ORAL | Status: AC
  Administered 2017-04-10: 1000 mg via ORAL

## 2017-04-10 MED ORDER — DEXTROSE 5 % IV SOLN
3.0000 g | INTRAVENOUS | Status: AC
  Administered 2017-04-10: 3 g via INTRAVENOUS
  Filled 2017-04-10: qty 3

## 2017-04-10 MED ORDER — METHOCARBAMOL 500 MG PO TABS
500.0000 mg | ORAL_TABLET | Freq: Four times a day (QID) | ORAL | Status: DC | PRN
  Administered 2017-04-11 – 2017-04-13 (×3): 500 mg via ORAL
  Filled 2017-04-10 (×3): qty 1

## 2017-04-10 MED ORDER — ENOXAPARIN SODIUM 40 MG/0.4ML ~~LOC~~ SOLN
40.0000 mg | Freq: Every day | SUBCUTANEOUS | Status: DC
  Administered 2017-04-11 – 2017-04-13 (×3): 40 mg via SUBCUTANEOUS
  Filled 2017-04-10 (×3): qty 0.4

## 2017-04-10 MED ORDER — SODIUM CHLORIDE 0.9% FLUSH: 3.0000 mL | INTRAVENOUS | Status: DC | PRN

## 2017-04-10 MED ORDER — MIDAZOLAM HCL 5 MG/5ML IJ SOLN
INTRAMUSCULAR | Status: DC | PRN
  Administered 2017-04-10: 2 mg via INTRAVENOUS

## 2017-04-10 MED ORDER — DOXYCYCLINE HYCLATE 100 MG PO TABS
100.0000 mg | ORAL_TABLET | Freq: Two times a day (BID) | ORAL | Status: DC
  Administered 2017-04-11: 100 mg via ORAL
  Filled 2017-04-10: qty 1

## 2017-04-10 MED ORDER — SODIUM CHLORIDE 0.9 % IR SOLN
Status: DC | PRN
  Administered 2017-04-10: 1000 mL

## 2017-04-10 MED ORDER — ALUM & MAG HYDROXIDE-SIMETH 200-200-20 MG/5ML PO SUSP: 30.0000 mL | Freq: Four times a day (QID) | ORAL | Status: DC | PRN

## 2017-04-10 MED ORDER — PROCHLORPERAZINE EDISYLATE 5 MG/ML IJ SOLN: 5.0000 mg | INTRAMUSCULAR | Status: DC | PRN

## 2017-04-10 MED ORDER — SIMETHICONE 80 MG PO CHEW: 40.0000 mg | CHEWABLE_TABLET | Freq: Four times a day (QID) | ORAL | Status: DC | PRN

## 2017-04-10 MED ORDER — ONDANSETRON HCL 4 MG/2ML IJ SOLN
INTRAMUSCULAR | Status: DC | PRN
  Administered 2017-04-10: 4 mg via INTRAVENOUS

## 2017-04-10 SURGICAL SUPPLY — 30 items
BLADE SURG 15 STRL LF DISP TIS (BLADE) ×1 IMPLANT
BLADE SURG 15 STRL SS (BLADE) ×2
BNDG GAUZE ELAST 4 BULKY (GAUZE/BANDAGES/DRESSINGS) ×3 IMPLANT
BRIEF STRETCH FOR OB PAD LRG (UNDERPADS AND DIAPERS) IMPLANT
COVER SURGICAL LIGHT HANDLE (MISCELLANEOUS) ×3 IMPLANT
DRAPE LAPAROTOMY T 102X78X121 (DRAPES) ×3 IMPLANT
DRSG PAD ABDOMINAL 8X10 ST (GAUZE/BANDAGES/DRESSINGS) IMPLANT
DRSG TEGADERM 2-3/8X2-3/4 SM (GAUZE/BANDAGES/DRESSINGS) ×3 IMPLANT
DRSG TEGADERM 4X4.75 (GAUZE/BANDAGES/DRESSINGS) ×3 IMPLANT
ELECT PENCIL ROCKER SW 15FT (MISCELLANEOUS) ×3 IMPLANT
ELECT REM PT RETURN 15FT ADLT (MISCELLANEOUS) ×3 IMPLANT
GAUZE SPONGE 2X2 8PLY STRL LF (GAUZE/BANDAGES/DRESSINGS) ×2 IMPLANT
GAUZE SPONGE 4X4 12PLY STRL (GAUZE/BANDAGES/DRESSINGS) ×3 IMPLANT
GAUZE SPONGE 4X4 16PLY XRAY LF (GAUZE/BANDAGES/DRESSINGS) ×3 IMPLANT
GLOVE ECLIPSE 8.0 STRL XLNG CF (GLOVE) ×3 IMPLANT
GLOVE INDICATOR 8.0 STRL GRN (GLOVE) ×3 IMPLANT
GOWN STRL REUS W/TWL XL LVL3 (GOWN DISPOSABLE) ×6 IMPLANT
KIT BASIN OR (CUSTOM PROCEDURE TRAY) ×3 IMPLANT
LUBRICANT JELLY K Y 4OZ (MISCELLANEOUS) ×3 IMPLANT
NEEDLE HYPO 22GX1.5 SAFETY (NEEDLE) ×3 IMPLANT
PACK BASIC VI WITH GOWN DISP (CUSTOM PROCEDURE TRAY) ×3 IMPLANT
PAD ABD 8X10 STRL (GAUZE/BANDAGES/DRESSINGS) ×3 IMPLANT
PUNCH BIOPSY 3 (MISCELLANEOUS) ×3 IMPLANT
SPONGE GAUZE 2X2 STER 10/PKG (GAUZE/BANDAGES/DRESSINGS) ×4
SYR 20CC LL (SYRINGE) ×3 IMPLANT
SYR BULB IRRIGATION 50ML (SYRINGE) ×3 IMPLANT
TOWEL OR 17X26 10 PK STRL BLUE (TOWEL DISPOSABLE) ×3 IMPLANT
TOWEL OR NON WOVEN STRL DISP B (DISPOSABLE) ×3 IMPLANT
TUBE ANAEROBIC SPECIMEN COL (MISCELLANEOUS) ×3 IMPLANT
YANKAUER SUCT BULB TIP 10FT TU (MISCELLANEOUS) ×3 IMPLANT

## 2017-04-10 NOTE — Op Note (Signed)
04/10/2017  10:00 PM  PATIENT:  Eduardo [name]  46 y.o. male  Patient Care Team: [name], NP as PCP - General (Family Medicine) Allyn Kenner, MD (Dermatology)  PRE-OPERATIVE DIAGNOSIS:   Back abscess Chronic pilonidal disease  POST-OPERATIVE DIAGNOSIS:  Back abscess Infected back sebaceous cyst with chronic blackheads Chronic pilonidal disease  PROCEDURE:   IRRIGATION AND DEBRIDEMENT BACK ABSCESS Unroofing pilonidal disease  SURGEON:  [name], MD  ASSISTANT: RN   ANESTHESIA:   local and general  EBL:  No intake/output data recorded.  Delay start of Pharmacological VTE agent (>24hrs) due to surgical blood loss or risk of bleeding:  no  DRAINS: none   SPECIMEN:    1.  Back abscess for C&S 2.  Pilonidal disease cores x 3 for pathology  COUNTS:  YES  PLAN OF CARE: Admit for overnight observation  PATIENT DISPOSITION:  PACU - hemodynamically stable.  INDICATION: Obese smoking male with chronic recurrent skin infections and MRSA.  The QRS with blackheads.  Pilonidal disease.  Enlarging painful abscess on back refractory to antibiotics.  Seen by medicine and dermatology.  Came worse.  Came to [facility] emergency room.  Concern for large abscess.  Surgical consultation requested.  Transferred from [facility] emergent department for evaluation  The anatomy and physiology of skin abscesses was discussed. Pathophysiology of SQ abscess, possible progression to fasciitis & sepsis, etc discussed . I stressed good hygiene & wound care. Possible redebridement was discussed as well.   Possibility of recurrence was discussed. Risks, benefits, alternatives were discussed. I noted a good likelihood this will help address the problem. Risks of anesthesia and other risks discussed. Questions answered. The patient is does wish to proceed.   OR FINDINGS:   5cm deep abscess on the left lower thoracic back stretching from paraspinal region to posterior axillary  line.  25 x 4 cm region.  Numerous blackheads on back.  2 cm sebaceous cyst unroofed and excised upper thoracic midline  Chronic pilonidal disease with three pets containing retained hairs.  Core excisions of the sinus pits and debridement done.  No evidence of any deeper abscess.  DESCRIPTION:   Informed consent was confirmed. The patient received IV antibiotics. The patient underwent general anesthesia without any difficulty. The patient was positioned prone.  Patient had significant hair burden especially the intergluteal cleft.  This was carefully clipped.  The area around the abscess was prepped and draped in a sterile fashion. A surgical timeout confirmed our plan.   Using an 18-gauge needle to aspirate frank pus on the left thoracic back abscess.  I made an incision over the most fluctuant area of the mass.   Excised would seem to be a chronic wound.  I placed my finger into the abscess cavity to break up loculations.  It tracked close to the paraspinal region midline into the posterior axillary  line, following the induration.  Copious irrigation was done.  Fascia exposed and intact.  Model muscle underneath intact.  Therefore, no necrotizing fasciitis.    Patient had an infected sebaceous cyst in the upper back.  A few core biopsies done around this region to help remove the cecum and cyst in the area and debrided.  Pilonidal disease noted intergluteal cleft.  A hairs within it.  Did core biopsy excisions of the three chronic pets at healthier tissue.  Cautery done to the base.  Hemostasis good.  Any deeper cavity.  Just chronic scar.  It off on  doing wider excision the setting of an infected abscess.    Hemostasis was good.  A small upper back upon all disease was covered with gauze.  The large mid back wound was packed with a 6 inch Kerlix antibiotic-soaked rolled gauze.  Sterile dressings applied.  Patient is being extubated go to recovery room.   We plan to continue IV antibiotics and  begin wound care training tomorrow.    I am about to discuss OR findings with family per the patient's request  [name], M.D., F.A.C.S. Gastrointestinal and Minimally Invasive Surgery [facility] [address], [place], [place] [address] [phone] Main / Paging

## 2017-04-10 NOTE — ED Provider Notes (Signed)
[facility] DEPT Provider Note   CSN: [id number] Arrival date & time: 04/10/17  1235     History   Chief Complaint Chief Complaint  Patient presents with  . Abscess    HPI Eduardo [name] is a 46 y.o. male.  Patient presents from [place] ED, for abscess to left mid back that has failed antibiotic therapy. Patient noticed abscess 7 days ago, was seen by primary care initially, then dermatology on Wednesday, who prescribed doxycycline. He has been taking 100 mg twice a day of Doxycycline since then. Patient reported to [facility] today for persistent symptoms, including pain, worsening redness, purulent drainage. Patient states he has had intermittent subjective fevers and chills. Ultrasound done prior to transfer to this ED, showing large area of fluid. Dr. Johney Maine, general surgeon, was consulted by provider at [facility], who suggested patient get transferred here for eval and possible surgical management. Patient has history of pilonidal cyst, with MRSA. He has had ongoing issues of intermittent small abscesses, however this particular abscess is the worst. No history of diabetes or immunocompromise.      Past Medical History:  Diagnosis Date  . Anxiety   . Chronic back pain   . Degenerative disk disease   . Depression   . Hypertension   . MRSA (methicillin resistant staph aureus) culture positive   . Peripheral neuropathy   . Pilonidal cyst     Patient Active Problem List   Diagnosis Date Noted  . Cellulitis and abscess of trunk 04/06/2017  . Fever chills 04/06/2017  . History of MRSA infection 04/06/2017  . Health care maintenance 03/03/2017  . Hydradenitis 01/17/2016  . Neuropathy 09/28/2015  . Major depression 12/26/2012  . Generalized anxiety disorder 12/26/2012  . Opiate dependence ([place]) 12/24/2012  . Benzodiazepine dependence ([place]) 12/24/2012  . HTN (hypertension) 12/24/2012  . 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 OBESITY 09/09/2010   . TOBACCO ABUSE 09/09/2010  . LOW BACK PAIN, CHRONIC 09/09/2010    Past Surgical History:  Procedure Laterality Date  . THORACIC OUTLET SURGERY  2000  . WRIST FUSION  2002       Home Medications    Prior to Admission medications   Medication Sig Start Date End Date Taking? Authorizing Provider  acetaminophen (TYLENOL) 650 MG CR tablet Take 1,300 mg by mouth every 8 (eight) hours as needed for pain.   Yes [provider]  amLODipine (NORVASC) 2.5 MG tablet Take 1 tablet (2.5 mg total) by mouth daily. 04/01/17  Yes Danford, Valetta Fuller D, NP  diphenhydramine-acetaminophen (TYLENOL PM) 25-500 MG TABS tablet Take 2 tablets by mouth at bedtime as needed.   Yes [provider]  doxycycline (VIBRA-TABS) 100 MG tablet Take 100 mg by mouth 2 (two) times daily. 04/07/17  Yes [provider]  Multiple Vitamins-Minerals (MULTIVITAMIN WITH MINERALS) tablet Take 1 tablet by mouth daily.   Yes [provider]  pregabalin (LYRICA) 300 MG capsule Take 1 capsule (300 mg total) by mouth 2 (two) times daily. 12/30/16  Yes Melvenia Beam, MD    Family History Family History  Problem Relation Age of Onset  . Cancer Maternal Grandmother        breast, ovarian & colon  . Hypertension Mother   . Diabetes Mother   . Hypertension Father   . Heart disease Father   . Stroke Paternal Grandfather   . Neuropathy Neg Hx     Social History Social History  Substance Use Topics  .  Smoking status: Current Every Day Smoker    Packs/day: 1.00    Years: 15.00    Types: Cigarettes  . Smokeless tobacco: Never Used  . Alcohol use No     Comment: Rarely     Allergies   Patient has no known allergies.   Review of Systems Review of Systems  Constitutional: Positive for chills and fever.  HENT: Negative for trouble swallowing.   Eyes: Negative for visual disturbance.  Respiratory: Negative for shortness of breath.   Cardiovascular: Negative for chest pain.    Gastrointestinal: Negative for nausea and vomiting.  Genitourinary: Negative for dysuria.  Musculoskeletal: Negative for arthralgias.  Skin: Positive for wound (abscess to L mid back).  Allergic/Immunologic: Negative for immunocompromised state.     Physical Exam Updated Vital Signs BP (!) 141/84 (BP Location: Left Arm)   Pulse 92   Temp 98.5 F (36.9 C) (Oral)   Resp 16   Ht 6\' 4"  (1.93 m)   Wt (!) 142.9 kg (315 lb)   SpO2 99%   BMI 38.34 kg/m   Physical Exam  Constitutional: He appears well-developed and well-nourished. No distress.  HENT:  Head: Normocephalic and atraumatic.  Eyes: Conjunctivae are normal.  Cardiovascular: Normal rate, regular rhythm, normal heart sounds and intact distal pulses.   Pulmonary/Chest: Effort normal and breath sounds normal.  Abdominal: Soft. Bowel sounds are normal. There is no tenderness.  Neurological: He is alert.  Skin: Skin is warm.  Abscess with surrounding erythema and induration to left mid back. Area of induration is tender and about 10cm x 20cm.  Abscess not actively draining.   Psychiatric: He has a normal mood and affect. His behavior is normal.  Nursing note and vitals reviewed.    ED Treatments / Results  Labs (all labs ordered are listed, but only abnormal results are displayed) Labs Reviewed  CBC WITH DIFFERENTIAL/PLATELET - Abnormal; Notable for the following:       Result Value   WBC 12.3 (*)    MCHC 36.4 (*)    Neutro Abs 7.9 (*)    All other components within normal limits  BASIC METABOLIC PANEL - Abnormal; Notable for the following:    Calcium 8.8 (*)    All other components within normal limits  MRSA PCR SCREENING    EKG  EKG Interpretation None       Radiology No results found.  Procedures Procedures (including critical care time)  Medications Ordered in ED Medications - No data to display   Initial Impression / Assessment and Plan / ED Course  I have reviewed the triage vital signs and  the nursing notes.  Pertinent labs & imaging results that were available during my care of the patient were reviewed by me and considered in my medical decision making (see chart for details).     Patient with skin abscess that has failed outpatient management with doxycycline. Patient transferred here from [facility] for eval and General surgery consult. Ultrasound showing "fluid collection approximately 1.5 cm in depth over most of indurated area." Labs done at previous ED; WBC 12.3. Dr. Johney Maine, with general surgery, consulted and is accepting patient for operative management.  Patient discussed with and seen by Dr. [name].  The patient appears reasonably stabilized for admission considering the current resources, flow, and capabilities available in the ED at this time, and I doubt any other [facility] requiring further screening and/or treatment in the ED prior to admission.   Final Clinical Impressions(s) / ED  Diagnoses   Final diagnoses:  Cutaneous abscess of back excluding buttocks    New Prescriptions New Prescriptions   No medications on file     Russo, [place] N, PA-C 04/10/17 1946    Julianne Rice, MD 04/16/17 [phone]

## 2017-04-10 NOTE — ED Notes (Signed)
Patient transferred to [facility] ED via POV for surgery evaluation. Patient instructed to remain NPO until cleared by Surgery. IV Saline LOCK in tact

## 2017-04-10 NOTE — ED Provider Notes (Signed)
[facility] DEPT [facility] Provider Note   CSN: [id number] Arrival date & time: 04/10/17  1235     History   Chief Complaint Chief Complaint  Patient presents with  . Abscess    HPI Eduardo [name] is a 46 y.o. male.  Patient with h/o MRSA -- presents with worsening left middle back abscess that started 7 days ago. Patient first saw his primary care physician who started him on doxycycline 5 days ago. He saw a dermatologist the following day and he was continued on doxycycline. Since then, the area has continued to grow in size. It is draining purulent fluid at times. Patient has had fevers and chills, controlled with Tylenol. Last documented fever last night. No vomiting or diarrhea. The onset of this condition was acute. The course is worsening. Aggravating factors: none. Alleviating factors: none.        Past Medical History:  Diagnosis Date  . Anxiety   . Chronic back pain   . Degenerative disk disease   . Depression   . Hypertension   . MRSA (methicillin resistant staph aureus) culture positive   . Peripheral neuropathy   . Pilonidal cyst     Patient Active Problem List   Diagnosis Date Noted  . Cellulitis and abscess of trunk 04/06/2017  . Fever chills 04/06/2017  . History of MRSA infection 04/06/2017  . Health care maintenance 03/03/2017  . Hydradenitis 01/17/2016  . Neuropathy 09/28/2015  . Major depression 12/26/2012  . Generalized anxiety disorder 12/26/2012  . Opiate dependence ([place]) 12/24/2012  . Benzodiazepine dependence ([place]) 12/24/2012  . HTN (hypertension) 12/24/2012  . 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 OBESITY 09/09/2010  . TOBACCO ABUSE 09/09/2010  . LOW BACK PAIN, CHRONIC 09/09/2010    Past Surgical History:  Procedure Laterality Date  . THORACIC OUTLET SURGERY  2000  . WRIST FUSION  2002       Home Medications    Prior to Admission medications   Medication Sig Start Date End Date Taking? Authorizing Provider  amLODipine  (NORVASC) 2.5 MG tablet Take 1 tablet (2.5 mg total) by mouth daily. 04/01/17  Yes Danford, Valetta Fuller D, NP  pregabalin (LYRICA) 300 MG capsule Take 1 capsule (300 mg total) by mouth 2 (two) times daily. 12/30/16  Yes Melvenia Beam, MD  Acetaminophen (TYLENOL ARTHRITIS PAIN PO) Take 4 tablets by mouth daily. 650mg  tablet    [provider]  diphenhydramine-acetaminophen (TYLENOL PM) 25-500 MG TABS tablet Take 2 tablets by mouth at bedtime as needed.    [provider]  Multiple Vitamins-Minerals (MULTIVITAMIN WITH MINERALS) tablet Take 1 tablet by mouth daily.    [provider]    Family History Family History  Problem Relation Age of Onset  . Cancer Maternal Grandmother        breast, ovarian & colon  . Hypertension Mother   . Diabetes Mother   . Hypertension Father   . Heart disease Father   . Stroke Paternal Grandfather   . Neuropathy Neg Hx     Social History Social History  Substance Use Topics  . Smoking status: Current Every Day Smoker    Packs/day: 1.00    Years: 15.00    Types: Cigarettes  . Smokeless tobacco: Never Used  . Alcohol use No     Comment: Rarely     Allergies   Patient has no known allergies.   Review of Systems Review of Systems  Constitutional: Positive for chills and fever.  HENT: Negative for  rhinorrhea and sore throat.   Eyes: Negative for redness.  Respiratory: Negative for cough.   Cardiovascular: Negative for chest pain.  Gastrointestinal: Negative for abdominal pain, diarrhea, nausea and vomiting.  Genitourinary: Negative for dysuria.  Musculoskeletal: Negative for myalgias.  Skin: Positive for color change. Negative for rash.       Positive for abscess  Neurological: Negative for headaches.  Hematological: Negative for adenopathy.     Physical Exam Updated Vital Signs BP 118/77 (BP Location: Right Arm)   Pulse 88   Temp 98.8 F (37.1 C) (Oral)   Resp 20   Ht 6\' 4"  (1.93 m)   Wt (!) 142.9 kg (315 lb)    SpO2 98%   BMI 38.34 kg/m   Physical Exam  Constitutional: He appears well-developed and well-nourished.  HENT:  Head: Normocephalic and atraumatic.  Eyes: Conjunctivae are normal. Right eye exhibits no discharge. Left eye exhibits no discharge.  Neck: Normal range of motion. Neck supple.  Cardiovascular: Normal rate, regular rhythm and normal heart sounds.   Pulmonary/Chest: Effort normal and breath sounds normal.  Abdominal: Soft. There is no tenderness.  Neurological: He is alert.  Skin: Skin is warm and dry.  20cm x 10cm area of induration L middle back consistent with soft tissue infection/abscess. No active drainage.  Psychiatric: He has a normal mood and affect.  Nursing note and vitals reviewed.    ED Treatments / Results  Labs (all labs ordered are listed, but only abnormal results are displayed) Labs Reviewed  CBC WITH DIFFERENTIAL/PLATELET - Abnormal; Notable for the following:       Result Value   WBC 12.3 (*)    MCHC 36.4 (*)    Neutro Abs 7.9 (*)    All other components within normal limits  BASIC METABOLIC PANEL - Abnormal; Notable for the following:    Calcium 8.8 (*)    All other components within normal limits    Radiology No results found.  Procedures Procedures (including critical care time)  Medications Ordered in ED Medications - No data to display   Initial Impression / Assessment and Plan / ED Course  I have reviewed the triage vital signs and the nursing notes.  Pertinent labs & imaging results that were available during my care of the patient were reviewed by me and considered in my medical decision making (see chart for details).     Patient seen and examined. Discussed with Dr. Billy Fischer. Bedside soft tissue US performed by myself. There appears to be fluid collection approximately 1.5 cm in depth over most of indurated area. It is more irregular in some areas rather than a well-defined fluid collection.    Vital signs reviewed  and are as follows: BP 118/77 (BP Location: Right Arm)   Pulse 88   Temp 98.8 F (37.1 C) (Oral)   Resp 20   Ht 6\' 4"  (1.93 m)   Wt (!) 142.9 kg (315 lb)   SpO2 98%   BMI 38.34 kg/m   Spoke with Dr. Johney Maine by OR nurse. Directs patient to go to the ED to be evaluated by EDP. He will consult if necessary. Transfer accepted by Dr. [name].   Pt and wife updated. They will go directly to [facility] long emergency department. Instructed not to eat. Patient will have IV in place.   Final Clinical Impressions(s) / ED Diagnoses   Final diagnoses:  Cutaneous abscess of back excluding buttocks   Patient with large cutaneous abscess, continued fevers, leukocytosis, and has  clinically failed outpatient antibiotics with doxycycline. Abscess was large and irregular, will require surgical consultation for appropriate and complete incision and drainage.   New Prescriptions New Prescriptions   No medications on file     [name], Hershal Coria 04/10/17 1536    Gareth Morgan, MD 04/14/17 1407

## 2017-04-10 NOTE — Transfer of Care (Signed)
Immediate Anesthesia Transfer of Care Note  Patient: Eduardo [name]  Procedure(s) Performed: Procedure(s): IRRIGATION AND DEBRIDEMENT BACK ABSCESS ([name])  Patient Location: PACU  Anesthesia Type:General  Level of Consciousness:  alert, patient cooperative and responds to stimulation  Airway & Oxygen Therapy:Patient Spontanous Breathing and Patient connected to face mask oxgen  Post-op Assessment:  Report given to PACU RN and Post -op Vital signs reviewed and stable  Post vital signs:  Reviewed and stable  Last Vitals:  Vitals:   04/10/17 1640 04/10/17 1908  BP: (!) 141/84 129/83  Pulse: 92 86  Resp: 16 19  Temp: 03.1 C     Complications: No apparent anesthesia complications

## 2017-04-10 NOTE — ED Triage Notes (Signed)
Abscess to L flank, dx with cellulitis on Wednesday and started on doxycycline. Pt states the area is getting larger and more painful.

## 2017-04-10 NOTE — Anesthesia Procedure Notes (Signed)
Procedure Name: Intubation Date/Time: 04/10/2017 9:03 PM Performed by: Montel Clock Pre-anesthesia Checklist: Patient identified, Emergency Drugs available, Suction available, Patient being monitored and Timeout performed Patient Re-evaluated:Patient Re-evaluated prior to inductionOxygen Delivery Method: Circle system utilized Preoxygenation: Pre-oxygenation with 100% oxygen Intubation Type: IV induction Ventilation: Mask ventilation without difficulty, Two handed mask ventilation required and Oral airway inserted - appropriate to patient size Laryngoscope Size: Glidescope and 4 Grade View: Grade I Tube type: Oral Tube size: 7.5 mm Number of attempts: 1 Airway Equipment and Method: Stylet Placement Confirmation: ETT inserted through vocal cords under direct vision,  positive ETCO2 and breath sounds checked- equal and bilateral Secured at: 23 cm Tube secured with: Tape Dental Injury: Teeth and Oropharynx as per pre-operative assessment  Difficulty Due To: Difficult Airway- due to immobile epiglottis and Difficult Airway- due to anterior larynx Comments: MAC 3 good view of epiglottis, unable to lift adequately to view arytenoids or cords. Switched to Glidescope 4 blade with grade 1 view. ETT easily passed through cords.

## 2017-04-10 NOTE — Anesthesia Preprocedure Evaluation (Addendum)
Anesthesia Evaluation  Patient identified by MRN, date of birth, ID band Patient awake    Reviewed: Allergy & Precautions, NPO status , Patient's Chart, lab work & pertinent test results  Airway Mallampati: II  TM Distance: >3 FB Neck ROM: Full    Dental  (+) Dental Advisory Given   Pulmonary Current Smoker,    breath sounds clear to auscultation       Cardiovascular hypertension, Pt. on medications  Rhythm:Regular Rate:Normal     Neuro/Psych Anxiety Depression negative neurological ROS     GI/Hepatic negative GI ROS, Neg liver ROS,   Endo/Other  negative endocrine ROS  Renal/GU negative Renal ROS     Musculoskeletal  (+) Arthritis ,   Abdominal   Peds  Hematology negative hematology ROS (+)   Anesthesia Other Findings   Reproductive/Obstetrics                            Lab Results  Component Value Date   WBC 12.3 (H) 04/10/2017   HGB 14.2 04/10/2017   HCT 39.0 04/10/2017   MCV 88.6 04/10/2017   PLT 214 04/10/2017   Lab Results  Component Value Date   CREATININE 1.17 04/10/2017   BUN 18 04/10/2017   NA 136 04/10/2017   K 4.2 04/10/2017   CL 101 04/10/2017   CO2 26 04/10/2017    Anesthesia Physical Anesthesia Plan  ASA: II  Anesthesia Plan: General   Post-op Pain Management:    Induction: Intravenous  PONV Risk Score and Plan:   Airway Management Planned: Oral ETT  Additional Equipment:   Intra-op Plan:   Post-operative Plan: Extubation in OR  Informed Consent: I have reviewed the patients History and Physical, chart, labs and discussed the procedure including the risks, benefits and alternatives for the proposed anesthesia with the patient or authorized representative who has indicated his/her understanding and acceptance.   Dental advisory given  Plan Discussed with: CRNA  Anesthesia Plan Comments:         Anesthesia Quick Evaluation

## 2017-04-10 NOTE — H&P (Signed)
[name] [place], [place] [address] Phone: [phone] FAX: [phone]     [name]  September 26, 1971 [id number]  CARE TEAM:  PCP: [name], NP  Outpatient Care Team: Patient Care Team: [name], NP as PCP - General (Family Medicine) Allyn Kenner, MD (Dermatology)  Inpatient Treatment Team: Treatment Team: Attending Provider: Julianne Rice, MD; Physician Assistant: Russo, [place] N, PA-C; Consulting Physician: Edison Pace, Md, MD   This patient is a 46 y.o.male who presents today for surgical evaluation at the request of [name],  PA-C & Dr [name], [facility].   Chief complaint / Reason for evaluation: Worsening large back abscess with cellulitis.  46 year old smoking obese male.  History of MRSA with pilonidal abscess drainage and other small subcutaneous infections.  Recently.  However patient developed pain and swelling on his back.  Presumed to be abscess.  Started on oral antibiotics four days ago.  Worsened.  Went to [facility].  Swelling and pain on his back.  A 20 x 10 cm area of inflammation and induration with central fluctuance and erythema.  Suspicious for large abscess.  ER staff uncomfortable with attempting any incision and drainage.  Therefore, Surgical consultation requested.  Patient transferred to from [facility] for evaluation at [facility].  Wife in room.  Patient has history of diabetes.  Neuro cardiac or pulmonary issues.  Usually decent exercise tolerance.  History of pilonidal abscess drained a long time ago.  However it still intermittently drains.  He wonders if that is the problem in that the wound ever closes any get abscesses elsewhere.  He does not feel pain on his tailbone and does not think he has a pilonidal abscess today.     Assessment  Eduardo [name]  46 y.o. male       Problem List:  Principal Problem:   Cellulitis and abscess of  trunk Active Problems:   OBESITY   TOBACCO ABUSE   Chronic pilonidal disease   Generalized anxiety disorder   History of MRSA infection  Enlarging abscess on back with cellulitis and erythema in a smoking male with history of MRSA.  Worsening on oral antibiotics  Plan:  Operative incision and drainage.  Sent for cultures.  IV vancomycin and Zosyn.  Follow-up on cultures.  Most likely would benefit from elective resection and closure of the pilonidal disease in the future.  Would not do it now on an emergent setting.  Encourage smoking sensation.  STOP SMOKING! We talked to the patient about the dangers of smoking.  We stressed that tobacco use dramatically increases the risk of peri-operative complications such as infection, tissue necrosis leaving to problems with incision/wound and organ healing, hernia, chronic pain, heart attack, stroke, DVT, pulmonary embolism, and death.  We noted there are programs in our community to help stop smoking.  Information was available.  -VTE prophylaxis- SCDs, etc -mobilize as tolerated to help recovery  20 minutes spent in review, evaluation, examination, counseling, and coordination of care.  More than 50% of that time was spent in counseling.  [name], M.D., F.A.C.S. Gastrointestinal and Minimally Invasive Surgery [facility] [address], [place], [place] [address] [phone] Main / Paging   04/10/2017      Past Medical History:  Diagnosis Date  . Anxiety   . Chronic back pain   . Degenerative disk disease   .  Depression   . Hypertension   . MRSA (methicillin resistant staph aureus) culture positive   . Peripheral neuropathy   . Pilonidal cyst     Past Surgical History:  Procedure Laterality Date  . THORACIC OUTLET SURGERY  2000  . WRIST FUSION  2002    Social History   Social History  . Marital status: Divorced    Spouse name: [name]  . Number of children: 2  . Years of  education: 12th    Occupational History  . [name]    Social History Main Topics  . Smoking status: Current Every Day Smoker    Packs/day: 1.00    Years: 15.00    Types: Cigarettes  . Smokeless tobacco: Never Used  . Alcohol use No     Comment: Rarely  . Drug use: No  . Sexual activity: Not on file   Other Topics Concern  . Not on file   Social History Narrative   Lives at home w/ his step-mother   Right-handed   Drinks about 2-3 [place] a day    Family History  Problem Relation Age of Onset  . Cancer Maternal Grandmother        breast, ovarian & colon  . Hypertension Mother   . Diabetes Mother   . Hypertension Father   . Heart disease Father   . Stroke Paternal Grandfather   . Neuropathy Neg Hx     No current facility-administered medications for this encounter.    Current Outpatient Prescriptions  Medication Sig Dispense Refill  . amLODipine (NORVASC) 2.5 MG tablet Take 1 tablet (2.5 mg total) by mouth daily. 60 tablet 1  . pregabalin (LYRICA) 300 MG capsule Take 1 capsule (300 mg total) by mouth 2 (two) times daily. 60 capsule 5  . Acetaminophen (TYLENOL ARTHRITIS PAIN PO) Take 4 tablets by mouth daily. 636m tablet    . diphenhydramine-acetaminophen (TYLENOL PM) 25-500 MG TABS tablet Take 2 tablets by mouth at bedtime as needed.    . Multiple Vitamins-Minerals (MULTIVITAMIN WITH MINERALS) tablet Take 1 tablet by mouth daily.       No Known Allergies  ROS:   All other systems reviewed & are negative except per HPI or as noted below: Constitutional:  No fevers, chills, sweats.  Weight stable Eyes:  No vision changes, No discharge HENT:  No sore throats, nasal drainage Lymph: No neck swelling, No bruising easily Pulmonary:  No cough, productive sputum CV: No orthopnea, PND  Patient walks 60 minutes for about 2 miles without difficulty.  No exertional chest/neck/shoulder/arm pain. GI: No personal nor family history of GI/colon cancer, inflammatory bowel disease,  irritable bowel syndrome, allergy such as Celiac Sprue, dietary/dairy problems, colitis, ulcers nor gastritis.  No recent sick contacts/gastroenteritis.  No travel outside the country.  No changes in diet. Renal: No UTIs, No hematuria Genital:  No drainage, bleeding, masses Musculoskeletal: No severe joint pain.  Good ROM major joints Skin:  No sores or lesions.  No rashes Heme/Lymph:  No easy bleeding.  No swollen lymph nodes Neuro: No focal weakness/numbness.  No seizures Psych: No suicidal ideation.  No hallucinations  BP (!) 141/84 (BP Location: Left Arm)   Pulse 92   Temp 98.5 F (36.9 C) (Oral)   Resp 16   Ht 6' 4"  (1.93 m)   Wt (!) 142.9 kg (315 lb)   SpO2 99%   BMI 38.34 kg/m   Physical Exam: General: Pt awake/alert/oriented x4 in mild major acute distress Eyes: PERRL,  normal EOM. Sclera nonicteric Neuro: CN II-XII intact w/o focal sensory/motor deficits. Lymph: No head/neck/groin lymphadenopathy Psych:  No delerium/psychosis/paranoia HENT: Normocephalic, Mucus membranes moist.  No thrush Neck: Supple, No tracheal deviation Chest: No pain.  Good respiratory excursion. CV:  Pulses intact.  Regular rhythm Abdomen: Soft, Nondistended.  Nontender.  No incarcerated hernias. Gen:  No inguinal hernias.  No inguinal lymphadenopathy.   Ext:  SCDs BLE.  No significant edema.  No cyanosis Skin: No petechiae / purpurea.  No major sores  Musculoskeletal:  Posterior thoracic back is a 8 x 4 cm area of erythema with central fluctuance.  Swelling / edema & induration 20 x 9 cm.  Suspicious for deeper abscess. No severe joint pain.  Good ROM major joints   Results:   Labs: Results for orders placed or performed during the hospital encounter of 04/10/17 (from the past 48 hour(s))  CBC with Differential/Platelet     Status: Abnormal   Collection Time: 04/10/17  2:16 PM  Result Value Ref Range   WBC 12.3 (H) 4.0 - 10.5 K/uL   RBC 4.40 4.22 - 5.81 MIL/uL   Hemoglobin 14.2 13.0 -  17.0 g/dL   HCT 39.0 39.0 - 52.0 %   MCV 88.6 78.0 - 100.0 fL   MCH 32.3 26.0 - 34.0 pg   MCHC 36.4 (H) 30.0 - 36.0 g/dL   RDW 13.3 11.5 - 15.5 %   Platelets 214 150 - 400 K/uL   Neutrophils Relative % 64 %   Lymphocytes Relative 22 %   Monocytes Relative 8 %   Eosinophils Relative 5 %   Basophils Relative 1 %   Neutro Abs 7.9 (H) 1.7 - 7.7 K/uL   Lymphs Abs 2.7 0.7 - 4.0 K/uL   Monocytes Absolute 1.0 0.1 - 1.0 K/uL   Eosinophils Absolute 0.6 0.0 - 0.7 K/uL   Basophils Absolute 0.1 0.0 - 0.1 K/uL   WBC Morphology MILD LEFT SHIFT (1-5% METAS, OCC MYELO, OCC BANDS)     Comment: ATYPICAL LYMPHOCYTES TOXIC GRANULATION   Basic metabolic panel     Status: Abnormal   Collection Time: 04/10/17  2:16 PM  Result Value Ref Range   Sodium 136 135 - 145 mmol/L   Potassium 4.2 3.5 - 5.1 mmol/L   Chloride 101 101 - 111 mmol/L   CO2 26 22 - 32 mmol/L   Glucose, Bld 81 65 - 99 mg/dL   BUN 18 6 - 20 mg/dL   Creatinine, Ser 1.17 0.61 - 1.24 mg/dL   Calcium 8.8 (L) 8.9 - 10.3 mg/dL   GFR calc non Af Amer >60 >60 mL/min   GFR calc Af Amer >60 >60 mL/min    Comment: (NOTE) The eGFR has been calculated using the CKD EPI equation. This calculation has not been validated in all clinical situations. eGFR's persistently <60 mL/min signify possible Chronic Kidney Disease.    Anion gap 9 5 - 15    Imaging / Studies: No results found.  Medications / Allergies: per chart  Antibiotics: Anti-infectives    None        Note: Portions of this report may have been transcribed using voice recognition software. Every effort was made to ensure accuracy; however, inadvertent computerized transcription errors may be present.   Any transcriptional errors that result from this process are unintentional.    [name], M.D., F.A.C.S. Gastrointestinal and Minimally Invasive Surgery [facility] [address] [place], [place] [address] [phone] Main /  Paging   04/10/2017

## 2017-04-10 NOTE — Consult Note (Deleted)
[name] [place], [place] [address] Phone: [phone] FAX: [phone]     Eduardo [name]  1971/09/21 [id number]  CARE TEAM:  PCP: [name], NP  Outpatient Care Team: Patient Care Team: [name], NP as PCP - General (Family Medicine) Allyn Kenner, MD (Dermatology)  Inpatient Treatment Team: Treatment Team: Attending Provider: Julianne Rice, MD; Physician Assistant: Russo, [place] N, PA-C; Consulting Physician: Edison Pace, Md, MD   This patient is a 46 y.o.male who presents today for surgical evaluation at the request of [name],  PA-C & Dr [name], [facility].   Chief complaint / Reason for evaluation: Worsening large back abscess with cellulitis.  46 year old smoking obese male.  History of MRSA with pilonidal abscess drainage and other small subcutaneous infections.  Recently.  However patient developed pain and swelling on his back.  Presumed to be abscess.  Started on oral antibiotics four days ago.  Worsened.  Went to [facility].  Swelling and pain on his back.  A 20 x 10 cm area of inflammation and induration with central fluctuance and erythema.  Suspicious for large abscess.  ER staff uncomfortable with attempting any incision and drainage.  Therefore, Surgical consultation requested.  Patient transferred to from [facility] for evaluation at [facility].  Wife in room.  Patient has history of diabetes.  Neuro cardiac or pulmonary issues.  Usually decent exercise tolerance.  History of pilonidal abscess drained a long time ago.  However it still intermittently drains.  He wonders if that is the problem in that the wound ever closes any get abscesses elsewhere.  He does not feel pain on his tailbone and does not think he has a pilonidal abscess today.     Assessment  Eduardo [name]  46 y.o. male       Problem List:  Principal Problem:   Cellulitis and abscess  of trunk Active Problems:   OBESITY   TOBACCO ABUSE   Chronic pilonidal disease   Generalized anxiety disorder   History of MRSA infection  Enlarging abscess on back with cellulitis and erythema in a smoking male with history of MRSA.  Worsening on oral antibiotics  Plan:  Operative incision and drainage.  Sent for cultures.  IV vancomycin and Zosyn.  Follow-up on cultures.  Most likely would benefit from elective resection and closure of the pilonidal disease in the future.  Would not do it now on an emergent setting.  Encourage smoking sensation.  STOP SMOKING! We talked to the patient about the dangers of smoking.  We stressed that tobacco use dramatically increases the risk of peri-operative complications such as infection, tissue necrosis leaving to problems with incision/wound and organ healing, hernia, chronic pain, heart attack, stroke, DVT, pulmonary embolism, and death.  We noted there are programs in our community to help stop smoking.  Information was available.  -VTE prophylaxis- SCDs, etc -mobilize as tolerated to help recovery  20 minutes spent in review, evaluation, examination, counseling, and coordination of care.  More than 50% of that time was spent in counseling.  [name], M.D., F.A.C.S. Gastrointestinal and Minimally Invasive Surgery [facility] [address], [place], [place] [address] [phone] Main / Paging   04/10/2017      Past Medical History:  Diagnosis Date  . Anxiety   . Chronic back pain   . Degenerative disk disease   .  Depression   . Hypertension   . MRSA (methicillin resistant staph aureus) culture positive   . Peripheral neuropathy   . Pilonidal cyst     Past Surgical History:  Procedure Laterality Date  . THORACIC OUTLET SURGERY  2000  . WRIST FUSION  2002    Social History   Social History  . Marital status: Divorced    Spouse name: [name]  . Number of children: 2  . Years of  education: 12th    Occupational History  . [name]    Social History Main Topics  . Smoking status: Current Every Day Smoker    Packs/day: 1.00    Years: 15.00    Types: Cigarettes  . Smokeless tobacco: Never Used  . Alcohol use No     Comment: Rarely  . Drug use: No  . Sexual activity: Not on file   Other Topics Concern  . Not on file   Social History Narrative   Lives at home w/ his step-mother   Right-handed   Drinks about 2-3 [place] [employer] [place] [employer] a day    Family History  Problem Relation Age of Onset  . Cancer Maternal Grandmother        breast, ovarian & colon  . Hypertension Mother   . Diabetes Mother   . Hypertension Father   . Heart disease Father   . Stroke Paternal Grandfather   . Neuropathy Neg Hx     No current facility-administered medications for this encounter.    Current Outpatient Prescriptions  Medication Sig Dispense Refill  . amLODipine (NORVASC) 2.5 MG tablet Take 1 tablet (2.5 mg total) by mouth daily. 60 tablet 1  . pregabalin (LYRICA) 300 MG capsule Take 1 capsule (300 mg total) by mouth 2 (two) times daily. 60 capsule 5  . Acetaminophen (TYLENOL ARTHRITIS PAIN PO) Take 4 tablets by mouth daily. 661m tablet    . diphenhydramine-acetaminophen (TYLENOL PM) 25-500 MG TABS tablet Take 2 tablets by mouth at bedtime as needed.    . Multiple Vitamins-Minerals (MULTIVITAMIN WITH MINERALS) tablet Take 1 tablet by mouth daily.       No Known Allergies  ROS:   All other systems reviewed & are negative except per HPI or as noted below: Constitutional:  No fevers, chills, sweats.  Weight stable Eyes:  No vision changes, No discharge HENT:  No sore throats, nasal drainage Lymph: No neck swelling, No bruising easily Pulmonary:  No cough, productive sputum CV: No orthopnea, PND  Patient walks 60 minutes for about 2 miles without difficulty.  No exertional chest/neck/shoulder/arm pain. GI: No personal nor family history of GI/colon cancer, inflammatory bowel disease,  irritable bowel syndrome, allergy such as Celiac Sprue, dietary/dairy problems, colitis, ulcers nor gastritis.  No recent sick contacts/gastroenteritis.  No travel outside the country.  No changes in diet. Renal: No UTIs, No hematuria Genital:  No drainage, bleeding, masses Musculoskeletal: No severe joint pain.  Good ROM major joints Skin:  No sores or lesions.  No rashes Heme/Lymph:  No easy bleeding.  No swollen lymph nodes Neuro: No focal weakness/numbness.  No seizures Psych: No suicidal ideation.  No hallucinations  BP (!) 141/84 (BP Location: Left Arm)   Pulse 92   Temp 98.5 F (36.9 C) (Oral)   Resp 16   Ht 6' 4"  (1.93 m)   Wt (!) 142.9 kg (315 lb)   SpO2 99%   BMI 38.34 kg/m   Physical Exam: General: Pt awake/alert/oriented x4 in mild major acute distress Eyes: PERRL,  normal EOM. Sclera nonicteric Neuro: CN II-XII intact w/o focal sensory/motor deficits. Lymph: No head/neck/groin lymphadenopathy Psych:  No delerium/psychosis/paranoia HENT: Normocephalic, Mucus membranes moist.  No thrush Neck: Supple, No tracheal deviation Chest: No pain.  Good respiratory excursion. CV:  Pulses intact.  Regular rhythm Abdomen: Soft, Nondistended.  Nontender.  No incarcerated hernias. Gen:  No inguinal hernias.  No inguinal lymphadenopathy.   Ext:  SCDs BLE.  No significant edema.  No cyanosis Skin: No petechiae / purpurea.  No major sores  Musculoskeletal:  Posterior thoracic back is a 8 x 4 cm area of erythema with central fluctuance.  Swelling / edema & induration 20 x 9 cm.  Suspicious for deeper abscess. No severe joint pain.  Good ROM major joints   Results:   Labs: Results for orders placed or performed during the hospital encounter of 04/10/17 (from the past 48 hour(s))  CBC with Differential/Platelet     Status: Abnormal   Collection Time: 04/10/17  2:16 PM  Result Value Ref Range   WBC 12.3 (H) 4.0 - 10.5 K/uL   RBC 4.40 4.22 - 5.81 MIL/uL   Hemoglobin 14.2 13.0 -  17.0 g/dL   HCT 39.0 39.0 - 52.0 %   MCV 88.6 78.0 - 100.0 fL   MCH 32.3 26.0 - 34.0 pg   MCHC 36.4 (H) 30.0 - 36.0 g/dL   RDW 13.3 11.5 - 15.5 %   Platelets 214 150 - 400 K/uL   Neutrophils Relative % 64 %   Lymphocytes Relative 22 %   Monocytes Relative 8 %   Eosinophils Relative 5 %   Basophils Relative 1 %   Neutro Abs 7.9 (H) 1.7 - 7.7 K/uL   Lymphs Abs 2.7 0.7 - 4.0 K/uL   Monocytes Absolute 1.0 0.1 - 1.0 K/uL   Eosinophils Absolute 0.6 0.0 - 0.7 K/uL   Basophils Absolute 0.1 0.0 - 0.1 K/uL   WBC Morphology MILD LEFT SHIFT (1-5% METAS, OCC MYELO, OCC BANDS)     Comment: ATYPICAL LYMPHOCYTES TOXIC GRANULATION   Basic metabolic panel     Status: Abnormal   Collection Time: 04/10/17  2:16 PM  Result Value Ref Range   Sodium 136 135 - 145 mmol/L   Potassium 4.2 3.5 - 5.1 mmol/L   Chloride 101 101 - 111 mmol/L   CO2 26 22 - 32 mmol/L   Glucose, Bld 81 65 - 99 mg/dL   BUN 18 6 - 20 mg/dL   Creatinine, Ser 1.17 0.61 - 1.24 mg/dL   Calcium 8.8 (L) 8.9 - 10.3 mg/dL   GFR calc non Af Amer >60 >60 mL/min   GFR calc Af Amer >60 >60 mL/min    Comment: (NOTE) The eGFR has been calculated using the CKD EPI equation. This calculation has not been validated in all clinical situations. eGFR's persistently <60 mL/min signify possible Chronic Kidney Disease.    Anion gap 9 5 - 15    Imaging / Studies: No results found.  Medications / Allergies: per chart  Antibiotics: Anti-infectives    None        Note: Portions of this report may have been transcribed using voice recognition software. Every effort was made to ensure accuracy; however, inadvertent computerized transcription errors may be present.   Any transcriptional errors that result from this process are unintentional.    [name], M.D., F.A.C.S. Gastrointestinal and Minimally Invasive Surgery [facility] [address] [place], [place] [address] [phone] Main /  Paging   04/10/2017

## 2017-04-11 LAB — MRSA PCR SCREENING: MRSA by PCR: NEGATIVE

## 2017-04-11 MED ORDER — PNEUMOCOCCAL VAC POLYVALENT 25 MCG/0.5ML IJ INJ
0.5000 mL | INJECTION | INTRAMUSCULAR | Status: DC
  Filled 2017-04-11: qty 0.5

## 2017-04-11 MED ORDER — DIPHENHYDRAMINE-APAP (SLEEP) 25-500 MG PO TABS: 2.0000 | ORAL_TABLET | Freq: Every evening | ORAL | Status: DC | PRN

## 2017-04-11 MED ORDER — NICOTINE 21 MG/24HR TD PT24
21.0000 mg | MEDICATED_PATCH | Freq: Every day | TRANSDERMAL | Status: DC
  Administered 2017-04-11 – 2017-04-13 (×5): 21 mg via TRANSDERMAL
  Filled 2017-04-11 (×5): qty 1

## 2017-04-11 MED ORDER — DIPHENHYDRAMINE HCL 25 MG PO CAPS
50.0000 mg | ORAL_CAPSULE | Freq: Every evening | ORAL | Status: DC | PRN
  Administered 2017-04-12: 50 mg via ORAL

## 2017-04-11 MED ORDER — PIPERACILLIN-TAZOBACTAM 3.375 G IVPB
3.3750 g | Freq: Three times a day (TID) | INTRAVENOUS | Status: DC
  Administered 2017-04-11 – 2017-04-13 (×7): 3.375 g via INTRAVENOUS
  Filled 2017-04-11 (×6): qty 50

## 2017-04-11 MED ORDER — ACETAMINOPHEN 500 MG PO TABS: 1000.0000 mg | ORAL_TABLET | Freq: Every evening | ORAL | Status: DC | PRN

## 2017-04-11 NOTE — Progress Notes (Signed)
Patient ID: Eduardo [name], male   DOB: [birth year], 46 y.o.   MRN: [id number] 1 Day Post-Op   Subjective: Sore but feels better than before surgery. No other complaints.  Objective: Vital signs in last 24 hours: Temp:  [97.9 F (36.6 C)-98.8 F (37.1 C)] 97.9 F (36.6 C) (06/24 0550) Pulse Rate:  [78-97] 88 (06/24 0550) Resp:  [14-20] 16 (06/24 0550) BP: (113-141)/(56-84) 117/56 (06/24 0550) SpO2:  [93 %-100 %] 98 % (06/24 0550) Weight:  [142.9 kg (315 lb)] 142.9 kg (315 lb) (06/23 1241) Last BM Date: 04/10/17  Intake/Output from previous day: 06/23 0701 - 06/24 0700 In: 41 [P.O.:720; I.V.:100] Out: 710 [Urine:700; Blood:10] Intake/Output this shift: No intake/output data recorded.  General appearance: alert, cooperative, no distress and moderately obese Incision/Wound: Dressing on back just changed, clean and dry. I did not remove.  Lab Results:   Recent Labs  04/10/17 1416  WBC 12.3*  HGB 14.2  HCT 39.0  PLT 214   BMET  Recent Labs  04/10/17 1416  NA 136  K 4.2  CL 101  CO2 26  GLUCOSE 81  BUN 18  CREATININE 1.17  CALCIUM 8.8*     Studies/Results: No results found.  Anti-infectives: Anti-infectives    Start     Dose/Rate Route Frequency Ordered Stop   04/10/17 2330  doxycycline (VIBRA-TABS) tablet 100 mg  Status:  Discontinued     100 mg Oral 2 times daily 04/10/17 2322 04/11/17 0835   04/10/17 1900  vancomycin (VANCOCIN) 1,500 mg in sodium chloride 0.9 % 500 mL IVPB     1,500 mg 250 mL/hr over 120 Minutes Intravenous On call to O.R. 04/10/17 1737 04/10/17 2310   04/10/17 1900  ceFAZolin (ANCEF) 3 g in dextrose 5 % 50 mL IVPB     3 g 130 mL/hr over 30 Minutes Intravenous On call to O.R. 04/10/17 1737 04/10/17 2110      Assessment/Plan: s/p Procedure(s): Incision and drainage and debridement of large abscess of back Clinically improved. Continue IV antibiotics and wound care today. Possible discharge home tomorrow on oral antibiotics.  Check CBC in a.m.    LOS: 0 days    Yamari Ventola T 04/11/2017

## 2017-04-11 NOTE — Progress Notes (Signed)
Pharmacy Antibiotic Note  Eduardo [name] is a 46 y.o. male admitted on 04/10/2017 with Back abscess.  Pharmacy has been consulted for zosyn  dosing. S/p I&D large abscess of back.   Plan: Zosyn 3.375g IV q8h (4 hour infusion).  Pharmacy to sign off  Height: 6\' 4"  (193 cm) Weight: (!) 315 lb (142.9 kg) IBW/kg (Calculated) : 86.8  Temp (24hrs), Avg:98.2 F (36.8 C), Min:97.9 F (36.6 C), Max:98.8 F (37.1 C)   Recent Labs Lab 04/06/17 1020 04/10/17 1416  WBC 20.1* 12.3*  CREATININE 1.24 1.17    Estimated Creatinine Clearance: 119.3 mL/min (by C-G formula based on SCr of 1.17 mg/dL).    No Known Allergies  Eudelia Bunch, Pharm.D. [phone] 04/11/2017 8:53 AM

## 2017-04-11 NOTE — Anesthesia Postprocedure Evaluation (Signed)
Anesthesia Post Note  Patient: Eduardo [name]  Procedure(s) Performed: Procedure(s) (LRB): IRRIGATION AND DEBRIDEMENT BACK ABSCESS ([name])     Patient location during evaluation: PACU Anesthesia Type: General Level of consciousness: awake and alert Pain management: pain level controlled Vital Signs Assessment: post-procedure vital signs reviewed and stable Respiratory status: spontaneous breathing, nonlabored ventilation, respiratory function stable and patient connected to nasal cannula oxygen Cardiovascular status: blood pressure returned to baseline and stable Postop Assessment: no signs of nausea or vomiting Anesthetic complications: no    Last Vitals:  Vitals:   04/11/17 0550 04/11/17 1432  BP: (!) 117/56 (!) 142/80  Pulse: 88 92  Resp: 16 16  Temp: 36.6 C 36.8 C    Last Pain:  Vitals:   04/11/17 1435  TempSrc:   PainSc: 5                  Tiajuana Amass

## 2017-04-11 NOTE — Progress Notes (Signed)
Patient arrived on the unit at approximately 2245. He is alert and verbally responsive. Dressings to back dry and intact. Voiced no complaints at this time.

## 2017-04-12 ENCOUNTER — Encounter (HOSPITAL_COMMUNITY): Payer: Self-pay | Admitting: Surgery

## 2017-04-12 LAB — BASIC METABOLIC PANEL
Anion gap: 8 (ref 5–15)
BUN: 18 mg/dL (ref 6–20)
CO2: 27 mmol/L (ref 22–32)
Calcium: 8.9 mg/dL (ref 8.9–10.3)
Chloride: 103 mmol/L (ref 101–111)
Creatinine, Ser: 1.1 mg/dL (ref 0.61–1.24)
GFR calc Af Amer: >60 mL/min (ref >60)
GFR calc non Af Amer: >60 mL/min (ref >60)
Glucose, Bld: 122 mg/dL — ABNORMAL HIGH (ref 65–99)
Potassium: 4.3 mmol/L (ref 3.5–5.1)
Sodium: 138 mmol/L (ref 135–145)

## 2017-04-12 LAB — CBC
HCT: 38.6 % — ABNORMAL LOW (ref 39.0–52.0)
Hemoglobin: 13.7 g/dL (ref 13.0–17.0)
MCH: 30.9 pg (ref 26.0–34.0)
MCHC: 35.5 g/dL (ref 30.0–36.0)
MCV: 87.1 fL (ref 78.0–100.0)
Platelets: 248 10*3/uL (ref 150–400)
RBC: 4.43 MIL/uL (ref 4.22–5.81)
RDW: 12.9 % (ref 11.5–15.5)
WBC: 17.9 10*3/uL — ABNORMAL HIGH (ref 4.0–10.5)

## 2017-04-12 MED ORDER — VANCOMYCIN HCL 10 G IV SOLR
1250.0000 mg | Freq: Two times a day (BID) | INTRAVENOUS | Status: DC
  Administered 2017-04-13 (×2): 1250 mg via INTRAVENOUS
  Filled 2017-04-12 (×2): qty 1250

## 2017-04-12 MED ORDER — VANCOMYCIN HCL 10 G IV SOLR
2500.0000 mg | Freq: Once | INTRAVENOUS | Status: AC
  Administered 2017-04-12: 2500 mg via INTRAVENOUS
  Filled 2017-04-12: qty 2500

## 2017-04-12 NOTE — Progress Notes (Signed)
Assessment Principal Problem:   Cellulitis and abscess of back s/p I&D 04/10/2017-GPCs in clusters on gram stain; area of induration redness lateral to open wound Active Problems:   OBESITY   TOBACCO ABUSE   Chronic pilonidal disease   Generalized anxiety disorder   History of MRSA infection    Plan:  Shower daily.  Start Vancomycin.  Recheck wound tomorrow.  If better, discharge on oral abxs to cover MRSA.  If lateral area worse tomorrow, back to OR for more debridement.   LOS: 1 day     2 Days Post-Op  Chief Complaint/Subjective: Sore around wounds.  Objective: Vital signs in last 24 hours: Temp:  [97.9 F (36.6 C)-98.3 F (36.8 C)] 97.9 F (36.6 C) (06/25 0448) Pulse Rate:  [83-92] 83 (06/25 0448) Resp:  [16-18] 18 (06/25 0448) BP: (105-142)/(59-81) 105/59 (06/25 0448) SpO2:  [90 %-96 %] 90 % (06/25 0448) Last BM Date: 04/10/17  Intake/Output from previous day: 06/24 0701 - 06/25 0700 In: 1816 [P.O.:1570; IV Piggyback:246] Out: 4010 [Urine:4550] Intake/Output this shift: No intake/output data recorded.  PE: General- In NAD.  Awake and alert. Back-open wound is clean internally with no purulent drainage; some redness and induration lateral to area(marked); other small wounds are clean.  Lab Results:   Recent Labs  04/10/17 1416 04/12/17 0456  WBC 12.3* 17.9*  HGB 14.2 13.7  HCT 39.0 38.6*  PLT 214 248   BMET  Recent Labs  04/10/17 1416  NA 136  K 4.2  CL 101  CO2 26  GLUCOSE 81  BUN 18  CREATININE 1.17  CALCIUM 8.8*   PT/INR No results for input(s): LABPROT, INR in the last 72 hours. Comprehensive Metabolic Panel:    Component Value Date/Time   NA 136 04/10/2017 1416   NA 138 04/06/2017 1020   NA 141 12/30/2016 0933   K 4.2 04/10/2017 1416   K 4.0 04/06/2017 1020   CL 101 04/10/2017 1416   CL 99 04/06/2017 1020   CO2 26 04/10/2017 1416   CO2 22 04/06/2017 1020   BUN 18 04/10/2017 1416   BUN 13 04/06/2017 1020   BUN 14 12/30/2016  0933   CREATININE 1.17 04/10/2017 1416   CREATININE 1.24 04/06/2017 1020   CREATININE 1.12 10/25/2014 1124   GLUCOSE 81 04/10/2017 1416   GLUCOSE 77 04/06/2017 1020   GLUCOSE 89 12/30/2016 0933   GLUCOSE 64 (L) 10/25/2014 1124   CALCIUM 8.8 (L) 04/10/2017 1416   CALCIUM 9.6 04/06/2017 1020   AST 11 04/06/2017 1020   AST 17 12/30/2016 0933   ALT 18 04/06/2017 1020   ALT 23 12/30/2016 0933   ALKPHOS 105 04/06/2017 1020   ALKPHOS 100 12/30/2016 0933   BILITOT 1.1 04/06/2017 1020   BILITOT 0.5 12/30/2016 0933   PROT 7.5 04/06/2017 1020   PROT 7.3 12/30/2016 0933   ALBUMIN 4.4 04/06/2017 1020   ALBUMIN 4.5 12/30/2016 0933     Studies/Results: No results found.  Anti-infectives: Anti-infectives    Start     Dose/Rate Route Frequency Ordered Stop   04/11/17 1000  piperacillin-tazobactam (ZOSYN) IVPB 3.375 g     3.375 g 12.5 mL/hr over 240 Minutes Intravenous Every 8 hours 04/11/17 0852     04/10/17 2330  doxycycline (VIBRA-TABS) tablet 100 mg  Status:  Discontinued     100 mg Oral 2 times daily 04/10/17 2322 04/11/17 0835   04/10/17 1900  vancomycin (VANCOCIN) 1,500 mg in sodium chloride 0.9 % 500 mL IVPB  1,500 mg 250 mL/hr over 120 Minutes Intravenous On call to O.R. 04/10/17 1737 04/10/17 2310   04/10/17 1900  ceFAZolin (ANCEF) 3 g in dextrose 5 % 50 mL IVPB     3 g 130 mL/hr over 30 Minutes Intravenous On call to O.R. 04/10/17 1737 04/10/17 2110       Qiara Minetti J 04/12/2017

## 2017-04-12 NOTE — Progress Notes (Signed)
Pharmacy Antibiotic Note  Eduardo [name] is a 46 y.o. male admitted on 04/10/2017 with Back abscess.  Pharmacy has been consulted for Zosyn dosing on 6/24 S/p I&D large abscess of back. To add vancomycin per Md orders with GPC in clusters in preliminary results of back abscess culter  Plan: Zosyn 3.375g IV q8h (4 hour infusion).  Vancomycin 2500mg  x 1 then 1250mg  IV q12  Height: 6\' 4"  (193 cm) Weight: (!) 315 lb (142.9 kg) IBW/kg (Calculated) : 86.8  Temp (24hrs), Avg:98.1 F (36.7 C), Min:97.9 F (36.6 C), Max:98.3 F (36.8 C)   Recent Labs Lab 04/06/17 1020 04/10/17 1416 04/12/17 0456  WBC 20.1* 12.3* 17.9*  CREATININE 1.24 1.17  --     Estimated Creatinine Clearance: 119.3 mL/min (by C-G formula based on SCr of 1.17 mg/dL).    No Known Allergies   Adrian Saran, PharmD, BCPS Pager [phone] 04/12/2017 10:38 AM

## 2017-04-13 LAB — BASIC METABOLIC PANEL
Anion gap: 7 (ref 5–15)
BUN: 15 mg/dL (ref 6–20)
CO2: 27 mmol/L (ref 22–32)
Calcium: 8.4 mg/dL — ABNORMAL LOW (ref 8.9–10.3)
Chloride: 107 mmol/L (ref 101–111)
Creatinine, Ser: 0.9 mg/dL (ref 0.61–1.24)
GFR calc Af Amer: >60 mL/min (ref >60)
GFR calc non Af Amer: >60 mL/min (ref >60)
Glucose, Bld: 124 mg/dL — ABNORMAL HIGH (ref 65–99)
Potassium: 4.2 mmol/L (ref 3.5–5.1)
Sodium: 141 mmol/L (ref 135–145)

## 2017-04-13 LAB — CBC
HCT: 38.8 % — ABNORMAL LOW (ref 39.0–52.0)
Hemoglobin: 13.4 g/dL (ref 13.0–17.0)
MCH: 31.5 pg (ref 26.0–34.0)
MCHC: 34.5 g/dL (ref 30.0–36.0)
MCV: 91.1 fL (ref 78.0–100.0)
Platelets: 240 10*3/uL (ref 150–400)
RBC: 4.26 MIL/uL (ref 4.22–5.81)
RDW: 13.6 % (ref 11.5–15.5)
WBC: 12.2 10*3/uL — ABNORMAL HIGH (ref 4.0–10.5)

## 2017-04-13 MED ORDER — SACCHAROMYCES BOULARDII 250 MG PO CAPS: ORAL_CAPSULE | ORAL | Status: DC

## 2017-04-13 MED ORDER — CHLORHEXIDINE GLUCONATE 4 % EX LIQD: CUTANEOUS | 0 refills | Status: DC

## 2017-04-13 MED ORDER — OXYCODONE HCL 5 MG PO TABS: 5.0000 mg | ORAL_TABLET | ORAL | 0 refills | Status: DC | PRN

## 2017-04-13 MED ORDER — NICOTINE 21 MG/24HR TD PT24: MEDICATED_PATCH | TRANSDERMAL | 0 refills | Status: DC

## 2017-04-13 MED ORDER — ACETAMINOPHEN 500 MG PO TABS: ORAL_TABLET | ORAL | 0 refills | Status: DC

## 2017-04-13 MED ORDER — POLYETHYLENE GLYCOL 3350 17 G PO PACK: PACK | ORAL | 0 refills | Status: DC

## 2017-04-13 MED ORDER — DOXYCYCLINE HYCLATE 100 MG PO TABS
100.0000 mg | ORAL_TABLET | Freq: Two times a day (BID) | ORAL | Status: DC
  Administered 2017-04-13: 100 mg via ORAL
  Filled 2017-04-13: qty 1

## 2017-04-13 MED ORDER — DIPHENHYDRAMINE-APAP (SLEEP) 25-500 MG PO TABS: ORAL_TABLET | ORAL | Status: DC

## 2017-04-13 NOTE — Discharge Summary (Signed)
Physician Discharge Summary  Patient ID: Eduardo [name] MRN: [id number] DOB/AGE: [birth year] 48 y.o.  Admit date: 04/10/2017 Discharge date: 04/13/2017  Admission Diagnoses:  Cellulitis of the back Body mass index is 38.34 kg/m. TOBACCO ABUSE Chronic pilonidal disease Generalized anxiety disorder History of MRSA infection    Discharge Diagnoses:  Cellulitis of the back   Infected back sebaceous cyst with chronic blackheads Chronic pilonidal disease Body mass index is 38.34 kg/m. TOBACCO ABUSE Chronic pilonidal disease Generalized anxiety disorder History of MRSA infection    Principal Problem:   Cellulitis and abscess of trunk s/p I&D 04/10/2017 Active Problems:   OBESITY   TOBACCO ABUSE   Chronic pilonidal disease   Generalized anxiety disorder   History of MRSA infection   Chronic folliculitis   Blackhead   PROCEDURES: IRRIGATION AND DEBRIDEMENT BACK ABSCESS, Unroofing pilonidal disease, 04/10/17, Dr. Cyndia Bent Course:  46 year old smoking obese male.  History of MRSA with pilonidal abscess drainage and other small subcutaneous infections.  Recently.  However patient developed pain and swelling on his back.  Presumed to be abscess.  Started on oral antibiotics four days ago.  Worsened.  Went to [facility].  Swelling and pain on his back.  A 20 x 10 cm area of inflammation and induration with central fluctuance and erythema.  Suspicious for large abscess.  ER staff uncomfortable with attempting any incision and drainage.  Therefore, Surgical consultation requested.  Patient transferred to from [facility] for evaluation at [facility].  Wife in room.  Patient has history of diabetes.  Neuro cardiac or pulmonary issues.  Usually decent exercise tolerance.  History of pilonidal abscess drained a long time ago.  However it still intermittently drains.  He wonders if that is the problem in that the wound ever closes any get  abscesses elsewhere.  He does not feel pain on his tailbone and does not think he has a pilonidal abscess today. He was seen in the ED, admitted and taken to the OR on 04/10/17. Post op he has been going thru dressing changes and showers.  Culture show   Culture ABUNDANT METHICILLIN RESISTANT STAPHYLOCOCCUS AUREUS  NO ANAEROBES ISOLATED; CULTURE IN PROGRESS FOR 5 DAYS      Report Status PENDING   Organism ID, Bacteria METHICILLIN RESISTANT STAPHYLOCOCCUS AUREUS   Resulting Agency [facility]  Susceptibility    Methicillin resistant staphylococcus aureus    MIC    CIPROFLOXACIN >=8 RESISTANT  Resistant    CLINDAMYCIN <=0.25 SENSITIVE "><=0.25 SENS... Sensitive    ERYTHROMYCIN >=8 RESISTANT  Resistant    GENTAMICIN <=0.5 SENSITIVE "><=0.5 SENSI... Sensitive    Inducible Clindamycin NEGATIVE  Sensitive    OXACILLIN >=4 RESISTANT  Resistant    RIFAMPIN <=0.5 SENSITIVE "><=0.5 SENSI... Sensitive    TETRACYCLINE <=1 SENSITIVE "><=1 SENSITIVE  Sensitive    TRIMETH/SULFA <=10 SENSITIVE "><=10 SENSIT... Sensitive    VANCOMYCIN 1 SENSITIVE  Sensitive         Susceptibility Comments   Methicillin resistant staphylococcus aureus  ABUNDANT METHICILLIN RESISTANT STAPHYLOCOCCUS AUREUS     He was on a course of Doxycycline started on 6/20, and I have just continue that and will let him finish out what he already has at home.  He is to do wet to dry dressing at least BID, he can shower with the wound open and Dr. Zella Richer recommended Hibiclen's baths for the next week.   He will follow up with Dr. Johney Maine next week.  I  gave him the week off and until follow up with Dr. Johney Maine for work.    CBC Latest Ref Rng & Units 04/13/2017 04/12/2017 04/10/2017  WBC 4.0 - 10.5 K/uL 12.2(H) 17.9(H) 12.3(H)  Hemoglobin 13.0 - 17.0 g/dL 13.4 13.7 14.2  Hematocrit 39.0 - 52.0 % 38.8(L) 38.6(L) 39.0  Platelets 150 - 400 K/uL 240 248 214     CMP Latest Ref Rng & Units 04/13/2017 04/12/2017 04/10/2017  Glucose 65 - 99  mg/dL 124(H) 122(H) 81  BUN 6 - 20 mg/dL 15 18 18   Creatinine 0.61 - 1.24 mg/dL 0.90 1.10 1.17  Sodium 135 - 145 mmol/L 141 138 136  Potassium 3.5 - 5.1 mmol/L 4.2 4.3 4.2  Chloride 101 - 111 mmol/L 107 103 101  CO2 22 - 32 mmol/L 27 27 26   Calcium 8.9 - 10.3 mg/dL 8.4(L) 8.9 8.8(L)  Total Protein 6.0 - 8.5 g/dL - - -  Total Bilirubin 0.0 - 1.2 mg/dL - - -  Alkaline Phos 39 - 117 IU/L - - -  AST 0 - 40 IU/L - - -  ALT 0 - 44 IU/L - - -    Condition on discharge:  Improved  Disposition: 01-Home or Self Care   Allergies as of 04/13/2017   No Known Allergies     Medication List    TAKE these medications   acetaminophen 500 MG tablet Commonly known as:  TYLENOL Take 1000 mg every 8 hours as long as you need pain medicine.   amLODipine 2.5 MG tablet Commonly known as:  NORVASC Take 1 tablet (2.5 mg total) by mouth daily.   chlorhexidine 4 % external liquid Commonly known as:  HIBICLENS Use this for bathing for the next 7 days then you can go back to regular soap and water.  You can buy this over the counter also.   diphenhydramine-acetaminophen 25-500 MG Tabs tablet Commonly known as:  TYLENOL PM You can use this at bedtime, instead of the regular Tylenol.    DO NOT TAKE MORE THAN 4000 MG OF TYLENOL PER DAY.  IT CAN HARM YOUR LIVER. What changed:  how much to take  how to take this  when to take this  reasons to take this  additional instructions   doxycycline 100 MG tablet Commonly known as:  VIBRA-TABS Take 100 mg by mouth 2 (two) times daily.   multivitamin with minerals tablet Take 1 tablet by mouth daily.   nicotine 21 mg/24hr patch Commonly known as:  NICODERM CQ - dosed in mg/24 hours You can buy this over the counter.  It will help you to heal if you stop smoking.   oxyCODONE 5 MG immediate release tablet Commonly known as:  Oxy IR/ROXICODONE Take 1-2 tablets (5-10 mg total) by mouth every 4 (four) hours as needed for moderate pain.    polyethylene glycol packet Commonly known as:  MIRALAX / GLYCOLAX You can buy this over the counter and use as needed for constipation.   pregabalin 300 MG capsule Commonly known as:  LYRICA Take 1 capsule (300 mg total) by mouth 2 (two) times daily.   saccharomyces boulardii 250 MG capsule Commonly known as:  FLORASTOR This is a probiotic, it is to help you not develop colitis while you are on antibiotics.  You can get this at any Drug store, over the counter without a prescription.  It will be with the stuff for constipation.  Use it for the next month till completed.  Follow package instructions.  Follow-up Information    [name], MD. Call on 04/20/2017.   Specialty:  General Surgery Why:  Your appointment is at 8:30 AM. Please arrive 30 min prior to appointment time to check in and fill out forms. Bring photo ID and insurance information.  Contact information: [place] [address] [phone]        [name], NP Follow up.   Specialty:  Family Medicine Why:  Call and let them know you have been in the hospital and follow up on Medical issues. Contact information: [place] [address] [phone]           Signed: Earnstine Regal 04/13/2017, 3:23 PM

## 2017-04-13 NOTE — Discharge Instructions (Addendum)
WOUND CARE Shower and change dressing at least twice a day until you can no longer pack gauze into the space.   You can do it more frequently if you need to.    It is important that the wound be kept open.   -Keeping the skin edges apart will allow the wound to gradually heal from the base upwards.   - If the skin edges of the wound close too early, a new fluid pocket can form and infection can occur. -This is the reason to pack deeper wounds with gauze or ribbon -This is why drained wounds cannot be sewed closed right away  A healthy wound should form a lining of bright red "beefy" granulating tissue that will help shrink the wound and help the edges grow new skin into it.   -A little mucus / yellow discharge is normal (the body's natural way to try and form a scab) and should be gently washed off with soap and water with daily dressing changes.  -Green or foul smelling drainage implies bacterial colonization and can slow wound healing - a short course of antibiotic ointment (3-5 days) can help it clear up.  Call the doctor if it does not improve or worsens  -Avoid use of antibiotic ointments for more than a week as they can slow wound healing over time.    -Sometimes other wound care products will be used to reduce need for dressing changes and/or help clean up dirty wounds -Sometimes the surgeon needs to debride the wound in the office to remove dead or infected tissue out of the wound so it can heal more quickly and safely.    Change the dressing at least once a day -Wash the wound with mild soap and water gently every day.  It is good to shower or bathe the wound to help it clean out. -Use clean 4x4 gauze for medium/large wounds (it does not need to be sterile, just clean) -Keep the raw wound moist with a little saline or KY (saline) gel on the gauze.  -A dry wound will take longer to heal.  -Keep the skin dry around the wound to prevent breakdown and irritation. -Pack the wound down to  the base -The goal is to keep the skin apart, not overpack the wound -Use a Q-tip or blunt-tipped kabob stick toothpick to push the gauze down to the base in narrow or deep wounds   -Cover with a clean gauze and tape -paper or Medipore tape tend to be gentle on the skin -rotate the orientation of the tape to avoid repeated stress/trauma on the skin -using an ACE or Coban wrap on wounds on arms or legs can be used instead.  Complete all antibiotics through the entire prescription to help the infection heal and prevent new places of infection   Returning the see the surgeon is helpful to follow the healing process and help the wound close as fast as possible.   Pilonidal Cyst A pilonidal cyst is a fluid-filled sac. It forms beneath the skin near your tailbone, at the top of the crease of your buttocks. A pilonidal cyst that is not large or infected may not cause symptoms or problems. If the cyst becomes irritated or infected, it may fill with pus. This causes pain and swelling (pilonidal abscess). An infected cyst may need to be treated with medicine, drained, or removed. What are the causes? The cause of a pilonidal cyst is not known. One cause may be a hair that  grows into your skin (ingrown hair). What increases the risk? Pilonidal cysts are more common in boys and men. Risk factors include:  Having lots of hair near the crease of the buttocks.  Being overweight.  Having a pilonidal dimple.  Wearing tight clothing.  Not bathing or showering frequently.  Sitting for long periods of time.  What are the signs or symptoms? Signs and symptoms of a pilonidal cyst may include:  Redness.  Pain and tenderness.  Warmth.  Swelling.  Pus.  Fever.  How is this diagnosed? Your health care provider may diagnose a pilonidal cyst based on your symptoms and a physical exam. The health care provider may do a blood test to check for infection. If your cyst is draining pus, your health  care provider may take a sample of the drainage to be tested at a laboratory. How is this treated? Surgery is the usual treatment for an infected pilonidal cyst. You may also have to take medicines before surgery. The type of surgery you have depends on the size and severity of the infected cyst. The different kinds of surgery include:  Incision and drainage. This is a procedure to open and drain the cyst.  Marsupialization. In this procedure, a large cyst or abscess may be opened and kept open by stitching the edges of the skin to the cyst walls.  Cyst removal. This procedure involves opening the skin and removing all or part of the cyst.  Follow these instructions at home:  Follow all of your surgeons instructions carefully if you had surgery.  Take medicines only as directed by your health care provider.  If you were prescribed an antibiotic medicine, finish it all even if you start to feel better.  Keep the area around your pilonidal cyst clean and dry.  Clean the area as directed by your health care provider. Pat the area dry with a clean towel. Do not rub it as this may cause bleeding.  Remove hair from the area around the cyst as directed by your health care provider.  Do not wear tight clothing or sit in one place for long periods of time.  There are many different ways to close and cover an incision, including stitches, skin glue, and adhesive strips. Follow your health care provider's instructions on: ? Incision care. ? Bandage (dressing) changes and removal. ? Incision closure removal. Contact a health care provider if:  You have drainage, redness, swelling, or pain at the site of the cyst.  You have a fever. This information is not intended to replace advice given to you by your health care provider. Make sure you discuss any questions you have with your health care provider. Document Released: 10/02/2000 Document Revised: 03/12/2016 Document Reviewed:  02/22/2014 Elsevier Interactive Patient Education  2018 [place]: POST OP INSTRUCTIONS  ######################################################################  EAT Gradually transition to a high fiber diet with a fiber supplement over the next few weeks after discharge.  Start with a pureed / full liquid diet (see below)  WALK Walk an hour a day.  Control your pain to do that.    CONTROL PAIN Control pain so that you can walk, sleep, tolerate sneezing/coughing, go up/down stairs.  HAVE A BOWEL MOVEMENT DAILY Keep your bowels regular to avoid problems.  OK to try a laxative to override constipation.  OK to use an antidairrheal to slow down diarrhea.  Call if not better after 2 tries  CALL IF YOU HAVE PROBLEMS/CONCERNS Call if you are  still struggling despite following these instructions. Call if you have concerns not answered by these instructions  ######################################################################    1. DIET: Follow a light bland diet the first 24 hours after arrival home, such as soup, liquids, crackers, etc.  Be sure to include lots of fluids daily.  Avoid fast food or heavy meals as your are more likely to get nauseated.   2. Take your usually prescribed home medications unless otherwise directed. 3. PAIN CONTROL: a. Pain is best controlled by a usual combination of three different methods TOGETHER: i. Ice/Heat ii. Over the counter pain medication iii. Prescription pain medication b. Most patients will experience some swelling and bruising around the incisions.  Ice packs or heating pads (30-60 minutes up to 6 times a day) will help. Use ice for the first few days to help decrease swelling and bruising, then switch to heat to help relax tight/sore spots and speed recovery.  Some people prefer to use ice alone, heat alone, alternating between ice & heat.  Experiment to what works for you.  Swelling and bruising can take several weeks to  resolve.   c. It is helpful to take an over-the-counter pain medication regularly for the first few weeks.  Choose one of the following that works best for you: i. Naproxen (Aleve, etc)  Two 220mg  tabs twice a day ii. Ibuprofen (Advil, etc) Three 200mg  tabs four times a day (every meal & bedtime) iii. Acetaminophen (Tylenol, etc) 500-650mg  four times a day (every meal & bedtime) d. A  prescription for pain medication (such as oxycodone, hydrocodone, etc) should be given to you upon discharge.  Take your pain medication as prescribed.  i. If you are having problems/concerns with the prescription medicine (does not control pain, nausea, vomiting, rash, itching, etc), please call us [phone] to see if we need to switch you to a different pain medicine that will work better for you and/or control your side effect better. ii. If you need a refill on your pain medication, please contact your pharmacy.  They will contact our office to request authorization. Prescriptions will not be filled after 5 pm or on week-ends. 4. Avoid getting constipated.  Between the surgery and the pain medications, it is common to experience some constipation.  Increasing fluid intake and taking a fiber supplement (such as Metamucil, Citrucel, FiberCon, MiraLax, etc) 1-2 times a day regularly will usually help prevent this problem from occurring.  A mild laxative (prune juice, Milk of Magnesia, MiraLax, etc) should be taken according to package directions if there are no bowel movements after 48 hours.    5. Wash / shower every day.  You may shower over the dressings as they are waterproof.  Continue to shower over incision(s) after the dressing is off.  6. Remove your outside bandages the next day after surgery.  If your incision was left open with a wound, see the separate "Wound Care Instructions."  If your incision ois closed, you may leave most of your incision open to air.  You will most likely skin tapes (Steri Strips)  covering the incision(s).  Leave them on until one week, then remove.  You most likely have small ribbon wicks next to the upper part of your incision that will drain fluid (usually thinly bloody).  Replace a dressing/Band-Aid to cover the wicks that drain 1-2/day.  Keep the area clean & dry.      7. ACTIVITIES as tolerated:   a. You may resume regular (light) daily  activities beginning the next day--such as daily self-care, walking, climbing stairs--gradually increasing activities as tolerated.  If you can walk 30 minutes without difficulty, it is safe to try more intense activity such as jogging, treadmill, bicycling, low-impact aerobics, swimming, etc. b. Save the most intensive and strenuous activity for last such as sit-ups, heavy lifting, contact sports, etc  Refrain from any heavy lifting or straining until you are off narcotics for pain control.   c. DO NOT PUSH THROUGH PAIN.  Let pain be your guide: If it hurts to do something, don't do it.  Pain is your body warning you to avoid that activity for another week until the pain goes down. d. You may drive when you are no longer taking prescription pain medication, you can comfortably wear a seatbelt, and you can safely maneuver your car and apply brakes. e. Dennis Bast may have sexual intercourse when it is comfortable.  8. FOLLOW UP in our office a. Please call [facility] at [phone] to set up an appointment to see your surgeon in the office for a follow-up appointment approximately 7-10 days after your surgery to have one of your ribbon wicks removed by the office nurse.  The other remaining wick usually is removed the next week after that when you meet your surgeon again for a post-operative visit. b. Make sure that you call for these appointments the day you arrive home to ensure a convenient appointment time. 9. IF YOU HAVE DISABILITY OR FAMILY LEAVE FORMS, BRING THEM TO THE OFFICE FOR PROCESSING.  DO NOT GIVE THEM TO YOUR DOCTOR.   WHEN TO CALL  us [phone]: 1. Poor pain control 2. Reactions / problems with new medications (rash/itching, nausea, etc)  3. Fever over 101.5 F (38.5 C) 4. Worsening swelling or bruising 5. Continued bleeding from incision. 6. Increased pain, redness, or drainage from the incision 7. Difficulty breathing / swallowing   The clinic staff is available to answer your questions during regular business hours (8:30am-5pm).  Please dont hesitate to call and ask to speak to one of our nurses for clinical concerns.   If you have a medical emergency, go to the nearest emergency room or call 911.  A surgeon from [facility] is always on call at the hospitals   Decontamination procedure:  After the wound has completely healed, shower with Hibiclens liquid soap (full body scrub) daily for one week.  Any intimate contacts should do the same.  Decontaminate all furniture with spray.  Wipe down other surfaces with Clorox wipes.  Wash all clothing and sheets in hot water.   [facility], [place], [place], [place], [place]  [address] ? MAIN: [phone] ? TOLL FREE: [phone] ?  FAX [phone] [email or web address]

## 2017-04-13 NOTE — Progress Notes (Signed)
Pt was discharged home today. Instructions were reviewed with patient, and questions were answered. Pt was taken to main entrance via wheelchair by NT.  

## 2017-04-13 NOTE — Progress Notes (Signed)
3 Days Post-Op    CC:  Back abscess  Subjective: Asking about disability, I have got him an appointment on 04/20/17, and pending results of visit this can be discussed.    Objective: Vital signs in last 24 hours: Temp:  [97.6 F (36.4 C)-98.2 F (36.8 C)] 97.6 F (36.4 C) (06/26 0525) Pulse Rate:  [78-85] 78 (06/26 0525) Resp:  [18] 18 (06/26 0525) BP: (119-145)/(77-97) 119/89 (06/26 0525) SpO2:  [98 %-99 %] 99 % (06/26 0525) Last BM Date: 04/10/17  Intake/Output from previous day: 06/25 0701 - 06/26 0700 In: 1660 [P.O.:720; IV Piggyback:940] Out: 4500 [Urine:4500] Intake/Output this shift: Total I/O In: 170 [P.O.:120; IV Piggyback:50] Out: 100 [Urine:100]  General appearance: alert, cooperative and no distress Incision/Wound:  Open wound is clean but still draining purulent material.  Will put him in the shower and redress before discharge.  Lab Results:   Recent Labs  04/12/17 0456 04/13/17 0510  WBC 17.9* 12.2*  HGB 13.7 13.4  HCT 38.6* 38.8*  PLT 248 240    BMET  Recent Labs  04/12/17 1038 04/13/17 0510  NA 138 141  K 4.3 4.2  CL 103 107  CO2 27 27  GLUCOSE 122* 124*  BUN 18 15  CREATININE 1.10 0.90  CALCIUM 8.9 8.4*   PT/INR No results for input(s): LABPROT, INR in the last 72 hours.  No results for input(s): AST, ALT, ALKPHOS, BILITOT, PROT, ALBUMIN in the last 168 hours.   Lipase     Component Value Date/Time   LIPASE 56 12/22/2012 0754     Prior to Admission medications   Medication Sig Start Date End Date Taking? Authorizing Provider  amLODipine (NORVASC) 2.5 MG tablet Take 1 tablet (2.5 mg total) by mouth daily. 04/01/17  Yes Danford, Valetta Fuller D, NP  diphenhydramine-acetaminophen (TYLENOL PM) 25-500 MG TABS tablet Take 2 tablets by mouth at bedtime as needed.   Yes [provider]  doxycycline (VIBRA-TABS) 100 MG tablet Take 100 mg by mouth 2 (two) times daily. 04/07/17  Yes [provider]  Multiple Vitamins-Minerals  (MULTIVITAMIN WITH MINERALS) tablet Take 1 tablet by mouth daily.   Yes [provider]  pregabalin (LYRICA) 300 MG capsule Take 1 capsule (300 mg total) by mouth 2 (two) times daily. 12/30/16  Yes Melvenia Beam, MD    Medications: . acetaminophen  1,000 mg Oral Q8H  . amLODipine  2.5 mg Oral Daily  . enoxaparin (LOVENOX) injection  40 mg Subcutaneous Daily  . lip balm  1 application Topical BID  . multivitamin with minerals  1 tablet Oral Daily  . nicotine  21 mg Transdermal Daily  . pneumococcal 23 valent vaccine  0.5 mL Intramuscular Tomorrow-1000  . polyethylene glycol  17 g Oral Daily  . pregabalin  300 mg Oral BID  . sodium chloride flush  3 mL Intravenous Q12H   . sodium chloride    . piperacillin-tazobactam (ZOSYN)  IV 3.375 g (04/13/17 0921)  . vancomycin 1,250 mg (04/13/17 1154)    Assessment/Plan   Cellulitis and abscess of back s/p I&D 04/10/2017-abundant staph aureus-culture still in progress but suspect this is MRSA; area of induration redness lateral to open wound is better since Vancomycin started Active Problems:   Body mass index is 38.34 kg/m.   TOBACCO ABUSE   Chronic pilonidal disease   Generalized anxiety disorder   History of MRSA infection FEN:  Regular diet ID: day 4 Zosyn, day 2 Vancomycin  --  METHICILLIN RESISTANT STAPHYLOCOCCUS AUREUS  DVT:  Lovenox  Pt culture came back with MRSA  Plan 13 more days of MRSA with Doxycycline 100 mg BID. Pain Meds:  Tylenol 1 gm TID, Dilaudid 3 mg yesterday and 1 mg today, Robaxin 500mg , Oxycodone 5-10 q4h 15 mg yesterday and 20 mg today. He is on Lyrica for chronic LE neuropathy   I have personally reviewed the patients medication history on the [place] controlled substance database.      LOS: 2 days    Eduardo [name] 04/13/2017 [phone]

## 2017-04-13 NOTE — Progress Notes (Signed)
He was on a course of Doxycycline started on 04/07/17 he was admitted on 6/23, so I have instructed him just to complete the current course of Doxycycline at home as previously instructed.

## 2017-04-13 NOTE — Progress Notes (Signed)
Assessment Principal Problem:   Cellulitis and abscess of back s/p I&D 04/10/2017-abundant staph aureus-culture still in progress but suspect this is MRSA; area of induration redness lateral to open wound is better since Vancomycin started Active Problems:   OBESITY   TOBACCO ABUSE   Chronic pilonidal disease   Generalized anxiety disorder   History of MRSA infection    Plan:  Check CBC-in WBC better, may discharge this afternoon on oral abxs.  We discussed MRSA decontamination procedure after wounds heal.  HHRN for dressing changes.   LOS: 2 days     3 Days Post-Op  Chief Complaint/Subjective: Sore around wounds.  Objective: Vital signs in last 24 hours: Temp:  [97.6 F (36.4 C)-98.2 F (36.8 C)] 97.6 F (36.4 C) (06/26 0525) Pulse Rate:  [78-85] 78 (06/26 0525) Resp:  [18] 18 (06/26 0525) BP: (119-145)/(74-97) 119/89 (06/26 0525) SpO2:  [98 %-99 %] 99 % (06/26 0525) Last BM Date: 04/10/17  Intake/Output from previous day: 06/25 0701 - 06/26 0700 In: 1660 [P.O.:720; IV Piggyback:940] Out: 4500 [Urine:4500] Intake/Output this shift: No intake/output data recorded.  PE: General- In NAD.  Awake and alert. Back-open wound is clean internally with no purulent drainage; less redness and induration lateral to area(marked); other small wounds are clean.  Lab Results:   Recent Labs  04/10/17 1416 04/12/17 0456  WBC 12.3* 17.9*  HGB 14.2 13.7  HCT 39.0 38.6*  PLT 214 248   BMET  Recent Labs  04/12/17 1038 04/13/17 0510  NA 138 141  K 4.3 4.2  CL 103 107  CO2 27 27  GLUCOSE 122* 124*  BUN 18 15  CREATININE 1.10 0.90  CALCIUM 8.9 8.4*   PT/INR No results for input(s): LABPROT, INR in the last 72 hours. Comprehensive Metabolic Panel:    Component Value Date/Time   NA 141 04/13/2017 0510   NA 138 04/12/2017 1038   NA 138 04/06/2017 1020   NA 141 12/30/2016 0933   K 4.2 04/13/2017 0510   K 4.3 04/12/2017 1038   CL 107 04/13/2017 0510   CL 103  04/12/2017 1038   CO2 27 04/13/2017 0510   CO2 27 04/12/2017 1038   BUN 15 04/13/2017 0510   BUN 18 04/12/2017 1038   BUN 13 04/06/2017 1020   BUN 14 12/30/2016 0933   CREATININE 0.90 04/13/2017 0510   CREATININE 1.10 04/12/2017 1038   CREATININE 1.12 10/25/2014 1124   GLUCOSE 124 (H) 04/13/2017 0510   GLUCOSE 122 (H) 04/12/2017 1038   CALCIUM 8.4 (L) 04/13/2017 0510   CALCIUM 8.9 04/12/2017 1038   AST 11 04/06/2017 1020   AST 17 12/30/2016 0933   ALT 18 04/06/2017 1020   ALT 23 12/30/2016 0933   ALKPHOS 105 04/06/2017 1020   ALKPHOS 100 12/30/2016 0933   BILITOT 1.1 04/06/2017 1020   BILITOT 0.5 12/30/2016 0933   PROT 7.5 04/06/2017 1020   PROT 7.3 12/30/2016 0933   ALBUMIN 4.4 04/06/2017 1020   ALBUMIN 4.5 12/30/2016 0933     Studies/Results: No results found.  Anti-infectives: Anti-infectives    Start     Dose/Rate Route Frequency Ordered Stop   04/13/17 0000  vancomycin (VANCOCIN) 1,250 mg in sodium chloride 0.9 % 250 mL IVPB     1,250 mg 166.7 mL/hr over 90 Minutes Intravenous Every 12 hours 04/12/17 1040     04/12/17 1200  vancomycin (VANCOCIN) 2,500 mg in sodium chloride 0.9 % 500 mL IVPB     2,500 mg 250 mL/hr over  120 Minutes Intravenous  Once 04/12/17 1040 04/12/17 1600   04/11/17 1000  piperacillin-tazobactam (ZOSYN) IVPB 3.375 g     3.375 g 12.5 mL/hr over 240 Minutes Intravenous Every 8 hours 04/11/17 0852     04/10/17 2330  doxycycline (VIBRA-TABS) tablet 100 mg  Status:  Discontinued     100 mg Oral 2 times daily 04/10/17 2322 04/11/17 0835   04/10/17 1900  vancomycin (VANCOCIN) 1,500 mg in sodium chloride 0.9 % 500 mL IVPB     1,500 mg 250 mL/hr over 120 Minutes Intravenous On call to O.R. 04/10/17 1737 04/10/17 2310   04/10/17 1900  ceFAZolin (ANCEF) 3 g in dextrose 5 % 50 mL IVPB     3 g 130 mL/hr over 30 Minutes Intravenous On call to O.R. 04/10/17 1737 04/10/17 2110       Eduardo [name] J 04/13/2017

## 2017-04-13 NOTE — Progress Notes (Signed)
Spoke with [facility] [phone], intake number [id number]. A home health agency will call the pt at home. Pt's telephone number was verified with pt.

## 2017-04-15 ENCOUNTER — Ambulatory Visit: Payer: Self-pay | Admitting: Adult Health

## 2017-04-15 LAB — AEROBIC/ANAEROBIC CULTURE (SURGICAL/DEEP WOUND)

## 2017-04-15 LAB — AEROBIC/ANAEROBIC CULTURE W GRAM STAIN (SURGICAL/DEEP WOUND)

## 2017-04-19 ENCOUNTER — Encounter: Payer: Self-pay | Admitting: Adult Health

## 2017-04-19 ENCOUNTER — Ambulatory Visit (INDEPENDENT_AMBULATORY_CARE_PROVIDER_SITE_OTHER): Payer: Managed Care, Other (non HMO) | Admitting: Adult Health

## 2017-04-19 VITALS — BP 132/84 | HR 93 | Wt 329.0 lb

## 2017-04-19 DIAGNOSIS — F17200 Nicotine dependence, unspecified, uncomplicated: Secondary | ICD-10-CM

## 2017-04-19 DIAGNOSIS — L02219 Cutaneous abscess of trunk, unspecified: Secondary | ICD-10-CM | POA: Diagnosis not present

## 2017-04-19 DIAGNOSIS — Z8614 Personal history of Methicillin resistant Staphylococcus aureus infection: Secondary | ICD-10-CM

## 2017-04-19 DIAGNOSIS — L03319 Cellulitis of trunk, unspecified: Secondary | ICD-10-CM | POA: Diagnosis not present

## 2017-04-19 MED ORDER — NICOTINE 14 MG/24HR TD PT24: 14.0000 mg | MEDICATED_PATCH | Freq: Every day | TRANSDERMAL | 2 refills | Status: DC

## 2017-04-19 NOTE — Patient Instructions (Addendum)

## 2017-04-19 NOTE — Assessment & Plan Note (Signed)
Failed Chantix and Wellbutrin. Nicoderm patch worked during hospitalization-rx sent in.

## 2017-04-19 NOTE — Progress Notes (Signed)
Subjective:    Patient ID: Eduardo [name], male    DOB: [birth year], 46 y.o.   MRN: [id number]  HPI:  [name] is here for f/u  ED notes 04/10/17: 46 year old smoking obese male.  History of MRSA with pilonidal abscess drainage and other small subcutaneous infections.  Recently.  However patient developed pain and swelling on his back.  Presumed to be abscess.  Started on oral antibiotics four days ago.  Worsened.  W[facility] to [facility].  Swelling and pain on his back.  A 20 x 10 cm area of inflammation and induration with central fluctuance and erythema.  Suspicious for large abscess.  ER staff uncomfortable with attempting any incision and drainage.  Therefore, Surgical consultation requested.  Patient transferred to from [facility] for evaluation at [facility].  Wife in room.  Patient has history of diabetes.  Neuro cardiac or pulmonary issues.  Usually decent exercise tolerance.  History of pilonidal abscess drained a long time ago.  However it still intermittently drains.  He wonders if that is the problem in that the wound ever closes any get abscesses elsewhere.  He does not feel pain on his tailbone and does not think he has a pilonidal abscess today. D/C summary: He was on a course of Doxycycline started on 6/20, and I have just continue that and will let him finish out what he already has at home.  He is to do wet to dry dressing at least BID, he can shower with the wound open and Dr. Zella Richer recommended [place]'s baths for the next week.   He will follow up with Dr. Johney Maine next week.  I gave him the week off and until follow up with Dr. Johney Maine for work.    He presents today for f/u, wound check.  He continues to have fever/night sweats/constant diaphoresis (however he states "I sweat all the time anyway"). He has been taking Doxy and narcotics as directed.  He continues to smoke, estimates a pack/day.  He has failed Chantix and Wellbutrin in past.  He was on  Nicoderm patches in the hospital which worked. His sister is a Therapist, sports and has been changing dressing BID-wet to dry.  He reports constant dull pain at site, 8/10 that increases with dressing change/palpitation.  Patient Care Team    Relationship Specialty Notifications Start End  Mina Marble D, NP PCP - General Family Medicine  03/03/17   Allyn Kenner, MD  Dermatology  04/06/17     Patient Active Problem List   Diagnosis Date Noted  . Chronic folliculitis 00/17/4944  . [place] 04/10/2017  . Cellulitis and abscess of trunk s/p I&D 04/10/2017 04/06/2017  . Fever chills 04/06/2017  . History of MRSA infection 04/06/2017  . Health care maintenance 03/03/2017  . Neuropathy 09/28/2015  . Major depression 12/26/2012  . Generalized anxiety disorder 12/26/2012  . Opiate dependence ([place]) 12/24/2012  . Benzodiazepine dependence ([place]) 12/24/2012  . HTN (hypertension) 12/24/2012  . 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 OBESITY 09/09/2010  . TOBACCO ABUSE 09/09/2010  . LOW BACK PAIN, CHRONIC 09/09/2010     Past Medical History:  Diagnosis Date  . Anxiety   . Chronic back pain   . Degenerative disk disease   . Depression   . Hypertension   . MRSA (methicillin resistant staph aureus) culture positive   . Peripheral neuropathy   . Pilonidal cyst      Past Surgical History:  Procedure Laterality Date  .  PILONIDAL CYST DRAINAGE [name] 04/10/2017   Procedure: IRRIGATION AND DEBRIDEMENT BACK ABSCESS;  Surgeon: [name], MD;  Location: [facility]S;  Service: General;  Laterality: [name];  . THORACIC OUTLET SURGERY  2000  . WRIST FUSION  2002     Family History  Problem Relation Age of Onset  . Cancer Maternal Grandmother        breast, ovarian & colon  . Hypertension Mother   . Diabetes Mother   . Hypertension Father   . Heart disease Father   . Stroke Paternal Grandfather   . Neuropathy Neg Hx      History  Drug Use No     History  Alcohol Use No    Comment: Rarely      History  Smoking Status  . Current Every Day Smoker  . Packs/day: 1.00  . Years: 15.00  . Types: Cigarettes  Smokeless Tobacco  . Never Used     Outpatient Encounter Prescriptions as of 04/19/2017  Medication Sig Note  . acetaminophen (TYLENOL) 500 MG tablet Take 1000 mg every 8 hours as long as you need pain medicine.   [name] amLODipine (NORVASC) 2.5 MG tablet Take 1 tablet (2.5 mg total) by mouth daily.   . chlorhexidine (HIBICLENS) 4 % external liquid Use this for bathing for the next 7 days then you can go back to regular soap and water.  You can buy this over the counter also.   . diphenhydramine-acetaminophen (TYLENOL PM) 25-500 MG TABS tablet You can use this at bedtime, instead of the regular Tylenol.    DO NOT TAKE MORE THAN 4000 MG OF TYLENOL PER DAY.  IT CAN HARM YOUR LIVER.   [name] doxycycline (VIBRA-TABS) 100 MG tablet Take 100 mg by mouth 2 (two) times daily. 04/10/2017: Started on 04/07/17 for a 21 day supply   . Multiple Vitamins-Minerals (MULTIVITAMIN WITH MINERALS) tablet Take 1 tablet by mouth daily.   . nicotine (NICODERM CQ - DOSED IN MG/24 HOURS) 21 mg/24hr patch You can buy this over the counter.  It will help you to heal if you stop smoking.   [name] oxyCODONE (OXY IR/ROXICODONE) 5 MG immediate release tablet Take 1-2 tablets (5-10 mg total) by mouth every 4 (four) hours as needed for moderate pain.   . polyethylene glycol (MIRALAX / GLYCOLAX) packet You can buy this over the counter and use as needed for constipation.   . pregabalin (LYRICA) 300 MG capsule Take 1 capsule (300 mg total) by mouth 2 (two) times daily.   [name] saccharomyces boulardii (FLORASTOR) 250 MG capsule This is a probiotic, it is to help you not develop colitis while you are on antibiotics.  You can get this at any Drug store, over the counter without a prescription.  It will be with the stuff for constipation.  Use it for the next month till completed.  Follow package instructions.   . nicotine (NICODERM CQ  - DOSED IN MG/24 HOURS) 14 mg/24hr patch Place 1 patch (14 mg total) onto the skin daily.    No facility-administered encounter medications on file as of 04/19/2017.     Allergies: Patient has no known allergies.  Body mass index is 40.05 kg/m.  Blood pressure 132/84, pulse 93, weight (!) 329 lb (149.2 kg).   Review of Systems  Constitutional: Positive for activity change, chills, diaphoresis, fatigue and fever. Negative for appetite change and unexpected weight change.  Respiratory: Negative for cough, chest tightness, shortness of breath, wheezing and stridor.   Cardiovascular:  Negative for chest pain, palpitations and leg swelling.  Gastrointestinal: Negative for abdominal distention, abdominal pain, blood in stool, constipation, diarrhea, nausea and vomiting.  Endocrine: Negative for cold intolerance, heat intolerance, polydipsia, polyphagia and polyuria.  Genitourinary: Negative for difficulty urinating, flank pain and hematuria.  Musculoskeletal: Positive for arthralgias, back pain, gait problem and myalgias.  Skin: Positive for wound. Negative for color change, pallor and rash.  Neurological: Negative for dizziness and headaches.  Hematological: Does not bruise/bleed easily.       Objective:   Physical Exam  Constitutional: He appears well-developed and well-nourished. No distress.  HENT:  Head: Normocephalic and atraumatic.  Cardiovascular: Normal rate, regular rhythm, normal heart sounds and intact distal pulses.   No murmur heard. Pulmonary/Chest: Effort normal and breath sounds normal. No respiratory distress. He has no wheezes. He has no rales. He exhibits no tenderness.  Abdominal: Soft. Bowel sounds are normal. He exhibits no distension.  Skin: Skin is warm. No rash noted. He is diaphoretic. There is erythema. No pallor.     12 cm linear open surgical wound with packing. Edges reddened with drainage. Site warm to the touch, however no streaking noted. Lower left  of surgical site, 32m palpable, mobile mass noted.   Psychiatric: He has a normal mood and affect. His behavior is normal. Judgment and thought content normal.  Nursing note and vitals reviewed.         Assessment & Plan:   1. History of MRSA infection   2. Cellulitis and abscess of trunk s/p I&D 04/10/2017   3. TOBACCO ABUSE     History of MRSA infection Susceptibility    Methicillin resistant staphylococcus aureus    MIC    CIPROFLOXACIN >=8 RESISTANT  Resistant    CLINDAMYCIN <=0.25 SENSITIVE "><=0.25 SENS... Sensitive    ERYTHROMYCIN >=8 RESISTANT  Resistant    GENTAMICIN <=0.5 SENSITIVE "><=0.5 SENSI... Sensitive    Inducible Clindamycin NEGATIVE  Sensitive    OXACILLIN >=4 RESISTANT  Resistant    RIFAMPIN <=0.5 SENSITIVE "><=0.5 SENSI... Sensitive    TETRACYCLINE <=1 SENSITIVE "><=1 SENSITIVE  Sensitive    TRIMETH/SULFA <=10 SENSITIVE "><=10 SENSIT... Sensitive    VANCOMYCIN 1 SENSITIVE  Sensitive         Susceptibility Comments   Continue Doxycycline 100mg  BID Has appt with Dr. Johney Maine today at 1100   Cellulitis and abscess of trunk s/p I&D 04/10/2017 Called Dr. Johney Maine' office and shared OV findings today- 4cm palpable mass to left of open 12 cm surgical site. Pt continues to have fever/night sweats/pronounced pain. Dressing reinforced and OV scheduled for Dr. Johney Maine this morning at 1100. F/u with our clinic 4 weeks, sooner if needed.  TOBACCO ABUSE Failed Chantix and Wellbutrin. Nicoderm patch worked during hospitalization-rx sent in.    FOLLOW-UP:  Return in about 4 weeks (around 05/17/2017) for Wound Re-check.

## 2017-04-19 NOTE — Assessment & Plan Note (Signed)
Called Dr. Johney Maine' office and shared OV findings today- 4cm palpable mass to left of open 12 cm surgical site. Pt continues to have fever/night sweats/pronounced pain. Dressing reinforced and OV scheduled for Dr. Johney Maine this morning at 1100. F/u with our clinic 4 weeks, sooner if needed.

## 2017-04-19 NOTE — Assessment & Plan Note (Signed)
Susceptibility    Methicillin resistant staphylococcus aureus    MIC    CIPROFLOXACIN >=8 RESISTANT  Resistant    CLINDAMYCIN <=0.25 SENSITIVE "><=0.25 SENS... Sensitive    ERYTHROMYCIN >=8 RESISTANT  Resistant    GENTAMICIN <=0.5 SENSITIVE "><=0.5 SENSI... Sensitive    Inducible Clindamycin NEGATIVE  Sensitive    OXACILLIN >=4 RESISTANT  Resistant    RIFAMPIN <=0.5 SENSITIVE "><=0.5 SENSI... Sensitive    TETRACYCLINE <=1 SENSITIVE "><=1 SENSITIVE  Sensitive    TRIMETH/SULFA <=10 SENSITIVE "><=10 SENSIT... Sensitive    VANCOMYCIN 1 SENSITIVE  Sensitive         Susceptibility Comments   Continue Doxycycline 100mg  BID Has appt with Dr. Johney Maine today at 1100

## 2017-04-26 ENCOUNTER — Ambulatory Visit (HOSPITAL_COMMUNITY): Payer: Self-pay | Admitting: Surgery

## 2017-05-10 ENCOUNTER — Other Ambulatory Visit: Payer: Self-pay | Admitting: Adult Health

## 2017-05-10 ENCOUNTER — Telehealth: Payer: Self-pay | Admitting: Adult Health

## 2017-05-10 DIAGNOSIS — F419 Anxiety disorder, unspecified: Secondary | ICD-10-CM

## 2017-05-10 NOTE — Telephone Encounter (Signed)
Patient called states he is still having High aniexty, unable to focus & believes is ADHD--ask tha provider refer him out  For [facility]-- pls cb to [phone]. --glh

## 2017-05-10 NOTE — Telephone Encounter (Signed)
Mina Marble, NP put in order for referral - patient notified and to follow up if needed before that appointment.  MPulliam, CMA/RT(R)

## 2017-05-20 ENCOUNTER — Ambulatory Visit (INDEPENDENT_AMBULATORY_CARE_PROVIDER_SITE_OTHER): Payer: Managed Care, Other (non HMO) | Admitting: Adult Health

## 2017-05-20 ENCOUNTER — Encounter: Payer: Self-pay | Admitting: Adult Health

## 2017-05-20 VITALS — BP 118/79 | HR 79 | Temp 98.1°F

## 2017-05-20 DIAGNOSIS — I10 Essential (primary) hypertension: Secondary | ICD-10-CM | POA: Diagnosis not present

## 2017-05-20 DIAGNOSIS — L02219 Cutaneous abscess of trunk, unspecified: Secondary | ICD-10-CM | POA: Diagnosis not present

## 2017-05-20 DIAGNOSIS — G629 Polyneuropathy, unspecified: Secondary | ICD-10-CM | POA: Diagnosis not present

## 2017-05-20 DIAGNOSIS — Z0000 Encounter for general adult medical examination without abnormal findings: Secondary | ICD-10-CM | POA: Diagnosis not present

## 2017-05-20 DIAGNOSIS — F339 Major depressive disorder, recurrent, unspecified: Secondary | ICD-10-CM | POA: Diagnosis not present

## 2017-05-20 DIAGNOSIS — F411 Generalized anxiety disorder: Secondary | ICD-10-CM

## 2017-05-20 DIAGNOSIS — L03319 Cellulitis of trunk, unspecified: Secondary | ICD-10-CM | POA: Diagnosis not present

## 2017-05-20 DIAGNOSIS — F17200 Nicotine dependence, unspecified, uncomplicated: Secondary | ICD-10-CM

## 2017-05-20 DIAGNOSIS — F419 Anxiety disorder, unspecified: Secondary | ICD-10-CM

## 2017-05-20 DIAGNOSIS — R41840 Attention and concentration deficit: Secondary | ICD-10-CM

## 2017-05-20 MED ORDER — NICOTINE 21 MG/24HR TD PT24: MEDICATED_PATCH | TRANSDERMAL | 3 refills | Status: DC

## 2017-05-20 MED ORDER — SERTRALINE HCL 50 MG PO TABS: 50.0000 mg | ORAL_TABLET | Freq: Every day | ORAL | 3 refills | Status: DC

## 2017-05-20 NOTE — Assessment & Plan Note (Signed)
Controlled on pregabalin 300mg 

## 2017-05-20 NOTE — Progress Notes (Signed)
Subjective:    Patient ID: Eduardo [name], male    DOB: [birth year], 45 y.o.   MRN: [id number]  HPI:  Eduardo [name] is here for f/u:  Anxiety, smoking cessation, and wound re-check.  He had another I&D procedure 04/26/17.  He completed ABXs a few weeks ago and denies fever/night sweats/chills/pain.  He was seen and released by ID on Monday.  Home health wound care is continuing with M/W/F schedule.   He continues to smoke, <pack/day and is out of Nicoderm patches.   He is very frustrated with slow rate of healing, multiple surgical interventions and mounting medical bills.  He has been unable to work consistently in months and has been unable to secure overtime shifts at [employer] since resuming work.  He reports exacerbation of per-existing anxiety/depression and did score 1 on item 9 of PHQ-9 "Thoughts that you would be better off dead, or of hurting yourself".  We discussed this at length with CMA in room and he denies any plans of suicide.  He stated several times that "I'm just really pissed off with the bills and not being able to get overtime at work".  He reports support system of his girlfriend and friends.  He lives with his step-mother, however "we really lead separate lives".   Wound inspected and wound care provided with assistance of CMA.  Patient Care Team    Relationship Specialty Notifications Start End  Mina Marble D, NP PCP - General Family Medicine  03/03/17   Allyn Kenner, MD  Dermatology  04/06/17     Patient Active Problem List   Diagnosis Date Noted  . Chronic folliculitis 62/95/2841  . [place] 04/10/2017  . Cellulitis and abscess of trunk s/p I&D 04/10/2017 04/06/2017  . Fever chills 04/06/2017  . History of MRSA infection 04/06/2017  . Health care maintenance 03/03/2017  . Neuropathy 09/28/2015  . Major depression 12/26/2012  . Generalized anxiety disorder 12/26/2012  . Opiate dependence ([place]) 12/24/2012  . Benzodiazepine dependence ([place]) 12/24/2012  .  HTN (hypertension) 12/24/2012  . 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 OBESITY 09/09/2010  . TOBACCO ABUSE 09/09/2010  . LOW BACK PAIN, CHRONIC 09/09/2010     Past Medical History:  Diagnosis Date  . Anxiety   . Chronic back pain   . Degenerative disk disease   . Depression   . Hypertension   . MRSA (methicillin resistant staph aureus) culture positive   . Peripheral neuropathy   . Pilonidal cyst      Past Surgical History:  Procedure Laterality Date  . PILONIDAL CYST DRAINAGE [name] 04/10/2017   Procedure: IRRIGATION AND DEBRIDEMENT BACK ABSCESS;  Surgeon: [name], MD;  Location: [facility];  Service: General;  Laterality: [name];  . THORACIC OUTLET SURGERY  2000  . WRIST FUSION  2002     Family History  Problem Relation Age of Onset  . Cancer Maternal Grandmother        breast, ovarian & colon  . Hypertension Mother   . Diabetes Mother   . Hypertension Father   . Heart disease Father   . Stroke Paternal Grandfather   . Neuropathy Neg Hx      History  Drug Use No     History  Alcohol Use No    Comment: Rarely     History  Smoking Status  . Current Every Day Smoker  . Packs/day: 1.00  . Years: 15.00  . Types: Cigarettes  Smokeless Tobacco  . Never Used  Outpatient Encounter Prescriptions as of 05/20/2017  Medication Sig Note  . acetaminophen (TYLENOL) 500 MG tablet Take 1000 mg every 8 hours as long as you need pain medicine.   [name] amLODipine (NORVASC) 2.5 MG tablet Take 1 tablet (2.5 mg total) by mouth daily.   . pregabalin (LYRICA) 300 MG capsule Take 1 capsule (300 mg total) by mouth 2 (two) times daily.   . diphenhydramine-acetaminophen (TYLENOL PM) 25-500 MG TABS tablet You can use this at bedtime, instead of the regular Tylenol.    DO NOT TAKE MORE THAN 4000 MG OF TYLENOL PER DAY.  IT CAN HARM YOUR LIVER.   [name] nicotine (NICODERM CQ - DOSED IN MG/24 HOURS) 21 mg/24hr patch You can buy this over the counter.  It will help you to heal if you stop  smoking.   . sertraline (ZOLOFT) 50 MG tablet Take 1 tablet (50 mg total) by mouth daily.   . [DISCONTINUED] chlorhexidine (HIBICLENS) 4 % external liquid Use this for bathing for the next 7 days then you can go back to regular soap and water.  You can buy this over the counter also.   . [DISCONTINUED] doxycycline (VIBRA-TABS) 100 MG tablet Take 100 mg by mouth 2 (two) times daily. 04/10/2017: Started on 04/07/17 for a 21 day supply   . [DISCONTINUED] Multiple Vitamins-Minerals (MULTIVITAMIN WITH MINERALS) tablet Take 1 tablet by mouth daily.   . [DISCONTINUED] nicotine (NICODERM CQ - DOSED IN MG/24 HOURS) 14 mg/24hr patch Place 1 patch (14 mg total) onto the skin daily. (Patient not taking: Reported on 05/20/2017)   . [DISCONTINUED] nicotine (NICODERM CQ - DOSED IN MG/24 HOURS) 21 mg/24hr patch You can buy this over the counter.  It will help you to heal if you stop smoking. (Patient not taking: Reported on 05/20/2017)   . [DISCONTINUED] oxyCODONE (OXY IR/ROXICODONE) 5 MG immediate release tablet Take 1-2 tablets (5-10 mg total) by mouth every 4 (four) hours as needed for moderate pain.   . [DISCONTINUED] polyethylene glycol (MIRALAX / GLYCOLAX) packet You can buy this over the counter and use as needed for constipation.   . [DISCONTINUED] saccharomyces boulardii (FLORASTOR) 250 MG capsule This is a probiotic, it is to help you not develop colitis while you are on antibiotics.  You can get this at any Drug store, over the counter without a prescription.  It will be with the stuff for constipation.  Use it for the next month till completed.  Follow package instructions.    No facility-administered encounter medications on file as of 05/20/2017.     Allergies: Patient has no known allergies.  There is no height or weight on file to calculate BMI.  Blood pressure 118/79, pulse 79, temperature 98.1 F (36.7 C), temperature source Oral.    Review of Systems  Constitutional: Positive for activity change  and fatigue. Negative for appetite change, chills, diaphoresis and fever.  Respiratory: Negative for cough, chest tightness, shortness of breath, wheezing and stridor.   Cardiovascular: Negative for chest pain, palpitations and leg swelling.  Gastrointestinal: Negative for abdominal distention, abdominal pain, blood in stool, constipation, diarrhea, nausea and vomiting.  Endocrine: Negative for cold intolerance, heat intolerance, polydipsia, polyphagia and polyuria.  Genitourinary: Negative for difficulty urinating, flank pain and hematuria.  Musculoskeletal: Negative for gait problem.  Skin: Positive for wound. Negative for color change, pallor and rash.  Neurological: Negative for dizziness and headaches.  Hematological: Does not bruise/bleed easily.  Psychiatric/Behavioral: Positive for decreased concentration, dysphoric mood, self-injury and sleep  disturbance. Negative for agitation, behavioral problems, confusion, hallucinations and suicidal ideas. The patient is nervous/anxious. The patient is not hyperactive.        Discussed this at length with CMA in room, he vehemently denies suicide plans or real interest in harming himself/others.        Objective:   Physical Exam  Constitutional: He appears well-developed and well-nourished. No distress.  Skin: Skin is warm and dry. No rash noted. He is not diaphoretic. No erythema. No pallor.     One linear 11cm surgical wound that is healing well.  Center is open with red/healing tissue.  No drainage/swelling/streaking/excessive redness noted. Wound cleaned, then dressed-pt tolerated well.  Psychiatric: He has a normal mood and affect. His speech is normal and behavior is normal. Judgment and thought content normal. Cognition and memory are normal.  Well groomed. Smiled and even laughed several times during OV.   Nursing note and vitals reviewed.         Assessment & Plan:   1. Difficulty concentrating   2. Anxiety   3. Episode of  recurrent major depressive disorder, unspecified depression episode severity ([place])   4. Essential hypertension   5. Neuropathy   6. TOBACCO ABUSE   7. Generalized anxiety disorder   8. Health care maintenance   9. Cellulitis and abscess of trunk s/p I&D 04/10/2017     HTN (hypertension) BP at goal 118/79 Continue amlodipine 2.5mg  daily Stressed smoking cessation  Neuropathy ([facility]) Controlled on pregabalin 300mg    TOBACCO ABUSE Smoking<pack/day RF'd Nicoderm patch Discussed how smoking slows healing process  Generalized anxiety disorder Please start Zoloft 50mg  daily-if you develop ANY THOUGHTS OF HARMING YOURSELF/OTHERS-stop medication and seek immediate medical care. Psychiatry referral placed  Health care maintenance Increase water and eat heart healthy diet. RF on Nicoderm patch sent in. Continue wound care per home health. Return in 4 weeks for follow-up, sooner if needed  Cellulitis and abscess of trunk s/p I&D 04/10/2017 Released from ID 05/17/17 Continue with home health M/W/F, as directed.    FOLLOW-UP:  Return in about 4 weeks (around 06/17/2017) for Regular Follow Up, Evaluate Medication Effectiveness.

## 2017-05-20 NOTE — Assessment & Plan Note (Signed)
Released from ID 05/17/17 Continue with home health M/W/F, as directed.

## 2017-05-20 NOTE — Assessment & Plan Note (Signed)
>>  ASSESSMENT AND PLAN FOR NEUROPATHY WRITTEN ON 05/20/2017 10:06 AM BY DANFORD, KATY D, NP  Controlled on pregabalin 300mg

## 2017-05-20 NOTE — Assessment & Plan Note (Signed)
Please start Zoloft 50mg  daily-if you develop ANY THOUGHTS OF HARMING YOURSELF/OTHERS-stop medication and seek immediate medical care. Psychiatry referral placed

## 2017-05-20 NOTE — Patient Instructions (Addendum)
Sertraline tablets What is this medicine? SERTRALINE (SER tra leen) is used to treat depression. It may also be used to treat obsessive compulsive disorder, panic disorder, post-trauma stress, premenstrual dysphoric disorder (PMDD) or social anxiety. This medicine may be used for other purposes; ask your health care provider or pharmacist if you have questions. COMMON BRAND NAME(S): Zoloft What should I tell my health care provider before I take this medicine? They need to know if you have any of these conditions: -bleeding disorders -bipolar disorder or a family history of bipolar disorder -glaucoma -heart disease -high blood pressure -history of irregular heartbeat -history of low levels of calcium, magnesium, or potassium in the blood -if you often drink alcohol -liver disease -receiving electroconvulsive therapy -seizures -suicidal thoughts, plans, or attempt; a previous suicide attempt by you or a family member -take medicines that treat or prevent blood clots -thyroid disease -an unusual or allergic reaction to sertraline, other medicines, foods, dyes, or preservatives -pregnant or trying to get pregnant -breast-feeding How should I use this medicine? Take this medicine by mouth with a glass of water. Follow the directions on the prescription label. You can take it with or without food. Take your medicine at regular intervals. Do not take your medicine more often than directed. Do not stop taking this medicine suddenly except upon the advice of your doctor. Stopping this medicine too quickly may cause serious side effects or your condition may worsen. A special MedGuide will be given to you by the pharmacist with each prescription and refill. Be sure to read this information carefully each time. Talk to your pediatrician regarding the use of this medicine in children. While this drug may be prescribed for children as young as 7 years for selected conditions, precautions do  apply. Overdosage: If you think you have taken too much of this medicine contact a poison control center or emergency room at once. NOTE: This medicine is only for you. Do not share this medicine with others. What if I miss a dose? If you miss a dose, take it as soon as you can. If it is almost time for your next dose, take only that dose. Do not take double or extra doses. What may interact with this medicine? Do not take this medicine with any of the following medications: -cisapride -dofetilide -dronedarone -linezolid -MAOIs like Carbex, Eldepryl, Marplan, Nardil, and Parnate -methylene blue (injected into a vein) -pimozide -thioridazine This medicine may also interact with the following medications: -alcohol -amphetamines -aspirin and aspirin-like medicines -certain medicines for depression, anxiety, or psychotic disturbances -certain medicines for fungal infections like ketoconazole, fluconazole, posaconazole, and itraconazole -certain medicines for irregular heart beat like flecainide, quinidine, propafenone -certain medicines for migraine headaches like almotriptan, eletriptan, frovatriptan, naratriptan, rizatriptan, sumatriptan, zolmitriptan -certain medicines for sleep -certain medicines for seizures like carbamazepine, valproic acid, phenytoin -certain medicines that treat or prevent blood clots like warfarin, enoxaparin, dalteparin -cimetidine -digoxin -diuretics -fentanyl -isoniazid -lithium -NSAIDs, medicines for pain and inflammation, like ibuprofen or naproxen -other medicines that prolong the QT interval (cause an abnormal heart rhythm) -rasagiline -safinamide -supplements like St. John's wort, kava kava, valerian -tolbutamide -tramadol -tryptophan This list may not describe all possible interactions. Give your health care provider a list of all the medicines, herbs, non-prescription drugs, or dietary supplements you use. Also tell them if you smoke, drink  alcohol, or use illegal drugs. Some items may interact with your medicine. What should I watch for while using this medicine? Tell your doctor if your symptoms   do not get better or if they get worse. Visit your doctor or health care professional for regular checks on your progress. Because it may take several weeks to see the full effects of this medicine, it is important to continue your treatment as prescribed by your doctor. Patients and their families should watch out for new or worsening thoughts of suicide or depression. Also watch out for sudden changes in feelings such as feeling anxious, agitated, panicky, irritable, hostile, aggressive, impulsive, severely restless, overly excited and hyperactive, or not being able to sleep. If this happens, especially at the beginning of treatment or after a change in dose, call your health care professional. Dennis Bast may get drowsy or dizzy. Do not drive, use machinery, or do anything that needs mental alertness until you know how this medicine affects you. Do not stand or sit up quickly, especially if you are an older patient. This reduces the risk of dizzy or fainting spells. Alcohol may interfere with the effect of this medicine. Avoid alcoholic drinks. Your mouth may get dry. Chewing sugarless gum or sucking hard candy, and drinking plenty of water may help. Contact your doctor if the problem does not go away or is severe. What side effects may I notice from receiving this medicine? Side effects that you should report to your doctor or health care professional as soon as possible: -allergic reactions like skin rash, itching or hives, swelling of the face, lips, or tongue -anxious -black, tarry stools -changes in vision -confusion -elevated mood, decreased need for sleep, racing thoughts, impulsive behavior -eye pain -fast, irregular heartbeat -feeling faint or lightheaded, falls -feeling agitated, angry, or irritable -hallucination, loss of contact with  reality -loss of balance or coordination -loss of memory -painful or prolonged erections -restlessness, pacing, inability to keep still -seizures -stiff muscles -suicidal thoughts or other mood changes -trouble sleeping -unusual bleeding or bruising -unusually weak or tired -vomiting Side effects that usually do not require medical attention (report to your doctor or health care professional if they continue or are bothersome): -change in appetite or weight -change in sex drive or performance -diarrhea -increased sweating -indigestion, nausea -tremors This list may not describe all possible side effects. Call your doctor for medical advice about side effects. You may report side effects to FDA at 1-800-FDA-1088. Where should I keep my medicine? Keep out of the reach of children. Store at room temperature between 15 and 30 degrees C (59 and 86 degrees F). Throw away any unused medicine after the expiration date. NOTE: This sheet is a summary. It may not cover all possible information. If you have questions about this medicine, talk to your doctor, pharmacist, or health care provider.  2018 Elsevier/Gold Standard (2016-10-09 14:17:49)   Persistent Depressive Disorder, Adult Persistent depressive disorder (PDD) is a mental health condition that causes symptoms of low-level depression for 2 years or longer. It may also be called long-term (chronic) depression or dysthymia. PDD may include episodes of more severe depression that last for about 2 weeks (major depressive disorder or MDD). PDD can affect the way you think, feel, and sleep. This condition may also affect your relationships. You may be more likely to get sick if you have PDD. What are the causes? The exact cause of this condition is not known. PDD is most likely caused by a combination of things, which may include:  Genetic factors. These are traits that are passed along from parent to child.  Individual factors. Your  personality, your behavior, and the  way you handle your thoughts and feelings may contribute to PDD. This includes personality traits and behaviors learned from others.  Physical factors, such as: ? Differences in the part of your brain that controls emotion. This part of your brain may be different than it is in people who do not have PDD. ? Long-term (chronic) medical or psychiatric illnesses.  Social factors. Traumatic experiences or major life changes may play a role in the development of PDD.  What increases the risk? This condition is more likely to develop in women. The following factors may make you more likely to develop PDD:  A family history of depression.  Abnormally low levels of certain brain chemicals.  Traumatic events in childhood, especially abuse or the loss of a parent.  Being under a lot of stress, or long-term stress, especially from upsetting life experiences or losses.  A history of: ? Chronic physical illness. ? Other mental health disorders. ? Substance abuse.  Poor living conditions.  Experiencing social exclusion or discrimination on a regular basis.  What are the signs or symptoms? Symptoms of this condition occur for most of the day, and may include:  Fatigue or low energy.  Eating too much or too little.  Sleeping too much or too little.  Restlessness or agitation.  Feelings of hopelessness.  Feeling worthless or guilty.  Anxiety.  Poor concentration or difficulty making decisions.  Low self-esteem.  Negative outlook.  Inability to have fun or experience pleasure.  Social withdrawal.  Unexplained physical complaints.  Irritability.  Aggressive behavior or anger.  How is this diagnosed? This condition may be diagnosed based on:  Your symptoms.  Your medical history, including your mental health history. This may involve tests to evaluate your mental health. You may be asked questions about your lifestyle, including any  drug and alcohol use, and how long you have had symptoms of PDD.  A physical exam.  Blood tests to rule out other conditions.  You may be diagnosed with PDD if you have had a depressed mood for 2 years or longer, as well as other symptoms of depression. How is this treated? This condition is usually treated by mental health professionals, such as psychologists, psychiatrists, and clinical social workers. You may need more than one type of treatment. Treatment may include:  Psychotherapy. This is also called talk therapy or counseling. Types of psychotherapy include: ? Cognitive behavioral therapy (CBT). This type of therapy teaches you to recognize unhealthy feelings, thoughts, and behaviors, and replace them with positive thoughts and actions. ? Interpersonal therapy (IPT). This helps you to improve the way you relate to and communicate with others. ? Family therapy. This treatment includes members of your family.  Medicine to treat anxiety and depression, or to help you control certain emotions and behaviors.  Lifestyle changes, such as: ? Limiting alcohol and drug use. ? Exercising regularly. ? Getting plenty of sleep. ? Making healthy eating choices. ? Spending more time outdoors.  Follow these instructions at home: Activity  Return to your normal activities as told by your health care provider.  Exercise regularly and spend time outdoors as told by your health care provider. General instructions  Take over-the-counter and prescription medicines only as told by your health care provider.  Do not drink alcohol. If you drink alcohol, limit your alcohol intake to no more than 1 drink a day for nonpregnant women and 2 drinks a day for men. One drink equals 12 oz of beer, 5 oz of wine,  or 1 oz of hard liquor. Alcohol can affect any antidepressant medicines you are taking. Talk to your health care provider about your alcohol use.  Eat a healthy diet and get plenty of sleep.  Find  activities that you enjoy doing, and make time to do them.  Consider joining a support group. Your health care provider may be able to recommend a support group.  Keep all follow-up visits as told by your health care provider. This is important. Where to find more information: [employer] on Mental Illness  www.nami.org  U.S. National Institute of Mental Health  https://carter.com/  National Suicide Prevention Lifeline  1-800-273-TALK 209-608-7381). This is free, 24-hour help.  Contact a health care provider if:  Your symptoms get worse.  You develop new symptoms.  You have trouble sleeping or doing your daily activities. Get help right away if:  You self-harm.  You have serious thoughts about hurting yourself or others.  You see, hear, taste, smell, or feel things that are not present (hallucinate). This information is not intended to replace advice given to you by your health care provider. Make sure you discuss any questions you have with your health care provider. Document Released: 09/21/2012 Document Revised: 06/04/2016 Document Reviewed: 04/18/2016 Elsevier Interactive Patient Education  2017 [place].  Please start Zoloft 50mg  daily-if you develop ANY THOUGHTS OF HARMING YOURSELF/OTHERS-stop medication and seek immediate medical care. Increase water and eat heart healthy diet. RF on Nicoderm patch sent in. Continue wound care per home health. Psychiatry referral placed. Return in 4 weeks for follow-up, sooner if needed.

## 2017-05-20 NOTE — Assessment & Plan Note (Signed)
Increase water and eat heart healthy diet. RF on Nicoderm patch sent in. Continue wound care per home health. Return in 4 weeks for follow-up, sooner if needed

## 2017-05-20 NOTE — Assessment & Plan Note (Signed)
BP at goal 118/79 Continue amlodipine 2.5mg  daily Stressed smoking cessation

## 2017-05-20 NOTE — Assessment & Plan Note (Addendum)
Smoking<pack/day RF'd Nicoderm patch Discussed how smoking slows healing process

## 2017-06-03 ENCOUNTER — Ambulatory Visit (INDEPENDENT_AMBULATORY_CARE_PROVIDER_SITE_OTHER): Payer: Managed Care, Other (non HMO) | Admitting: Adult Health

## 2017-06-03 ENCOUNTER — Encounter: Payer: Self-pay | Admitting: Adult Health

## 2017-06-03 VITALS — BP 128/88 | HR 82 | Temp 98.3°F | Ht 76.0 in | Wt 317.6 lb

## 2017-06-03 DIAGNOSIS — L089 Local infection of the skin and subcutaneous tissue, unspecified: Secondary | ICD-10-CM

## 2017-06-03 DIAGNOSIS — I10 Essential (primary) hypertension: Secondary | ICD-10-CM

## 2017-06-03 DIAGNOSIS — F17200 Nicotine dependence, unspecified, uncomplicated: Secondary | ICD-10-CM

## 2017-06-03 MED ORDER — DOXYCYCLINE HYCLATE 100 MG PO TABS: 100.0000 mg | ORAL_TABLET | Freq: Two times a day (BID) | ORAL | 0 refills | Status: DC

## 2017-06-03 NOTE — Assessment & Plan Note (Signed)
BP at goal 128/88, HR 82 Continue amlodipine 2.5mg  daily.

## 2017-06-03 NOTE — Patient Instructions (Signed)

## 2017-06-03 NOTE — Progress Notes (Signed)
Subjective:    Patient ID: Eduardo [name], male    DOB: October 03, 1971, 46 y.o.   MRN: [id number]  HPI:  [name] presents recurrence of abscesses on back that developed Monday 05/31/17.  He has total of three areas of infection and he reports tenderness with palpation (dull ache, rated 6/10). He started old Rx of Doxycycline 100mg  on Tuesday and has had 5 total doses at time of OV. He denies CP/dyspnea/dizziness/HA/fever/night sweats/malaise/poor appetite.  He continues to smoke, however continues to reduce and is down to 1/2-<1 pack/day. He has not called his Psychologist, sport and exercise.  He has never been seen by ID, despite these infections occurring since 2000.  He reports that his father struggled with re-current MRSA infections. Of Note:  He has lost 10lbs since last weight on 04/19/17. Patient Care Team    Relationship Specialty Notifications Start End  Eduardo Marble D, NP PCP - General Family Medicine  03/03/17   Eduardo [name]  Dermatology  04/06/17     Patient Active Problem List   Diagnosis Date Noted  . Skin infection 06/03/2017  . Chronic folliculitis 41/66/0630  . Blackhead 04/10/2017  . Cellulitis and abscess of trunk s/p I&[name] 04/10/2017 04/06/2017  . Fever chills 04/06/2017  . History of MRSA infection 04/06/2017  . Health care maintenance 03/03/2017  . Neuropathy 09/28/2015  . Major depression 12/26/2012  . Generalized anxiety disorder 12/26/2012  . Opiate dependence ([place]) 12/24/2012  . Benzodiazepine dependence ([place]) 12/24/2012  . HTN (hypertension) 12/24/2012  . 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 OBESITY 09/09/2010  . TOBACCO ABUSE 09/09/2010  . LOW BACK PAIN, CHRONIC 09/09/2010     Past Medical History:  Diagnosis Date  . Anxiety   . Chronic back pain   . Degenerative disk disease   . Depression   . Hypertension   . MRSA (methicillin resistant staph aureus) culture positive   . Peripheral neuropathy   . Pilonidal cyst      Past Surgical History:  Procedure  Laterality Date  . PILONIDAL CYST DRAINAGE [name] 04/10/2017   Procedure: IRRIGATION AND DEBRIDEMENT BACK ABSCESS;  Surgeon: [name], [name];  Location: [facility];  Service: General;  Laterality: [name];  . THORACIC OUTLET SURGERY  2000  . WRIST FUSION  2002     Family History  Problem Relation Age of Onset  . Cancer Maternal Grandmother        breast, ovarian & colon  . Hypertension Mother   . Diabetes Mother   . Hypertension Father   . Heart disease Father   . Stroke Paternal Grandfather   . Neuropathy Neg Hx      History  Drug Use No     History  Alcohol Use No    Comment: Rarely     History  Smoking Status  . Current Every Day Smoker  . Packs/day: 1.00  . Years: 15.00  . Types: Cigarettes  Smokeless Tobacco  . Never Used     Outpatient Encounter Prescriptions as of 06/03/2017  Medication Sig  . acetaminophen (TYLENOL) 500 MG tablet Take 1000 mg every 8 hours as long as you need pain medicine.  [name] amLODipine (NORVASC) 2.5 MG tablet Take 1 tablet (2.5 mg total) by mouth daily.  . diphenhydramine-acetaminophen (TYLENOL PM) 25-500 MG TABS tablet You can use this at bedtime, instead of the regular Tylenol.    DO NOT TAKE MORE THAN 4000 MG OF TYLENOL PER DAY.  IT CAN HARM YOUR LIVER.  [name] doxycycline (VIBRA-TABS) 100  MG tablet Take 1 tablet (100 mg total) by mouth 2 (two) times daily.  . nicotine (NICODERM CQ - DOSED IN MG/24 HOURS) 21 mg/24hr patch You can buy this over the counter.  It will help you to heal if you stop smoking.  . pregabalin (LYRICA) 300 MG capsule Take 1 capsule (300 mg total) by mouth 2 (two) times daily.  . sertraline (ZOLOFT) 50 MG tablet Take 1 tablet (50 mg total) by mouth daily.  . [DISCONTINUED] doxycycline (VIBRA-TABS) 100 MG tablet Take 1 tablet (100 mg total) by mouth 2 (two) times daily.  . [DISCONTINUED] doxycycline (VIBRA-TABS) 100 MG tablet Take 1 tablet by mouth 2 (two) times daily.   No facility-administered encounter medications on file  as of 06/03/2017.     Allergies: Patient has no known allergies.  Body mass index is 38.66 kg/m.  Blood pressure 128/88, pulse 82, temperature 98.3 F (36.8 C), temperature source Oral, height 6\' 4"  (1.93 m), weight (!) 317 lb 9.6 oz (144.1 kg).     Review of Systems  Constitutional: Positive for fatigue. Negative for activity change, appetite change, chills, diaphoresis, fever and unexpected weight change.  Respiratory: Negative for cough, chest tightness, shortness of breath, wheezing and stridor.   Cardiovascular: Negative for chest pain, palpitations and leg swelling.  Musculoskeletal: Positive for myalgias.  Skin: Positive for wound. Negative for color change, pallor and rash.  Hematological: Does not bruise/bleed easily.       Objective:   Physical Exam  Constitutional: He is oriented to person, place, and time. He appears well-developed and well-nourished. No distress.  Neurological: He is alert and oriented to person, place, and time.  Skin: Skin is warm and dry. He is not diaphoretic.     The new abscesses noted: L thoracic: 2.5cm in diameter R upper thoraic: 0.5cm in diameter R lower thoracic: 4.5 cm in diameter All areas warm to the touch and very red. The largest of the three has crusted black center.  Psychiatric: He has a normal mood and affect. His behavior is normal. Judgment and thought content normal.  Nursing note and vitals reviewed.         Assessment & Plan:   1. Skin infection   2. Essential hypertension   3. TOBACCO ABUSE     HTN (hypertension) BP at goal 128/88, HR 82 Continue amlodipine 2.5mg  daily.  Skin infection Abscesses on middle/lower back developed 3 days ago. He has taken 3 days of Doxycycline 100mg  BID. He is instructed to discard old rx and start new 7 day course of doxycycline (which will total 10 days of total ABX therapy). Wound care/precaution instructions provided. Advised to call his surgeon ASAP. Due to recurrent  nature of these infections-ID referral placed. Continue to reduce-stop tobacco use.   TOBACCO ABUSE Continue to reduce-stop tobacco use.  Down to 1/2- <1 pack/day. Does not require RD on Nicoderm.    FOLLOW-UP:  Return if symptoms worsen or fail to improve.

## 2017-06-03 NOTE — Assessment & Plan Note (Signed)
Continue to reduce-stop tobacco use.  Down to 1/2- <1 pack/day. Does not require RD on Nicoderm.

## 2017-06-03 NOTE — Assessment & Plan Note (Signed)
Abscesses on middle/lower back developed 3 days ago. He has taken 3 days of Doxycycline 100mg  BID. He is instructed to discard old rx and start new 7 day course of doxycycline (which will total 10 days of total ABX therapy). Wound care/precaution instructions provided. Advised to call his surgeon ASAP. Due to recurrent nature of these infections-ID referral placed. Continue to reduce-stop tobacco use.

## 2017-06-17 ENCOUNTER — Ambulatory Visit (INDEPENDENT_AMBULATORY_CARE_PROVIDER_SITE_OTHER): Payer: Managed Care, Other (non HMO) | Admitting: Adult Health

## 2017-06-17 ENCOUNTER — Encounter: Payer: Self-pay | Admitting: Adult Health

## 2017-06-17 VITALS — BP 124/84 | HR 73 | Ht 76.0 in | Wt 325.4 lb

## 2017-06-17 DIAGNOSIS — M545 Low back pain: Secondary | ICD-10-CM | POA: Diagnosis not present

## 2017-06-17 DIAGNOSIS — F17200 Nicotine dependence, unspecified, uncomplicated: Secondary | ICD-10-CM

## 2017-06-17 DIAGNOSIS — G8929 Other chronic pain: Secondary | ICD-10-CM | POA: Diagnosis not present

## 2017-06-17 DIAGNOSIS — Z0000 Encounter for general adult medical examination without abnormal findings: Secondary | ICD-10-CM

## 2017-06-17 DIAGNOSIS — I10 Essential (primary) hypertension: Secondary | ICD-10-CM | POA: Diagnosis not present

## 2017-06-17 DIAGNOSIS — F411 Generalized anxiety disorder: Secondary | ICD-10-CM | POA: Diagnosis not present

## 2017-06-17 MED ORDER — BUSPIRONE HCL 7.5 MG PO TABS: 7.5000 mg | ORAL_TABLET | Freq: Two times a day (BID) | ORAL | 0 refills | Status: DC

## 2017-06-17 MED ORDER — BUSPIRONE HCL 7.5 MG PO TABS: ORAL_TABLET | ORAL | 0 refills | Status: DC

## 2017-06-17 MED ORDER — AMLODIPINE BESYLATE 2.5 MG PO TABS: 2.5000 mg | ORAL_TABLET | Freq: Every day | ORAL | 2 refills | Status: DC

## 2017-06-17 MED ORDER — NICOTINE 21 MG/24HR TD PT24: MEDICATED_PATCH | TRANSDERMAL | 3 refills | Status: DC

## 2017-06-17 NOTE — Assessment & Plan Note (Signed)
CPE with fasting labs in 2 months. He cancelled his appt with ID-he was encouraged to re-schedule, re: recurrent boil infections.

## 2017-06-17 NOTE — Assessment & Plan Note (Signed)
Again refilled Nicoderm and discussed at length that smoking cessation will help with pain reduction and improve overall health!

## 2017-06-17 NOTE — Assessment & Plan Note (Signed)
Taper off Sertraline 50mg -1/2 tablet daily for 2 weeks. Started on Buspirone 7.5mg  BID CBT referral placed and he will receive call today to schedule appt. Continue to abstain from [facility] and reduce-stop tobacco use. Will evaluate medication effectiveness at CPE in 2 months. He denies any thoughts of harming himself/others.

## 2017-06-17 NOTE — Progress Notes (Signed)
Subjective:    Patient ID: GREGARY BLACKARD, male    DOB: 02-Sep-1971, 46 y.o.   MRN: [id number]  HPI:  Mr. Siegel is here for f/u: Depression/anxiety-he was started on Sertraline 50mg  4 weeks ago and he feels that the "feelings of frustration" have reduced, however anxiety/depression are not improving.  He also reports nocturia that prevents restful sleep-estimates to urinate 2-4 times/night.  He drinks water and mnt dew all day, last soda is usually 2100. He also complains of diffuse pain- bil shoulders/back/bil hips/bil knees and he has hx of opiate dependence.  He feels that the pregabalin 300mg  "only helps a bit" with the neuropathy.   CBT referral was placed >4 weeks ago and he has not been called to schedule appt-we will track that down. He cancelled ID appt, "since the boils aren't bothering me anymore".  Patient Care Team    Relationship Specialty Notifications Start End  Mina Marble D, NP PCP - General Family Medicine  03/03/17   Allyn Kenner, MD  Dermatology  04/06/17     Patient Active Problem List   Diagnosis Date Noted  . Skin infection 06/03/2017  . Chronic folliculitis 32/99/2426  . [place] 04/10/2017  . Cellulitis and abscess of trunk s/p I&D 04/10/2017 04/06/2017  . Fever chills 04/06/2017  . History of MRSA infection 04/06/2017  . Health care maintenance 03/03/2017  . Neuropathy 09/28/2015  . Major depression 12/26/2012  . Generalized anxiety disorder 12/26/2012  . Opiate dependence ([place]) 12/24/2012  . Benzodiazepine dependence ([place]) 12/24/2012  . HTN (hypertension) 12/24/2012  . 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 OBESITY 09/09/2010  . TOBACCO ABUSE 09/09/2010  . LOW BACK PAIN, CHRONIC 09/09/2010     Past Medical History:  Diagnosis Date  . Anxiety   . Chronic back pain   . Degenerative disk disease   . Depression   . Hypertension   . MRSA (methicillin resistant staph aureus) culture positive   . Peripheral neuropathy   . Pilonidal cyst       Past Surgical History:  Procedure Laterality Date  . PILONIDAL CYST DRAINAGE [name] 04/10/2017   Procedure: IRRIGATION AND DEBRIDEMENT BACK ABSCESS;  Surgeon: [name], MD;  Location: [facility];  Service: General;  Laterality: [name];  . THORACIC OUTLET SURGERY  2000  . WRIST FUSION  2002     Family History  Problem Relation Age of Onset  . Cancer Maternal Grandmother        breast, ovarian & colon  . Hypertension Mother   . Diabetes Mother   . Hypertension Father   . Heart disease Father   . Stroke Paternal Grandfather   . Neuropathy Neg Hx      History  Drug Use No     History  Alcohol Use No    Comment: Rarely     History  Smoking Status  . Current Every Day Smoker  . Packs/day: 1.00  . Years: 15.00  . Types: Cigarettes  Smokeless Tobacco  . Never Used     Outpatient Encounter Prescriptions as of 06/17/2017  Medication Sig  . acetaminophen (TYLENOL) 500 MG tablet Take 1000 mg every 8 hours as long as you need pain medicine.  [name] amLODipine (NORVASC) 2.5 MG tablet Take 1 tablet (2.5 mg total) by mouth daily.  . diphenhydramine-acetaminophen (TYLENOL PM) 25-500 MG TABS tablet You can use this at bedtime, instead of the regular Tylenol.    DO NOT TAKE MORE THAN 4000 MG OF TYLENOL PER DAY.  IT  CAN HARM YOUR LIVER.  [name] nicotine (NICODERM CQ - DOSED IN MG/24 HOURS) 21 mg/24hr patch You can buy this over the counter.  It will help you to heal if you stop smoking.  . pregabalin (LYRICA) 300 MG capsule Take 1 capsule (300 mg total) by mouth 2 (two) times daily.  . [DISCONTINUED] amLODipine (NORVASC) 2.5 MG tablet Take 1 tablet (2.5 mg total) by mouth daily.  . [DISCONTINUED] doxycycline (VIBRA-TABS) 100 MG tablet Take 1 tablet (100 mg total) by mouth 2 (two) times daily.  . [DISCONTINUED] nicotine (NICODERM CQ - DOSED IN MG/24 HOURS) 21 mg/24hr patch You can buy this over the counter.  It will help you to heal if you stop smoking.  . [DISCONTINUED] sertraline (ZOLOFT)  50 MG tablet Take 1 tablet (50 mg total) by mouth daily.  . busPIRone (BUSPAR) 7.5 MG tablet One tablet by mouth twice daily.  . [DISCONTINUED] busPIRone (BUSPAR) 7.5 MG tablet Take 1 tablet (7.5 mg total) by mouth 2 times daily at 12 noon and 4 pm.   No facility-administered encounter medications on file as of 06/17/2017.     Allergies: Patient has no known allergies.  Body mass index is 39.61 kg/m.  Blood pressure 124/84, pulse 73, height 6\' 4"  (1.93 m), weight (!) 325 lb 6.4 oz (147.6 kg).      Review of Systems  Constitutional: Positive for fatigue. Negative for activity change, appetite change, chills, diaphoresis, fever and unexpected weight change.  Eyes: Negative for visual disturbance.  Respiratory: Negative for cough, chest tightness, shortness of breath, wheezing and stridor.   Cardiovascular: Negative for chest pain, palpitations and leg swelling.  Endocrine: Negative for cold intolerance, heat intolerance, polydipsia, polyphagia and polyuria.  Genitourinary: Negative for difficulty urinating.  Musculoskeletal: Positive for arthralgias, back pain, gait problem, joint swelling, myalgias, neck pain and neck stiffness.  Neurological: Negative for dizziness and headaches.  Hematological: Does not bruise/bleed easily.  Psychiatric/Behavioral: Positive for dysphoric mood and sleep disturbance. Negative for agitation, behavioral problems, confusion, decreased concentration, hallucinations, self-injury and suicidal ideas. The patient is nervous/anxious. The patient is not hyperactive.        Objective:   Physical Exam  Constitutional: He is oriented to person, place, and time. He appears well-developed and well-nourished. No distress.  Cardiovascular: Normal rate, regular rhythm, normal heart sounds and intact distal pulses.   No murmur heard. Pulmonary/Chest: Effort normal and breath sounds normal. No respiratory distress. He has no wheezes. He has no rales. He exhibits no  tenderness.  Neurological: He is alert and oriented to person, place, and time.  Skin: Skin is warm and dry. No rash noted. He is not diaphoretic. No erythema. No pallor.  No active areas of infection noted on back.  Psychiatric: His speech is normal and behavior is normal. Judgment and thought content normal. His affect is blunt. Cognition and memory are normal.  Nursing note and vitals reviewed.         Assessment & Plan:   1. Chronic bilateral low back pain, with sciatica presence unspecified   2. Essential hypertension   3. Generalized anxiety disorder   4. TOBACCO ABUSE     HTN (hypertension) BP at goal 124/84, HR 73 Continue Amlodipine 2.5mg  daily. Reduce-stop tobacco use. Denies acute cardiac sx's.  LOW BACK PAIN, CHRONIC Chronic pain at multiple sites with hx of opiate dependence. We discussed that weight loss and regular exercise could help reduce pain. He would like to re-start or at the least be evaluated  for oral pain control. Pain Clinic referral placed.    Generalized anxiety disorder Taper off Sertraline 50mg -1/2 tablet daily for 2 weeks. Started on Buspirone 7.5mg  BID CBT referral placed and he will receive call today to schedule appt. Continue to abstain from [facility] and reduce-stop tobacco use. Will evaluate medication effectiveness at CPE in 2 months. He denies any thoughts of harming himself/others.   TOBACCO ABUSE Again refilled Nicoderm and discussed at length that smoking cessation will help with pain reduction and improve overall health!     FOLLOW-UP:  Return in about 2 months (around 08/17/2017) for CPE, Fasting Lab Draw.

## 2017-06-17 NOTE — Assessment & Plan Note (Signed)
Chronic pain at multiple sites with hx of opiate dependence. We discussed that weight loss and regular exercise could help reduce pain. He would like to re-start or at the least be evaluated for oral pain control. Pain Clinic referral placed.

## 2017-06-17 NOTE — Assessment & Plan Note (Signed)
BP at goal 124/84, HR 73 Continue Amlodipine 2.5mg  daily. Reduce-stop tobacco use. Denies acute cardiac sx's.

## 2017-06-17 NOTE — Patient Instructions (Addendum)
Generalized Anxiety Disorder, Adult Generalized anxiety disorder (GAD) is a mental health disorder. People with this condition constantly worry about everyday events. Unlike normal anxiety, worry related to GAD is not triggered by a specific event. These worries also do not fade or get better with time. GAD interferes with life functions, including relationships, work, and school. GAD can vary from mild to severe. People with severe GAD can have intense waves of anxiety with physical symptoms (panic attacks). What are the causes? The exact cause of GAD is not known. What increases the risk? This condition is more likely to develop in:  Women.  People who have a family history of anxiety disorders.  People who are very shy.  People who experience very stressful life events, such as the death of a loved one.  People who have a very stressful family environment.  What are the signs or symptoms? People with GAD often worry excessively about many things in their lives, such as their health and family. They may also be overly concerned about:  Doing well at work.  Being on time.  Natural disasters.  Friendships.  Physical symptoms of GAD include:  Fatigue.  Muscle tension or having muscle twitches.  Trembling or feeling shaky.  Being easily startled.  Feeling like your heart is pounding or racing.  Feeling out of breath or like you cannot take a deep breath.  Having trouble falling asleep or staying asleep.  Sweating.  Nausea, diarrhea, or irritable bowel syndrome (IBS).  Headaches.  Trouble concentrating or remembering facts.  Restlessness.  Irritability.  How is this diagnosed? Your health care provider can diagnose GAD based on your symptoms and medical history. You will also have a physical exam. The health care provider will ask specific questions about your symptoms, including how severe they are, when they started, and if they come and go. Your health care  provider may ask you about your use of alcohol or drugs, including prescription medicines. Your health care provider may refer you to a mental health specialist for further evaluation. Your health care provider will do a thorough examination and may perform additional tests to rule out other possible causes of your symptoms. To be diagnosed with GAD, a person must have anxiety that:  Is out of his or her control.  Affects several different aspects of his or her life, such as work and relationships.  Causes distress that makes him or her unable to take part in normal activities.  Includes at least three physical symptoms of GAD, such as restlessness, fatigue, trouble concentrating, irritability, muscle tension, or sleep problems.  Before your health care provider can confirm a diagnosis of GAD, these symptoms must be present more days than they are not, and they must last for six months or longer. How is this treated? The following therapies are usually used to treat GAD:  Medicine. Antidepressant medicine is usually prescribed for long-term daily control. Antianxiety medicines may be added in severe cases, especially when panic attacks occur.  Talk therapy (psychotherapy). Certain types of talk therapy can be helpful in treating GAD by providing support, education, and guidance. Options include: ? Cognitive behavioral therapy (CBT). People learn coping skills and techniques to ease their anxiety. They learn to identify unrealistic or negative thoughts and behaviors and to replace them with positive ones. ? Acceptance and commitment therapy (ACT). This treatment teaches people how to be mindful as a way to cope with unwanted thoughts and feelings. ? Biofeedback. This process trains you to   manage your body's response (physiological response) through breathing techniques and relaxation methods. You will work with a therapist while machines are used to monitor your physical symptoms.  Stress  management techniques. These include yoga, meditation, and exercise.  A mental health specialist can help determine which treatment is best for you. Some people see improvement with one type of therapy. However, other people require a combination of therapies. Follow these instructions at home:  Take over-the-counter and prescription medicines only as told by your health care provider.  Try to maintain a normal routine.  Try to anticipate stressful situations and allow extra time to manage them.  Practice any stress management or self-calming techniques as taught by your health care provider.  Do not punish yourself for setbacks or for not making progress.  Try to recognize your accomplishments, even if they are small.  Keep all follow-up visits as told by your health care provider. This is important. Contact a health care provider if:  Your symptoms do not get better.  Your symptoms get worse.  You have signs of depression, such as: ? A persistently sad, cranky, or irritable mood. ? Loss of enjoyment in activities that used to bring you joy. ? Change in weight or eating. ? Changes in sleeping habits. ? Avoiding friends or family members. ? Loss of energy for normal tasks. ? Feelings of guilt or worthlessness. Get help right away if:  You have serious thoughts about hurting yourself or others. If you ever feel like you may hurt yourself or others, or have thoughts about taking your own life, get help right away. You can go to your nearest emergency department or call:  Your local emergency services (911 in the [place]  A suicide crisis helpline, such as the [place] at (952) 808-0909. This is open 24 hours a day.  Summary  Generalized anxiety disorder (GAD) is a mental health disorder that involves worry that is not triggered by a specific event.  People with GAD often worry excessively about many things in their lives, such as their health and  family.  GAD may cause physical symptoms such as restlessness, trouble concentrating, sleep problems, frequent sweating, nausea, diarrhea, headaches, and trembling or muscle twitching.  A mental health specialist can help determine which treatment is best for you. Some people see improvement with one type of therapy. However, other people require a combination of therapies. This information is not intended to replace advice given to you by your health care provider. Make sure you discuss any questions you have with your health care provider. Document Released: 01/30/2013 Document Revised: 08/25/2016 Document Reviewed: 08/25/2016 Elsevier Interactive Patient Education  2018 [employer].   Hypertension Hypertension, commonly called high blood pressure, is when the force of blood pumping through the arteries is too strong. The arteries are the blood vessels that carry blood from the heart throughout the body. Hypertension forces the heart to work harder to pump blood and may cause arteries to become narrow or stiff. Having untreated or uncontrolled hypertension can cause heart attacks, strokes, kidney disease, and other problems. A blood pressure reading consists of a higher number over a lower number. Ideally, your blood pressure should be below 120/80. The first ("top") number is called the systolic pressure. It is a measure of the pressure in your arteries as your heart beats. The second ("bottom") number is called the diastolic pressure. It is a measure of the pressure in your arteries as the heart relaxes. What are the causes?  The cause of this condition is not known. What increases the risk? Some risk factors for high blood pressure are under your control. Others are not. Factors you can change  Smoking.  Having type 2 diabetes mellitus, high cholesterol, or both.  Not getting enough exercise or physical activity.  Being overweight.  Having too much fat, sugar, calories, or salt  (sodium) in your diet.  Drinking too much alcohol. Factors that are difficult or impossible to change  Having chronic kidney disease.  Having a family history of high blood pressure.  Age. Risk increases with age.  Race. You may be at higher risk if you are African-American.  Gender. Men are at higher risk than women before age 45. After age 71, women are at higher risk than men.  Having obstructive sleep apnea.  Stress. What are the signs or symptoms? Extremely high blood pressure (hypertensive crisis) may cause:  Headache.  Anxiety.  Shortness of breath.  Nosebleed.  Nausea and vomiting.  Severe chest pain.  Jerky movements you cannot control (seizures).  How is this diagnosed? This condition is diagnosed by measuring your blood pressure while you are seated, with your arm resting on a surface. The cuff of the blood pressure monitor will be placed directly against the skin of your upper arm at the level of your heart. It should be measured at least twice using the same arm. Certain conditions can cause a difference in blood pressure between your right and left arms. Certain factors can cause blood pressure readings to be lower or higher than normal (elevated) for a short period of time:  When your blood pressure is higher when you are in a health care provider's office than when you are at home, this is called white coat hypertension. Most people with this condition do not need medicines.  When your blood pressure is higher at home than when you are in a health care provider's office, this is called masked hypertension. Most people with this condition may need medicines to control blood pressure.  If you have a high blood pressure reading during one visit or you have normal blood pressure with other risk factors:  You may be asked to return on a different day to have your blood pressure checked again.  You may be asked to monitor your blood pressure at home for 1 week  or longer.  If you are diagnosed with hypertension, you may have other blood or imaging tests to help your health care provider understand your overall risk for other conditions. How is this treated? This condition is treated by making healthy lifestyle changes, such as eating healthy foods, exercising more, and reducing your alcohol intake. Your health care provider may prescribe medicine if lifestyle changes are not enough to get your blood pressure under control, and if:  Your systolic blood pressure is above 130.  Your diastolic blood pressure is above 80.  Your personal target blood pressure may vary depending on your medical conditions, your age, and other factors. Follow these instructions at home: Eating and drinking  Eat a diet that is high in fiber and potassium, and low in sodium, added sugar, and fat. An example eating plan is called the DASH (Dietary Approaches to Stop Hypertension) diet. To eat this way: ? Eat plenty of fresh fruits and vegetables. Try to fill half of your plate at each meal with fruits and vegetables. ? Eat whole grains, such as whole wheat pasta, brown rice, or whole grain bread. Fill about  one quarter of your plate with whole grains. ? Eat or drink low-fat dairy products, such as skim milk or low-fat yogurt. ? Avoid fatty cuts of meat, processed or cured meats, and poultry with skin. Fill about one quarter of your plate with lean proteins, such as fish, chicken without skin, beans, eggs, and tofu. ? Avoid premade and processed foods. These tend to be higher in sodium, added sugar, and fat.  Reduce your daily sodium intake. Most people with hypertension should eat less than 1,500 mg of sodium a day.  Limit alcohol intake to no more than 1 drink a day for nonpregnant women and 2 drinks a day for men. One drink equals 12 oz of beer, 5 oz of wine, or 1 oz of hard liquor. Lifestyle  Work with your health care provider to maintain a healthy body weight or to  lose weight. Ask what an ideal weight is for you.  Get at least 30 minutes of exercise that causes your heart to beat faster (aerobic exercise) most days of the week. Activities may include walking, swimming, or biking.  Include exercise to strengthen your muscles (resistance exercise), such as pilates or lifting weights, as part of your weekly exercise routine. Try to do these types of exercises for 30 minutes at least 3 days a week.  Do not use any products that contain nicotine or tobacco, such as cigarettes and e-cigarettes. If you need help quitting, ask your health care provider.  Monitor your blood pressure at home as told by your health care provider.  Keep all follow-up visits as told by your health care provider. This is important. Medicines  Take over-the-counter and prescription medicines only as told by your health care provider. Follow directions carefully. Blood pressure medicines must be taken as prescribed.  Do not skip doses of blood pressure medicine. Doing this puts you at risk for problems and can make the medicine less effective.  Ask your health care provider about side effects or reactions to medicines that you should watch for. Contact a health care provider if:  You think you are having a reaction to a medicine you are taking.  You have headaches that keep coming back (recurring).  You feel dizzy.  You have swelling in your ankles.  You have trouble with your vision. Get help right away if:  You develop a severe headache or confusion.  You have unusual weakness or numbness.  You feel faint.  You have severe pain in your chest or abdomen.  You vomit repeatedly.  You have trouble breathing. Summary  Hypertension is when the force of blood pumping through your arteries is too strong. If this condition is not controlled, it may put you at risk for serious complications.  Your personal target blood pressure may vary depending on your medical  conditions, your age, and other factors. For most people, a normal blood pressure is less than 120/80.  Hypertension is treated with lifestyle changes, medicines, or a combination of both. Lifestyle changes include weight loss, eating a healthy, low-sodium diet, exercising more, and limiting alcohol. This information is not intended to replace advice given to you by your health care provider. Make sure you discuss any questions you have with your health care provider. Document Released: 10/05/2005 Document Revised: 09/02/2016 Document Reviewed: 09/02/2016 Elsevier Interactive Patient Education  2018 [place] off Zoloft, please take 1/2 tablet daily for 2 weeks then stop. Please start Buspar twice daily. Try not to drink any fluid after  7pm. Pain clinic referral placed. Nicoderm patches refilled-please try to reduce-stop tobacco. Follow heart healthy diet. Please schedule full physical with fasting labs this fall and to evaluate Buspar effectiveness. Please call clinic with any questions/concerns.

## 2017-06-22 ENCOUNTER — Ambulatory Visit: Payer: Self-pay | Admitting: Internal Medicine

## 2017-07-01 ENCOUNTER — Telehealth: Payer: Self-pay

## 2017-07-01 NOTE — Telephone Encounter (Signed)
I called Eduardo [name] per Dr. Rexene Alberts request, advised him that Dr. Rexene Alberts reviewed his chart and feels that there is nothing else she can offer him at this point for his neuropathy after Dr. Cathren Laine extensive work up. She recommends that he proceed with the pain management appt to also address his back pain and neuropathy. Eduardo [name] is agreeable to this and I have cancelled his appt for 07/05/17. Eduardo [name] verbalized understanding.

## 2017-07-05 ENCOUNTER — Telehealth: Payer: Self-pay | Admitting: Adult Health

## 2017-07-05 ENCOUNTER — Ambulatory Visit: Payer: Managed Care, Other (non HMO) | Admitting: Neurology

## 2017-07-05 NOTE — Telephone Encounter (Signed)
Patient needs letter faxed to employer validating his absence from work from 6/18-7/16, if this accurate, it should be faxed to [phone]. Front can draft and fax, just need to check if this is accurate.

## 2017-07-05 NOTE — Telephone Encounter (Signed)
Per Mina Marble, pt needs to obtain this letter from [facility] since that is who took him out of work and wrote his last work excuse [employer].  LVM for pt to call to discuss.  Charyl Bigger, CMA

## 2017-07-06 ENCOUNTER — Telehealth: Payer: Self-pay | Admitting: Adult Health

## 2017-07-06 NOTE — Telephone Encounter (Signed)
Discussed with patient.  Patient expressed understanding.  Charyl Bigger, CMA

## 2017-07-06 NOTE — Telephone Encounter (Signed)
Patient returned  MA call. --glh

## 2017-07-12 ENCOUNTER — Ambulatory Visit (INDEPENDENT_AMBULATORY_CARE_PROVIDER_SITE_OTHER): Payer: Managed Care, Other (non HMO) | Admitting: Adult Health

## 2017-07-12 ENCOUNTER — Encounter: Payer: Self-pay | Admitting: Adult Health

## 2017-07-12 ENCOUNTER — Ambulatory Visit: Payer: Managed Care, Other (non HMO)

## 2017-07-12 VITALS — BP 121/81 | HR 76 | Ht 76.0 in | Wt 334.3 lb

## 2017-07-12 DIAGNOSIS — Z0000 Encounter for general adult medical examination without abnormal findings: Secondary | ICD-10-CM | POA: Diagnosis not present

## 2017-07-12 DIAGNOSIS — M25572 Pain in left ankle and joints of left foot: Secondary | ICD-10-CM | POA: Insufficient documentation

## 2017-07-12 MED ORDER — MELOXICAM 15 MG PO TABS: 15.0000 mg | ORAL_TABLET | Freq: Every day | ORAL | 0 refills | Status: DC

## 2017-07-12 NOTE — Progress Notes (Signed)
Subjective:    Patient ID: Eduardo [name], male    DOB: Jan 25, 1971, 46 y.o.   MRN: [id number]  HPI:  Eduardo [name] presents with L ankle pain that started Sat when he stepped into a "pothole" while weed eating.  He denies falling to the ground L ankle pain starts over calcaneocuboid joint and radiates to lateal malleolus, rated 8/10 and described as stabbing. He has been using OTC Acetaminophen without much sx relief and states that "he cannot use ice bc it feels like needles on my skin" due to his neuropathy.  He reports difficulty walking and standing for prolonged period, however is able to perform ADLs and reported to work this morning.  Patient Care Team    Relationship Specialty Notifications Start End  Mina Marble D, NP PCP - General Family Medicine  03/03/17   Allyn Kenner, MD  Dermatology  04/06/17     Patient Active Problem List   Diagnosis Date Noted  . Acute left ankle pain 07/12/2017  . Skin infection 06/03/2017  . Chronic folliculitis 78/29/5621  . [place] 04/10/2017  . Cellulitis and abscess of trunk s/p I&D 04/10/2017 04/06/2017  . Fever chills 04/06/2017  . History of MRSA infection 04/06/2017  . Health care maintenance 03/03/2017  . Neuropathy 09/28/2015  . Major depression 12/26/2012  . Generalized anxiety disorder 12/26/2012  . Opiate dependence ([place]) 12/24/2012  . Benzodiazepine dependence ([place]) 12/24/2012  . HTN (hypertension) 12/24/2012  . 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 OBESITY 09/09/2010  . TOBACCO ABUSE 09/09/2010  . LOW BACK PAIN, CHRONIC 09/09/2010     Past Medical History:  Diagnosis Date  . Anxiety   . Chronic back pain   . Degenerative disk disease   . Depression   . Hypertension   . MRSA (methicillin resistant staph aureus) culture positive   . Peripheral neuropathy   . Pilonidal cyst      Past Surgical History:  Procedure Laterality Date  . PILONIDAL CYST DRAINAGE [name] 04/10/2017   Procedure: IRRIGATION AND DEBRIDEMENT  BACK ABSCESS;  Surgeon: [name], MD;  Location: [facility];  Service: General;  Laterality: [name];  . THORACIC OUTLET SURGERY  2000  . WRIST FUSION  2002     Family History  Problem Relation Age of Onset  . Cancer Maternal Grandmother        breast, ovarian & colon  . Hypertension Mother   . Diabetes Mother   . Hypertension Father   . Heart disease Father   . Stroke Paternal Grandfather   . Neuropathy Neg Hx      History  Drug Use No     History  Alcohol Use No    Comment: Rarely     History  Smoking Status  . Current Every Day Smoker  . Packs/day: 1.00  . Years: 15.00  . Types: Cigarettes  Smokeless Tobacco  . Never Used     Outpatient Encounter Prescriptions as of 07/12/2017  Medication Sig  . acetaminophen (TYLENOL) 500 MG tablet Take 1000 mg every 8 hours as long as you need pain medicine.  [name] amLODipine (NORVASC) 2.5 MG tablet Take 1 tablet (2.5 mg total) by mouth daily.  . diphenhydramine-acetaminophen (TYLENOL PM) 25-500 MG TABS tablet You can use this at bedtime, instead of the regular Tylenol.    DO NOT TAKE MORE THAN 4000 MG OF TYLENOL PER DAY.  IT CAN HARM YOUR LIVER.  . fexofenadine (ALLEGRA) 60 MG tablet Take 60 mg by mouth daily.  [name]  nicotine (NICODERM CQ - DOSED IN MG/24 HOURS) 21 mg/24hr patch You can buy this over the counter.  It will help you to heal if you stop smoking.  . pregabalin (LYRICA) 300 MG capsule Take 1 capsule (300 mg total) by mouth 2 (two) times daily.  . [DISCONTINUED] busPIRone (BUSPAR) 7.5 MG tablet One tablet by mouth twice daily.  . meloxicam (MOBIC) 15 MG tablet Take 1 tablet (15 mg total) by mouth daily.   No facility-administered encounter medications on file as of 07/12/2017.     Allergies: Patient has no known allergies.  Body mass index is 40.69 kg/m.  Blood pressure 121/81, pulse 76, height 6\' 4"  (1.93 m), weight (!) 334 lb 4.8 oz (151.6 kg).   Review of Systems  Constitutional: Positive for activity change  and fatigue. Negative for appetite change, chills, diaphoresis, fever and unexpected weight change.  Respiratory: Negative for cough, chest tightness, shortness of breath, wheezing and stridor.   Cardiovascular: Negative for chest pain, palpitations and leg swelling.  Musculoskeletal: Positive for arthralgias, gait problem and myalgias.  Hematological: Does not bruise/bleed easily.       Objective:   Physical Exam  Constitutional: He appears well-developed and well-nourished. No distress.  HENT:  Head: Normocephalic and atraumatic.  Right Ear: External ear normal.  Left Ear: External ear normal.  Musculoskeletal: He exhibits tenderness. He exhibits no edema or deformity.       Left knee: Normal.       Right ankle: Normal.       Left ankle: He exhibits normal range of motion, no swelling, no laceration and normal pulse. Tenderness. Lateral malleolus and CF ligament tenderness found. No medial malleolus and no AITFL tenderness found. Achilles tendon exhibits no pain.  Skin: He is not diaphoretic.  Nursing note and vitals reviewed.         Assessment & Plan:   1. Acute left ankle pain   2. Health care maintenance     Acute left ankle pain Please take Meloxiam 15mg  daily, as needed for pain. Please use OTC Acetaminophen for breakthrough pain. Continue using compression socks or ace wrap/ankle sleeve to help with swelling and stability. Elevate ankle as often as you can. If pain persists/worsens for > 2weeks, please call clinic and we will refer to PT. Xray: Negative for fx-pt updated.  Health care maintenance Increase water intake, strive for at least gallon/day.   Follow Heart Healthy diet. Please schedule full physical with fasting labs next month.    FOLLOW-UP:  Return if symptoms worsen or fail to improve.

## 2017-07-12 NOTE — Assessment & Plan Note (Addendum)
Please take Meloxiam 15mg  daily, as needed for pain. Please use OTC Acetaminophen for breakthrough pain. Continue using compression socks or ace wrap/ankle sleeve to help with swelling and stability. Elevate ankle as often as you can. If pain persists/worsens for > 2weeks, please call clinic and we will refer to PT. Xray: Negative for fx-pt updated.

## 2017-07-12 NOTE — Patient Instructions (Addendum)
Ankle Sprain An ankle sprain is a stretch or tear in one of the tough, fiber-like tissues (ligaments) in the ankle. The ligaments in your ankle help to hold the bones of the ankle together. What are the causes? This condition is often caused by stepping on or falling on the outer edge of the foot. What increases the risk? This condition is more likely to develop in people who play sports. What are the signs or symptoms? Symptoms of this condition include:  Pain in your ankle.  Swelling.  Bruising. Bruising may develop right after you sprain your ankle or 1-2 days later.  Trouble standing or walking, especially when you turn or change directions.  How is this diagnosed? This condition is diagnosed with a physical exam. During the exam, your health care provider will press on certain parts of your foot and ankle and try to move them in certain ways. X-rays may be taken to see how severe the sprain is and to check for broken bones. How is this treated? This condition may be treated with:  A brace. This is used to keep the ankle from moving until it heals.  An elastic bandage. This is used to support the ankle.  Crutches.  Pain medicine.  Surgery. This may be needed if the sprain is severe.  Physical therapy. This may help to improve the range of motion in the ankle.  Follow these instructions at home:  Rest your ankle.  Take over-the-counter and prescription medicines only as told by your health care provider.  For 2-3 days, keep your ankle raised (elevated) above the level of your heart as much as possible.  If directed, apply ice to the area: ? Put ice in a plastic bag. ? Place a towel between your skin and the bag. ? Leave the ice on for 20 minutes, 2-3 times a day.  If you were given a brace: ? Wear it as directed. ? Remove it to shower or bathe. ? Try not to move your ankle much, but wiggle your toes from time to time. This helps to prevent swelling.  If you were  given an elastic bandage (dressing): ? Remove it to shower or bathe. ? Try not to move your ankle much, but wiggle your toes from time to time. This helps to prevent swelling. ? Adjust the dressing to make it more comfortable if it feels too tight. ? Loosen the dressing if you have numbness or tingling in your foot, or if your foot becomes cold and blue.  If you have crutches, use them as told by your health care provider. Continue to use them until you can walk without feeling pain in your ankle. Contact a health care provider if:  You have rapidly increasing bruising or swelling.  Your pain is not relieved with medicine. Get help right away if:  Your toes or foot becomes numb or blue.  You have severe pain that gets worse. This information is not intended to replace advice given to you by your health care provider. Make sure you discuss any questions you have with your health care provider. Document Released: 10/05/2005 Document Revised: 02/12/2016 Document Reviewed: 05/07/2015 Elsevier Interactive Patient Education  2017 [place].   Please take Meloxiam 15mg  daily, as needed for pain. Please use OTC Acetaminophen for breakthrough pain. Continue using compression socks or ace wrap/ankle sleeve to help with swelling and stability. Elevate ankle as often as you can. If pain persists/worsens for > 2weeks, please call clinic and we  will refer to PT. Increase water intake, strive for at least gallon/day.   Follow Heart Healthy diet. Please schedule full physical with fasting labs next month.

## 2017-07-12 NOTE — Assessment & Plan Note (Signed)
Increase water intake, strive for at least gallon/day.   Follow Heart Healthy diet. Please schedule full physical with fasting labs next month.

## 2017-07-27 ENCOUNTER — Encounter: Payer: Self-pay | Admitting: Physical Medicine & Rehabilitation

## 2017-07-29 ENCOUNTER — Other Ambulatory Visit: Payer: Self-pay | Admitting: Adult Health

## 2017-07-29 ENCOUNTER — Telehealth: Payer: Self-pay | Admitting: Neurology

## 2017-07-29 DIAGNOSIS — G609 Hereditary and idiopathic neuropathy, unspecified: Secondary | ICD-10-CM

## 2017-07-29 DIAGNOSIS — E538 Deficiency of other specified B group vitamins: Secondary | ICD-10-CM

## 2017-07-29 NOTE — Telephone Encounter (Signed)
Pt states that Dr. Jaynee Eagles "cut him loose" and wanted PCP to "take over this medication".  Advised pt that he would need OV to discuss and evaluate.  Pt stated that he would call Dr. Cathren Laine office back to see if they would refill.  Charyl Bigger, CMA

## 2017-07-29 NOTE — Telephone Encounter (Signed)
We have not prescribed these medications for the patient previously.  Please review and refill if appropriate.  T. Greogory Cornette, CMA  

## 2017-07-29 NOTE — Telephone Encounter (Signed)
Patient is requesting a refill of his Lyrica sent to [facility]

## 2017-07-29 NOTE — Telephone Encounter (Signed)
Pt calling for a refill of pregabalin (LYRICA) 300 MG capsule, please send to  [facility] - [place], [place] - [address] [phone] (Phone) [phone] (Fax)

## 2017-07-29 NOTE — Addendum Note (Signed)
Addended by: Fonnie Mu on: 07/29/2017 11:22 AM   Modules accepted: Orders

## 2017-07-30 ENCOUNTER — Emergency Department (HOSPITAL_BASED_OUTPATIENT_CLINIC_OR_DEPARTMENT_OTHER)
Admission: EM | Admit: 2017-07-30 | Discharge: 2017-07-30 | Disposition: A | Discharge status: Home / Self Care | Payer: Managed Care, Other (non HMO) | Attending: Emergency Medicine | Admitting: Emergency Medicine

## 2017-07-30 ENCOUNTER — Encounter (HOSPITAL_BASED_OUTPATIENT_CLINIC_OR_DEPARTMENT_OTHER): Payer: Self-pay

## 2017-07-30 DIAGNOSIS — Y929 Unspecified place or not applicable: Secondary | ICD-10-CM | POA: Insufficient documentation

## 2017-07-30 DIAGNOSIS — W268XXA Contact with other sharp object(s), not elsewhere classified, initial encounter: Secondary | ICD-10-CM | POA: Insufficient documentation

## 2017-07-30 DIAGNOSIS — I10 Essential (primary) hypertension: Secondary | ICD-10-CM | POA: Insufficient documentation

## 2017-07-30 DIAGNOSIS — Y998 Other external cause status: Secondary | ICD-10-CM | POA: Diagnosis not present

## 2017-07-30 DIAGNOSIS — Z23 Encounter for immunization: Secondary | ICD-10-CM | POA: Diagnosis not present

## 2017-07-30 DIAGNOSIS — F17210 Nicotine dependence, cigarettes, uncomplicated: Secondary | ICD-10-CM | POA: Insufficient documentation

## 2017-07-30 DIAGNOSIS — Z79899 Other long term (current) drug therapy: Secondary | ICD-10-CM | POA: Diagnosis not present

## 2017-07-30 DIAGNOSIS — Z8614 Personal history of Methicillin resistant Staphylococcus aureus infection: Secondary | ICD-10-CM | POA: Diagnosis not present

## 2017-07-30 DIAGNOSIS — Y939 Activity, unspecified: Secondary | ICD-10-CM | POA: Insufficient documentation

## 2017-07-30 DIAGNOSIS — L03115 Cellulitis of right lower limb: Secondary | ICD-10-CM | POA: Insufficient documentation

## 2017-07-30 DIAGNOSIS — S8992XA Unspecified injury of left lower leg, initial encounter: Secondary | ICD-10-CM | POA: Diagnosis present

## 2017-07-30 MED ORDER — SULFAMETHOXAZOLE-TRIMETHOPRIM 800-160 MG PO TABS: 1.0000 | ORAL_TABLET | Freq: Two times a day (BID) | ORAL | 0 refills | Status: AC

## 2017-07-30 MED ORDER — MUPIROCIN CALCIUM 2 % EX CREA: 1.0000 "application " | TOPICAL_CREAM | Freq: Two times a day (BID) | CUTANEOUS | 0 refills | Status: DC

## 2017-07-30 MED ORDER — TETANUS-DIPHTH-ACELL PERTUSSIS 5-2.5-18.5 LF-MCG/0.5 IM SUSP
0.5000 mL | Freq: Once | INTRAMUSCULAR | Status: AC
  Administered 2017-07-30: 0.5 mL via INTRAMUSCULAR

## 2017-07-30 MED ORDER — TETANUS-DIPHTH-ACELL PERTUSSIS 5-2.5-18.5 LF-MCG/0.5 IM SUSP
INTRAMUSCULAR | Status: AC
  Filled 2017-07-30: qty 0.5

## 2017-07-30 MED FILL — SULFAMETHOXAZOLE/TMP DS TAB: 800-160 | 10 days supply | Qty: 20 | Fill #0

## 2017-07-30 MED FILL — MUPIROCIN 2% OINTMENT: 2 | 7 days supply | Qty: 22 | Fill #0

## 2017-07-30 NOTE — ED Notes (Signed)
Family at bedside. 

## 2017-07-30 NOTE — ED Notes (Signed)
Called x2 in waiting room ,no answer,

## 2017-07-30 NOTE — ED Provider Notes (Signed)
[name] Provider Note   CSN: [id number] Arrival date & time: 07/30/17  1240     History   Chief Complaint Chief Complaint  Patient presents with  . Leg Injury    HPI Eduardo [name] is a 46 y.o. male.  Patient is a 46 year old male with a history of prior MRSA who presents with a wound to his left lower leg. He states that about 2 weeks ago he scratched his leg on the edge of a car door. He states over last 2-3 days he's noticed some redness around the area with some increased soreness. No drainage. No streaking up the leg. No fevers. He's not sure when his last tetanus shot occurred.      Past Medical History:  Diagnosis Date  . Anxiety   . Chronic back pain   . Degenerative disk disease   . Depression   . Hypertension   . MRSA (methicillin resistant staph aureus) culture positive   . Peripheral neuropathy   . Pilonidal cyst     Patient Active Problem List   Diagnosis Date Noted  . Acute left ankle pain 07/12/2017  . Skin infection 06/03/2017  . Chronic folliculitis 34/28/7681  . [place] 04/10/2017  . Cellulitis and abscess of trunk s/p I&D 04/10/2017 04/06/2017  . Fever chills 04/06/2017  . History of MRSA infection 04/06/2017  . Health care maintenance 03/03/2017  . Neuropathy 09/28/2015  . Major depression 12/26/2012  . Generalized anxiety disorder 12/26/2012  . Opiate dependence ([place]) 12/24/2012  . Benzodiazepine dependence ([place]) 12/24/2012  . HTN (hypertension) 12/24/2012  . 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 OBESITY 09/09/2010  . TOBACCO ABUSE 09/09/2010  . LOW BACK PAIN, CHRONIC 09/09/2010    Past Surgical History:  Procedure Laterality Date  . PILONIDAL CYST DRAINAGE [name] 04/10/2017   Procedure: IRRIGATION AND DEBRIDEMENT BACK ABSCESS;  Surgeon: [name], MD;  Location: [facility];  Service: General;  Laterality: [name];  . THORACIC OUTLET SURGERY  2000  . WRIST FUSION  2002       Home Medications    Prior to  Admission medications   Medication Sig Start Date End Date Taking? Authorizing Provider  acetaminophen (TYLENOL) 500 MG tablet Take 1000 mg every 8 hours as long as you need pain medicine. 04/13/17   Earnstine Regal, PA-C  amLODipine (NORVASC) 2.5 MG tablet Take 1 tablet (2.5 mg total) by mouth daily. 06/17/17   Danford, Valetta Fuller D, NP  diphenhydramine-acetaminophen (TYLENOL PM) 25-500 MG TABS tablet You can use this at bedtime, instead of the regular Tylenol.    DO NOT TAKE MORE THAN 4000 MG OF TYLENOL PER DAY.  IT CAN HARM YOUR LIVER. 04/13/17   Earnstine Regal, PA-C  fexofenadine (ALLEGRA) 60 MG tablet Take 60 mg by mouth daily.    [provider]  meloxicam (MOBIC) 15 MG tablet Take 1 tablet (15 mg total) by mouth daily. 07/12/17   Danford, Valetta Fuller D, NP  mupirocin cream (BACTROBAN) 2 % Apply 1 application topically 2 (two) times daily. For 7 days 07/30/17   Malvin Johns, MD  nicotine (NICODERM CQ - DOSED IN MG/24 HOURS) 21 mg/24hr patch You can buy this over the counter.  It will help you to heal if you stop smoking. 06/17/17   Danford, Valetta Fuller D, NP  pregabalin (LYRICA) 300 MG capsule Take 1 capsule (300 mg total) by mouth 2 (two) times daily. 12/30/16   Melvenia Beam, MD  sulfamethoxazole-trimethoprim (BACTRIM DS,SEPTRA DS) 800-160 MG tablet Take 1  tablet by mouth 2 (two) times daily. 07/30/17 08/06/17  Malvin Johns, MD    Family History Family History  Problem Relation Age of Onset  . Cancer Maternal Grandmother        breast, ovarian & colon  . Hypertension Mother   . Diabetes Mother   . Hypertension Father   . Heart disease Father   . Stroke Paternal Grandfather   . Neuropathy Neg Hx     Social History Social History  Substance Use Topics  . Smoking status: Current Every Day Smoker    Packs/day: 1.00    Years: 15.00    Types: Cigarettes  . Smokeless tobacco: Never Used  . Alcohol use No     Comment: Rarely     Allergies   Patient has no known  allergies.   Review of Systems Review of Systems  Constitutional: Negative for fever.  Gastrointestinal: Negative for nausea and vomiting.  Musculoskeletal: Negative for arthralgias, back pain, joint swelling and neck pain.  Skin: Positive for wound.  Neurological: Negative for weakness, numbness and headaches.     Physical Exam Updated Vital Signs BP (!) 139/93 (BP Location: Right Arm)   Pulse 90   Temp 98.8 F (37.1 C) (Oral)   Resp 20   Ht 6\' 4"  (1.93 m)   Wt (!) 152 kg (335 lb)   SpO2 98%   BMI 40.78 kg/m   Physical Exam  Constitutional: He is oriented to person, place, and time. He appears well-developed and well-nourished.  HENT:  Head: Normocephalic and atraumatic.  Neck: Normal range of motion. Neck supple.  Cardiovascular: Normal rate.   Pulmonary/Chest: Effort normal.  Musculoskeletal: He exhibits no edema or tenderness.  Patient has a 2-3 cm circular area on his right lateral lower leg which is erythematous and tender to the touch. There is a central abrasion. There is no drainage. There is no induration or fluctuance. No streaking up the leg. Pedal pulses are intact.  Neurological: He is alert and oriented to person, place, and time.  Skin: Skin is warm and dry.  Psychiatric: He has a normal mood and affect.     ED Treatments / Results  Labs (all labs ordered are listed, but only abnormal results are displayed) Labs Reviewed - No data to display  EKG  EKG Interpretation None       Radiology No results found.  Procedures Procedures (including critical care time)  Medications Ordered in ED Medications  Tdap (BOOSTRIX) 5-2.5-18.5 LF-MCG/0.5 injection (not administered)  Tdap (BOOSTRIX) injection 0.5 mL (0.5 mLs Intramuscular Given 07/30/17 1428)     Initial Impression / Assessment and Plan / ED Course  I have reviewed the triage vital signs and the nursing notes.  Pertinent labs & imaging results that were available during my care of the  patient were reviewed by me and considered in my medical decision making (see chart for details).     Patient is a small area of cellulitis to his right lower leg. There is a small central wound. There is nothing that appears drainable. No systemic signs of illness. He was discharged home in good condition. He was given prescription for Bactroban ointment and Bactrim. He was encouraged to follow-up with his primary care physician. His tetanus shot was also updated. Return precautions were given.  Final Clinical Impressions(s) / ED Diagnoses   Final diagnoses:  Cellulitis of right lower extremity    New Prescriptions New Prescriptions   MUPIROCIN CREAM (BACTROBAN) 2 %  Apply 1 application topically 2 (two) times daily. For 7 days   SULFAMETHOXAZOLE-TRIMETHOPRIM (BACTRIM DS,SEPTRA DS) 800-160 MG TABLET    Take 1 tablet by mouth 2 (two) times daily.     Malvin Johns, MD 07/30/17 1444

## 2017-07-30 NOTE — ED Triage Notes (Signed)
Pt states car door struck right LE-skin break-now having redness to area x 5 days-NAD-steady gait

## 2017-08-02 ENCOUNTER — Encounter: Payer: Self-pay | Admitting: Physical Medicine & Rehabilitation

## 2017-08-02 ENCOUNTER — Encounter: Payer: Managed Care, Other (non HMO) | Attending: Physical Medicine & Rehabilitation

## 2017-08-02 ENCOUNTER — Ambulatory Visit (HOSPITAL_BASED_OUTPATIENT_CLINIC_OR_DEPARTMENT_OTHER): Payer: Managed Care, Other (non HMO) | Admitting: Physical Medicine & Rehabilitation

## 2017-08-02 VITALS — BP 123/79 | HR 81 | Resp 16

## 2017-08-02 DIAGNOSIS — M545 Low back pain, unspecified: Secondary | ICD-10-CM

## 2017-08-02 DIAGNOSIS — Z9889 Other specified postprocedural states: Secondary | ICD-10-CM | POA: Diagnosis not present

## 2017-08-02 DIAGNOSIS — F419 Anxiety disorder, unspecified: Secondary | ICD-10-CM | POA: Diagnosis not present

## 2017-08-02 DIAGNOSIS — G609 Hereditary and idiopathic neuropathy, unspecified: Secondary | ICD-10-CM | POA: Diagnosis not present

## 2017-08-02 DIAGNOSIS — G608 Other hereditary and idiopathic neuropathies: Secondary | ICD-10-CM | POA: Insufficient documentation

## 2017-08-02 DIAGNOSIS — Z833 Family history of diabetes mellitus: Secondary | ICD-10-CM | POA: Insufficient documentation

## 2017-08-02 DIAGNOSIS — M5136 Other intervertebral disc degeneration, lumbar region: Secondary | ICD-10-CM | POA: Insufficient documentation

## 2017-08-02 DIAGNOSIS — G8929 Other chronic pain: Secondary | ICD-10-CM

## 2017-08-02 DIAGNOSIS — Z8041 Family history of malignant neoplasm of ovary: Secondary | ICD-10-CM | POA: Diagnosis not present

## 2017-08-02 DIAGNOSIS — Z8249 Family history of ischemic heart disease and other diseases of the circulatory system: Secondary | ICD-10-CM | POA: Insufficient documentation

## 2017-08-02 DIAGNOSIS — I10 Essential (primary) hypertension: Secondary | ICD-10-CM | POA: Insufficient documentation

## 2017-08-02 DIAGNOSIS — Z803 Family history of malignant neoplasm of breast: Secondary | ICD-10-CM | POA: Diagnosis not present

## 2017-08-02 DIAGNOSIS — Z800 Family history of malignant neoplasm of digestive organs: Secondary | ICD-10-CM | POA: Insufficient documentation

## 2017-08-02 DIAGNOSIS — F17210 Nicotine dependence, cigarettes, uncomplicated: Secondary | ICD-10-CM | POA: Diagnosis not present

## 2017-08-02 DIAGNOSIS — Z823 Family history of stroke: Secondary | ICD-10-CM | POA: Insufficient documentation

## 2017-08-02 MED ORDER — PREGABALIN 300 MG PO CAPS: 300.0000 mg | ORAL_CAPSULE | Freq: Two times a day (BID) | ORAL | 1 refills | Status: DC

## 2017-08-02 MED ORDER — TRAMADOL HCL 50 MG PO TABS: 50.0000 mg | ORAL_TABLET | Freq: Three times a day (TID) | ORAL | 5 refills | Status: DC | PRN

## 2017-08-02 MED ORDER — NORTRIPTYLINE HCL 25 MG PO CAPS: 25.0000 mg | ORAL_CAPSULE | Freq: Every day | ORAL | 5 refills | Status: DC

## 2017-08-02 NOTE — Patient Instructions (Signed)

## 2017-08-02 NOTE — Progress Notes (Signed)
Subjective:    Patient ID: Eduardo [name], male    DOB: 09-Nov-1970, 46 y.o.   MRN: [id number]  HPI CC:  Chronic low back pain  Patient states pain. Onset was in 1999. Patient also complains pain that runs down both legs down to his toes. Patient has had these symptoms since 2009. Patient states that the pain interferes with his sleep. He has not had a good night's rest for many years. The Lyrica is helpful, it does cause some drowsiness and some nocturnal enuresis.  Patient has been evaluated by orthopedic spine surgery, referred to physiatry, underwent epidural injections. He states these were not very helpful for him.  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  Patient has had EMG, was told he had neuropathy, no history of diabetes. Patient denies diabetic history. Last hemoglobin A1c was in 2016 Reviewed neurology note as well as lab workup, has had testing for TSH, RPR, CMP, B12, folate, vitamin D, hemoglobin A1c, heavy metal profile,, autoimmune panel methylmalonic acid, homocysteine level, vitamin B6, ANA, CRP, rheumatoid factor, IFE and SPEP, CK level and vitamin B1 Patient states he only rarely drinks alcohol. No family history of neuropathy, except in his mother who had diabetes Patient has been diagnosed with rheumatoid arthritis. Rheumatoid factor latex testing was negative, CRP was mildly elevated, patient has not seen a rheumatologist.  Opioid risk score is 6 Patient had an admission for opiate and benzodiazepine dependence. He actually detoxed for 1 week prior to admission at home. He was at one time, taking both Percocet and Klonopin, which were both prescribed medically, denied taking these meds from illicit sources  Long history of depression, states he has a referral to a psychologist, we discussed that there is one available at this office as well. Pain Inventory Average Pain 9 Pain Right Now 9 My pain is intermittent,  constant, sharp, burning, dull, stabbing, tingling and aching  In the last 24 hours, has pain interfered with the following? General activity 8 Relation with others 9 Enjoyment of life 10 What TIME of day is your pain at its worst? night Sleep (in general) Poor  Pain is worse with: walking, bending, sitting, inactivity and standing Pain improves with: medication Relief from Meds: 6  Mobility walk without assistance ability to climb steps?  yes do you drive?  yes transfers alone  Function employed # of hrs/week 40 Do you have any goals in this area?  no  Neuro/Psych bladder control problems weakness numbness tingling trouble walking dizziness confusion depression anxiety suicidal thoughts  Prior Studies new visit EXAM: MRI LUMBAR SPINE WITHOUT CONTRAST  TECHNIQUE: Multiplanar, multisequence MR imaging of the lumbar spine was performed. No intravenous contrast was administered.  COMPARISON:  MRI 06/28/2004  FINDINGS: Normal lumbar alignment. Negative for fracture or mass lesion. No bone marrow lesion identified. Conus medullaris appears normal and terminates at L1-2.  L1-2:  Mild degenerative change without stenosis  L2-3:  Negative  L3-4: Mild disc degeneration and disc bulging. Small right foraminal disc protrusion has developed since the prior study. Right L3 nerve root appears to exit above the disc protrusion. Mild facet degeneration and mild spinal stenosis.  L4-5: Mild disc degeneration. Small left lateral disc protrusion and osteophyte similar to the prior study. Mild facet hypertrophy has progressed. Slight spinal stenosis also has progressed.  L5-S1: Small central disc protrusion unchanged from the prior study without neural impingement  IMPRESSION: Small right foraminal disc protrusion L3-4, not present in  2005. Mild spinal stenosis at L3-4  Small left lateral disc protrusion and osteophyte L4-5 similar to the prior study.  Slight progression of spinal stenosis at this level.  Small central disc protrusion L5-S1 is unchanged.   Electronically Signed   By: Franchot Gallo M.D.   On: 01/29/2015 09:26 Physicians involved in your care new visit   Family History  Problem Relation Age of Onset  . Cancer Maternal Grandmother        breast, ovarian & colon  . Hypertension Mother   . Diabetes Mother   . Hypertension Father   . Heart disease Father   . Stroke Paternal Grandfather   . Neuropathy Neg Hx    Social History   Social History  . Marital status: Divorced    Spouse name: [name]  . Number of children: 2  . Years of education: 12th    Occupational History  . [name]    Social History Main Topics  . Smoking status: Current Every Day Smoker    Packs/day: 1.00    Years: 15.00    Types: Cigarettes  . Smokeless tobacco: Never Used  . Alcohol use No     Comment: Rarely  . Drug use: Unknown  . Sexual activity: Not Asked   Other Topics Concern  . None   Social History Narrative   Lives at home w/ his step-mother   Right-handed   Drinks about 2-3 Mt Dews a day   Past Surgical History:  Procedure Laterality Date  . PILONIDAL CYST DRAINAGE [name] 04/10/2017   Procedure: IRRIGATION AND DEBRIDEMENT BACK ABSCESS;  Surgeon: [name], MD;  Location: [facility];  Service: General;  Laterality: [name];  . THORACIC OUTLET SURGERY  2000  . WRIST FUSION  2002   Past Medical History:  Diagnosis Date  . Anxiety   . Chronic back pain   . Degenerative disk disease   . Depression   . Hypertension   . MRSA (methicillin resistant staph aureus) culture positive   . Peripheral neuropathy   . Pilonidal cyst    BP 123/79 (BP Location: Left Arm, Patient Position: Sitting, Cuff Size: Large)   Pulse 81   Resp 16   SpO2 93%   Opioid Risk Score:   Fall Risk Score:  `1  Depression screen PHQ 2/9  Depression screen [facility] 2/9 08/02/2017 07/12/2017 06/17/2017 05/20/2017 04/01/2017 03/03/2017  Decreased Interest 2 2 2  1 2 3   Down, Depressed, Hopeless 3 3 2 2 1 3   PHQ - 2 Score 5 5 4 3 3 6   Altered sleeping 3 3 3 2 2 3   Tired, decreased energy 3 3 3 3 1 3   Change in appetite 3 3 2 2 1 1   Feeling bad or failure about yourself  2 2 2 3 1 3   Trouble concentrating 3 2 3 3 2 3   Moving slowly or fidgety/restless 2 1 2 2 1 3   Suicidal thoughts 1 1 1 1  0 1  PHQ-9 Score 22 20 20 19 11 23   Difficult doing work/chores Extremely dIfficult Somewhat difficult Very difficult - - Very difficult    Review of Systems  HENT: Negative.   Eyes: Negative.   Respiratory: Positive for shortness of breath.   Cardiovascular: Negative.   Gastrointestinal: Negative.   Endocrine: Negative.   Genitourinary: Positive for difficulty urinating.  Musculoskeletal: Positive for back pain and gait problem.  Skin: Negative.   Allergic/Immunologic: Negative.   Neurological: Positive for dizziness, weakness and numbness.  Tingling  Psychiatric/Behavioral: Positive for confusion, dysphoric mood and suicidal ideas. The patient is nervous/anxious.   All other systems reviewed and are negative.      Objective:   Physical Exam  Constitutional: He is oriented to person, place, and time. He appears well-developed and well-nourished.  HENT:  Head: Normocephalic and atraumatic.  Eyes: Pupils are equal, round, and reactive to light. Conjunctivae and EOM are normal.  Cardiovascular: Normal rate, regular rhythm and normal heart sounds.  Exam reveals no friction rub.   No murmur heard. Pulmonary/Chest: Effort normal and breath sounds normal. No respiratory distress. He has no wheezes.  Abdominal: Soft. Bowel sounds are normal. He exhibits no distension. There is no tenderness.  Neurological: He is alert and oriented to person, place, and time. He displays atrophy. Coordination abnormal.  Motor strength is 5/5 bilateral deltoid, biceps, triceps, grip, hip flexor, knee extensor, ankle dorsi flexion, plantar flexor 4/5 at the toe flexors  and extensors.  There is a foot intrinsic atrophy in bilateral lower limbs  Patient is able to perform toe walk and heel walk. Some decreased balance with attempt at tandem gait. Absent sensation to pinprick in bilateral feet and ankle area. Has pinprick sensation at the mid leg to the knee that is impaired. Pinprick is impaired in bilateral hands and intact at bilateral elbows. Perception intact bilateral upper and lower limbs.   Psychiatric: His speech is normal. Thought content normal. His affect is blunt. He is withdrawn. Cognition and memory are normal. He exhibits a depressed mood.  Nursing note and vitals reviewed.         Assessment & Plan:  1. Axonal sensorimotor polyneuropathy, distal and symmetric causing pain. I do not think his lumbar spine degenerative disc and spondylosis is causing any significant nerve root compression  He gets good symptomatic relief with the Lyrica, however, it is causing enuresis as well as drowsiness during the day. We'll discontinue Start tramadol 50 mg 3 times a day and nortriptyline 25 mg daily at bedtime Do not plan any schedule 2 narcotic analgesics No benzodiazepines Physical medicine rehabilitation follow-up in 6 months Recommend psychology follow-up, we can make a referral to Dr Sima Matas  if he does not get in with a provider soon

## 2017-08-02 NOTE — Telephone Encounter (Signed)
Lyrica prescription signed by MD and faxed to [facility]. Receipt of confirmation received. Patient called and made aware.

## 2017-08-02 NOTE — Addendum Note (Signed)
Addended by: Gildardo Griffes on: 08/02/2017 09:07 AM   Modules accepted: Orders

## 2017-08-02 NOTE — Telephone Encounter (Signed)
Patient called and aware prescription refill in process for Lyrica. Will be faxed to [facility].

## 2017-08-11 ENCOUNTER — Encounter: Payer: Self-pay | Admitting: Adult Health

## 2017-08-11 ENCOUNTER — Ambulatory Visit (INDEPENDENT_AMBULATORY_CARE_PROVIDER_SITE_OTHER): Payer: Managed Care, Other (non HMO) | Admitting: Adult Health

## 2017-08-11 VITALS — BP 133/84 | HR 81 | Temp 98.1°F | Ht 76.0 in | Wt 341.2 lb

## 2017-08-11 DIAGNOSIS — J0100 Acute maxillary sinusitis, unspecified: Secondary | ICD-10-CM

## 2017-08-11 DIAGNOSIS — G8929 Other chronic pain: Secondary | ICD-10-CM

## 2017-08-11 DIAGNOSIS — F419 Anxiety disorder, unspecified: Secondary | ICD-10-CM

## 2017-08-11 DIAGNOSIS — I10 Essential (primary) hypertension: Secondary | ICD-10-CM | POA: Diagnosis not present

## 2017-08-11 DIAGNOSIS — F17200 Nicotine dependence, unspecified, uncomplicated: Secondary | ICD-10-CM

## 2017-08-11 DIAGNOSIS — F329 Major depressive disorder, single episode, unspecified: Secondary | ICD-10-CM

## 2017-08-11 DIAGNOSIS — M545 Low back pain, unspecified: Secondary | ICD-10-CM

## 2017-08-11 DIAGNOSIS — F32A Depression, unspecified: Secondary | ICD-10-CM

## 2017-08-11 MED ORDER — MONTELUKAST SODIUM 10 MG PO TABS: 10.0000 mg | ORAL_TABLET | Freq: Every day | ORAL | 3 refills | Status: DC

## 2017-08-11 MED ORDER — FLUTICASONE PROPIONATE 50 MCG/ACT NA SUSP: 2.0000 | Freq: Every day | NASAL | 6 refills | Status: DC

## 2017-08-11 MED ORDER — AMOXICILLIN-POT CLAVULANATE 875-125 MG PO TABS: 1.0000 | ORAL_TABLET | Freq: Two times a day (BID) | ORAL | 0 refills | Status: DC

## 2017-08-11 NOTE — Assessment & Plan Note (Addendum)
BP at goal 133/84, HR 81 Continue amlodipine 2.5mg  daily Reduce-stop tobacco use!

## 2017-08-11 NOTE — Assessment & Plan Note (Addendum)
Please take Augmentin twice daily for 10 days. Please stop Claritan/Allegra and start once daily Montelukast (Singulair). Increase water intake, strive for at least gallon/day. Reduce-stop tobacco use, please use Nicoderm patches- YOU CAN DO IT! If sx's persist after ABX completed, then please call clinic.

## 2017-08-11 NOTE — Assessment & Plan Note (Signed)
Please use Nicoderm patches as directed.  He denies need for refill at this time.

## 2017-08-11 NOTE — Progress Notes (Addendum)
Subjective:    Patient ID: Eduardo [name], male    DOB: Dec 03, 1970, 46 y.o.   MRN: [id number]  HPI:  Eduardo [name] is here with sinus pressure, copious green nasal drainage, frontal HA (2/10), L ear pain (9/10), and occasional non-productive cough with nocturnal wheezing- sx's occurring >6 weeks and worsening.  He continues to smoke pack/day and "really wants to quit" but to vocational stress and living with other smokers, it has been "impossible to even think about quitting".   He denies CP, however reports mild dyspnea with exertion and when coughing.   Of Note: 08/02/2017 Dr. Irean Hong Med for chronic lumbar back pain-   notes indicate that he was to stop the pregabalin (Lyrica) 300mg . He was started on Tramadol 50mg  TID and Nortriptyline 19m QHS to treat chronic pain.  Patient Care Team    Relationship Specialty Notifications Start End  Mina Marble D, NP PCP - General Family Medicine  03/03/17   Allyn Kenner, MD  Dermatology  04/06/17     Patient Active Problem List   Diagnosis Date Noted  . Anxiety and depression 08/11/2017  . Acute maxillary sinusitis 08/11/2017  . 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 Lumbar degenerative disc disease 08/02/2017  . Acute left ankle pain 07/12/2017  . Skin infection 06/03/2017  . Chronic folliculitis 32/20/2542  . [place] 04/10/2017  . Cellulitis and abscess of trunk s/p I&D 04/10/2017 04/06/2017  . Fever chills 04/06/2017  . History of MRSA infection 04/06/2017  . Health care maintenance 03/03/2017  . Neuropathy 09/28/2015  . Major depression 12/26/2012  . Generalized anxiety disorder 12/26/2012  . Opiate dependence ([place]) 12/24/2012  . Benzodiazepine dependence ([place]) 12/24/2012  . HTN (hypertension) 12/24/2012  . 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 OBESITY 09/09/2010  . TOBACCO ABUSE 09/09/2010  . LOW BACK PAIN, CHRONIC 09/09/2010     Past Medical History:  Diagnosis Date  . Anxiety   . Chronic back  pain   . Degenerative disk disease   . Depression   . Hypertension   . MRSA (methicillin resistant staph aureus) culture positive   . Peripheral neuropathy   . Pilonidal cyst      Past Surgical History:  Procedure Laterality Date  . PILONIDAL CYST DRAINAGE [name] 04/10/2017   Procedure: IRRIGATION AND DEBRIDEMENT BACK ABSCESS;  Surgeon: [name], MD;  Location: [facility];  Service: General;  Laterality: [name];  . THORACIC OUTLET SURGERY  2000  . WRIST FUSION  2002     Family History  Problem Relation Age of Onset  . Cancer Maternal Grandmother        breast, ovarian & colon  . Hypertension Mother   . Diabetes Mother   . Hypertension Father   . Heart disease Father   . Stroke Paternal Grandfather   . Neuropathy Neg Hx      History  Drug use: Unknown     History  Alcohol Use No    Comment: Rarely     History  Smoking Status  . Current Every Day Smoker  . Packs/day: 1.00  . Years: 15.00  . Types: Cigarettes  Smokeless Tobacco  . Never Used     Outpatient Encounter Prescriptions as of 08/11/2017  Medication Sig  . acetaminophen (TYLENOL) 650 MG CR tablet Take 650 mg by mouth every 6 (six) hours as needed for pain.  [name] amLODipine (NORVASC) 2.5 MG tablet Take 1 tablet (2.5 mg total) by mouth daily.  . diphenhydramine-acetaminophen (TYLENOL PM) 25-500 MG TABS  tablet You can use this at bedtime, instead of the regular Tylenol.    DO NOT TAKE MORE THAN 4000 MG OF TYLENOL PER DAY.  IT CAN HARM YOUR LIVER.  [name] mupirocin cream (BACTROBAN) 2 % Apply 1 application topically 2 (two) times daily. For 7 days  . nortriptyline (PAMELOR) 25 MG capsule Take 1 capsule (25 mg total) by mouth at bedtime. Stop trazodone  . pregabalin (LYRICA) 300 MG capsule Take 1 capsule (300 mg total) by mouth 2 (two) times daily.  . traMADol (ULTRAM) 50 MG tablet Take 1 tablet (50 mg total) by mouth every 8 (eight) hours as needed.  . [DISCONTINUED] fexofenadine (ALLEGRA) 60 MG tablet Take 60 mg  by mouth daily.  . [DISCONTINUED] loratadine (CLARITIN) 10 MG tablet Take 10 mg by mouth daily.  . [DISCONTINUED] nicotine (NICODERM CQ - DOSED IN MG/24 HOURS) 21 mg/24hr patch You can buy this over the counter.  It will help you to heal if you stop smoking.  [name] amoxicillin-clavulanate (AUGMENTIN) 875-125 MG tablet Take 1 tablet by mouth 2 (two) times daily.  . fluticasone (FLONASE) 50 MCG/ACT nasal spray Place 2 sprays into both nostrils daily.  . montelukast (SINGULAIR) 10 MG tablet Take 1 tablet (10 mg total) by mouth at bedtime.   No facility-administered encounter medications on file as of 08/11/2017.     Allergies: Patient has no known allergies.  Body mass index is 41.53 kg/m.  Blood pressure 133/84, pulse 81, temperature 98.1 F (36.7 C), temperature source Oral, height 6\' 4"  (1.93 m), weight (!) 341 lb 3.2 oz (154.8 kg).    Review of Systems  Constitutional: Positive for fatigue. Negative for activity change, appetite change, chills, diaphoresis, fever and unexpected weight change.  HENT: Positive for congestion, postnasal drip, rhinorrhea and sinus pressure. Negative for sinus pain.   Eyes: Negative for visual disturbance.  Respiratory: Positive for cough, chest tightness, shortness of breath and wheezing. Negative for stridor.   Cardiovascular: Negative for chest pain, palpitations and leg swelling.  Gastrointestinal: Negative for abdominal distention, abdominal pain, blood in stool, constipation, diarrhea, nausea and vomiting.  Endocrine: Negative for cold intolerance, heat intolerance, polydipsia, polyphagia and polyuria.  Genitourinary: Negative for difficulty urinating, flank pain and hematuria.  Musculoskeletal: Positive for arthralgias, gait problem, joint swelling, myalgias, neck pain and neck stiffness. Negative for back pain.  Neurological: Positive for headaches.  Hematological: Does not bruise/bleed easily.  Psychiatric/Behavioral: Positive for agitation,  decreased concentration, dysphoric mood and sleep disturbance. Negative for behavioral problems, confusion, hallucinations, self-injury and suicidal ideas. The patient is nervous/anxious. The patient is not hyperactive.        Objective:   Physical Exam  Constitutional: He is oriented to person, place, and time. He appears well-developed and well-nourished. No distress.  HENT:  Head: Normocephalic and atraumatic.  Right Ear: Hearing, external ear and ear canal normal. Tympanic membrane is erythematous and bulging. No decreased hearing is noted.  Left Ear: Hearing, external ear and ear canal normal. Tympanic membrane is erythematous and bulging. No decreased hearing is noted.  Nose: Mucosal edema, rhinorrhea and sinus tenderness present. Right sinus exhibits maxillary sinus tenderness. Right sinus exhibits no frontal sinus tenderness. Left sinus exhibits maxillary sinus tenderness. Left sinus exhibits no frontal sinus tenderness.  Mouth/Throat: Uvula is midline. Posterior oropharyngeal erythema present. No oropharyngeal exudate or tonsillar abscesses.  Eyes: Pupils are equal, round, and reactive to light. Conjunctivae are normal.  Neck: Normal range of motion. Neck supple.  Cardiovascular: Normal rate, regular rhythm,  normal heart sounds and intact distal pulses.   Pulmonary/Chest: Effort normal. No respiratory distress. He has wheezes in the right middle field and the left middle field. He has no rales. He exhibits no tenderness.  Wheezing will clear with cough  Lymphadenopathy:    He has no cervical adenopathy.  Neurological: He is alert and oriented to person, place, and time.  Skin: Skin is warm and dry. He is not diaphoretic.  Psychiatric: His speech is normal and behavior is normal. Judgment and thought content normal. His affect is blunt. Cognition and memory are normal.  Very flat affect  Nursing note and vitals reviewed.         Assessment & Plan:   1. Anxiety and depression    2. TOBACCO ABUSE   3. Acute maxillary sinusitis, recurrence not specified   4. Essential hypertension   5. Chronic bilateral low back pain without sciatica     HTN (hypertension) BP at goal 133/84, HR 81 Continue amlodipine 2.5mg  daily Reduce-stop tobacco use!  Acute maxillary sinusitis Please take Augmentin twice daily for 10 days. Please stop Claritan/Allegra and start once daily Montelukast (Singulair). Increase water intake, strive for at least gallon/day. Reduce-stop tobacco use, please use Nicoderm patches- YOU CAN DO IT! If sx's persist after ABX completed, then please call clinic.   Anxiety and depression Psychiatry referral placed again-PLEASE make an appt. Previous referrals placed 05/10/2017 and 05/20/2017     TOBACCO ABUSE Please use Nicoderm patches as directed.  He denies need for refill at this time.   LOW BACK PAIN, CHRONIC He was seen by Dr. Letta Pate of [facility] and notes indicate that he was to stop the pregabalin (Lyrica) 300mg . He was started on Tramadol 50mg  TID and Nortriptyline 59m QHS to treat chronic pain. However when reviewing medications he reported he was still taking pregabalin and he was very vague as to where he was getting medication filled. I advised him that he needs to follow tx plan as outlined by Dr. Letta Pate and he should make an OV with [facility] to discuss medication regime. He verbalized understanding and agreement.    FOLLOW-UP:  Return if symptoms worsen or fail to improve.

## 2017-08-11 NOTE — Assessment & Plan Note (Signed)
He was seen by Dr. Letta Pate of [facility] and notes indicate that he was to stop the pregabalin (Lyrica) 300mg . He was started on Tramadol 50mg  TID and Nortriptyline 39m QHS to treat chronic pain. However when reviewing medications he reported he was still taking pregabalin and he was very vague as to where he was getting medication filled. I advised him that he needs to follow tx plan as outlined by Dr. Letta Pate and he should make an OV with [facility] to discuss medication regime. He verbalized understanding and agreement.

## 2017-08-11 NOTE — Patient Instructions (Addendum)
Sinusitis, Adult Sinusitis is soreness and inflammation of your sinuses. Sinuses are hollow spaces in the bones around your face. Your sinuses are located:  Around your eyes.  In the middle of your forehead.  Behind your nose.  In your cheekbones.  Your sinuses and nasal passages are lined with a stringy fluid (mucus). Mucus normally drains out of your sinuses. When your nasal tissues become inflamed or swollen, the mucus can become trapped or blocked so air cannot flow through your sinuses. This allows bacteria, viruses, and funguses to grow, which leads to infection. Sinusitis can develop quickly and last for 7?10 days (acute) or for more than 12 weeks (chronic). Sinusitis often develops after a cold. What are the causes? This condition is caused by anything that creates swelling in the sinuses or stops mucus from draining, including:  Allergies.  Asthma.  Bacterial or viral infection.  Abnormally shaped bones between the nasal passages.  Nasal growths that contain mucus (nasal polyps).  Narrow sinus openings.  Pollutants, such as chemicals or irritants in the air.  A foreign object stuck in the nose.  A fungal infection. This is rare.  What increases the risk? The following factors may make you more likely to develop this condition:  Having allergies or asthma.  Having had a recent cold or respiratory tract infection.  Having structural deformities or blockages in your nose or sinuses.  Having a weak immune system.  Doing a lot of swimming or diving.  Overusing nasal sprays.  Smoking.  What are the signs or symptoms? The main symptoms of this condition are pain and a feeling of pressure around the affected sinuses. Other symptoms include:  Upper toothache.  Earache.  Headache.  Bad breath.  Decreased sense of smell and taste.  A cough that may get worse at night.  Fatigue.  Fever.  Thick drainage from your nose. The drainage is often green and  it may contain pus (purulent).  Stuffy nose or congestion.  Postnasal drip. This is when extra mucus collects in the throat or back of the nose.  Swelling and warmth over the affected sinuses.  Sore throat.  Sensitivity to light.  How is this diagnosed? This condition is diagnosed based on symptoms, a medical history, and a physical exam. To find out if your condition is acute or chronic, your health care provider may:  Look in your nose for signs of nasal polyps.  Tap over the affected sinus to check for signs of infection.  View the inside of your sinuses using an imaging device that has a light attached (endoscope).  If your health care provider suspects that you have chronic sinusitis, you may also:  Be tested for allergies.  Have a sample of mucus taken from your nose (nasal culture) and checked for bacteria.  Have a mucus sample examined to see if your sinusitis is related to an allergy.  If your sinusitis does not respond to treatment and it lasts longer than 8 weeks, you may have an MRI or CT scan to check your sinuses. These scans also help to determine how severe your infection is. In rare cases, a bone biopsy may be done to rule out more serious types of fungal sinus disease. How is this treated? Treatment for sinusitis depends on the cause and whether your condition is chronic or acute. If a virus is causing your sinusitis, your symptoms will go away on their own within 10 days. You may be given medicines to relieve your symptoms,   including:  Topical nasal decongestants. They shrink swollen nasal passages and let mucus drain from your sinuses.  Antihistamines. These drugs block inflammation that is triggered by allergies. This can help to ease swelling in your nose and sinuses.  Topical nasal corticosteroids. These are nasal sprays that ease inflammation and swelling in your nose and sinuses.  Nasal saline washes. These rinses can help to get rid of thick mucus in  your nose.  If your condition is caused by bacteria, you will be given an antibiotic medicine. If your condition is caused by a fungus, you will be given an antifungal medicine. Surgery may be needed to correct underlying conditions, such as narrow nasal passages. Surgery may also be needed to remove polyps. Follow these instructions at home: Medicines  Take, use, or apply over-the-counter and prescription medicines only as told by your health care provider. These may include nasal sprays.  If you were prescribed an antibiotic medicine, take it as told by your health care provider. Do not stop taking the antibiotic even if you start to feel better. Hydrate and Humidify  Drink enough water to keep your urine clear or pale yellow. Staying hydrated will help to thin your mucus.  Use a cool mist humidifier to keep the humidity level in your home above 50%.  Inhale steam for 10-15 minutes, 3-4 times a day or as told by your health care provider. You can do this in the bathroom while a hot shower is running.  Limit your exposure to cool or dry air. Rest  Rest as much as possible.  Sleep with your head raised (elevated).  Make sure to get enough sleep each night. General instructions  Apply a warm, moist washcloth to your face 3-4 times a day or as told by your health care provider. This will help with discomfort.  Wash your hands often with soap and water to reduce your exposure to viruses and other germs. If soap and water are not available, use hand sanitizer.  Do not smoke. Avoid being around people who are smoking (secondhand smoke).  Keep all follow-up visits as told by your health care provider. This is important. Contact a health care provider if:  You have a fever.  Your symptoms get worse.  Your symptoms do not improve within 10 days. Get help right away if:  You have a severe headache.  You have persistent vomiting.  You have pain or swelling around your face or  eyes.  You have vision problems.  You develop confusion.  Your neck is stiff.  You have trouble breathing. This information is not intended to replace advice given to you by your health care provider. Make sure you discuss any questions you have with your health care provider. Document Released: 10/05/2005 Document Revised: 05/31/2016 Document Reviewed: 07/31/2015 Elsevier Interactive Patient Education  2017 [employer].   Generalized Anxiety Disorder, Adult Generalized anxiety disorder (GAD) is a mental health disorder. People with this condition constantly worry about everyday events. Unlike normal anxiety, worry related to GAD is not triggered by a specific event. These worries also do not fade or get better with time. GAD interferes with life functions, including relationships, work, and school. GAD can vary from mild to severe. People with severe GAD can have intense waves of anxiety with physical symptoms (panic attacks). What are the causes? The exact cause of GAD is not known. What increases the risk? This condition is more likely to develop in:  Women.  People who have a  family history of anxiety disorders.  People who are very shy.  People who experience very stressful life events, such as the death of a loved one.  People who have a very stressful family environment.  What are the signs or symptoms? People with GAD often worry excessively about many things in their lives, such as their health and family. They may also be overly concerned about:  Doing well at work.  Being on time.  Natural disasters.  Friendships.  Physical symptoms of GAD include:  Fatigue.  Muscle tension or having muscle twitches.  Trembling or feeling shaky.  Being easily startled.  Feeling like your heart is pounding or racing.  Feeling out of breath or like you cannot take a deep breath.  Having trouble falling asleep or staying asleep.  Sweating.  Nausea, diarrhea, or  irritable bowel syndrome (IBS).  Headaches.  Trouble concentrating or remembering facts.  Restlessness.  Irritability.  How is this diagnosed? Your health care provider can diagnose GAD based on your symptoms and medical history. You will also have a physical exam. The health care provider will ask specific questions about your symptoms, including how severe they are, when they started, and if they come and go. Your health care provider may ask you about your use of alcohol or drugs, including prescription medicines. Your health care provider may refer you to a mental health specialist for further evaluation. Your health care provider will do a thorough examination and may perform additional tests to rule out other possible causes of your symptoms. To be diagnosed with GAD, a person must have anxiety that:  Is out of his or her control.  Affects several different aspects of his or her life, such as work and relationships.  Causes distress that makes him or her unable to take part in normal activities.  Includes at least three physical symptoms of GAD, such as restlessness, fatigue, trouble concentrating, irritability, muscle tension, or sleep problems.  Before your health care provider can confirm a diagnosis of GAD, these symptoms must be present more days than they are not, and they must last for six months or longer. How is this treated? The following therapies are usually used to treat GAD:  Medicine. Antidepressant medicine is usually prescribed for long-term daily control. Antianxiety medicines may be added in severe cases, especially when panic attacks occur.  Talk therapy (psychotherapy). Certain types of talk therapy can be helpful in treating GAD by providing support, education, and guidance. Options include: ? Cognitive behavioral therapy (CBT). People learn coping skills and techniques to ease their anxiety. They learn to identify unrealistic or negative thoughts and  behaviors and to replace them with positive ones. ? Acceptance and commitment therapy (ACT). This treatment teaches people how to be mindful as a way to cope with unwanted thoughts and feelings. ? Biofeedback. This process trains you to manage your body's response (physiological response) through breathing techniques and relaxation methods. You will work with a therapist while machines are used to monitor your physical symptoms.  Stress management techniques. These include yoga, meditation, and exercise.  A mental health specialist can help determine which treatment is best for you. Some people see improvement with one type of therapy. However, other people require a combination of therapies. Follow these instructions at home:  Take over-the-counter and prescription medicines only as told by your health care provider.  Try to maintain a normal routine.  Try to anticipate stressful situations and allow extra time to manage them.  Practice  any stress management or self-calming techniques as taught by your health care provider.  Do not punish yourself for setbacks or for not making progress.  Try to recognize your accomplishments, even if they are small.  Keep all follow-up visits as told by your health care provider. This is important. Contact a health care provider if:  Your symptoms do not get better.  Your symptoms get worse.  You have signs of depression, such as: ? A persistently sad, cranky, or irritable mood. ? Loss of enjoyment in activities that used to bring you joy. ? Change in weight or eating. ? Changes in sleeping habits. ? Avoiding friends or family members. ? Loss of energy for normal tasks. ? Feelings of guilt or worthlessness. Get help right away if:  You have serious thoughts about hurting yourself or others. If you ever feel like you may hurt yourself or others, or have thoughts about taking your own life, get help right away. You can go to your nearest  emergency department or call:  Your local emergency services (911 in the [place]  A suicide crisis helpline, such as the [facility] at 343-678-0419. This is open 24 hours a day.  Summary  Generalized anxiety disorder (GAD) is a mental health disorder that involves worry that is not triggered by a specific event.  People with GAD often worry excessively about many things in their lives, such as their health and family.  GAD may cause physical symptoms such as restlessness, trouble concentrating, sleep problems, frequent sweating, nausea, diarrhea, headaches, and trembling or muscle twitching.  A mental health specialist can help determine which treatment is best for you. Some people see improvement with one type of therapy. However, other people require a combination of therapies. This information is not intended to replace advice given to you by your health care provider. Make sure you discuss any questions you have with your health care provider. Document Released: 01/30/2013 Document Revised: 08/25/2016 Document Reviewed: 08/25/2016 Elsevier Interactive Patient Education  2018 [employer].  Please take Augmentin twice daily for 10 days. Please stop Claritan/Allegra and start once daily Montelukast (Singulair). Increase water intake, strive for at least gallon/day. Reduce-stop tobacco use, please use Nicoderm patches- YOU CAN DO IT! Psychiatry referral placed-PLEASE make an appt. If sinus symptoms persist after antibiotic completed, please call clinic. Please discuss medication plan with Dr. Irean Hong Med- on whether you should continue pregabalin (Lyrica) NICE TO SEE YOU!

## 2017-08-11 NOTE — Assessment & Plan Note (Addendum)
Psychiatry referral placed again-PLEASE make an appt. Previous referrals placed 05/10/2017 and 05/20/2017

## 2017-08-17 ENCOUNTER — Encounter (HOSPITAL_COMMUNITY): Payer: Self-pay | Admitting: Psychiatry

## 2017-08-17 ENCOUNTER — Ambulatory Visit (INDEPENDENT_AMBULATORY_CARE_PROVIDER_SITE_OTHER): Payer: Managed Care, Other (non HMO) | Admitting: Psychiatry

## 2017-08-17 VITALS — BP 137/89 | HR 82 | Ht 76.0 in | Wt 341.4 lb

## 2017-08-17 DIAGNOSIS — G4710 Hypersomnia, unspecified: Secondary | ICD-10-CM | POA: Diagnosis not present

## 2017-08-17 DIAGNOSIS — G4730 Sleep apnea, unspecified: Secondary | ICD-10-CM | POA: Diagnosis not present

## 2017-08-17 DIAGNOSIS — F332 Major depressive disorder, recurrent severe without psychotic features: Secondary | ICD-10-CM

## 2017-08-17 MED ORDER — NORTRIPTYLINE HCL 25 MG PO CAPS: ORAL_CAPSULE | ORAL | 2 refills | Status: DC
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
## 2017-08-26 ENCOUNTER — Other Ambulatory Visit: Payer: Self-pay

## 2017-08-26 ENCOUNTER — Telehealth (HOSPITAL_COMMUNITY): Payer: Self-pay

## 2017-08-26 DIAGNOSIS — G609 Hereditary and idiopathic neuropathy, unspecified: Secondary | ICD-10-CM

## 2017-08-26 DIAGNOSIS — E538 Deficiency of other specified B group vitamins: Secondary | ICD-10-CM

## 2017-08-26 NOTE — Telephone Encounter (Signed)
Patient called and said he needs to come in sooner to talk about his ADD. He currently has an appointment in December. Would you like me to move appointment up, or do you want to call him? Please advise, thank you

## 2017-08-26 NOTE — Telephone Encounter (Signed)
May Rx Lyrica 300mg  BID #60 1 RF  May try taking one tylenol with the tramadol.

## 2017-08-26 NOTE — Telephone Encounter (Signed)
Patient called stating that he needs a refill on Lyrica that another Dr ordered. He states that he wants Dr. Read Drivers to refill it because his other Dr. Cherie Dark him loose. He also stated that the medication for pain that Dr. Read Drivers prescribed for him is giving him bad headaches.

## 2017-08-27 MED ORDER — PREGABALIN 300 MG PO CAPS: 300.0000 mg | ORAL_CAPSULE | Freq: Two times a day (BID) | ORAL | 0 refills | Status: DC

## 2017-08-27 NOTE — Telephone Encounter (Signed)
Contacted Eduardo [name] and informed that Lyrica will be called in and that the doctors recommendation was to take a tylenol tablet along with tramadol to ease the headache pain.  Eduardo [name] had new visit on 08/02/2017 and the clinic visit note along with the AVS did not specify follow up instructions with Dr. Letta Pate. A follow up appointment has not been set. Please advise on follow up expectation

## 2017-08-27 NOTE — Telephone Encounter (Signed)
He needs to complete his sleep study before any medication changes.  No need to come early.

## 2017-08-30 NOTE — Telephone Encounter (Signed)
I called the patient and let him know, he states that they are currently checking his insurance and will call him with an appointment.

## 2017-09-16 ENCOUNTER — Encounter: Payer: Self-pay | Admitting: Adult Health

## 2017-10-13 NOTE — Progress Notes (Signed)
Subjective:    Patient ID: Eduardo [name], male    DOB: 1971/10/07, 46 y.o.   MRN: [id number]  HPI:  Eduardo [name] is here for bil upper extremity numbness that initially started 2015, he denies acute accident/injury prior to onset of sx's.  He reports numbness has been increasing in frequency/intensity the last 6-8 months. Numbness occurs daily and he is unable to estimate duration of sx's when it occurs - ? Position change will resolve numbness, "but it seems to take longer and longer" (about 5-10 mins). He feels that sx's are more sig L > R, however he is R hand dominant. NCV with EMG 02/2017, reviewed at length with pt and test conclusion "this is a normal exam". Of Note: He is being treated for neuropathy by local pain clinic with pregabalin 300mg  BID and tramadol 50mg  PRN Q8H.     Patient Care Team    Relationship Specialty Notifications Start End  Mina Marble D, NP PCP - General Family Medicine  03/03/17   Allyn Kenner, MD  Dermatology  04/06/17     Patient Active Problem List   Diagnosis Date Noted  . Anxiety and depression 08/11/2017  . Acute maxillary sinusitis 08/11/2017  . 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 Lumbar degenerative disc disease 08/02/2017  . Acute left ankle pain 07/12/2017  . Skin infection 06/03/2017  . Chronic folliculitis 61/60/7371  . [place] 04/10/2017  . Cellulitis and abscess of trunk s/p I&D 04/10/2017 04/06/2017  . Fever chills 04/06/2017  . History of MRSA infection 04/06/2017  . Health care maintenance 03/03/2017  . Neuropathy 09/28/2015  . Major depression 12/26/2012  . Generalized anxiety disorder 12/26/2012  . Opiate dependence ([place]) 12/24/2012  . Benzodiazepine dependence ([place]) 12/24/2012  . HTN (hypertension) 12/24/2012  . 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 OBESITY 09/09/2010  . TOBACCO ABUSE 09/09/2010  . LOW BACK PAIN, CHRONIC 09/09/2010     Past Medical History:  Diagnosis Date  . Anxiety   .  Chronic back pain   . Degenerative disk disease   . Depression   . Hypertension   . MRSA (methicillin resistant staph aureus) culture positive   . Peripheral neuropathy   . Pilonidal cyst      Past Surgical History:  Procedure Laterality Date  . PILONIDAL CYST DRAINAGE [name] 04/10/2017   Procedure: IRRIGATION AND DEBRIDEMENT BACK ABSCESS;  Surgeon: [name], MD;  Location: [facility];  Service: General;  Laterality: [name];  . THORACIC OUTLET SURGERY  2000  . WRIST FUSION  2002     Family History  Problem Relation Age of Onset  . Cancer Maternal Grandmother        breast, ovarian & colon  . Hypertension Mother   . Diabetes Mother   . Hypertension Father   . Heart disease Father   . Stroke Paternal Grandfather   . Neuropathy Neg Hx      Social History   Substance and Sexual Activity  Drug Use No     Social History   Substance and Sexual Activity  Alcohol Use No  . Alcohol/week: 0.0 oz   Comment: Rarely     Social History   Tobacco Use  Smoking Status Current Every Day Smoker  . Packs/day: 1.00  . Years: 15.00  . Pack years: 15.00  . Types: Cigarettes  Smokeless Tobacco Never Used     Outpatient Encounter Medications as of 10/14/2017  Medication Sig  . acetaminophen (TYLENOL) 650 MG CR tablet Take 650 mg  by mouth every 6 (six) hours as needed for pain.  [name] amLODipine (NORVASC) 2.5 MG tablet Take 1 tablet (2.5 mg total) by mouth daily.  . diphenhydramine-acetaminophen (TYLENOL PM) 25-500 MG TABS tablet You can use this at bedtime, instead of the regular Tylenol.    DO NOT TAKE MORE THAN 4000 MG OF TYLENOL PER DAY.  IT CAN HARM YOUR LIVER.  . fluticasone (FLONASE) 50 MCG/ACT nasal spray Place 2 sprays into both nostrils daily.  . montelukast (SINGULAIR) 10 MG tablet Take 1 tablet (10 mg total) by mouth at bedtime.  . nortriptyline (PAMELOR) 25 MG capsule 50 mg nightly for 2 weeks, then increase to 75 mg nightly (3 capsules)  . pregabalin (LYRICA) 300 MG  capsule Take 1 capsule (300 mg total) 2 (two) times daily by mouth.  . traMADol (ULTRAM) 50 MG tablet Take 1 tablet (50 mg total) by mouth every 8 (eight) hours as needed.  . [DISCONTINUED] amoxicillin-clavulanate (AUGMENTIN) 875-125 MG tablet Take 1 tablet by mouth 2 (two) times daily.  . [DISCONTINUED] mupirocin cream (BACTROBAN) 2 % Apply 1 application topically 2 (two) times daily. For 7 days (Patient not taking: Reported on 08/17/2017)   No facility-administered encounter medications on file as of 10/14/2017.     Allergies: Pollen extract  Body mass index is 44.21 kg/m.  Blood pressure 121/84, pulse 84, height 6\' 4"  (1.93 m), weight (!) 363 lb 3.2 oz (164.7 kg), SpO2 93 %.    Review of Systems  Constitutional: Positive for fatigue. Negative for activity change, appetite change, chills, diaphoresis, fever and unexpected weight change.  Respiratory: Negative for cough, chest tightness, shortness of breath, wheezing and stridor.   Cardiovascular: Negative for chest pain, palpitations and leg swelling.  Musculoskeletal: Positive for arthralgias, back pain and myalgias. Negative for gait problem.  Neurological: Positive for numbness. Negative for dizziness.  Hematological: Does not bruise/bleed easily.  Psychiatric/Behavioral: Positive for sleep disturbance.       Objective:   Physical Exam  Constitutional: He is oriented to person, place, and time. He appears well-developed and well-nourished. No distress.  HENT:  Head: Normocephalic and atraumatic.  Right Ear: External ear normal.  Left Ear: External ear normal.  Eyes: Conjunctivae are normal. Pupils are equal, round, and reactive to light.  Cardiovascular: Normal rate, normal heart sounds and intact distal pulses.  No murmur heard. Pulmonary/Chest: Effort normal and breath sounds normal. No respiratory distress. He has no wheezes. He has no rales. He exhibits no tenderness.  Musculoskeletal: Normal range of motion. He  exhibits no edema or tenderness.       Right shoulder: Normal.       Left shoulder: Normal.       Cervical back: Normal.  Neurological: He is alert and oriented to person, place, and time. He displays normal reflexes. He exhibits normal muscle tone. Coordination normal.  Reflex Scores:      Tricep reflexes are 2+ on the right side and 2+ on the left side.      Bicep reflexes are 2+ on the right side and 2+ on the left side.      Brachioradialis reflexes are 2+ on the right side and 2+ on the left side. Skin: Skin is warm and dry. No rash noted. He is not diaphoretic. No erythema. No pallor.  Psychiatric: He has a normal mood and affect. His behavior is normal. Judgment and thought content normal.          Assessment & Plan:   1.  Health care maintenance   2. Chronic idiopathic axonal polyneuropathy     Health care maintenance Increase water intake, strive for at least 150 oz/day. Please call your Neurologist to discuss worsening upper extremity numbness. Please perform neck exercises daily. Start regular exercise program, which will help with weight loss and pain control. Please schedule full annual physical within the next month.  Chronic idiopathic axonal polyneuropathy 02/2017 NCV with EMG- normal Please call your Neurologist to discuss worsening upper extremity numbness. Please perform neck exercises daily. Start regular exercise program, which will help with weight loss and pain control.     FOLLOW-UP:  Return in about 4 weeks (around 11/11/2017) for CPE.

## 2017-10-14 ENCOUNTER — Ambulatory Visit (INDEPENDENT_AMBULATORY_CARE_PROVIDER_SITE_OTHER): Payer: BC Managed Care – PPO | Admitting: Adult Health

## 2017-10-14 ENCOUNTER — Encounter: Payer: Self-pay | Admitting: Adult Health

## 2017-10-14 DIAGNOSIS — G609 Hereditary and idiopathic neuropathy, unspecified: Secondary | ICD-10-CM

## 2017-10-14 DIAGNOSIS — Z0000 Encounter for general adult medical examination without abnormal findings: Secondary | ICD-10-CM

## 2017-10-14 NOTE — Assessment & Plan Note (Signed)
02/2017 NCV with EMG- normal Please call your Neurologist to discuss worsening upper extremity numbness. Please perform neck exercises daily. Start regular exercise program, which will help with weight loss and pain control.

## 2017-10-14 NOTE — Patient Instructions (Addendum)
Heart-Healthy Eating Plan Many factors influence your heart health, including eating and exercise habits. Heart (coronary) risk increases with abnormal blood fat (lipid) levels. Heart-healthy meal planning includes limiting unhealthy fats, increasing healthy fats, and making other small dietary changes. This includes maintaining a healthy body weight to help keep lipid levels within a normal range. What is my plan? Your health care provider recommends that you:  Get no more than ___25___% of the total calories in your daily diet from fat.  Limit your intake of saturated fat to less than ____5___% of your total calories each day.  Limit the amount of cholesterol in your diet to less than __300___ mg per day.  What types of fat should I choose?  Choose healthy fats more often. Choose monounsaturated and polyunsaturated fats, such as olive oil and canola oil, flaxseeds, walnuts, almonds, and seeds.  Eat more omega-3 fats. Good choices include salmon, mackerel, sardines, tuna, flaxseed oil, and ground flaxseeds. Aim to eat fish at least two times each week.  Limit saturated fats. Saturated fats are primarily found in animal products, such as meats, butter, and cream. Plant sources of saturated fats include palm oil, palm kernel oil, and coconut oil.  Avoid foods with partially hydrogenated oils in them. These contain trans fats. Examples of foods that contain trans fats are stick margarine, some tub margarines, cookies, crackers, and other baked goods. What general guidelines do I need to follow?  Check food labels carefully to identify foods with trans fats or high amounts of saturated fat.  Fill one half of your plate with vegetables and green salads. Eat 4-5 servings of vegetables per day. A serving of vegetables equals 1 cup of raw leafy vegetables,  cup of raw or cooked cut-up vegetables, or  cup of vegetable juice.  Fill one fourth of your plate with whole grains. Look for the word  "whole" as the first word in the ingredient list.  Fill one fourth of your plate with lean protein foods.  Eat 4-5 servings of fruit per day. A serving of fruit equals one medium whole fruit,  cup of dried fruit,  cup of fresh, frozen, or canned fruit, or  cup of 100% fruit juice.  Eat more foods that contain soluble fiber. Examples of foods that contain this type of fiber are apples, broccoli, carrots, beans, peas, and barley. Aim to get 20-30 g of fiber per day.  Eat more home-cooked food and less restaurant, buffet, and fast food.  Limit or avoid alcohol.  Limit foods that are high in starch and sugar.  Avoid fried foods.  Cook foods by using methods other than frying. Baking, boiling, grilling, and broiling are all great options. Other fat-reducing suggestions include: ? Removing the skin from poultry. ? Removing all visible fats from meats. ? Skimming the fat off of stews, soups, and gravies before serving them. ? Steaming vegetables in water or broth.  Lose weight if you are overweight. Losing just 5-10% of your initial body weight can help your overall health and prevent diseases such as diabetes and heart disease.  Increase your consumption of nuts, legumes, and seeds to 4-5 servings per week. One serving of dried beans or legumes equals  cup after being cooked, one serving of nuts equals 1 ounces, and one serving of seeds equals  ounce or 1 tablespoon.  You may need to monitor your salt (sodium) intake, especially if you have high blood pressure. Talk with your health care provider or dietitian to get  more information about reducing sodium. What foods can I eat? Grains  Breads, including [place], white, pita, wheat, raisin, rye, oatmeal, and [place]. Tortillas that are neither fried nor made with lard or trans fat. Low-fat rolls, including hotdog and hamburger buns and English muffins. Biscuits. Muffins. Waffles. Pancakes. Light popcorn. Whole-grain cereals. Flatbread.  Melba toast. Pretzels. Breadsticks. Rusks. Low-fat snacks and crackers, including oyster, saltine, matzo, graham, animal, and rye. Rice and pasta, including brown rice and those that are made with whole wheat. Vegetables All vegetables. Fruits All fruits, but limit coconut. Meats and Other Protein Sources Lean, well-trimmed beef, veal, pork, and lamb. Chicken and [place] without skin. All fish and shellfish. Wild duck, rabbit, pheasant, and venison. Egg whites or low-cholesterol egg substitutes. Dried beans, peas, lentils, and tofu.Seeds and most nuts. Dairy Low-fat or nonfat cheeses, including ricotta, string, and mozzarella. Skim or 1% milk that is liquid, powdered, or evaporated. Buttermilk that is made with low-fat milk. Nonfat or low-fat yogurt. Beverages Mineral water. Diet carbonated beverages. Sweets and Desserts Sherbets and fruit ices. Honey, jam, marmalade, jelly, and syrups. Meringues and gelatins. Pure sugar candy, such as hard candy, jelly beans, gumdrops, mints, marshmallows, and small amounts of dark chocolate. [employer] Eat all sweets and desserts in moderation. Fats and Oils Nonhydrogenated (trans-free) margarines. Vegetable oils, including soybean, sesame, sunflower, olive, peanut, safflower, corn, canola, and cottonseed. Salad dressings or mayonnaise that are made with a vegetable oil. Limit added fats and oils that you use for cooking, baking, salads, and as spreads. Other Cocoa powder. Coffee and tea. All seasonings and condiments. The items listed above may not be a complete list of recommended foods or beverages. Contact your dietitian for more options. What foods are not recommended? Grains Breads that are made with saturated or trans fats, oils, or whole milk. Croissants. Butter rolls. Cheese breads. Sweet rolls. Donuts. Buttered popcorn. Chow mein noodles. High-fat crackers, such as cheese or butter crackers. Meats and Other Protein Sources Fatty meats, such  as hotdogs, short ribs, sausage, spareribs, bacon, ribeye roast or steak, and mutton. High-fat deli meats, such as salami and bologna. Caviar. Domestic duck and goose. Organ meats, such as kidney, liver, sweetbreads, brains, gizzard, chitterlings, and heart. Dairy Cream, sour cream, cream cheese, and creamed cottage cheese. Whole milk cheeses, including blue (bleu), Monte[place], [place], [place], [place], [place], [place], [place], [place], and [place]. Whole or 2% milk that is liquid, evaporated, or condensed. Whole buttermilk. Cream sauce or high-fat cheese sauce. Yogurt that is made from whole milk. Beverages Regular sodas and drinks with added sugar. Sweets and Desserts Frosting. Pudding. Cookies. Cakes other than angel food cake. Candy that has milk chocolate or white chocolate, hydrogenated fat, butter, coconut, or unknown ingredients. Buttered syrups. Full-fat ice cream or ice cream drinks. Fats and Oils Gravy that has suet, meat fat, or shortening. Cocoa butter, hydrogenated oils, palm oil, coconut oil, palm kernel oil. These can often be found in baked products, candy, fried foods, nondairy creamers, and whipped toppings. Solid fats and shortenings, including bacon fat, salt pork, lard, and butter. Nondairy cream substitutes, such as coffee creamers and sour cream substitutes. Salad dressings that are made of unknown oils, cheese, or sour cream. The items listed above may not be a complete list of foods and beverages to avoid. Contact your dietitian for more information. This information is not intended to replace advice given to you by your health care provider. Make sure you discuss any questions you have with your health care  provider. Document Released: 07/14/2008 Document Revised: 04/24/2016 Document Reviewed: 03/29/2014 Elsevier Interactive Patient Education  2018 [place].   Neck Exercises Neck exercises can be important for many reasons:  They can help you to improve and  maintain flexibility in your neck. This can be especially important as you age.  They can help to make your neck stronger. This can make movement easier.  They can reduce or prevent neck pain.  They may help your upper back.  Ask your health care provider which neck exercises would be best for you. Exercises Neck Press Repeat this exercise 10 times. Do it first thing in the morning and right before bed or as told by your health care provider. 1. Lie on your back on a firm bed or on the floor with a pillow under your head. 2. Use your neck muscles to push your head down on the pillow and straighten your spine. 3. Hold the position as well as you can. Keep your head facing up and your chin tucked. 4. Slowly count to 5 while holding this position. 5. Relax for a few seconds. Then repeat.  Isometric Strengthening Do a full set of these exercises 2 times a day or as told by your health care provider. 1. Sit in a supportive chair and place your hand on your forehead. 2. Push forward with your head and neck while pushing back with your hand. Hold for 10 seconds. 3. Relax. Then repeat the exercise 3 times. 4. Next, do thesequence again, this time putting your hand against the back of your head. Use your head and neck to push backward against the hand pressure. 5. Finally, do the same exercise on either side of your head, pushing sideways against the pressure of your hand.  Prone Head Lifts Repeat this exercise 5 times. Do this 2 times a day or as told by your health care provider. 1. Lie face-down, resting on your elbows so that your chest and upper back are raised. 2. Start with your head facing downward, near your chest. Position your chin either on or near your chest. 3. Slowly lift your head upward. Lift until you are looking straight ahead. Then continue lifting your head as far back as you can stretch. 4. Hold your head up for 5 seconds. Then slowly lower it to your starting  position.  Supine Head Lifts Repeat this exercise 8-10 times. Do this 2 times a day or as told by your health care provider. 1. Lie on your back, bending your knees to point to the ceiling and keeping your feet flat on the floor. 2. Lift your head slowly off the floor, raising your chin toward your chest. 3. Hold for 5 seconds. 4. Relax and repeat.  Scapular Retraction Repeat this exercise 5 times. Do this 2 times a day or as told by your health care provider. 1. Stand with your arms at your sides. Look straight ahead. 2. Slowly pull both shoulders backward and downward until you feel a stretch between your shoulder blades in your upper back. 3. Hold for 10-30 seconds. 4. Relax and repeat.  Contact a health care provider if:  Your neck pain or discomfort gets much worse when you do an exercise.  Your neck pain or discomfort does not improve within 2 hours after you exercise. If you have any of these problems, stop exercising right away. Do not do the exercises again unless your health care provider says that you can. Get help right away if:  You develop sudden, severe neck pain. If this happens, stop exercising right away. Do not do the exercises again unless your health care provider says that you can. Exercises Neck Stretch  Repeat this exercise 3-5 times. 1. Do this exercise while standing or while sitting in a chair. 2. Place your feet flat on the floor, shoulder-width apart. 3. Slowly turn your head to the right. Turn it all the way to the right so you can look over your right shoulder. Do not tilt or tip your head. 4. Hold this position for 10-30 seconds. 5. Slowly turn your head to the left, to look over your left shoulder. 6. Hold this position for 10-30 seconds.  Neck Retraction Repeat this exercise 8-10 times. Do this 3-4 times a day or as told by your health care provider. 1. Do this exercise while standing or while sitting in a sturdy chair. 2. Look straight ahead.  Do not bend your neck. 3. Use your fingers to push your chin backward. Do not bend your neck for this movement. Continue to face straight ahead. If you are doing the exercise properly, you will feel a slight sensation in your throat and a stretch at the back of your neck. 4. Hold the stretch for 1-2 seconds. Relax and repeat.  This information is not intended to replace advice given to you by your health care provider. Make sure you discuss any questions you have with your health care provider. Document Released: 09/16/2015 Document Revised: 03/12/2016 Document Reviewed: 04/15/2015 Elsevier Interactive Patient Education  2018 [employer].  Increase water intake, strive for at least 150 oz/day. Please call your Neurologist to discuss worsening upper extremity numbness. Please perform neck exercises daily. Start regular exercise program, which will help with weight loss and pain control. Please schedule full annual physical within the next month. NICE TO SEE YOU!

## 2017-10-14 NOTE — Assessment & Plan Note (Signed)
Increase water intake, strive for at least 150 oz/day. Please call your Neurologist to discuss worsening upper extremity numbness. Please perform neck exercises daily. Start regular exercise program, which will help with weight loss and pain control. Please schedule full annual physical within the next month.

## 2017-11-10 ENCOUNTER — Ambulatory Visit (HOSPITAL_COMMUNITY): Payer: Self-pay | Admitting: Psychiatry

## 2017-11-10 NOTE — Progress Notes (Signed)
Subjective:    Patient ID: Eduardo [name], male    DOB: 31-Jan-1971, 47 y.o.   MRN: [id number]  HPI:  Eduardo [name] is here for CPE. He reports medication compliance, denies SE. He continues to have extremity numbness/tingling and it is managed with Pregabalalin 300mg  BID, he has tried Cymbalta in past without sx relief. He has several complaints today 1) Snoring 2) Difficultly breathing through nares 3) R scrotal "knot"- he noticed 6-8 months ago, not tender to the touch 4) L scrotal pain- that worsens with palpation and lifting 5) Umbilical hernia- he noticed 6-8 months ago He estimates to drink 30-40 oz water/day He eats only one large meal in the evening He walks 2-3 miles/day at work M-F He has lost >20 lbs since last OV in Dec 2018 He continues to smoke pack/day, he still has extra Nicoderm patches at home. Fasting labs drawn today.  PHQ reviewed - denies thoughts of harming himself/others. He denies ETOH/durg use, last narcotic use was in 2014 He denies any hx of arrest/assualt/DUI- he is applying for concealed carry license    Patient Care Team    Relationship Specialty Notifications Start End  Eduardo Marble D, NP PCP - General Family Medicine  03/03/17   Eduardo Kenner, MD  Dermatology  04/06/17     Patient Active Problem List   Diagnosis Date Noted  . Anxiety and depression 08/11/2017  . Acute maxillary sinusitis 08/11/2017  . 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 Lumbar degenerative disc disease 08/02/2017  . Acute left ankle pain 07/12/2017  . Skin infection 06/03/2017  . Chronic folliculitis 40/98/1191  . [place] 04/10/2017  . Cellulitis and abscess of trunk s/p I&[name] 04/10/2017 04/06/2017  . Fever chills 04/06/2017  . History of MRSA infection 04/06/2017  . Health care maintenance 03/03/2017  . Neuropathy 09/28/2015  . Major depression 12/26/2012  . Generalized anxiety disorder 12/26/2012  . Opiate dependence ([place]) 12/24/2012  .  Benzodiazepine dependence ([place]) 12/24/2012  . HTN (hypertension) 12/24/2012  . 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 OBESITY 09/09/2010  . TOBACCO ABUSE 09/09/2010  . LOW BACK PAIN, CHRONIC 09/09/2010     Past Medical History:  Diagnosis Date  . Anxiety   . Chronic back pain   . Degenerative disk disease   . Depression   . Hypertension   . MRSA (methicillin resistant staph aureus) culture positive   . Peripheral neuropathy   . Pilonidal cyst      Past Surgical History:  Procedure Laterality Date  . PILONIDAL CYST DRAINAGE [name] 04/10/2017   Procedure: IRRIGATION AND DEBRIDEMENT BACK ABSCESS;  Surgeon: [name], MD;  Location: [facility];  Service: General;  Laterality: [name];  . THORACIC OUTLET SURGERY  2000  . WRIST FUSION  2002     Family History  Problem Relation Age of Onset  . Cancer Maternal Grandmother        breast, ovarian & colon  . Hypertension Mother   . Diabetes Mother   . Hypertension Father   . Heart disease Father   . Stroke Paternal Grandfather   . Neuropathy Neg Hx      Social History   Substance and Sexual Activity  Drug Use No     Social History   Substance and Sexual Activity  Alcohol Use No  . Alcohol/week: 0.0 oz   Comment: Rarely     Social History   Tobacco Use  Smoking Status Current Every Day Smoker  . Packs/day: 1.00  . Years:  15.00  . Pack years: 15.00  . Types: Cigarettes  Smokeless Tobacco Never Used     Outpatient Encounter Medications as of 11/11/2017  Medication Sig  . acetaminophen (TYLENOL) 650 MG CR tablet Take 650 mg by mouth every 6 (six) hours as needed for pain.  [name] amLODipine (NORVASC) 2.5 MG tablet Take 1 tablet (2.5 mg total) by mouth daily.  . diphenhydramine-acetaminophen (TYLENOL PM) 25-500 MG TABS tablet You can use this at bedtime, instead of the regular Tylenol.    DO NOT TAKE MORE THAN 4000 MG OF TYLENOL PER DAY.  IT CAN HARM YOUR LIVER.  . fluticasone (FLONASE) 50 MCG/ACT nasal spray Place 2  sprays into both nostrils daily.  . montelukast (SINGULAIR) 10 MG tablet Take 1 tablet (10 mg total) by mouth at bedtime.  . nortriptyline (PAMELOR) 25 MG capsule 50 mg nightly for 2 weeks, then increase to 75 mg nightly (3 capsules)  . pregabalin (LYRICA) 300 MG capsule Take 1 capsule (300 mg total) 2 (two) times daily by mouth.  . traMADol (ULTRAM) 50 MG tablet Take 1 tablet (50 mg total) by mouth every 8 (eight) hours as needed.   No facility-administered encounter medications on file as of 11/11/2017.     Allergies: Pollen extract  There is no height or weight on file to calculate BMI.  There were no vitals taken for this visit.    Review of Systems  Constitutional: Positive for fatigue. Negative for activity change, appetite change, chills, diaphoresis, fever and unexpected weight change.  HENT: Negative for congestion.   Eyes: Negative for visual disturbance.  Respiratory: Positive for cough. Negative for chest tightness, shortness of breath, wheezing and stridor.   Cardiovascular: Negative for chest pain, palpitations and leg swelling.  Gastrointestinal: Negative for abdominal distention, abdominal pain, blood in stool, constipation, diarrhea, nausea and vomiting.  Genitourinary: Positive for scrotal swelling and testicular pain. Negative for difficulty urinating, discharge, flank pain, hematuria, penile pain and penile swelling.  Musculoskeletal: Positive for arthralgias, back pain, gait problem, joint swelling, myalgias, neck pain and neck stiffness.  Skin: Negative for color change, pallor, rash and wound.  Neurological: Negative for dizziness and headaches.  Hematological: Does not bruise/bleed easily.  Psychiatric/Behavioral: Negative for decreased concentration, dysphoric mood, hallucinations, self-injury, sleep disturbance and suicidal ideas. The patient is not nervous/anxious and is not hyperactive.        Objective:   Physical Exam  Constitutional: He is oriented to  person, place, and time. He appears well-developed and well-nourished. No distress.  HENT:  Head: Normocephalic and atraumatic.  Right Ear: Hearing, tympanic membrane, external ear and ear canal normal. Tympanic membrane is not erythematous and not bulging. No decreased hearing is noted.  Left Ear: Hearing, tympanic membrane, external ear and ear canal normal. Tympanic membrane is not erythematous and not bulging. No decreased hearing is noted.  Nose: Septal deviation present. Right sinus exhibits no frontal sinus tenderness. Left sinus exhibits no frontal sinus tenderness.  Mouth/Throat: Uvula is midline and oropharynx is clear and moist. Dental caries present.  Eyes: Conjunctivae are normal. Pupils are equal, round, and reactive to light.  Neck: Normal range of motion. Neck supple.  Cardiovascular: Normal rate, regular rhythm, normal heart sounds and intact distal pulses.  No murmur heard. Pulmonary/Chest: Effort normal and breath sounds normal. No respiratory distress. He has no wheezes. He has no rales. He exhibits no tenderness.  Abdominal: Soft. Bowel sounds are normal. He exhibits no distension and no mass. There is no tenderness.  There is no rebound, no guarding and no CVA tenderness. A hernia is present. Hernia confirmed positive in the ventral area. Hernia confirmed negative in the right inguinal area and confirmed negative in the left inguinal area.  Large, reducible, non-tender umbilical hernia  Genitourinary: Right testis shows mass. Right testis shows no tenderness. Left testis shows tenderness. No penile tenderness.  Genitourinary Comments: Chaperone present during examination.  Musculoskeletal: He exhibits edema and tenderness.       Right ankle: He exhibits swelling.       Left ankle: He exhibits swelling.       Right lower leg: He exhibits swelling.       Left lower leg: He exhibits swelling.       Right foot: There is swelling.       Left foot: There is swelling.   Lymphadenopathy:    He has no cervical adenopathy.  Neurological: He is alert and oriented to person, place, and time.  Skin: Skin is warm and dry. No rash noted. He is not diaphoretic. No erythema. No pallor.  Psychiatric: He has a normal mood and affect. His behavior is normal. Judgment and thought content normal.  Nursing note and vitals reviewed.         Assessment & Plan:   1. Deviated septum   2. Scrotal pain   3. Umbilical hernia without obstruction and without gangrene   4. Scrotal cyst   5. Physical exam, annual   6. Screening, lipid   7. Screening for thyroid disorder   8. Screening for diabetes mellitus (DM)   9. Essential hypertension   10. TOBACCO ABUSE     Physical exam, annual Continue all medication as directed. Please start HCTZ 25mg  once each morning. Increase water intake, strive for at least gallon/day.   Follow Heart Healthy diet Increase regular walking. Try to reduce-stop tobacco use, use Nicoderm patches that you already have. ENT referral placed. Please schedule your Sleep Study. Ultrasound orders placed for 1) umbilical hernia 2) scrotal pain We will call you when your lab and imaging results are available. Follow-up in 3 months,-blood pressure and BMP labs.  HTN (hypertension) BP above goal both checks, 1+ edema in lower extremities Continue Amlodipine 2.5mg  and added HCTZ 25mg  daily CMP drawn today, f/u 3 months with BMP check  Deviated septum ENT referral placed  TOBACCO ABUSE Use Nicoderm patch- does not need RF  Umbilical hernia without obstruction and without gangrene Abdominal US order placed  Scrotal pain Scrotal US order placed  Scrotal cyst Scrotal US order placed    FOLLOW-UP:  Return in about 3 months (around 02/09/2018) for HTN, Regular Follow Up, Obesity, BMP.

## 2017-11-11 ENCOUNTER — Encounter: Payer: Self-pay | Admitting: Adult Health

## 2017-11-11 ENCOUNTER — Other Ambulatory Visit: Payer: Self-pay | Admitting: Adult Health

## 2017-11-11 ENCOUNTER — Ambulatory Visit (INDEPENDENT_AMBULATORY_CARE_PROVIDER_SITE_OTHER): Payer: BC Managed Care – PPO | Admitting: Adult Health

## 2017-11-11 VITALS — BP 143/79 | HR 96 | Ht 74.75 in | Wt 341.0 lb

## 2017-11-11 DIAGNOSIS — K429 Umbilical hernia without obstruction or gangrene: Secondary | ICD-10-CM

## 2017-11-11 DIAGNOSIS — Z1329 Encounter for screening for other suspected endocrine disorder: Secondary | ICD-10-CM

## 2017-11-11 DIAGNOSIS — F17200 Nicotine dependence, unspecified, uncomplicated: Secondary | ICD-10-CM

## 2017-11-11 DIAGNOSIS — N5082 Scrotal pain: Secondary | ICD-10-CM

## 2017-11-11 DIAGNOSIS — L729 Follicular cyst of the skin and subcutaneous tissue, unspecified: Secondary | ICD-10-CM | POA: Diagnosis not present

## 2017-11-11 DIAGNOSIS — J342 Deviated nasal septum: Secondary | ICD-10-CM

## 2017-11-11 DIAGNOSIS — I10 Essential (primary) hypertension: Secondary | ICD-10-CM

## 2017-11-11 DIAGNOSIS — Z131 Encounter for screening for diabetes mellitus: Secondary | ICD-10-CM

## 2017-11-11 DIAGNOSIS — Z0000 Encounter for general adult medical examination without abnormal findings: Secondary | ICD-10-CM

## 2017-11-11 DIAGNOSIS — Z13220 Encounter for screening for lipoid disorders: Secondary | ICD-10-CM

## 2017-11-11 MED ORDER — HYDROCHLOROTHIAZIDE 25 MG PO TABS: 25.0000 mg | ORAL_TABLET | Freq: Every day | ORAL | 3 refills | Status: DC

## 2017-11-11 NOTE — Assessment & Plan Note (Signed)
BP above goal both checks, 1+ edema in lower extremities Continue Amlodipine 2.5mg  and added HCTZ 25mg  daily CMP drawn today, f/u 3 months with BMP check

## 2017-11-11 NOTE — Assessment & Plan Note (Signed)
Use Nicoderm patch- does not need RF

## 2017-11-11 NOTE — Assessment & Plan Note (Signed)
Scrotal US order placed

## 2017-11-11 NOTE — Assessment & Plan Note (Signed)
Continue all medication as directed. Please start HCTZ 25mg  once each morning. Increase water intake, strive for at least gallon/day.   Follow Heart Healthy diet Increase regular walking. Try to reduce-stop tobacco use, use Nicoderm patches that you already have. ENT referral placed. Please schedule your Sleep Study. Ultrasound orders placed for 1) umbilical hernia 2) scrotal pain We will call you when your lab and imaging results are available. Follow-up in 3 months,-blood pressure and BMP labs.

## 2017-11-11 NOTE — Patient Instructions (Addendum)
Heart-Healthy Eating Plan Many factors influence your heart health, including eating and exercise habits. Heart (coronary) risk increases with abnormal blood fat (lipid) levels. Heart-healthy meal planning includes limiting unhealthy fats, increasing healthy fats, and making other small dietary changes. This includes maintaining a healthy body weight to help keep lipid levels within a normal range. What is my plan? Your health care provider recommends that you:  Get no more than __25__% of the total calories in your daily diet from fat.  Limit your intake of saturated fat to less than ___5__% of your total calories each day.  Limit the amount of cholesterol in your diet to less than _300__ mg per day.  What types of fat should I choose?  Choose healthy fats more often. Choose monounsaturated and polyunsaturated fats, such as olive oil and canola oil, flaxseeds, walnuts, almonds, and seeds.  Eat more omega-3 fats. Good choices include salmon, mackerel, sardines, tuna, flaxseed oil, and ground flaxseeds. Aim to eat fish at least two times each week.  Limit saturated fats. Saturated fats are primarily found in animal products, such as meats, butter, and cream. Plant sources of saturated fats include palm oil, palm kernel oil, and coconut oil.  Avoid foods with partially hydrogenated oils in them. These contain trans fats. Examples of foods that contain trans fats are stick margarine, some tub margarines, cookies, crackers, and other baked goods. What general guidelines do I need to follow?  Check food labels carefully to identify foods with trans fats or high amounts of saturated fat.  Fill one half of your plate with vegetables and green salads. Eat 4-5 servings of vegetables per day. A serving of vegetables equals 1 cup of raw leafy vegetables,  cup of raw or cooked cut-up vegetables, or  cup of vegetable juice.  Fill one fourth of your plate with whole grains. Look for the word "whole" as  the first word in the ingredient list.  Fill one fourth of your plate with lean protein foods.  Eat 4-5 servings of fruit per day. A serving of fruit equals one medium whole fruit,  cup of dried fruit,  cup of fresh, frozen, or canned fruit, or  cup of 100% fruit juice.  Eat more foods that contain soluble fiber. Examples of foods that contain this type of fiber are apples, broccoli, carrots, beans, peas, and barley. Aim to get 20-30 g of fiber per day.  Eat more home-cooked food and less restaurant, buffet, and fast food.  Limit or avoid alcohol.  Limit foods that are high in starch and sugar.  Avoid fried foods.  Cook foods by using methods other than frying. Baking, boiling, grilling, and broiling are all great options. Other fat-reducing suggestions include: ? Removing the skin from poultry. ? Removing all visible fats from meats. ? Skimming the fat off of stews, soups, and gravies before serving them. ? Steaming vegetables in water or broth.  Lose weight if you are overweight. Losing just 5-10% of your initial body weight can help your overall health and prevent diseases such as diabetes and heart disease.  Increase your consumption of nuts, legumes, and seeds to 4-5 servings per week. One serving of dried beans or legumes equals  cup after being cooked, one serving of nuts equals 1 ounces, and one serving of seeds equals  ounce or 1 tablespoon.  You may need to monitor your salt (sodium) intake, especially if you have high blood pressure. Talk with your health care provider or dietitian to get  more information about reducing sodium. What foods can I eat? Grains  Breads, including [place], white, pita, wheat, raisin, rye, oatmeal, and [place]. Tortillas that are neither fried nor made with lard or trans fat. Low-fat rolls, including hotdog and hamburger buns and English muffins. Biscuits. Muffins. Waffles. Pancakes. Light popcorn. Whole-grain cereals. Flatbread. Melba toast.  Pretzels. Breadsticks. Rusks. Low-fat snacks and crackers, including oyster, saltine, matzo, graham, animal, and rye. Rice and pasta, including brown rice and those that are made with whole wheat. Vegetables All vegetables. Fruits All fruits, but limit coconut. Meats and Other Protein Sources Lean, well-trimmed beef, veal, pork, and lamb. Chicken and [place] without skin. All fish and shellfish. Wild duck, rabbit, pheasant, and venison. Egg whites or low-cholesterol egg substitutes. Dried beans, peas, lentils, and tofu.Seeds and most nuts. Dairy Low-fat or nonfat cheeses, including ricotta, string, and mozzarella. Skim or 1% milk that is liquid, powdered, or evaporated. Buttermilk that is made with low-fat milk. Nonfat or low-fat yogurt. Beverages Mineral water. Diet carbonated beverages. Sweets and Desserts Sherbets and fruit ices. Honey, jam, marmalade, jelly, and syrups. Meringues and gelatins. Pure sugar candy, such as hard candy, jelly beans, gumdrops, mints, marshmallows, and small amounts of dark chocolate. [employer] Eat all sweets and desserts in moderation. Fats and Oils Nonhydrogenated (trans-free) margarines. Vegetable oils, including soybean, sesame, sunflower, olive, peanut, safflower, corn, canola, and cottonseed. Salad dressings or mayonnaise that are made with a vegetable oil. Limit added fats and oils that you use for cooking, baking, salads, and as spreads. Other Cocoa powder. Coffee and tea. All seasonings and condiments. The items listed above may not be a complete list of recommended foods or beverages. Contact your dietitian for more options. What foods are not recommended? Grains Breads that are made with saturated or trans fats, oils, or whole milk. Croissants. Butter rolls. Cheese breads. Sweet rolls. Donuts. Buttered popcorn. Chow mein noodles. High-fat crackers, such as cheese or butter crackers. Meats and Other Protein Sources Fatty meats, such as hotdogs,  short ribs, sausage, spareribs, bacon, ribeye roast or steak, and mutton. High-fat deli meats, such as salami and bologna. Caviar. Domestic duck and goose. Organ meats, such as kidney, liver, sweetbreads, brains, gizzard, chitterlings, and heart. Dairy Cream, sour cream, cream cheese, and creamed cottage cheese. Whole milk cheeses, including blue (bleu), Monterey Jack, [place], [place], American, [place], Swiss, [place], [place], and [place]. Whole or 2% milk that is liquid, evaporated, or condensed. Whole buttermilk. Cream sauce or high-fat cheese sauce. Yogurt that is made from whole milk. Beverages Regular sodas and drinks with added sugar. Sweets and Desserts Frosting. Pudding. Cookies. Cakes other than angel food cake. Candy that has milk chocolate or white chocolate, hydrogenated fat, butter, coconut, or unknown ingredients. Buttered syrups. Full-fat ice cream or ice cream drinks. Fats and Oils Gravy that has suet, meat fat, or shortening. Cocoa butter, hydrogenated oils, palm oil, coconut oil, palm kernel oil. These can often be found in baked products, candy, fried foods, nondairy creamers, and whipped toppings. Solid fats and shortenings, including bacon fat, salt pork, lard, and butter. Nondairy cream substitutes, such as coffee creamers and sour cream substitutes. Salad dressings that are made of unknown oils, cheese, or sour cream. The items listed above may not be a complete list of foods and beverages to avoid. Contact your dietitian for more information. This information is not intended to replace advice given to you by your health care provider. Make sure you discuss any questions you have with your health care  provider. Document Released: 07/14/2008 Document Revised: 04/24/2016 Document Reviewed: 03/29/2014 Elsevier Interactive Patient Education  2018 [place], Adult A hernia is the bulging of an organ or tissue through a weak spot in the muscles of the abdomen (abdominal  wall). Hernias develop most often near the navel or groin. There are many kinds of hernias. Common kinds include:  Femoral hernia. This kind of hernia develops under the groin in the upper thigh area.  Inguinal hernia. This kind of hernia develops in the groin or scrotum.  Umbilical hernia. This kind of hernia develops near the navel.  Hiatal hernia. This kind of hernia causes part of the stomach to be pushed up into the chest.  Incisional hernia. This kind of hernia bulges through a scar from an abdominal surgery.  What are the causes? This condition may be caused by:  Heavy lifting.  Coughing over a long period of time.  Straining to have a bowel movement.  An incision made during an abdominal surgery.  A birth defect (congenital defect).  Excess weight or obesity.  Smoking.  Poor nutrition.  Cystic fibrosis.  Excess fluid in the abdomen.  Undescended testicles.  What are the signs or symptoms? Symptoms of a hernia include:  A lump on the abdomen. This is the first sign of a hernia. The lump may become more obvious with standing, straining, or coughing. It may get bigger over time if it is not treated or if the condition causing it is not treated.  Pain. A hernia is usually painless, but it may become painful over time if treatment is delayed. The pain is usually dull and may get worse with standing or lifting heavy objects.  Sometimes a hernia gets tightly squeezed in the weak spot (strangulated) or stuck there (incarcerated) and causes additional symptoms. These symptoms may include:  Vomiting.  Nausea.  Constipation.  Irritability.  How is this diagnosed? A hernia may be diagnosed with:  A physical exam. During the exam your health care provider may ask you to cough or to make a specific movement, because a hernia is usually more visible when you move.  Imaging tests. These can include: ? X-rays. ? Ultrasound. ? CT scan.  How is this treated? A  hernia that is small and painless may not need to be treated. A hernia that is large or painful may be treated with surgery. Inguinal hernias may be treated with surgery to prevent incarceration or strangulation. Strangulated hernias are always treated with surgery, because lack of blood to the trapped organ or tissue can cause it to die. Surgery to treat a hernia involves pushing the bulge back into place and repairing the weak part of the abdomen. Follow these instructions at home:  Avoid straining.  Do not lift anything heavier than 10 lb (4.5 kg).  Lift with your leg muscles, not your back muscles. This helps avoid strain.  When coughing, try to cough gently.  Prevent constipation. Constipation leads to straining with bowel movements, which can make a hernia worse or cause a hernia repair to break down. You can prevent constipation by: ? Eating a high-fiber diet that includes plenty of fruits and vegetables. ? Drinking enough fluids to keep your urine clear or pale yellow. Aim to drink 6-8 glasses of water per day. ? Using a stool softener as directed by your health care provider.  Lose weight, if you are overweight.  Do not use any tobacco products, including cigarettes, chewing tobacco, or electronic cigarettes. If  you need help quitting, ask your health care provider.  Keep all follow-up visits as directed by your health care provider. This is important. Your health care provider may need to monitor your condition. Contact a health care provider if:  You have swelling, redness, and pain in the affected area.  Your bowel habits change. Get help right away if:  You have a fever.  You have abdominal pain that is getting worse.  You feel nauseous or you vomit.  You cannot push the hernia back in place by gently pressing on it while you are lying down.  The hernia: ? Changes in shape or size. ? Is stuck outside the abdomen. ? Becomes discolored. ? Feels hard or  tender. This information is not intended to replace advice given to you by your health care provider. Make sure you discuss any questions you have with your health care provider. Document Released: 10/05/2005 Document Revised: 03/04/2016 Document Reviewed: 08/15/2014 Elsevier Interactive Patient Education  2017 [place] all medication as directed. Please start HCTZ 25mg  once each morning. Increase water intake, strive for at least gallon/day.   Follow Heart Healthy diet Increase regular walking. Try to reduce-stop tobacco use, use Nicoderm patches that you already have. ENT referral placed. Please schedule your Sleep Study. Ultrasound orders placed for 1) umbilical hernia 2) scrotal pain We will call you when your lab and imaging results are available. Follow-up in 3 months,-blood pressure and BMP labs. NICE TO SEE YOU!

## 2017-11-11 NOTE — Assessment & Plan Note (Signed)
ENT referral placed.

## 2017-11-11 NOTE — Assessment & Plan Note (Signed)
Abdominal US order placed

## 2017-11-12 LAB — CBC WITH DIFFERENTIAL/PLATELET
Basophils Absolute: 0.1 10*3/uL (ref 0.0–0.2)
Basos: 1 %
EOS (ABSOLUTE): 0.2 10*3/uL (ref 0.0–0.4)
Eos: 3 %
Hematocrit: 43.8 % (ref 37.5–51.0)
Hemoglobin: 15.4 g/dL (ref 13.0–17.7)
Immature Grans (Abs): 0.1 10*3/uL (ref 0.0–0.1)
Immature Granulocytes: 1 %
Lymphocytes Absolute: 2.1 10*3/uL (ref 0.7–3.1)
Lymphs: 26 %
MCH: 32.6 pg (ref 26.6–33.0)
MCHC: 35.2 g/dL (ref 31.5–35.7)
MCV: 93 fL (ref 79–97)
Monocytes Absolute: 0.7 10*3/uL (ref 0.1–0.9)
Monocytes: 8 %
Neutrophils Absolute: 4.8 10*3/uL (ref 1.4–7.0)
Neutrophils: 61 %
Platelets: 175 10*3/uL (ref 150–379)
RBC: 4.73 x10E6/uL (ref 4.14–5.80)
RDW: 14.7 % (ref 12.3–15.4)
WBC: 7.8 10*3/uL (ref 3.4–10.8)

## 2017-11-12 LAB — LIPID PANEL
Chol/HDL Ratio: 4.5 ratio (ref 0.0–5.0)
Cholesterol, Total: 113 mg/dL (ref 100–199)
HDL: 25 mg/dL — ABNORMAL LOW (ref >39)
LDL Calculated: 53 mg/dL (ref 0–99)
Triglycerides: 176 mg/dL — ABNORMAL HIGH (ref 0–149)
VLDL Cholesterol Cal: 35 mg/dL (ref 5–40)

## 2017-11-12 LAB — COMPREHENSIVE METABOLIC PANEL
ALT: 33 IU/L (ref 0–44)
AST: 31 IU/L (ref 0–40)
Albumin/Globulin Ratio: 1.6 (ref 1.2–2.2)
Albumin: 4.9 g/dL (ref 3.5–5.5)
Alkaline Phosphatase: 115 IU/L (ref 39–117)
BUN/Creatinine Ratio: 17 (ref 9–20)
BUN: 20 mg/dL (ref 6–24)
Bilirubin Total: 1.1 mg/dL (ref 0.0–1.2)
CO2: 23 mmol/L (ref 20–29)
Calcium: 9.4 mg/dL (ref 8.7–10.2)
Chloride: 101 mmol/L (ref 96–106)
Creatinine, Ser: 1.18 mg/dL (ref 0.76–1.27)
GFR calc Af Amer: 84 mL/min/{1.73_m2} (ref >59)
GFR calc non Af Amer: 73 mL/min/{1.73_m2} (ref >59)
Globulin, Total: 3.1 g/dL (ref 1.5–4.5)
Glucose: 84 mg/dL (ref 65–99)
Potassium: 4.1 mmol/L (ref 3.5–5.2)
Sodium: 141 mmol/L (ref 134–144)
Total Protein: 8 g/dL (ref 6.0–8.5)

## 2017-11-12 LAB — TSH: TSH: 0.836 u[IU]/mL (ref 0.450–4.500)

## 2017-11-12 LAB — HEMOGLOBIN A1C
Est. average glucose Bld gHb Est-mCnc: 85 mg/dL
Hgb A1c MFr Bld: 4.6 % — ABNORMAL LOW (ref 4.8–5.6)

## 2017-11-15 ENCOUNTER — Ambulatory Visit
Admission: RE | Admit: 2017-11-15 | Discharge: 2017-11-15 | Disposition: A | Discharge status: Home / Self Care | Payer: BC Managed Care – PPO | Source: Ambulatory Visit | Attending: Adult Health | Admitting: Adult Health

## 2017-11-15 ENCOUNTER — Other Ambulatory Visit: Payer: Self-pay

## 2017-11-15 ENCOUNTER — Other Ambulatory Visit: Payer: Self-pay | Admitting: Adult Health

## 2017-11-19 ENCOUNTER — Other Ambulatory Visit: Payer: Self-pay | Admitting: Physical Medicine & Rehabilitation

## 2017-11-19 DIAGNOSIS — G609 Hereditary and idiopathic neuropathy, unspecified: Secondary | ICD-10-CM

## 2017-11-19 DIAGNOSIS — E538 Deficiency of other specified B group vitamins: Secondary | ICD-10-CM

## 2017-11-22 ENCOUNTER — Other Ambulatory Visit: Payer: Self-pay

## 2017-11-22 ENCOUNTER — Telehealth: Payer: Self-pay | Admitting: Adult Health

## 2017-11-22 DIAGNOSIS — E538 Deficiency of other specified B group vitamins: Secondary | ICD-10-CM

## 2017-11-22 DIAGNOSIS — G609 Hereditary and idiopathic neuropathy, unspecified: Secondary | ICD-10-CM

## 2017-11-22 MED ORDER — PREGABALIN 300 MG PO CAPS: 300.0000 mg | ORAL_CAPSULE | Freq: Two times a day (BID) | ORAL | 2 refills | Status: DC

## 2017-11-22 NOTE — Telephone Encounter (Signed)
Advised pt that Dr. Joycelyn Schmid office sent in RX today.  Charyl Bigger, CMA

## 2017-11-22 NOTE — Telephone Encounter (Signed)
Patient called states he has tried multiple times to reach [facility] provider/ Dr. Ella Bodo who has "Not" returned any phone calls & he is out of his Lyrica (pain meds)--  ---------- pt request PCP reach out to [facility] doctor to see why he / staff will not return any calls  Or if she can prescribe him the Lyrica.  --Please call him at [phone].  --glh

## 2017-12-04 ENCOUNTER — Ambulatory Visit (HOSPITAL_BASED_OUTPATIENT_CLINIC_OR_DEPARTMENT_OTHER): Payer: BC Managed Care – PPO | Attending: Psychiatry | Admitting: Internal Medicine

## 2017-12-04 VITALS — Ht 76.0 in | Wt 330.0 lb

## 2017-12-04 DIAGNOSIS — G4710 Hypersomnia, unspecified: Secondary | ICD-10-CM

## 2017-12-04 DIAGNOSIS — G4730 Sleep apnea, unspecified: Secondary | ICD-10-CM | POA: Diagnosis present

## 2017-12-04 DIAGNOSIS — G4733 Obstructive sleep apnea (adult) (pediatric): Secondary | ICD-10-CM | POA: Diagnosis not present

## 2017-12-04 DIAGNOSIS — R0902 Hypoxemia: Secondary | ICD-10-CM | POA: Diagnosis not present

## 2017-12-07 ENCOUNTER — Other Ambulatory Visit (HOSPITAL_COMMUNITY): Payer: Self-pay | Admitting: Psychiatry

## 2017-12-07 DIAGNOSIS — J324 Chronic pansinusitis: Secondary | ICD-10-CM | POA: Insufficient documentation

## 2017-12-07 DIAGNOSIS — F332 Major depressive disorder, recurrent severe without psychotic features: Secondary | ICD-10-CM

## 2017-12-07 DIAGNOSIS — J343 Hypertrophy of nasal turbinates: Secondary | ICD-10-CM | POA: Insufficient documentation

## 2017-12-17 ENCOUNTER — Ambulatory Visit: Payer: BC Managed Care – PPO | Admitting: Physical Medicine & Rehabilitation

## 2017-12-17 ENCOUNTER — Encounter: Payer: BC Managed Care – PPO | Attending: Physical Medicine & Rehabilitation

## 2017-12-17 ENCOUNTER — Encounter: Payer: Self-pay | Admitting: Physical Medicine & Rehabilitation

## 2017-12-17 VITALS — BP 123/86 | HR 93 | Resp 14

## 2017-12-17 DIAGNOSIS — M21611 Bunion of right foot: Secondary | ICD-10-CM | POA: Diagnosis not present

## 2017-12-17 DIAGNOSIS — M79671 Pain in right foot: Secondary | ICD-10-CM | POA: Diagnosis not present

## 2017-12-17 DIAGNOSIS — Z79899 Other long term (current) drug therapy: Secondary | ICD-10-CM | POA: Diagnosis not present

## 2017-12-17 DIAGNOSIS — M79672 Pain in left foot: Secondary | ICD-10-CM | POA: Insufficient documentation

## 2017-12-17 DIAGNOSIS — Z5181 Encounter for therapeutic drug level monitoring: Secondary | ICD-10-CM

## 2017-12-17 DIAGNOSIS — G609 Hereditary and idiopathic neuropathy, unspecified: Secondary | ICD-10-CM

## 2017-12-17 DIAGNOSIS — G894 Chronic pain syndrome: Secondary | ICD-10-CM | POA: Insufficient documentation

## 2017-12-17 DIAGNOSIS — M21612 Bunion of left foot: Secondary | ICD-10-CM | POA: Insufficient documentation

## 2017-12-17 DIAGNOSIS — G608 Other hereditary and idiopathic neuropathies: Secondary | ICD-10-CM | POA: Diagnosis not present

## 2017-12-17 MED ORDER — TRAMADOL HCL 50 MG PO TABS: 50.0000 mg | ORAL_TABLET | Freq: Two times a day (BID) | ORAL | 5 refills | Status: DC

## 2017-12-17 NOTE — Patient Instructions (Signed)
Will reduce Tramadol to twice a day given the increasing dose of nortriptyline
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
## 2017-12-18 ENCOUNTER — Other Ambulatory Visit (HOSPITAL_COMMUNITY): Payer: Self-pay | Admitting: Psychiatry

## 2017-12-18 DIAGNOSIS — G4730 Sleep apnea, unspecified: Secondary | ICD-10-CM

## 2017-12-18 DIAGNOSIS — G4710 Hypersomnia, unspecified: Secondary | ICD-10-CM

## 2017-12-18 DIAGNOSIS — G4733 Obstructive sleep apnea (adult) (pediatric): Secondary | ICD-10-CM

## 2017-12-18 NOTE — Progress Notes (Signed)
Thank you sir! I have made referral to ENT and will touch base with patient on this as well
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
## 2017-12-18 NOTE — Addendum Note (Signed)
Addended by: Lulu Riding A on: 12/18/2017 09:39 AM   Modules accepted: Orders

## 2017-12-18 NOTE — Procedures (Signed)
Patient Name: Eduardo [name], Gue[place] Date: 12/04/2017 Gender: Male D.O.B: 18-Sep-1971 Age (years): 46 Referring Provider: Lulu Riding Height (inches): 76 Interpreting Physician: Baird Lyons MD, ABSM Weight (lbs): 330 RPSGT: Haroldine Laws BMI: 40 MRN: [id number] Neck Size: 21.00 <br> <br> CLINICAL INFORMATION Sleep Study Type: NPSG  Indication for sleep study: Excessive Daytime Sleepiness  Epworth Sleepiness Score: 17  SLEEP STUDY TECHNIQUE As per the AASM Manual for the Scoring of Sleep and Associated Events v2.3 (April 2016) with a hypopnea requiring 4% desaturations.  The channels recorded and monitored were frontal, central and occipital EEG, electrooculogram (EOG), submentalis EMG (chin), nasal and oral airflow, thoracic and abdominal wall motion, anterior tibialis EMG, snore microphone, electrocardiogram, and pulse oximetry.  MEDICATIONS Medications self-administered by patient taken the night of the study : none reported  SLEEP ARCHITECTURE The study was initiated at 10:14:24 PM and ended at 4:32:47 AM.  Sleep onset time was 6.4 minutes and the sleep efficiency was 65.1%. The total sleep time was 246.5 minutes.  Stage REM latency was 131.5 minutes.  The patient spent 17.65% of the night in stage N1 sleep, 63.89% in stage N2 sleep, 0.00% in stage N3 and 18.46% in REM.  Alpha intrusion was absent.  Supine sleep was 11.56%.  RESPIRATORY PARAMETERS The overall apnea/hypopnea index (AHI) was 83.2 per hour. There were 3 total apneas, including 3 obstructive, 0 central and 0 mixed apneas. There were 339 hypopneas and 4 RERAs.  The AHI during Stage REM sleep was 96.3 per hour.  AHI while supine was 67.4 per hour.  The mean oxygen saturation was 87.40%. The minimum SpO2 during sleep was 60.00%.  loud snoring was noted during this study.  CARDIAC DATA The 2 lead EKG demonstrated sinus rhythm. The mean heart rate was 85.97 beats per minute. Other EKG  findings include: None.  LEG MOVEMENT DATA The total PLMS were 2 with a resulting PLMS index of 0.49. Associated arousal with leg movement index was 0.2 .  IMPRESSIONS - Severe obstructive sleep apnea occurred during this study (AHI = 83.2/h). - The patient qualified for split protocol CPAP titration as ordered. He declared inability to breathe through his nose. Masks were tried accordingly, but he did not tolerate any of the mask options tried, and terminated the study. - No significant central sleep apnea occurred during this study (CAI = 0.0/h). - Severe oxygen desaturation was noted during this study (Min O2 = 60.00%). Mean 87.4%. - The patient snored with loud snoring volume. - No cardiac abnormalities were noted during this study. - Clinically significant periodic limb movements did not occur during sleep. No significant associated arousals. -  DIAGNOSIS - Obstructive Sleep Apnea (327.23 [G47.33 ICD-10]) - Nocturnal Hypoxemia (327.26 [G47.36 ICD-10])  RECOMMENDATIONS - Recommend ENT evaluation for possibility of improving nasal airway. - Consider return for CPAP titration study, perhaps with an anxiolytic if appropriate, to allow more time for mask fitting and desensitization. - Avoid alcohol, sedatives and other CNS depressants that may worsen sleep apnea and disrupt normal sleep architecture. - Sleep hygiene should be reviewed to assess factors that may improve sleep quality. - Weight management and regular exercise should be initiated or continued if appropriate.  [Electronically signed] 12/18/2017 08:50 AM  Baird Lyons MD, ABSM Diplomate, American Board of Sleep Medicine   NPI: [id number]                          [place], American Board of Sleep Medicine  ELECTRONICALLY SIGNED ON:  12/18/2017, 8:45 AM [place] PH: [phone]   FX: [phone] [place]

## 2017-12-18 NOTE — Progress Notes (Signed)
Hey there, wanted to let you know that patient has severe OSA and was unable to tolerate a CPAP titration.  I have referred him to ENT for further assessment

## 2017-12-22 LAB — TOXASSURE SELECT,+ANTIDEPR,UR

## 2017-12-23 ENCOUNTER — Telehealth: Payer: Self-pay | Admitting: *Deleted

## 2017-12-23 NOTE — Telephone Encounter (Signed)
Urine drug screen for this encounter is consistent for prescribed medication 

## 2018-01-11 ENCOUNTER — Encounter (HOSPITAL_COMMUNITY): Payer: Self-pay | Admitting: Psychiatry

## 2018-01-11 ENCOUNTER — Ambulatory Visit (INDEPENDENT_AMBULATORY_CARE_PROVIDER_SITE_OTHER): Payer: BC Managed Care – PPO | Admitting: Psychiatry

## 2018-01-11 VITALS — BP 134/82 | HR 92 | Ht 76.0 in | Wt 341.0 lb

## 2018-01-11 DIAGNOSIS — G4730 Sleep apnea, unspecified: Secondary | ICD-10-CM

## 2018-01-11 DIAGNOSIS — G4733 Obstructive sleep apnea (adult) (pediatric): Secondary | ICD-10-CM

## 2018-01-11 DIAGNOSIS — F332 Major depressive disorder, recurrent severe without psychotic features: Secondary | ICD-10-CM | POA: Diagnosis not present

## 2018-01-11 DIAGNOSIS — F17210 Nicotine dependence, cigarettes, uncomplicated: Secondary | ICD-10-CM

## 2018-01-11 DIAGNOSIS — G4710 Hypersomnia, unspecified: Secondary | ICD-10-CM | POA: Diagnosis not present

## 2018-01-11 MED ORDER — NORTRIPTYLINE HCL 75 MG PO CAPS: 150.0000 mg | ORAL_CAPSULE | Freq: Every day | ORAL | 0 refills | Status: DC

## 2018-01-11 MED ORDER — MODAFINIL 100 MG PO TABS: 100.0000 mg | ORAL_TABLET | Freq: Every day | ORAL | 2 refills | Status: DC

## 2018-01-11 NOTE — Progress Notes (Signed)
BH MD/PA/NP OP Progress Note  01/11/2018 3:42 PM Eduardo [name]  MRN:  [id number]  Chief Complaint:  sleepy, pain, focus issues  HPI: Eduardo [name] presents for med management for depression and issues with attention and focus.  I spent time reiterating the severity of his sleep apnea and strongly recommending he consider either surgical intervention or CPAP.  He is quite opposed to the CPAP, and was unable to tolerate this in hospital titration.  He is considering surgical intervention with ENT.  We agreed to increase nortriptyline to 150 mg nightly to further target his neuropathic pains.  He understands that he would need to decrease his dose of tramadol given the risk of serotonin syndrome interaction, and likely go down on his dose of Lyrica in the future given that he reports little to no benefit with Lyrica.  He continues to suffer with severe fatigue symptoms and is working with machines and heavy equipment.  I suggested we consider Provigil for treatment of chronic fatigue associated with sleep apnea.  He was open to this.  We will follow-up in 2 months.  Visit Diagnosis:    ICD-10-CM   1. Severe obstructive sleep apnea G47.33 modafinil (PROVIGIL) 100 MG tablet  2. Severe episode of recurrent major depressive disorder, without psychotic features ([place]) F33.2 nortriptyline (PAMELOR) 75 MG capsule    modafinil (PROVIGIL) 100 MG tablet  3. 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 Reviewed, with no updates at this time.   Past Medical History:  Past Medical History:  Diagnosis Date  . Anxiety   . Chronic back pain   . Degenerative disk disease   . Depression   . Hypertension   . MRSA (methicillin resistant staph aureus) culture positive   . Peripheral neuropathy   . Pilonidal cyst     Past Surgical History:  Procedure Laterality Date  . PILONIDAL CYST DRAINAGE [name]  04/10/2017   Procedure: IRRIGATION AND DEBRIDEMENT BACK ABSCESS;  Surgeon: [name], MD;  Location: [facility];  Service: General;  Laterality: [name];  . THORACIC OUTLET SURGERY  2000  . WRIST FUSION  2002    Family Psychiatric History: See intake H&P for full details. Reviewed, with no updates at this time.   Family History:  Family History  Problem Relation Age of Onset  . Cancer Maternal Grandmother        breast, ovarian & colon  . Hypertension Mother   . Diabetes Mother   . Hypertension Father   . Heart disease Father   . Stroke Paternal Grandfather   . Neuropathy Neg Hx     Social History:  Social History   Socioeconomic History  . Marital status: Divorced    Spouse name: Not on file  . Number of children: 2  . Years of education: 12th   . Highest education level: Not on file  Occupational History  . Occupation: [name]  Social Needs  . Financial resource strain: Not on file  . Food insecurity:    Worry: Not on file    Inability: Not on file  . Transportation needs:    Medical: Not on file    Non-medical: Not on file  Tobacco Use  . Smoking status: Current Every Day Smoker    Packs/day: 1.00    Years: 33.00    Pack years: 33.00    Types: Cigarettes  . Smokeless tobacco: Never  Used  Substance and Sexual Activity  . Alcohol use: No    Alcohol/week: 0.0 oz    Comment: Rarely  . Drug use: No  . Sexual activity: Yes    Birth control/protection: None  Lifestyle  . Physical activity:    Days per week: Not on file    Minutes per session: Not on file  . Stress: Not on file  Relationships  . Social connections:    Talks on phone: Not on file    Gets together: Not on file    Attends religious service: Not on file    Active member of club or organization: Not on file    Attends meetings of clubs or organizations: Not on file    Relationship status: Not on file  Other Topics Concern  . Not on file  Social History Narrative   Lives at home w/ his step-mother    Right-handed   Drinks about 2-3 Mt Dews a day    Allergies:  Allergies  Allergen Reactions  . Pollen Extract Cough    Metabolic Disorder Labs: Lab Results  Component Value Date   HGBA1C 4.6 (L) 11/11/2017   MPG 91 10/25/2014   No results found for: PROLACTIN Lab Results  Component Value Date   CHOL 113 11/11/2017   TRIG 176 (H) 11/11/2017   HDL 25 (L) 11/11/2017   CHOLHDL 4.5 11/11/2017   VLDL 36 10/25/2014   LDLCALC 53 11/11/2017   LDLCALC 42 10/25/2014   Lab Results  Component Value Date   TSH 0.836 11/11/2017   TSH 0.889 05/09/2015    Therapeutic Level Labs: No results found for: LITHIUM No results found for: VALPROATE No components found for:  CBMZ  Current Medications: Current Outpatient Medications  Medication Sig Dispense Refill  . acetaminophen (TYLENOL) 650 MG CR tablet Take 650 mg by mouth every 6 (six) hours as needed for pain.    [name] amLODipine (NORVASC) 2.5 MG tablet Take 1 tablet (2.5 mg total) by mouth daily. 90 tablet 2  . diphenhydramine-acetaminophen (TYLENOL PM) 25-500 MG TABS tablet You can use this at bedtime, instead of the regular Tylenol.    DO NOT TAKE MORE THAN 4000 MG OF TYLENOL PER DAY.  IT CAN HARM YOUR LIVER. 14 tablet   . hydrochlorothiazide (HYDRODIURIL) 25 MG tablet Take 1 tablet (25 mg total) by mouth daily. 90 tablet 3  . montelukast (SINGULAIR) 10 MG tablet Take 1 tablet (10 mg total) by mouth at bedtime. 90 tablet 3  . nortriptyline (PAMELOR) 75 MG capsule Take 2 capsules (150 mg total) by mouth at bedtime. 180 capsule 0  . pregabalin (LYRICA) 300 MG capsule Take 1 capsule (300 mg total) by mouth 2 (two) times daily. 60 capsule 2  . traMADol (ULTRAM) 50 MG tablet Take 1 tablet (50 mg total) by mouth 2 (two) times daily. 60 tablet 5  . fluticasone (FLONASE) 50 MCG/ACT nasal spray Place 2 sprays into both nostrils daily. (Patient not taking: Reported on 01/11/2018) 16 g 6  . modafinil (PROVIGIL) 100 MG tablet Take 1 tablet (100 mg  total) by mouth daily. 30 tablet 2   No current facility-administered medications for this visit.     Musculoskeletal: Strength & Muscle Tone: within normal limits Gait & Station: normal Patient leans: [name]  Psychiatric Specialty Exam: ROS  Blood pressure 134/82, pulse 92, height 6\' 4"  (1.93 m), weight (!) 341 lb (154.7 kg), SpO2 96 %.Body mass index is 41.51 kg/m.  General Appearance: Casual and Fairly  Groomed  Eye Contact:  Fair  Speech:  Clear and Coherent, Normal Rate and nasal tone  Volume:  Normal  Mood:  Dysphoric  Affect:  Congruent  Thought Process:  Goal Directed and Descriptions of Associations: Intact  Orientation:  Full (Time, Place, and Person)  Thought Content: Logical   Suicidal Thoughts:  No  Homicidal Thoughts:  No  Memory:  Immediate;   Fair  Judgement:  Fair  Insight:  Fair  Psychomotor Activity:  Normal  Concentration:  Attention Span: Fair  Recall:  [employer] of Knowledge: Fair  Language: Fair  Akathisia:  Negative  Handed:  Right  AIMS (if indicated): not done  Assets:  Communication Skills Desire for Improvement Financial Resources/Insurance Housing Social Support Transportation Vocational/Educational  ADL's:  Intact  Cognition: WNL  Sleep:  Poor   Screenings: AUDIT     Admission (Discharged) from 12/23/2012 in [place] [facility]  Alcohol Use Disorder Identification Test Final Score (AUDIT)  0    GAD-7     Office Visit from 05/20/2017 in [place] at [facility] Visit from 04/01/2017 in [place] at [facility]  Total GAD-7 Score  20  15    PHQ2-9     Office Visit from 11/11/2017 in [place] at [facility] Visit from 10/14/2017 in [place] at [facility] Visit from 08/11/2017 in [place] [facility]12[facility]2017 in [place] [facility]  PHQ-2 Total Score  2  2  4  5  5   PHQ-9 Total Score  11  13  20  22  20        Assessment and Plan:  Eduardo [name] presents with ongoing difficulties with energy, fatigue, and attention complicated by his severe sleep apnea.  I provided him education about sleep apnea and the severity of his apnea hypoxia index.  I spent time with him considering interventions including CPAP which he is not open to at this time.  He has been working with ENT and is considering surgical intervention.  We discussed additional interventions including increase of nortriptyline to 150 mg nightly to target depression and insomnia symptoms.  We also discussed initiating Provigil to target excessive daytime sleepiness due to sleep apnea.  No acute safety issues or suicidality, and he reports some positive changes in his life with initiation of a new job.  We will follow-up in 2 months.  1. Severe obstructive sleep apnea   2. Severe episode of recurrent major depressive disorder, without psychotic features ([place])   3. Hypersomnia with sleep apnea    Status of current problems: gradually improving  Labs Ordered: No orders of the defined types were placed in this encounter.   Labs Reviewed: n/a  Collateral Obtained/Records Reviewed: n/a  Plan:  Increase nortriptyline to 150 mg Initiate Provigil 100 mg daily for severe excessive daytime sleepiness Follow-up in 2 months Continue to encourage either CPAP and/or intervention with ENT  I spent 25 minutes with the patient in direct face-to-face clinical care.  Greater than 50% of this time was spent in counseling and coordination of care with the patient.    Aundra Dubin, MD 01/11/2018, 3:42 PM

## 2018-01-21 ENCOUNTER — Other Ambulatory Visit: Payer: Self-pay | Admitting: Otolaryngology

## 2018-01-31 ENCOUNTER — Encounter (HOSPITAL_COMMUNITY): Payer: Self-pay

## 2018-01-31 ENCOUNTER — Encounter (HOSPITAL_COMMUNITY)
Admission: RE | Admit: 2018-01-31 | Discharge: 2018-01-31 | Disposition: A | Discharge status: Home / Self Care | Payer: BC Managed Care – PPO | Source: Ambulatory Visit | Attending: Otolaryngology | Admitting: Otolaryngology

## 2018-01-31 ENCOUNTER — Other Ambulatory Visit (HOSPITAL_COMMUNITY): Payer: Self-pay | Admitting: *Deleted

## 2018-01-31 ENCOUNTER — Other Ambulatory Visit: Payer: Self-pay

## 2018-01-31 DIAGNOSIS — I10 Essential (primary) hypertension: Secondary | ICD-10-CM

## 2018-01-31 DIAGNOSIS — Z01810 Encounter for preprocedural cardiovascular examination: Secondary | ICD-10-CM

## 2018-01-31 DIAGNOSIS — Z01812 Encounter for preprocedural laboratory examination: Secondary | ICD-10-CM

## 2018-01-31 HISTORY — DX: Gastro-esophageal reflux disease without esophagitis: K21.9

## 2018-01-31 HISTORY — DX: Acute embolism and thrombosis of deep veins of unspecified upper extremity: I82.629

## 2018-01-31 HISTORY — DX: Other pulmonary embolism without acute cor pulmonale: I26.99

## 2018-01-31 HISTORY — DX: Sleep apnea, unspecified: G47.30

## 2018-01-31 LAB — CBC
HCT: 42.3 % (ref 39.0–52.0)
Hemoglobin: 15.2 g/dL (ref 13.0–17.0)
MCH: 32.5 pg (ref 26.0–34.0)
MCHC: 35.9 g/dL (ref 30.0–36.0)
MCV: 90.4 fL (ref 78.0–100.0)
Platelets: 167 10*3/uL (ref 150–400)
RBC: 4.68 MIL/uL (ref 4.22–5.81)
RDW: 15.8 % — ABNORMAL HIGH (ref 11.5–15.5)
WBC: 11 10*3/uL — ABNORMAL HIGH (ref 4.0–10.5)

## 2018-01-31 LAB — SURGICAL PCR SCREEN
MRSA, PCR: POSITIVE — AB
Staphylococcus aureus: POSITIVE — AB

## 2018-01-31 LAB — BASIC METABOLIC PANEL
Anion gap: 11 (ref 5–15)
BUN: 16 mg/dL (ref 6–20)
CO2: 25 mmol/L (ref 22–32)
Calcium: 9.4 mg/dL (ref 8.9–10.3)
Chloride: 101 mmol/L (ref 101–111)
Creatinine, Ser: 1.17 mg/dL (ref 0.61–1.24)
GFR calc Af Amer: >60 mL/min (ref >60)
GFR calc non Af Amer: >60 mL/min (ref >60)
Glucose, Bld: 109 mg/dL — ABNORMAL HIGH (ref 65–99)
Potassium: 3.1 mmol/L — ABNORMAL LOW (ref 3.5–5.1)
Sodium: 137 mmol/L (ref 135–145)

## 2018-01-31 NOTE — Pre-Procedure Instructions (Signed)
Eduardo [name]  01/31/2018    Your procedure is scheduled on Thursday, February 03, 2018 at 7:30 AM.   Report to [facility] Entrance "A" Admitting Office at 5:30 AM.   Call this number if you have problems the morning of surgery: [phone]   Questions prior to day of surgery, please call [phone] between 8 & 4 PM.   Remember:  Do not eat food or drink liquids after midnight Wednesday, 02/02/18.  Take these medicines the morning of surgery with A SIP OF WATER: Amlodipine (Norvasc), Pregabalin (Lyrica), Tramadol or Tylenol - if needed   Do not wear jewelry.  Do not wear lotions, powders, cologne or deodorant.  Men may shave face and neck.  Do not bring valuables to the hospital.  [facility] is not responsible for any belongings or valuables.  Contacts, dentures or bridgework may not be worn into surgery.  Leave your suitcase in the car.  After surgery it may be brought to your room.  For patients admitted to the hospital, discharge time will be determined by your treatment team.  Patients discharged the day of surgery will not be allowed to drive home.   [place] - Preparing for Surgery  Before surgery, you can play an important role.  Because skin is not sterile, your skin needs to be as free of germs as possible.  You can reduce the number of germs on you skin by washing with CHG (chlorahexidine gluconate) soap before surgery.  CHG is an antiseptic cleaner which kills germs and bonds with the skin to continue killing germs even after washing.  Please DO NOT use if you have an allergy to CHG or antibacterial soaps.  If your skin becomes reddened/irritated stop using the CHG and inform your nurse when you arrive at Short Stay.  Do not shave (including legs and underarms) for at least 48 hours prior to the first CHG shower.  You may shave your face.  Please follow these instructions carefully:   1.  Shower with CHG Soap the night before surgery and the                     morning of Surgery.  2.  If you choose to wash your hair, wash your hair first as usual with your       normal shampoo.  3.  After you shampoo, rinse your hair and body thoroughly to remove the shampoo.  4.  Use CHG as you would any other liquid soap.  You can apply chg directly       to the skin and wash gently with scrungie or a clean washcloth.  5.  Apply the CHG Soap to your body ONLY FROM THE NECK DOWN.        Do not use on open wounds or open sores.  Avoid contact with your eyes, ears, mouth and genitals (private parts).  Wash genitals (private parts) with your normal soap.  6.  Wash thoroughly, paying special attention to the area where your surgery        will be performed.  7.  Thoroughly rinse your body with warm water from the neck down.  8.  DO NOT shower/wash with your normal soap after using and rinsing off       the CHG Soap.  9.  Pat yourself dry with a clean towel.            10.  Wear clean pajamas.  11.  Place clean sheets on your bed the night of your first shower and do not        sleep with pets.  Day of Surgery  Shower as above. Do not apply any lotions/deodorants the morning of surgery.  Please wear clean clothes to the hospital.   Please read over the fact sheets that you were given.

## 2018-01-31 NOTE — Progress Notes (Signed)
Left message on pt's voicemail that PCR was positive for MRSA and Staph. Mupirocin Ointment Rx called into [facility] (left Rx on their voicemail)

## 2018-01-31 NOTE — Progress Notes (Signed)
Pt denies cardiac history, chest pain or sob. Pt has hx of HTN, states he's nervous today and blood pressure is a little higher than normal.

## 2018-02-02 NOTE — Anesthesia Preprocedure Evaluation (Addendum)
Anesthesia Evaluation  Patient identified by MRN, date of birth, ID band Patient awake    Reviewed: Allergy & Precautions, NPO status , Patient's Chart, lab work & pertinent test results  Airway Mallampati: III  TM Distance: >3 FB Neck ROM: Full    Dental  (+) Dental Advisory Given   Pulmonary sleep apnea , Current Smoker,    breath sounds clear to auscultation       Cardiovascular hypertension, Pt. on medications  Rhythm:Regular Rate:Normal     Neuro/Psych Anxiety Depression negative neurological ROS     GI/Hepatic Neg liver ROS,   Endo/Other  negative endocrine ROS  Renal/GU negative Renal ROS     Musculoskeletal  (+) Arthritis , Osteoarthritis,    Abdominal   Peds  Hematology negative hematology ROS (+)   Anesthesia Other Findings VERY LARGE NECK  Reproductive/Obstetrics                            Lab Results  Component Value Date   WBC 11.0 (H) 01/31/2018   HGB 15.2 01/31/2018   HCT 42.3 01/31/2018   MCV 90.4 01/31/2018   PLT 167 01/31/2018   Lab Results  Component Value Date   CREATININE 1.17 01/31/2018   BUN 16 01/31/2018   NA 137 01/31/2018   K 3.1 (L) 01/31/2018   CL 101 01/31/2018   CO2 25 01/31/2018    Anesthesia Physical  Anesthesia Plan  ASA: II  Anesthesia Plan: General   Post-op Pain Management:    Induction: Intravenous  PONV Risk Score and Plan: 1 and Ondansetron and Treatment may vary due to age or medical condition  Airway Management Planned: Oral ETT  Additional Equipment:   Intra-op Plan:   Post-operative Plan: Extubation in OR  Informed Consent: I have reviewed the patients History and Physical, chart, labs and discussed the procedure including the risks, benefits and alternatives for the proposed anesthesia with the patient or authorized representative who has indicated his/her understanding and acceptance.   Dental advisory given  Plan  Discussed with: CRNA, Anesthesiologist and Surgeon  Anesthesia Plan Comments:         Anesthesia Quick Evaluation

## 2018-02-03 ENCOUNTER — Encounter (HOSPITAL_COMMUNITY): Payer: Self-pay

## 2018-02-03 ENCOUNTER — Inpatient Hospital Stay (HOSPITAL_COMMUNITY)
Admission: AD | Admit: 2018-02-03 | Discharge: 2018-02-04 | DRG: 134 | Disposition: A | Discharge status: Home / Self Care | Payer: BC Managed Care – PPO | Source: Ambulatory Visit | Attending: Otolaryngology | Admitting: Otolaryngology

## 2018-02-03 ENCOUNTER — Other Ambulatory Visit: Payer: Self-pay

## 2018-02-03 ENCOUNTER — Ambulatory Visit (HOSPITAL_COMMUNITY): Payer: BC Managed Care – PPO | Admitting: Anesthesiology

## 2018-02-03 ENCOUNTER — Encounter (HOSPITAL_COMMUNITY)
Admission: AD | Disposition: A | Discharge status: Home / Self Care | Payer: Self-pay | Source: Ambulatory Visit | Attending: Otolaryngology

## 2018-02-03 DIAGNOSIS — Z79899 Other long term (current) drug therapy: Secondary | ICD-10-CM | POA: Diagnosis not present

## 2018-02-03 DIAGNOSIS — Z9989 Dependence on other enabling machines and devices: Secondary | ICD-10-CM | POA: Diagnosis present

## 2018-02-03 DIAGNOSIS — J329 Chronic sinusitis, unspecified: Secondary | ICD-10-CM | POA: Diagnosis present

## 2018-02-03 DIAGNOSIS — F17210 Nicotine dependence, cigarettes, uncomplicated: Secondary | ICD-10-CM | POA: Diagnosis present

## 2018-02-03 DIAGNOSIS — J343 Hypertrophy of nasal turbinates: Secondary | ICD-10-CM | POA: Diagnosis present

## 2018-02-03 DIAGNOSIS — I10 Essential (primary) hypertension: Secondary | ICD-10-CM | POA: Diagnosis present

## 2018-02-03 DIAGNOSIS — G4733 Obstructive sleep apnea (adult) (pediatric): Secondary | ICD-10-CM | POA: Diagnosis present

## 2018-02-03 DIAGNOSIS — J342 Deviated nasal septum: Principal | ICD-10-CM | POA: Diagnosis present

## 2018-02-03 DIAGNOSIS — Z86711 Personal history of pulmonary embolism: Secondary | ICD-10-CM | POA: Diagnosis not present

## 2018-02-03 DIAGNOSIS — K219 Gastro-esophageal reflux disease without esophagitis: Secondary | ICD-10-CM | POA: Diagnosis present

## 2018-02-03 DIAGNOSIS — Z86718 Personal history of other venous thrombosis and embolism: Secondary | ICD-10-CM | POA: Diagnosis not present

## 2018-02-03 DIAGNOSIS — Z9889 Other specified postprocedural states: Secondary | ICD-10-CM

## 2018-02-03 HISTORY — PX: SINUS ENDO WITH FUSION: SHX5329

## 2018-02-03 HISTORY — PX: NASAL SEPTOPLASTY W/ TURBINOPLASTY: SHX2070

## 2018-02-03 LAB — CBC
HCT: 41.4 % (ref 39.0–52.0)
Hemoglobin: 14.9 g/dL (ref 13.0–17.0)
MCH: 32.6 pg (ref 26.0–34.0)
MCHC: 36 g/dL (ref 30.0–36.0)
MCV: 90.6 fL (ref 78.0–100.0)
Platelets: 170 10*3/uL (ref 150–400)
RBC: 4.57 MIL/uL (ref 4.22–5.81)
RDW: 15.5 % (ref 11.5–15.5)
WBC: 12.5 10*3/uL — ABNORMAL HIGH (ref 4.0–10.5)

## 2018-02-03 LAB — CREATININE, SERUM
Creatinine, Ser: 1.24 mg/dL (ref 0.61–1.24)
GFR calc Af Amer: >60 mL/min (ref >60)
GFR calc non Af Amer: >60 mL/min (ref >60)

## 2018-02-03 SURGERY — SEPTOPLASTY, NOSE, WITH NASAL TURBINATE REDUCTION
Anesthesia: General | Site: Nose

## 2018-02-03 MED ORDER — 0.9 % SODIUM CHLORIDE (POUR BTL) OPTIME
TOPICAL | Status: DC | PRN
  Administered 2018-02-03: 1000 mL

## 2018-02-03 MED ORDER — SUCCINYLCHOLINE CHLORIDE 200 MG/10ML IV SOSY
PREFILLED_SYRINGE | INTRAVENOUS | Status: AC
  Filled 2018-02-03: qty 20

## 2018-02-03 MED ORDER — ONDANSETRON HCL 4 MG/2ML IJ SOLN
INTRAMUSCULAR | Status: DC | PRN
  Administered 2018-02-03: 4 mg via INTRAVENOUS

## 2018-02-03 MED ORDER — ONDANSETRON HCL 4 MG/2ML IJ SOLN: 4.0000 mg | Freq: Once | INTRAMUSCULAR | Status: DC | PRN

## 2018-02-03 MED ORDER — HEPARIN SODIUM (PORCINE) 5000 UNIT/ML IJ SOLN
5000.0000 [IU] | Freq: Three times a day (TID) | INTRAMUSCULAR | Status: DC
  Filled 2018-02-03 (×2): qty 1

## 2018-02-03 MED ORDER — DEXAMETHASONE SODIUM PHOSPHATE 10 MG/ML IJ SOLN
INTRAMUSCULAR | Status: DC | PRN
  Administered 2018-02-03: 10 mg via INTRAVENOUS

## 2018-02-03 MED ORDER — CEPHALEXIN 500 MG PO CAPS
500.0000 mg | ORAL_CAPSULE | Freq: Three times a day (TID) | ORAL | Status: DC
  Administered 2018-02-03 (×3): 500 mg via ORAL
  Filled 2018-02-03 (×3): qty 1

## 2018-02-03 MED ORDER — BACITRACIN ZINC 500 UNIT/GM EX OINT
TOPICAL_OINTMENT | CUTANEOUS | Status: AC
  Filled 2018-02-03: qty 28.35

## 2018-02-03 MED ORDER — MODAFINIL 100 MG PO TABS
100.0000 mg | ORAL_TABLET | Freq: Every day | ORAL | Status: DC
  Filled 2018-02-03: qty 1

## 2018-02-03 MED ORDER — FENTANYL CITRATE (PF) 100 MCG/2ML IJ SOLN
25.0000 ug | INTRAMUSCULAR | Status: DC | PRN
  Administered 2018-02-03 (×2): 50 ug via INTRAVENOUS

## 2018-02-03 MED ORDER — NICOTINE 21 MG/24HR TD PT24
21.0000 mg | MEDICATED_PATCH | TRANSDERMAL | Status: DC
  Administered 2018-02-03: 21 mg via TRANSDERMAL
  Filled 2018-02-03: qty 1

## 2018-02-03 MED ORDER — EPHEDRINE 5 MG/ML INJ
INTRAVENOUS | Status: AC
  Filled 2018-02-03: qty 10

## 2018-02-03 MED ORDER — LIDOCAINE 2% (20 MG/ML) 5 ML SYRINGE
INTRAMUSCULAR | Status: DC | PRN
  Administered 2018-02-03: 80 mg via INTRAVENOUS

## 2018-02-03 MED ORDER — ROCURONIUM BROMIDE 10 MG/ML (PF) SYRINGE
PREFILLED_SYRINGE | INTRAVENOUS | Status: AC
  Filled 2018-02-03: qty 10

## 2018-02-03 MED ORDER — HYDROCODONE-ACETAMINOPHEN 5-325 MG PO TABS
1.0000 | ORAL_TABLET | ORAL | Status: DC | PRN
  Administered 2018-02-03: 2 via ORAL
  Administered 2018-02-03: 1 via ORAL
  Administered 2018-02-03 (×2): 2 via ORAL
  Administered 2018-02-04: 1 via ORAL
  Filled 2018-02-03 (×4): qty 2

## 2018-02-03 MED ORDER — FENTANYL CITRATE (PF) 250 MCG/5ML IJ SOLN
INTRAMUSCULAR | Status: AC
  Filled 2018-02-03: qty 5

## 2018-02-03 MED ORDER — MIDAZOLAM HCL 2 MG/2ML IJ SOLN
INTRAMUSCULAR | Status: DC | PRN
  Administered 2018-02-03: 2 mg via INTRAVENOUS

## 2018-02-03 MED ORDER — FENTANYL CITRATE (PF) 100 MCG/2ML IJ SOLN
INTRAMUSCULAR | Status: AC
  Administered 2018-02-03: 50 ug via INTRAVENOUS
  Filled 2018-02-03: qty 2

## 2018-02-03 MED ORDER — LIDOCAINE-EPINEPHRINE 1 %-1:100000 IJ SOLN
INTRAMUSCULAR | Status: AC
  Filled 2018-02-03: qty 1

## 2018-02-03 MED ORDER — AMLODIPINE BESYLATE 2.5 MG PO TABS
2.5000 mg | ORAL_TABLET | Freq: Every day | ORAL | Status: DC
  Filled 2018-02-03: qty 1

## 2018-02-03 MED ORDER — DEXTROSE-NACL 5-0.45 % IV SOLN
INTRAVENOUS | Status: DC
  Administered 2018-02-03: 12:00:00 via INTRAVENOUS

## 2018-02-03 MED ORDER — OXYCODONE HCL 5 MG PO TABS: 5.0000 mg | ORAL_TABLET | Freq: Once | ORAL | Status: DC | PRN

## 2018-02-03 MED ORDER — LIDOCAINE 2% (20 MG/ML) 5 ML SYRINGE
INTRAMUSCULAR | Status: AC
  Filled 2018-02-03: qty 10

## 2018-02-03 MED ORDER — OXYMETAZOLINE HCL 0.05 % NA SOLN
NASAL | Status: DC | PRN
  Administered 2018-02-03: 1 via TOPICAL

## 2018-02-03 MED ORDER — ONDANSETRON 4 MG PO TBDP: 4.0000 mg | ORAL_TABLET | Freq: Four times a day (QID) | ORAL | Status: DC | PRN

## 2018-02-03 MED ORDER — MEPERIDINE HCL 50 MG/ML IJ SOLN: 6.2500 mg | INTRAMUSCULAR | Status: DC | PRN

## 2018-02-03 MED ORDER — SUGAMMADEX SODIUM 500 MG/5ML IV SOLN
INTRAVENOUS | Status: AC
  Filled 2018-02-03: qty 5

## 2018-02-03 MED ORDER — SUGAMMADEX SODIUM 500 MG/5ML IV SOLN
INTRAVENOUS | Status: DC | PRN
  Administered 2018-02-03: 500 mg via INTRAVENOUS

## 2018-02-03 MED ORDER — BACITRACIN ZINC 500 UNIT/GM EX OINT
TOPICAL_OINTMENT | CUTANEOUS | Status: DC | PRN
  Administered 2018-02-03: 1 via TOPICAL

## 2018-02-03 MED ORDER — HYDROCODONE-ACETAMINOPHEN 5-325 MG PO TABS
ORAL_TABLET | ORAL | Status: AC
  Filled 2018-02-03: qty 2

## 2018-02-03 MED ORDER — HYDROCHLOROTHIAZIDE 25 MG PO TABS
25.0000 mg | ORAL_TABLET | Freq: Every day | ORAL | Status: DC
  Filled 2018-02-03: qty 1

## 2018-02-03 MED ORDER — PROPOFOL 10 MG/ML IV BOLUS
INTRAVENOUS | Status: AC
  Filled 2018-02-03: qty 20

## 2018-02-03 MED ORDER — DEXAMETHASONE SODIUM PHOSPHATE 10 MG/ML IJ SOLN
INTRAMUSCULAR | Status: AC
  Filled 2018-02-03: qty 1

## 2018-02-03 MED ORDER — ACETAMINOPHEN 160 MG/5ML PO SOLN: 325.0000 mg | ORAL | Status: DC | PRN

## 2018-02-03 MED ORDER — OXYCODONE HCL 5 MG/5ML PO SOLN: 5.0000 mg | Freq: Once | ORAL | Status: DC | PRN

## 2018-02-03 MED ORDER — OXYMETAZOLINE HCL 0.05 % NA SOLN
NASAL | Status: AC
  Filled 2018-02-03: qty 15

## 2018-02-03 MED ORDER — FENTANYL CITRATE (PF) 250 MCG/5ML IJ SOLN
INTRAMUSCULAR | Status: DC | PRN
  Administered 2018-02-03 (×3): 50 ug via INTRAVENOUS

## 2018-02-03 MED ORDER — ONDANSETRON HCL 4 MG/2ML IJ SOLN: 4.0000 mg | Freq: Four times a day (QID) | INTRAMUSCULAR | Status: DC | PRN

## 2018-02-03 MED ORDER — ACETAMINOPHEN 325 MG PO TABS: 325.0000 mg | ORAL_TABLET | ORAL | Status: DC | PRN

## 2018-02-03 MED ORDER — PROPOFOL 10 MG/ML IV BOLUS
INTRAVENOUS | Status: DC | PRN
  Administered 2018-02-03: 100 mg via INTRAVENOUS
  Administered 2018-02-03: 200 mg via INTRAVENOUS

## 2018-02-03 MED ORDER — PHENYLEPHRINE 40 MCG/ML (10ML) SYRINGE FOR IV PUSH (FOR BLOOD PRESSURE SUPPORT)
PREFILLED_SYRINGE | INTRAVENOUS | Status: AC
  Filled 2018-02-03: qty 10

## 2018-02-03 MED ORDER — ONDANSETRON HCL 4 MG/2ML IJ SOLN
INTRAMUSCULAR | Status: AC
  Filled 2018-02-03: qty 2

## 2018-02-03 MED ORDER — SUCCINYLCHOLINE CHLORIDE 20 MG/ML IJ SOLN
INTRAMUSCULAR | Status: DC | PRN
  Administered 2018-02-03: 200 mg via INTRAVENOUS

## 2018-02-03 MED ORDER — ROCURONIUM BROMIDE 10 MG/ML (PF) SYRINGE
PREFILLED_SYRINGE | INTRAVENOUS | Status: DC | PRN
  Administered 2018-02-03: 10 mg via INTRAVENOUS
  Administered 2018-02-03: 60 mg via INTRAVENOUS

## 2018-02-03 MED ORDER — LIDOCAINE-EPINEPHRINE 1 %-1:100000 IJ SOLN
INTRAMUSCULAR | Status: DC | PRN
  Administered 2018-02-03: 10 mL

## 2018-02-03 MED ORDER — LACTATED RINGERS IV SOLN
INTRAVENOUS | Status: DC | PRN
  Administered 2018-02-03: 07:00:00 via INTRAVENOUS

## 2018-02-03 MED ORDER — MIDAZOLAM HCL 2 MG/2ML IJ SOLN
INTRAMUSCULAR | Status: AC
  Filled 2018-02-03: qty 2

## 2018-02-03 MED ORDER — SODIUM CHLORIDE 0.9 % IR SOLN
Status: DC | PRN
  Administered 2018-02-03: 1000 mL

## 2018-02-03 SURGICAL SUPPLY — 56 items
ATTRACTOMAT 16X20 MAGNETIC DRP (DRAPES) IMPLANT
BLADE RAD40 ROTATE 4M 4 5PK (BLADE) ×2 IMPLANT
BLADE RAD40 ROTATE 4M 4MM 5PK (BLADE) ×1
BLADE RAD60 ROTATE M4 4 5PK (BLADE) IMPLANT
BLADE RAD60 ROTATE M4 4MM 5PK (BLADE)
BLADE ROTATE TRICUT 4MX13CM M4 (BLADE) ×1
BLADE ROTATE TRICUT 4X13 M4 (BLADE) ×2 IMPLANT
BLADE SURG 15 STRL LF DISP TIS (BLADE) IMPLANT
BLADE SURG 15 STRL SS (BLADE)
BLADE TRICUT 4MM (BLADE) ×3 IMPLANT
BLADE TRICUT ROTATE M4 4 5PK (BLADE) ×2 IMPLANT
BLADE TRICUT ROTATE M4 4MM 5PK (BLADE) ×1
CANISTER SUCT 3000ML PPV (MISCELLANEOUS) ×6 IMPLANT
COAGULATOR SUCT 6 FR SWTCH (ELECTROSURGICAL) ×2
COAGULATOR SUCT SWTCH 10FR 6 (ELECTROSURGICAL) ×4 IMPLANT
CONT SPEC 4OZ CLIKSEAL STRL BL (MISCELLANEOUS) ×3 IMPLANT
CRADLE DONUT ADULT HEAD (MISCELLANEOUS) IMPLANT
DRAPE HALF SHEET 40X57 (DRAPES) IMPLANT
DRESSING NASAL KENNEDY 3.5X.9 (MISCELLANEOUS) IMPLANT
DRESSING TELFA 8X10 (GAUZE/BANDAGES/DRESSINGS) IMPLANT
DRSG NASAL KENNEDY 3.5X.9 (MISCELLANEOUS)
DRSG NASOPORE 8CM (GAUZE/BANDAGES/DRESSINGS) ×3 IMPLANT
ELECT COATED BLADE 2.86 ST (ELECTRODE) IMPLANT
ELECT REM PT RETURN 9FT ADLT (ELECTROSURGICAL) ×3
ELECTRODE REM PT RTRN 9FT ADLT (ELECTROSURGICAL) ×1 IMPLANT
FILTER ARTHROSCOPY CONVERTOR (FILTER) IMPLANT
GLOVE BIO SURGEON STRL SZ7.5 (GLOVE) ×6 IMPLANT
GLOVE ECLIPSE 7.5 STRL STRAW (GLOVE) ×3 IMPLANT
GOWN BRE IMP SLV AUR LG STRL (GOWN DISPOSABLE) ×3 IMPLANT
GOWN STRL REUS W/ TWL LRG LVL3 (GOWN DISPOSABLE) ×2 IMPLANT
GOWN STRL REUS W/ TWL XL LVL3 (GOWN DISPOSABLE) ×1 IMPLANT
GOWN STRL REUS W/TWL LRG LVL3 (GOWN DISPOSABLE) ×4
GOWN STRL REUS W/TWL XL LVL3 (GOWN DISPOSABLE) ×2
KIT BASIN OR (CUSTOM PROCEDURE TRAY) ×3 IMPLANT
KIT TURNOVER KIT B (KITS) ×3 IMPLANT
NEEDLE HYPO 25GX1X1/2 BEV (NEEDLE) IMPLANT
NS IRRIG 1000ML POUR BTL (IV SOLUTION) ×3 IMPLANT
PAD ARMBOARD 7.5X6 YLW CONV (MISCELLANEOUS) ×6 IMPLANT
PATTIES SURGICAL .5 X3 (DISPOSABLE) ×6 IMPLANT
PENCIL FOOT CONTROL (ELECTRODE) IMPLANT
SOLUTION ANTI FOG 6CC (MISCELLANEOUS) ×3 IMPLANT
SPECIMEN JAR MEDIUM (MISCELLANEOUS) ×3 IMPLANT
SPECIMEN JAR SMALL (MISCELLANEOUS) ×6 IMPLANT
SUT CHROMIC 4 0 PS 2 18 (SUTURE) ×3 IMPLANT
SUT ETHILON 3 0 FSL (SUTURE) IMPLANT
SUT ETHILON 3 0 PS 1 (SUTURE) ×3 IMPLANT
SUT PLAIN 4 0 ~~LOC~~ 1 (SUTURE) ×3 IMPLANT
SWAB COLLECTION DEVICE MRSA (MISCELLANEOUS) IMPLANT
SWAB CULTURE ESWAB REG 1ML (MISCELLANEOUS) IMPLANT
TOWEL OR 17X24 6PK STRL BLUE (TOWEL DISPOSABLE) ×3 IMPLANT
TRACKER ENT INSTRUMENT (MISCELLANEOUS) ×3 IMPLANT
TRACKER ENT PATIENT (MISCELLANEOUS) ×3 IMPLANT
TRAY ENT MC OR (CUSTOM PROCEDURE TRAY) ×3 IMPLANT
TUBE CONNECTING 12'X1/4 (SUCTIONS) ×1
TUBE CONNECTING 12X1/4 (SUCTIONS) ×2 IMPLANT
WATER STERILE IRR 1000ML POUR (IV SOLUTION) ×3 IMPLANT

## 2018-02-03 NOTE — Anesthesia Procedure Notes (Signed)
Procedure Name: Intubation Date/Time: 02/03/2018 7:55 AM Performed by: Julieta Bellini, CRNA Pre-anesthesia Checklist: Patient identified, Emergency Drugs available, Suction available and Patient being monitored Patient Re-evaluated:Patient Re-evaluated prior to induction Oxygen Delivery Method: Circle system utilized Preoxygenation: Pre-oxygenation with 100% oxygen Induction Type: IV induction Ventilation: Mask ventilation without difficulty and Oral airway inserted - appropriate to patient size Laryngoscope Size: Mac and 4 Grade View: Grade II Tube type: Oral Tube size: 7.5 mm Number of attempts: 1 Airway Equipment and Method: Stylet Placement Confirmation: ETT inserted through vocal cords under direct vision,  positive ETCO2 and breath sounds checked- equal and bilateral Secured at: 24 cm Tube secured with: Tape Dental Injury: Teeth and Oropharynx as per pre-operative assessment

## 2018-02-03 NOTE — Transfer of Care (Signed)
Immediate Anesthesia Transfer of Care Note  Patient: Eduardo [name]  Procedure(s) Performed: NASAL SEPTOPLASTY WITH TURBINATE REDUCTION ([name] Nose) ENDOSCOPIC SINUS SURGERY WITH FUSION ([name] Nose)  Patient Location: PACU  Anesthesia Type:General  Level of Consciousness: awake, alert  and oriented  Airway & Oxygen Therapy: Patient Spontanous Breathing and Patient connected to nasal cannula oxygen  Post-op Assessment: Report given to RN and Post -op Vital signs reviewed and stable  Post vital signs: Reviewed and stable  Last Vitals:  Vitals Value Taken Time  BP 155/84 02/03/2018  9:58 AM  Temp 36.6 C 02/03/2018  9:58 AM  Pulse 99 02/03/2018 10:02 AM  Resp 10 02/03/2018 10:02 AM  SpO2 93 % 02/03/2018 10:02 AM  Vitals shown include unvalidated device data.  Last Pain:  Vitals:   02/03/18 0602  PainSc: 5       Patients Stated Pain Goal: 5 (72/09/47 0962)  Complications: No apparent anesthesia complications

## 2018-02-03 NOTE — Progress Notes (Signed)
1120 Received pt from PACU, A&O x4. Minimal nose bleeding noted, no nasal pad present, pt refused to place one. C/o pain but med not due yet. Ice pack given.

## 2018-02-03 NOTE — H&P (Signed)
Eduardo [name] is an 47 y.o. male.   Chief Complaint: sinusitis HPI: hx of sinus and nasal obstruction. He  Needs surgery  Past Medical History:  Diagnosis Date  . Anxiety   . Chronic back pain   . Degenerative disk disease   . Depression   . DVT of upper extremity (deep vein thrombosis) ([place])   . GERD (gastroesophageal reflux disease)   . Hypertension   . MRSA (methicillin resistant staph aureus) culture positive   . Peripheral neuropathy   . Pilonidal cyst   . Pulmonary embolus ([place])    no blood thinner 2002  . Sleep apnea    can't use cpap due to deviated nasal septumg    Past Surgical History:  Procedure Laterality Date  . Irrigation and debridement of back abscess    . PILONIDAL CYST DRAINAGE [name] 04/10/2017   Procedure: IRRIGATION AND DEBRIDEMENT BACK ABSCESS;  Surgeon: [name], MD;  Location: [facility];  Service: General;  Laterality: [name];  . THORACIC OUTLET SURGERY  2000  . WRIST FUSION  2002    Family History  Problem Relation Age of Onset  . Cancer Maternal Grandmother        breast, ovarian & colon  . Hypertension Mother   . Diabetes Mother   . Hypertension Father   . Heart disease Father   . Stroke Paternal Grandfather   . Neuropathy Neg Hx    Social History:  reports that he has been smoking cigarettes.  He has a 33.00 pack-year smoking history. He has never used smokeless tobacco. He reports that he does not drink alcohol or use drugs.  Allergies:  Allergies  Allergen Reactions  . Pollen Extract Cough  . Dust Mite Extract   . Other     Ragweed    Medications Prior to Admission  Medication Sig Dispense Refill  . acetaminophen (TYLENOL) 650 MG CR tablet Take 1,300 mg by mouth every 6 (six) hours as needed for pain.     [name] amLODipine (NORVASC) 2.5 MG tablet Take 1 tablet (2.5 mg total) by mouth daily. 90 tablet 2  . hydrochlorothiazide (HYDRODIURIL) 25 MG tablet Take 1 tablet (25 mg total) by mouth daily. 90 tablet 3  . levofloxacin (LEVAQUIN)  500 MG tablet Take 500 mg by mouth daily.    . modafinil (PROVIGIL) 100 MG tablet Take 1 tablet (100 mg total) by mouth daily. 30 tablet 2  . montelukast (SINGULAIR) 10 MG tablet Take 1 tablet (10 mg total) by mouth at bedtime. 90 tablet 3  . nortriptyline (PAMELOR) 75 MG capsule Take 2 capsules (150 mg total) by mouth at bedtime. 180 capsule 0  . Nutritional Supplements (JUICE PLUS FIBRE PO) Take 4 each by mouth daily with lunch.    . pregabalin (LYRICA) 300 MG capsule Take 1 capsule (300 mg total) by mouth 2 (two) times daily. 60 capsule 2  . traMADol (ULTRAM) 50 MG tablet Take 1 tablet (50 mg total) by mouth 2 (two) times daily. 60 tablet 5  . diphenhydramine-acetaminophen (TYLENOL PM) 25-500 MG TABS tablet You can use this at bedtime, instead of the regular Tylenol.    DO NOT TAKE MORE THAN 4000 MG OF TYLENOL PER DAY.  IT CAN HARM YOUR LIVER. (Patient not taking: Reported on 01/24/2018) 14 tablet   . fluticasone (FLONASE) 50 MCG/ACT nasal spray Place 2 sprays into both nostrils daily. (Patient not taking: Reported on 01/11/2018) 16 g 6    No results found for this or any  previous visit (from the past 48 hour(s)). No results found.  Review of Systems  Constitutional: Negative.   HENT: Negative.   Eyes: Negative.   Respiratory: Negative.   Cardiovascular: Negative.   Skin: Negative.     Blood pressure 135/87, pulse 86, temperature 98.6 F (37 C), resp. rate 20, height 6' 4.5" (1.943 m), weight (!) 155.6 kg (343 lb), SpO2 96 %. Physical Exam  Constitutional: He appears well-developed and well-nourished.  HENT:  Head: Normocephalic and atraumatic.  Mouth/Throat: Oropharynx is clear and moist.  Eyes: Pupils are equal, round, and reactive to light. Conjunctivae are normal.  Neck: Normal range of motion. Neck supple.  Cardiovascular: Normal rate.  Respiratory: Effort normal.  GI: Soft.  Musculoskeletal: Normal range of motion. He exhibits no edema.     Assessment/Plan Chronic  sinusitis- he has nasal obstruction and needs EES and septoplasty with SMR turbinates. Disucssed procedure and ready to proceed  [name], MD 02/03/2018, 7:30 AM

## 2018-02-03 NOTE — Op Note (Signed)
Preop/postop diagnosis: Chronic sinusitis, deviated septum, and turbinate hypertrophy Procedure: Bilateral max or antrostomy, bilateral total ethmoidectomy, bilateral frontal sinusotomy, fusion computer guidance, septoplasty, submucous resection of inferior turbinates, and biopsy of right nasal vestibule Anesthesia: Gen. Estimated blood loss: Approximately 100 mL Indications: 47 year old with chronic sinus problems that have created a issue with chronic nasal obstruction. He cannot tolerate his C Pap machine secondary to the significant nasal obstruction. He spell medical therapy. He was informed risk and benefits of the procedures and options were discussed all questions are answered and consent was obtained. Procedure: Patient was taken the operating room placed in the supine position and general endotracheal tube anesthesia was placed in the supine position prepped and draped in the usual sterile manner. The fusion computer guidance system was positioned calibrated with excellent accuracy. The oxymetazoline pledgets were placed into the nose bilaterally and the septum inferior turbinate middle turbinates were injected with 1% lidocaine with 1 100,000 epinephrine. A left hemitransfixion was performed raised a mucoperichondrial and ostial flap. The cartilage was divided about 2 center is posterior the caudal strut and the deviated portion the septum was removed. The Jansen-Middleton forceps were then used to remove the deviated portion of the bone and a large spur that was inferior deviating into the right side. This corrected the septal deflection. The sinus was then begun making a uncinectomy debridement with the microdebrider on the left side. The because she was very thickened and bloody. The uncinate was removed potassium the middle turbinate. Antrostomy was opened widely. There was no significant tissue or mucus within the maxillary sinus. The ethmoids were opened anteriorly opening the bulla and  dissecting through the ethmoid posteriorly. All the ethmoids were opened nicely. Computer fusion guidance used. The nasofrontal duct was then opened with the curved microdebrider and curved suction fusion guidance. It opened up nicely. All the mucosa was fairly thickened and bloody. Right size repeated the same fashion again uncinate removed and antrostomy opened widely and ethmoids dissected. Frontal sinuses also opened and entered easily. All mucosal also thickened. The oxymetazoline pledgets were placed into the ethmoids bilaterally. The turbinates are then removed by using the microdebrider and remove the submucosal material from anterior to posterior. Turbinates were both significantly hypertrophied. Suction cautery was then used to gain hemostasis along the edge of the turbinates and they were both outfractured. The hemitransfixion incision was closed with interrupted 4-0 chromic and a 40 plain gut quilting stitch placed to the septum. There was a small papilloma appearing lesion on the right lateral vestibule that was biopsied with the forceps and sent as a separate specimen. The Nasopor soaked in bacitracin was then placed into the ethmoids bilaterally. The nasopharynx was suctioned out of all blood and debris. The patient had good hemostasis. He was awake and brought to recovery in stable condition counts correct:

## 2018-02-03 NOTE — Anesthesia Postprocedure Evaluation (Signed)
Anesthesia Post Note  Patient: Eduardo [name]  Procedure(s) Performed: NASAL SEPTOPLASTY WITH TURBINATE REDUCTION ([name] Nose) ENDOSCOPIC SINUS SURGERY WITH FUSION ([name] Nose)     Patient location during evaluation: PACU Anesthesia Type: General Level of consciousness: awake and alert Pain management: pain level controlled Vital Signs Assessment: post-procedure vital signs reviewed and stable Respiratory status: spontaneous breathing, nonlabored ventilation, respiratory function stable and patient connected to nasal cannula oxygen Cardiovascular status: blood pressure returned to baseline and stable Postop Assessment: no apparent nausea or vomiting Anesthetic complications: no    Last Vitals:  Vitals:   02/03/18 1058 02/03/18 1121  BP:  (!) 143/95  Pulse:  93  Resp:    Temp: 36.6 C 36.8 C  SpO2: 93% 92%    Last Pain:  Vitals:   02/03/18 1121  TempSrc: Oral  PainSc:                  Stormy Sabol

## 2018-02-04 LAB — HIV ANTIBODY (ROUTINE TESTING W REFLEX): HIV Screen 4th Generation wRfx: NONREACTIVE

## 2018-02-04 MED ORDER — HYDROCODONE-ACETAMINOPHEN 5-325 MG PO TABS: 1.0000 | ORAL_TABLET | Freq: Four times a day (QID) | ORAL | 0 refills | Status: DC | PRN

## 2018-02-04 MED ORDER — CEPHALEXIN 500 MG PO CAPS: 500.0000 mg | ORAL_CAPSULE | Freq: Three times a day (TID) | ORAL | 0 refills | Status: DC

## 2018-02-04 NOTE — Discharge Summary (Signed)
Physician Discharge Summary  Patient ID: [name] MRN: [id number] DOB/AGE: 1970/10/23 47 y.o.  Admit date: 02/03/2018 Discharge date: 02/04/2018  Admission Diagnoses:sleep apnea/deviated septum/sinusitis  Discharge Diagnoses: same Active Problems:   Sleep apnea   Status post nasal septoplasty   Discharged Condition: good  Hospital Course: patient was admitted for observation secondary to obstructive sleep apnea. He underwent septoplasty, submucous resection inferior turbinates, and bilateral endoscopic sinus surgery. He did well overnight. No problems with breathing or pain. He now only has a very mild headache. He's able to maintain his pain control with just Tylenol he thinks. He is having minimal to no bleeding. Vision is intact. He has a little bit of nasal airway. He was discharged to follow-up in one week  Consults: None  Significant Diagnostic Studies: none  Treatments: as above  Discharge Exam: Blood pressure 128/90, pulse 94, temperature 98.9 F (37.2 C), temperature source Oral, resp. rate 18, height 6\' 4"  (1.93 m), weight (!) 149.7 kg (330 lb), SpO2 99 %. Awake and alert. He is ago. He has no bleeding. He has a patent nasal airway. No swelling of his face. Vision is intact. Cardiovascular-regular. Lungs-normal effort abdomen soft. Extremities no swelling or tenderness  Disposition:    He is to follow-up in one week to the office. Do not take narcotics unless absolutely necessary.   Signed: [name] 02/04/2018, 6:43 AM

## 2018-02-04 NOTE — Progress Notes (Addendum)
61 Pt is anxious to go home, wants to go home ASAP. Discharge instructions given. His prescriptions was sent to [facility] but pt said they are closed today and opens on Monday.  He wants to get his prescribed meds to [facility] at [place].  Will send a message to Dr Janace Hoard.  Pt is discharged accompanied by his mother.  1145 I talked to Tiffany, Dr Janace Hoard assistant at the office, she will call pt's prescription to [facility] at [facility]

## 2018-02-07 ENCOUNTER — Encounter (HOSPITAL_COMMUNITY): Payer: Self-pay | Admitting: Otolaryngology

## 2018-02-08 NOTE — Progress Notes (Signed)
Subjective:    Patient ID: Eduardo [name], male    DOB: 1971-08-14, 47 y.o.   MRN: [id number]  HPI: 11/11/17 OV:  Eduardo [name] is here for CPE. He reports medication compliance, denies SE. He continues to have extremity numbness/tingling and it is managed with Pregabalalin 300mg  BID, he has tried Cymbalta in past without sx relief. He has several complaints today 1) Snoring 2) Difficultly breathing through nares 3) R scrotal "knot"- he noticed 6-8 months ago, not tender to the touch 4) L scrotal pain- that worsens with palpation and lifting 5) Umbilical hernia- he noticed 6-8 months ago He estimates to drink 30-40 oz water/day He eats only one large meal in the evening He walks 2-3 miles/day at work M-F He has lost >20 lbs since last OV in Dec 2018 He continues to smoke pack/day, he still has extra Nicoderm patches at home. Fasting labs drawn today.  PHQ reviewed - denies thoughts of harming himself/others. He denies ETOH/durg use, last narcotic use was in 2014 He denies any hx of arrest/assualt/DUI- he is applying for concealed carry license  7/98/92 OV: Eduardo [name] presents for regular f/u- HTN, anxiety/depression. He is taking all antihypertensives as directed, denies CP/dyspnea/palpitations. He underwent septoplasty 02/03/18, he was seen by ENT/Dr. Janace Hoard today and has final f/u in 2 weeks. Last dose of narcotic pain medication was yeday, he reports only using it sparingly.  He has been experiencing nighttime urinary incontinence r/t nortriptyline, so he has stopped rx about 1-2 weeks ago. He reports continues daytime drowsiness and cannot wear CPAP due to sig claustrophobia. He continues to smoke >pack/day- he has previously failed Nicoderm patches, Wellbutrin, and Chantix  Patient Care Team    Relationship Specialty Notifications Start End  [place], Valetta Fuller D, NP PCP - General Family Medicine  03/03/17   Allyn Kenner, MD  Dermatology  04/06/17   Charlett Blake, MD  Consulting Physician Physical Medicine and Rehabilitation  11/11/17     Patient Active Problem List   Diagnosis Date Noted  . Low libido 02/09/2018  . Sleep apnea 02/03/2018  . Status post nasal septoplasty 02/03/2018  . Deviated septum 11/11/2017  . Umbilical hernia without obstruction and without gangrene 11/11/2017  . Scrotal pain 11/11/2017  . Scrotal cyst 11/11/2017  . Screening, lipid 11/11/2017  . Screening for diabetes mellitus (DM) 11/11/2017  . Screening for thyroid disorder 11/11/2017  . Anxiety and depression 08/11/2017  . Acute maxillary sinusitis 08/11/2017  . 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 Lumbar degenerative disc disease 08/02/2017  . Acute left ankle pain 07/12/2017  . Skin infection 06/03/2017  . Chronic folliculitis 11/94/1740  . [place] 04/10/2017  . Cellulitis and abscess of trunk s/p I&D 04/10/2017 04/06/2017  . Fever chills 04/06/2017  . History of MRSA infection 04/06/2017  . Physical exam, annual 03/03/2017  . Neuropathy 09/28/2015  . Major depression 12/26/2012  . Generalized anxiety disorder 12/26/2012  . Opiate dependence ([place]) 12/24/2012  . Benzodiazepine dependence ([place]) 12/24/2012  . HTN (hypertension) 12/24/2012  . 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 OBESITY 09/09/2010  . TOBACCO ABUSE 09/09/2010  . LOW BACK PAIN, CHRONIC 09/09/2010     Past Medical History:  Diagnosis Date  . Anxiety   . Chronic back pain   . Degenerative disk disease   . Depression   . DVT of upper extremity (deep vein thrombosis) ([place])   . GERD (gastroesophageal reflux disease)   . Hypertension   . MRSA (methicillin resistant staph aureus) culture  positive   . Peripheral neuropathy   . Pilonidal cyst   . Pulmonary embolus ([place])    no blood thinner 2002  . Sleep apnea    can't use cpap due to deviated nasal septumg     Past Surgical History:  Procedure Laterality Date  . Irrigation and debridement of back abscess    . NASAL  SEPTOPLASTY W/ TURBINOPLASTY [name] 02/03/2018   Procedure: NASAL SEPTOPLASTY WITH TURBINATE REDUCTION;  Surgeon: [name], MD;  Location: [place];  Service: ENT;  Laterality: [name];  . PILONIDAL CYST DRAINAGE [name] 04/10/2017   Procedure: IRRIGATION AND DEBRIDEMENT BACK ABSCESS;  Surgeon: [name], MD;  Location: [facility];  Service: General;  Laterality: [name];  . SINUS ENDO WITH FUSION [name] 02/03/2018   Procedure: ENDOSCOPIC SINUS SURGERY WITH FUSION;  Surgeon: [name], MD;  Location: [place];  Service: ENT;  Laterality: [name];  . THORACIC OUTLET SURGERY  2000  . WRIST FUSION  2002     Family History  Problem Relation Age of Onset  . Cancer Maternal Grandmother        breast, ovarian & colon  . Hypertension Mother   . Diabetes Mother   . Hypertension Father   . Heart disease Father   . Stroke Paternal Grandfather   . Neuropathy Neg Hx      Social History   Substance and Sexual Activity  Drug Use No     Social History   Substance and Sexual Activity  Alcohol Use No  . Alcohol/week: 0.0 oz   Comment: Rarely     Social History   Tobacco Use  Smoking Status Current Every Day Smoker  . Packs/day: 1.00  . Years: 33.00  . Pack years: 33.00  . Types: Cigarettes  Smokeless Tobacco Never Used     Outpatient Encounter Medications as of 02/09/2018  Medication Sig Note  . amLODipine (NORVASC) 2.5 MG tablet Take 1 tablet (2.5 mg total) by mouth daily.   . fluticasone (FLONASE) 50 MCG/ACT nasal spray Place 2 sprays into both nostrils daily.   . hydrochlorothiazide (HYDRODIURIL) 25 MG tablet Take 1 tablet (25 mg total) by mouth daily.   . modafinil (PROVIGIL) 100 MG tablet Take 1 tablet (100 mg total) by mouth daily.   . montelukast (SINGULAIR) 10 MG tablet Take 1 tablet (10 mg total) by mouth at bedtime.   . Nutritional Supplements (JUICE PLUS FIBRE PO) Take 4 each by mouth daily with lunch. 01/24/2018: Fruit and vegatable  . pregabalin (LYRICA) 300 MG capsule Take 1 capsule (300  mg total) by mouth 2 (two) times daily.   . traMADol (ULTRAM) 50 MG tablet Take 1 tablet (50 mg total) by mouth 2 (two) times daily.   . [DISCONTINUED] cephALEXin (KEFLEX) 500 MG capsule Take 1 capsule (500 mg total) by mouth 3 (three) times daily.   . [DISCONTINUED] HYDROcodone-acetaminophen (NORCO/VICODIN) 5-325 MG tablet Take 1 tablet by mouth every 6 (six) hours as needed for moderate pain.   . [DISCONTINUED] levofloxacin (LEVAQUIN) 500 MG tablet Take 500 mg by mouth daily. 01/24/2018: For 14 days  . [DISCONTINUED] nortriptyline (PAMELOR) 75 MG capsule Take 2 capsules (150 mg total) by mouth at bedtime.    No facility-administered encounter medications on file as of 02/09/2018.     Allergies: Pollen extract; Dust mite extract; and Other  Body mass index is 43.84 kg/m.  Blood pressure 120/71, pulse 92, height 6' 2.75" (1.899 m), weight (!) 348 lb 6.4 oz (158 kg), SpO2 97 %.  Review of Systems  Constitutional: Positive for fatigue. Negative for activity change, appetite change, chills, diaphoresis, fever and unexpected weight change.  HENT: Negative for congestion.   Eyes: Negative for visual disturbance.  Respiratory: Positive for cough. Negative for chest tightness, shortness of breath, wheezing and stridor.   Cardiovascular: Negative for chest pain, palpitations and leg swelling.  Gastrointestinal: Negative for abdominal distention, abdominal pain, blood in stool, constipation, diarrhea, nausea and vomiting.  Genitourinary: Positive for scrotal swelling and testicular pain. Negative for difficulty urinating, discharge, flank pain, hematuria, penile pain and penile swelling.  Musculoskeletal: Positive for arthralgias, back pain, gait problem, joint swelling, myalgias, neck pain and neck stiffness.  Skin: Negative for color change, pallor, rash and wound.  Neurological: Negative for dizziness and headaches.  Hematological: Does not bruise/bleed easily.  Psychiatric/Behavioral:  Negative for decreased concentration, dysphoric mood, hallucinations, self-injury, sleep disturbance and suicidal ideas. The patient is not nervous/anxious and is not hyperactive.        Objective:   Physical Exam  Constitutional: He is oriented to person, place, and time. He appears well-developed and well-nourished. No distress.  HENT:  Head: Normocephalic and atraumatic.  Right Ear: Hearing, tympanic membrane, external ear and ear canal normal. Tympanic membrane is not erythematous and not bulging. No decreased hearing is noted.  Left Ear: Hearing, tympanic membrane, external ear and ear canal normal. Tympanic membrane is not erythematous and not bulging. No decreased hearing is noted.  Nose: Septal deviation present. Right sinus exhibits no frontal sinus tenderness. Left sinus exhibits no frontal sinus tenderness.  Mouth/Throat: Uvula is midline and oropharynx is clear and moist. Dental caries present.  Eyes: Pupils are equal, round, and reactive to light. Conjunctivae are normal.  Neck: Normal range of motion. Neck supple.  Cardiovascular: Normal rate, regular rhythm, normal heart sounds and intact distal pulses.  No murmur heard. Pulmonary/Chest: Effort normal and breath sounds normal. No respiratory distress. He has no wheezes. He has no rales. He exhibits no tenderness.  Abdominal: Soft. Bowel sounds are normal. He exhibits no distension and no mass. There is no tenderness. There is no rebound, no guarding and no CVA tenderness. A hernia is present. Hernia confirmed positive in the ventral area. Hernia confirmed negative in the right inguinal area and confirmed negative in the left inguinal area.  Large, reducible, non-tender umbilical hernia  Genitourinary: Right testis shows mass. Right testis shows no tenderness. Left testis shows tenderness. No penile tenderness.  Genitourinary Comments: Chaperone present during examination.  Musculoskeletal: He exhibits edema and tenderness.        Right ankle: He exhibits swelling.       Left ankle: He exhibits swelling.       Right lower leg: He exhibits swelling.       Left lower leg: He exhibits swelling.       Right foot: There is swelling.       Left foot: There is swelling.  Lymphadenopathy:    He has no cervical adenopathy.  Neurological: He is alert and oriented to person, place, and time.  Skin: Skin is warm and dry. No rash noted. He is not diaphoretic. No erythema. No pallor.  Psychiatric: He has a normal mood and affect. His behavior is normal. Judgment and thought content normal.  Nursing note and vitals reviewed.         Assessment & Plan:   1. Essential hypertension   2. Deviated septum   3. Sleep apnea, unspecified type   4. TOBACCO ABUSE  5. Low libido     HTN (hypertension) BP at goal 120/71, HR 92 Continue amlodipine 2.5mg  QD and HCTZ 25mg  QD Reduce- stop tobacco use  Deviated septum Septoplasty with Dr. Janace Hoard on 02/03/18 Has final f/u in 2 weeks  Sleep apnea Strongly recommend nightly CPAP  TOBACCO ABUSE He continues to smoke >pack/day- he has previously failed Nicoderm patches, Wellbutrin, and Chantix Encouraged to try to at least try reduce tobacco use   Low libido Recommend to re-visit CPAP usage after fully recovered from  Septoplasty. Reduce-stop tobacco use Follow heart healthy diet Increase regular exercise, again after fully recovered from Septoplasty BP is much better controlled- great!     FOLLOW-UP:  Return in about 8 months (around 10/11/2018) for Regular Follow Up.

## 2018-02-09 ENCOUNTER — Ambulatory Visit (INDEPENDENT_AMBULATORY_CARE_PROVIDER_SITE_OTHER): Payer: BC Managed Care – PPO | Admitting: Adult Health

## 2018-02-09 ENCOUNTER — Encounter: Payer: Self-pay | Admitting: Adult Health

## 2018-02-09 VITALS — BP 120/71 | HR 92 | Ht 74.75 in | Wt 348.4 lb

## 2018-02-09 DIAGNOSIS — J342 Deviated nasal septum: Secondary | ICD-10-CM

## 2018-02-09 DIAGNOSIS — F17200 Nicotine dependence, unspecified, uncomplicated: Secondary | ICD-10-CM | POA: Diagnosis not present

## 2018-02-09 DIAGNOSIS — I10 Essential (primary) hypertension: Secondary | ICD-10-CM | POA: Diagnosis not present

## 2018-02-09 DIAGNOSIS — G4730 Sleep apnea, unspecified: Secondary | ICD-10-CM

## 2018-02-09 DIAGNOSIS — R6882 Decreased libido: Secondary | ICD-10-CM | POA: Diagnosis not present

## 2018-02-09 NOTE — Assessment & Plan Note (Signed)
He continues to smoke >pack/day- he has previously failed Nicoderm patches, Wellbutrin, and Chantix Encouraged to try to at least try reduce tobacco use

## 2018-02-09 NOTE — Assessment & Plan Note (Addendum)
Strongly recommend nightly CPAP

## 2018-02-09 NOTE — Assessment & Plan Note (Addendum)
Recommend to re-visit CPAP usage after fully recovered from  Septoplasty. Reduce-stop tobacco use Follow heart healthy diet Increase regular exercise, again after fully recovered from Septoplasty BP is much better controlled- great!

## 2018-02-09 NOTE — Assessment & Plan Note (Signed)
BP at goal 120/71, HR 92 Continue amlodipine 2.5mg  QD and HCTZ 25mg  QD Reduce- stop tobacco use

## 2018-02-09 NOTE — Assessment & Plan Note (Addendum)
Septoplasty with Dr. Janace Hoard on 02/03/18 Has final f/u in 2 weeks

## 2018-02-09 NOTE — Patient Instructions (Addendum)
Hypertension Hypertension, commonly called high blood pressure, is when the force of blood pumping through the arteries is too strong. The arteries are the blood vessels that carry blood from the heart throughout the body. Hypertension forces the heart to work harder to pump blood and may cause arteries to become narrow or stiff. Having untreated or uncontrolled hypertension can cause heart attacks, strokes, kidney disease, and other problems. A blood pressure reading consists of a higher number over a lower number. Ideally, your blood pressure should be below 120/80. The first ("top") number is called the systolic pressure. It is a measure of the pressure in your arteries as your heart beats. The second ("bottom") number is called the diastolic pressure. It is a measure of the pressure in your arteries as the heart relaxes. What are the causes? The cause of this condition is not known. What increases the risk? Some risk factors for high blood pressure are under your control. Others are not. Factors you can change  Smoking.  Having type 2 diabetes mellitus, high cholesterol, or both.  Not getting enough exercise or physical activity.  Being overweight.  Having too much fat, sugar, calories, or salt (sodium) in your diet.  Drinking too much alcohol. Factors that are difficult or impossible to change  Having chronic kidney disease.  Having a family history of high blood pressure.  Age. Risk increases with age.  Race. You may be at higher risk if you are African-American.  Gender. Men are at higher risk than women before age 45. After age 65, women are at higher risk than men.  Having obstructive sleep apnea.  Stress. What are the signs or symptoms? Extremely high blood pressure (hypertensive crisis) may cause:  Headache.  Anxiety.  Shortness of breath.  Nosebleed.  Nausea and vomiting.  Severe chest pain.  Jerky movements you cannot control (seizures).  How is this  diagnosed? This condition is diagnosed by measuring your blood pressure while you are seated, with your arm resting on a surface. The cuff of the blood pressure monitor will be placed directly against the skin of your upper arm at the level of your heart. It should be measured at least twice using the same arm. Certain conditions can cause a difference in blood pressure between your right and left arms. Certain factors can cause blood pressure readings to be lower or higher than normal (elevated) for a short period of time:  When your blood pressure is higher when you are in a health care provider's office than when you are at home, this is called white coat hypertension. Most people with this condition do not need medicines.  When your blood pressure is higher at home than when you are in a health care provider's office, this is called masked hypertension. Most people with this condition may need medicines to control blood pressure.  If you have a high blood pressure reading during one visit or you have normal blood pressure with other risk factors:  You may be asked to return on a different day to have your blood pressure checked again.  You may be asked to monitor your blood pressure at home for 1 week or longer.  If you are diagnosed with hypertension, you may have other blood or imaging tests to help your health care provider understand your overall risk for other conditions. How is this treated? This condition is treated by making healthy lifestyle changes, such as eating healthy foods, exercising more, and reducing your alcohol intake. Your   health care provider may prescribe medicine if lifestyle changes are not enough to get your blood pressure under control, and if:  Your systolic blood pressure is above 130.  Your diastolic blood pressure is above 80.  Your personal target blood pressure may vary depending on your medical conditions, your age, and other factors. Follow these  instructions at home: Eating and drinking  Eat a diet that is high in fiber and potassium, and low in sodium, added sugar, and fat. An example eating plan is called the DASH (Dietary Approaches to Stop Hypertension) diet. To eat this way: ? Eat plenty of fresh fruits and vegetables. Try to fill half of your plate at each meal with fruits and vegetables. ? Eat whole grains, such as whole wheat pasta, brown rice, or whole grain bread. Fill about one quarter of your plate with whole grains. ? Eat or drink low-fat dairy products, such as skim milk or low-fat yogurt. ? Avoid fatty cuts of meat, processed or cured meats, and poultry with skin. Fill about one quarter of your plate with lean proteins, such as fish, chicken without skin, beans, eggs, and tofu. ? Avoid premade and processed foods. These tend to be higher in sodium, added sugar, and fat.  Reduce your daily sodium intake. Most people with hypertension should eat less than 1,500 mg of sodium a day.  Limit alcohol intake to no more than 1 drink a day for nonpregnant women and 2 drinks a day for men. One drink equals 12 oz of beer, 5 oz of wine, or 1 oz of hard liquor. Lifestyle  Work with your health care provider to maintain a healthy body weight or to lose weight. Ask what an ideal weight is for you.  Get at least 30 minutes of exercise that causes your heart to beat faster (aerobic exercise) most days of the week. Activities may include walking, swimming, or biking.  Include exercise to strengthen your muscles (resistance exercise), such as pilates or lifting weights, as part of your weekly exercise routine. Try to do these types of exercises for 30 minutes at least 3 days a week.  Do not use any products that contain nicotine or tobacco, such as cigarettes and e-cigarettes. If you need help quitting, ask your health care provider.  Monitor your blood pressure at home as told by your health care provider.  Keep all follow-up visits as  told by your health care provider. This is important. Medicines  Take over-the-counter and prescription medicines only as told by your health care provider. Follow directions carefully. Blood pressure medicines must be taken as prescribed.  Do not skip doses of blood pressure medicine. Doing this puts you at risk for problems and can make the medicine less effective.  Ask your health care provider about side effects or reactions to medicines that you should watch for. Contact a health care provider if:  You think you are having a reaction to a medicine you are taking.  You have headaches that keep coming back (recurring).  You feel dizzy.  You have swelling in your ankles.  You have trouble with your vision. Get help right away if:  You develop a severe headache or confusion.  You have unusual weakness or numbness.  You feel faint.  You have severe pain in your chest or abdomen.  You vomit repeatedly.  You have trouble breathing. Summary  Hypertension is when the force of blood pumping through your arteries is too strong. If this condition is not   controlled, it may put you at risk for serious complications.  Your personal target blood pressure may vary depending on your medical conditions, your age, and other factors. For most people, a normal blood pressure is less than 120/80.  Hypertension is treated with lifestyle changes, medicines, or a combination of both. Lifestyle changes include weight loss, eating a healthy, low-sodium diet, exercising more, and limiting alcohol. This information is not intended to replace advice given to you by your health care provider. Make sure you discuss any questions you have with your health care provider. Document Released: 10/05/2005 Document Revised: 09/02/2016 Document Reviewed: 09/02/2016 Elsevier Interactive Patient Education  2018 [employer].   Fatigue Fatigue is feeling tired all of the time, a lack of energy, or a lack of  motivation. Occasional or mild fatigue is often a normal response to activity or life in general. However, long-lasting (chronic) or extreme fatigue may indicate an underlying medical condition. Follow these instructions at home: Watch your fatigue for any changes. The following actions may help to lessen any discomfort you are feeling:  Talk to your health care provider about how much sleep you need each night. Try to get the required amount every night.  Take medicines only as directed by your health care provider.  Eat a healthy and nutritious diet. Ask your health care provider if you need help changing your diet.  Drink enough fluid to keep your urine clear or pale yellow.  Practice ways of relaxing, such as yoga, meditation, massage therapy, or acupuncture.  Exercise regularly.  Change situations that cause you stress. Try to keep your work and personal routine reasonable.  Do not abuse illegal drugs.  Limit alcohol intake to no more than 1 drink per day for nonpregnant women and 2 drinks per day for men. One drink equals 12 ounces of beer, 5 ounces of wine, or 1 ounces of hard liquor.  Take a multivitamin, if directed by your health care provider.  Contact a health care provider if:  Your fatigue does not get better.  You have a fever.  You have unintentional weight loss or gain.  You have headaches.  You have difficulty: ? Falling asleep. ? Sleeping throughout the night.  You feel angry, guilty, anxious, or sad.  You are unable to have a bowel movement (constipation).  You skin is dry.  Your legs or another part of your body is swollen. Get help right away if:  You feel confused.  Your vision is blurry.  You feel faint or pass out.  You have a severe headache.  You have severe abdominal, pelvic, or back pain.  You have chest pain, shortness of breath, or an irregular or fast heartbeat.  You are unable to urinate or you urinate less than  normal.  You develop abnormal bleeding, such as bleeding from the rectum, vagina, nose, lungs, or nipples.  You vomit blood.  You have thoughts about harming yourself or committing suicide.  You are worried that you might harm someone else. This information is not intended to replace advice given to you by your health care provider. Make sure you discuss any questions you have with your health care provider. Document Released: 08/02/2007 Document Revised: 03/12/2016 Document Reviewed: 02/06/2014 Elsevier Interactive Patient Education  2018 [employer] all medications as directed. Follow-up with ENT as directed. Reduce-stop tobacco use- YOU CAN DO IT! Follow-up with psychiatry to discuss medication side effects, fatigue, and ADHD. Follow-up 8 months for complete physical with  fasting labs. NICE TO SEE YOU!

## 2018-02-16 ENCOUNTER — Other Ambulatory Visit: Payer: Self-pay | Admitting: Physical Medicine & Rehabilitation

## 2018-02-16 DIAGNOSIS — E538 Deficiency of other specified B group vitamins: Secondary | ICD-10-CM

## 2018-02-16 DIAGNOSIS — G609 Hereditary and idiopathic neuropathy, unspecified: Secondary | ICD-10-CM

## 2018-03-08 ENCOUNTER — Ambulatory Visit (INDEPENDENT_AMBULATORY_CARE_PROVIDER_SITE_OTHER): Payer: BC Managed Care – PPO | Admitting: Adult Health

## 2018-03-08 ENCOUNTER — Encounter: Payer: Self-pay | Admitting: Adult Health

## 2018-03-08 ENCOUNTER — Ambulatory Visit: Payer: BC Managed Care – PPO

## 2018-03-08 VITALS — BP 128/78 | HR 86 | Ht 74.75 in | Wt 345.4 lb

## 2018-03-08 DIAGNOSIS — M79675 Pain in left toe(s): Secondary | ICD-10-CM

## 2018-03-08 DIAGNOSIS — M109 Gout, unspecified: Secondary | ICD-10-CM | POA: Insufficient documentation

## 2018-03-08 MED ORDER — COLCHICINE 0.6 MG PO TABS: ORAL_TABLET | ORAL | 0 refills | Status: DC

## 2018-03-08 NOTE — Progress Notes (Addendum)
Subjective:    Patient ID: Eduardo [name], male    DOB: 1971-05-01, 47 y.o.   MRN: [id number]  HPI: [name] presents with L great toe pain that spontaneously developed yesterday afternoon- he denies acute injury/accident prior to onset of pain. He reports pain starts at L great toe and radiates to lateral malleolus. He reports only mild pain at rest, rated 2/10 that will increase to 8/10 with ambulation/standing. Pain is described as "sharp and ripping". He also reports swelling and warmth over L great toe  He reports eating shrimp over the weekend He denies personal/family hx of gout  He continues to use tobacco and abstains from ETOH He has been elevating LLE, applying ice, and using Tramadol as needed.  Patient Care Team    Relationship Specialty Notifications Start End  Eduardo [name] D, NP PCP - General Family Medicine  03/03/17   Eduardo Kenner, MD  Dermatology  04/06/17   Eduardo Blake, MD Consulting Physician Physical Medicine and Rehabilitation  11/11/17     Patient Active Problem List   Diagnosis Date Noted  . Great toe pain, left 03/08/2018  . Acute gout involving toe of left foot 03/08/2018  . Low libido 02/09/2018  . Sleep apnea 02/03/2018  . Status post nasal septoplasty 02/03/2018  . Deviated septum 11/11/2017  . Umbilical hernia without obstruction and without gangrene 11/11/2017  . Scrotal pain 11/11/2017  . Scrotal cyst 11/11/2017  . Screening, lipid 11/11/2017  . Screening for diabetes mellitus (DM) 11/11/2017  . Screening for thyroid disorder 11/11/2017  . Anxiety and depression 08/11/2017  . Acute maxillary sinusitis 08/11/2017  . 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 Lumbar degenerative disc disease 08/02/2017  . Acute left ankle pain 07/12/2017  . Skin infection 06/03/2017  . Chronic folliculitis 35/36/1443  . [place] 04/10/2017  . Cellulitis and abscess of trunk s/p I&D 04/10/2017 04/06/2017  . Fever chills 04/06/2017  .  History of MRSA infection 04/06/2017  . Physical exam, annual 03/03/2017  . Neuropathy 09/28/2015  . Major depression 12/26/2012  . Generalized anxiety disorder 12/26/2012  . Opiate dependence ([place]) 12/24/2012  . Benzodiazepine dependence ([place]) 12/24/2012  . HTN (hypertension) 12/24/2012  . 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 OBESITY 09/09/2010  . TOBACCO ABUSE 09/09/2010  . LOW BACK PAIN, CHRONIC 09/09/2010     Past Medical History:  Diagnosis Date  . Anxiety   . Chronic back pain   . Degenerative disk disease   . Depression   . DVT of upper extremity (deep vein thrombosis) ([place])   . GERD (gastroesophageal reflux disease)   . Hypertension   . MRSA (methicillin resistant staph aureus) culture positive   . Peripheral neuropathy   . Pilonidal cyst   . Pulmonary embolus ([place])    no blood thinner 2002  . Sleep apnea    can't use cpap due to deviated nasal septumg     Past Surgical History:  Procedure Laterality Date  . Irrigation and debridement of back abscess    . NASAL SEPTOPLASTY W/ TURBINOPLASTY [name] 02/03/2018   Procedure: NASAL SEPTOPLASTY WITH TURBINATE REDUCTION;  Surgeon: [name], MD;  Location: [facility];  Service: ENT;  Laterality: [name];  . PILONIDAL CYST DRAINAGE [name] 04/10/2017   Procedure: IRRIGATION AND DEBRIDEMENT BACK ABSCESS;  Surgeon: [name], MD;  Location: [facility];  Service: General;  Laterality: [name];  . SINUS ENDO WITH FUSION [name] 02/03/2018   Procedure: ENDOSCOPIC SINUS SURGERY WITH FUSION;  Surgeon: [name], MD;  Location: [facility];  Service: ENT;  Laterality: [name];  . THORACIC OUTLET SURGERY  2000  . WRIST FUSION  2002     Family History  Problem Relation Age of Onset  . Cancer Maternal Grandmother        breast, ovarian & colon  . Hypertension Mother   . Diabetes Mother   . Hypertension Father   . Heart disease Father   . Stroke Paternal Grandfather   . Neuropathy Neg Hx      Social History   Substance and Sexual Activity  Drug  Use No     Social History   Substance and Sexual Activity  Alcohol Use No  . Alcohol/week: 0.0 oz   Comment: Rarely     Social History   Tobacco Use  Smoking Status Current Every Day Smoker  . Packs/day: 1.00  . Years: 33.00  . Pack years: 33.00  . Types: Cigarettes  Smokeless Tobacco Never Used     Outpatient Encounter Medications as of 03/08/2018  Medication Sig Note  . amLODipine (NORVASC) 2.5 MG tablet Take 1 tablet (2.5 mg total) by mouth daily.   . fluticasone (FLONASE) 50 MCG/ACT nasal spray Place 2 sprays into both nostrils daily.   . hydrochlorothiazide (HYDRODIURIL) 25 MG tablet Take 1 tablet (25 mg total) by mouth daily.   [name] LYRICA 300 MG capsule TAKE 1 CAPSULE BY MOUTH TWICE A DAY   . modafinil (PROVIGIL) 100 MG tablet Take 1 tablet (100 mg total) by mouth daily.   . montelukast (SINGULAIR) 10 MG tablet Take 1 tablet (10 mg total) by mouth at bedtime.   . Nutritional Supplements (JUICE PLUS FIBRE PO) Take 4 each by mouth daily with lunch. 01/24/2018: Fruit and vegatable  . traMADol (ULTRAM) 50 MG tablet Take 1 tablet (50 mg total) by mouth 2 (two) times daily.   . colchicine 0.6 MG tablet Take two tabs initially at onset of flare.  May repeat in one hr if needed.  Max daily dose 1.8mg /day.    No facility-administered encounter medications on file as of 03/08/2018.     Allergies: Pollen extract; Dust mite extract; and Other  Body mass index is 43.46 kg/m.  Blood pressure 128/78, pulse 86, height 6' 2.75" (1.899 m), weight (!) 345 lb 6.4 oz (156.7 kg), SpO2 95 %.  Review of Systems  Constitutional: Positive for activity change and fatigue. Negative for appetite change, chills, diaphoresis, fever and unexpected weight change.  Musculoskeletal: Positive for arthralgias, back pain, gait problem, joint swelling and myalgias. Negative for neck pain and neck stiffness.  Skin: Positive for color change. Negative for pallor, rash and wound.  Hematological: Does not  bruise/bleed easily.       Objective:   Physical Exam  Constitutional: He appears well-developed and well-nourished. No distress.  Musculoskeletal: He exhibits edema and tenderness.       Feet:  L great toe- redness, warmth, 1+ edema Toe nail intact Normal ROM Strong pedal pulses  Skin: Skin is warm and dry. Capillary refill takes less than 2 seconds. No rash noted. He is not diaphoretic. There is erythema. No pallor.  Psychiatric: He has a normal mood and affect. His behavior is normal. Judgment and thought content normal.      Assessment & Plan:   1. Great toe pain, left   2. 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  Uric Acid Level drawn Gout care instructions/diet information provided Please take Colchicine 0.6mg  as directed. Use OTC Ibuprofen 800mg  every 8 hours with food as needed for pain/swelling. Rest as often as possible. Work Excuse provided, okay to return Thursday 03/10/18. Please call clinic with any questions/concerns.  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    FOLLOW-UP:  Return if symptoms worsen or fail to improve.

## 2018-03-08 NOTE — Assessment & Plan Note (Addendum)
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 Uric Acid Level drawn Gout care instructions/diet information provided Please take Colchicine 0.6mg  as directed. Use OTC Ibuprofen 800mg  every 8 hours with food as needed for pain/swelling. Rest as often as possible. Work Excuse provided, okay to return Thursday 03/10/18. Please call clinic with any questions/concerns.  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

## 2018-03-08 NOTE — Patient Instructions (Addendum)
Gout Gout is painful swelling that can occur in some of your joints. Gout is a type of arthritis. This condition is caused by having too much uric acid in your body. Uric acid is a chemical that forms when your body breaks down substances called purines. Purines are important for building body proteins. When your body has too much uric acid, sharp crystals can form and build up inside your joints. This causes pain and swelling. Gout attacks can happen quickly and be very painful (acute gout). Over time, the attacks can affect more joints and become more frequent (chronic gout). Gout can also cause uric acid to build up under your skin and inside your kidneys. What are the causes? This condition is caused by too much uric acid in your blood. This can occur because:  Your kidneys do not remove enough uric acid from your blood. This is the most common cause.  Your body makes too much uric acid. This can occur with some cancers and cancer treatments. It can also occur if your body is breaking down too many red blood cells (hemolytic anemia).  You eat too many foods that are high in purines. These foods include organ meats and some seafood. Alcohol, especially beer, is also high in purines.  A gout attack may be triggered by trauma or stress. What increases the risk? This condition is more likely to develop in people who:  Have a family history of gout.  Are male and middle-aged.  Are male and have gone through menopause.  Are obese.  Frequently drink alcohol, especially beer.  Are dehydrated.  Lose weight too quickly.  Have an organ transplant.  Have lead poisoning.  Take certain medicines, including aspirin, cyclosporine, diuretics, levodopa, and niacin.  Have kidney disease or psoriasis.  What are the signs or symptoms? An attack of acute gout happens quickly. It usually occurs in just one joint. The most common place is the big toe. Attacks often start at night. Other joints  that may be affected include joints of the feet, ankle, knee, fingers, wrist, or elbow. Symptoms may include:  Severe pain.  Warmth.  Swelling.  Stiffness.  Tenderness. The affected joint may be very painful to touch.  Shiny, red, or purple skin.  Chills and fever.  Chronic gout may cause symptoms more frequently. More joints may be involved. You may also have white or yellow lumps (tophi) on your hands or feet or in other areas near your joints. How is this diagnosed? This condition is diagnosed based on your symptoms, medical history, and physical exam. You may have tests, such as:  Blood tests to measure uric acid levels.  Removal of joint fluid with a needle (aspiration) to look for uric acid crystals.  X-rays to look for joint damage.  How is this treated? Treatment for this condition has two phases: treating an acute attack and preventing future attacks. Acute gout treatment may include medicines to reduce pain and swelling, including:  NSAIDs.  Steroids. These are strong anti-inflammatory medicines that can be taken by mouth (orally) or injected into a joint.  Colchicine. This medicine relieves pain and swelling when it is taken soon after an attack. It can be given orally or through an IV tube.  Preventive treatment may include:  Daily use of smaller doses of NSAIDs or colchicine.  Use of a medicine that reduces uric acid levels in your blood.  Changes to your diet. You may need to see a specialist about healthy eating (dietitian).  Follow these instructions at home: During a Gout Attack  If directed, apply ice to the affected area: ? Put ice in a plastic bag. ? Place a towel between your skin and the bag. ? Leave the ice on for 20 minutes, 2-3 times a day.  Rest the joint as much as possible. If the affected joint is in your leg, you may be given crutches to use.  Raise (elevate) the affected joint above the level of your heart as often as  possible.  Drink enough fluids to keep your urine clear or pale yellow.  Take over-the-counter and prescription medicines only as told by your health care provider.  Do not drive or operate heavy machinery while taking prescription pain medicine.  Follow instructions from your health care provider about eating or drinking restrictions.  Return to your normal activities as told by your health care provider. Ask your health care provider what activities are safe for you. Avoiding Future Gout Attacks  Follow a low-purine diet as told by your dietitian or health care provider. Avoid foods and drinks that are high in purines, including liver, kidney, anchovies, asparagus, herring, mushrooms, mussels, and beer.  Limit alcohol intake to no more than 1 drink a day for nonpregnant women and 2 drinks a day for men. One drink equals 12 oz of beer, 5 oz of wine, or 1 oz of hard liquor.  Maintain a healthy weight or lose weight if you are overweight. If you want to lose weight, talk with your health care provider. It is important that you do not lose weight too quickly.  Start or maintain an exercise program as told by your health care provider.  Drink enough fluids to keep your urine clear or pale yellow.  Take over-the-counter and prescription medicines only as told by your health care provider.  Keep all follow-up visits as told by your health care provider. This is important. Contact a health care provider if:  You have another gout attack.  You continue to have symptoms of a gout attack after10 days of treatment.  You have side effects from your medicines.  You have chills or a fever.  You have burning pain when you urinate.  You have pain in your lower back or belly. Get help right away if:  You have severe or uncontrolled pain.  You cannot urinate. This information is not intended to replace advice given to you by your health care provider. Make sure you discuss any questions  you have with your health care provider. Document Released: 10/02/2000 Document Revised: 03/12/2016 Document Reviewed: 07/18/2015 Elsevier Interactive Patient Education  2018 [place] are compounds that affect the level of uric acid in your body. A low-purine diet is a diet that is low in purines. Eating a low-purine diet can prevent the level of uric acid in your body from getting too high and causing gout or kidney stones or both. What do I need to know about this diet?  Choose low-purine foods. Examples of low-purine foods are listed in the next section.  Drink plenty of fluids, especially water. Fluids can help remove uric acid from your body. Try to drink 8-16 cups (1.9-3.8 L) a day.  Limit foods high in fat, especially saturated fat, as fat makes it harder for the body to get rid of uric acid. Foods high in saturated fat include pizza, cheese, ice cream, whole milk, fried foods, and gravies. Choose foods that are lower in fat and lean sources  of protein. Use olive oil when cooking as it contains healthy fats that are not high in saturated fat.  Limit alcohol. Alcohol interferes with the elimination of uric acid from your body. If you are having a gout attack, avoid all alcohol.  Keep in mind that different people's bodies react differently to different foods. You will probably learn over time which foods do or do not affect you. If you discover that a food tends to cause your gout to flare up, avoid eating that food. You can more freely enjoy foods that do not cause problems. If you have any questions about a food item, talk to your dietitian or health care provider. Which foods are low, moderate, and high in purines? The following is a list of foods that are low, moderate, and high in purines. You can eat any amount of the foods that are low in purines. You may be able to have small amounts of foods that are moderate in purines. Ask your health care provider how  much of a food moderate in purines you can have. Avoid foods high in purines. Grains  Foods low in purines: Enriched white bread, pasta, rice, cake, cornbread, popcorn.  Foods moderate in purines: Whole-grain breads and cereals, wheat germ, bran, oatmeal. Uncooked oatmeal. Dry wheat bran or wheat germ.  Foods high in purines: Pancakes, [place] toast, biscuits, muffins. Vegetables  Foods low in purines: All vegetables, except those that are moderate in purines.  Foods moderate in purines: Asparagus, cauliflower, spinach, mushrooms, green peas. Fruits  All fruits are low in purines. Meats and other Protein Foods  Foods low in purines: Eggs, nuts, peanut butter.  Foods moderate in purines: 80-90% lean beef, lamb, veal, pork, poultry, fish, eggs, peanut butter, nuts. Crab, lobster, oysters, and shrimp. Cooked dried beans, peas, and lentils.  Foods high in purines: Anchovies, sardines, herring, mussels, tuna, codfish, scallops, trout, and haddock. Berniece Salines. Organ meats (such as liver or kidney). Tripe. Game meat. Goose. Sweetbreads. Dairy  All dairy foods are low in purines. Low-fat and fat-free dairy products are best because they are low in saturated fat. Beverages  Drinks low in purines: Water, carbonated beverages, tea, coffee, cocoa.  Drinks moderate in purines: Soft drinks and other drinks sweetened with high-fructose corn syrup. Juices. To find whether a food or drink is sweetened with high-fructose corn syrup, look at the ingredients list.  Drinks high in purines: Alcoholic beverages (such as beer). Condiments  Foods low in purines: Salt, herbs, olives, pickles, relishes, vinegar.  Foods moderate in purines: Butter, margarine, oils, mayonnaise. Fats and Oils  Foods low in purines: All types, except gravies and sauces made with meat.  Foods high in purines: Gravies and sauces made with meat. Other Foods  Foods low in purines: Sugars, sweets, gelatin. Cake. Soups made  without meat.  Foods moderate in purines: Meat-based or fish-based soups, broths, or bouillons. Foods and drinks sweetened with high-fructose corn syrup.  Foods high in purines: High-fat desserts (such as ice cream, cookies, cakes, pies, doughnuts, and chocolate). Contact your dietitian for more information on foods that are not listed here. This information is not intended to replace advice given to you by your health care provider. Make sure you discuss any questions you have with your health care provider. Document Released: 01/30/2011 Document Revised: 03/12/2016 Document Reviewed: 09/11/2013 Elsevier Interactive Patient Education  2017 [employer].   We will call you when lab results are available. Please follow care plans as listed above. Please follow "Gout  Diet" Please take Colchicine 0.6mg  as directed. Use OTC Ibuprofen 800mg  every 8 hours with food as needed for pain/swelling. Rest as often as possible. Work Excuse provided, okay to return Thursday 03/10/18. Please call clinic with any questions/concerns. FEEL BETTER!

## 2018-03-09 LAB — URIC ACID: Uric Acid: 7.1 mg/dL (ref 3.7–8.6)

## 2018-03-09 NOTE — Assessment & Plan Note (Signed)
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

## 2018-03-10 ENCOUNTER — Telehealth: Payer: Self-pay | Admitting: Adult Health

## 2018-03-10 ENCOUNTER — Other Ambulatory Visit: Payer: Self-pay | Admitting: Adult Health

## 2018-03-10 MED ORDER — INDOMETHACIN 25 MG PO CAPS: ORAL_CAPSULE | ORAL | 0 refills | Status: DC

## 2018-03-10 MED ORDER — PREDNISONE 20 MG PO TABS: ORAL_TABLET | ORAL | 0 refills | Status: DC

## 2018-03-10 NOTE — Telephone Encounter (Signed)
Pt informed.  Pt expressed understanding and is agreeable.  T. Nelson, CMA  

## 2018-03-10 NOTE — Telephone Encounter (Signed)
Pt states that medication is not helping at all and that he can barely walk.  Please advise.  Charyl Bigger, CMA

## 2018-03-10 NOTE — Progress Notes (Signed)
x

## 2018-03-10 NOTE — Telephone Encounter (Signed)
Patient was seen a few days ago and med that was prescribed patient states is not helping and wants to speak with nurse about what else can be done

## 2018-03-10 NOTE — Telephone Encounter (Signed)
Good Afternoon Tonya, Can you please call Mr. Milliron and share- I sent in Prednisone taper and Indomethacin rx. Please tell him to take as directed. Follow low Purine Diet. Please have him call with any questions/concerns. Thanks! Valetta Fuller

## 2018-03-15 ENCOUNTER — Ambulatory Visit (INDEPENDENT_AMBULATORY_CARE_PROVIDER_SITE_OTHER): Payer: BC Managed Care – PPO | Admitting: Psychiatry

## 2018-03-15 ENCOUNTER — Encounter (HOSPITAL_COMMUNITY): Payer: Self-pay | Admitting: Psychiatry

## 2018-03-15 VITALS — BP 158/82 | HR 84 | Ht 76.0 in | Wt 340.0 lb

## 2018-03-15 DIAGNOSIS — G4730 Sleep apnea, unspecified: Secondary | ICD-10-CM | POA: Diagnosis not present

## 2018-03-15 DIAGNOSIS — F332 Major depressive disorder, recurrent severe without psychotic features: Secondary | ICD-10-CM

## 2018-03-15 DIAGNOSIS — G4733 Obstructive sleep apnea (adult) (pediatric): Secondary | ICD-10-CM | POA: Diagnosis not present

## 2018-03-15 DIAGNOSIS — G4710 Hypersomnia, unspecified: Secondary | ICD-10-CM

## 2018-03-15 NOTE — Progress Notes (Signed)
BH MD/PA/NP OP Progress Note  03/15/2018 3:22 PM Eduardo [name]  MRN:  [id number]  Chief Complaint: nothing has changed, my food hurts HPI: [name] continues with complaints of fatigue, low energy, difficulty focusing, difficulty staying awake.  Provigil has not added any value.  I spent time with him reiterating the importance of addressing sleep apnea.  He seems unconvinced that this would make a difference.  He was receptive to a pulmonary consultation.  I suggested he work on addressing sleep apnea with the pulmonary specialists, and is welcome to return to clinic to consider medication interventions after that.  Visit Diagnosis:    ICD-10-CM   1. Severe episode of recurrent major depressive disorder, without psychotic features ([place]) F33.2   2. Severe obstructive sleep apnea G47.33 Ambulatory referral to Pulmonology  3. 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 Reviewed, with no updates at this time.   Past Medical History:  Past Medical History:  Diagnosis Date  . Anxiety   . Chronic back pain   . Degenerative disk disease   . Depression   . DVT of upper extremity (deep vein thrombosis) ([place])   . GERD (gastroesophageal reflux disease)   . Hypertension   . MRSA (methicillin resistant staph aureus) culture positive   . Peripheral neuropathy   . Pilonidal cyst   . Pulmonary embolus ([place])    no blood thinner 2002  . Sleep apnea    can't use cpap due to deviated nasal septumg    Past Surgical History:  Procedure Laterality Date  . Irrigation and debridement of back abscess    . NASAL SEPTOPLASTY W/ TURBINOPLASTY [name] 02/03/2018   Procedure: NASAL SEPTOPLASTY WITH TURBINATE REDUCTION;  Surgeon: [name], MD;  Location: [facility];  Service: ENT;  Laterality: [name];  . PILONIDAL CYST DRAINAGE [name] 04/10/2017   Procedure: IRRIGATION AND DEBRIDEMENT BACK ABSCESS;   Surgeon: [name], MD;  Location: [facility];  Service: General;  Laterality: [name];  . SINUS ENDO WITH FUSION [name] 02/03/2018   Procedure: ENDOSCOPIC SINUS SURGERY WITH FUSION;  Surgeon: [name], MD;  Location: [facility];  Service: ENT;  Laterality: [name];  . THORACIC OUTLET SURGERY  2000  . WRIST FUSION  2002    Family Psychiatric History: See intake H&P for full details. Reviewed, with no updates at this time.   Family History:  Family History  Problem Relation Age of Onset  . Cancer Maternal Grandmother        breast, ovarian & colon  . Hypertension Mother   . Diabetes Mother   . Hypertension Father   . Heart disease Father   . Stroke Paternal Grandfather   . Neuropathy Neg Hx     Social History:  Social History   Socioeconomic History  . Marital status: Divorced    Spouse name: Not on file  . Number of children: 2  . Years of education: 12th   . Highest education level: Not on file  Occupational History  . Occupation: [name]  Social Needs  . Financial resource strain: Not on file  . Food insecurity:    Worry: Not on file    Inability: Not on file  . Transportation needs:    Medical: Not on file    Non-medical: Not on file  Tobacco Use  . Smoking status: Current Every Day Smoker    Packs/day: 1.00  Years: 33.00    Pack years: 33.00    Types: Cigarettes  . Smokeless tobacco: Never Used  Substance and Sexual Activity  . Alcohol use: No    Alcohol/week: 0.0 oz    Comment: Rarely  . Drug use: No  . Sexual activity: Yes    Birth control/protection: None  Lifestyle  . Physical activity:    Days per week: Not on file    Minutes per session: Not on file  . Stress: Not on file  Relationships  . Social connections:    Talks on phone: Not on file    Gets together: Not on file    Attends religious service: Not on file    Active member of club or organization: Not on file    Attends meetings of clubs or organizations: Not on file    Relationship status: Not on  file  Other Topics Concern  . Not on file  Social History Narrative   Lives at home w/ his step-mother   Right-handed   Drinks about 2-3 Mt Dews a day    Allergies:  Allergies  Allergen Reactions  . Pollen Extract Cough  . Dust Mite Extract   . Other     Ragweed    Metabolic Disorder Labs: Lab Results  Component Value Date   HGBA1C 4.6 (L) 11/11/2017   MPG 91 10/25/2014   No results found for: PROLACTIN Lab Results  Component Value Date   CHOL 113 11/11/2017   TRIG 176 (H) 11/11/2017   HDL 25 (L) 11/11/2017   CHOLHDL 4.5 11/11/2017   VLDL 36 10/25/2014   LDLCALC 53 11/11/2017   LDLCALC 42 10/25/2014   Lab Results  Component Value Date   TSH 0.836 11/11/2017   TSH 0.889 05/09/2015    Therapeutic Level Labs: No results found for: LITHIUM No results found for: VALPROATE No components found for:  CBMZ  Current Medications: Current Outpatient Medications  Medication Sig Dispense Refill  . amLODipine (NORVASC) 2.5 MG tablet Take 1 tablet (2.5 mg total) by mouth daily. 90 tablet 2  . colchicine 0.6 MG tablet Take two tabs initially at onset of flare.  May repeat in one hr if needed.  Max daily dose 1.8mg /day. 6 tablet 0  . fluticasone (FLONASE) 50 MCG/ACT nasal spray Place 2 sprays into both nostrils daily. 16 g 6  . hydrochlorothiazide (HYDRODIURIL) 25 MG tablet Take 1 tablet (25 mg total) by mouth daily. 90 tablet 3  . indomethacin (INDOCIN) 25 MG capsule 2 caps every 8 hrs as needed for Gout flare.  Discontinue 2-3 days after resolution of symptoms. 30 capsule 0  . LYRICA 300 MG capsule TAKE 1 CAPSULE BY MOUTH TWICE A DAY 60 capsule 2  . modafinil (PROVIGIL) 100 MG tablet Take 1 tablet (100 mg total) by mouth daily. 30 tablet 2  . montelukast (SINGULAIR) 10 MG tablet Take 1 tablet (10 mg total) by mouth at bedtime. 90 tablet 3  . Nutritional Supplements (JUICE PLUS FIBRE PO) Take 4 each by mouth daily with lunch.    . predniSONE (DELTASONE) 20 MG tablet 1 tab  every 12 hrs for 3 days, then daily for 3 days 11 tablet 0  . traMADol (ULTRAM) 50 MG tablet Take 1 tablet (50 mg total) by mouth 2 (two) times daily. 60 tablet 5   No current facility-administered medications for this visit.      Musculoskeletal: Strength & Muscle Tone: within normal limits Gait & Station: normal Patient leans: [name]  Psychiatric Specialty Exam: ROS  Blood pressure (!) 158/82, pulse 84, height 6\' 4"  (1.93 m), weight (!) 340 lb (154.2 kg).Body mass index is 41.39 kg/m.  General Appearance: Casual and Fairly Groomed  Eye Contact:  Minimal  Speech:  Clear and Coherent and Normal Rate  Volume:  Normal  Mood:  Depressed  Affect:  Flat  Thought Process:  Goal Directed and Descriptions of Associations: Intact  Orientation:  Full (Time, Place, and Person)  Thought Content: Logical   Suicidal Thoughts:  No  Homicidal Thoughts:  No  Memory:  Immediate;   Fair  Judgement:  Intact  Insight:  Shallow  Psychomotor Activity:  Normal  Concentration:  Concentration: Fair  Recall:  [place]rown of Knowledge: Fair  Language: Fair  Akathisia:  Negative  Handed:  Right  AIMS (if indicated): not done  Assets:  Housing Vocational/Educational  ADL's:  Intact  Cognition: WNL  Sleep:  Poor   Screenings: AUDIT     Admission (Discharged) from 12/23/2012 in [facility]  Alcohol Use Disorder Identification Test Final Score (AUDIT)  0    GAD-7     Office Visit from 05/20/2017 in [place] at [facility] Visit from 04/01/2017 in [place] at [facility]  Total GAD-7 Score  20  15    PHQ2-9     Office Visit from 03/08/2018 in [place] at [facility] Visit from 02/09/2018 in [place] at [facility] Visit from 11/11/2017 in [place] at [facility] Visit from 10/14/2017 in [place] at [facility] Visit from 08/11/2017 in [place] at [facility] Total Score  2  2  2  2  4   PHQ-9 Total Score  12  13  11  13  20        Assessment and Plan:  GARV KUECHLE is a 47 year old male with severe sleep apnea with an apnea hypoxia index of 83.  He was unable to tolerate the CPAP machine, and terminated the split-night study.  I have attempted multiple interventions with the patient to address his excessive daytime sleepiness, and mood symptoms, but frankly psychiatric medications cannot make a meaningful intervention and adequately diffuse through a blood-brain barrier when there is this level of increased vascular tone, and oxygen starvation in the brain.  I educated patient about this multiple times, and have instructed him to seek expert consultation with pulmonary to address his sleep apnea symptoms.  Once his sleep apnea symptoms are dressed, I am happy to work with the patient on addressing any additional mood or energy issues, no further medications at this time.  1. Severe episode of recurrent major depressive disorder, without psychotic features ([place])   2. Severe obstructive sleep apnea   3. Hypersomnia with sleep apnea     Status of current problems: unchanged  Labs Ordered: Orders Placed This Encounter  Procedures  . Ambulatory referral to Pulmonology    Referral Priority:   Routine    Referral Type:   Consultation    Referral Reason:   Specialty Services Required    Requested Specialty:   Pulmonary Disease    Number of Visits Requested:   1    Labs Reviewed: n/a  Collateral Obtained/Records Reviewed: n/a  Plan:  Provigil discontinued given lack of benefit Refer to pulmonary for intervention of sleep apnea treatment Follow-up after  he has addressed his sleep apnea symptoms with pulmonary or with primary care Educated patient about the significant limitation on psychiatric medication efficacy when individuals continue to suffer with severe sleep apnea  I spent 15 minutes with the  patient in direct face-to-face clinical care.  Greater than 50% of this time was spent in counseling and coordination of care with the patient.    Aundra Dubin, MD 03/15/2018, 3:22 PM

## 2018-03-17 ENCOUNTER — Telehealth: Payer: Self-pay

## 2018-03-17 DIAGNOSIS — M25572 Pain in left ankle and joints of left foot: Secondary | ICD-10-CM

## 2018-03-17 NOTE — Telephone Encounter (Signed)
Pt informed.  Referral placed to ortho.  Charyl Bigger, CMA

## 2018-03-17 NOTE — Telephone Encounter (Signed)
Unfortunately Eduardo [name] is not here so we cannot ask her about her differential diagnosis and what further management she recommends, thus, due to worsening pain despite the treatment regimen, yes, we can send him to sports medicine for further evaluation.  I think Eduardo [name] likes Dr. Tamala Julian up in [place]. Please place referral for her.  Thank you

## 2018-03-17 NOTE — Telephone Encounter (Signed)
Pt called stating that his foot pain is worsening and he is barely able to walk.  Pt states that the pain occurs on the top of his left foot, starting at the great toe, across the top of his foot and into his ankle.  Pt states that he finished the prednisone dose pack and has been taking the indomethacin.  He also states that he has not been drinking alcohol nor eating red meat or fried foods.  Should we refer pt to ortho?  Please advise.  Charyl Bigger, CMA

## 2018-03-22 ENCOUNTER — Encounter (INDEPENDENT_AMBULATORY_CARE_PROVIDER_SITE_OTHER): Payer: Self-pay | Admitting: Orthopaedic Surgery

## 2018-03-22 ENCOUNTER — Ambulatory Visit (INDEPENDENT_AMBULATORY_CARE_PROVIDER_SITE_OTHER): Payer: BC Managed Care – PPO | Admitting: Orthopaedic Surgery

## 2018-03-22 DIAGNOSIS — M79672 Pain in left foot: Secondary | ICD-10-CM

## 2018-03-22 MED ORDER — PREDNISONE 10 MG (21) PO TBPK: ORAL_TABLET | ORAL | 0 refills | Status: DC

## 2018-03-22 MED ORDER — MELOXICAM 7.5 MG PO TABS: 15.0000 mg | ORAL_TABLET | Freq: Every day | ORAL | 2 refills | Status: DC | PRN

## 2018-03-22 NOTE — Progress Notes (Signed)
Office Visit Note   Patient: Eduardo [name]           Date of Birth: Aug 08, 1971           MRN: [id number] Visit Date: 03/22/2018              Requested by: [name], NP [place], [place] [address] PCP: [name], NP   Assessment & Plan: Visit Diagnoses:  1. Left foot pain     Plan: Impression is left foot pain possible gout versus midfoot arthritis versus stress fracture.  I recommend protected weightbearing with Cam walker.  Prescription for prednisone taper and meloxicam.  Follow-up in 4 weeks for recheck.  Follow-Up Instructions: Return in about 1 month (around 04/19/2018).   Orders:  No orders of the defined types were placed in this encounter.  Meds ordered this encounter  Medications  . predniSONE (STERAPRED UNI-PAK 21 TAB) 10 MG (21) TBPK tablet    Sig: Take as directed    Dispense:  21 tablet    Refill:  0  . meloxicam (MOBIC) 7.5 MG tablet    Sig: Take 2 tablets (15 mg total) by mouth daily as needed for pain. Take after completing prednisone taper.  Take for up to 2 weeks.    Dispense:  30 tablet    Refill:  2      Procedures: No procedures performed   Clinical Data: No additional findings.   Subjective: Chief Complaint  Patient presents with  . Left Foot - Pain    [name] is a 47 year old male who comes in with left foot pain for couple months that is worse with walking and standing.  He denies any pain at rest.  He does have chronic peripheral neuropathy.  He works as a Dealer for [employer].  He is not diabetic.  He recently had a uric acid level that was in the high range of normal.   Review of Systems  Constitutional: Negative.   All other systems reviewed and are negative.    Objective: Vital Signs: There were no vitals taken for this visit.  Physical Exam  Constitutional: He is oriented to person, place, and time. He appears well-developed and well-nourished.  HENT:  Head: Normocephalic and  atraumatic.  Eyes: Pupils are equal, round, and reactive to light.  Neck: Neck supple.  Pulmonary/Chest: Effort normal.  Abdominal: Soft.  Musculoskeletal: Normal range of motion.  Neurological: He is alert and oriented to person, place, and time.  Skin: Skin is warm.  Psychiatric: He has a normal mood and affect. His behavior is normal. Judgment and thought content normal.  Nursing note and vitals reviewed.   Ortho Exam Left foot exam shows no significant swelling or warmth or redness.  No neurovascular compromise.  Painless motion of the MTP joints. Specialty Comments:  No specialty comments available.  Imaging: No results found.   PMFS History: Patient Active Problem List   Diagnosis Date Noted  . Great toe pain, left 03/08/2018  . Acute gout involving toe of left foot 03/08/2018  . Low libido 02/09/2018  . Sleep apnea 02/03/2018  . Status post nasal septoplasty 02/03/2018  . Deviated septum 11/11/2017  . Umbilical hernia without obstruction and without gangrene 11/11/2017  . Scrotal pain 11/11/2017  . Scrotal cyst 11/11/2017  . Screening, lipid 11/11/2017  . Screening for diabetes mellitus (DM) 11/11/2017  . Screening for thyroid disorder 11/11/2017  . Anxiety and depression 08/11/2017  . Acute  maxillary sinusitis 08/11/2017  . 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 Lumbar degenerative disc disease 08/02/2017  . Acute left ankle pain 07/12/2017  . Skin infection 06/03/2017  . Chronic folliculitis 26/94/8546  . [place] 04/10/2017  . Cellulitis and abscess of trunk s/p I&D 04/10/2017 04/06/2017  . Fever chills 04/06/2017  . History of MRSA infection 04/06/2017  . Physical exam, annual 03/03/2017  . Neuropathy 09/28/2015  . Major depression 12/26/2012  . Generalized anxiety disorder 12/26/2012  . Opiate dependence ([place]) 12/24/2012  . Benzodiazepine dependence ([place]) 12/24/2012  . HTN (hypertension) 12/24/2012  . Chronic pilonidal disease  11/22/2012  . OBESITY 09/09/2010  . TOBACCO ABUSE 09/09/2010  . LOW BACK PAIN, CHRONIC 09/09/2010   Past Medical History:  Diagnosis Date  . Anxiety   . Chronic back pain   . Degenerative disk disease   . Depression   . DVT of upper extremity (deep vein thrombosis) ([place])   . GERD (gastroesophageal reflux disease)   . Hypertension   . MRSA (methicillin resistant staph aureus) culture positive   . Peripheral neuropathy   . Pilonidal cyst   . Pulmonary embolus ([place])    no blood thinner 2002  . Sleep apnea    can't use cpap due to deviated nasal septumg    Family History  Problem Relation Age of Onset  . Cancer Maternal Grandmother        breast, ovarian & colon  . Hypertension Mother   . Diabetes Mother   . Hypertension Father   . Heart disease Father   . Stroke Paternal Grandfather   . Neuropathy Neg Hx     Past Surgical History:  Procedure Laterality Date  . Irrigation and debridement of back abscess    . NASAL SEPTOPLASTY W/ TURBINOPLASTY [name] 02/03/2018   Procedure: NASAL SEPTOPLASTY WITH TURBINATE REDUCTION;  Surgeon: [name], MD;  Location: [facility];  Service: ENT;  Laterality: [name];  . PILONIDAL CYST DRAINAGE [name] 04/10/2017   Procedure: IRRIGATION AND DEBRIDEMENT BACK ABSCESS;  Surgeon: [name], MD;  Location: [facility];  Service: General;  Laterality: [name];  . SINUS ENDO WITH FUSION [name] 02/03/2018   Procedure: ENDOSCOPIC SINUS SURGERY WITH FUSION;  Surgeon: [name], MD;  Location: [facility];  Service: ENT;  Laterality: [name];  . THORACIC OUTLET SURGERY  2000  . WRIST FUSION  2002   Social History   Occupational History  . Occupation: [name]  Tobacco Use  . Smoking status: Current Every Day Smoker    Packs/day: 1.00    Years: 33.00    Pack years: 33.00    Types: Cigarettes  . Smokeless tobacco: Never Used  Substance and Sexual Activity  . Alcohol use: No    Alcohol/week: 0.0 oz    Comment: Rarely  . Drug use: No  . Sexual activity: Yes    Birth  control/protection: None

## 2018-03-29 ENCOUNTER — Telehealth: Payer: Self-pay

## 2018-03-29 NOTE — Telephone Encounter (Signed)
Pt called stating he hurt his ankle and has taken more Tramadol. Next appt is in September. Can he come in sooner?

## 2018-03-30 NOTE — Telephone Encounter (Signed)
Cannot increase tramadol due to high dose nortriptyline.  We can call in Meloxicam.  Otherwise will need appt with myself or with [name]

## 2018-03-30 NOTE — Telephone Encounter (Signed)
He got meloxicam from Dr Erlinda Hong.  We will schedule with [name].

## 2018-03-30 NOTE — Telephone Encounter (Signed)
Last fill of Tramadol was 03/18/18 #60 and he saw ortho Dr Erlinda Hong 03/24/18 and received meloxicam and prednisone for his injury.  Does he need to make an appt or can he have more tramadol?  His last OV was 12/17/17 and was given the rx for tramadol #60 with 5 refills.

## 2018-03-30 NOTE — Telephone Encounter (Signed)
Left patient a message to set up an appointment with either [facility] or Dr. Letta Pate on Monday for medication.

## 2018-04-04 ENCOUNTER — Encounter: Payer: BC Managed Care – PPO | Attending: Physical Medicine & Rehabilitation

## 2018-04-04 ENCOUNTER — Ambulatory Visit (HOSPITAL_BASED_OUTPATIENT_CLINIC_OR_DEPARTMENT_OTHER): Payer: BC Managed Care – PPO | Admitting: Physical Medicine & Rehabilitation

## 2018-04-04 ENCOUNTER — Encounter: Payer: Self-pay | Admitting: Physical Medicine & Rehabilitation

## 2018-04-04 VITALS — BP 115/78 | HR 79 | Ht 76.0 in | Wt 352.0 lb

## 2018-04-04 DIAGNOSIS — M79671 Pain in right foot: Secondary | ICD-10-CM | POA: Insufficient documentation

## 2018-04-04 DIAGNOSIS — G894 Chronic pain syndrome: Secondary | ICD-10-CM | POA: Insufficient documentation

## 2018-04-04 DIAGNOSIS — M21612 Bunion of left foot: Secondary | ICD-10-CM | POA: Insufficient documentation

## 2018-04-04 DIAGNOSIS — M21611 Bunion of right foot: Secondary | ICD-10-CM | POA: Insufficient documentation

## 2018-04-04 DIAGNOSIS — G609 Hereditary and idiopathic neuropathy, unspecified: Secondary | ICD-10-CM | POA: Diagnosis not present

## 2018-04-04 DIAGNOSIS — M76822 Posterior tibial tendinitis, left leg: Secondary | ICD-10-CM

## 2018-04-04 DIAGNOSIS — G608 Other hereditary and idiopathic neuropathies: Secondary | ICD-10-CM | POA: Insufficient documentation

## 2018-04-04 DIAGNOSIS — M79672 Pain in left foot: Secondary | ICD-10-CM | POA: Insufficient documentation

## 2018-04-04 MED ORDER — TRAMADOL HCL 50 MG PO TABS: 50.0000 mg | ORAL_TABLET | Freq: Four times a day (QID) | ORAL | 1 refills | Status: DC | PRN

## 2018-04-04 NOTE — Progress Notes (Signed)
Subjective:    Patient ID: Eduardo [name], male    DOB: 04/19/1971, 47 y.o.   MRN: [id number]  HPI  47 year old male with chronic bilateral foot pain secondary to axonal polyneuropathy of undetermined etiology.  He is no longer taking nortriptyline. Left foot and ankle pain for the last 2 months seen by Ortho.  History of elevated uric acid.  No trauma.  Works as a Dealer at [employer] differential diagnosis was gout versus midfoot arthritis versus stress fracture.  X-rays reportedly showed no acute fracture. Takes Medrol dose pack which will be followed by meloxicam as per Ortho. Treated for obstructive sleep apnea related to his obesity as well as history of depression Pain Inventory Average Pain 8 Pain Right Now 8 My pain is constant, sharp, burning, stabbing and tingling  In the last 24 hours, has pain interfered with the following? General activity 9 Relation with others 7 Enjoyment of life 10 What TIME of day is your pain at its worst? evening Sleep (in general) Poor  Pain is worse with: walking and standing Pain improves with: rest Relief from Meds: na  Mobility walk without assistance do you drive?  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  no  Physicians involved in your care Any changes since last visit?  no   Family History  Problem Relation Age of Onset  . Cancer Maternal Grandmother        breast, ovarian & colon  . Hypertension Mother   . Diabetes Mother   . Hypertension Father   . Heart disease Father   . Stroke Paternal Grandfather   . Neuropathy Neg Hx    Social History   Socioeconomic History  . Marital status: Divorced    Spouse name: Not on file  . Number of children: 2  . Years of education: 12th   .  Highest education level: Not on file  Occupational History  . Occupation: [name]  Social Needs  . Financial resource strain: Not on file  . Food insecurity:    Worry: Not on file    Inability: Not on file  . Transportation needs:    Medical: Not on file    Non-medical: Not on file  Tobacco Use  . Smoking status: Current Every Day Smoker    Packs/day: 1.00    Years: 33.00    Pack years: 33.00    Types: Cigarettes  . Smokeless tobacco: Never Used  Substance and Sexual Activity  . Alcohol use: No    Alcohol/week: 0.0 oz    Comment: Rarely  . Drug use: No  . Sexual activity: Yes    Birth control/protection: None  Lifestyle  . Physical activity:    Days per week: Not on file    Minutes per session: Not on file  . Stress: Not on file  Relationships  . Social connections:    Talks on phone: Not on file    Gets together: Not on file    Attends religious service: Not on file    Active member of club or organization: Not on file    Attends meetings of clubs or organizations: Not on file    Relationship status: Not on file  Other Topics Concern  . Not on file  Social History Narrative   Lives at home w/ his  step-mother   Right-handed   Drinks about 2-3 Mt Dews a day   Past Surgical History:  Procedure Laterality Date  . Irrigation and debridement of back abscess    . NASAL SEPTOPLASTY W/ TURBINOPLASTY [name] 02/03/2018   Procedure: NASAL SEPTOPLASTY WITH TURBINATE REDUCTION;  Surgeon: [name], MD;  Location: [facility];  Service: ENT;  Laterality: [name];  . PILONIDAL CYST DRAINAGE [name] 04/10/2017   Procedure: IRRIGATION AND DEBRIDEMENT BACK ABSCESS;  Surgeon: [name], MD;  [facility];  Service: General;  Laterality: [name];  . SINUS ENDO WITH FUSION [name] 02/03/2018   Procedure: ENDOSCOPIC SINUS SURGERY WITH FUSION;  Surgeon: [name], MD;  Location: [facility];  Service: ENT;  Laterality: [name];  . THORACIC OUTLET SURGERY  2000  . WRIST FUSION  2002   Past Medical History:    Diagnosis Date  . Anxiety   . Chronic back pain   . Degenerative disk disease   . Depression   . DVT of upper extremity (deep vein thrombosis) ([place])   . GERD (gastroesophageal reflux disease)   . Hypertension   . MRSA (methicillin resistant staph aureus) culture positive   . Peripheral neuropathy   . Pilonidal cyst   . Pulmonary embolus ([place])    no blood thinner 2002  . Sleep apnea    can't use cpap due to deviated nasal septumg   There were no vitals taken for this visit.  Opioid Risk Score:   Fall Risk Score:  `1  Depression screen PHQ 2/9  Depression screen [facility] 2/9 03/08/2018 02/09/2018 11/11/2017 10/14/2017 08/11/2017 08/02/2017 07/12/2017  Decreased Interest _0 Down, Depressed, Hopeless 1 0 _1 PHQ - 2 Score _2 Altered sleeping _3 Tired, decreased energy _4 Change in appetite _5 Feeling bad or failure about yourself  0 0 _6 Trouble concentrating _7 Moving slowly or fidgety/restless _8 Suicidal thoughts 0 0 0 0 0 1 1  PHQ-9 Score _9 Difficult doing work/chores Very difficult Somewhat difficult Somewhat difficult Very difficult Very difficult Extremely dIfficult Somewhat difficult  Some recent data might be hidden     Review of Systems  Constitutional: Positive for diaphoresis and unexpected weight change.  HENT: Negative.   Eyes: Negative.   Respiratory: Positive for apnea and shortness of breath.   Cardiovascular: Negative.   Gastrointestinal: Negative.   Endocrine: Negative.   Genitourinary: Negative.   Musculoskeletal: Positive for arthralgias, gait problem and myalgias.  Skin: Negative.   Allergic/Immunologic: Negative.   Neurological: Positive for dizziness, tremors and numbness.  Hematological: Negative.   Psychiatric/Behavioral: Positive for confusion and dysphoric mood. The patient is nervous/anxious.   All other systems reviewed and  are negative.      Objective:   Physical Exam  Constitutional: He is oriented to person, place, and time. He appears well-developed and well-nourished. No distress.  HENT:  Head: Normocephalic and atraumatic.  Eyes: Pupils are equal, round, and reactive to light. EOM are normal.  Musculoskeletal:       Left ankle: He exhibits normal range of motion. Tenderness. Medial malleolus tenderness found. Achilles tendon normal.       Left  foot: There is tenderness and deformity. There is normal range of motion.  Pes planus- no skin lesions, no erythema , no foot or ankle joint swelling  Tenderness in plantar fascia and base of 1st met head  Neurological: He is alert and oriented to person, place, and time.  Skin: Skin is warm and dry. He is not diaphoretic.  Psychiatric: His affect is blunt. He exhibits a depressed mood.  Nursing note and vitals reviewed.         Assessment & Plan:  1.  Chronic axonal polyneuropathy with chronic neuropathic pain.  He had been on tramadol 50 mg twice daily in conjunction with nortriptyline.  He is no longer taking nortriptyline.  Given his comorbidity of depression may consider trial of duloxetine  2.  Left foot and ankle pain exam today looks like plantar fasciitis plus posterior tibial tendinitis also has history of elevated uric acid but no clinical signs of gout today.  He remains on Medrol Dosepak, he will follow-up with orthopedics. Given his pes planus neuropathy and obesity, may benefit from bilateral foot orthotics.  Will temporarily increase tramadol to 100 mg 4 times daily.  Would avoid SSRIs or tricyclic antidepressants while patient is on a high dose tramadol.  I discussed that patient should not need to be on this higher dose for more than a month or 2.  He will then back down to his usual dose but may need to start duloxetine in conjunction.

## 2018-04-04 NOTE — Patient Instructions (Signed)
Follow up with Dr Erlinda Hong for your foot, you can ask him about orthotics for your feet  You are now at the max dose of tramadol, you will stay on this for no more than 2 months and then  Reduce dose.

## 2018-04-07 ENCOUNTER — Encounter: Payer: Self-pay | Admitting: Adult Health

## 2018-04-07 ENCOUNTER — Ambulatory Visit (INDEPENDENT_AMBULATORY_CARE_PROVIDER_SITE_OTHER): Payer: BC Managed Care – PPO | Admitting: Adult Health

## 2018-04-07 ENCOUNTER — Ambulatory Visit: Payer: BC Managed Care – PPO

## 2018-04-07 VITALS — BP 127/79 | HR 94 | Ht 75.98 in | Wt 357.0 lb

## 2018-04-07 DIAGNOSIS — F17200 Nicotine dependence, unspecified, uncomplicated: Secondary | ICD-10-CM

## 2018-04-07 DIAGNOSIS — R0602 Shortness of breath: Secondary | ICD-10-CM | POA: Insufficient documentation

## 2018-04-07 DIAGNOSIS — Z0000 Encounter for general adult medical examination without abnormal findings: Secondary | ICD-10-CM | POA: Diagnosis not present

## 2018-04-07 DIAGNOSIS — E876 Hypokalemia: Secondary | ICD-10-CM | POA: Insufficient documentation

## 2018-04-07 DIAGNOSIS — I872 Venous insufficiency (chronic) (peripheral): Secondary | ICD-10-CM | POA: Insufficient documentation

## 2018-04-07 DIAGNOSIS — R600 Localized edema: Secondary | ICD-10-CM | POA: Diagnosis not present

## 2018-04-07 MED ORDER — POTASSIUM CHLORIDE CRYS ER 20 MEQ PO TBCR: 20.0000 meq | EXTENDED_RELEASE_TABLET | Freq: Every day | ORAL | 2 refills | Status: DC

## 2018-04-07 MED ORDER — HYDROCHLOROTHIAZIDE 25 MG PO TABS: 25.0000 mg | ORAL_TABLET | Freq: Two times a day (BID) | ORAL | 1 refills | Status: DC

## 2018-04-07 NOTE — Patient Instructions (Addendum)
Mediterranean Diet A Mediterranean diet refers to food and lifestyle choices that are based on the traditions of countries located on the [place] Sea. This way of eating has been shown to help prevent certain conditions and improve outcomes for people who have chronic diseases, like kidney disease and heart disease. What are tips for following this plan? Lifestyle  Cook and eat meals together with your family, when possible.  Drink enough fluid to keep your urine clear or pale yellow.  Be physically active every day. This includes: ? Aerobic exercise like running or swimming. ? Leisure activities like gardening, walking, or housework.  Get 7-8 hours of sleep each night.  If recommended by your health care provider, drink red wine in moderation. This means 1 glass a day for nonpregnant women and 2 glasses a day for men. A glass of wine equals 5 oz (150 mL). Reading food labels  Check the serving size of packaged foods. For foods such as rice and pasta, the serving size refers to the amount of cooked product, not dry.  Check the total fat in packaged foods. Avoid foods that have saturated fat or trans fats.  Check the ingredients list for added sugars, such as corn syrup. Shopping  At the grocery store, buy most of your food from the areas near the walls of the store. This includes: ? Fresh fruits and vegetables (produce). ? Grains, beans, nuts, and seeds. Some of these may be available in unpackaged forms or large amounts (in bulk). ? Fresh seafood. ? Poultry and eggs. ? Low-fat dairy products.  Buy whole ingredients instead of prepackaged foods.  Buy fresh fruits and vegetables in-season from local farmers markets.  Buy frozen fruits and vegetables in resealable bags.  If you do not have access to quality fresh seafood, buy precooked frozen shrimp or canned fish, such as tuna, salmon, or sardines.  Buy small amounts of raw or cooked vegetables, salads, or olives from the  deli or salad bar at your store.  Stock your pantry so you always have certain foods on hand, such as olive oil, canned tuna, canned tomatoes, rice, pasta, and beans. Cooking  Cook foods with extra-virgin olive oil instead of using butter or other vegetable oils.  Have meat as a side dish, and have vegetables or grains as your main dish. This means having meat in small portions or adding small amounts of meat to foods like pasta or stew.  Use beans or vegetables instead of meat in common dishes like chili or lasagna.  Experiment with different cooking methods. Try roasting or broiling vegetables instead of steaming or sauteing them.  Add frozen vegetables to soups, stews, pasta, or rice.  Add nuts or seeds for added healthy fat at each meal. You can add these to yogurt, salads, or vegetable dishes.  Marinate fish or vegetables using olive oil, lemon juice, garlic, and fresh herbs. Meal planning  Plan to eat 1 vegetarian meal one day each week. Try to work up to 2 vegetarian meals, if possible.  Eat seafood 2 or more times a week.  Have healthy snacks readily available, such as: ? Vegetable sticks with hummus. ? Greek yogurt. ? Fruit and nut trail mix.  Eat balanced meals throughout the week. This includes: ? Fruit: 2-3 servings a day ? Vegetables: 4-5 servings a day ? Low-fat dairy: 2 servings a day ? Fish, poultry, or lean meat: 1 serving a day ? Beans and legumes: 2 or more servings a week ? Nuts   and seeds: 1-2 servings a day ? Whole grains: 6-8 servings a day ? Extra-virgin olive oil: 3-4 servings a day  Limit red meat and sweets to only a few servings a month What are my food choices?  Mediterranean diet ? Recommended ? Grains: Whole-grain pasta. Brown rice. Bulgar wheat. Polenta. Couscous. Whole-wheat bread. [place]. ? Vegetables: Artichokes. Beets. Broccoli. Cabbage. Carrots. Eggplant. Green beans. Chard. Kale. Spinach. Onions. Leeks. Peas. Squash.  Tomatoes. Peppers. Radishes. ? Fruits: Apples. Apricots. Avocado. Berries. Bananas. Cherries. Dates. Figs. Grapes. Lemons. Melon. Oranges. Peaches. Plums. Pomegranate. ? Meats and other protein foods: Beans. Almonds. Sunflower seeds. Pine nuts. Peanuts. [place]. Salmon. Scallops. Shrimp. [place]. Tilapia. Clams. Oysters. Eggs. ? Dairy: Low-fat milk. Cheese. Greek yogurt. ? Beverages: Water. Red wine. Herbal tea. ? Fats and oils: Extra virgin olive oil. Avocado oil. Grape seed oil. ? Sweets and desserts: [place] yogurt with honey. Baked apples. Poached pears. Trail mix. ? Seasoning and other foods: Basil. Cilantro. Coriander. Cumin. Mint. Parsley. Sage. Rosemary. Tarragon. Garlic. Oregano. Thyme. Pepper. Balsalmic vinegar. Tahini. Hummus. Tomato sauce. Olives. Mushrooms. ? Limit these ? Grains: Prepackaged pasta or rice dishes. Prepackaged cereal with added sugar. ? Vegetables: Deep fried potatoes (french fries). ? Fruits: Fruit canned in syrup. ? Meats and other protein foods: Beef. Pork. Lamb. Poultry with skin. Hot dogs. [place] Salines. ? Dairy: Ice cream. Sour cream. Whole milk. ? Beverages: Juice. Sugar-sweetened soft drinks. Beer. Liquor and spirits. ? Fats and oils: Butter. Canola oil. Vegetable oil. Beef fat (tallow). Lard. ? Sweets and desserts: Cookies. Cakes. Pies. Candy. ? Seasoning and other foods: Mayonnaise. Premade sauces and marinades. ? The items listed may not be a complete list. Talk with your dietitian about what dietary choices are right for you. Summary  The Mediterranean diet includes both food and lifestyle choices.  Eat a variety of fresh fruits and vegetables, beans, nuts, seeds, and whole grains.  Limit the amount of red meat and sweets that you eat.  Talk with your health care provider about whether it is safe for you to drink red wine in moderation. This means 1 glass a day for nonpregnant women and 2 glasses a day for men. A glass of wine equals 5 oz (150 mL). This information  is not intended to replace advice given to you by your health care provider. Make sure you discuss any questions you have with your health care provider. Document Released: 05/28/2016 Document Revised: 06/30/2016 Document Reviewed: 05/28/2016 Elsevier Interactive Patient Education  2018 [place] of Breath, Adult Shortness of breath means you have trouble breathing. Your lungs are organs for breathing. Follow these instructions at home: Pay attention to any changes in your symptoms. Take these actions to help with your condition:  Do not smoke. Smoking can cause shortness of breath. If you need help to quit smoking, ask your doctor.  Avoid things that can make it harder to breathe, such as: ? Mold. ? Dust. ? Air pollution. ? Chemical smells. ? Things that can cause allergy symptoms (allergens), if you have allergies.  Keep your living space clean and free of mold and dust.      Rest as needed. Slowly return to your usual activities.  Take over-the-counter and prescription medicines, including oxygen and inhaled medicines, only as told by your doctor.  Keep all follow-up visits as told by your doctor. This is important.  Contact a doctor if:  Your condition does not get better as soon as expected.  You have a  hard time doing your normal activities, even after you rest.  You have new symptoms. Get help right away if:  You have trouble breathing when you are resting.  You feel light-headed or you faint.  You have a cough that is not helped by medicines.  You cough up blood.  You have pain with breathing.  You have pain in your chest, arms, shoulders, or belly (abdomen).  You have a fever.  You cannot walk up stairs.  You cannot exercise the way you normally do. This information is not intended to replace advice given to you by your health care provider. Make sure you discuss any questions you have with your health care provider. Document  Released: 03/23/2008 Document Revised: 10/22/2016 Document Reviewed: 10/22/2016  1) EKG- stable today 2) CXR- stable 3) Increased HCTZ 25mg  to twice daily 4) Start once daily Potassium Chloride 20 mEq 5) We will call you when lab results are available 6) Cardiology referral placed 7) If Red Flag symptoms develop (ie, chest pain, chest pressure, profuse sweating, nausea/vomiting, jaw pain) seek immediate medical assistance. 8) Bariatric referral placed 9) Follow-up in 6 months, sooner if needed. NICE TO SEE YOU!

## 2018-04-07 NOTE — Progress Notes (Signed)
Subjective:    Patient ID: Eduardo [name], male    DOB: 1971-05-16, 47 y.o.   MRN: [id number]  HPI :  Eduardo [name] presents for discussion of bariatric surgical referral. Current wt 357, he was seen by [facility] 04/04/18 and wt was 352. He report sig increase in bil lower extremity edema. He also reports increase in dyspnea with any exertion, he states "even tying my shoes makes my tired". He denies chest pain or chest pressure He continues to smoke >pack/day, he plans on starting Nicoderm patch He reports continues L foot/ankle pain- he is in walking boot   Patient Care Team    Relationship Specialty Notifications Start End  Mina Marble D, NP PCP - General Family Medicine  03/03/17   Allyn Kenner, MD  Dermatology  04/06/17   Charlett Blake, MD Consulting Physician Physical Medicine and Rehabilitation  11/11/17     Patient Active Problem List   Diagnosis Date Noted  . Shortness of breath 04/07/2018  . Lower extremity edema 04/07/2018  . Stasis dermatitis of both legs 04/07/2018  . Hypokalemia 04/07/2018  . Healthcare maintenance 04/07/2018  . Great toe pain, left 03/08/2018  . Acute gout involving toe of left foot 03/08/2018  . Low libido 02/09/2018  . Sleep apnea 02/03/2018  . Status post nasal septoplasty 02/03/2018  . Deviated septum 11/11/2017  . Umbilical hernia without obstruction and without gangrene 11/11/2017  . Scrotal pain 11/11/2017  . Scrotal cyst 11/11/2017  . Screening, lipid 11/11/2017  . Screening for diabetes mellitus (DM) 11/11/2017  . Screening for thyroid disorder 11/11/2017  . Anxiety and depression 08/11/2017  . Acute maxillary sinusitis 08/11/2017  . 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 Lumbar degenerative disc disease 08/02/2017  . Acute left ankle pain 07/12/2017  . Skin infection 06/03/2017  . Chronic folliculitis 16/10/930  . Blackhead 04/10/2017  . Cellulitis and abscess of trunk s/p I&D 04/10/2017 04/06/2017  .  Fever chills 04/06/2017  . History of MRSA infection 04/06/2017  . Physical exam, annual 03/03/2017  . Neuropathy 09/28/2015  . Major depression 12/26/2012  . Generalized anxiety disorder 12/26/2012  . Opiate dependence ([place]) 12/24/2012  . Benzodiazepine dependence ([place]) 12/24/2012  . HTN (hypertension) 12/24/2012  . 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 Morbid obesity ([place]) 09/09/2010  . TOBACCO ABUSE 09/09/2010  . LOW BACK PAIN, CHRONIC 09/09/2010     Past Medical History:  Diagnosis Date  . Anxiety   . Chronic back pain   . Degenerative disk disease   . Depression   . DVT of upper extremity (deep vein thrombosis) ([place])   . GERD (gastroesophageal reflux disease)   . Hypertension   . MRSA (methicillin resistant staph aureus) culture positive   . Peripheral neuropathy   . Pilonidal cyst   . Pulmonary embolus ([place])    no blood thinner 2002  . Sleep apnea    can't use cpap due to deviated nasal septumg     Past Surgical History:  Procedure Laterality Date  . Irrigation and debridement of back abscess    . NASAL SEPTOPLASTY W/ TURBINOPLASTY [name] 02/03/2018   Procedure: NASAL SEPTOPLASTY WITH TURBINATE REDUCTION;  Surgeon: [name], MD;  Location: [facility];  Service: ENT;  Laterality: [name];  . PILONIDAL CYST DRAINAGE [name] 04/10/2017   Procedure: IRRIGATION AND DEBRIDEMENT BACK ABSCESS;  Surgeon: [name], MD;  Location[facility];  Service: General;  Laterality: [name];  . SINUS ENDO WITH FUSION [name] 02/03/2018   Procedure: ENDOSCOPIC  SINUS SURGERY WITH FUSION;  Surgeon: [name], MD;  Location: [facility];  Service: ENT;  Laterality: [name];  . THORACIC OUTLET SURGERY  2000  . WRIST FUSION  2002     Family History  Problem Relation Age of Onset  . Cancer Maternal Grandmother        breast, ovarian & colon  . Hypertension Mother   . Diabetes Mother   . Hypertension Father   . Heart disease Father   . Stroke Paternal Grandfather   . Neuropathy Neg Hx      Social History    Substance and Sexual Activity  Drug Use No     Social History   Substance and Sexual Activity  Alcohol Use No  . Alcohol/week: 0.0 oz   Comment: Rarely     Social History   Tobacco Use  Smoking Status Current Every Day Smoker  . Packs/day: 1.00  . Years: 33.00  . Pack years: 33.00  . Types: Cigarettes  Smokeless Tobacco Never Used     Outpatient Encounter Medications as of 04/07/2018  Medication Sig Note  . amLODipine (NORVASC) 2.5 MG tablet Take 1 tablet (2.5 mg total) by mouth daily.   . fluticasone (FLONASE) 50 MCG/ACT nasal spray Place 2 sprays into both nostrils daily.   . hydrochlorothiazide (HYDRODIURIL) 25 MG tablet Take 1 tablet (25 mg total) by mouth 2 (two) times daily.   [name] LYRICA 300 MG capsule TAKE 1 CAPSULE BY MOUTH TWICE A DAY   . modafinil (PROVIGIL) 100 MG tablet Take 1 tablet (100 mg total) by mouth daily.   . montelukast (SINGULAIR) 10 MG tablet Take 1 tablet (10 mg total) by mouth at bedtime.   . Nutritional Supplements (JUICE PLUS FIBRE PO) Take 4 each by mouth daily with lunch. 01/24/2018: Fruit and vegatable  . predniSONE (DELTASONE) 20 MG tablet 1 tab every 12 hrs for 3 days, then daily for 3 days   . traMADol (ULTRAM) 50 MG tablet Take 1 tablet (50 mg total) by mouth every 6 (six) hours as needed.   . [DISCONTINUED] hydrochlorothiazide (HYDRODIURIL) 25 MG tablet Take 1 tablet (25 mg total) by mouth daily.   . potassium chloride SA (K-DUR,KLOR-CON) 20 MEQ tablet Take 1 tablet (20 mEq total) by mouth daily.    No facility-administered encounter medications on file as of 04/07/2018.     Allergies: Pollen extract; Dust mite extract; and Other  Body mass index is 43.47 kg/m.  Blood pressure 127/79, pulse 94, height 6' 3.98" (1.93 m), weight (!) 357 lb (161.9 kg), SpO2 94 %.   Sat 04/04/18 Sat 93%  Review of Systems  Constitutional: Positive for activity change and fatigue. Negative for appetite change, chills, diaphoresis, fever and unexpected  weight change.  Eyes: Negative for visual disturbance.  Respiratory: Positive for cough, shortness of breath and wheezing. Negative for chest tightness and stridor.   Cardiovascular: Negative for chest pain, palpitations and leg swelling.  Musculoskeletal: Positive for arthralgias, back pain, gait problem, joint swelling and myalgias.  Neurological: Negative for dizziness and headaches.  Hematological: Does not bruise/bleed easily.  Psychiatric/Behavioral: Positive for dysphoric mood.       Objective:   Physical Exam  Constitutional: He is oriented to person, place, and time. He appears well-developed and well-nourished. No distress.  HENT:  Head: Normocephalic and atraumatic.  Right Ear: External ear normal.  Left Ear: External ear normal.  Eyes: Pupils are equal, round, and reactive to light. Conjunctivae and EOM are normal.  Cardiovascular:  Normal rate, regular rhythm, normal heart sounds and intact distal pulses.  No murmur heard. Pulmonary/Chest: Effort normal. No stridor. No respiratory distress. He has no decreased breath sounds. He has wheezes in the right upper field, the right middle field, the left upper field and the left middle field. He has no rhonchi. He has no rales. He exhibits no tenderness.  Musculoskeletal: He exhibits edema and tenderness.       Right lower leg: He exhibits swelling.       Left lower leg: He exhibits swelling.       Right foot: There is swelling.       Left foot: There is swelling.  Neurological: He is alert and oriented to person, place, and time.  Skin: Skin is warm. Capillary refill takes less than 2 seconds. No rash noted. He is diaphoretic. There is erythema. No pallor.  Bil lower extremities stasis dermatitis of  Psychiatric: He has a normal mood and affect. His behavior is normal. Judgment and thought content normal.      Assessment & Plan:   1. Shortness of breath   2. Morbid obesity ([place])   3. Lower extremity edema   4. Stasis  dermatitis of both legs   5. TOBACCO ABUSE   6. Hypokalemia   7. Healthcare maintenance     Morbid obesity ([place]) Body mass index is 43.47 kg/m.  Current wt 357, up from 352 on 04/04/18 at [facility]   Shortness of breath EKG- 01/31/18 EKG - NSR He denies CP/chest pressure CXR- IMPRESSION: Suspect chronic bronchitis. No frank edema or consolidation. No adenopathy.   Lower extremity edema CMP drawn Increased HCTZ to 25mg  BID Started on Potassium Chloride 20 mEq QD  Healthcare maintenance 1) EKG- stable today 2) CXR- stable 3) Increased HCTZ 25mg  to twice daily 4) Start once daily Potassium Chloride 20 mEq 5) We will call you when lab results are available 6) Cardiology referral placed 7) If Red Flag symptoms develop (ie, chest pain, chest pressure, profuse sweating, nausea/vomiting, jaw pain) seek immediate medical assistance. 8) Bariatric referral placed 9) Follow-up in 6 months, sooner if needed.    FOLLOW-UP:  No follow-ups on file.

## 2018-04-07 NOTE — Addendum Note (Signed)
Addended by: Diana Eves on: 04/07/2018 05:00 PM   Modules accepted: Orders

## 2018-04-07 NOTE — Assessment & Plan Note (Signed)
1) EKG- stable today 2) CXR- stable 3) Increased HCTZ 25mg  to twice daily 4) Start once daily Potassium Chloride 20 mEq 5) We will call you when lab results are available 6) Cardiology referral placed 7) If Red Flag symptoms develop (ie, chest pain, chest pressure, profuse sweating, nausea/vomiting, jaw pain) seek immediate medical assistance. 8) Bariatric referral placed 9) Follow-up in 6 months, sooner if needed.

## 2018-04-07 NOTE — Assessment & Plan Note (Signed)
CMP drawn Increased HCTZ to 25mg  BID Started on Potassium Chloride 20 mEq QD

## 2018-04-07 NOTE — Assessment & Plan Note (Signed)
Body mass index is 43.47 kg/m.  Current wt 357, up from 352 on 04/04/18 at [facility]

## 2018-04-07 NOTE — Assessment & Plan Note (Addendum)
EKG- 01/31/18 EKG - NSR He denies CP/chest pressure CXR- IMPRESSION: Suspect chronic bronchitis. No frank edema or consolidation. No Adenopathy. Sat 94%- which is his baseline

## 2018-04-08 LAB — COMPREHENSIVE METABOLIC PANEL
ALT: 19 IU/L (ref 0–44)
AST: 14 IU/L (ref 0–40)
Albumin/Globulin Ratio: 1.4 (ref 1.2–2.2)
Albumin: 4.1 g/dL (ref 3.5–5.5)
Alkaline Phosphatase: 100 IU/L (ref 39–117)
BUN/Creatinine Ratio: 16 (ref 9–20)
BUN: 20 mg/dL (ref 6–24)
Bilirubin Total: 0.6 mg/dL (ref 0.0–1.2)
CO2: 29 mmol/L (ref 20–29)
Calcium: 9.3 mg/dL (ref 8.7–10.2)
Chloride: 99 mmol/L (ref 96–106)
Creatinine, Ser: 1.23 mg/dL (ref 0.76–1.27)
GFR calc Af Amer: 79 mL/min/{1.73_m2} (ref >59)
GFR calc non Af Amer: 69 mL/min/{1.73_m2} (ref >59)
Globulin, Total: 2.9 g/dL (ref 1.5–4.5)
Glucose: 100 mg/dL — ABNORMAL HIGH (ref 65–99)
Potassium: 3.5 mmol/L (ref 3.5–5.2)
Sodium: 142 mmol/L (ref 134–144)
Total Protein: 7 g/dL (ref 6.0–8.5)

## 2018-04-19 ENCOUNTER — Ambulatory Visit (INDEPENDENT_AMBULATORY_CARE_PROVIDER_SITE_OTHER): Payer: BC Managed Care – PPO | Admitting: Orthopaedic Surgery

## 2018-04-19 ENCOUNTER — Other Ambulatory Visit: Payer: Self-pay | Admitting: Adult Health

## 2018-04-19 ENCOUNTER — Other Ambulatory Visit (INDEPENDENT_AMBULATORY_CARE_PROVIDER_SITE_OTHER): Payer: Self-pay

## 2018-04-19 ENCOUNTER — Encounter (INDEPENDENT_AMBULATORY_CARE_PROVIDER_SITE_OTHER): Payer: Self-pay | Admitting: Orthopaedic Surgery

## 2018-04-19 DIAGNOSIS — Z6841 Body Mass Index (BMI) 40.0 and over, adult: Secondary | ICD-10-CM

## 2018-04-19 DIAGNOSIS — M79672 Pain in left foot: Secondary | ICD-10-CM | POA: Diagnosis not present

## 2018-04-19 DIAGNOSIS — M25572 Pain in left ankle and joints of left foot: Secondary | ICD-10-CM

## 2018-04-19 NOTE — Progress Notes (Signed)
Office Visit Note   Patient: Eduardo [name]           Date of Birth: 1971-02-10           MRN: [id number] Visit Date: 04/19/2018              Requested by: [name], NP [place], [place] [address] PCP: [name], NP   Assessment & Plan: Visit Diagnoses:  1. Morbid obesity ([place])   2. Body mass index 40.0-44.9, adult (HCC)   3. Pain in left foot     Plan: Impression is questionable posterior tibial tendon dysfunction for stress fracture.  At this point, we will obtain an MRI to further assess the structures.  He will follow-up with [place] once this has been completed.  In the meantime, he will remain weightbearing as tolerated in a cam walker.  Follow-Up Instructions: Return in about 10 days (around 04/29/2018).   Orders:  No orders of the defined types were placed in this encounter.  No orders of the defined types were placed in this encounter.     Procedures: No procedures performed   Clinical Data: No additional findings.   Subjective: Chief Complaint  Patient presents with  . Left Foot - Pain    HPI patient is a pleasant 47 year old gentleman who comes in today for recheck of his left foot.  He has had several months of left foot pain which is worse with ambulation.  Prior to his visit on 03/22/2018, he had a high range of normal uric acid level.  We placed him in a cam walker weightbearing as tolerated as well as started him on a prednisone taper.  He notes no improvement of symptoms.    Review of Systems as detailed in HPI.  All others reviewed and are negative.   Objective: Vital Signs: There were no vitals taken for this visit.  Physical Exam well-developed and well-nourished gentleman in no acute distress.  Alert and oriented x3.  Ortho Exam examination of the left foot reveals a flexible Pez planus.  Moderate tenderness along the posterior tibial tendon.  Full range of motion of the ankle and toes.  No signs of cellulitis or  infection.  Specialty Comments:  No specialty comments available.  Imaging:   No new imaging   PMFS History: Patient Active Problem List   Diagnosis Date Noted  . Body mass index 40.0-44.9, adult ([place]) 04/19/2018  . Pain in left foot 04/19/2018  . Shortness of breath 04/07/2018  . Lower extremity edema 04/07/2018  . Stasis dermatitis of both legs 04/07/2018  . Hypokalemia 04/07/2018  . Healthcare maintenance 04/07/2018  . Great toe pain, left 03/08/2018  . Acute gout involving toe of left foot 03/08/2018  . Low libido 02/09/2018  . Sleep apnea 02/03/2018  . Status post nasal septoplasty 02/03/2018  . Deviated septum 11/11/2017  . Umbilical hernia without obstruction and without gangrene 11/11/2017  . Scrotal pain 11/11/2017  . Scrotal cyst 11/11/2017  . Screening, lipid 11/11/2017  . Screening for diabetes mellitus (DM) 11/11/2017  . Screening for thyroid disorder 11/11/2017  . Anxiety and depression 08/11/2017  . Acute maxillary sinusitis 08/11/2017  . 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 Lumbar degenerative disc disease 08/02/2017  . Acute left ankle pain 07/12/2017  . Skin infection 06/03/2017  . Chronic folliculitis 76/28/3151  . Blackhead 04/10/2017  . Cellulitis and abscess of trunk s/p I&D 04/10/2017 04/06/2017  . Fever chills 04/06/2017  .  History of MRSA infection 04/06/2017  . Physical exam, annual 03/03/2017  . Neuropathy 09/28/2015  . Major depression 12/26/2012  . Generalized anxiety disorder 12/26/2012  . Opiate dependence ([place]) 12/24/2012  . Benzodiazepine dependence ([place]) 12/24/2012  . HTN (hypertension) 12/24/2012  . 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 Morbid obesity ([place]) 09/09/2010  . TOBACCO ABUSE 09/09/2010  . LOW BACK PAIN, CHRONIC 09/09/2010   Past Medical History:  Diagnosis Date  . Anxiety   . Chronic back pain   . Degenerative disk disease   . Depression   . DVT of upper extremity (deep vein thrombosis) ([place])     . GERD (gastroesophageal reflux disease)   . Hypertension   . MRSA (methicillin resistant staph aureus) culture positive   . Peripheral neuropathy   . Pilonidal cyst   . Pulmonary embolus ([place])    no blood thinner 2002  . Sleep apnea    can't use cpap due to deviated nasal septumg    Family History  Problem Relation Age of Onset  . Cancer Maternal Grandmother        breast, ovarian & colon  . Hypertension Mother   . Diabetes Mother   . Hypertension Father   . Heart disease Father   . Stroke Paternal Grandfather   . Neuropathy Neg Hx     Past Surgical History:  Procedure Laterality Date  . Irrigation and debridement of back abscess    . NASAL SEPTOPLASTY W/ TURBINOPLASTY [name] 02/03/2018   Procedure: NASAL SEPTOPLASTY WITH TURBINATE REDUCTION;  Surgeon: [name], MD;  Location: [facility];  Service: ENT;  Laterality: [name];  . PILONIDAL CYST DRAINAGE [name] 04/10/2017   Procedure: IRRIGATION AND DEBRIDEMENT BACK ABSCESS;  Surgeon: [name], MD;  [facility];  Service: General;  Laterality: [name];  . SINUS ENDO WITH FUSION [name] 02/03/2018   Procedure: ENDOSCOPIC SINUS SURGERY WITH FUSION;  Surgeon: [name], MD;  Location: [facility];  Service: ENT;  Laterality: [name];  . THORACIC OUTLET SURGERY  2000  . WRIST FUSION  2002   Social History   Occupational History  . Occupation: [name]  Tobacco Use  . Smoking status: Current Every Day Smoker    Packs/day: 1.00    Years: 33.00    Pack years: 33.00    Types: Cigarettes  . Smokeless tobacco: Never Used  Substance and Sexual Activity  . Alcohol use: No    Alcohol/week: 0.0 oz    Comment: Rarely  . Drug use: No  . Sexual activity: Yes    Birth control/protection: None

## 2018-04-26 ENCOUNTER — Other Ambulatory Visit: Payer: Self-pay

## 2018-04-26 MED ORDER — TRAMADOL HCL 50 MG PO TABS: 100.0000 mg | ORAL_TABLET | Freq: Four times a day (QID) | ORAL | 1 refills | Status: DC | PRN

## 2018-04-26 NOTE — Telephone Encounter (Signed)
Patient called stating medication was not working as well for him, stated that he mentioned this to Dr. Aretta Nip on his last visit which was 04-04-18.  He also stated that he had taken an increased amount then what was written on the bottle which stated take 1 tab every 6 hrs for pain 240 tabs 1 refill.   Noted on Dr. Letta Pate last note:  Will temporarily increase tramadol to 100 mg 4 times daily.  Would avoid SSRIs or tricyclic antidepressants while patient is on a high dose tramadol.  I discussed that patient should not need to be on this higher dose for more than a month or 2.  He will then back down to his usual dose but may need to start duloxetine in conjunction  I can reorder the patients tramadol with the correct dosing instructions, but should the patient also receive the duloxetine as well?  Please advise.

## 2018-04-26 NOTE — Telephone Encounter (Signed)
Stop Duloxetine Trial Tramadol 100mg  QID x 25month Call if no improvement or make f/u visit

## 2018-04-26 NOTE — Telephone Encounter (Signed)
Medication called into pharmacy for patient, patient notified.

## 2018-04-27 ENCOUNTER — Ambulatory Visit (INDEPENDENT_AMBULATORY_CARE_PROVIDER_SITE_OTHER): Payer: BC Managed Care – PPO | Admitting: Orthopaedic Surgery

## 2018-04-28 ENCOUNTER — Ambulatory Visit
Admission: RE | Admit: 2018-04-28 | Discharge: 2018-04-28 | Disposition: A | Discharge status: Home / Self Care | Payer: BC Managed Care – PPO | Source: Ambulatory Visit | Attending: Orthopaedic Surgery | Admitting: Orthopaedic Surgery

## 2018-04-28 DIAGNOSIS — M25572 Pain in left ankle and joints of left foot: Secondary | ICD-10-CM

## 2018-05-03 ENCOUNTER — Ambulatory Visit (INDEPENDENT_AMBULATORY_CARE_PROVIDER_SITE_OTHER): Payer: BC Managed Care – PPO | Admitting: Orthopaedic Surgery

## 2018-05-03 DIAGNOSIS — M25572 Pain in left ankle and joints of left foot: Secondary | ICD-10-CM | POA: Diagnosis not present

## 2018-05-03 DIAGNOSIS — Z6841 Body Mass Index (BMI) 40.0 and over, adult: Secondary | ICD-10-CM

## 2018-05-03 NOTE — Progress Notes (Signed)
Office Visit Note   Patient: Eduardo [name]           Date of Birth: 1971/05/09           MRN: [id number] Visit Date: 05/03/2018              Requested by: [name], NP [place], [place] [address] PCP: [name], NP   Assessment & Plan: Visit Diagnoses:  1. Pain in left ankle and joints of left foot   2. Morbid obesity ([place])   3. Body mass index 40.0-44.9, adult (HCC)     Plan: Impression is posterior ankle impingement and plantar fasciitis with severe talonavicular arthritis.  These findings were reviewed with the patient.  Patient is in the process of undergoing gastric bypass surgery.  I recommend that he get this taken care of first before contemplating surgery for his foot and ankle.  I think that given his downtime and weight loss that is expected from his gastric bypass that there is a good chance that his foot may feel better.  He is in agreement.  We will see him back as needed.  Follow-Up Instructions: Return if symptoms worsen or fail to improve.   Orders:  No orders of the defined types were placed in this encounter.  No orders of the defined types were placed in this encounter.     Procedures: No procedures performed   Clinical Data: No additional findings.   Subjective: Chief Complaint  Patient presents with  . Left Foot - Pain    Patient is here for MRI review.  No changes in medical history.   Review of Systems   Objective: Vital Signs: There were no vitals taken for this visit.  Physical Exam  Ortho Exam Left foot and ankle exam shows tenderness at the plantar fascia insertion.  He also has popping and clicking in the posterior lateral ankle joint. Specialty Comments:  No specialty comments available.  Imaging: No results found.   PMFS History: Patient Active Problem List   Diagnosis Date Noted  . Body mass index 40.0-44.9, adult ([place]) 04/19/2018  . Pain in left foot 04/19/2018  . Shortness of breath  04/07/2018  . Lower extremity edema 04/07/2018  . Stasis dermatitis of both legs 04/07/2018  . Hypokalemia 04/07/2018  . Healthcare maintenance 04/07/2018  . Great toe pain, left 03/08/2018  . Acute gout involving toe of left foot 03/08/2018  . Low libido 02/09/2018  . Sleep apnea 02/03/2018  . Status post nasal septoplasty 02/03/2018  . Deviated septum 11/11/2017  . Umbilical hernia without obstruction and without gangrene 11/11/2017  . Scrotal pain 11/11/2017  . Scrotal cyst 11/11/2017  . Screening, lipid 11/11/2017  . Screening for diabetes mellitus (DM) 11/11/2017  . Screening for thyroid disorder 11/11/2017  . Anxiety and depression 08/11/2017  . Acute maxillary sinusitis 08/11/2017  . 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 Lumbar degenerative disc disease 08/02/2017  . Acute left ankle pain 07/12/2017  . Skin infection 06/03/2017  . Chronic folliculitis 16/04/3709  . Blackhead 04/10/2017  . Cellulitis and abscess of trunk s/p I&D 04/10/2017 04/06/2017  . Fever chills 04/06/2017  . History of MRSA infection 04/06/2017  . Physical exam, annual 03/03/2017  . Neuropathy 09/28/2015  . Major depression 12/26/2012  . Generalized anxiety disorder 12/26/2012  . Opiate dependence ([place]) 12/24/2012  . Benzodiazepine dependence ([place]) 12/24/2012  . HTN (hypertension) 12/24/2012  . 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 Morbid obesity (  [place]) 09/09/2010  . TOBACCO ABUSE 09/09/2010  . LOW BACK PAIN, CHRONIC 09/09/2010   Past Medical History:  Diagnosis Date  . Anxiety   . Chronic back pain   . Degenerative disk disease   . Depression   . DVT of upper extremity (deep vein thrombosis) ([place])   . GERD (gastroesophageal reflux disease)   . Hypertension   . MRSA (methicillin resistant staph aureus) culture positive   . Peripheral neuropathy   . Pilonidal cyst   . Pulmonary embolus ([place])    no blood thinner 2002  . Sleep apnea    can't use cpap due to deviated  nasal septumg    Family History  Problem Relation Age of Onset  . Cancer Maternal Grandmother        breast, ovarian & colon  . Hypertension Mother   . Diabetes Mother   . Hypertension Father   . Heart disease Father   . Stroke Paternal Grandfather   . Neuropathy Neg Hx     Past Surgical History:  Procedure Laterality Date  . Irrigation and debridement of back abscess    . NASAL SEPTOPLASTY W/ TURBINOPLASTY [name] 02/03/2018   Procedure: NASAL SEPTOPLASTY WITH TURBINATE REDUCTION;  Surgeon: [name], MD;  Location: [facility];  Service: ENT;  Laterality: [name];  . PILONIDAL CYST DRAINAGE [name] 04/10/2017   Procedure: IRRIGATION AND DEBRIDEMENT BACK ABSCESS;  Surgeon: [name], MD;  [facility];  Service: General;  Laterality: [name];  . SINUS ENDO WITH FUSION [name] 02/03/2018   Procedure: ENDOSCOPIC SINUS SURGERY WITH FUSION;  Surgeon: [name], MD;  Location: [facility];  Service: ENT;  Laterality: [name];  . THORACIC OUTLET SURGERY  2000  . WRIST FUSION  2002   Social History   Occupational History  . Occupation: [name]  Tobacco Use  . Smoking status: Current Every Day Smoker    Packs/day: 1.00    Years: 33.00    Pack years: 33.00    Types: Cigarettes  . Smokeless tobacco: Never Used  Substance and Sexual Activity  . Alcohol use: No    Alcohol/week: 0.0 oz    Comment: Rarely  . Drug use: No  . Sexual activity: Yes    Birth control/protection: None

## 2018-05-05 ENCOUNTER — Ambulatory Visit (INDEPENDENT_AMBULATORY_CARE_PROVIDER_SITE_OTHER): Payer: BC Managed Care – PPO | Admitting: Pulmonary Disease

## 2018-05-05 ENCOUNTER — Encounter: Payer: Self-pay | Admitting: Pulmonary Disease

## 2018-05-05 VITALS — BP 128/76 | HR 95 | Ht 77.0 in | Wt 343.0 lb

## 2018-05-05 DIAGNOSIS — R0602 Shortness of breath: Secondary | ICD-10-CM | POA: Diagnosis not present

## 2018-05-05 DIAGNOSIS — F17200 Nicotine dependence, unspecified, uncomplicated: Secondary | ICD-10-CM

## 2018-05-05 DIAGNOSIS — G4733 Obstructive sleep apnea (adult) (pediatric): Secondary | ICD-10-CM | POA: Diagnosis not present

## 2018-05-05 NOTE — Assessment & Plan Note (Signed)
Unfortunately he has severe OSA and dental appliance is not an option.  Due to obesity he would not be a good candidate for inspire device.  CPAP is his only option and weight loss in the long run. Due to severe claustrophobia, he will need desensitization, we will refer to our sleep tech to put him on nasal pillows in the daytime and see if we can desensitize him.  He will not be able to tolerate a full facemask but may be able to tolerate nasal pillows with chinstrap now that he has had sinus surgery  Based on this, I will write a prescription for auto CPAP 5 to 12 cm and gradually increase the pressure as he tolerates

## 2018-05-05 NOTE — Assessment & Plan Note (Signed)
Smoking cessation seems to be the most important intervention for his dyspnea at this time Surprisingly he does not have significant airway obstruction. We will consider screening for lung cancer in future visits if he continues to smoke

## 2018-05-05 NOTE — Patient Instructions (Addendum)
Call sleep lab [phone] schedule appointment for desensitization with nasal pillows, if that works then we will send in prescription for auto CPAP for you to trial at home with chinstrap  Lung function appears okay. Use albuterol 2 puffs every 6 hours as needed  You have to quit smoking ! Quit line

## 2018-05-05 NOTE — Progress Notes (Signed)
Subjective:    Patient ID: Eduardo [name], male    DOB: January 07, 1971, 47 y.o.   MRN: [id number]  HPI  Chief Complaint  Patient presents with  . sleep consult    prior sleep study around 12/2017- not currently wearing cpap. c/o daytime sleepiness & loud snoring   47 year old obese man referred for evaluation of severe OSA and shortness of breath. He works as a Human resources officer for [employer].  He presented with excessive daytime somnolence and fatigue. Epworth sleepiness score is 22. Bedtime is between 9 and 10 PM, loud snoring has been noted by his stepmom and girlfriend, sleep latency is minimal, sleeps on his back with one pillow, reports 5-6 nocturnal awakenings due to nocturia and pain in his foot and back, is out of bed by 5 AM feeling tired with dryness of mouth, reports frequent naps during the day. His weight has fluctuated and is 40 pounds up to the last 2 years. There is no history suggestive of cataplexy, sleep paralysis or parasomnias  NPSG 11/2017 showed severe OSA with AHI of 83/7 and was desaturation of 87%, unfortunately could not tolerate mask due to claustrophobia. He was referred to ENT and underwent septoplasty, was found to have chronic pansinusitis also underwent nasal endoscopy cleaning with the hope that he would tolerate nasal pillows   He smokes a pack a day starting as a teenager, more than 50 pack years.  He reports increasing dyspnea over the past year especially when walking up an incline.  He denies frequent chest colds but reports intermittent wheezing.  Spirometry was performed and showed ratio of 83, and with FVC of 71% suggestive of mild restriction. Chest x-ray 03/2018 was reviewed which shows mild interstitial prominence.     Past Medical History:  Diagnosis Date  . Anxiety   . Chronic back pain   . Degenerative disk disease   . Depression   . DVT of upper extremity (deep vein thrombosis) ([place])   . GERD (gastroesophageal reflux  disease)   . Hypertension   . MRSA (methicillin resistant staph aureus) culture positive   . Peripheral neuropathy   . Pilonidal cyst   . Pulmonary embolus ([place])    no blood thinner 2002  . Sleep apnea    can't use cpap due to deviated nasal septumg   Past Surgical History:  Procedure Laterality Date  . Irrigation and debridement of back abscess    . NASAL SEPTOPLASTY W/ TURBINOPLASTY [name] 02/03/2018   Procedure: NASAL SEPTOPLASTY WITH TURBINATE REDUCTION;  Surgeon: [name], MD;  Location: [facility];  Service: ENT;  Laterality: [name];  . PILONIDAL CYST DRAINAGE [name] 04/10/2017   Procedure: IRRIGATION AND DEBRIDEMENT BACK ABSCESS;  Surgeon: [name], MD;  Location: [facility];  Service: General;  Laterality: [name];  . SINUS ENDO WITH FUSION [name] 02/03/2018   Procedure: ENDOSCOPIC SINUS SURGERY WITH FUSION;  Surgeon: [name], MD;  Location: [facility];  Service: ENT;  Laterality: [name];  . THORACIC OUTLET SURGERY  2000  . WRIST FUSION  2002    Allergies  Allergen Reactions  . Pollen Extract Cough  . Dust Mite Extract   . Other     Ragweed     Social History   Socioeconomic History  . Marital status: Divorced    Spouse name: Not on file  . Number of children: 2  . Years of education: 12th   . Highest education level: Not on file  Occupational History  . Occupation: [name]  Social Needs  . Financial resource strain: Not on file  . Food insecurity:    Worry: Not on file    Inability: Not on file  . Transportation needs:    Medical: Not on file    Non-medical: Not on file  Tobacco Use  . Smoking status: Current Every Day Smoker    Packs/day: 1.00    Years: 33.00    Pack years: 33.00    Types: Cigarettes  . Smokeless tobacco: Never Used  Substance and Sexual Activity  . Alcohol use: No    Alcohol/week: 0.0 oz    Comment: Rarely  . Drug use: No  . Sexual activity: Yes    Birth control/protection: None  Lifestyle  . Physical activity:    Days per week: Not on file     Minutes per session: Not on file  . Stress: Not on file  Relationships  . Social connections:    Talks on phone: Not on file    Gets together: Not on file    Attends religious service: Not on file    Active member of club or organization: Not on file    Attends meetings of clubs or organizations: Not on file    Relationship status: Not on file  . Intimate partner violence:    Fear of current or ex partner: Not on file    Emotionally abused: Not on file    Physically abused: Not on file    Forced sexual activity: Not on file  Other Topics Concern  . Not on file  Social History Narrative   Lives at home w/ his step-mother   Right-handed   Drinks about 2-3 [place] [address] a day    Family History  Problem Relation Age of Onset  . Cancer Maternal Grandmother        breast, ovarian & colon  . Hypertension Mother   . Diabetes Mother   . Hypertension Father   . Heart disease Father   . Stroke Paternal Grandfather   . Neuropathy Neg Hx      Review of Systems  Positive for shortness of breath, weight gain, nasal congestion, anxiety depression   Constitutional: negative for anorexia, fevers and sweats  Eyes: negative for irritation, redness and visual disturbance  Ears, nose, mouth, throat, and face: negative for earaches, epistaxis, nasal congestion and sore throat  Respiratory: negative for cough,  sputum and wheezing  Cardiovascular: negative for chest pain,  lower extremity edema, orthopnea, palpitations and syncope  Gastrointestinal: negative for abdominal pain, constipation, diarrhea, melena, nausea and vomiting  Genitourinary:negative for dysuria, frequency and hematuria  Hematologic/lymphatic: negative for bleeding, easy bruising and lymphadenopathy  Musculoskeletal:negative for arthralgias, muscle weakness and stiff joints  Neurological: negative for coordination problems, gait problems, headaches and weakness  Endocrine: negative for diabetic symptoms including polydipsia,  polyuria and weight loss     Objective:   Physical Exam  Gen. Pleasant, obese, in no distress, normal affect ENT - long uvula, no post nasal drip, class 2-3 airway, neck size 20 " Neck: No JVD, no thyromegaly, no carotid bruits Lungs: no use of accessory muscles, no dullness to percussion, decreased without rales or rhonchi  Cardiovascular: Rhythm regular, heart sounds  normal, no murmurs or gallops, no peripheral edema Abdomen: soft and non-tender, no hepatosplenomegaly, BS normal. Musculoskeletal: No deformities, no cyanosis or clubbing Neuro:  alert, non focal, no tremors       Assessment & Plan:

## 2018-05-06 ENCOUNTER — Telehealth: Payer: Self-pay | Admitting: Pulmonary Disease

## 2018-05-06 MED ORDER — ALBUTEROL SULFATE HFA 108 (90 BASE) MCG/ACT IN AERS: 2.0000 | INHALATION_SPRAY | Freq: Four times a day (QID) | RESPIRATORY_TRACT | 2 refills | Status: DC | PRN

## 2018-05-06 NOTE — Telephone Encounter (Signed)
Rx for ventolin send to [place]  I called and spoke with the pt and notified that this was done  Nothing further needed

## 2018-05-09 ENCOUNTER — Telehealth: Payer: Self-pay | Admitting: *Deleted

## 2018-05-09 NOTE — Telephone Encounter (Signed)
Butrans patch 10 mcg change weekly #4, no refills

## 2018-05-09 NOTE — Telephone Encounter (Signed)
Left message for him to call us back with retail pharmacy. Eduardo [name] does not carry [facility].

## 2018-05-09 NOTE — Telephone Encounter (Signed)
Mr Eduardo [name] called to report that the tramadol is not working for him at all. He just found out he is going to be needing 4 surgeries on his foot and he needs something that will allow him to work until he can arrange to have these surgeries.  Please advise.

## 2018-05-10 ENCOUNTER — Telehealth: Payer: Self-pay | Admitting: Adult Health

## 2018-05-10 ENCOUNTER — Other Ambulatory Visit: Payer: Self-pay | Admitting: Adult Health

## 2018-05-10 MED ORDER — VARENICLINE TARTRATE 0.5 MG X 11 & 1 MG X 42 PO MISC: ORAL | 0 refills | Status: DC

## 2018-05-10 NOTE — Telephone Encounter (Signed)
See previous message about frequent sweating.

## 2018-05-10 NOTE — Telephone Encounter (Signed)
Jennifer called and left a message stating that he talked to [facility] and they said to send the prescription to them and they will take care of it.

## 2018-05-10 NOTE — Telephone Encounter (Signed)
Patient called request Chantix to assist with smoking cession( per patient patch will not stay on body due to the heavy sweating while at work)--- parient is requesting Rx be prescribed in a different form.    --Forwarding request to medical assistant to review w/ provider.   Pt uses:  [facility]      [place], [place] [phone] (Phone) [phone] (Fax)   Unable to locate any previous Rx for Chantix, f/u with patient if any questions.  --glh

## 2018-05-10 NOTE — Telephone Encounter (Signed)
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

## 2018-05-10 NOTE — Telephone Encounter (Signed)
RX sent to [facility].  Pt denies any thoughts of harming self or others.  Charyl Bigger, CMA

## 2018-05-10 NOTE — Telephone Encounter (Signed)
Morning Tonya, Rx printed-due to length of sig Please fax to pharmacy and call Mr. Lamere and tell him rx sent in. Please confirm that his mood is stable, ie: any thoughts of harming himself/others. Thanks! Valetta Fuller

## 2018-05-10 NOTE — Telephone Encounter (Signed)
Eduardo [name] said the problem with a patch is that he sweats a lot especially at work and it will likely not stay attached.  Please advise.

## 2018-05-11 MED ORDER — TAPENTADOL HCL 75 MG PO TABS: 75.0000 mg | ORAL_TABLET | Freq: Three times a day (TID) | ORAL | 0 refills | Status: DC | PRN

## 2018-05-11 NOTE — Telephone Encounter (Signed)
Medication sent.

## 2018-05-11 NOTE — Telephone Encounter (Signed)
Sent to [facility] to place narcotic order.

## 2018-05-11 NOTE — Telephone Encounter (Signed)
Eduardo [name] notified.

## 2018-05-16 ENCOUNTER — Telehealth: Payer: Self-pay | Admitting: Adult Health

## 2018-05-16 ENCOUNTER — Telehealth (INDEPENDENT_AMBULATORY_CARE_PROVIDER_SITE_OTHER): Payer: Self-pay | Admitting: Orthopaedic Surgery

## 2018-05-16 NOTE — Telephone Encounter (Signed)
See message.

## 2018-05-16 NOTE — Telephone Encounter (Signed)
Patient request a call back regarding the recovery time it will be for the foot surgery & if all 4 surgeries(they discussed) can be done at one time.

## 2018-05-16 NOTE — Telephone Encounter (Signed)
Left message for a return call

## 2018-05-16 NOTE — Telephone Encounter (Signed)
Patient wants to speak with nurse about his ongoing anxiety that we have been seeing him about. He has several upcoming appts (surgery and other providers), and between that and work he is having a hard time and wants to see if there is any way for Valetta Fuller to help, explained to patient he may need to come in but he wants to start with this message as it will be hard for him to do another OV with the time already he is taking off for upcoming appts. Please advise

## 2018-05-16 NOTE — Telephone Encounter (Signed)
2-3 months but I would refer him to a foot and ankle surgeon to discuss the surgeries further

## 2018-05-16 NOTE — Telephone Encounter (Signed)
I spoke with Eduardo [name] and he feels very scattered. He needs to have foot surgery and he has no PAL time saved. He is worried about the money. He only has Friday's off. Can he see Dr Raliegh Scarlet on a Friday for anxiety.

## 2018-05-17 ENCOUNTER — Other Ambulatory Visit: Payer: Self-pay | Admitting: Adult Health

## 2018-05-17 ENCOUNTER — Telehealth (INDEPENDENT_AMBULATORY_CARE_PROVIDER_SITE_OTHER): Payer: Self-pay | Admitting: Orthopaedic Surgery

## 2018-05-17 MED ORDER — FLUOXETINE HCL 20 MG PO CAPS: ORAL_CAPSULE | ORAL | 0 refills | Status: DC

## 2018-05-17 NOTE — Telephone Encounter (Signed)
Called patient to advise.  Where would you like to refer him?

## 2018-05-17 NOTE — Telephone Encounter (Signed)
[facility]DA

## 2018-05-17 NOTE — Telephone Encounter (Signed)
Scheduled patient with Dr Sharol Given 05/24/18 at 10:45am

## 2018-05-17 NOTE — Telephone Encounter (Signed)
See previous message

## 2018-05-17 NOTE — Telephone Encounter (Signed)
Patient called asked if he can have all 4 procedures done at once and what would be his recovery time? The number to contact patient is [phone]

## 2018-05-17 NOTE — Telephone Encounter (Signed)
Please make appt with Dr. Sharol Given.

## 2018-05-17 NOTE — Telephone Encounter (Signed)
Spoke with pt.   1)  Denies thoughts of harming self or others. 2) Smoking cessation:  Pt states that he has "cut down a little and is not craving cigarettes as bad" 3)  He is not consuming any alcohol.  Advised pt of RX.  Charyl Bigger, CMA

## 2018-05-17 NOTE — Telephone Encounter (Signed)
Good Morning Tonya, Can you please call Mr. Baranek and ask- 1) does he have any thoughts of harming himself/others? 2) How is the smoking cessation going? 3) Please confirm that he is not drinking ETOH 4) I started him on Fluoxetine 20mg - 1/2 tablet daily for week one, then increase to one full tablet daily Please have him either call or send in MyChart message in two weeks and let us know how he if feeling. Please let him to know to contact us with any questions/concerns. Thanks! Valetta Fuller

## 2018-05-18 ENCOUNTER — Telehealth: Payer: Self-pay | Admitting: *Deleted

## 2018-05-18 NOTE — Telephone Encounter (Signed)
Patient confirms that the Nucynta is not working. I contacted patient and informed that Dr. Letta Pate is on vacation until Monday July 5th. Patient verbalized understanding. He will wait for Dr. Letta Pate medical advise upon his return from vacation.

## 2018-05-18 NOTE — Telephone Encounter (Signed)
Patient left a message asking to speak to Dr. Letta Pate or one of his assistants.  He states his pain medications are not working. I tried to contact patient to confirm medication and inform that Dr. Letta Pate is out the rest of this week. Left a voicemail to have patient contact us to confirm medication. Last telephone encounter note indicates that Nucynta was prescribed and Dr. Letta Pate note states that he will not go any higher in strength due to admission of opioid/benzo detox.

## 2018-05-19 ENCOUNTER — Other Ambulatory Visit: Payer: Self-pay | Admitting: Physical Medicine & Rehabilitation

## 2018-05-19 DIAGNOSIS — E538 Deficiency of other specified B group vitamins: Secondary | ICD-10-CM

## 2018-05-19 DIAGNOSIS — G609 Hereditary and idiopathic neuropathy, unspecified: Secondary | ICD-10-CM

## 2018-05-20 ENCOUNTER — Other Ambulatory Visit: Payer: Self-pay | Admitting: Physical Medicine & Rehabilitation

## 2018-05-20 DIAGNOSIS — E538 Deficiency of other specified B group vitamins: Secondary | ICD-10-CM

## 2018-05-20 DIAGNOSIS — G609 Hereditary and idiopathic neuropathy, unspecified: Secondary | ICD-10-CM

## 2018-05-23 ENCOUNTER — Telehealth: Payer: Self-pay | Admitting: *Deleted

## 2018-05-23 NOTE — Telephone Encounter (Signed)
May increase Nucynta to 75 mg QID

## 2018-05-23 NOTE — Telephone Encounter (Signed)
Eduardo [name] called about his Lyrica being denied by this office. I called him back and informed him that I had called his refill to the pharmacy.

## 2018-05-23 NOTE — Telephone Encounter (Signed)
I have called Mr Paolini and informed him of the increase.  He will need to call us when he has about 2 days left so that we can send in a new Rx for Nucynta 75 mg qid.

## 2018-05-24 ENCOUNTER — Ambulatory Visit (INDEPENDENT_AMBULATORY_CARE_PROVIDER_SITE_OTHER): Payer: BC Managed Care – PPO | Admitting: Orthopedic Surgery

## 2018-05-24 DIAGNOSIS — Z6841 Body Mass Index (BMI) 40.0 and over, adult: Secondary | ICD-10-CM

## 2018-05-24 DIAGNOSIS — M25572 Pain in left ankle and joints of left foot: Secondary | ICD-10-CM | POA: Diagnosis not present

## 2018-05-25 ENCOUNTER — Other Ambulatory Visit (HOSPITAL_BASED_OUTPATIENT_CLINIC_OR_DEPARTMENT_OTHER): Payer: Self-pay

## 2018-06-02 ENCOUNTER — Ambulatory Visit (INDEPENDENT_AMBULATORY_CARE_PROVIDER_SITE_OTHER): Payer: BC Managed Care – PPO | Admitting: Cardiology

## 2018-06-02 ENCOUNTER — Encounter: Payer: Self-pay | Admitting: Cardiology

## 2018-06-02 VITALS — BP 102/70 | HR 80 | Ht 76.5 in | Wt 335.6 lb

## 2018-06-02 DIAGNOSIS — Z716 Tobacco abuse counseling: Secondary | ICD-10-CM

## 2018-06-02 DIAGNOSIS — F17200 Nicotine dependence, unspecified, uncomplicated: Secondary | ICD-10-CM | POA: Diagnosis not present

## 2018-06-02 DIAGNOSIS — G4733 Obstructive sleep apnea (adult) (pediatric): Secondary | ICD-10-CM | POA: Diagnosis not present

## 2018-06-02 DIAGNOSIS — I10 Essential (primary) hypertension: Secondary | ICD-10-CM | POA: Diagnosis not present

## 2018-06-02 DIAGNOSIS — R0602 Shortness of breath: Secondary | ICD-10-CM | POA: Diagnosis not present

## 2018-06-02 DIAGNOSIS — R600 Localized edema: Secondary | ICD-10-CM

## 2018-06-02 DIAGNOSIS — Z7189 Other specified counseling: Secondary | ICD-10-CM

## 2018-06-02 NOTE — Progress Notes (Signed)
Cardiology Office Note:    Date:  06/02/2018   ID:  [name], DOB 02/11/1971, MRN [id number]  PCP:  [name], NP  Cardiologist:  Buford Dresser, MD PhD  Referring MD: [name], NP   Chief Complaint  Patient presents with  . Shortness of Breath  . Dizziness    History of Present Illness:    Eduardo [name] is a 47 y.o. male with a hx of obesity, hypertension, chronic pain 2/2 foot arthritis who is seen as a new consult at the request of Mina Marble for evaluation of shortness of breath.  Patient concerns: shortness of breath. Has been feeling short of breath for several years. Worse when he has to walk fast or push himself. Does household chores without issue, climb stairs, but no routine exercise. No clear PND. Stable sleeping on 2 pillows. Has been diagnosed with sleep apnea but gets claustrophobic with the mask, is awaiting machine that uses nasal pillows to see if he can tolerate. Smoking 1 ppd for about 35 years. Taking chantix now and trying to quit.   No chest pain. Has chronic LE edema. No syncope. No palpitations.  Considering bariatric surgery for weight loss, hasn't started the evaluation process yet. Past Medical History:  Diagnosis Date  . Anxiety   . Chronic back pain   . Degenerative disk disease   . Depression   . DVT of upper extremity (deep vein thrombosis) ([place])   . GERD (gastroesophageal reflux disease)   . Hypertension   . MRSA (methicillin resistant staph aureus) culture positive   . Peripheral neuropathy   . Pilonidal cyst   . Pulmonary embolus ([place])    no blood thinner 2002  . Sleep apnea    can't use cpap due to deviated nasal septumg    Past Surgical History:  Procedure Laterality Date  . Irrigation and debridement of back abscess    . NASAL SEPTOPLASTY W/ TURBINOPLASTY [name] 02/03/2018   Procedure: NASAL SEPTOPLASTY WITH TURBINATE REDUCTION;  Surgeon: [name], MD;  Location: [place];  Service: ENT;   Laterality: [name];  . PILONIDAL CYST DRAINAGE [name] 04/10/2017   Procedure: IRRIGATION AND DEBRIDEMENT BACK ABSCESS;  Surgeon: [name], MD;  Location[facility];  Service: General;  Laterality: [name];  . SINUS ENDO WITH FUSION [name] 02/03/2018   Procedure: ENDOSCOPIC SINUS SURGERY WITH FUSION;  Surgeon: [name], MD;  Location: [facility];  Service: ENT;  Laterality: [name];  . THORACIC OUTLET SURGERY  2000  . WRIST FUSION  2002    Current Medications: Current Outpatient Medications on File Prior to Visit  Medication Sig  . albuterol (PROVENTIL HFA;VENTOLIN HFA) 108 (90 Base) MCG/ACT inhaler Inhale 2 puffs into the lungs every 6 (six) hours as needed for wheezing or shortness of breath.  [name] amLODipine (NORVASC) 2.5 MG tablet TAKE 1 TABLET (2.5 MG TOTAL) BY MOUTH DAILY.  [name] FLUoxetine (PROZAC) 20 MG capsule 1/2 tablet daily for one week then increase to one full tablet daily  . fluticasone (FLONASE) 50 MCG/ACT nasal spray Place 2 sprays into both nostrils daily.  . hydrochlorothiazide (HYDRODIURIL) 25 MG tablet Take 1 tablet (25 mg total) by mouth 2 (two) times daily.  [name] LYRICA 300 MG capsule TAKE 1 CAPSULE BY MOUTH TWICE A DAY  . modafinil (PROVIGIL) 100 MG tablet Take 1 tablet (100 mg total) by mouth daily.  . montelukast (SINGULAIR) 10 MG tablet Take 1 tablet (10 mg total) by mouth at bedtime.  . Nutritional  Supplements (JUICE PLUS FIBRE PO) Take 4 each by mouth daily with lunch.  . potassium chloride SA (K-DUR,KLOR-CON) 20 MEQ tablet Take 1 tablet (20 mEq total) by mouth daily.  . tapentadol HCl (NUCYNTA) 75 MG tablet Take 1 tablet (75 mg total) by mouth every 8 (eight) hours as needed.  . traMADol (ULTRAM) 50 MG tablet Take 2 mg by mouth every 6 (six) hours as needed.  . varenicline (CHANTIX PAK) 0.5 MG X 11 & 1 MG X 42 tablet Take one 0.5 mg tablet by mouth once daily for 3 days, then increase to one 0.5 mg tablet twice daily for 4 days, then increase to one 1 mg tablet twice daily.   No current  facility-administered medications on file prior to visit.      Allergies:   Pollen extract; Dust mite extract; and Other   Social History   Socioeconomic History  . Marital status: Divorced    Spouse name: Not on file  . Number of children: 2  . Years of education: 12th   . Highest education level: Not on file  Occupational History  . Occupation: [name]  Social Needs  . Financial resource strain: Not on file  . Food insecurity:    Worry: Not on file    Inability: Not on file  . Transportation needs:    Medical: Not on file    Non-medical: Not on file  Tobacco Use  . Smoking status: Current Every Day Smoker    Packs/day: 1.00    Years: 33.00    Pack years: 33.00    Types: Cigarettes  . Smokeless tobacco: Never Used  Substance and Sexual Activity  . Alcohol use: No    Alcohol/week: 0.0 standard drinks    Comment: Rarely  . Drug use: No  . Sexual activity: Yes    Birth control/protection: None  Lifestyle  . Physical activity:    Days per week: Not on file    Minutes per session: Not on file  . Stress: Not on file  Relationships  . Social connections:    Talks on phone: Not on file    Gets together: Not on file    Attends religious service: Not on file    Active member of club or organization: Not on file    Attends meetings of clubs or organizations: Not on file    Relationship status: Not on file  Other Topics Concern  . Not on file  Social History Narrative   Lives at home w/ his step-mother   Right-handed   Drinks about 2-3 Mt Dews a day  Drinks alcohol only occasionally, maybe 2 beers/month.   Family History: The patient's family history includes Cancer in his maternal grandmother; Diabetes in his mother; Heart disease in his father; Hypertension in his father and mother; Stroke in his paternal grandfather. There is no history of Neuropathy. Mother deceased age 64, father deceased age 60.  ROS:   Please see the history of present illness.  Additional  pertinent ROS: Review of Systems  Constitutional: Positive for malaise/fatigue. Negative for chills and fever.  HENT: Negative for ear pain and hearing loss.   Eyes: Positive for blurred vision. Negative for pain.  Respiratory: Positive for shortness of breath. Negative for cough, hemoptysis and sputum production.   Cardiovascular: Positive for leg swelling. Negative for chest pain, palpitations, orthopnea, claudication and PND.  Gastrointestinal: Negative for abdominal pain, blood in stool and melena.  Genitourinary: Positive for urgency. Negative for dysuria and hematuria.  Musculoskeletal: Positive for joint pain and myalgias. Negative for falls.  Skin: Negative for rash.  Neurological: Negative for focal weakness and loss of consciousness.  Endo/Heme/Allergies: Does not bruise/bleed easily.    EKGs/Labs/Other Studies Reviewed:    The following studies were reviewed today: Notes in chart  EKG:  EKG is ordered today.  The ekg ordered today demonstrates normal sinus rhythm  Recent Labs: 11/11/2017: TSH 0.836 02/03/2018: Hemoglobin 14.9; Platelets 170 04/07/2018: ALT 19; BUN 20; Creatinine, Ser 1.23; Potassium 3.5; Sodium 142  Recent Lipid Panel    Component Value Date/Time   CHOL 113 11/11/2017 1523   TRIG 176 (H) 11/11/2017 1523   HDL 25 (L) 11/11/2017 1523   CHOLHDL 4.5 11/11/2017 1523   CHOLHDL 4.5 10/25/2014 1124   VLDL 36 10/25/2014 1124   LDLCALC 53 11/11/2017 1523    Physical Exam:    VS:  BP 102/70   Pulse 80   Ht 6' 4.5" (1.943 m)   Wt (!) 335 lb 9.6 oz (152.2 kg)   BMI 40.32 kg/m     Wt Readings from Last 3 Encounters:  06/02/18 (!) 335 lb 9.6 oz (152.2 kg)  05/05/18 (!) 343 lb (155.6 kg)  04/07/18 (!) 357 lb (161.9 kg)     GEN: Well nourished, well developed in no acute distress HEENT: Normal NECK: No JVD; No carotid bruits LYMPHATICS: No lymphadenopathy CARDIAC: moderately distant heart sounds, regular rhythm, normal S1 and S2, no murmurs, rubs,  gallops. Radial and DP pulses 2+ bilaterally. RESPIRATORY:  Clear to auscultation without rales, wheezing or rhonchi  ABDOMEN: Soft, non-tender, non-distended MUSCULOSKELETAL:  No deformity  SKIN: Warm and dry. Multiple pockmark skin lesions on upper and lower extremities bilaterally. Bilateral feet with brawny nonpitting edema.  NEUROLOGIC:  Alert and oriented x 3, moves all limbs independently PSYCHIATRIC:  Blunted affect   ASSESSMENT:    1. Shortness of breath   2. Essential hypertension   3. OSA (obstructive sleep apnea)   4. TOBACCO ABUSE   5. Morbid obesity ([place])   6. Lower extremity edema   7. Tobacco abuse counseling   8. Counseling on health promotion and disease prevention    PLAN:    1. Shortness of breath: no JVD, no appreciable murmurs. Lungs are clear on exam. May have diastolic dysfunction or increased filling pressures given his sleep apnea. Will order echocardiogram to rule out cardiac pathology. If normal, suspect this may be obesity hypoventilation syndrome or similar.  2. LE edema: brawny, nonpitting. Good blood flow to extremities. Suspect venous insufficiency. He is planning to try compression stockings.  3. Cardiovascular risk factors:  Hypertension: well controlled on minimal medications Lipids: excellent LDL without medication. ASCVD risk can't accurately be calculated with his labs, but tobacco puts him at elevated risk. Continue to monitor  Tobacco: The patient was counseled on the dangers of tobacco use, and was advised to quit.  Reviewed strategies to maximize success, including removing cigarettes and smoking materials from environment and pharmacotherapy (using chantix). Obesity: he is pursuing bariatric surgery as a means for weight loss. Counseled on nutrition and exercise, though he is currently limited by his breathing. Obstructive sleep apnea: Epworth sleepiness scale= 23, high chance of dozing in every situation except talking to someone (moderate  chance). Checked the majority of symptom boxes. He has been evaluated for sleep apnea and told that he has it, but he has claustrophobia issues with the mask. He has nasal pillows and will try that once he gets the compatible  machine. I am hopeful that this will help his sleep patterns and fatigue, and it may help his breathing as well.  Plan for follow up: PRN  Medication Adjustments/Labs and Tests Ordered: Current medicines are reviewed at length with the patient today.  Concerns regarding medicines are outlined above.  Orders Placed This Encounter  Procedures  . EKG 12-Lead  . ECHOCARDIOGRAM COMPLETE   No orders of the defined types were placed in this encounter.   Patient Instructions  Medication Instructions: Your physician recommends that you continue on your current medications as directed.    If you need a refill on your cardiac medications before your next appointment, please call your pharmacy.   Labwork: None  Procedures/Testing: Your physician has requested that you have an echocardiogram. Echocardiography is a painless test that uses sound waves to create images of your heart. It provides your doctor with information about the size and shape of your heart and how well your heart's chambers and valves are working. This procedure takes approximately one hour. There are no restrictions for this procedure. [address] [place] [address]   Follow-Up: Your physician wants you to follow-up as needed with Dr. [name]. Call our office at [phone] to schedule this appointment.   Special Instructions:    Thank you for choosing Heartcare at [facility]!!       Signed, Buford Dresser, MD PhD 06/02/2018 4:44 PM    [facility]

## 2018-06-02 NOTE — Patient Instructions (Signed)
Medication Instructions: Your physician recommends that you continue on your current medications as directed.    If you need a refill on your cardiac medications before your next appointment, please call your pharmacy.   Labwork: None  Procedures/Testing: Your physician has requested that you have an echocardiogram. Echocardiography is a painless test that uses sound waves to create images of your heart. It provides your doctor with information about the size and shape of your heart and how well your heart's chambers and valves are working. This procedure takes approximately one hour. There are no restrictions for this procedure. [address]   Follow-Up: Your physician wants you to follow-up as needed with Dr. [name]. Call our office at [phone] to schedule this appointment.   Special Instructions:    Thank you for choosing Heartcare at [facility]!!

## 2018-06-07 ENCOUNTER — Encounter (INDEPENDENT_AMBULATORY_CARE_PROVIDER_SITE_OTHER): Payer: Self-pay | Admitting: Orthopedic Surgery

## 2018-06-07 ENCOUNTER — Telehealth: Payer: Self-pay

## 2018-06-07 DIAGNOSIS — M25572 Pain in left ankle and joints of left foot: Secondary | ICD-10-CM | POA: Diagnosis not present

## 2018-06-07 MED ORDER — LIDOCAINE HCL 1 % IJ SOLN
2.0000 mL | INTRAMUSCULAR | Status: AC | PRN
Start: 2018-06-07 — End: 2018-06-07
  Administered 2018-06-07: 2 mL

## 2018-06-07 MED ORDER — METHYLPREDNISOLONE ACETATE 40 MG/ML IJ SUSP
40.0000 mg | INTRAMUSCULAR | Status: AC | PRN
  Administered 2018-06-07: 40 mg via INTRA_ARTICULAR

## 2018-06-07 NOTE — Telephone Encounter (Signed)
Pt called requesting refill on Nucynta 75 mg last filled 05/11/18 next appt 06/21/18

## 2018-06-07 NOTE — Progress Notes (Addendum)
Office Visit Note   Patient: Eduardo [name]           Date of Birth: 03-Jan-1971           MRN: [id number] Visit Date: 05/24/2018              Requested by: [name], NP [place], [place] [phone] PCP: [name], NP  No chief complaint on file.     HPI: Patient is a 47 year old gentleman who was seen for left foot and ankle pain and referral from Dr. Erlinda Hong  Assessment & Plan: Visit Diagnoses:  1. Pain in left ankle and joints of left foot   2. Body mass index 40.0-44.9, adult (HCC)     Plan: Recommended a stiff soled shoe to unload the metatarsal heads recommended a arch support and Achilles stretching this was demonstrated.  Ankle was injected without complications.  Follow-Up Instructions: Return in about 4 weeks (around 06/21/2018).   Ortho Exam  Patient is alert, oriented, no adenopathy, well-dressed, normal affect, normal respiratory effort. Examination patient has good dorsalis pedis pulse on the left he has an antalgic gait he is short of breath while being seated he has clawing of the toes with Achilles contracture with dorsiflexion 10 degrees short of neutral with his knee extended there is tendinitis to palpation along the Achilles tendon he has brawny skin color changes with pitting edema with significant venous insufficiency.  He is tender to palpation anteriorly over the ankle.  He does have some neuropathic ulcers of the right great toe and right fourth toe.  Review of the MRI scan shows some arthritis of the talonavicular joint there is also some edema over the lateral ankle ligaments consistent with ankle impingement there is also some plantar fasciitis as well.  Imaging: No results found. No images are attached to the encounter.  Labs: Lab Results  Component Value Date   HGBA1C 4.6 (L) 11/11/2017   HGBA1C 4.8 05/09/2015   HGBA1C 4.8 10/25/2014   CRP 5.7 (H) 05/09/2015   LABURIC 7.1 03/08/2018   REPTSTATUS 04/15/2017 FINAL  04/10/2017   GRAMSTAIN  04/10/2017    ABUNDANT WBC PRESENT, PREDOMINANTLY PMN MODERATE GRAM POSITIVE COCCI IN CLUSTERS    CULT  04/10/2017    ABUNDANT METHICILLIN RESISTANT STAPHYLOCOCCUS AUREUS NO ANAEROBES ISOLATED Performed at [facility], [place] [address] [place], [place] [address]    LABORGA METHICILLIN RESISTANT STAPHYLOCOCCUS AUREUS 04/10/2017     Lab Results  Component Value Date   ALBUMIN 4.1 04/07/2018   ALBUMIN 4.9 11/11/2017   ALBUMIN 4.4 04/06/2017   LABURIC 7.1 03/08/2018    There is no height or weight on file to calculate BMI.  Orders:  No orders of the defined types were placed in this encounter.  No orders of the defined types were placed in this encounter.    Procedures: Medium Joint Inj: L ankle on 06/07/2018 2:29 PM Indications: pain and diagnostic evaluation Details: 22 G 1.5 in needle, anteromedial approach Medications: 2 mL lidocaine 1 %; 40 mg methylPREDNISolone acetate 40 MG/ML Outcome: tolerated well, no immediate complications Procedure, treatment alternatives, risks and benefits explained, specific risks discussed. Consent was given by the patient. Immediately prior to procedure a time out was called to verify the correct patient, procedure, equipment, support staff and site/side marked as required. Patient was prepped and draped in the usual sterile fashion.      Clinical Data: No additional findings.  ROS:  All other  systems negative, except as noted in the HPI. Review of Systems  Objective: Vital Signs: There were no vitals taken for this visit.  Specialty Comments:  No specialty comments available.  PMFS History: Patient Active Problem List   Diagnosis Date Noted  . Body mass index 40.0-44.9, adult ([place]) 04/19/2018  . Pain in left foot 04/19/2018  . Shortness of breath 04/07/2018  . Lower extremity edema 04/07/2018  . Stasis dermatitis of both legs 04/07/2018  . Hypokalemia 04/07/2018  . Healthcare maintenance  04/07/2018  . Great toe pain, left 03/08/2018  . Acute gout involving toe of left foot 03/08/2018  . Low libido 02/09/2018  . OSA (obstructive sleep apnea) 02/03/2018  . Status post nasal septoplasty 02/03/2018  . Deviated septum 11/11/2017  . Umbilical hernia without obstruction and without gangrene 11/11/2017  . Scrotal pain 11/11/2017  . Scrotal cyst 11/11/2017  . Screening, lipid 11/11/2017  . Screening for diabetes mellitus (DM) 11/11/2017  . Screening for thyroid disorder 11/11/2017  . Anxiety and depression 08/11/2017  . Acute maxillary sinusitis 08/11/2017  . 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 Lumbar degenerative disc disease 08/02/2017  . Acute left ankle pain 07/12/2017  . Skin infection 06/03/2017  . Chronic folliculitis 54/27/0623  . [place] 04/10/2017  . Cellulitis and abscess of trunk s/p I&D 04/10/2017 04/06/2017  . Fever chills 04/06/2017  . History of MRSA infection 04/06/2017  . Physical exam, annual 03/03/2017  . Neuropathy 09/28/2015  . Major depression 12/26/2012  . Generalized anxiety disorder 12/26/2012  . Opiate dependence ([place]) 12/24/2012  . Benzodiazepine dependence ([place]) 12/24/2012  . HTN (hypertension) 12/24/2012  . 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 Morbid obesity ([place]) 09/09/2010  . TOBACCO ABUSE 09/09/2010  . LOW BACK PAIN, CHRONIC 09/09/2010   Past Medical History:  Diagnosis Date  . Anxiety   . Chronic back pain   . Degenerative disk disease   . Depression   . DVT of upper extremity (deep vein thrombosis) ([place])   . GERD (gastroesophageal reflux disease)   . Hypertension   . MRSA (methicillin resistant staph aureus) culture positive   . Peripheral neuropathy   . Pilonidal cyst   . Pulmonary embolus ([place])    no blood thinner 2002  . Sleep apnea    can't use cpap due to deviated nasal septumg    Family History  Problem Relation Age of Onset  . Cancer Maternal Grandmother        breast, ovarian & colon  .  Hypertension Mother   . Diabetes Mother   . Hypertension Father   . Heart disease Father   . Stroke Paternal Grandfather   . Neuropathy Neg Hx     Past Surgical History:  Procedure Laterality Date  . Irrigation and debridement of back abscess    . NASAL SEPTOPLASTY W/ TURBINOPLASTY [name] 02/03/2018   Procedure: NASAL SEPTOPLASTY WITH TURBINATE REDUCTION;  Surgeon: [name], MD;  Location: [place];  Service: ENT;  Laterality: [name];  . PILONIDAL CYST DRAINAGE [name] 04/10/2017   Procedure: IRRIGATION AND DEBRIDEMENT BACK ABSCESS;  Surgeon: [name], MD;  Location[facility];  Service: General;  Laterality: [name];  . SINUS ENDO WITH FUSION [name] 02/03/2018   Procedure: ENDOSCOPIC SINUS SURGERY WITH FUSION;  Surgeon: [name], MD;  Location: [place];  Service: ENT;  Laterality: [name];  . THORACIC OUTLET SURGERY  2000  . WRIST FUSION  2002   Social History   Occupational History  . Occupation: [name]  Tobacco Use  .  Smoking status: Current Every Day Smoker    Packs/day: 1.00    Years: 33.00    Pack years: 33.00    Types: Cigarettes  . Smokeless tobacco: Never Used  Substance and Sexual Activity  . Alcohol use: No    Alcohol/week: 0.0 standard drinks    Comment: Rarely  . Drug use: No  . Sexual activity: Yes    Birth control/protection: None

## 2018-06-08 ENCOUNTER — Encounter: Payer: Self-pay | Admitting: Registered Nurse

## 2018-06-08 ENCOUNTER — Encounter: Payer: BC Managed Care – PPO | Attending: Physical Medicine & Rehabilitation | Admitting: Registered Nurse

## 2018-06-08 VITALS — BP 121/80 | HR 77 | Ht 76.0 in | Wt 326.0 lb

## 2018-06-08 DIAGNOSIS — G608 Other hereditary and idiopathic neuropathies: Secondary | ICD-10-CM | POA: Diagnosis not present

## 2018-06-08 DIAGNOSIS — M76822 Posterior tibial tendinitis, left leg: Secondary | ICD-10-CM

## 2018-06-08 DIAGNOSIS — G894 Chronic pain syndrome: Secondary | ICD-10-CM | POA: Diagnosis not present

## 2018-06-08 DIAGNOSIS — M79671 Pain in right foot: Secondary | ICD-10-CM | POA: Insufficient documentation

## 2018-06-08 DIAGNOSIS — G609 Hereditary and idiopathic neuropathy, unspecified: Secondary | ICD-10-CM

## 2018-06-08 DIAGNOSIS — Z5181 Encounter for therapeutic drug level monitoring: Secondary | ICD-10-CM | POA: Diagnosis not present

## 2018-06-08 DIAGNOSIS — M21611 Bunion of right foot: Secondary | ICD-10-CM | POA: Insufficient documentation

## 2018-06-08 DIAGNOSIS — Z79899 Other long term (current) drug therapy: Secondary | ICD-10-CM

## 2018-06-08 DIAGNOSIS — M5136 Other intervertebral disc degeneration, lumbar region: Secondary | ICD-10-CM

## 2018-06-08 DIAGNOSIS — M5416 Radiculopathy, lumbar region: Secondary | ICD-10-CM

## 2018-06-08 DIAGNOSIS — M79672 Pain in left foot: Secondary | ICD-10-CM | POA: Diagnosis not present

## 2018-06-08 DIAGNOSIS — M21612 Bunion of left foot: Secondary | ICD-10-CM | POA: Insufficient documentation

## 2018-06-08 MED ORDER — TAPENTADOL HCL 75 MG PO TABS: 75.0000 mg | ORAL_TABLET | Freq: Four times a day (QID) | ORAL | 0 refills | Status: DC | PRN

## 2018-06-08 NOTE — Progress Notes (Signed)
Subjective:    Patient ID: Eduardo [name], male    DOB: [birth year], 47 y.o.   MRN: [id number]  HPI: [name] is a 47 year old male who returns for follow up appointment for chronic pain and medication refill. He states his pain is located in his lower back radiating into his bilateral hips, bilateral lower extremities and bilateral feet with tingling and numbness. He rates his pain 9. His current exercise regime is walking.   Spoke with Dr. Letta Pate regarding Tramadol, the Tramadol should be discontinued. This provider placed a call to Mr. Febles, he verbalizes understanding.   Morphine Equivalent is 120.00 MME. Last UDS was Performed on 12/17/2017, it was consistent.   Pain Inventory Average Pain 8 Pain Right Now 9 My pain is constant, sharp, burning and stabbing  In the last 24 hours, has pain interfered with the following? General activity 9 Relation with others 8 Enjoyment of life 10 What TIME of day is your pain at its worst? all Sleep (in general) Fair  Pain is worse with: walking and standing Pain improves with: rest and medication Relief from Meds: 8  Mobility walk without assistance ability to climb steps?  yes do you drive?  yes  Function employed # of hrs/week 40  Neuro/Psych bladder control problems weakness numbness tingling dizziness depression anxiety  Prior Studies Any changes since last visit?  no  Physicians involved in your care Any changes since last visit?  no   Family History  Problem Relation Age of Onset  . Cancer Maternal Grandmother        breast, ovarian & colon  . Hypertension Mother   . Diabetes Mother   . Hypertension Father   . Heart disease Father   . Stroke Paternal Grandfather   . Neuropathy Neg Hx    Social History   Socioeconomic History  . Marital status: Divorced    Spouse name: Not on file  . Number of children: 2  . Years of education: 12th   . Highest education level: Not on file    Occupational History  . Occupation: [name]  Social Needs  . Financial resource strain: Not on file  . Food insecurity:    Worry: Not on file    Inability: Not on file  . Transportation needs:    Medical: Not on file    Non-medical: Not on file  Tobacco Use  . Smoking status: Current Every Day Smoker    Packs/day: 1.00    Years: 33.00    Pack years: 33.00    Types: Cigarettes  . Smokeless tobacco: Never Used  Substance and Sexual Activity  . Alcohol use: No    Alcohol/week: 0.0 standard drinks    Comment: Rarely  . Drug use: No  . Sexual activity: Yes    Birth control/protection: None  Lifestyle  . Physical activity:    Days per week: Not on file    Minutes per session: Not on file  . Stress: Not on file  Relationships  . Social connections:    Talks on phone: Not on file    Gets together: Not on file    Attends religious service: Not on file    Active member of club or organization: Not on file    Attends meetings of clubs or organizations: Not on file    Relationship status: Not on file  Other Topics Concern  . Not on file  Social History Narrative   Lives at home w/  his step-mother   Right-handed   Drinks about 2-3 Mt Dews a day   Past Surgical History:  Procedure Laterality Date  . Irrigation and debridement of back abscess    . NASAL SEPTOPLASTY W/ TURBINOPLASTY [name] 02/03/2018   Procedure: NASAL SEPTOPLASTY WITH TURBINATE REDUCTION;  Surgeon: [name], MD;  Location: [facility];  Service: ENT;  Laterality: [name];  . PILONIDAL CYST DRAINAGE [name] 04/10/2017   Procedure: IRRIGATION AND DEBRIDEMENT BACK ABSCESS;  Surgeon: [name], MD;  Location[facility];  Service: General;  Laterality: [name];  . SINUS ENDO WITH FUSION [name] 02/03/2018   Procedure: ENDOSCOPIC SINUS SURGERY WITH FUSION;  Surgeon: [name], MD;  Location: [facility];  Service: ENT;  Laterality: [name];  . THORACIC OUTLET SURGERY  2000  . WRIST FUSION  2002   Past Medical History:  Diagnosis Date  . Anxiety    . Chronic back pain   . Degenerative disk disease   . Depression   . DVT of upper extremity (deep vein thrombosis) ([place])   . GERD (gastroesophageal reflux disease)   . Hypertension   . MRSA (methicillin resistant staph aureus) culture positive   . Peripheral neuropathy   . Pilonidal cyst   . Pulmonary embolus ([place])    no blood thinner 2002  . Sleep apnea    can't use cpap due to deviated nasal septumg   BP 121/80   Pulse 77   Ht 6\' 4"  (1.93 m)   Wt (!) 326 lb (147.9 kg)   SpO2 97%   BMI 39.68 kg/m   Opioid Risk Score:   Fall Risk Score:  `1  Depression screen PHQ 2/9  Depression screen [facility] 2/9 04/07/2018 03/08/2018 02/09/2018 11/11/2017 10/14/2017 08/11/2017 08/02/2017  Decreased Interest 1 1 2 1 1 2 2   Down, Depressed, Hopeless 1 1 0 1 1 2 3   PHQ - 2 Score 2 2 2 2 2 4 5   Altered sleeping 3 2 2 1 3 3 3   Tired, decreased energy 3 3 2 1 2 3 3   Change in appetite 2 2 2 2 1 3 3   Feeling bad or failure about yourself  1 0 0 1 1 2 2   Trouble concentrating 3 2 3 2 2 3 3   Moving slowly or fidgety/restless 3 1 2 2 2 2 2   Suicidal thoughts 0 0 0 0 0 0 1  PHQ-9 Score 17 12 13 11 13 20 22   Difficult doing work/chores Very difficult Very difficult Somewhat difficult Somewhat difficult Very difficult Very difficult Extremely dIfficult  Some recent data might be hidden     Review of Systems  Constitutional: Positive for unexpected weight change.  HENT: Negative.   Eyes: Negative.   Respiratory: Positive for apnea and shortness of breath.   Cardiovascular: Negative.   Gastrointestinal: Negative.   Endocrine: Negative.   Genitourinary: Positive for difficulty urinating.  Musculoskeletal: Positive for arthralgias and myalgias.  Skin: Negative.   Allergic/Immunologic: Negative.   Neurological: Positive for dizziness, weakness and numbness.  Hematological: Negative.   Psychiatric/Behavioral: Positive for dysphoric mood. The patient is nervous/anxious.   All other systems reviewed  and are negative.      Objective:   Physical Exam  Constitutional: He is oriented to person, place, and time. He appears well-developed and well-nourished.  HENT:  Head: Normocephalic and atraumatic.  Neck: Normal range of motion. Neck supple.  Cardiovascular: Normal rate and regular rhythm.  Pulmonary/Chest: Effort normal and breath sounds normal.  Musculoskeletal:  Normal  Muscle Bulk and Muscle Testing Reveals: Upper Extremities: Full ROM and Muscle Strength 5/5 Back without spinal tenderness noted Lower Extremities: Full ROM and Muscle Strength 5/5 Arises from chair with ease Narrow based gait  Neurological: He is alert and oriented to person, place, and time.  Skin: Skin is warm and dry.  Psychiatric: He has a normal mood and affect. His behavior is normal.  Nursing note and vitals reviewed.         Assessment & Plan:  1. Chronic idiopathic axonal polyneuropathy: Continue Lyrica 2. Posterior tibial tendinitis of left lower extremity: Continue HEP as Tolerated. Continue to Monitor.  3. Lumbar DDD/ Lumbar Radiculitis: Continue current medication regime and HEP as Tolerated.  4. Chronic Pain Syndrome: Refilled: Nucynta 75 mg one tablet every 6 hours as needed #120. Tramadol discontinued.  We will continue the opioid monitoring program, this consists of regular clinic visits, examinations, urine drug screen, pill counts as well as use of [place] Controlled Substance Reporting system.  30 minutes of face to face patient care time was spent during this visit. All questions were encouraged and answered.  F/U in 1 month

## 2018-06-08 NOTE — Telephone Encounter (Signed)
Eduardo [name] was called on 06/07/2018 and scheduled for appointment on 06/08/2018.

## 2018-06-13 ENCOUNTER — Other Ambulatory Visit: Payer: Self-pay | Admitting: Adult Health

## 2018-06-13 ENCOUNTER — Ambulatory Visit (HOSPITAL_COMMUNITY): Payer: BC Managed Care – PPO | Attending: Cardiology

## 2018-06-13 ENCOUNTER — Telehealth: Payer: Self-pay

## 2018-06-13 ENCOUNTER — Other Ambulatory Visit: Payer: Self-pay

## 2018-06-13 DIAGNOSIS — I82409 Acute embolism and thrombosis of unspecified deep veins of unspecified lower extremity: Secondary | ICD-10-CM | POA: Insufficient documentation

## 2018-06-13 DIAGNOSIS — G4730 Sleep apnea, unspecified: Secondary | ICD-10-CM | POA: Diagnosis not present

## 2018-06-13 DIAGNOSIS — Z720 Tobacco use: Secondary | ICD-10-CM | POA: Insufficient documentation

## 2018-06-13 DIAGNOSIS — Z6839 Body mass index (BMI) 39.0-39.9, adult: Secondary | ICD-10-CM | POA: Insufficient documentation

## 2018-06-13 DIAGNOSIS — R600 Localized edema: Secondary | ICD-10-CM | POA: Diagnosis not present

## 2018-06-13 DIAGNOSIS — R0602 Shortness of breath: Secondary | ICD-10-CM | POA: Insufficient documentation

## 2018-06-13 MED ORDER — VARENICLINE TARTRATE 1 MG PO TABS: 1.0000 mg | ORAL_TABLET | Freq: Two times a day (BID) | ORAL | 2 refills | Status: DC

## 2018-06-13 NOTE — Telephone Encounter (Signed)
Good Afternoon Tonya, Can you please call Mr. Ambrosini and share- I sent in rx for Chantix 1mg  BID - 60 count with two refills This should be final 3 month course Thanks! Valetta Fuller

## 2018-06-13 NOTE — Telephone Encounter (Signed)
Pt states that he is taking 1mg  BID and it is working pretty good for him.  Charyl Bigger, CMA

## 2018-06-13 NOTE — Telephone Encounter (Signed)
-----   Message from [name], NP sent at 06/13/2018  3:06 PM EDT ----- Brand Males, I refilled Mr. Nevares antidepressant Can you please call him and verify his Chantix dosage- he should be up to 1mg  BID Thanks! Valetta Fuller

## 2018-06-13 NOTE — Telephone Encounter (Signed)
Needs new RX for Chantix continuation therapy.  Charyl Bigger, CMA

## 2018-06-13 NOTE — Telephone Encounter (Signed)
LVM informing pt.  T. Jonah Gingras, CMA 

## 2018-06-13 NOTE — Telephone Encounter (Signed)
LVM for pt to call to discuss dosing of Chantix.  Charyl Bigger, CMA

## 2018-06-21 ENCOUNTER — Ambulatory Visit (INDEPENDENT_AMBULATORY_CARE_PROVIDER_SITE_OTHER): Payer: BC Managed Care – PPO | Admitting: Orthopedic Surgery

## 2018-06-21 ENCOUNTER — Ambulatory Visit: Payer: Self-pay | Admitting: Registered Nurse

## 2018-07-01 ENCOUNTER — Telehealth: Payer: Self-pay

## 2018-07-01 MED ORDER — TAPENTADOL HCL 100 MG PO TABS: 100.0000 mg | ORAL_TABLET | Freq: Four times a day (QID) | ORAL | 0 refills | Status: DC | PRN

## 2018-07-01 NOTE — Telephone Encounter (Signed)
Placed order for Nucynta 100mg  QID

## 2018-07-01 NOTE — Telephone Encounter (Signed)
Patient called, states the tapendatol at its current dosing and strength are not working and he is still currently in pain

## 2018-07-04 NOTE — Telephone Encounter (Signed)
Notified Mr Marohl.

## 2018-07-05 ENCOUNTER — Encounter (INDEPENDENT_AMBULATORY_CARE_PROVIDER_SITE_OTHER): Payer: Self-pay | Admitting: Orthopedic Surgery

## 2018-07-05 ENCOUNTER — Ambulatory Visit (INDEPENDENT_AMBULATORY_CARE_PROVIDER_SITE_OTHER): Payer: BC Managed Care – PPO | Admitting: Orthopedic Surgery

## 2018-07-05 VITALS — Ht 76.0 in | Wt 326.0 lb

## 2018-07-05 DIAGNOSIS — M6701 Short Achilles tendon (acquired), right ankle: Secondary | ICD-10-CM | POA: Diagnosis not present

## 2018-07-05 DIAGNOSIS — M25572 Pain in left ankle and joints of left foot: Secondary | ICD-10-CM | POA: Diagnosis not present

## 2018-07-05 DIAGNOSIS — I87323 Chronic venous hypertension (idiopathic) with inflammation of bilateral lower extremity: Secondary | ICD-10-CM | POA: Diagnosis not present

## 2018-07-05 DIAGNOSIS — M25472 Effusion, left ankle: Secondary | ICD-10-CM | POA: Diagnosis not present

## 2018-07-05 DIAGNOSIS — M6702 Short Achilles tendon (acquired), left ankle: Secondary | ICD-10-CM

## 2018-07-05 MED ORDER — SILVER SULFADIAZINE 1 % EX CREA: 1.0000 "application " | TOPICAL_CREAM | Freq: Every day | CUTANEOUS | 0 refills | Status: DC

## 2018-07-05 NOTE — Progress Notes (Signed)
Office Visit Note   Patient: Eduardo [name]           Date of Birth: [birth year]           MRN: [id number] Visit Date: 07/05/2018              Requested by: [name], NP [address] [place], [place] [address] PCP: [name], NP  Chief Complaint  Patient presents with  . Left Foot - Follow-up, Pain  . Right Foot - Follow-up, Pain    Right Big Toe       HPI: Patient is a 47 year old gentleman is seen in follow-up for both lower extremities.  Patient has had Achilles contracture he states he is been doing heel cord stretching.  He wears work boots he states he works every day standing on his feet.  He has a insensate neuropathic ulcer beneath the great toe on the right fourth toe on the right.  Patient complains of pain also over the forefoot on the left.  States his pain at times is 9/10.  He does go to pain management is currently on Lyrica and Nucynta.  He states the left ankle joint injection did not help only worked about 2 days.  Assessment & Plan: Visit Diagnoses:  1. Pain in left ankle and joints of left foot   2. Effusion of left ankle     Plan: Discussed that we do need to get the venous stasis swelling down in his legs recommended compression stockings he will continue with Achilles stretching discussed the importance of a stiff soled shoe to unload the forefoot pressure.  I feel that patient has been unsuccessful with his Achilles stretching and will need to proceed with gastrocnemius recession once the venous stasis swelling has resolved.  Patient will continue with dressing changes of the great toe and fourth toe on the right a prescription was called in for Silvadene.  I feel that once we can improve the dorsiflexion of his ankles the forefoot pain should resolve.  Patient's tenderness and pain in the left ankle could be and improve with arthroscopic debridement of the impingement.  Follow-Up Instructions: Return in about 1 week (around 07/12/2018).    Ortho Exam  Patient is alert, oriented, no adenopathy, well-dressed, normal affect, normal respiratory effort. Examination patient has good pulses he has brawny skin color changes of both legs he is very small petechial ulcers on both legs with venous insufficiency.  He has severe Achilles contracture with dorsiflexion about 20 degrees short of neutral with his knee extended.  He has pain to palpation of the left forefoot from the Achilles contracture he has pain to palpation anteriorly over the left ankle as well.  Right foot he has a possibly 75% good granulation tissue the great toe ulcer which is 2 cm diameter 1 mm deep and he has a pressure ulcer on the fourth toe which is about 5 mm in diameter 1 mm deep there is no sausage digit swelling no signs of osteomyelitis.  Imaging: No results found. No images are attached to the encounter.  Labs: Lab Results  Component Value Date   HGBA1C 4.6 (L) 11/11/2017   HGBA1C 4.8 05/09/2015   HGBA1C 4.8 10/25/2014   CRP 5.7 (H) 05/09/2015   LABURIC 7.1 03/08/2018   REPTSTATUS 04/15/2017 FINAL 04/10/2017   GRAMSTAIN  04/10/2017    ABUNDANT WBC PRESENT, PREDOMINANTLY PMN MODERATE GRAM POSITIVE COCCI IN CLUSTERS    CULT  04/10/2017  ABUNDANT METHICILLIN RESISTANT STAPHYLOCOCCUS AUREUS NO ANAEROBES ISOLATED Performed at [facility], [place] [address], [place], [place] [address]    LABORGA METHICILLIN RESISTANT STAPHYLOCOCCUS AUREUS 04/10/2017     Lab Results  Component Value Date   ALBUMIN 4.1 04/07/2018   ALBUMIN 4.9 11/11/2017   ALBUMIN 4.4 04/06/2017   LABURIC 7.1 03/08/2018    Body mass index is 39.68 kg/m.  Orders:  No orders of the defined types were placed in this encounter.  Meds ordered this encounter  Medications  . silver sulfADIAZINE (SILVADENE) 1 % cream    Sig: Apply 1 application topically daily.    Dispense:  50 g    Refill:  0     Procedures: No procedures performed  Clinical Data: No additional  findings.  ROS:  All other systems negative, except as noted in the HPI. Review of Systems  Objective: Vital Signs: Ht 6\' 4"  (1.93 m)   Wt (!) 326 lb (147.9 kg)   BMI 39.68 kg/m   Specialty Comments:  No specialty comments available.  PMFS History: Patient Active Problem List   Diagnosis Date Noted  . Body mass index 40.0-44.9, adult ([place]) 04/19/2018  . Pain in left foot 04/19/2018  . Shortness of breath 04/07/2018  . Lower extremity edema 04/07/2018  . Stasis dermatitis of both legs 04/07/2018  . Hypokalemia 04/07/2018  . Healthcare maintenance 04/07/2018  . Great toe pain, left 03/08/2018  . Acute gout involving toe of left foot 03/08/2018  . Low libido 02/09/2018  . OSA (obstructive sleep apnea) 02/03/2018  . Status post nasal septoplasty 02/03/2018  . Deviated septum 11/11/2017  . Umbilical hernia without obstruction and without gangrene 11/11/2017  . Scrotal pain 11/11/2017  . Scrotal cyst 11/11/2017  . Screening, lipid 11/11/2017  . Screening for diabetes mellitus (DM) 11/11/2017  . Screening for thyroid disorder 11/11/2017  . Anxiety and depression 08/11/2017  . Acute maxillary sinusitis 08/11/2017  . 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 Lumbar degenerative disc disease 08/02/2017  . Acute left ankle pain 07/12/2017  . Skin infection 06/03/2017  . Chronic folliculitis 87/56/4332  . [place] 04/10/2017  . Cellulitis and abscess of trunk s/p I&D 04/10/2017 04/06/2017  . Fever chills 04/06/2017  . History of MRSA infection 04/06/2017  . Physical exam, annual 03/03/2017  . Neuropathy 09/28/2015  . Major depression 12/26/2012  . Generalized anxiety disorder 12/26/2012  . Opiate dependence ([place]) 12/24/2012  . Benzodiazepine dependence ([place]) 12/24/2012  . HTN (hypertension) 12/24/2012  . 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 Morbid obesity ([place]) 09/09/2010  . TOBACCO ABUSE 09/09/2010  . LOW BACK PAIN, CHRONIC 09/09/2010   Past Medical  History:  Diagnosis Date  . Anxiety   . Chronic back pain   . Degenerative disk disease   . Depression   . DVT of upper extremity (deep vein thrombosis) ([place])   . GERD (gastroesophageal reflux disease)   . Hypertension   . MRSA (methicillin resistant staph aureus) culture positive   . Peripheral neuropathy   . Pilonidal cyst   . Pulmonary embolus ([place])    no blood thinner 2002  . Sleep apnea    can't use cpap due to deviated nasal septumg    Family History  Problem Relation Age of Onset  . Cancer Maternal Grandmother        breast, ovarian & colon  . Hypertension Mother   . Diabetes Mother   . Hypertension Father   . Heart disease Father   .  Stroke Paternal Grandfather   . Neuropathy Neg Hx     Past Surgical History:  Procedure Laterality Date  . Irrigation and debridement of back abscess    . NASAL SEPTOPLASTY W/ TURBINOPLASTY [name] 02/03/2018   Procedure: NASAL SEPTOPLASTY WITH TURBINATE REDUCTION;  Surgeon: [name], MD;  Location: [facility];  Service: ENT;  Laterality: [name];  . PILONIDAL CYST DRAINAGE [name] 04/10/2017   Procedure: IRRIGATION AND DEBRIDEMENT BACK ABSCESS;  Surgeon: [name], MD;  [facility];  Service: General;  Laterality: [name];  . SINUS ENDO WITH FUSION [name] 02/03/2018   Procedure: ENDOSCOPIC SINUS SURGERY WITH FUSION;  Surgeon: [name], MD;  Location: [facility];  Service: ENT;  Laterality: [name];  . THORACIC OUTLET SURGERY  2000  . WRIST FUSION  2002   Social History   Occupational History  . Occupation: [name]  Tobacco Use  . Smoking status: Current Every Day Smoker    Packs/day: 1.00    Years: 33.00    Pack years: 33.00    Types: Cigarettes  . Smokeless tobacco: Never Used  Substance and Sexual Activity  . Alcohol use: No    Alcohol/week: 0.0 standard drinks    Comment: Rarely  . Drug use: No  . Sexual activity: Yes    Birth control/protection: None

## 2018-07-06 ENCOUNTER — Ambulatory Visit (INDEPENDENT_AMBULATORY_CARE_PROVIDER_SITE_OTHER): Payer: BC Managed Care – PPO | Admitting: Primary Care

## 2018-07-06 ENCOUNTER — Encounter: Payer: BC Managed Care – PPO | Admitting: Registered Nurse

## 2018-07-06 ENCOUNTER — Telehealth: Payer: Self-pay

## 2018-07-06 ENCOUNTER — Encounter: Payer: Self-pay | Admitting: Primary Care

## 2018-07-06 VITALS — BP 128/70 | HR 89 | Ht 76.0 in | Wt 334.8 lb

## 2018-07-06 DIAGNOSIS — G4733 Obstructive sleep apnea (adult) (pediatric): Secondary | ICD-10-CM | POA: Diagnosis not present

## 2018-07-06 DIAGNOSIS — F17200 Nicotine dependence, unspecified, uncomplicated: Secondary | ICD-10-CM

## 2018-07-06 NOTE — Progress Notes (Signed)
@Patient  ID: Eduardo [name], male    DOB: 1971/08/03, 47 y.o.   MRN: [id number]  Chief Complaint  Patient presents with  . Follow-up    OSA/CPAP FU    Referring provider: [name], NP  HPI: 47 year old male, current smoker (<1ppd). PMH severe obstructive sleep apnea, hypertension, obesity. Patient of Dr. Elsworth Soho, seen for initial consult on 07/18 . Split night sleep study on 11/2017 showed AHI 83/hr. Not currently wearing CPAP. Reports claustrophobia, unable to tolerate full face mask. Ordered to complete desensitization study.  07/07/2018 Patient presents today for 2 month fu. He reports that he completed desensitization at [facility]. He still does not have cpap machine and has not been using. No report available, states he tried on nasal pillow mask and is willing to try at home. Patient is still smoking, reports less than 1ppd. On chantix, asking if ok to use nicotine patch.    Allergies  Allergen Reactions  . Pollen Extract Cough  . Dust Mite Extract   . Other     Ragweed    Immunization History  Administered Date(s) Administered  . Tdap 07/30/2017    Past Medical History:  Diagnosis Date  . Anxiety   . Chronic back pain   . Degenerative disk disease   . Depression   . DVT of upper extremity (deep vein thrombosis) ([place])   . GERD (gastroesophageal reflux disease)   . Hypertension   . MRSA (methicillin resistant staph aureus) culture positive   . Peripheral neuropathy   . Pilonidal cyst   . Pulmonary embolus ([place])    no blood thinner 2002  . Sleep apnea    can't use cpap due to deviated nasal septumg    Tobacco History: Social History   Tobacco Use  Smoking Status Current Every Day Smoker  . Packs/day: 1.00  . Years: 33.00  . Pack years: 33.00  . Types: Cigarettes  Smokeless Tobacco Never Used   Ready to quit: Not Answered Counseling given: Not Answered   Outpatient Medications Prior to Visit  Medication Sig Dispense Refill  .  albuterol (PROVENTIL HFA;VENTOLIN HFA) 108 (90 Base) MCG/ACT inhaler Inhale 2 puffs into the lungs every 6 (six) hours as needed for wheezing or shortness of breath. 1 Inhaler 2  . amLODipine (NORVASC) 2.5 MG tablet TAKE 1 TABLET (2.5 MG TOTAL) BY MOUTH DAILY. 90 tablet 2  . FLUoxetine (PROZAC) 20 MG capsule TAKE 1 CAPSULE BY MOUTH DAILY AFTER COMPLETION FIRST WEEK OF FLUOXETINE 10MG  90 capsule 2  . fluticasone (FLONASE) 50 MCG/ACT nasal spray Place 2 sprays into both nostrils daily. 16 g 6  . hydrochlorothiazide (HYDRODIURIL) 25 MG tablet Take 1 tablet (25 mg total) by mouth 2 (two) times daily. 90 tablet 1  . LYRICA 300 MG capsule TAKE 1 CAPSULE BY MOUTH TWICE A DAY 60 capsule 1  . montelukast (SINGULAIR) 10 MG tablet Take 1 tablet (10 mg total) by mouth at bedtime. 90 tablet 3  . Nutritional Supplements (JUICE PLUS FIBRE PO) Take 4 each by mouth daily with lunch.    . potassium chloride SA (K-DUR,KLOR-CON) 20 MEQ tablet Take 1 tablet (20 mEq total) by mouth daily. 90 tablet 2  . silver sulfADIAZINE (SILVADENE) 1 % cream Apply 1 application topically daily. 50 g 0  . Tapentadol HCl (NUCYNTA) 100 MG TABS Take 1 tablet (100 mg total) by mouth 4 (four) times daily as needed. 120 tablet 0  . varenicline (CHANTIX) 1 MG tablet Take  1 tablet (1 mg total) by mouth 2 (two) times daily. 60 tablet 2  . modafinil (PROVIGIL) 100 MG tablet Take 1 tablet (100 mg total) by mouth daily. 30 tablet 2   No facility-administered medications prior to visit.     Review of Systems  Review of Systems  Constitutional: Negative.   Respiratory: Negative.   Cardiovascular: Negative.   Psychiatric/Behavioral: Positive for sleep disturbance.    Physical Exam  BP 128/70 (BP Location: Right Arm, Cuff Size: Large)   Pulse 89   Ht 6\' 4"  (1.93 m)   Wt (!) 334 lb 12.8 oz (151.9 kg)   SpO2 99%   BMI 40.75 kg/m  Physical Exam  Constitutional: He is oriented to person, place, and time. He appears well-developed and  well-nourished.  Overweight   HENT:  Head: Normocephalic and atraumatic.  Mallampati class II-III  Eyes: Pupils are equal, round, and reactive to light. EOM are normal.  Neck: Normal range of motion. Neck supple.  Cardiovascular: Normal rate, regular rhythm, normal heart sounds and intact distal pulses.  Pulmonary/Chest: Effort normal and breath sounds normal. No respiratory distress. He has no wheezes.  Abdominal: Soft. Bowel sounds are normal. There is no tenderness.  Neurological: He is alert and oriented to person, place, and time.  Skin: Skin is warm and dry. No rash noted. No erythema.  Psychiatric: He has a normal mood and affect. His behavior is normal. Judgment normal.     Lab Results:  CBC    Component Value Date/Time   WBC 12.5 (H) 02/03/2018 1124   RBC 4.57 02/03/2018 1124   HGB 14.9 02/03/2018 1124   HGB 15.4 11/11/2017 1523   HCT 41.4 02/03/2018 1124   HCT 43.8 11/11/2017 1523   PLT 170 02/03/2018 1124   PLT 175 11/11/2017 1523   MCV 90.6 02/03/2018 1124   MCV 93 11/11/2017 1523   MCH 32.6 02/03/2018 1124   MCHC 36.0 02/03/2018 1124   RDW 15.5 02/03/2018 1124   RDW 14.7 11/11/2017 1523   LYMPHSABS 2.1 11/11/2017 1523   MONOABS 1.0 04/10/2017 1416   EOSABS 0.2 11/11/2017 1523   BASOSABS 0.1 11/11/2017 1523    BMET    Component Value Date/Time   NA 142 04/07/2018 1700   K 3.5 04/07/2018 1700   CL 99 04/07/2018 1700   CO2 29 04/07/2018 1700   GLUCOSE 100 (H) 04/07/2018 1700   GLUCOSE 109 (H) 01/31/2018 1554   BUN 20 04/07/2018 1700   CREATININE 1.23 04/07/2018 1700   CREATININE 1.12 10/25/2014 1124   CALCIUM 9.3 04/07/2018 1700   GFRNONAA 69 04/07/2018 1700   GFRNONAA 79 10/25/2014 1124   GFRAA 79 04/07/2018 1700   GFRAA >89 10/25/2014 1124    BNP No results found for: BNP  ProBNP No results found for: PROBNP  Imaging: No results found.   Assessment & Plan:   OSA (obstructive sleep apnea) - Completed desensitization study at [facility] new CPAP start, auto titrate 5-15cm H20 with nasal pillow mask, chin strap, humidification and supplies - FU in 6 weeks with download   TOBACCO ABUSE Strongly encourage smoking cessation >5 mins  Still smoking <1 ppd On chantix for the last 2 months Ok to use nicotine patches       Martyn Ehrich, NP 07/07/2018

## 2018-07-06 NOTE — Telephone Encounter (Signed)
Pt called stating insurance will pay for #60 for Nucynta but not #120 and if he needs to keep his appt today. Called pt back and told the authorization for Nucynta was in progress and we can reschedule his appt. Appt has been rescheduled.

## 2018-07-06 NOTE — Patient Instructions (Addendum)
DME referral for new CPAP start. Auto titrate 5-15cm H20, mask of choice, humidification and supplies   Goal 100% compliance with cpap, aim to wear 4-6 hours a night  Do not drive if experiencing excessive daytime fatigue or somnolence   FU in 6 weeks with Dr. Elsworth Soho or Eustaquio Maize, NP

## 2018-07-07 ENCOUNTER — Telehealth: Payer: Self-pay

## 2018-07-07 ENCOUNTER — Telehealth: Payer: Self-pay | Admitting: Adult Health

## 2018-07-07 ENCOUNTER — Encounter: Payer: Self-pay | Admitting: Primary Care

## 2018-07-07 NOTE — Telephone Encounter (Signed)
Recieved response from covermymeds in regards to this prior auth, it as returned as authorized from 07/06/2018 - 07/07/2019.

## 2018-07-07 NOTE — Assessment & Plan Note (Signed)
-   Completed desensitization study at [facility] new CPAP start, auto titrate 5-15cm H20 with nasal pillow mask, chin strap, humidification and supplies - FU in 6 weeks with download

## 2018-07-07 NOTE — Assessment & Plan Note (Signed)
Strongly encourage smoking cessation >5 mins  Still smoking <1 ppd On chantix for the last 2 months Ok to use nicotine patches

## 2018-07-07 NOTE — Telephone Encounter (Signed)
Recieved electronic request for a prior authorization for nucynta medication.  Begun prior auth on 07-06-2018.

## 2018-07-07 NOTE — Telephone Encounter (Signed)
Afternoon Melissa, Can you please call Mr. Twombly and ask what sx's he is having? I am happy to placed Urology referral, however just need some additional information. Thanks! Valetta Fuller

## 2018-07-07 NOTE — Telephone Encounter (Signed)
Patient called during lunch and left VM about wanting to get established with a urologist for an unspecified reason. Patient would like to discuss possibly getting this referral and if an OV is needed. Please advise

## 2018-07-07 NOTE — Telephone Encounter (Signed)
Patient was last seen on 04/07/2018 and has f/u on 10/03/2018.  Please review and advise if patient needs an appointment to discuss referral before placing the order. Thanks. MPulliam, CMA/RT(R)

## 2018-07-08 NOTE — Telephone Encounter (Signed)
Spoke to patient.  He is having to get up to urinate several times per night. MPulliam, CMA/RT(R)

## 2018-07-11 ENCOUNTER — Other Ambulatory Visit: Payer: Self-pay | Admitting: Adult Health

## 2018-07-11 DIAGNOSIS — R351 Nocturia: Secondary | ICD-10-CM

## 2018-07-11 NOTE — Telephone Encounter (Signed)
Good Morning Tonya, Can you please call Mr. Rago and share- I placed order to Urology Thanks! Valetta Fuller

## 2018-07-11 NOTE — Telephone Encounter (Signed)
Pt informed.  T. Nelson, CMA 

## 2018-07-14 ENCOUNTER — Encounter: Payer: BC Managed Care – PPO | Attending: Physical Medicine & Rehabilitation | Admitting: Registered Nurse

## 2018-07-14 ENCOUNTER — Encounter: Payer: Self-pay | Admitting: Registered Nurse

## 2018-07-14 VITALS — BP 133/87 | HR 92 | Ht 77.0 in | Wt 334.2 lb

## 2018-07-14 DIAGNOSIS — Z79899 Other long term (current) drug therapy: Secondary | ICD-10-CM

## 2018-07-14 DIAGNOSIS — M25512 Pain in left shoulder: Secondary | ICD-10-CM

## 2018-07-14 DIAGNOSIS — M25511 Pain in right shoulder: Secondary | ICD-10-CM

## 2018-07-14 DIAGNOSIS — M79671 Pain in right foot: Secondary | ICD-10-CM | POA: Diagnosis not present

## 2018-07-14 DIAGNOSIS — M79672 Pain in left foot: Secondary | ICD-10-CM | POA: Diagnosis not present

## 2018-07-14 DIAGNOSIS — M5136 Other intervertebral disc degeneration, lumbar region: Secondary | ICD-10-CM

## 2018-07-14 DIAGNOSIS — M545 Low back pain, unspecified: Secondary | ICD-10-CM

## 2018-07-14 DIAGNOSIS — G608 Other hereditary and idiopathic neuropathies: Secondary | ICD-10-CM | POA: Insufficient documentation

## 2018-07-14 DIAGNOSIS — M21611 Bunion of right foot: Secondary | ICD-10-CM | POA: Insufficient documentation

## 2018-07-14 DIAGNOSIS — M21612 Bunion of left foot: Secondary | ICD-10-CM | POA: Diagnosis not present

## 2018-07-14 DIAGNOSIS — G894 Chronic pain syndrome: Secondary | ICD-10-CM | POA: Insufficient documentation

## 2018-07-14 DIAGNOSIS — G609 Hereditary and idiopathic neuropathy, unspecified: Secondary | ICD-10-CM | POA: Diagnosis not present

## 2018-07-14 DIAGNOSIS — Z5181 Encounter for therapeutic drug level monitoring: Secondary | ICD-10-CM

## 2018-07-14 DIAGNOSIS — M5416 Radiculopathy, lumbar region: Secondary | ICD-10-CM | POA: Diagnosis not present

## 2018-07-14 DIAGNOSIS — G8929 Other chronic pain: Secondary | ICD-10-CM

## 2018-07-14 NOTE — Patient Instructions (Addendum)
No medications were prescribed today: Since you're not having any relief in your pain.     Obtain X-rays, I will discuss with Dr. Letta Pate and Give you a call.

## 2018-07-14 NOTE — Progress Notes (Signed)
Subjective:    Patient ID: Eduardo [name], male    DOB: 1970-12-19, 47 y.o.   MRN: [id number]  HPI: [name] is a 47 year old male who returns for follow up appointment for chronic pain and medication refill. He states his pain is located in his bilateral shoulders, lower back radiating into is bilateral lower extremities and bilateral feet. [name] reports his pain has increased in intensity in his lower back and the Nucynta offers no relief o his pain. Also states he is experiencing incontinence of urine over the last 6 months, he states he reporting this to his PCP, this is the first time he mention the above to this provider. We will obtain X-ray and discuss findings with Dr. Letta [name] he verbalizes understanding. He rates his pain 7. His current exercise regime is walking.   Mr. Shuck Morphine Equivalent is 160.00 MME. Last UDS was Performed on 12/17/2017, it was consistent.    Pain Inventory Average Pain 9 Pain Right Now 7 My pain is intermittent, constant, sharp, burning, stabbing, tingling and aching  In the last 24 hours, has pain interfered with the following? General activity 7 Relation with others 5 Enjoyment of life 9 What TIME of day is your pain at its worst? all Sleep (in general) Poor  Pain is worse with: walking, sitting, inactivity and standing Pain improves with: rest and medication Relief from Meds: 5  Mobility walk without assistance how many minutes can you walk? 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  no  Physicians involved in your care Any changes since last visit?  no   Family History  Problem Relation Age of Onset  . Cancer Maternal Grandmother        breast, ovarian & colon  . Hypertension Mother   .  Diabetes Mother   . Hypertension Father   . Heart disease Father   . Stroke Paternal Grandfather   . Neuropathy Neg Hx    Social History   Socioeconomic History  . Marital status: Divorced    Spouse name: Not on file  . Number of children: 2  . Years of education: 12th   . Highest education level: Not on file  Occupational History  . Occupation: [name]  Social Needs  . Financial resource strain: Not on file  . Food insecurity:    Worry: Not on file    Inability: Not on file  . Transportation needs:    Medical: Not on file    Non-medical: Not on file  Tobacco Use  . Smoking status: Current Every Day Smoker    Packs/day: 1.00    Years: 33.00    Pack years: 33.00    Types: Cigarettes  . Smokeless tobacco: Never Used  Substance and Sexual Activity  . Alcohol use: No    Alcohol/week: 0.0 standard drinks    Comment: Rarely  . Drug use: No  . Sexual activity: Yes    Birth control/protection: None  Lifestyle  . Physical activity:    Days per week: Not on file    Minutes per session: Not on file  . Stress: Not on file  Relationships  . Social connections:    Talks on phone: Not on file    Gets together: Not on file    Attends religious service:  Not on file    Active member of club or organization: Not on file    Attends meetings of clubs or organizations: Not on file    Relationship status: Not on file  Other Topics Concern  . Not on file  Social History Narrative   Lives at home w/ his step-mother   Right-handed   Drinks about 2-3 Mt Dews a day   Past Surgical History:  Procedure Laterality Date  . Irrigation and debridement of back abscess    . NASAL SEPTOPLASTY W/ TURBINOPLASTY [name] 02/03/2018   Procedure: NASAL SEPTOPLASTY WITH TURBINATE REDUCTION;  Surgeon: Eduardo Montane, MD;  Location: [place];  Service: ENT;  Laterality: [name];  . PILONIDAL CYST DRAINAGE [name] 04/10/2017   Procedure: IRRIGATION AND DEBRIDEMENT BACK ABSCESS;  Surgeon: Eduardo Boston, MD;  Location: [facility];  Service: General;  Laterality: [name];  . SINUS ENDO WITH FUSION [name] 02/03/2018   Procedure: ENDOSCOPIC SINUS SURGERY WITH FUSION;  Surgeon: Eduardo Montane, MD;  Location: [place];  Service: ENT;  Laterality: [name];  . THORACIC OUTLET SURGERY  2000  . WRIST FUSION  2002   Past Medical History:  Diagnosis Date  . Anxiety   . Chronic back pain   . Degenerative disk disease   . Depression   . DVT of upper extremity (deep vein thrombosis) ([place])   . GERD (gastroesophageal reflux disease)   . Hypertension   . MRSA (methicillin resistant staph aureus) culture positive   . Peripheral neuropathy   . Pilonidal cyst   . Pulmonary embolus ([place])    no blood thinner 2002  . Sleep apnea    can't use cpap due to deviated nasal septumg   BP 133/87   Pulse 92   Ht 6\' 5"  (1.956 m) Comment: reported  Wt (!) 334 lb 3.2 oz (151.6 kg)   SpO2 93%   BMI 39.63 kg/m   Opioid Risk Score:   Fall Risk Score:  `1  Depression screen PHQ 2/9  Depression screen [facility] 2/9 07/14/2018 04/07/2018 03/08/2018 02/09/2018 11/11/2017 10/14/2017 08/11/2017  Decreased Interest 3 1 1 2 1 1 2   Down, Depressed, Hopeless 3 1 1  0 1 1 2   PHQ - 2 Score 6 2 2 2 2 2 4   Altered sleeping - 3 2 2 1 3 3   Tired, decreased energy - 3 3 2 1 2 3   Change in appetite - 2 2 2 2 1 3   Feeling bad or failure about yourself  - 1 0 0 1 1 2   Trouble concentrating - 3 2 3 2 2 3   Moving slowly or fidgety/restless - 3 1 2 2 2 2   Suicidal thoughts - 0 0 0 0 0 0  PHQ-9 Score - 17 12 13 11 13 20   Difficult doing work/chores - Very difficult Very difficult Somewhat difficult Somewhat difficult Very difficult Very difficult  Some recent data might be hidden    Review of Systems  Constitutional: Positive for unexpected weight change.  HENT: Negative.   Eyes: Negative.   Respiratory: Positive for apnea.   Cardiovascular: Negative.   Gastrointestinal: Negative.   Endocrine: Negative.   Genitourinary:       Bladder control  Musculoskeletal:  Positive for gait problem.       Spasms  Skin: Negative.   Allergic/Immunologic: Negative.   Neurological: Positive for tremors, weakness and numbness.       Tingling  Hematological: Negative.   Psychiatric/Behavioral: Positive for dysphoric mood. The patient is nervous/anxious.  All other systems reviewed and are negative.      Objective:   Physical Exam  Constitutional: He is oriented to person, place, and time. He appears well-developed and well-nourished.  HENT:  Head: Normocephalic and atraumatic.  Neck: Normal range of motion. Neck supple.  Cardiovascular: Normal rate and regular rhythm.  Pulmonary/Chest: Effort normal and breath sounds normal.  Musculoskeletal:  Normal Muscle Bulk and Muscle Testing Reveals: Upper Extremities: Full ROM and Muscle Strength 5/5 Lumbar Hypersensitivity + Leg Raise Produces Pain into Lumbar and Bilateral Lower Extremities Lower Extremities: Full ROM and Muscle Strength 5/5 Arises from Table Slowly Antalgic Gait  Neurological: He is alert and oriented to person, place, and time.  Skin: Skin is warm and dry.  Psychiatric: He has a normal mood and affect. His behavior is normal.  Nursing note and vitals reviewed.         Assessment & Plan:  1. Acute exacerbation of chronic low back pain: RX: Lumbar X-rays  2. Bilateral Shoulder Pain: RX: X-rays. 07/14/2018. 3. Chronic idiopathic axonal polyneuropathy: Continue Lyrica. 07/14/2018.  4. Posterior tibial tendinitis of left lower extremity: Continue HEP as Tolerated. Continue to Monitor. 07/14/2018 5. Lumbar DDD/ Lumbar Radiculitis: Continue current medication regime and HEP as Tolerated. 07/14/2018. 4. Chronic Pain Syndrome: Continue: Nucynta 100 mg one tablet every 6 hours as needed #120.  We will continue the opioid monitoring program, this consists of regular clinic visits, examinations, urine drug screen, pill counts as well as use of [place] Controlled Substance Reporting  system.  30 minutes of face to face patient care time was spent during this visit. All questions were encouraged and answered.  F/U in 1 month

## 2018-07-18 ENCOUNTER — Ambulatory Visit
Admission: RE | Admit: 2018-07-18 | Discharge: 2018-07-18 | Disposition: A | Discharge status: Home / Self Care | Payer: BC Managed Care – PPO | Source: Ambulatory Visit | Attending: Registered Nurse | Admitting: Registered Nurse

## 2018-07-19 ENCOUNTER — Telehealth: Payer: Self-pay | Admitting: Registered Nurse

## 2018-07-19 NOTE — Telephone Encounter (Signed)
X-ray results was reviewed with Dr. Letta Pate. [name] reports no relief of his lower back pain with Nucynta. We will prescribe Oxycodone 5 mg every 8 hours as needed for pain. Dr. Letta Pate Recommended MBB. Placed a call to [name] Bilateral shoulder X-ray results was reviewed, and the above was discussed. [name] refuses the MBB at this time. We will change his Nucynta to Oxycodone on his next visit per his request. He picked up [place] on 07/06/2018, per PMP Aware Web-site. His October appointment was changed to accommodate medication refill to 08/02/2018 at 3:20, arrival time at 3:00.He verbalizes understanding.

## 2018-07-21 ENCOUNTER — Other Ambulatory Visit: Payer: Self-pay | Admitting: Adult Health

## 2018-07-21 ENCOUNTER — Other Ambulatory Visit: Payer: Self-pay | Admitting: Physical Medicine & Rehabilitation

## 2018-07-21 DIAGNOSIS — G609 Hereditary and idiopathic neuropathy, unspecified: Secondary | ICD-10-CM

## 2018-07-21 DIAGNOSIS — E538 Deficiency of other specified B group vitamins: Secondary | ICD-10-CM

## 2018-07-26 ENCOUNTER — Ambulatory Visit (INDEPENDENT_AMBULATORY_CARE_PROVIDER_SITE_OTHER): Payer: BC Managed Care – PPO | Admitting: Orthopedic Surgery

## 2018-08-02 ENCOUNTER — Encounter: Payer: Self-pay | Admitting: Registered Nurse

## 2018-08-02 ENCOUNTER — Encounter: Payer: BC Managed Care – PPO | Attending: Physical Medicine & Rehabilitation | Admitting: Registered Nurse

## 2018-08-02 ENCOUNTER — Other Ambulatory Visit: Payer: Self-pay

## 2018-08-02 VITALS — BP 128/83 | HR 93 | Ht 76.0 in | Wt 332.2 lb

## 2018-08-02 DIAGNOSIS — M21611 Bunion of right foot: Secondary | ICD-10-CM | POA: Diagnosis not present

## 2018-08-02 DIAGNOSIS — G609 Hereditary and idiopathic neuropathy, unspecified: Secondary | ICD-10-CM

## 2018-08-02 DIAGNOSIS — M79671 Pain in right foot: Secondary | ICD-10-CM | POA: Insufficient documentation

## 2018-08-02 DIAGNOSIS — M79672 Pain in left foot: Secondary | ICD-10-CM | POA: Diagnosis not present

## 2018-08-02 DIAGNOSIS — M21612 Bunion of left foot: Secondary | ICD-10-CM | POA: Diagnosis not present

## 2018-08-02 DIAGNOSIS — G608 Other hereditary and idiopathic neuropathies: Secondary | ICD-10-CM | POA: Diagnosis not present

## 2018-08-02 DIAGNOSIS — G894 Chronic pain syndrome: Secondary | ICD-10-CM | POA: Insufficient documentation

## 2018-08-02 DIAGNOSIS — M5416 Radiculopathy, lumbar region: Secondary | ICD-10-CM

## 2018-08-02 DIAGNOSIS — M5136 Other intervertebral disc degeneration, lumbar region: Secondary | ICD-10-CM | POA: Diagnosis not present

## 2018-08-02 DIAGNOSIS — Z79891 Long term (current) use of opiate analgesic: Secondary | ICD-10-CM

## 2018-08-02 DIAGNOSIS — Z5181 Encounter for therapeutic drug level monitoring: Secondary | ICD-10-CM

## 2018-08-02 MED ORDER — OXYCODONE HCL 5 MG PO TABS: 5.0000 mg | ORAL_TABLET | Freq: Three times a day (TID) | ORAL | 0 refills | Status: DC | PRN

## 2018-08-02 NOTE — Progress Notes (Signed)
Subjective:    Patient ID: Eduardo [name], male    DOB: 01-29-1971, 47 y.o.   MRN: [id number]  HPI: [name] is a 47 year old male who returns for follow up appointment for chronic pain and medication refill. He states his pain is located in his lower back radiating into his bilateral lower extremities. He rates his pain 9. His current exercise regime is walking.   Spoke with [name] on 07/19/2018, Nucynta will be discontinued and Oxycodone prescribe, see note for details.   [name] Morphine Equivalent is 160.00 MME. His last UDS was Performed on 12/17/2017, it was consistent. UDS ordered today.   Pain Inventory Average Pain 8 Pain Right Now 9 My pain is sharp, burning, stabbing, tingling and aching  In the last 24 hours, has pain interfered with the following? General activity 8 Relation with others 6 Enjoyment of life 9 What TIME of day is your pain at its worst? morning and evening Sleep (in general) Fair  Pain is worse with: walking, sitting and standing Pain improves with: medication Relief from Meds: 8  Mobility walk without assistance how many minutes can you walk? 5 ability to climb steps?  yes do you drive?  yes  Function employed # of hrs/week 40  Neuro/Psych bladder control problems weakness numbness tremor tingling trouble walking depression anxiety  Prior Studies Any changes since last visit?  no  Physicians involved in your care Any changes since last visit?  no   Family History  Problem Relation Age of Onset  . Cancer Maternal Grandmother        breast, ovarian & colon  . Hypertension Mother   . Diabetes Mother   . Hypertension Father   . Heart disease Father   . Stroke Paternal Grandfather   . Neuropathy Neg Hx    Social History   Socioeconomic History  . Marital status: Divorced    Spouse name: Not on file  . Number of children: 2  . Years of education: 12th   . Highest education level: Not on file   Occupational History  . Occupation: [name]  Social Needs  . Financial resource strain: Not on file  . Food insecurity:    Worry: Not on file    Inability: Not on file  . Transportation needs:    Medical: Not on file    Non-medical: Not on file  Tobacco Use  . Smoking status: Current Every Day Smoker    Packs/day: 1.00    Years: 33.00    Pack years: 33.00    Types: Cigarettes  . Smokeless tobacco: Never Used  Substance and Sexual Activity  . Alcohol use: No    Alcohol/week: 0.0 standard drinks    Comment: Rarely  . Drug use: No  . Sexual activity: Yes    Birth control/protection: None  Lifestyle  . Physical activity:    Days per week: Not on file    Minutes per session: Not on file  . Stress: Not on file  Relationships  . Social connections:    Talks on phone: Not on file    Gets together: Not on file    Attends religious service: Not on file    Active member of club or organization: Not on file    Attends meetings of clubs or organizations: Not on file    Relationship status: Not on file  Other Topics Concern  . Not on file  Social History Narrative   Lives at  home w/ his step-mother   Right-handed   Drinks about 2-3 Mt Dews a day   Past Surgical History:  Procedure Laterality Date  . Irrigation and debridement of back abscess    . NASAL SEPTOPLASTY W/ TURBINOPLASTY [name] 02/03/2018   Procedure: NASAL SEPTOPLASTY WITH TURBINATE REDUCTION;  Surgeon: [name], MD;  Location: [place];  Service: ENT;  Laterality: [name];  . PILONIDAL CYST DRAINAGE [name] 04/10/2017   Procedure: IRRIGATION AND DEBRIDEMENT BACK ABSCESS;  Surgeon: [name], MD;  [facility];  Service: General;  Laterality: [name];  . SINUS ENDO WITH FUSION [name] 02/03/2018   Procedure: ENDOSCOPIC SINUS SURGERY WITH FUSION;  Surgeon: [name], MD;  Location: [place];  Service: ENT;  Laterality: [name];  . THORACIC OUTLET SURGERY  2000  . WRIST FUSION  2002   Past Medical History:  Diagnosis Date  . Anxiety     . Chronic back pain   . Degenerative disk disease   . Depression   . DVT of upper extremity (deep vein thrombosis) ([place])   . GERD (gastroesophageal reflux disease)   . Hypertension   . MRSA (methicillin resistant staph aureus) culture positive   . Peripheral neuropathy   . Pilonidal cyst   . Pulmonary embolus ([place])    no blood thinner 2002  . Sleep apnea    can't use cpap due to deviated nasal septumg   BP 128/83   Pulse 93   Ht 6\' 4"  (1.93 m)   Wt (!) 332 lb 3.2 oz (150.7 kg)   SpO2 92%   BMI 40.44 kg/m   Opioid Risk Score:   Fall Risk Score:  `1  Depression screen PHQ 2/9  Depression screen [facility] 2/9 08/02/2018 07/14/2018 04/07/2018 03/08/2018 02/09/2018 11/11/2017 10/14/2017  Decreased Interest 1 3 1 1 2 1 1   Down, Depressed, Hopeless 1 3 1 1  0 1 1  PHQ - 2 Score 2 6 2 2 2 2 2   Altered sleeping - - 3 2 2 1 3   Tired, decreased energy - - 3 3 2 1 2   Change in appetite - - 2 2 2 2 1   Feeling bad or failure about yourself  - - 1 0 0 1 1  Trouble concentrating - - 3 2 3 2 2   Moving slowly or fidgety/restless - - 3 1 2 2 2   Suicidal thoughts - - 0 0 0 0 0  PHQ-9 Score - - 17 12 13 11 13   Difficult doing work/chores - - Very difficult Very difficult Somewhat difficult Somewhat difficult Very difficult  Some recent data might be hidden    Review of Systems  Constitutional: Positive for diaphoresis and unexpected weight change.  HENT: Negative.   Eyes: Negative.   Respiratory: Positive for apnea and shortness of breath.   Cardiovascular: Negative.   Gastrointestinal: Negative.   Endocrine: Negative.   Genitourinary: Negative.   Musculoskeletal: Negative.   Skin: Negative.   Allergic/Immunologic: Negative.   Neurological: Negative.   Hematological: Negative.   Psychiatric/Behavioral: Negative.   All other systems reviewed and are negative.      Objective:   Physical Exam  Constitutional: He is oriented to person, place, and time. He appears well-developed and  well-nourished.  HENT:  Head: Normocephalic and atraumatic.  Neck: Normal range of motion. Neck supple.  Cardiovascular: Normal rate and regular rhythm.  Pulmonary/Chest: Effort normal and breath sounds normal.  Musculoskeletal:  Normal Muscle Bulk and Muscle Testing Reveals: Upper Extremities: Full ROM and Muscle  Strength 5/5 Lumbar Paraspinal Tenderness: L-3--L-55 Lower Extremities: Full ROM and Muscle Strength 5/5 Arises from Table with Ease Narrow Based Gait   Neurological: He is alert and oriented to person, place, and time.  Skin: Skin is warm and dry.  Psychiatric: He has a normal mood and affect. His behavior is normal.  Nursing note and vitals reviewed.         Assessment & Plan:  1. Chronic idiopathic axonal polyneuropathy: Continue Lyrica. 08/02/2018. 2. Posterior tibial tendinitis of left lower extremity: Continue HEP as Tolerated. Continue to Monitor. 08/02/2018 3. Lumbar DDD/ Lumbar Radiculitis: Continue current medication regime and HEP as Tolerated. 08/02/2018. 4. Chronic Pain Syndrome: RX: Oxycodone 5mg  one tablet every 8 hours #90. 08/02/2018. We will continue the opioid monitoring program, this consists of regular clinic visits, examinations, urine drug screen, pill counts as well as use of [place] Controlled Substance Reporting system.  20 minutes of face to face patient care time was spent during this visit. All questions were encouraged and answered.  F/U in 1 month

## 2018-08-04 ENCOUNTER — Encounter (INDEPENDENT_AMBULATORY_CARE_PROVIDER_SITE_OTHER): Payer: Self-pay | Admitting: Orthopedic Surgery

## 2018-08-04 ENCOUNTER — Ambulatory Visit (INDEPENDENT_AMBULATORY_CARE_PROVIDER_SITE_OTHER): Payer: BC Managed Care – PPO | Admitting: Orthopedic Surgery

## 2018-08-04 ENCOUNTER — Telehealth: Payer: Self-pay | Admitting: *Deleted

## 2018-08-04 VITALS — Ht 76.0 in | Wt 332.2 lb

## 2018-08-04 DIAGNOSIS — I87323 Chronic venous hypertension (idiopathic) with inflammation of bilateral lower extremity: Secondary | ICD-10-CM | POA: Diagnosis not present

## 2018-08-04 DIAGNOSIS — Z6841 Body Mass Index (BMI) 40.0 and over, adult: Secondary | ICD-10-CM

## 2018-08-04 DIAGNOSIS — M25572 Pain in left ankle and joints of left foot: Secondary | ICD-10-CM | POA: Diagnosis not present

## 2018-08-04 DIAGNOSIS — M76822 Posterior tibial tendinitis, left leg: Secondary | ICD-10-CM

## 2018-08-04 NOTE — Telephone Encounter (Signed)
Patient reports one day later that medication change does not help

## 2018-08-05 ENCOUNTER — Encounter (INDEPENDENT_AMBULATORY_CARE_PROVIDER_SITE_OTHER): Payer: Self-pay | Admitting: Orthopedic Surgery

## 2018-08-05 NOTE — Telephone Encounter (Signed)
Return Mr. Amory call, no answer. Left message to return the call.

## 2018-08-05 NOTE — Telephone Encounter (Signed)
Patient called back, asking for a call back

## 2018-08-05 NOTE — Progress Notes (Signed)
Office Visit Note   Patient: Eduardo [name]           Date of Birth: [birth year]           MRN: [id number] Visit Date: 08/04/2018              Requested by: [name], NP [place], [place] [address] PCP: [name], NP  Chief Complaint  Patient presents with  . Right Foot - Follow-up  . Left Foot - Follow-up      HPI: Patient is a 47 year old gentleman presents in follow-up for posterior tibial tendon insufficiency.  Patient has undergone excellent conservative therapy including immobilization he has had a MRI scan obtained.  Patient states the pain continues to get worse and he cannot work on his feet due to progressive pain as the day goes by.  Complains of pain along the posterior tibial tendon as well as over the sinus Tarsi.  Assessment & Plan: Visit Diagnoses:  1. Idiopathic chronic venous hypertension of both lower extremities with inflammation   2. Pain in left ankle and joints of left foot   3. Morbid obesity ([place])   4. Body mass index 40.0-44.9, adult ([place])   5. Posterior tibial tendinitis, left leg     Plan: Discussed with the patient with failure of conservative care his option would be to proceed with fusion of the talonavicular joint and subtalar joint.  Risks and benefits were discussed including risk of the bone not healing risk of the skin not healing need for additional surgery risk of infection risk of DVT.  Discussed that for the surgery to heal he would have to completely stop smoking.  Patient states he understands and will call us when he is ready to schedule surgery.  Follow-Up Instructions: Return in about 1 week (around 08/11/2018).   Ortho Exam  Patient is alert, oriented, no adenopathy, well-dressed, normal affect, normal respiratory effort. Examination patient is a good dorsalis pedis pulse is pitting edema both lower extremities with brawny skin color changes.  Patient has a pronated valgus foot on the left with a  positive too many toe sign.  Patient cannot do a single limb heel raise on the left.  He is tender to palpation of the sinus Tarsi and tender to palpation along the posterior tibial tendon he has complete Truman Hayward lost his arch on the left foot compared to the right with the posterior tibial tendon insufficiency.  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  Imaging: No results found. No images are attached to the encounter.  Labs: Lab Results  Component Value Date   HGBA1C 4.6 (L) 11/11/2017   HGBA1C 4.8 05/09/2015   HGBA1C 4.8 10/25/2014   CRP 5.7 (H) 05/09/2015   LABURIC 7.1 03/08/2018   REPTSTATUS 04/15/2017 FINAL 04/10/2017   GRAMSTAIN  04/10/2017    ABUNDANT WBC PRESENT, PREDOMINANTLY PMN MODERATE GRAM POSITIVE COCCI IN CLUSTERS    CULT  04/10/2017    ABUNDANT METHICILLIN RESISTANT STAPHYLOCOCCUS AUREUS NO ANAEROBES ISOLATED Performed at [facility], [place] [address] [place],  [address]    LABORGA METHICILLIN RESISTANT STAPHYLOCOCCUS AUREUS 04/10/2017     Lab Results  Component Value Date   ALBUMIN 4.1 04/07/2018   ALBUMIN 4.9 11/11/2017   ALBUMIN 4.4 04/06/2017   LABURIC 7.1 03/08/2018    Body mass index is 40.44 kg/m.  Orders:  No orders of the defined types were placed in this encounter.  No  orders of the defined types were placed in this encounter.    Procedures: No procedures performed  Clinical Data: No additional findings.  ROS:  All other systems negative, except as noted in the HPI. Review of Systems  Objective: Vital Signs: Ht 6\' 4"  (1.93 m)   Wt (!) 332 lb 3.2 oz (150.7 kg)   BMI 40.44 kg/m   Specialty Comments:  No specialty comments available.  PMFS History: Patient Active Problem List   Diagnosis Date Noted  . Body mass index 40.0-44.9, adult ([place]) 04/19/2018  . Pain in left foot 04/19/2018  . Shortness of breath 04/07/2018  . Lower extremity edema 04/07/2018  . Stasis dermatitis of both legs 04/07/2018    . Hypokalemia 04/07/2018  . Healthcare maintenance 04/07/2018  . Great toe pain, left 03/08/2018  . Acute gout involving toe of left foot 03/08/2018  . Low libido 02/09/2018  . OSA (obstructive sleep apnea) 02/03/2018  . Status post nasal septoplasty 02/03/2018  . Deviated septum 11/11/2017  . Umbilical hernia without obstruction and without gangrene 11/11/2017  . Scrotal pain 11/11/2017  . Scrotal cyst 11/11/2017  . Screening, lipid 11/11/2017  . Screening for diabetes mellitus (DM) 11/11/2017  . Screening for thyroid disorder 11/11/2017  . Anxiety and depression 08/11/2017  . Acute maxillary sinusitis 08/11/2017  . 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 Lumbar degenerative disc disease 08/02/2017  . Acute left ankle pain 07/12/2017  . Skin infection 06/03/2017  . Chronic folliculitis 23/30/0762  . [place] 04/10/2017  . Cellulitis and abscess of trunk s/p I&D 04/10/2017 04/06/2017  . Fever chills 04/06/2017  . History of MRSA infection 04/06/2017  . Physical exam, annual 03/03/2017  . Neuropathy 09/28/2015  . Major depression 12/26/2012  . Generalized anxiety disorder 12/26/2012  . Opiate dependence ([place]) 12/24/2012  . Benzodiazepine dependence ([place]) 12/24/2012  . HTN (hypertension) 12/24/2012  . 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 Morbid obesity ([place]) 09/09/2010  . TOBACCO ABUSE 09/09/2010  . LOW BACK PAIN, CHRONIC 09/09/2010   Past Medical History:  Diagnosis Date  . Anxiety   . Chronic back pain   . Degenerative disk disease   . Depression   . DVT of upper extremity (deep vein thrombosis) ([place])   . GERD (gastroesophageal reflux disease)   . Hypertension   . MRSA (methicillin resistant staph aureus) culture positive   . Peripheral neuropathy   . Pilonidal cyst   . Pulmonary embolus ([place])    no blood thinner 2002  . Sleep apnea    can't use cpap due to deviated nasal septumg    Family History  Problem Relation Age of Onset  . Cancer  Maternal Grandmother        breast, ovarian & colon  . Hypertension Mother   . Diabetes Mother   . Hypertension Father   . Heart disease Father   . Stroke Paternal Grandfather   . Neuropathy Neg Hx     Past Surgical History:  Procedure Laterality Date  . Irrigation and debridement of back abscess    . NASAL SEPTOPLASTY W/ TURBINOPLASTY [name] 02/03/2018   Procedure: NASAL SEPTOPLASTY WITH TURBINATE REDUCTION;  Surgeon: [name], MD;  Location: [facility];  Service: ENT;  Laterality: [name];  . PILONIDAL CYST DRAINAGE [name] 04/10/2017   Procedure: IRRIGATION AND DEBRIDEMENT BACK ABSCESS;  Surgeon: [name], MD;  Location: [facility];  Service: General;  Laterality: [name];  . SINUS ENDO WITH FUSION [name] 02/03/2018   Procedure: ENDOSCOPIC SINUS SURGERY WITH FUSION;  Surgeon: [name], MD;  Location: [facility];  Service: ENT;  Laterality: [name];  . THORACIC OUTLET SURGERY  2000  . WRIST FUSION  2002   Social History   Occupational History  . Occupation: [name]  Tobacco Use  . Smoking status: Current Every Day Smoker    Packs/day: 1.00    Years: 33.00    Pack years: 33.00    Types: Cigarettes  . Smokeless tobacco: Never Used  Substance and Sexual Activity  . Alcohol use: No    Alcohol/week: 0.0 standard drinks    Comment: Rarely  . Drug use: No  . Sexual activity: Yes    Birth control/protection: None

## 2018-08-05 NOTE — Telephone Encounter (Signed)
Return Eduardo [name] call, he reports increase intensity of pain in his left foot, only receiving 3 hours of relief of his pain with Oxycodone. We will increase his tablets to every 6 hours , he verbalizes understanding. Instructed to call next week for medication evaluation. He verbalizes understanding.

## 2018-08-07 LAB — TOXASSURE SELECT,+ANTIDEPR,UR

## 2018-08-10 ENCOUNTER — Telehealth: Payer: Self-pay | Admitting: *Deleted

## 2018-08-10 NOTE — Telephone Encounter (Signed)
Urine drug screen for this encounter is consistent for prescribed medication 

## 2018-08-12 ENCOUNTER — Telehealth: Payer: Self-pay | Admitting: *Deleted

## 2018-08-12 NOTE — Telephone Encounter (Signed)
Return Eduardo [name] call, no answer. Left message: He was given permission to take his Oxycodone 5 mg tablet, five times a day as needed for pain,  over the weekend, no more than 5 tablets a day. He was instructed to call office on Monday 08/15/2018.

## 2018-08-12 NOTE — Telephone Encounter (Signed)
Eduardo [name] called and reported his medication is not working.

## 2018-08-15 ENCOUNTER — Encounter: Payer: Self-pay | Admitting: *Deleted

## 2018-08-15 ENCOUNTER — Encounter: Payer: Self-pay | Admitting: Registered Nurse

## 2018-08-15 ENCOUNTER — Other Ambulatory Visit: Payer: Self-pay | Admitting: Adult Health

## 2018-08-15 MED ORDER — OXYCODONE-ACETAMINOPHEN 7.5-325 MG PO TABS: 1.0000 | ORAL_TABLET | Freq: Four times a day (QID) | ORAL | 0 refills | Status: DC | PRN

## 2018-08-15 NOTE — Telephone Encounter (Signed)
duplicate

## 2018-08-15 NOTE — Telephone Encounter (Signed)
Patient reports he has 31 pills left.

## 2018-08-15 NOTE — Addendum Note (Signed)
Addended by: Bayard Hugger on: 08/15/2018 05:30 PM   Modules accepted: Orders

## 2018-08-15 NOTE — Telephone Encounter (Signed)
We will change Eduardo [name] Oxycodone 5 mg tablets from 5 times a day as need for pain. To Oxycodone 7.5/325 mg one tablet every 6 hours, for pain management. Eduardo [name] is awre of the above.

## 2018-08-15 NOTE — Telephone Encounter (Signed)
Patient left a message stating that he was returning a phone call from Friday October 25th.  Presumably to report the effectiveness of his medication increase.

## 2018-08-15 NOTE — Telephone Encounter (Signed)
Return Mr. Bearse call,  He's using the Oxycodone 5 times a day as instructed, it has helped with his increase intensity of lower back pain. He was instructed to call office with his Oxycodone count, he reports his medication is at home. He will leave a message on the clinical line he reports.[name]

## 2018-08-17 ENCOUNTER — Encounter: Payer: Self-pay | Admitting: Primary Care

## 2018-08-17 ENCOUNTER — Ambulatory Visit (INDEPENDENT_AMBULATORY_CARE_PROVIDER_SITE_OTHER): Payer: BC Managed Care – PPO | Admitting: Primary Care

## 2018-08-17 ENCOUNTER — Telehealth: Payer: Self-pay | Admitting: Primary Care

## 2018-08-17 DIAGNOSIS — G4733 Obstructive sleep apnea (adult) (pediatric): Secondary | ICD-10-CM | POA: Diagnosis not present

## 2018-08-17 NOTE — Telephone Encounter (Signed)
Called [employer].  The pt stated the day he got his CPAP the tube broke.  He called them and they said they would send one out.  He still had not received it.  Called and [employer] said the CPAP division is not available and they will call me back.

## 2018-08-17 NOTE — Progress Notes (Signed)
@Patient  ID: Eduardo [name], male    DOB: Dec 29, 1970, 47 y.o.   MRN: [id number]  Chief Complaint  Patient presents with  . Follow-up    CPAP compliance    Referring provider: [name], NP  HPI: 47 year old male, current smoker (<1ppd). PMH severe obstructive sleep apnea, hypertension, obesity. Patient of Dr. Elsworth Soho, seen for initial consult on 07/18 . Split night sleep study on 11/2017 showed AHI 83/hr. Not currently wearing CPAP. Reports claustrophobia, unable to tolerate full face mask. Ordered to complete desensitization study.  07/07/2018 Patient presents today for 2 month fu. He reports that he completed desensitization at [facility]. He still does not have cpap machine and has not been using. No report available, states he tried on nasal pillow mask and is willing to try at home. Patient is still smoking, reports less than 1ppd. On chantix, asking if ok to use nicotine patch.   08/21/2018 Patient presents today for 6 week follow-up for OSA. Using [employer] CPAP, has full face mask and likes it. States that the tubing on his CPAP is defective and that there is a gap/crack at the end where it connects. States that its "barley hanging together". He called his DME company which is [employer] and they are suppose to be sending him replacement tubing. He has been wearing cpap 50% of the times, temporarily patched it up. States that he feels more tired in the morning after wearing mask. Does not hear air leaking. Feels like its too much pressure at certain time.  When he uses it, feels more tired. Gets up every 2 hours to use Bathroom. Wet mattress at night. Takes HCTZ twice a day. He was taking it in the morning and lunch. And then tried taking am and pm. Has apt with Urology on Nov 5th.  [employer] Program Download: - Usage 11/20 days (55% compliance) - Average use 3 hrs 42 mins ( days used) - Auto titrate 5-15cm H20  - Mean pressure 7.6; Peak average 10.8cm  - Time  in large leak per day 2 hrs 37 mins  - AHI 22.7    Allergies  Allergen Reactions  . Pollen Extract Cough  . Dust Mite Extract Other (See Comments)  . Other Other (See Comments)    Ragweed Ragweed    Immunization History  Administered Date(s) Administered  . Tdap 07/30/2017    Past Medical History:  Diagnosis Date  . Anxiety   . Chronic back pain   . Degenerative disk disease   . Depression   . DVT of upper extremity (deep vein thrombosis) ([place])   . GERD (gastroesophageal reflux disease)   . Hypertension   . MRSA (methicillin resistant staph aureus) culture positive   . Peripheral neuropathy   . Pilonidal cyst   . Pulmonary embolus ([place])    no blood thinner 2002  . Sleep apnea    can't use cpap due to deviated nasal septumg    Tobacco History: Social History   Tobacco Use  Smoking Status Current Every Day Smoker  . Packs/day: 1.00  . Years: 33.00  . Pack years: 33.00  . Types: Cigarettes  Smokeless Tobacco Never Used   Ready to quit: Not Answered Counseling given: Not Answered   Outpatient Medications Prior to Visit  Medication Sig Dispense Refill  . albuterol (PROVENTIL HFA;VENTOLIN HFA) 108 (90 Base) MCG/ACT inhaler Inhale 2 puffs into the lungs every 6 (six) hours as needed for wheezing or shortness of breath. 1  Inhaler 2  . amLODipine (NORVASC) 2.5 MG tablet TAKE 1 TABLET (2.5 MG TOTAL) BY MOUTH DAILY. 90 tablet 2  . FLUoxetine (PROZAC) 20 MG capsule TAKE 1 CAPSULE BY MOUTH DAILY AFTER COMPLETION FIRST WEEK OF FLUOXETINE 10MG  90 capsule 2  . fluticasone (FLONASE) 50 MCG/ACT nasal spray SPRAY 2 SPRAYS IN EACH NOSTRIL DAILY 16 g 6  . hydrochlorothiazide (HYDRODIURIL) 25 MG tablet TAKE 1 TABLET (25 MG TOTAL) BY MOUTH 2 (TWO) TIMES DAILY. 180 tablet 0  . LYRICA 300 MG capsule TAKE 1 CAPSULE BY MOUTH TWICE A DAY 60 capsule 2  . montelukast (SINGULAIR) 10 MG tablet Take 1 tablet (10 mg total) by mouth at bedtime. 90 tablet 3  . Nutritional Supplements (JUICE  PLUS FIBRE PO) Take 4 each by mouth daily with lunch.    . oxyCODONE-acetaminophen (PERCOCET) 7.5-325 MG tablet Take 1 tablet by mouth every 6 (six) hours as needed. 120 tablet 0  . potassium chloride SA (K-DUR,KLOR-CON) 20 MEQ tablet Take 1 tablet (20 mEq total) by mouth daily. 90 tablet 2  . silver sulfADIAZINE (SILVADENE) 1 % cream Apply 1 application topically daily. 50 g 0  . varenicline (CHANTIX) 1 MG tablet Take 1 tablet (1 mg total) by mouth 2 (two) times daily. 60 tablet 2   No facility-administered medications prior to visit.     Review of Systems  Review of Systems  Constitutional: Positive for fatigue.  HENT: Negative.   Respiratory: Negative for apnea, cough, shortness of breath and wheezing.   Cardiovascular: Negative.   Genitourinary: Positive for frequency.    Physical Exam  BP 120/76 (BP Location: Left Arm, Cuff Size: Normal)   Pulse 78   Temp 98.6 F (37 C)   Ht 6\' 4"  (1.93 m)   Wt (!) 336 lb 6.4 oz (152.6 kg)   SpO2 95%   BMI 40.95 kg/m  Physical Exam  Constitutional: He is oriented to person, place, and time. He appears well-developed and well-nourished.  Overweight  HENT:  Head: Normocephalic and atraumatic.  Eyes: Pupils are equal, round, and reactive to light. EOM are normal.  Neck: Normal range of motion. Neck supple.  Cardiovascular: Normal rate and regular rhythm.  Pulmonary/Chest: Effort normal. No respiratory distress.  Mostly clear, dull exp wheeze   Musculoskeletal: Normal range of motion.  Neurological: He is oriented to person, place, and time.  Skin: Skin is warm and dry.  Psychiatric: He has a normal mood and affect. His behavior is normal. Judgment and thought content normal.     Lab Results:  CBC    Component Value Date/Time   WBC 12.5 (H) 02/03/2018 1124   RBC 4.57 02/03/2018 1124   HGB 14.9 02/03/2018 1124   HGB 15.4 11/11/2017 1523   HCT 41.4 02/03/2018 1124   HCT 43.8 11/11/2017 1523   PLT 170 02/03/2018 1124   PLT 175  11/11/2017 1523   MCV 90.6 02/03/2018 1124   MCV 93 11/11/2017 1523   MCH 32.6 02/03/2018 1124   MCHC 36.0 02/03/2018 1124   RDW 15.5 02/03/2018 1124   RDW 14.7 11/11/2017 1523   LYMPHSABS 2.1 11/11/2017 1523   MONOABS 1.0 04/10/2017 1416   EOSABS 0.2 11/11/2017 1523   BASOSABS 0.1 11/11/2017 1523    BMET    Component Value Date/Time   NA 142 04/07/2018 1700   K 3.5 04/07/2018 1700   CL 99 04/07/2018 1700   CO2 29 04/07/2018 1700   GLUCOSE 100 (H) 04/07/2018 1700   GLUCOSE 109 (  H) 01/31/2018 1554   BUN 20 04/07/2018 1700   CREATININE 1.23 04/07/2018 1700   CREATININE 1.12 10/25/2014 1124   CALCIUM 9.3 04/07/2018 1700   GFRNONAA 69 04/07/2018 1700   GFRNONAA 79 10/25/2014 1124   GFRAA 79 04/07/2018 1700   GFRAA >89 10/25/2014 1124    BNP No results found for: BNP  ProBNP No results found for: PROBNP  Imaging: No results found.   Assessment & Plan:   OSA (obstructive sleep apnea) - Poor compliance; pt is having trouble with broken equipment - Will look into getting replacement tubing for CPAP through [facility] - Review download of your CPAP in 4 weeks - Encourage using CPAP every night for 4-6 hours or more  - FU in 3-4 months    Martyn Ehrich, NP 08/21/2018

## 2018-08-17 NOTE — Patient Instructions (Addendum)
We are looking into helping you get replacement tubing for your CPAP through [employer]  Use CPAP every night, goal 4-6 hours or more+ Do not drive if experiencing excessive daytime fatigue or somnolence   We will review download of your CPAP in 4 weeks  Continue to work hard on cutting down cigarette use!!!  Follow-up in 3-4 months with Dr. Elsworth Soho

## 2018-08-21 ENCOUNTER — Encounter: Payer: Self-pay | Admitting: Primary Care

## 2018-08-21 NOTE — Assessment & Plan Note (Addendum)
-   Poor compliance; pt is having trouble with broken equipment - Will look into getting replacement tubing for CPAP through [facility] - Review download of CPAP in 4 weeks - Encourage using CPAP every night for 4-6 hours or more  - FU in 3-4 months

## 2018-08-30 ENCOUNTER — Encounter: Payer: BC Managed Care – PPO | Attending: Physical Medicine & Rehabilitation

## 2018-08-30 ENCOUNTER — Encounter: Payer: Self-pay | Admitting: Physical Medicine & Rehabilitation

## 2018-08-30 ENCOUNTER — Ambulatory Visit (HOSPITAL_BASED_OUTPATIENT_CLINIC_OR_DEPARTMENT_OTHER): Payer: BC Managed Care – PPO | Admitting: Physical Medicine & Rehabilitation

## 2018-08-30 VITALS — BP 138/86 | HR 86 | Resp 14 | Ht 76.0 in | Wt 343.0 lb

## 2018-08-30 DIAGNOSIS — M21612 Bunion of left foot: Secondary | ICD-10-CM | POA: Diagnosis not present

## 2018-08-30 DIAGNOSIS — M21611 Bunion of right foot: Secondary | ICD-10-CM | POA: Diagnosis not present

## 2018-08-30 DIAGNOSIS — G894 Chronic pain syndrome: Secondary | ICD-10-CM | POA: Diagnosis present

## 2018-08-30 DIAGNOSIS — M7662 Achilles tendinitis, left leg: Secondary | ICD-10-CM | POA: Diagnosis not present

## 2018-08-30 DIAGNOSIS — M79671 Pain in right foot: Secondary | ICD-10-CM | POA: Insufficient documentation

## 2018-08-30 DIAGNOSIS — G608 Other hereditary and idiopathic neuropathies: Secondary | ICD-10-CM | POA: Diagnosis not present

## 2018-08-30 DIAGNOSIS — M545 Low back pain: Secondary | ICD-10-CM | POA: Diagnosis not present

## 2018-08-30 DIAGNOSIS — G609 Hereditary and idiopathic neuropathy, unspecified: Secondary | ICD-10-CM | POA: Diagnosis not present

## 2018-08-30 DIAGNOSIS — G8929 Other chronic pain: Secondary | ICD-10-CM

## 2018-08-30 DIAGNOSIS — M79672 Pain in left foot: Secondary | ICD-10-CM | POA: Insufficient documentation

## 2018-08-30 DIAGNOSIS — M5136 Other intervertebral disc degeneration, lumbar region: Secondary | ICD-10-CM | POA: Diagnosis not present

## 2018-08-30 MED ORDER — OXYCODONE-ACETAMINOPHEN 7.5-325 MG PO TABS: 1.0000 | ORAL_TABLET | Freq: Four times a day (QID) | ORAL | 0 refills | Status: DC | PRN

## 2018-08-30 NOTE — Patient Instructions (Signed)
I think weight loss will help with your foot pain, and would ideally do weight loss surgery prior to foot and ankle surgery  Will not increase narcotic above current levels

## 2018-08-30 NOTE — Progress Notes (Signed)
Subjective:    Patient ID: Eduardo [name], male    DOB: 1970-11-21, 47 y.o.   MRN: [id number]  HPI 47 year old male with history of chronic axonal polyneuropathy with pain.  He has decreased sensation in both feet and legs.  He has been following up with Dr. Roel Cluck from orthopedic surgery and has had problems with Achilles tendinitis as well as talar and subtalar instability On Lyrica 300mg  BID  On oxycodone 7.5mg  QID, #120 Previously had been tried on tramadol 100 mg 4 times daily which was not very effective, Nucynta which was also not very effective He has been on maximum dose Lyrica 300 mg twice daily with only partial effectiveness He thinks his current pain management regimen is working a little bit better He has a prior history of THC use in 2014 from his pain medication that was being prescribed at that time was stopped.  Patient does have a behavioral health admission for depression in 2014 as well.  He had previously been on benzodiazepines  The patient has morbid obesity and has been on CPAP.  He is planning to undergo evaluation for bariatric surgery.  Patient remains independent with all self-care and mobility.  He does not require an assistive device. He works full-time for [employer], fueling buses and some Dealer work as well   Pain Inventory Average Pain 9 Pain Right Now 7 My pain is sharp, burning, stabbing, tingling and aching  In the last 24 hours, has pain interfered with the following? General activity 8 Relation with others 6 Enjoyment of life 9 What TIME of day is your pain at its worst? all Sleep (in general) Poor  Pain is worse with: walking, bending, sitting, inactivity and standing Pain improves with: rest and medication Relief from Meds: 7  Mobility walk without assistance how many minutes can you walk? 5 ability to climb steps?  yes do you drive?  yes Do you have any goals in this area?  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  no  Physicians involved in your care Any changes since last visit?  no   Family History  Problem Relation Age of Onset  . Cancer Maternal Grandmother        breast, ovarian & colon  . Hypertension Mother   . Diabetes Mother   . Hypertension Father   . Heart disease Father   . Stroke Paternal Grandfather   . Neuropathy Neg Hx    Social History   Socioeconomic History  . Marital status: Divorced    Spouse name: Not on file  . Number of children: 2  . Years of education: 12th   . Highest education level: Not on file  Occupational History  . Occupation: [name]  Social Needs  . Financial resource strain: Not on file  . Food insecurity:    Worry: Not on file    Inability: Not on file  . Transportation needs:    Medical: Not on file    Non-medical: Not on file  Tobacco Use  . Smoking status: Current Every Day Smoker    Packs/day: 1.00    Years: 33.00    Pack years: 33.00    Types: Cigarettes  . Smokeless tobacco: Never Used  Substance and Sexual Activity  . Alcohol use: No    Alcohol/week: 0.0 standard  drinks    Comment: Rarely  . Drug use: No  . Sexual activity: Yes    Birth control/protection: None  Lifestyle  . Physical activity:    Days per week: Not on file    Minutes per session: Not on file  . Stress: Not on file  Relationships  . Social connections:    Talks on phone: Not on file    Gets together: Not on file    Attends religious service: Not on file    Active member of club or organization: Not on file    Attends meetings of clubs or organizations: Not on file    Relationship status: Not on file  Other Topics Concern  . Not on file  Social History Narrative   Lives at home w/ his step-mother   Right-handed   Drinks about 2-3  Mt Dews a day   Past Surgical History:  Procedure Laterality Date  . Irrigation and debridement of back abscess    . NASAL SEPTOPLASTY W/ TURBINOPLASTY [name] 02/03/2018   Procedure: NASAL SEPTOPLASTY WITH TURBINATE REDUCTION;  Surgeon: [name], MD;  Location: [facility];  Service: ENT;  Laterality: [name];  . PILONIDAL CYST DRAINAGE [name] 04/10/2017   Procedure: IRRIGATION AND DEBRIDEMENT BACK ABSCESS;  Surgeon: [name], MD;  Location: [facility];  Service: General;  Laterality: [name];  . SINUS ENDO WITH FUSION [name] 02/03/2018   Procedure: ENDOSCOPIC SINUS SURGERY WITH FUSION;  Surgeon: [name], MD;  Location: [facility];  Service: ENT;  Laterality: [name];  . THORACIC OUTLET SURGERY  2000  . WRIST FUSION  2002   Past Medical History:  Diagnosis Date  . Anxiety   . Chronic back pain   . Degenerative disk disease   . Depression   . DVT of upper extremity (deep vein thrombosis) ([place])   . GERD (gastroesophageal reflux disease)   . Hypertension   . MRSA (methicillin resistant staph aureus) culture positive   . Peripheral neuropathy   . Pilonidal cyst   . Pulmonary embolus ([place])    no blood thinner 2002  . Sleep apnea    can't use cpap due to deviated nasal septumg   BP 138/86 (BP Location: Left Arm, Patient Position: Sitting)   Pulse 86   Resp 14   Ht 6\' 4"  (1.93 m)   Wt (!) 343 lb (155.6 kg)   SpO2 93%   BMI 41.75 kg/m   Opioid Risk Score:   Fall Risk Score:  `1  Depression screen PHQ 2/9  Depression screen [facility] 2/9 08/02/2018 07/14/2018 04/07/2018 03/08/2018 02/09/2018 11/11/2017 10/14/2017  Decreased Interest 1 3 1 1 2 1 1   Down, Depressed, Hopeless 1 3 1 1  0 1 1  PHQ - 2 Score 2 6 2 2 2 2 2   Altered sleeping - - 3 2 2 1 3   Tired, decreased energy - - 3 3 2 1 2   Change in appetite - - 2 2 2 2 1   Feeling bad or failure about yourself  - - 1 0 0 1 1  Trouble concentrating - - 3 2 3 2 2   Moving slowly or fidgety/restless - - 3 1 2 2 2   Suicidal thoughts - - 0 0 0 0 0  PHQ-9 Score - - 17  12 13 11 13   Difficult doing work/chores - - Very difficult Very difficult Somewhat difficult Somewhat difficult Very difficult  Some recent data might be hidden    Review of Systems  Constitutional: Positive for unexpected  weight change.  HENT: Negative.   Eyes: Negative.   Respiratory: Positive for apnea and shortness of breath.   Cardiovascular: Negative.   Gastrointestinal: Negative.   Endocrine: Negative.   Genitourinary: Positive for difficulty urinating.  Musculoskeletal: Positive for back pain.       Spasms   Allergic/Immunologic: Negative.   Neurological: Positive for weakness and numbness.       Tingling  Psychiatric/Behavioral: Positive for confusion and dysphoric mood. The patient is nervous/anxious.        Objective:   Physical Exam  Constitutional: He is oriented to person, place, and time. He appears well-developed.  Morbidly obese male in no acute distress  HENT:  Head: Normocephalic and atraumatic.  Eyes: Pupils are equal, round, and reactive to light. EOM are normal.  Neurological: He is alert and oriented to person, place, and time. Gait normal.  Skin: Skin is warm and dry.  Nursing note and vitals reviewed.   Decreased pp sensation to above knees, he does not feel any pinprick in the feet he feels normal sensation to pinprick above the wrists in the upper limbs. His motor strength is 5/5 bilateral hip flexor knee extensor ankle dorsiflexor and plantar flexor He has some tenderness over the Achilles tendon on the left side.  He has some midfoot tenderness on the dorsal surface on the left side as well. No evidence of joint swelling there is some mild pes planus bilaterally.       Assessment & Plan:  1.  Painful idiopathic axonal polyneuropathy.  Affecting sensory nerves but not motor nerves at this time We discussed keeping an eye on his skin on a daily basis to look for open lesions. Would continue Lyrica 300 mg twice daily as well as the oxycodone  7.5 4 times daily.  As discussed with patient I am unwilling to go any higher given his history in terms of a total morphine equivalent dose  PRAFO ordered for LLE comfort at night  Urine tx appropriate 08/02/18 Advised Bariatric surgery prior to any other surgery as this will help overall health and recovery status, reduce pain med use, help with sleep apnea

## 2018-09-12 ENCOUNTER — Telehealth: Payer: Self-pay | Admitting: *Deleted

## 2018-09-12 NOTE — Telephone Encounter (Signed)
Eduardo [name] needs his narcotic changed to fill on 11/29.  His pharmacy is not open on 11/30.

## 2018-09-12 NOTE — Telephone Encounter (Signed)
Placed a call to [facility], spoke with pharmacist. The pharmacy is closed 11/28, 11/29 and 11/30th, we will allow earlyrefill on 09/14/2018, due to the above. Placed a call to Eduardo [name] he verbalizes understanding.

## 2018-09-27 ENCOUNTER — Other Ambulatory Visit: Payer: Self-pay

## 2018-09-27 DIAGNOSIS — Z13220 Encounter for screening for lipoid disorders: Secondary | ICD-10-CM

## 2018-09-27 DIAGNOSIS — Z0000 Encounter for general adult medical examination without abnormal findings: Secondary | ICD-10-CM

## 2018-09-27 DIAGNOSIS — Z131 Encounter for screening for diabetes mellitus: Secondary | ICD-10-CM

## 2018-09-27 DIAGNOSIS — I10 Essential (primary) hypertension: Secondary | ICD-10-CM

## 2018-09-27 DIAGNOSIS — E876 Hypokalemia: Secondary | ICD-10-CM

## 2018-09-27 NOTE — Progress Notes (Signed)
Subjective:    Patient ID: Eduardo [name], male    DOB: 1971/02/26, 47 y.o.   MRN: [id number]  HPI:  Eduardo [name] is here for CPE He reports medication compliance, denies SE He has been reducing tobacco use, down to 1/2 pack/day He estimates to drink >90 oz water/day and has been reducing soda intake, now only 2 x 16 oz Mountain Dews/day He is quite active at work, denies any exercise outside work He requests need referral to Eduardo [name], re: Bariatric [name] He reports feeling worse after wearing CPAP at night- ?? He continues to experience neuropathy He reports stable mood, denies thoughts of harming himself/others He denies recent infections  Fasting labs obtained today  Now followed by [facility]Chronic Pain Management      Pulmonology- OSA      [facility]arty- foot pain      Urology- nocturnal enuresis   Healthcare Maintenance: Colonoscopy- [name] Immunizations-UTD  Patient Care Team    Relationship Specialty Notifications Start End  Eduardo Marble D, NP PCP - General Family Medicine  03/03/17   Eduardo Dresser, MD PCP - Cardiology Cardiology Admissions 07/18/18   Eduardo Kenner, MD  Dermatology  04/06/17   Eduardo Blake, MD Consulting Physician Physical Medicine and Rehabilitation  11/11/17     Patient Active Problem List   Diagnosis Date Noted  . Body mass index 40.0-44.9, adult (Eduardo [name]) 04/19/2018  . Pain in left foot 04/19/2018  . Shortness of breath 04/07/2018  . Lower extremity edema 04/07/2018  . Stasis dermatitis of both legs 04/07/2018  . Hypokalemia 04/07/2018  . Healthcare maintenance 04/07/2018  . Great toe pain, left 03/08/2018  . Acute gout involving toe of left foot 03/08/2018  . Low libido 02/09/2018  . OSA (obstructive sleep apnea) 02/03/2018  . Status post nasal septoplasty 02/03/2018  . Deviated septum 11/11/2017  . Umbilical hernia without obstruction and without gangrene 11/11/2017  . Scrotal pain 11/11/2017  . Scrotal cyst 11/11/2017   . Screening, lipid 11/11/2017  . Screening for diabetes mellitus (DM) 11/11/2017  . Screening for thyroid disorder 11/11/2017  . Anxiety and depression 08/11/2017  . Acute maxillary sinusitis 08/11/2017  . 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 Lumbar degenerative disc disease 08/02/2017  . Acute left ankle pain 07/12/2017  . Skin infection 06/03/2017  . Chronic folliculitis 44/81/8563  . [place] 04/10/2017  . Cellulitis and abscess of trunk s/p I&[name] 04/10/2017 04/06/2017  . Fever chills 04/06/2017  . History of MRSA infection 04/06/2017  . Physical exam, annual 03/03/2017  . Neuropathy 09/28/2015  . Major depression 12/26/2012  . Generalized anxiety disorder 12/26/2012  . Opiate dependence (Eduardo [name]) 12/24/2012  . Benzodiazepine dependence (Eduardo [name]) 12/24/2012  . HTN (hypertension) 12/24/2012  . 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 Morbid obesity ([place]) 09/09/2010  . TOBACCO ABUSE 09/09/2010  . LOW BACK PAIN, CHRONIC 09/09/2010     Past Medical History:  Diagnosis Date  . Anxiety   . Chronic back pain   . Degenerative disk disease   . Depression   . DVT of upper extremity (deep vein thrombosis) (Eduardo [name])   . GERD (gastroesophageal reflux disease)   . Hypertension   . MRSA (methicillin resistant staph aureus) culture positive   . Peripheral neuropathy   . Pilonidal cyst   . Pulmonary embolus (Eduardo [name])    no blood thinner 2002  . Sleep apnea    can't use cpap due to deviated nasal septumg     Past Surgical History:  Procedure Laterality  Date  . Irrigation and debridement of back abscess    . NASAL SEPTOPLASTY W/ TURBINOPLASTY [name] 02/03/2018   Procedure: NASAL SEPTOPLASTY WITH TURBINATE REDUCTION;  Surgeon: Eduardo Montane, MD;  Location: [facility];  Service: ENT;  Laterality: [name];  . PILONIDAL CYST DRAINAGE [name] 04/10/2017   Procedure: IRRIGATION AND DEBRIDEMENT BACK ABSCESS;  Surgeon: Eduardo Boston, MD;  Location[facility];  Service: General;  Laterality: [name];  . SINUS  ENDO WITH FUSION [name] 02/03/2018   Procedure: ENDOSCOPIC SINUS [name] WITH FUSION;  Surgeon: Eduardo Montane, MD;  Location: [facility];  Service: ENT;  Laterality: [name];  . THORACIC OUTLET [name]  2000  . WRIST FUSION  2002     Family History  Problem Relation Age of Onset  . Cancer Maternal Grandmother        breast, ovarian & colon  . Hypertension Mother   . Diabetes Mother   . Hypertension Father   . Heart disease Father   . Stroke Paternal Grandfather   . Neuropathy Neg Hx      Social History   Substance and Sexual Activity  Drug Use No     Social History   Substance and Sexual Activity  Alcohol Use No  . Alcohol/week: 0.0 standard drinks   Comment: Rarely     Social History   Tobacco Use  Smoking Status Current Every Day Smoker  . Packs/day: 1.00  . Years: 33.00  . Pack years: 33.00  . Types: Cigarettes  Smokeless Tobacco Never Used     Outpatient Encounter Medications as of 09/28/2018  Medication Sig Note  . albuterol (PROVENTIL HFA;VENTOLIN HFA) 108 (90 Base) MCG/ACT inhaler Inhale 2 puffs into the lungs every 6 (six) hours as needed for wheezing or shortness of breath.   [name] amLODipine (NORVASC) 2.5 MG tablet TAKE 1 TABLET (2.5 MG TOTAL) BY MOUTH DAILY.   [name] FLUoxetine (PROZAC) 20 MG capsule TAKE 1 CAPSULE BY MOUTH DAILY AFTER COMPLETION FIRST WEEK OF FLUOXETINE 10MG    . fluticasone (FLONASE) 50 MCG/ACT nasal spray SPRAY 2 SPRAYS IN EACH NOSTRIL DAILY   . hydrochlorothiazide (HYDRODIURIL) 25 MG tablet TAKE 1 TABLET (25 MG TOTAL) BY MOUTH 2 (TWO) TIMES DAILY.   [name] LYRICA 300 MG capsule TAKE 1 CAPSULE BY MOUTH TWICE A DAY   . montelukast (SINGULAIR) 10 MG tablet Take 1 tablet (10 mg total) by mouth at bedtime.   . Nutritional Supplements (JUICE PLUS FIBRE PO) Take 4 each by mouth daily with lunch. 01/24/2018: Fruit and vegatable  . oxyCODONE-acetaminophen (PERCOCET) 7.5-325 MG tablet Take 1 tablet by mouth every 6 (six) hours as needed.   . silver sulfADIAZINE  (SILVADENE) 1 % cream Apply 1 application topically daily.   . varenicline (CHANTIX) 1 MG tablet Take 1 tablet (1 mg total) by mouth 2 (two) times daily.   . [DISCONTINUED] fluticasone (FLONASE) 50 MCG/ACT nasal spray SPRAY 2 SPRAYS IN EACH NOSTRIL DAILY   . [DISCONTINUED] potassium chloride SA (K-DUR,KLOR-CON) 20 MEQ tablet Take 1 tablet (20 mEq total) by mouth daily.    No facility-administered encounter medications on file as of 09/28/2018.     Allergies: Pollen extract; Dust mite extract; and Other  Body mass index is 44.41 kg/m.  Blood pressure 121/74, pulse 82, temperature 98.7 F (37.1 C), temperature source Oral, height 6' 1.25" (1.861 m), weight (!) 338 lb 14.4 oz (153.7 kg), SpO2 94 %.  Review of Systems  Constitutional: Positive for fatigue. Negative for activity change, appetite change, chills, diaphoresis, fever and  unexpected weight change.  HENT: Negative for congestion.   Respiratory: Positive for shortness of breath. Negative for cough, chest tightness, wheezing and stridor.   Cardiovascular: Negative for chest pain, palpitations and leg swelling.  Gastrointestinal: Negative for abdominal distention, abdominal pain, blood in stool, constipation, diarrhea, nausea and vomiting.  Genitourinary: Positive for difficulty urinating and enuresis. Negative for dysuria.  Musculoskeletal: Positive for arthralgias, back pain, gait problem, joint swelling, myalgias, neck pain and neck stiffness.  Skin: Negative for color change, pallor, rash and wound.  Neurological: Negative for dizziness and headaches.  Hematological: Does not bruise/bleed easily.  Psychiatric/Behavioral: Positive for dysphoric mood and sleep disturbance. Negative for behavioral problems, confusion, decreased concentration, hallucinations, self-injury and suicidal ideas. The patient is nervous/anxious. The patient is not hyperactive.        Objective:   Physical Exam  Constitutional: He is oriented to person,  place, and time. He appears well-developed and well-nourished. No distress.  HENT:  Head: Normocephalic and atraumatic.  Right Ear: External ear normal. Tympanic membrane is not erythematous and not bulging. No decreased hearing is noted.  Left Ear: External ear normal. Tympanic membrane is not erythematous and not bulging. No decreased hearing is noted.  Nose: No mucosal edema or rhinorrhea. Right sinus exhibits no maxillary sinus tenderness and no frontal sinus tenderness. Left sinus exhibits no maxillary sinus tenderness and no frontal sinus tenderness.  Mouth/Throat: Oropharynx is clear and moist and mucous membranes are normal. Abnormal dentition. No posterior oropharyngeal edema or posterior oropharyngeal erythema. Tonsils are 0 on the right. Tonsils are 0 on the left.  Eyes: Pupils are equal, round, and reactive to light. Conjunctivae are normal.  Neck: Normal range of motion. Neck supple.  Cardiovascular: Normal rate, regular rhythm, normal heart sounds and intact distal pulses.  No murmur heard. Pulmonary/Chest: Effort normal and breath sounds normal. No stridor. No respiratory distress. He has no wheezes. He has no rales. He exhibits no tenderness.  Abdominal: Soft. Bowel sounds are normal. He exhibits no distension and no mass. There is no tenderness. There is no rebound and no guarding. No hernia.  Protuberant abdomen  Genitourinary:  Genitourinary Comments: Declined DRE/gential examination   Musculoskeletal: He exhibits tenderness.  Lymphadenopathy:    He has no cervical adenopathy.  Neurological: He is alert and oriented to person, place, and time. Coordination normal.  Skin: Skin is warm and dry. Capillary refill takes less than 2 seconds. No rash noted. He is not diaphoretic. No erythema. No pallor.  Psychiatric: His behavior is normal. Judgment and thought content normal. His affect is blunt. Cognition and memory are normal.  Blunt affect is his baseline   Nursing note and  vitals reviewed.     Assessment & Plan:   1. Morbid obesity ([place])   2. Healthcare maintenance   3. Screening, lipid   4. Screening for diabetes mellitus (DM)   5. Hypertension, unspecified type   6. Hypokalemia   7. TOBACCO ABUSE     Healthcare maintenance Continue all medications as directed. Increase water intake, strive for at least 100 ounces/day.   Follow Heart Healthy diet Increase regular exercise.  Recommend at least 30 minutes daily, 5 days per week of walking, jogging, biking, swimming, YouTube/Pinterest workout videos. Continue with various specialists as directed. Continue to reduce-stop tobacco use-YOU CAN DO IT! Referral placed for [facility] let us know if you need any additional assistance with referral. We will call you when lab results are available. Follow-up in 6 months.  TOBACCO ABUSE Currently smoking 1/2 pack/day  HTN (hypertension) BP at goal 121/74, HR 82  Morbid obesity ([facility]) Body mass index is 44.41 kg/m.  Current wt 338 Another referral to [name], re Bariatric [name] placed     FOLLOW-UP:  Return in about 6 months (around 03/30/2019) for Regular Follow Up.

## 2018-09-28 ENCOUNTER — Ambulatory Visit (INDEPENDENT_AMBULATORY_CARE_PROVIDER_SITE_OTHER): Payer: BC Managed Care – PPO | Admitting: Adult Health

## 2018-09-28 ENCOUNTER — Encounter: Payer: Self-pay | Admitting: Adult Health

## 2018-09-28 DIAGNOSIS — Z13220 Encounter for screening for lipoid disorders: Secondary | ICD-10-CM

## 2018-09-28 DIAGNOSIS — Z131 Encounter for screening for diabetes mellitus: Secondary | ICD-10-CM

## 2018-09-28 DIAGNOSIS — Z0000 Encounter for general adult medical examination without abnormal findings: Secondary | ICD-10-CM | POA: Diagnosis not present

## 2018-09-28 DIAGNOSIS — I10 Essential (primary) hypertension: Secondary | ICD-10-CM

## 2018-09-28 DIAGNOSIS — E876 Hypokalemia: Secondary | ICD-10-CM

## 2018-09-28 DIAGNOSIS — F17200 Nicotine dependence, unspecified, uncomplicated: Secondary | ICD-10-CM

## 2018-09-28 MED ORDER — FLUTICASONE PROPIONATE 50 MCG/ACT NA SUSP: NASAL | 6 refills | Status: DC

## 2018-09-28 NOTE — Assessment & Plan Note (Signed)
Continue all medications as directed. Increase water intake, strive for at least 100 ounces/day.   Follow Heart Healthy diet Increase regular exercise.  Recommend at least 30 minutes daily, 5 days per week of walking, jogging, biking, swimming, YouTube/Pinterest workout videos. Continue with various specialists as directed. Continue to reduce-stop tobacco use-YOU CAN DO IT! Referral placed for [facility] let us know if you need any additional assistance with referral. We will call you when lab results are available. Follow-up in 6 months.

## 2018-09-28 NOTE — Assessment & Plan Note (Signed)
Currently smoking 1/2 pack/day

## 2018-09-28 NOTE — Patient Instructions (Signed)

## 2018-09-28 NOTE — Assessment & Plan Note (Signed)
BP at goal 121/74, HR 82

## 2018-09-28 NOTE — Assessment & Plan Note (Signed)
Body mass index is 44.41 kg/m.  Current wt 338 Another referral to [facility], re Bariatric Surgery placed

## 2018-09-29 ENCOUNTER — Other Ambulatory Visit: Payer: Self-pay | Admitting: Adult Health

## 2018-09-29 LAB — COMPREHENSIVE METABOLIC PANEL
ALT: 18 IU/L (ref 0–44)
AST: 19 IU/L (ref 0–40)
Albumin/Globulin Ratio: 1.4 (ref 1.2–2.2)
Albumin: 4.3 g/dL (ref 3.5–5.5)
Alkaline Phosphatase: 104 IU/L (ref 39–117)
BUN/Creatinine Ratio: 14 (ref 9–20)
BUN: 17 mg/dL (ref 6–24)
Bilirubin Total: 0.4 mg/dL (ref 0.0–1.2)
CO2: 27 mmol/L (ref 20–29)
Calcium: 9.3 mg/dL (ref 8.7–10.2)
Chloride: 98 mmol/L (ref 96–106)
Creatinine, Ser: 1.25 mg/dL (ref 0.76–1.27)
GFR calc Af Amer: 78 mL/min/{1.73_m2} (ref >59)
GFR calc non Af Amer: 67 mL/min/{1.73_m2} (ref >59)
Globulin, Total: 3 g/dL (ref 1.5–4.5)
Glucose: 157 mg/dL — ABNORMAL HIGH (ref 65–99)
Potassium: 3.4 mmol/L — ABNORMAL LOW (ref 3.5–5.2)
Sodium: 141 mmol/L (ref 134–144)
Total Protein: 7.3 g/dL (ref 6.0–8.5)

## 2018-09-29 LAB — CBC WITH DIFFERENTIAL/PLATELET
Basophils Absolute: 0.1 10*3/uL (ref 0.0–0.2)
Basos: 1 %
EOS (ABSOLUTE): 0.3 10*3/uL (ref 0.0–0.4)
Eos: 3 %
Hematocrit: 45.7 % (ref 37.5–51.0)
Hemoglobin: 15.5 g/dL (ref 13.0–17.7)
Immature Grans (Abs): 0.1 10*3/uL (ref 0.0–0.1)
Immature Granulocytes: 1 %
Lymphocytes Absolute: 2.4 10*3/uL (ref 0.7–3.1)
Lymphs: 23 %
MCH: 30.3 pg (ref 26.6–33.0)
MCHC: 33.9 g/dL (ref 31.5–35.7)
MCV: 89 fL (ref 79–97)
Monocytes Absolute: 0.5 10*3/uL (ref 0.1–0.9)
Monocytes: 5 %
Neutrophils Absolute: 6.8 10*3/uL (ref 1.4–7.0)
Neutrophils: 67 %
Platelets: 219 10*3/uL (ref 150–450)
RBC: 5.12 x10E6/uL (ref 4.14–5.80)
RDW: 15.4 % (ref 12.3–15.4)
WBC: 10.2 10*3/uL (ref 3.4–10.8)

## 2018-09-29 LAB — LIPID PANEL
Chol/HDL Ratio: 4.8 ratio (ref 0.0–5.0)
Cholesterol, Total: 119 mg/dL (ref 100–199)
HDL: 25 mg/dL — ABNORMAL LOW (ref >39)
LDL Calculated: 39 mg/dL (ref 0–99)
Triglycerides: 276 mg/dL — ABNORMAL HIGH (ref 0–149)
VLDL Cholesterol Cal: 55 mg/dL — ABNORMAL HIGH (ref 5–40)

## 2018-09-29 LAB — HEMOGLOBIN A1C
Est. average glucose Bld gHb Est-mCnc: 94 mg/dL
Hgb A1c MFr Bld: 4.9 % (ref 4.8–5.6)

## 2018-09-29 LAB — TSH: TSH: 0.687 u[IU]/mL (ref 0.450–4.500)

## 2018-09-29 MED ORDER — HYDROCHLOROTHIAZIDE 25 MG PO TABS: 25.0000 mg | ORAL_TABLET | Freq: Every day | ORAL | 0 refills | Status: DC

## 2018-10-03 ENCOUNTER — Encounter: Payer: Self-pay | Admitting: Registered Nurse

## 2018-10-03 ENCOUNTER — Encounter: Payer: BC Managed Care – PPO | Attending: Physical Medicine & Rehabilitation | Admitting: Registered Nurse

## 2018-10-03 VITALS — BP 138/77 | HR 82 | Resp 14 | Ht 74.0 in | Wt 338.0 lb

## 2018-10-03 DIAGNOSIS — M5416 Radiculopathy, lumbar region: Secondary | ICD-10-CM

## 2018-10-03 DIAGNOSIS — M5136 Other intervertebral disc degeneration, lumbar region: Secondary | ICD-10-CM | POA: Diagnosis not present

## 2018-10-03 DIAGNOSIS — G609 Hereditary and idiopathic neuropathy, unspecified: Secondary | ICD-10-CM

## 2018-10-03 DIAGNOSIS — M79672 Pain in left foot: Secondary | ICD-10-CM | POA: Diagnosis not present

## 2018-10-03 DIAGNOSIS — Z5181 Encounter for therapeutic drug level monitoring: Secondary | ICD-10-CM | POA: Diagnosis not present

## 2018-10-03 DIAGNOSIS — M21611 Bunion of right foot: Secondary | ICD-10-CM | POA: Diagnosis not present

## 2018-10-03 DIAGNOSIS — G608 Other hereditary and idiopathic neuropathies: Secondary | ICD-10-CM | POA: Diagnosis not present

## 2018-10-03 DIAGNOSIS — Z79891 Long term (current) use of opiate analgesic: Secondary | ICD-10-CM

## 2018-10-03 DIAGNOSIS — M21612 Bunion of left foot: Secondary | ICD-10-CM | POA: Diagnosis not present

## 2018-10-03 DIAGNOSIS — G894 Chronic pain syndrome: Secondary | ICD-10-CM | POA: Diagnosis present

## 2018-10-03 DIAGNOSIS — M79671 Pain in right foot: Secondary | ICD-10-CM | POA: Diagnosis not present

## 2018-10-03 MED ORDER — OXYCODONE-ACETAMINOPHEN 7.5-325 MG PO TABS: 1.0000 | ORAL_TABLET | Freq: Four times a day (QID) | ORAL | 0 refills | Status: DC | PRN

## 2018-10-03 NOTE — Progress Notes (Signed)
Subjective:    Patient ID: Eduardo [name], male    DOB: Mar 20, 1971, 47 y.o.   MRN: [id number]  HPI: Eduardo [name] is a 47 y.o. male who returns for follow up appointment for chronic pain and medication refill. He states his  pain is located in his lower back radiating into his left lower extremity. He rates his pain 7. His current exercise regime is walking.  Eduardo [name] equivalent is 45.00 MME. Last UDS was performed on 08/02/2018, it was consistent.   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 General activity 7 Relation with others 7 Enjoyment of life 9 What TIME of day is your pain at its worst? morning, daytime, evening, night Sleep (in general) Poor  Pain is worse with: walking, sitting, inactivity and standing Pain improves with: rest and medication Relief from Meds: 6  Mobility walk without assistance how many minutes can you walk? 5 ability to climb steps?  yes do you drive?  yes Do you have any goals in this area?  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  no  Physicians involved in your care Any changes since last visit?  no   Family History  Problem Relation Age of Onset  . Cancer Maternal Grandmother        breast, ovarian & colon  . Hypertension Mother   . Diabetes Mother   . Hypertension Father   . Heart disease Father   . Stroke Paternal Grandfather   . Neuropathy Neg Hx    Social History   Socioeconomic History  . Marital status: Divorced    Spouse name: Not on file  . Number of children: 2  . Years of  education: 12th   . Highest education level: Not on file  Occupational History  . Occupation: [name]  Social Needs  . Financial resource strain: Not on file  . Food insecurity:    Worry: Not on file    Inability: Not on file  . Transportation needs:    Medical: Not on file    Non-medical: Not on file  Tobacco Use  . Smoking status: Current Every Day Smoker    Packs/day: 1.00    Years: 33.00    Pack years: 33.00    Types: Cigarettes  . Smokeless tobacco: Never Used  Substance and Sexual Activity  . Alcohol use: No    Alcohol/week: 0.0 standard drinks    Comment: Rarely  . Drug use: No  . Sexual activity: Yes    Birth control/protection: None  Lifestyle  . Physical activity:    Days per week: Not on file    Minutes per session: Not on file  . Stress: Not on file  Relationships  . Social connections:    Talks on phone: Not on file    Gets together: Not on file    Attends religious service: Not on file    Active member of club or organization: Not on file    Attends meetings of clubs or organizations: Not on file    Relationship status: Not on file  Other Topics Concern  .  Not on file  Social History Narrative   Lives at home w/ his step-mother   Right-handed   Drinks about 2-3 Mt Dews a day   Past Surgical History:  Procedure Laterality Date  . Irrigation and debridement of back abscess    . NASAL SEPTOPLASTY W/ TURBINOPLASTY [name] 02/03/2018   Procedure: NASAL SEPTOPLASTY WITH TURBINATE REDUCTION;  Surgeon: [name], MD;  Location: [facility];  Service: ENT;  Laterality: [name];  . PILONIDAL CYST DRAINAGE [name] 04/10/2017   Procedure: IRRIGATION AND DEBRIDEMENT BACK ABSCESS;  Surgeon: [name], MD;  Location: [facility];  Service: General;  Laterality: [name];  . SINUS ENDO WITH FUSION [name] 02/03/2018   Procedure: ENDOSCOPIC SINUS SURGERY WITH FUSION;  Surgeon: [name], MD;  Location: [facility] [place];  Service: ENT;  Laterality: [name];  . THORACIC OUTLET SURGERY  2000  . WRIST FUSION   2002   Past Medical History:  Diagnosis Date  . Anxiety   . Chronic back pain   . Degenerative disk disease   . Depression   . DVT of upper extremity (deep vein thrombosis) ([place])   . GERD (gastroesophageal reflux disease)   . Hypertension   . MRSA (methicillin resistant staph aureus) culture positive   . Peripheral neuropathy   . Pilonidal cyst   . Pulmonary embolus ([place])    no blood thinner 2002  . Sleep apnea    can't use cpap due to deviated nasal septumg   Ht 6\' 2"  (1.88 m)   Wt (!) 338 lb (153.3 kg)   BMI 43.40 kg/m   Opioid Risk Score:   Fall Risk Score:  `1  Depression screen PHQ 2/9  Depression screen [facility] 2/9 09/28/2018 08/02/2018 07/14/2018 04/07/2018 03/08/2018 02/09/2018 11/11/2017  Decreased Interest 1 1 3 1 1 2 1   Down, Depressed, Hopeless 1 1 3 1 1  0 1  PHQ - 2 Score 2 2 6 2 2 2 2   Altered sleeping 3 - - 3 2 2 1   Tired, decreased energy 3 - - 3 3 2 1   Change in appetite 2 - - 2 2 2 2   Feeling bad or failure about yourself  1 - - 1 0 0 1  Trouble concentrating 3 - - 3 2 3 2   Moving slowly or fidgety/restless 1 - - 3 1 2 2   Suicidal thoughts 0 - - 0 0 0 0  PHQ-9 Score 15 - - 17 12 13 11   Difficult doing work/chores Somewhat difficult - - Very difficult Very difficult Somewhat difficult Somewhat difficult  Some recent data might be hidden    Review of Systems  Constitutional: Negative.   HENT: Negative.   Eyes: Negative.   Respiratory: Negative.   Cardiovascular: Negative.   Gastrointestinal: Negative.   Endocrine: Negative.   Genitourinary: Negative.   Musculoskeletal: Positive for back pain.  Skin: Negative.   Allergic/Immunologic: Negative.   Neurological: Negative.   Hematological: Negative.   Psychiatric/Behavioral: Positive for confusion and dysphoric mood. The patient is nervous/anxious.   All other systems reviewed and are negative.      Objective:   Physical Exam Vitals signs and nursing note reviewed.  Constitutional:      Comments:  Morbid Obesity  Neck:     Musculoskeletal: Normal range of motion and neck supple.  Cardiovascular:     Rate and Rhythm: Normal rate and regular rhythm.     Pulses: Normal pulses.     Heart sounds: Normal heart sounds.  Pulmonary:  Effort: Pulmonary effort is normal.     Breath sounds: Normal breath sounds.  Musculoskeletal:     Comments: Normal Muscle Bulk and Muscle Testing Reveals:  Upper Extremities: Full ROM and Muscle Strength 5/5 Lumbar Paraspinal Tenderness: L-4-L-5 Lower Extremities: Full ROM and Muscle Strength 5/5 Arises from Table with Ease Narrow Based  Gait   Skin:    General: Skin is warm and dry.  Neurological:     Mental Status: He is alert and oriented to person, place, and time.  Psychiatric:        Mood and Affect: Mood normal.        Behavior: Behavior normal.           Assessment & Plan:  1. Chronic idiopathic axonal polyneuropathy: Continue Lyrica. 10/03/2018. 2. Posterior tibial tendinitis of left lower extremity: Continue HEP as Tolerated. Continue to Monitor. 10/03/2018 3. Lumbar DDD/ Lumbar Radiculitis: Continue current medication regime and HEP as Tolerated. 10/03/2018. 4. Chronic Pain Syndrome: Refilled : Oxycodone 7.5/325mg  one tablet 4 times daily as needed for pain.  Dr, Letta Pate note was reviewed, he's unwilling to go any higher given his history in terms of total [name] equivalent dose.  10/03/2018. We will continue the opioid monitoring program, this consists of regular clinic visits, examinations, urine drug screen, pill counts as well as use of [place] Controlled Substance Reporting system.  92minutes of face to face patient care time was spent during this visit. All questions were encouraged and answered.  F/U in 1 month

## 2018-10-13 ENCOUNTER — Telehealth: Payer: Self-pay

## 2018-11-08 ENCOUNTER — Encounter: Payer: Self-pay | Admitting: Registered Nurse

## 2018-11-08 ENCOUNTER — Encounter: Payer: BC Managed Care – PPO | Attending: Physical Medicine & Rehabilitation | Admitting: Registered Nurse

## 2018-11-08 VITALS — BP 144/82 | HR 84 | Resp 14 | Ht 74.0 in | Wt 338.0 lb

## 2018-11-08 DIAGNOSIS — M5416 Radiculopathy, lumbar region: Secondary | ICD-10-CM | POA: Diagnosis not present

## 2018-11-08 DIAGNOSIS — M21611 Bunion of right foot: Secondary | ICD-10-CM | POA: Diagnosis not present

## 2018-11-08 DIAGNOSIS — M21612 Bunion of left foot: Secondary | ICD-10-CM | POA: Insufficient documentation

## 2018-11-08 DIAGNOSIS — G894 Chronic pain syndrome: Secondary | ICD-10-CM

## 2018-11-08 DIAGNOSIS — G609 Hereditary and idiopathic neuropathy, unspecified: Secondary | ICD-10-CM

## 2018-11-08 DIAGNOSIS — Z5181 Encounter for therapeutic drug level monitoring: Secondary | ICD-10-CM | POA: Diagnosis not present

## 2018-11-08 DIAGNOSIS — G608 Other hereditary and idiopathic neuropathies: Secondary | ICD-10-CM | POA: Diagnosis not present

## 2018-11-08 DIAGNOSIS — Z79891 Long term (current) use of opiate analgesic: Secondary | ICD-10-CM

## 2018-11-08 DIAGNOSIS — M79671 Pain in right foot: Secondary | ICD-10-CM | POA: Insufficient documentation

## 2018-11-08 DIAGNOSIS — M5136 Other intervertebral disc degeneration, lumbar region: Secondary | ICD-10-CM

## 2018-11-08 DIAGNOSIS — M51369 Other intervertebral disc degeneration, lumbar region without mention of lumbar back pain or lower extremity pain: Secondary | ICD-10-CM

## 2018-11-08 DIAGNOSIS — M79672 Pain in left foot: Secondary | ICD-10-CM | POA: Diagnosis not present

## 2018-11-08 DIAGNOSIS — E538 Deficiency of other specified B group vitamins: Secondary | ICD-10-CM

## 2018-11-08 MED ORDER — PREGABALIN 300 MG PO CAPS: 300.0000 mg | ORAL_CAPSULE | Freq: Two times a day (BID) | ORAL | 2 refills | Status: DC

## 2018-11-08 MED ORDER — OXYCODONE-ACETAMINOPHEN 7.5-325 MG PO TABS: 1.0000 | ORAL_TABLET | Freq: Four times a day (QID) | ORAL | 0 refills | Status: DC | PRN

## 2018-11-08 NOTE — Progress Notes (Signed)
Subjective:    Patient ID: Eduardo [name], male    DOB: 1971-07-11, 48 y.o.   MRN: [id number]  HPI: Eduardo [name] is a 48 y.o. male who returns for follow up appointment for chronic pain and medication refill. He states his pain is located in his lower back radiating into his bilateral lower extremities and bilateral feet. He rates his  Pain 6. His current exercise regime is walking and performing stretching exercises.  Mr. Lamay Morphine equivalent is 45.00 MME.  Last UDS was Performed on 08/02/2018, it was consistent. UDS ordered today.    Pain Inventory Average Pain 8 Pain Right Now 6 My pain is sharp, burning, stabbing, tingling and aching  In the last 24 hours, has pain interfered with the following? General activity 6 Relation with others 7 Enjoyment of life 9 What TIME of day is your pain at its worst? all Sleep (in general) Poor  Pain is worse with: walking, inactivity and standing Pain improves with: rest and medication Relief from Meds: 8  Mobility walk without assistance ability to climb steps?  yes do you drive?  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  no  Physicians involved in your care Any changes since last visit?  no   Family History  Problem Relation Age of Onset  . Cancer Maternal Grandmother        breast, ovarian & colon  . Hypertension Mother   . Diabetes Mother   . Hypertension Father   . Heart disease Father   . Stroke Paternal Grandfather   . Neuropathy Neg Hx    Social History   Socioeconomic History  . Marital status: Divorced    Spouse name: Not on file  . Number of children: 2  . Years of education: 12th   . Highest education level: Not on file  Occupational History  . Occupation: [name]  Social Needs  . Financial resource strain: Not  on file  . Food insecurity:    Worry: Not on file    Inability: Not on file  . Transportation needs:    Medical: Not on file    Non-medical: Not on file  Tobacco Use  . Smoking status: Current Every Day Smoker    Packs/day: 1.00    Years: 33.00    Pack years: 33.00    Types: Cigarettes  . Smokeless tobacco: Never Used  Substance and Sexual Activity  . Alcohol use: No    Alcohol/week: 0.0 standard drinks    Comment: Rarely  . Drug use: No  . Sexual activity: Yes    Birth control/protection: None  Lifestyle  . Physical activity:    Days per week: Not on file    Minutes per session: Not on file  . Stress: Not on file  Relationships  . Social connections:    Talks on phone: Not on file    Gets together: Not on file    Attends religious service: Not on file    Active member of club or organization: Not on file    Attends meetings of clubs or organizations: Not on file    Relationship status: Not on file  Other Topics Concern  . Not on file  Social History Narrative   Lives at home w/ his step-mother   Right-handed   Drinks about 2-3 Mt Dews a day  Past Surgical History:  Procedure Laterality Date  . Irrigation and debridement of back abscess    . NASAL SEPTOPLASTY W/ TURBINOPLASTY [name] 02/03/2018   Procedure: NASAL SEPTOPLASTY WITH TURBINATE REDUCTION;  Surgeon: [name], MD;  Location: [place];  Service: ENT;  Laterality: [name];  . PILONIDAL CYST DRAINAGE [name] 04/10/2017   Procedure: IRRIGATION AND DEBRIDEMENT BACK ABSCESS;  Surgeon: [name], MD;  Location[facility];  Service: General;  Laterality: [name];  . SINUS ENDO WITH FUSION [name] 02/03/2018   Procedure: ENDOSCOPIC SINUS SURGERY WITH FUSION;  Surgeon: [name], MD;  Location: [place];  Service: ENT;  Laterality: [name];  . THORACIC OUTLET SURGERY  2000  . WRIST FUSION  2002   Past Medical History:  Diagnosis Date  . Anxiety   . Chronic back pain   . Degenerative disk disease   . Depression   . DVT of upper  extremity (deep vein thrombosis) ([place])   . GERD (gastroesophageal reflux disease)   . Hypertension   . MRSA (methicillin resistant staph aureus) culture positive   . Peripheral neuropathy   . Pilonidal cyst   . Pulmonary embolus ([place])    no blood thinner 2002  . Sleep apnea    can't use cpap due to deviated nasal septumg   BP (!) 144/82   Pulse 84   Resp 14   SpO2 96%   Opioid Risk Score:   Fall Risk Score:  `1  Depression screen PHQ 2/9  Depression screen [facility] 2/9 09/28/2018 08/02/2018 07/14/2018 04/07/2018 03/08/2018 02/09/2018 11/11/2017  Decreased Interest 1 1 3 1 1 2 1   Down, Depressed, Hopeless 1 1 3 1 1  0 1  PHQ - 2 Score 2 2 6 2 2 2 2   Altered sleeping 3 - - 3 2 2 1   Tired, decreased energy 3 - - 3 3 2 1   Change in appetite 2 - - 2 2 2 2   Feeling bad or failure about yourself  1 - - 1 0 0 1  Trouble concentrating 3 - - 3 2 3 2   Moving slowly or fidgety/restless 1 - - 3 1 2 2   Suicidal thoughts 0 - - 0 0 0 0  PHQ-9 Score 15 - - 17 12 13 11   Difficult doing work/chores Somewhat difficult - - Very difficult Very difficult Somewhat difficult Somewhat difficult  Some recent data might be hidden    Review of Systems  Constitutional: Negative.   HENT: Negative.   Eyes: Negative.   Respiratory: Negative.   Cardiovascular: Negative.   Gastrointestinal: Negative.   Endocrine: Negative.   Genitourinary: Negative.   Musculoskeletal: Positive for arthralgias, back pain and gait problem.       Spasms   Skin: Negative.   Allergic/Immunologic: Negative.   Neurological: Positive for numbness.       Tingling  Hematological: Negative.   Psychiatric/Behavioral: Positive for dysphoric mood. The patient is nervous/anxious.   All other systems reviewed and are negative.      Objective:   Physical Exam Vitals signs and nursing note reviewed.  Constitutional:      Appearance: Normal appearance.  Neck:     Musculoskeletal: Normal range of motion and neck supple.    Cardiovascular:     Rate and Rhythm: Normal rate and regular rhythm.     Pulses: Normal pulses.     Heart sounds: Normal heart sounds.  Pulmonary:     Effort: Pulmonary effort is normal.     Breath sounds: Normal  breath sounds.  Musculoskeletal:     Comments: Normal Muscle Bulk and Muscle Testing Reveals:  Upper Extremities: Full ROM and Muscle Strength 5/5  Lumbar Paraspinal Tenderness: L-3-L-5 Lower Extremities: Full ROM and Muscle Strength 5/5 Arises from Table with ease Narrow Based Gait   Skin:    General: Skin is warm and dry.  Neurological:     Mental Status: He is alert and oriented to person, place, and time.  Psychiatric:        Behavior: Behavior normal.           Assessment & Plan:  1. Chronic idiopathic axonal polyneuropathy: Continue Lyrica. 11/08/2018. 2. Posterior tibial tendinitis of left lower extremity: Continue HEP as Tolerated. Continue to Monitor.11/08/2018 3. Lumbar DDD/ Lumbar Radiculitis: Continue current medication regime and HEP as Tolerated.11/08/2018 4. Chronic Pain Syndrome:Refilled : Oxycodone 7.5/325mg  one tablet 4 times daily as needed for pain.  Per Dr, Letta Pate note  he's unwilling to go any higher given his history in terms of total morphine equivalent dose.  11/08/2018 We will continue the opioid monitoring program, this consists of regular clinic visits, examinations, urine drug screen, pill counts as well as use of [place] Controlled Substance Reporting system.  25minutes of face to face patient care time was spent during this visit. All questions were encouraged and answered.  F/U in 1 month

## 2018-11-09 NOTE — Telephone Encounter (Signed)
-----   Message from Martyn Ehrich, NP sent at 10/10/2018  3:43 PM EST ----- Regarding: sleep study pull download Pull down load please  ----- Message ----- From: Martyn Ehrich, NP Sent: 08/21/2018   1:51 PM EST To: Martyn Ehrich, NP  cpap down load - needs tubing fixed

## 2018-11-09 NOTE — Telephone Encounter (Signed)
Printed out CPAP download.  Nothing further needed at this time.

## 2018-11-09 NOTE — Telephone Encounter (Signed)
Patient has not been wearing CPAP, did he get tubing fixed? Last worn in November. Please encourage that he wears every night for 4 hours.

## 2018-11-12 LAB — TOXASSURE SELECT,+ANTIDEPR,UR

## 2018-11-15 ENCOUNTER — Telehealth: Payer: Self-pay | Admitting: *Deleted

## 2018-11-15 NOTE — Telephone Encounter (Signed)
Spoke with pt, he states he is not using it because he doesn't feel its doing him any good and he feels more tired on it. He is not going to continue to use CPAP. FYI BW.

## 2018-11-15 NOTE — Telephone Encounter (Signed)
He has an apt with Dr. Elsworth Soho on Jan 30th, please make sure he is aware and they can discuss this further. Thanks

## 2018-11-15 NOTE — Telephone Encounter (Signed)
Urine drug screen for this encounter is consistent for prescribed medication 

## 2018-11-16 NOTE — Telephone Encounter (Signed)
You got it.

## 2018-11-17 ENCOUNTER — Ambulatory Visit (INDEPENDENT_AMBULATORY_CARE_PROVIDER_SITE_OTHER): Payer: BC Managed Care – PPO | Admitting: Pulmonary Disease

## 2018-11-17 ENCOUNTER — Encounter: Payer: Self-pay | Admitting: Pulmonary Disease

## 2018-11-17 DIAGNOSIS — G4733 Obstructive sleep apnea (adult) (pediatric): Secondary | ICD-10-CM

## 2018-11-17 DIAGNOSIS — F17200 Nicotine dependence, unspecified, uncomplicated: Secondary | ICD-10-CM | POA: Diagnosis not present

## 2018-11-17 NOTE — Assessment & Plan Note (Signed)
Schedule CPAP titration study Unfortunately he is not able to tolerate full facemask due to claustrophobia and did not tolerate nasal pillows either. He took mask desensitization but this did not work. Hope is that titration study would give [place] a longer time to find the right mask for him  Due to severe OSA, he is not a candidate for dental appliance and due to obesity, inspire device would be contraindicated  In the long run weight loss might be his only option, I encouraged him to contact our bariatric surgery program

## 2018-11-17 NOTE — Patient Instructions (Signed)
Schedule CPAP titration study  We discussed smoking cessation with Chantix

## 2018-11-17 NOTE — Progress Notes (Signed)
   Subjective:    Patient ID: Eduardo [name], male    DOB: 04-06-1971, 48 y.o.   MRN: [id number]  HPI  48 year old obese smoker for follow-up of severe OSA He underwent septoplasty, was found to have chronic pansinusitis also underwent nasal endoscopy cleaning  Chief Complaint  Patient presents with  . Follow-up    OSA. States he is not currently using a CPAP machine. States he feels worse after using cpap machine. Has not used machine in the past few weeks.     He could not tolerate a full facemask due to severe claustrophobia.  We set him up for mask desensitization with nasal pillows and trialed him on auto CPAP 5 to 15 cm with nasal pillows.  He was initially somewhat compliant but feels that CPAP did not help him at all in fact he felt worse using the machine and without it His weight is unchanged.  He continues to smoke about a pack per day.  He has been given a prescription for Chantix which he is taking for 4 weeks but still has been unable to cut down on his cigarettes  Significant tests/ events reviewed  NPSG 11/2017  severe OSA with AHI of 83/h and was desaturation of 87%  Spirometry 04/2018  ratio of 83, and with FVC of 71% suggestive of mild restriction  Review of Systems neg for any significant sore throat, dysphagia, itching, sneezing, nasal congestion or excess/ purulent secretions, fever, chills, sweats, unintended wt loss, pleuritic or exertional cp, hempoptysis, orthopnea pnd or change in chronic leg swelling. Also denies presyncope, palpitations, heartburn, abdominal pain, nausea, vomiting, diarrhea or change in bowel or urinary habits, dysuria,hematuria, rash, arthralgias, visual complaints, headache, numbness weakness or ataxia.     Objective:   Physical Exam   Gen. Pleasant, obese, in no distress ENT - no lesions, no post nasal drip Neck: No JVD, no thyromegaly, no carotid bruits Lungs: no use of accessory muscles, no dullness to percussion, decreased  without rales or rhonchi  Cardiovascular: Rhythm regular, heart sounds  normal, no murmurs or gallops, 1+ peripheral edema Musculoskeletal: No deformities, no cyanosis or clubbing , no tremors        Assessment & Plan:

## 2018-11-17 NOTE — Assessment & Plan Note (Signed)
We discussed smoking cessation with Chantix We discussed not smoking in certain areas and setting a quit date

## 2018-11-30 ENCOUNTER — Other Ambulatory Visit: Payer: Self-pay | Admitting: Adult Health

## 2018-11-30 MED ORDER — FLUTICASONE PROPIONATE 50 MCG/ACT NA SUSP: NASAL | 6 refills | Status: DC

## 2018-11-30 MED ORDER — VARENICLINE TARTRATE 1 MG PO TABS: 1.0000 mg | ORAL_TABLET | Freq: Two times a day (BID) | ORAL | 2 refills | Status: DC

## 2018-11-30 NOTE — Telephone Encounter (Signed)
Pt is also requesting nicotine patches.  Please review and authorize if appropriate.  Charyl Bigger, CMA

## 2018-11-30 NOTE — Telephone Encounter (Signed)
Patient called to request refill on these Rxs :   1)--- varenicline (CHANTIX) 1 MG tablet [[id number]]   Order Details  Dose: 1 mg Route: Oral Frequency: 2 times daily  Indications of Use: Smoking Cessation Therapy  Dispense Quantity: 60 tablet Refills: 2 Fills remaining: --        Sig: Take 1 tablet (1 mg total) by mouth 2 (two) times daily.          2)---- fluticasone (FLONASE) 50 MCG/ACT nasal spray [[id number]]   Order Details  Dose, Route, Frequency: As Directed Note to Pharmacy:  PT WOULD LIKE A 3 MONTH RX AUTHORIZED PLEASE    Dispense Quantity: 16 g Refills: 6 Fills remaining: --        Sig: SPRAY 2 SPRAYS IN EACH NOSTRIL DAILY     &  ---Patient also wishes nicotine patch .  Forwarding message to refill order to:  [place], [place] [phone] (Phone) [phone] (Fax)   --Dion Body

## 2018-12-01 ENCOUNTER — Other Ambulatory Visit: Payer: Self-pay

## 2018-12-01 ENCOUNTER — Other Ambulatory Visit: Payer: Self-pay | Admitting: Adult Health

## 2018-12-01 MED ORDER — MONTELUKAST SODIUM 10 MG PO TABS: 10.0000 mg | ORAL_TABLET | Freq: Every day | ORAL | 3 refills | Status: DC

## 2018-12-08 ENCOUNTER — Encounter: Payer: BC Managed Care – PPO | Admitting: Registered Nurse

## 2018-12-08 ENCOUNTER — Telehealth: Payer: Self-pay | Admitting: Registered Nurse

## 2018-12-08 DIAGNOSIS — G609 Hereditary and idiopathic neuropathy, unspecified: Secondary | ICD-10-CM

## 2018-12-08 DIAGNOSIS — E538 Deficiency of other specified B group vitamins: Secondary | ICD-10-CM

## 2018-12-08 MED ORDER — PREGABALIN 300 MG PO CAPS: 300.0000 mg | ORAL_CAPSULE | Freq: Two times a day (BID) | ORAL | 2 refills | Status: DC

## 2018-12-08 MED ORDER — OXYCODONE-ACETAMINOPHEN 7.5-325 MG PO TABS: 1.0000 | ORAL_TABLET | Freq: Four times a day (QID) | ORAL | 0 refills | Status: DC | PRN

## 2018-12-08 NOTE — Telephone Encounter (Signed)
Eduardo [name] appointment change due to weather change. Analgesic e-scribe.

## 2018-12-14 ENCOUNTER — Other Ambulatory Visit: Payer: Self-pay

## 2018-12-14 ENCOUNTER — Encounter: Payer: BC Managed Care – PPO | Attending: Physical Medicine & Rehabilitation | Admitting: Registered Nurse

## 2018-12-14 ENCOUNTER — Encounter: Payer: Self-pay | Admitting: Registered Nurse

## 2018-12-14 VITALS — BP 126/76 | HR 81 | Ht 76.0 in | Wt 364.8 lb

## 2018-12-14 DIAGNOSIS — G894 Chronic pain syndrome: Secondary | ICD-10-CM | POA: Diagnosis not present

## 2018-12-14 DIAGNOSIS — M79672 Pain in left foot: Secondary | ICD-10-CM | POA: Insufficient documentation

## 2018-12-14 DIAGNOSIS — M5136 Other intervertebral disc degeneration, lumbar region: Secondary | ICD-10-CM | POA: Diagnosis not present

## 2018-12-14 DIAGNOSIS — G608 Other hereditary and idiopathic neuropathies: Secondary | ICD-10-CM | POA: Diagnosis not present

## 2018-12-14 DIAGNOSIS — M21612 Bunion of left foot: Secondary | ICD-10-CM | POA: Insufficient documentation

## 2018-12-14 DIAGNOSIS — M79671 Pain in right foot: Secondary | ICD-10-CM | POA: Insufficient documentation

## 2018-12-14 DIAGNOSIS — M5416 Radiculopathy, lumbar region: Secondary | ICD-10-CM | POA: Diagnosis not present

## 2018-12-14 DIAGNOSIS — G609 Hereditary and idiopathic neuropathy, unspecified: Secondary | ICD-10-CM | POA: Diagnosis not present

## 2018-12-14 DIAGNOSIS — M21611 Bunion of right foot: Secondary | ICD-10-CM | POA: Insufficient documentation

## 2018-12-14 DIAGNOSIS — Z79899 Other long term (current) drug therapy: Secondary | ICD-10-CM

## 2018-12-14 DIAGNOSIS — Z5181 Encounter for therapeutic drug level monitoring: Secondary | ICD-10-CM

## 2018-12-14 NOTE — Progress Notes (Signed)
Subjective:    Patient ID: Eduardo [name], male    DOB: [birth year], 48 y.o.   MRN: [id number]  HPI: Eduardo [name] is a 48 y.o. male who returns for follow up appointment for chronic pain and medication refill. He states his pain is located in his lower back radiating into his bilateral lower extremities and left foot. He rates his pain 7. His current exercise regime is walking.  Eduardo [name] Morphine equivalent is 45.00 MME.    Last UDS was Performed on 11/08/2018, it was consistent.   Pain Inventory Average Pain 9 Pain Right Now 7 My pain is sharp, burning, stabbing, tingling and aching  In the last 24 hours, has pain interfered with the following? General activity 8 Relation with others 7 Enjoyment of life 10 What TIME of day is your pain at its worst? morning daytime night Sleep (in general) Poor  Pain is worse with: walking, bending, sitting, inactivity and standing Pain improves with: rest and medication Relief from Meds: 7  Mobility walk without assistance how many minutes can you walk? short distance ability to climb steps?  yes do you drive?  yes  Function employed # of hrs/week 40 or less  Neuro/Psych bladder control problems weakness numbness tingling trouble walking confusion depression anxiety  Prior Studies Any changes since last visit?  no  Physicians involved in your care Any changes since last visit?  no   Family History  Problem Relation Age of Onset  . Cancer Maternal Grandmother        breast, ovarian & colon  . Hypertension Mother   . Diabetes Mother   . Hypertension Father   . Heart disease Father   . Stroke Paternal Grandfather   . Neuropathy Neg Hx    Social History   Socioeconomic History  . Marital status: Divorced    Spouse name: Not on file  . Number of children: 2  . Years of education: 12th   . Highest education level: Not on file  Occupational History  . Occupation: [name]  Social Needs  . Financial  resource strain: Not on file  . Food insecurity:    Worry: Not on file    Inability: Not on file  . Transportation needs:    Medical: Not on file    Non-medical: Not on file  Tobacco Use  . Smoking status: Current Every Day Smoker    Packs/day: 1.00    Years: 33.00    Pack years: 33.00    Types: Cigarettes  . Smokeless tobacco: Never Used  Substance and Sexual Activity  . Alcohol use: No    Alcohol/week: 0.0 standard drinks    Comment: Rarely  . Drug use: No  . Sexual activity: Yes    Birth control/protection: None  Lifestyle  . Physical activity:    Days per week: Not on file    Minutes per session: Not on file  . Stress: Not on file  Relationships  . Social connections:    Talks on phone: Not on file    Gets together: Not on file    Attends religious service: Not on file    Active member of club or organization: Not on file    Attends meetings of clubs or organizations: Not on file    Relationship status: Not on file  Other Topics Concern  . Not on file  Social History Narrative   Lives at home w/ his step-mother   Right-handed   Drinks about 2-3 [place]  [place] a day   Past Surgical History:  Procedure Laterality Date  . Irrigation and debridement of back abscess    . NASAL SEPTOPLASTY W/ TURBINOPLASTY [name] 02/03/2018   Procedure: NASAL SEPTOPLASTY WITH TURBINATE REDUCTION;  Surgeon: [name], MD;  Location: [place];  Service: ENT;  Laterality: [name];  . PILONIDAL CYST DRAINAGE [name] 04/10/2017   Procedure: IRRIGATION AND DEBRIDEMENT BACK ABSCESS;  Surgeon: [name], MD;  [facility];  Service: General;  Laterality: [name];  . SINUS ENDO WITH FUSION [name] 02/03/2018   Procedure: ENDOSCOPIC SINUS SURGERY WITH FUSION;  Surgeon: [name], MD;  Location: [place];  Service: ENT;  Laterality: [name];  . THORACIC OUTLET SURGERY  2000  . WRIST FUSION  2002   Past Medical History:  Diagnosis Date  . Anxiety   . Chronic back pain   . Degenerative disk disease   . Depression     . DVT of upper extremity (deep vein thrombosis) ([place])   . GERD (gastroesophageal reflux disease)   . Hypertension   . MRSA (methicillin resistant staph aureus) culture positive   . Peripheral neuropathy   . Pilonidal cyst   . Pulmonary embolus ([place])    no blood thinner 2002  . Sleep apnea    can't use cpap due to deviated nasal septumg   BP 126/76   Pulse 81   Ht 6\' 4"  (1.93 m)   Wt (!) 364 lb 12.8 oz (165.5 kg)   SpO2 90%   BMI 44.40 kg/m   Opioid Risk Score:   Fall Risk Score:  `1  Depression screen PHQ 2/9  Depression screen [facility] 2/9 12/14/2018 09/28/2018 08/02/2018 07/14/2018 04/07/2018 03/08/2018 02/09/2018  Decreased Interest 1 1 1 3 1 1 2   Down, Depressed, Hopeless 1 1 1 3 1 1  0  PHQ - 2 Score 2 2 2 6 2 2 2   Altered sleeping - 3 - - 3 2 2   Tired, decreased energy - 3 - - 3 3 2   Change in appetite - 2 - - 2 2 2   Feeling bad or failure about yourself  - 1 - - 1 0 0  Trouble concentrating - 3 - - 3 2 3   Moving slowly or fidgety/restless - 1 - - 3 1 2   Suicidal thoughts - 0 - - 0 0 0  PHQ-9 Score - 15 - - 17 12 13   Difficult doing work/chores - Somewhat difficult - - Very difficult Very difficult Somewhat difficult  Some recent data might be hidden    Review of Systems  Constitutional: Positive for unexpected weight change.  HENT: Negative.   Eyes: Negative.   Respiratory: Positive for apnea and shortness of breath.   Gastrointestinal: Negative.   Endocrine: Negative.   Genitourinary: Negative.   Musculoskeletal: Positive for back pain and gait problem.  Skin: Positive for rash.  Allergic/Immunologic: Negative.   Neurological: Positive for weakness and numbness.       Tingling  Psychiatric/Behavioral: Positive for confusion and dysphoric mood. The patient is nervous/anxious.   All other systems reviewed and are negative.      Objective:   Physical Exam Vitals signs and nursing note reviewed.  Constitutional:      Appearance: Normal appearance.  Neck:      Musculoskeletal: Normal range of motion and neck supple.  Cardiovascular:     Rate and Rhythm: Normal rate and regular rhythm.  Pulmonary:     Effort: Pulmonary effort is normal.     Breath  sounds: Normal breath sounds.  Musculoskeletal:     Comments: Normal Muscle Bulk and Muscle Testing Reveals:  Upper Extremities:Full  ROM and Muscle Strength 5/5 Lumbar Paraspinal Tenderness: L-3-L-5 Lower Extremities: Full ROM and Muscle Strength 5/5 Arises from Table with ease Narrow Based Gait   Skin:    General: Skin is warm and dry.  Neurological:     Mental Status: He is alert and oriented to person, place, and time.  Psychiatric:        Mood and Affect: Mood normal.        Behavior: Behavior normal.           Assessment & Plan:  1. Chronic idiopathic axonal polyneuropathy: Continue Lyrica. 12/14/2018. 2. Posterior tibial tendinitis of left lower extremity: Continue HEP as Tolerated. Continue to Monitor.12/14/2018 3. Lumbar DDD/ Lumbar Radiculitis: Continue current medication regime and HEP as Tolerated.12/14/2018 4. Chronic Pain Syndrome:Refilled: Oxycodone7.5/325mg  one tablet4 times daily as needed for pain. Per Dr, Letta Pate note  he'sunwilling to go any higher given his history in terms of total morphine equivalent dose. 12/14/2018 We will continue the opioid monitoring program, this consists of regular clinic visits, examinations, urine drug screen, pill counts as well as use of [place] Controlled Substance Reporting system.  84minutes of face to face patient care time was spent during this visit. All questions were encouraged and answered.  F/U in 1 month

## 2019-01-01 ENCOUNTER — Encounter (HOSPITAL_BASED_OUTPATIENT_CLINIC_OR_DEPARTMENT_OTHER): Payer: Self-pay

## 2019-01-03 ENCOUNTER — Encounter: Payer: BC Managed Care – PPO | Attending: Physical Medicine & Rehabilitation | Admitting: Registered Nurse

## 2019-01-03 ENCOUNTER — Other Ambulatory Visit: Payer: Self-pay | Admitting: Adult Health

## 2019-01-03 ENCOUNTER — Other Ambulatory Visit: Payer: Self-pay

## 2019-01-03 ENCOUNTER — Encounter: Payer: Self-pay | Admitting: Registered Nurse

## 2019-01-03 VITALS — BP 160/90 | HR 87 | Ht 76.0 in | Wt 360.0 lb

## 2019-01-03 DIAGNOSIS — G894 Chronic pain syndrome: Secondary | ICD-10-CM

## 2019-01-03 DIAGNOSIS — R251 Tremor, unspecified: Secondary | ICD-10-CM

## 2019-01-03 DIAGNOSIS — Z5181 Encounter for therapeutic drug level monitoring: Secondary | ICD-10-CM | POA: Diagnosis not present

## 2019-01-03 DIAGNOSIS — M21612 Bunion of left foot: Secondary | ICD-10-CM | POA: Insufficient documentation

## 2019-01-03 DIAGNOSIS — M79671 Pain in right foot: Secondary | ICD-10-CM | POA: Diagnosis not present

## 2019-01-03 DIAGNOSIS — M79672 Pain in left foot: Secondary | ICD-10-CM | POA: Insufficient documentation

## 2019-01-03 DIAGNOSIS — M5136 Other intervertebral disc degeneration, lumbar region: Secondary | ICD-10-CM | POA: Diagnosis not present

## 2019-01-03 DIAGNOSIS — Z79899 Other long term (current) drug therapy: Secondary | ICD-10-CM

## 2019-01-03 DIAGNOSIS — M21611 Bunion of right foot: Secondary | ICD-10-CM | POA: Insufficient documentation

## 2019-01-03 DIAGNOSIS — G608 Other hereditary and idiopathic neuropathies: Secondary | ICD-10-CM | POA: Diagnosis not present

## 2019-01-03 DIAGNOSIS — M5416 Radiculopathy, lumbar region: Secondary | ICD-10-CM | POA: Diagnosis not present

## 2019-01-03 DIAGNOSIS — M51369 Other intervertebral disc degeneration, lumbar region without mention of lumbar back pain or lower extremity pain: Secondary | ICD-10-CM

## 2019-01-03 MED ORDER — OXYCODONE-ACETAMINOPHEN 7.5-325 MG PO TABS: 1.0000 | ORAL_TABLET | Freq: Four times a day (QID) | ORAL | 0 refills | Status: DC | PRN

## 2019-01-03 NOTE — Progress Notes (Signed)
Subjective:    Patient ID: Eduardo [name], male    DOB: 1971-04-08, 48 y.o.   MRN: [id number]  HPI: Eduardo [name] is a 48 y.o. male who returns for follow up appointment for chronic pain and medication refill. He states his  pain is located in his lower back radiating into his bilateral lower extremities and bilateral feet with tingling and burning. He rates his pain 8. His current exercise regime is walking.  Eduardo [name] reports increase frequency of tremors at [facility], at this time no termor noted, he would like to see neurologist. Referral placed to neurology.   Eduardo [name]  Morphine equivalent is 45.00 MME.  Last UDS was Performed on 11/08/2018, it was consistent.   Pain Inventory Average Pain 9 Pain Right Now 8 My pain is constant, sharp, burning, stabbing, tingling and aching  In the last 24 hours, has pain interfered with the following? General activity 8 Relation with others 9 Enjoyment of life 10 What TIME of day is your pain at its worst? all Sleep (in general) Poor  Pain is worse with: walking, bending, sitting, inactivity and standing Pain improves with: rest and medication Relief from Meds: 7  Mobility walk without assistance how many minutes can you walk? 3 ability to climb steps?  yes do you drive?  yes  Function what is your job? 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  no   Family History  Problem Relation Age of Onset   Cancer Maternal Grandmother        breast, ovarian & colon   Hypertension Mother    Diabetes Mother    Hypertension Father    Heart disease Father    Stroke Paternal Grandfather    Neuropathy Neg Hx    Social History   Socioeconomic History    Marital status: Divorced    Spouse name: Not on file   Number of children: 2   Years of education: 12th    Highest education level: Not on file  Occupational History   Occupation: [name]  Social Designer, fashion/clothing strain: Not on file   Food insecurity:    Worry: Not on file    Inability: Not on file   Transportation needs:    Medical: Not on file    Non-medical: Not on file  Tobacco Use   Smoking status: Current Every Day Smoker    Packs/day: 1.00    Years: 33.00    Pack years: 33.00    Types: Cigarettes   Smokeless tobacco: Never Used  Substance and Sexual Activity   Alcohol use: No    Alcohol/week: 0.0 standard drinks    Comment: Rarely   Drug use: No   Sexual activity: Yes    Birth control/protection: None  Lifestyle   Physical activity:    Days per week: Not on file    Minutes per session: Not on file   Stress: Not on file  Relationships   Social connections:    Talks on phone: Not on file    Gets together: Not on file    Attends religious service: Not on file    Active member of club or organization: Not on file    Attends meetings of clubs or organizations: Not on file    Relationship  status: Not on file  Other Topics Concern   Not on file  Social History Narrative   Lives at home w/ his step-mother   Right-handed   Drinks about 2-3 [place] a day   Past Surgical History:  Procedure Laterality Date   Irrigation and debridement of back abscess     NASAL SEPTOPLASTY W/ TURBINOPLASTY [name] 02/03/2018   Procedure: NASAL SEPTOPLASTY WITH TURBINATE REDUCTION;  Surgeon: [name], MD;  Location: [place];  Service: ENT;  Laterality: [name];   PILONIDAL CYST DRAINAGE [name] 04/10/2017   Procedure: IRRIGATION AND DEBRIDEMENT BACK ABSCESS;  Surgeon: [name], MD;  [facility];  Service: General;  Laterality: [name];   SINUS ENDO WITH FUSION [name] 02/03/2018   Procedure: ENDOSCOPIC SINUS SURGERY WITH FUSION;  Surgeon: [name], MD;  [facility];  Service: ENT;  Laterality: [name];   THORACIC OUTLET SURGERY  2000   WRIST FUSION  2002   Past Medical History:  Diagnosis Date   Anxiety    Chronic back pain    Degenerative disk disease    Depression    DVT of upper extremity (deep vein thrombosis) ([facility])    GERD (gastroesophageal reflux disease)    Hypertension    MRSA (methicillin resistant staph aureus) culture positive    Peripheral neuropathy    Pilonidal cyst    Pulmonary embolus ([place])    no blood thinner 2002   Sleep apnea    can't use cpap due to deviated nasal septumg   BP (!) 160/90    Pulse 87    Ht 6\' 4"  (1.93 m)    Wt (!) 360 lb (163.3 kg)    SpO2 93%    BMI 43.82 kg/m   Opioid Risk Score:   Fall Risk Score:  `1  Depression screen PHQ 2/9  Depression screen [facility] 2/9 01/03/2019 12/14/2018 09/28/2018 08/02/2018 07/14/2018 04/07/2018 03/08/2018  Decreased Interest 1 1 1 1 3 1 1   Down, Depressed, Hopeless 1 1 1 1 3 1 1   PHQ - 2 Score 2 2 2 2 6 2 2   Altered sleeping - - 3 - - 3 2  Tired, decreased energy - - 3 - - 3 3  Change in appetite - - 2 - - 2 2  Feeling bad or failure about yourself  - - 1 - - 1 0  Trouble concentrating - - 3 - - 3 2  Moving slowly or fidgety/restless - - 1 - - 3 1  Suicidal thoughts - - 0 - - 0 0  PHQ-9 Score - - 15 - - 17 12  Difficult doing work/chores - - Somewhat difficult - - Very difficult Very difficult  Some recent data might be hidden    Review of Systems  Constitutional: Negative.   HENT: Negative.   Eyes: Negative.   Respiratory: Positive for apnea and shortness of breath.   Cardiovascular: Negative.   Gastrointestinal: Negative.   Endocrine: Negative.   Genitourinary: Negative.   Musculoskeletal: Positive for gait problem.  Skin: Negative.   Allergic/Immunologic: Negative.   Neurological: Positive for tremors, weakness and numbness.  Hematological: Negative.   Psychiatric/Behavioral: Positive for dysphoric mood. The patient is nervous/anxious.   All  other systems reviewed and are negative.      Objective:   Physical Exam Vitals signs and nursing note reviewed.  Constitutional:      Appearance: Normal appearance.  Neck:     Musculoskeletal: Normal range of motion and neck supple.  Cardiovascular:     Rate and Rhythm: Normal rate and regular rhythm.     Pulses: Normal pulses.     Heart sounds: Normal heart sounds.  Pulmonary:     Effort: Pulmonary effort is normal.     Breath sounds: Normal breath sounds.  Musculoskeletal:     Comments: Normal Muscle Bulk and Muscle Testing Reveals:  Upper Extremities: Full ROM and Muscle Strength 5/5  Lumbar Paraspinal Tenderness: L-3-L-5 Lower Extremities: Full ROM and Muscle Strength 5/5 Arises from Table with ease Narrow Based Gait   Skin:    General: Skin is warm and dry.  Neurological:     Mental Status: He is alert and oriented to person, place, and time.  Psychiatric:        Mood and Affect: Mood normal.        Behavior: Behavior normal.           Assessment & Plan:  1. Chronic idiopathic axonal polyneuropathy: Continue Lyrica.01/03/2019. 2. Posterior tibial tendinitis of left lower extremity: Continue HEP as Tolerated. Continue to Monitor.01/03/2019 3. Lumbar DDD/ Lumbar Radiculitis: Continue current medication regime and HEP as Tolerated.01/03/2019 4. Chronic Pain Syndrome:Refilled: Oxycodone7.5/325mg  one tablet4 times daily as needed for pain. PerDr, Kirsteins note he'sunwilling to go any higher given his history in terms of total morphine equivalent dose.01/03/2019 We will continue the opioid monitoring program, this consists of regular clinic visits, examinations, urine drug screen, pill counts as well as use of [place] Controlled Substance Reporting system. 5. Tremors: RX: Neurology.   61minutes of face to face patient care time was spent during this visit. All questions were encouraged and answered.  F/U in 1 month

## 2019-01-05 ENCOUNTER — Other Ambulatory Visit (HOSPITAL_COMMUNITY): Payer: Self-pay | Admitting: Surgery

## 2019-01-14 IMAGING — DX DG ANKLE COMPLETE 3+V*L*
3 series · 3 of 3 positions shown · non-contrast
Comparison: None.

CLINICAL DATA: Ankle pain

EXAM:
LEFT ANKLE COMPLETE - 3+ VIEW

[ankle ap]
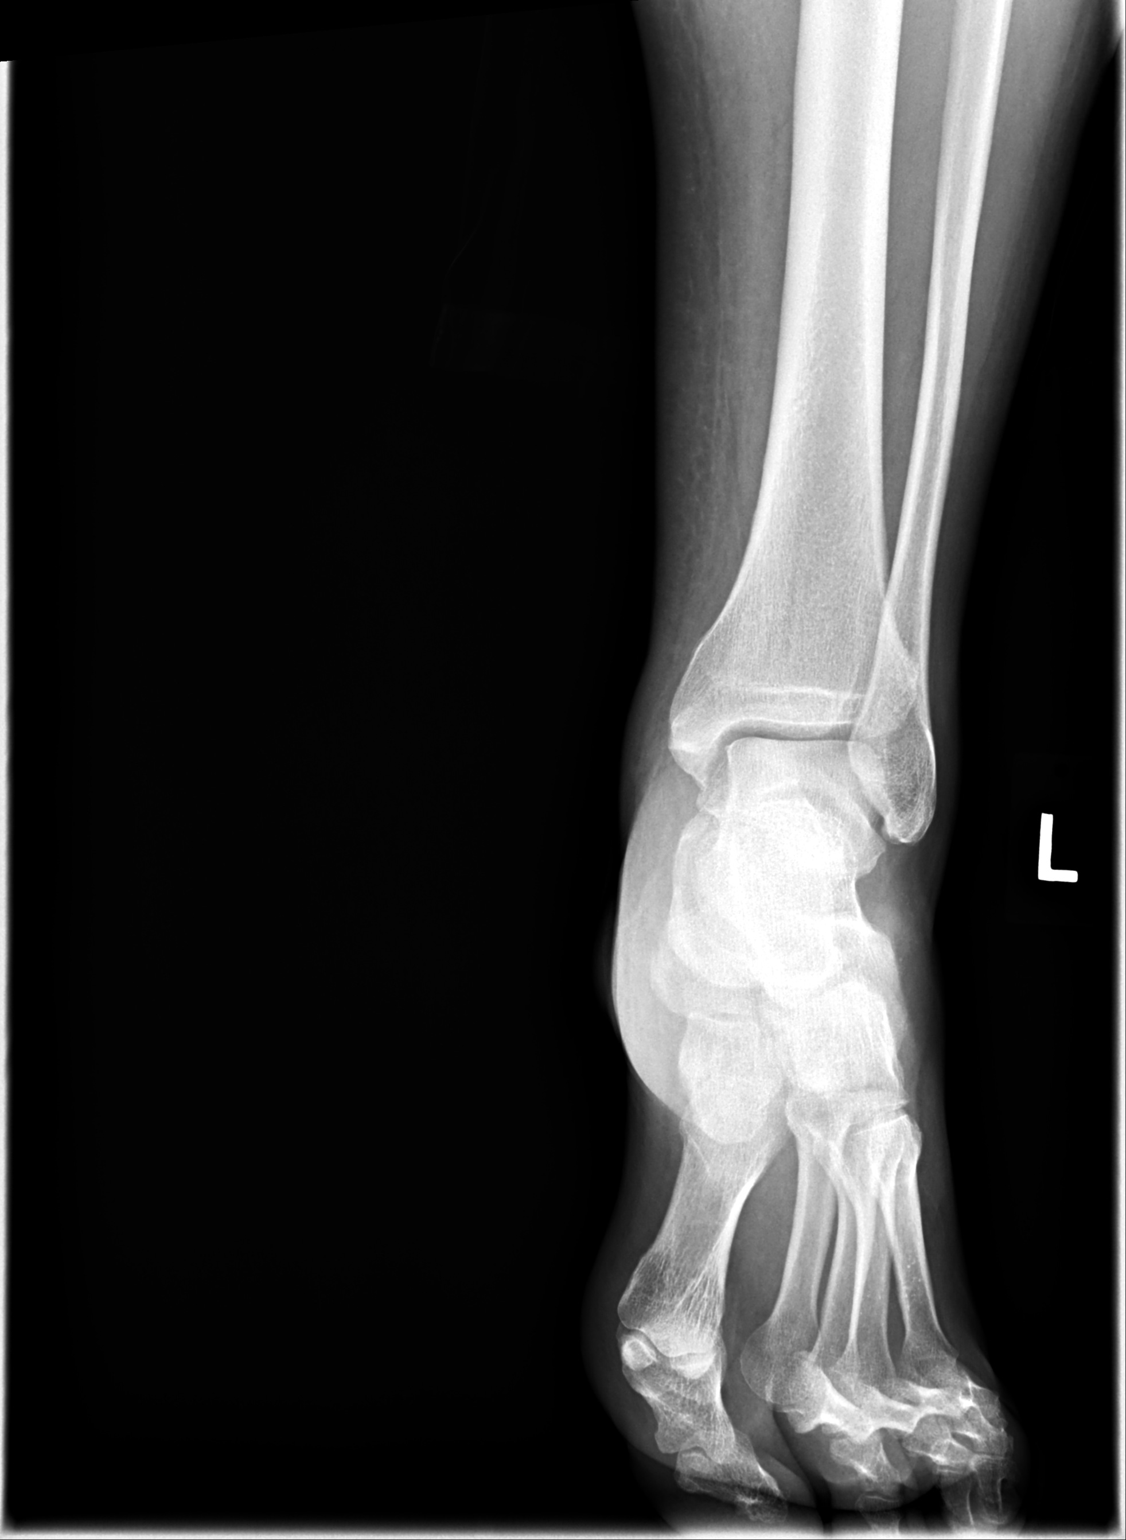

[ankle oblique]
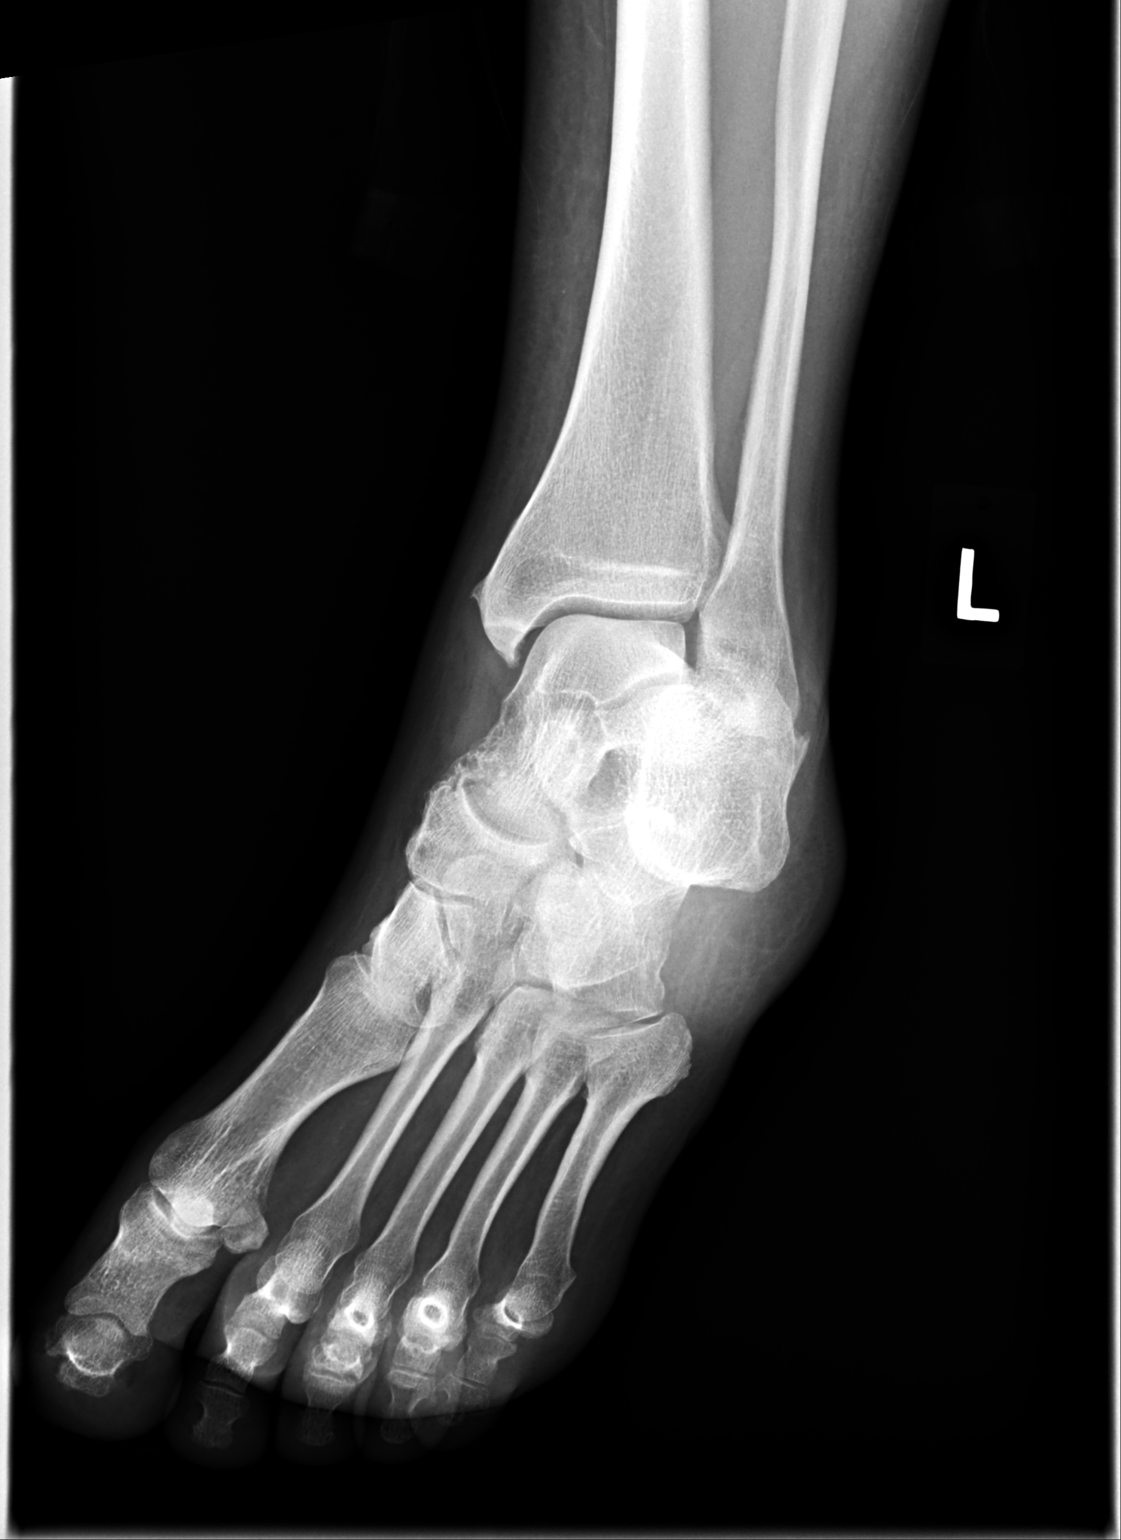

[ankle lat]
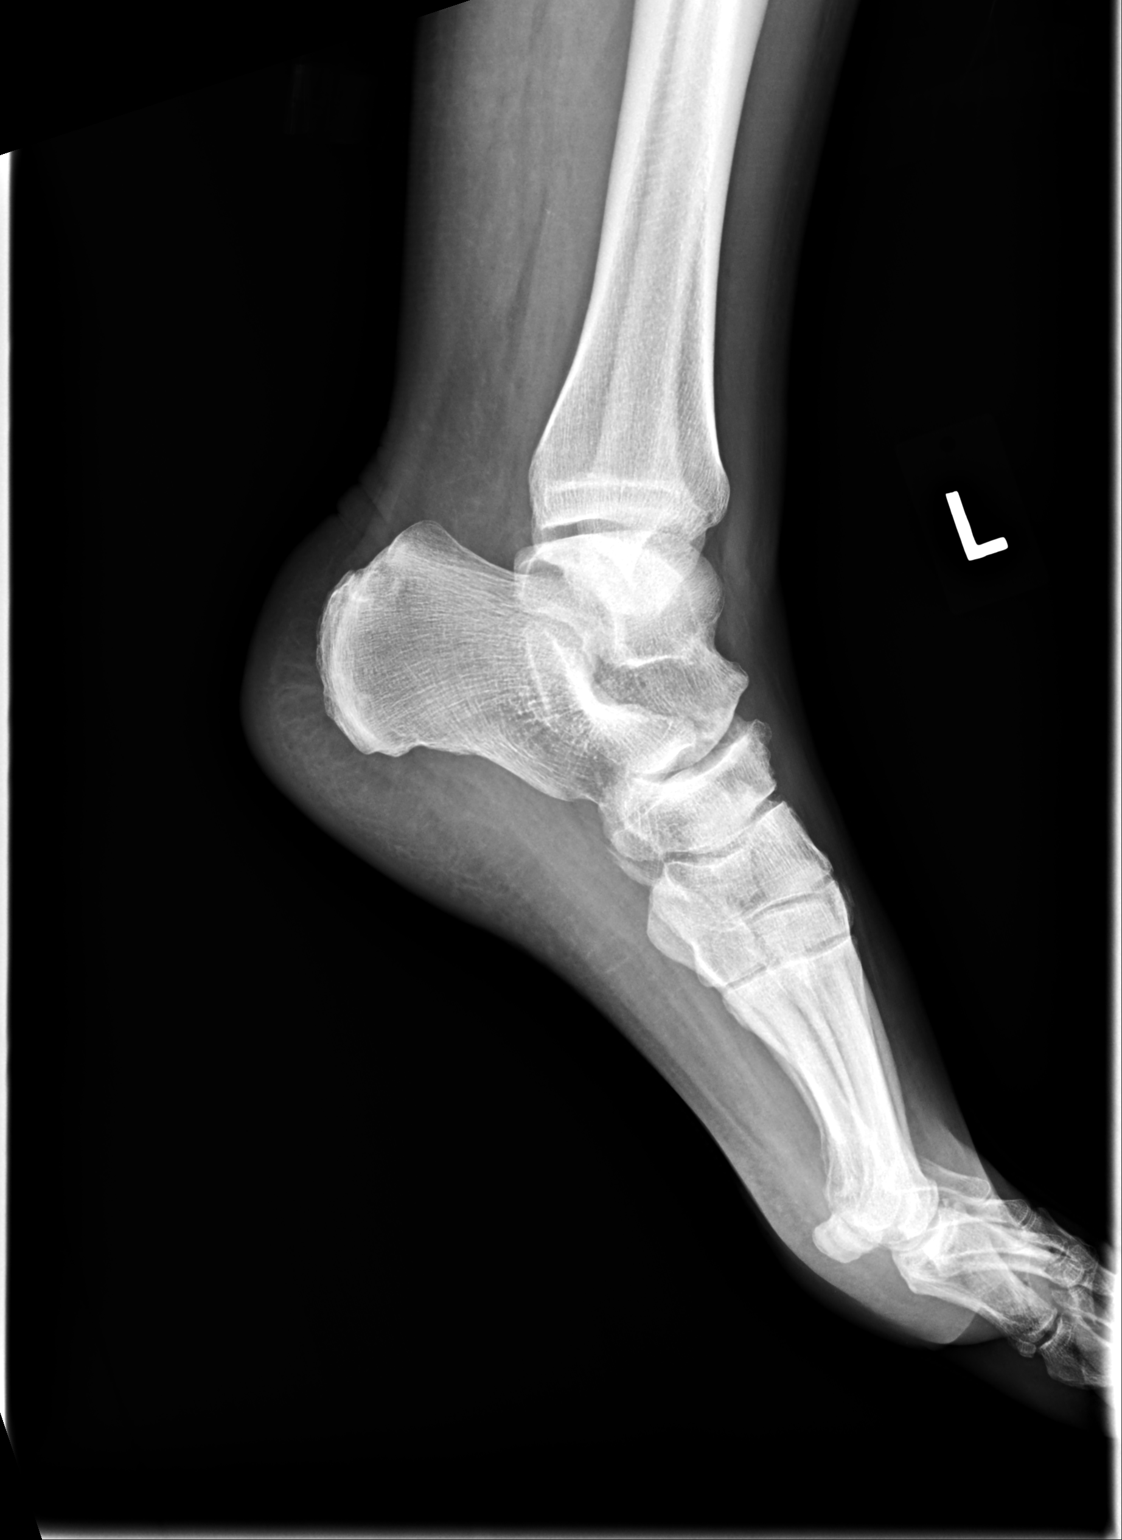

[3 of 3 positions shown; findings below may reference images not displayed]

FINDINGS: Normal alignment no fracture. Ankle joint normal. Degenerative
change at the talonavicular joint with joint space widening and
spurring
IMPRESSION: Degenerative changes talonavicular joint.  Negative for fracture..

## 2019-01-24 ENCOUNTER — Ambulatory Visit (INDEPENDENT_AMBULATORY_CARE_PROVIDER_SITE_OTHER): Payer: BC Managed Care – PPO | Admitting: Adult Health

## 2019-01-24 ENCOUNTER — Encounter: Payer: Self-pay | Admitting: Adult Health

## 2019-01-24 ENCOUNTER — Telehealth: Payer: Self-pay | Admitting: *Deleted

## 2019-01-24 ENCOUNTER — Other Ambulatory Visit: Payer: Self-pay

## 2019-01-24 DIAGNOSIS — L089 Local infection of the skin and subcutaneous tissue, unspecified: Secondary | ICD-10-CM

## 2019-01-24 NOTE — Assessment & Plan Note (Signed)
Assessment and Plan: Unable to treat abscess/infection until I can visualize. Sent pt MyChart email and text Signup instructions, once he sets up and send in photo- I can complete tx Likely will treat with Doxcycline due to +MRA hx  Follow Up Instructions: PRN   I discussed the assessment and treatment plan with the patient. The patient was provided an opportunity to ask questions and all were answered. The patient agreed with the plan and demonstrated an understanding of the instructions.   The patient was advised to call back or seek an in-person evaluation if the symptoms worsen or if the condition fails to improve as anticipated.

## 2019-01-24 NOTE — Telephone Encounter (Signed)
Eduardo [name] called about some problem with his medication.  His phone was breaking up so badly I could not make out what he was saying.  No answer on home or mobile number. I left message to call us back.

## 2019-01-24 NOTE — Progress Notes (Addendum)
Virtual Visit via Telephone Note  I connected with Eduardo [name] on 01/24/19 at  2:00 PM EDT by telephone and verified that I am speaking with the correct person using two identifiers.   I discussed the limitations, risks, security and privacy concerns of performing an evaluation and management service by telephone and the availability of in person appointments. The staff discussed with the patient that there may be a patient responsible charge related to this service. The patient expressed understanding and agreed to proceed.   History of Present Illness: Eduardo [name] calls in today with re-current skin infections. He reports 3 quarter sized "boild that are hard and painful" developed January 01, 2019. He reports that the boil above his anus ruptured and drained for 3 days, rupture estimated approx 15 days ago.  He reports drainage was yellow/red in color and was malodorous. He reports soreness at sites, rated 5/10 He denies fever/night sweats/chills/N/V/D He has hx of MRSA infection He continues to smoke tobacco He has been using Rx ABX cream, unsure of name He denies oral ABX use in last 90 days    Patient Care Team    Relationship Specialty Notifications Start End  Mina Marble D, NP PCP - General Family Medicine  03/03/17   Buford Dresser, MD PCP - Cardiology Cardiology Admissions 07/18/18   Allyn Kenner, MD  Dermatology  04/06/17   Charlett Blake, MD Consulting Physician Physical Medicine and Rehabilitation  11/11/17     Patient Active Problem List   Diagnosis Date Noted  . Body mass index 40.0-44.9, adult ([place]) 04/19/2018  . Pain in left foot 04/19/2018  . Shortness of breath 04/07/2018  . Lower extremity edema 04/07/2018  . Stasis dermatitis of both legs 04/07/2018  . Hypokalemia 04/07/2018  . Healthcare maintenance 04/07/2018  . Great toe pain, left 03/08/2018  . Acute gout involving toe of left foot 03/08/2018  . Low libido 02/09/2018  . OSA (obstructive  sleep apnea) 02/03/2018  . Status post nasal septoplasty 02/03/2018  . Deviated septum 11/11/2017  . Umbilical hernia without obstruction and without gangrene 11/11/2017  . Scrotal pain 11/11/2017  . Scrotal cyst 11/11/2017  . Screening, lipid 11/11/2017  . Screening for diabetes mellitus (DM) 11/11/2017  . Screening for thyroid disorder 11/11/2017  . Anxiety and depression 08/11/2017  . Acute maxillary sinusitis 08/11/2017  . 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 Lumbar degenerative disc disease 08/02/2017  . Acute left ankle pain 07/12/2017  . Skin infection 06/03/2017  . Chronic folliculitis 72/53/6644  . [place] 04/10/2017  . Cellulitis and abscess of trunk s/p I&D 04/10/2017 04/06/2017  . History of MRSA infection 04/06/2017  . Physical exam, annual 03/03/2017  . Neuropathy 09/28/2015  . Major depression 12/26/2012  . Generalized anxiety disorder 12/26/2012  . Opiate dependence ([place]) 12/24/2012  . Benzodiazepine dependence ([place]) 12/24/2012  . HTN (hypertension) 12/24/2012  . 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 Morbid obesity ([place]) 09/09/2010  . TOBACCO ABUSE 09/09/2010  . LOW BACK PAIN, CHRONIC 09/09/2010     Past Medical History:  Diagnosis Date  . Anxiety   . Chronic back pain   . Degenerative disk disease   . Depression   . DVT of upper extremity (deep vein thrombosis) ([place])   . GERD (gastroesophageal reflux disease)   . Hypertension   . MRSA (methicillin resistant staph aureus) culture positive   . Peripheral neuropathy   . Pilonidal cyst   . Pulmonary embolus ([place])    no blood thinner 2002  .  Sleep apnea    can't use cpap due to deviated nasal septumg     Past Surgical History:  Procedure Laterality Date  . Irrigation and debridement of back abscess    . NASAL SEPTOPLASTY W/ TURBINOPLASTY [name] 02/03/2018   Procedure: NASAL SEPTOPLASTY WITH TURBINATE REDUCTION;  Surgeon: [name], MD;  Location: [place];  Service: ENT;  Laterality:  [name];  . PILONIDAL CYST DRAINAGE [name] 04/10/2017   Procedure: IRRIGATION AND DEBRIDEMENT BACK ABSCESS;  Surgeon: [name], MD;  [facility];  Service: General;  Laterality: [name];  . SINUS ENDO WITH FUSION [name] 02/03/2018   Procedure: ENDOSCOPIC SINUS SURGERY WITH FUSION;  Surgeon: [name], MD;  Location: [place];  Service: ENT;  Laterality: [name];  . THORACIC OUTLET SURGERY  2000  . WRIST FUSION  2002     Family History  Problem Relation Age of Onset  . Cancer Maternal Grandmother        breast, ovarian & colon  . Hypertension Mother   . Diabetes Mother   . Hypertension Father   . Heart disease Father   . Stroke Paternal Grandfather   . Neuropathy Neg Hx      Social History   Substance and Sexual Activity  Drug Use No     Social History   Substance and Sexual Activity  Alcohol Use No  . Alcohol/week: 0.0 standard drinks   Comment: Rarely     Social History   Tobacco Use  Smoking Status Current Every Day Smoker  . Packs/day: 1.00  . Years: 33.00  . Pack years: 33.00  . Types: Cigarettes  Smokeless Tobacco Never Used     Outpatient Encounter Medications as of 01/24/2019  Medication Sig Note  . albuterol (PROVENTIL HFA;VENTOLIN HFA) 108 (90 Base) MCG/ACT inhaler Inhale 2 puffs into the lungs every 6 (six) hours as needed for wheezing or shortness of breath.   [name] amLODipine (NORVASC) 2.5 MG tablet TAKE 1 TABLET (2.5 MG TOTAL) BY MOUTH DAILY.   . fluticasone (FLONASE) 50 MCG/ACT nasal spray SPRAY 2 SPRAYS IN EACH NOSTRIL DAILY   . hydrochlorothiazide (HYDRODIURIL) 25 MG tablet TAKE 1 TABLET (25 MG TOTAL) BY MOUTH 2 (TWO) TIMES DAILY. (Patient taking differently: Take 25 mg by mouth daily. )   . montelukast (SINGULAIR) 10 MG tablet Take 1 tablet (10 mg total) by mouth at bedtime.   . Nutritional Supplements (JUICE PLUS FIBRE PO) Take 4 each by mouth daily with lunch. 01/24/2018: Fruit and vegatable  . oxyCODONE-acetaminophen (PERCOCET) 7.5-325 MG tablet Take 1  tablet by mouth every 6 (six) hours as needed.   . pregabalin (LYRICA) 300 MG capsule Take 1 capsule (300 mg total) by mouth 2 (two) times daily.   . [DISCONTINUED] FLUoxetine (PROZAC) 20 MG capsule TAKE 1 CAPSULE BY MOUTH DAILY AFTER COMPLETION FIRST WEEK OF FLUOXETINE 10MG    . [DISCONTINUED] silver sulfADIAZINE (SILVADENE) 1 % cream Apply 1 application topically daily.   . [DISCONTINUED] varenicline (CHANTIX) 1 MG tablet Take 1 tablet (1 mg total) by mouth 2 (two) times daily.    No facility-administered encounter medications on file as of 01/24/2019.     Allergies: Pollen extract; Dust mite extract; and Other  There is no height or weight on file to calculate BMI.  There were no vitals taken for this visit.  Observations/Objective: No acute distress noted over the telephone  Assessment and Plan: Unable to treat abscess/infection until I can visualize. Sent pt MyChart email and text Signup instructions, once he  sets up and send in photo- I can complete tx Likely will treat with Doxcycline due to +MRSA hx  Follow Up Instructions: PRN   I discussed the assessment and treatment plan with the patient. The patient was provided an opportunity to ask questions and all were answered. The patient agreed with the plan and demonstrated an understanding of the instructions.   The patient was advised to call back or seek an in-person evaluation if the symptoms worsen or if the condition fails to improve as anticipated.  I provided 30 minutes of non-face-to-face time during this encounter.   [name], NP

## 2019-01-25 NOTE — Telephone Encounter (Signed)
Patient called back stating that, he's not sure why, but pain medication this time around doesn't seem to be working. He suggests maybe a Risk analyst. He states his legs are jumping like lightning at night. Wondering if he can take an extra lyrica at night.  Patient states he will run out of meds before his 02/06/2019 visit.

## 2019-01-25 NOTE — Telephone Encounter (Signed)
Called patient again. LVM to call us back regarding medication problem

## 2019-01-25 NOTE — Telephone Encounter (Signed)
Return Eduardo [name] call, he was instructed to continue current Lyrica dosage, he's currently at the max dose, he verbalizes understanding. Also instructed to call office in the morning to change appointment, he verbalizes understanding.

## 2019-01-31 ENCOUNTER — Other Ambulatory Visit: Payer: Self-pay

## 2019-01-31 ENCOUNTER — Encounter: Payer: Self-pay | Admitting: Registered Nurse

## 2019-01-31 ENCOUNTER — Encounter: Payer: BC Managed Care – PPO | Attending: Physical Medicine & Rehabilitation | Admitting: Registered Nurse

## 2019-01-31 VITALS — Ht 76.0 in | Wt 360.0 lb

## 2019-01-31 DIAGNOSIS — G894 Chronic pain syndrome: Secondary | ICD-10-CM | POA: Diagnosis not present

## 2019-01-31 DIAGNOSIS — E538 Deficiency of other specified B group vitamins: Secondary | ICD-10-CM

## 2019-01-31 DIAGNOSIS — M5416 Radiculopathy, lumbar region: Secondary | ICD-10-CM | POA: Diagnosis not present

## 2019-01-31 DIAGNOSIS — M21612 Bunion of left foot: Secondary | ICD-10-CM | POA: Insufficient documentation

## 2019-01-31 DIAGNOSIS — G609 Hereditary and idiopathic neuropathy, unspecified: Secondary | ICD-10-CM

## 2019-01-31 DIAGNOSIS — M79672 Pain in left foot: Secondary | ICD-10-CM | POA: Insufficient documentation

## 2019-01-31 DIAGNOSIS — M21611 Bunion of right foot: Secondary | ICD-10-CM | POA: Insufficient documentation

## 2019-01-31 DIAGNOSIS — G608 Other hereditary and idiopathic neuropathies: Secondary | ICD-10-CM | POA: Insufficient documentation

## 2019-01-31 DIAGNOSIS — M5136 Other intervertebral disc degeneration, lumbar region: Secondary | ICD-10-CM | POA: Diagnosis not present

## 2019-01-31 DIAGNOSIS — Z5181 Encounter for therapeutic drug level monitoring: Secondary | ICD-10-CM

## 2019-01-31 DIAGNOSIS — M79671 Pain in right foot: Secondary | ICD-10-CM | POA: Insufficient documentation

## 2019-01-31 DIAGNOSIS — Z79899 Other long term (current) drug therapy: Secondary | ICD-10-CM

## 2019-01-31 MED ORDER — OXYCODONE-ACETAMINOPHEN 7.5-325 MG PO TABS: 1.0000 | ORAL_TABLET | Freq: Four times a day (QID) | ORAL | 0 refills | Status: DC | PRN

## 2019-01-31 NOTE — Progress Notes (Signed)
Subjective:    Patient ID: Eduardo [name], male    DOB: 1970/12/27, 48 y.o.   MRN: [id number]  HPI: Eduardo [name] is a 48 y.o. male his appointment was changed, due to national recommendations of social distancing due to [place] 19, an audio/video telehealth visit is felt to be most appropriate for this patient at this time.  See Chart message from today for the patient's consent to telehealth from [name].    He states his pain is located in his lower back radiating into his bilateral lower extremities and bilateral feet. He rates his pain 7. His  current exercise regime is walking.  Eduardo [name] Morphine equivalent is 45.00  MME.  Last UDS was  Performed on 11/08/2018  Eduardo [name] asked the Health and History Questions. This provider and Eduardo [name]  verified we were speaking with the correct person using two identifiers.  Pain Inventory Average Pain 8 Pain Right Now 7 My pain is constant, dull, tingling and aching  In the last 24 hours, has pain interfered with the following? General activity 10 Relation with others 8 Enjoyment of life 10 What TIME of day is your pain at its worst? all Sleep (in general) Poor  Pain is worse with: walking, bending, sitting, inactivity, standing and some activites Pain improves with: rest and medication Relief from Meds: 4  Mobility walk without assistance how many minutes can you walk? 3 ability to climb steps?  yes do you drive?  yes  Function employed # of hrs/week 6-8  Neuro/Psych weakness numbness tremor tingling trouble walking spasms confusion anxiety  Prior Studies Any changes since last visit?  no  Physicians involved in your care Any changes since last visit?  no   Family History  Problem Relation Age of Onset  . Cancer Maternal Grandmother        breast, ovarian & colon  . Hypertension Mother   . Diabetes Mother   . Hypertension Father   . Heart disease Father    . Stroke Paternal Grandfather   . Neuropathy Neg Hx    Social History   Socioeconomic History  . Marital status: Divorced    Spouse name: Not on file  . Number of children: 2  . Years of education: 12th   . Highest education level: Not on file  Occupational History  . Occupation: [name]  Social Needs  . Financial resource strain: Not on file  . Food insecurity:    Worry: Not on file    Inability: Not on file  . Transportation needs:    Medical: Not on file    Non-medical: Not on file  Tobacco Use  . Smoking status: Current Every Day Smoker    Packs/day: 1.00    Years: 33.00    Pack years: 33.00    Types: Cigarettes  . Smokeless tobacco: Never Used  Substance and Sexual Activity  . Alcohol use: No    Alcohol/week: 0.0 standard drinks    Comment: Rarely  . Drug use: No  . Sexual activity: Yes    Birth control/protection: None  Lifestyle  . Physical activity:    Days per week: Not on file    Minutes per session: Not on file  . Stress: Not on file  Relationships  . Social connections:    Talks on phone: Not on file    Gets together: Not on file    Attends religious service: Not on file    Active  member of club or organization: Not on file    Attends meetings of clubs or organizations: Not on file    Relationship status: Not on file  Other Topics Concern  . Not on file  Social History Narrative   Lives at home w/ his step-mother   Right-handed   Drinks about 2-3 Mt Dews a day   Past Surgical History:  Procedure Laterality Date  . Irrigation and debridement of back abscess    . NASAL SEPTOPLASTY W/ TURBINOPLASTY [name] 02/03/2018   Procedure: NASAL SEPTOPLASTY WITH TURBINATE REDUCTION;  Surgeon: Eduardo [name];  Location: [place];  Service: ENT;  Laterality: [name];  . PILONIDAL CYST DRAINAGE [name] 04/10/2017   Procedure: IRRIGATION AND DEBRIDEMENT BACK ABSCESS;  Surgeon: Eduardo [name];  Location: [facility];  Service: General;  Laterality: [name];  . SINUS ENDO WITH FUSION  [name] 02/03/2018   Procedure: ENDOSCOPIC SINUS SURGERY WITH FUSION;  Surgeon: Eduardo [name];  Location: [place];  Service: ENT;  Laterality: [name];  . THORACIC OUTLET SURGERY  2000  . WRIST FUSION  2002   Past Medical History:  Diagnosis Date  . Anxiety   . Chronic back pain   . Degenerative disk disease   . Depression   . DVT of upper extremity (deep vein thrombosis) ([place])   . GERD (gastroesophageal reflux disease)   . Hypertension   . MRSA (methicillin resistant staph aureus) culture positive   . Peripheral neuropathy   . Pilonidal cyst   . Pulmonary embolus ([place])    no blood thinner 2002  . Sleep apnea    can't use cpap due to deviated nasal septumg   Ht 6\' 4"  (1.93 m)   Wt (!) 360 lb (163.3 kg)   BMI 43.82 kg/m   Opioid Risk Score:   Fall Risk Score:  `1  Depression screen PHQ 2/9  Depression screen [facility] 2/9 01/24/2019 01/03/2019 12/14/2018 09/28/2018 08/02/2018 07/14/2018 04/07/2018  Decreased Interest 3 1 1 1 1 3 1   Down, Depressed, Hopeless 1 1 1 1 1 3 1   PHQ - 2 Score 4 2 2 2 2 6 2   Altered sleeping 3 - - 3 - - 3  Tired, decreased energy 3 - - 3 - - 3  Change in appetite 3 - - 2 - - 2  Feeling bad or failure about yourself  0 - - 1 - - 1  Trouble concentrating 3 - - 3 - - 3  Moving slowly or fidgety/restless 3 - - 1 - - 3  Suicidal thoughts 0 - - 0 - - 0  PHQ-9 Score 19 - - 15 - - 17  Difficult doing work/chores Very difficult - - Somewhat difficult - - Very difficult  Some recent data might be hidden    Review of Systems  Constitutional: Negative.   HENT: Negative.   Eyes: Negative.   Respiratory: Positive for shortness of breath.   Cardiovascular: Negative.   Gastrointestinal: Negative.   Genitourinary: Negative.   Musculoskeletal: Positive for arthralgias, back pain and gait problem.  Skin: Negative.   Allergic/Immunologic: Negative.   Neurological: Positive for weakness and numbness.       Tingling  Psychiatric/Behavioral: Positive for confusion. The  patient is nervous/anxious.   All other systems reviewed and are negative.      Objective:   Physical Exam Vitals signs and nursing note reviewed.  Musculoskeletal:     Comments: No Physical Exam: Virtual Visit.  Neurological:     Mental Status:  He is oriented to person, place, and time.           Assessment & Plan:  1. Chronic idiopathic axonal polyneuropathy: Continue Lyrica.01/31/2019. 2. Posterior tibial tendinitis of left lower extremity: Continue HEP as Tolerated. Continue to Monitor.01/31/2019 3. Lumbar DDD/ Lumbar Radiculitis: Continue current medication regime and HEP as Tolerated.01/31/2019 4. Chronic Pain Syndrome:Refilled: Oxycodone7.5/325mg  one tablet4 times daily as needed for pain. PerDr, Kirsteins note he'sunwilling to go any higher given his history in terms of total morphine equivalent dose.01/31/2019 We will continue the opioid monitoring program, this consists of regular clinic visits, examinations, urine drug screen, pill counts as well as use of [place] Controlled Substance Reporting system.  F/U in 1 month  Location of patient: In his Home Location of provider: Office Established patient Time spent on call: 10 Minutes

## 2019-02-06 ENCOUNTER — Ambulatory Visit: Payer: Self-pay | Admitting: Physical Medicine & Rehabilitation

## 2019-02-07 ENCOUNTER — Ambulatory Visit (HOSPITAL_COMMUNITY): Payer: BC Managed Care – PPO

## 2019-02-13 ENCOUNTER — Ambulatory Visit: Payer: Self-pay | Admitting: Dietician

## 2019-02-16 ENCOUNTER — Encounter: Payer: BC Managed Care – PPO | Attending: Surgery | Admitting: Skilled Nursing Facility1

## 2019-02-16 ENCOUNTER — Other Ambulatory Visit: Payer: Self-pay

## 2019-02-16 ENCOUNTER — Encounter: Payer: Self-pay | Admitting: Skilled Nursing Facility1

## 2019-02-16 DIAGNOSIS — E669 Obesity, unspecified: Secondary | ICD-10-CM | POA: Insufficient documentation

## 2019-02-16 NOTE — Progress Notes (Signed)
Pre-Op Assessment Visit:  Pre-Operative Sleeve Gastrectomy Surgery  Medical Nutrition Therapy:  Appt start time: 2:45  End time:  3:45  Patient was seen on 02/16/2019 for Pre-Operative Nutrition Assessment. Assessment and letter of approval faxed to [facility] Bariatric Surgery Program coordinator on 02/16/2019.    Referral Stated [facility] Appointments Required: 0  Proposed Surgery Type: Sleeve Gastrectomy   Pt expectation of surgery: to cure his anxiety and lack of sleeping at night: dietitian educated the pt on these things not being cured by the surgery directly   Pt expectation of dietitian: none stated   NUTRITION ASSESSMENT   Anthropometrics  Start weight at [facility]: 349 lbs Today's weight: 349 lbs BMI: 42.48 kg/m2     Psychosocial/Lifestyle  Pt states he struggles with chronic back pain and neuropathy. Pt states when he got down to 264 pounds he still had a lot of pain. Pt states he sleeps a max of 4 hours a night withOUT rest and urinates about 6-8 times a night. Pt states his urologist states he urinates that often at night due to his weight. Pt states he has bumps that come up on his arm and then he picks at them due to his anxiety creating sores on his arm. Pt states he has a lot of stress in his life worsening his anxiety. Pt states he is thinking surgery will cure his anxiety and sleep issues.  Pt was given the crisis line hot line. Pt states he is uncertain how he is going to quit smoking. Pt states he had a Blood clot and almost died due to embolism because of this trauma he has too much fear to wear his C-PAP. Pt states he recently moved in with his step mom due to losing his home so he does not want to clean her mess to use the kitchen so he does not cook. Pt states he eats a lot of cheese.   Pt states he will work on cutting back to 2 mountain dews a day, Using the picture app to log food, eat fruit every day, and eat non starchy vegetables at least 2 times a day.     Pt is at risk for several micronutrient deficiencies such as vitamin C due to his smoking behaviors and lack of healthy diet.   Medications: See list  Labs:  Notable Signs/Symptoms Back pain Nocturnal polyuria  neuropathy   24-Hr Dietary Recall: 2 meals a day First Meal: 4 sausage burrittos Snack:  Second Meal: half a cantaloupe  Snack:  Third Meal: 2 hamburgers  Snack:  Beverages: water, 4-6 12 ounce bottles mountain dew  Physical Activity  ADL's   Estimated Energy Needs Calories: 2200 Carbohydrate: 248 Protein: 165 Fat: 61   NUTRITION DIAGNOSIS  Overweight/obesity ([place]-3.3) related to past poor dietary habits and physical inactivity as evidenced by patient w/ planned Sleeve surgery following dietary guidelines for continued weight loss.    NUTRITION INTERVENTION  Nutrition counseling (C-1) and education (E-2) to facilitate bariatric surgery goals.   Handouts given during visit include:  . Pre-Op Goals . Bariatric Surgery Protein Shakes . Vitamin and Mineral Options    During the appointment today the following Pre-Op Goals were reviewed with the patient: . Log your food and beverage via an app or pen and paper  . Make healthy food choices . Begin to limit portion sizes . Limited concentrated sugars and fried foods . Keep fat/sugar in the single digits per serving on  food labels . Practice CHEWING your food  (aim for 30 chews per bite or until applesauce consistency) . Practice not drinking 15 minutes before, during, and 30 minutes after each meal/snack . Avoid all carbonated beverages  . Avoid/limit caffeinated beverages  . Avoid all sugar-sweetened beverages . Consume 3 meals per day; eat every 3-5 hours . Make a list of non-food related activities . Aim for 64-100 ounces of FLUID daily  . Aim for at least 60-80 grams of PROTEIN daily . Look for a liquid protein source that contain ?15 g protein and ?5 g carbohydrate  (ex: shakes, drinks,  shots)   Change readiness: precontemplative   Demonstrated degree of understanding via: Teach Back      MONITORING & EVALUATION Dietary intake, weekly physical activity, body weight, and pre-op goals reached.    Next Steps  Patient is to call [facility] (once surgery date is scheduled) to be scheduled for Pre-Op Class, which must be at least 2 weeks prior to surgery date or back follow up visit in 1 month

## 2019-03-01 ENCOUNTER — Other Ambulatory Visit (HOSPITAL_COMMUNITY): Payer: BC Managed Care – PPO

## 2019-03-01 ENCOUNTER — Ambulatory Visit (HOSPITAL_COMMUNITY): Payer: BC Managed Care – PPO

## 2019-03-02 ENCOUNTER — Other Ambulatory Visit: Payer: Self-pay

## 2019-03-02 ENCOUNTER — Encounter: Payer: BC Managed Care – PPO | Attending: Physical Medicine & Rehabilitation | Admitting: Registered Nurse

## 2019-03-02 ENCOUNTER — Encounter: Payer: Self-pay | Admitting: Registered Nurse

## 2019-03-02 VITALS — Ht 76.0 in | Wt 343.0 lb

## 2019-03-02 DIAGNOSIS — M21611 Bunion of right foot: Secondary | ICD-10-CM | POA: Insufficient documentation

## 2019-03-02 DIAGNOSIS — E538 Deficiency of other specified B group vitamins: Secondary | ICD-10-CM

## 2019-03-02 DIAGNOSIS — M5136 Other intervertebral disc degeneration, lumbar region: Secondary | ICD-10-CM | POA: Diagnosis not present

## 2019-03-02 DIAGNOSIS — M51369 Other intervertebral disc degeneration, lumbar region without mention of lumbar back pain or lower extremity pain: Secondary | ICD-10-CM

## 2019-03-02 DIAGNOSIS — G894 Chronic pain syndrome: Secondary | ICD-10-CM

## 2019-03-02 DIAGNOSIS — G609 Hereditary and idiopathic neuropathy, unspecified: Secondary | ICD-10-CM

## 2019-03-02 DIAGNOSIS — Z79899 Other long term (current) drug therapy: Secondary | ICD-10-CM

## 2019-03-02 DIAGNOSIS — Z5181 Encounter for therapeutic drug level monitoring: Secondary | ICD-10-CM | POA: Diagnosis not present

## 2019-03-02 DIAGNOSIS — M5416 Radiculopathy, lumbar region: Secondary | ICD-10-CM | POA: Diagnosis not present

## 2019-03-02 DIAGNOSIS — G608 Other hereditary and idiopathic neuropathies: Secondary | ICD-10-CM | POA: Insufficient documentation

## 2019-03-02 DIAGNOSIS — M21612 Bunion of left foot: Secondary | ICD-10-CM | POA: Insufficient documentation

## 2019-03-02 DIAGNOSIS — M79671 Pain in right foot: Secondary | ICD-10-CM | POA: Insufficient documentation

## 2019-03-02 DIAGNOSIS — M79672 Pain in left foot: Secondary | ICD-10-CM | POA: Insufficient documentation

## 2019-03-02 MED ORDER — PREGABALIN 300 MG PO CAPS: 300.0000 mg | ORAL_CAPSULE | Freq: Two times a day (BID) | ORAL | 2 refills | Status: DC

## 2019-03-02 MED ORDER — OXYCODONE-ACETAMINOPHEN 7.5-325 MG PO TABS: 1.0000 | ORAL_TABLET | Freq: Four times a day (QID) | ORAL | 0 refills | Status: DC | PRN

## 2019-03-02 NOTE — Progress Notes (Signed)
Subjective:    Patient ID: Eduardo [name], male    DOB: 1971/09/25, 48 y.o.   MRN: [id number]  HPI: Eduardo [name] is a 48 y.o. male his appointment was changed, due to national recommendations of social distancing due to [place] 19, an audio/video telehealth visit is felt to be most appropriate for this patient at this time.  See Chart message from today for the patient's consent to telehealth from [place].     He states his pain is located in his lower back radiating into his bilateral lower extremities and bilateral feet. He rates his  pain 6. His  current exercise regime is walking.  Mr. Barney Morphine equivalent is 45.00 MME.  Last UDS was performed on 11/08/2018, it was consistent.   Lorn Junes CMA asked the Health and History questions. This provider and Kennon Rounds verified we were  speaking with the correct person using two identifiers.   Pain Inventory Average Pain 8 Pain Right Now 6 My pain is constant, dull, stabbing and tingling  In the last 24 hours, has pain interfered with the following? General activity 6 Relation with others 6 Enjoyment of life 5 What TIME of day is your pain at its worst? ALL Sleep (in general) Poor  Pain is worse with: walking, bending, sitting, inactivity, standing, unsure and some activites Pain improves with: medication Relief from Meds: 5  Mobility how many minutes can you walk? 2-3 ability to climb steps?  yes do you drive?  yes  Function employed # of hrs/week 40  Neuro/Psych weakness numbness tingling  Prior Studies Any changes since last visit?  no  Physicians involved in your care Any changes since last visit?  no   Family History  Problem Relation Age of Onset  . Cancer Maternal Grandmother        breast, ovarian & colon  . Hypertension Mother   . Diabetes Mother   . Hypertension Father   . Heart disease Father   . Stroke Paternal Grandfather   . Neuropathy  Neg Hx    Social History   Socioeconomic History  . Marital status: Divorced    Spouse name: Not on file  . Number of children: 2  . Years of education: 12th   . Highest education level: Not on file  Occupational History  . Occupation: [name]  Social Needs  . Financial resource strain: Not on file  . Food insecurity:    Worry: Not on file    Inability: Not on file  . Transportation needs:    Medical: Not on file    Non-medical: Not on file  Tobacco Use  . Smoking status: Current Every Day Smoker    Packs/day: 1.00    Years: 33.00    Pack years: 33.00    Types: Cigarettes  . Smokeless tobacco: Never Used  Substance and Sexual Activity  . Alcohol use: No    Alcohol/week: 0.0 standard drinks    Comment: Rarely  . Drug use: No  . Sexual activity: Yes    Birth control/protection: None  Lifestyle  . Physical activity:    Days per week: Not on file    Minutes per session: Not on file  . Stress: Not on file  Relationships  . Social connections:    Talks on phone: Not on file    Gets together: Not on file    Attends religious service: Not on file    Active member of club or  organization: Not on file    Attends meetings of clubs or organizations: Not on file    Relationship status: Not on file  Other Topics Concern  . Not on file  Social History Narrative   Lives at home w/ his step-mother   Right-handed   Drinks about 2-3 Mt Dews a day   Past Surgical History:  Procedure Laterality Date  . Irrigation and debridement of back abscess    . NASAL SEPTOPLASTY W/ TURBINOPLASTY [name] 02/03/2018   Procedure: NASAL SEPTOPLASTY WITH TURBINATE REDUCTION;  Surgeon: [name], MD;  Location: [place];  Service: ENT;  Laterality: [name];  . PILONIDAL CYST DRAINAGE [name] 04/10/2017   Procedure: IRRIGATION AND DEBRIDEMENT BACK ABSCESS;  Surgeon: [name], MD;  Location: [facility];  Service: General;  Laterality: [name];  . SINUS ENDO WITH FUSION [name] 02/03/2018   Procedure: ENDOSCOPIC SINUS  SURGERY WITH FUSION;  Surgeon: [name], MD;  Location: [place];  Service: ENT;  Laterality: [name];  . THORACIC OUTLET SURGERY  2000  . WRIST FUSION  2002   Past Medical History:  Diagnosis Date  . Anxiety   . Chronic back pain   . Degenerative disk disease   . Depression   . DVT of upper extremity (deep vein thrombosis) ([place])   . GERD (gastroesophageal reflux disease)   . Hypertension   . MRSA (methicillin resistant staph aureus) culture positive   . Peripheral neuropathy   . Pilonidal cyst   . Pulmonary embolus ([place])    no blood thinner 2002  . Sleep apnea    can't use cpap due to deviated nasal septumg   Ht 6\' 4"  (1.93 m)   Wt (!) 343 lb (155.6 kg)   BMI 41.75 kg/m   Opioid Risk Score:   Fall Risk Score:  `1  Depression screen PHQ 2/9  Depression screen [facility] 2/9 03/02/2019 02/16/2019 01/24/2019 01/03/2019 12/14/2018 09/28/2018 08/02/2018  Decreased Interest 0 0 3 1 1 1 1   Down, Depressed, Hopeless 0 0 1 1 1 1 1   PHQ - 2 Score 0 0 4 2 2 2 2   Altered sleeping - - 3 - - 3 -  Tired, decreased energy - - 3 - - 3 -  Change in appetite - - 3 - - 2 -  Feeling bad or failure about yourself  - - 0 - - 1 -  Trouble concentrating - - 3 - - 3 -  Moving slowly or fidgety/restless - - 3 - - 1 -  Suicidal thoughts - - 0 - - 0 -  PHQ-9 Score - - 19 - - 15 -  Difficult doing work/chores - - Very difficult - - Somewhat difficult -  Some recent data might be hidden     Review of Systems  Constitutional: Negative.   HENT: Negative.   Respiratory: Negative.   Cardiovascular: Negative.   Gastrointestinal: Negative.   Endocrine: Negative.   Genitourinary: Negative.   Musculoskeletal: Positive for back pain.       Leg pain  Feet pain   Skin: Negative.   Neurological: Positive for tremors, weakness and numbness.       Tingling  Hematological: Negative.   Psychiatric/Behavioral: Negative.   All other systems reviewed and are negative.      Objective:   Physical Exam Vitals  signs and nursing note reviewed.  Musculoskeletal:     Comments: No Physical Exam Performed: Virtual Visit  Neurological:     Mental Status: He is oriented to  person, place, and time.           Assessment & Plan:  1. Chronic idiopathic axonal polyneuropathy: Continue Lyrica.03/02/2019. 2. Posterior tibial tendinitis of left lower extremity: Continue HEP as Tolerated. Continue to Monitor.03/02/2019 3. Lumbar DDD/ Lumbar Radiculitis: Continue current medication regime and HEP as Tolerated.03/02/2019 4. Chronic Pain Syndrome:Refilled: Oxycodone7.5/325mg  one tablet4 times daily as needed for pain. PerDr, Kirsteins note he'sunwilling to go any higher given his history in terms of total morphine equivalent dose.03/02/2019 We will continue the opioid monitoring program, this consists of regular clinic visits, examinations, urine drug screen, pill counts as well as use of [place] Controlled Substance Reporting system.  F/U in 1 month   Telephone Call  Location of patient: In his Home  Location of provider: Office Established patient Time spent on call: 10 Minutes

## 2019-03-06 NOTE — Progress Notes (Signed)
Subjective:    Patient ID: Eduardo [name], male    DOB: 03-24-1971, 48 y.o.   MRN: [id number]  HPI:  Mr. Rabinovich presents with several issues: 1) OD stye, swelling, pain, and watery drainage that began 3 days ago. He reports slight blurred vision and constant "annoying pain" in OD, rated 2/10. He denies photophobia. He denies dizziness/HA/N/V/D/increase in imbalance issues. He has been using warm compress with some sx relief. He does not wear contacts or corrective lenses. 2) Re-current skin infection of lower back, upper buttock area. He had TeleMedicine visit 01/24/2019, however was unable to send pictures via MyChart, therefore was never treated with ABX. 3) Re-current skin infections.  He was referred to ID August 2018, however he never established.  Discussed that repeat use of ABX increases likelihood of developing drug resistance and ID can help with identifying the source of infection.  He is agreeable to referral. 4) He continues to use tobacco, 1/2 pack/day- declined smoking cessation. 5) He continues to experience pronounced daytime fatigue, he is unable to wear CPAP due to significant claustrophobia.  Patient Care Team    Relationship Specialty Notifications Start End  Mina Marble D, NP PCP - General Family Medicine  03/03/17   Buford Dresser, MD PCP - Cardiology Cardiology Admissions 07/18/18   Allyn Kenner, MD  Dermatology  04/06/17   Charlett Blake, MD Consulting Physician Physical Medicine and Rehabilitation  11/11/17     Patient Active Problem List   Diagnosis Date Noted  . Recurrent infections 03/07/2019  . Blepharitis of right upper eyelid 03/07/2019  . Body mass index 40.0-44.9, adult ([place]) 04/19/2018  . Pain in left foot 04/19/2018  . Shortness of breath 04/07/2018  . Lower extremity edema 04/07/2018  . Stasis dermatitis of both legs 04/07/2018  . Hypokalemia 04/07/2018  . Healthcare maintenance 04/07/2018  . Great toe pain, left 03/08/2018  .  Acute gout involving toe of left foot 03/08/2018  . Low libido 02/09/2018  . OSA (obstructive sleep apnea) 02/03/2018  . Status post nasal septoplasty 02/03/2018  . Deviated septum 11/11/2017  . Umbilical hernia without obstruction and without gangrene 11/11/2017  . Scrotal pain 11/11/2017  . Scrotal cyst 11/11/2017  . Screening, lipid 11/11/2017  . Screening for diabetes mellitus (DM) 11/11/2017  . Screening for thyroid disorder 11/11/2017  . Anxiety and depression 08/11/2017  . Acute maxillary sinusitis 08/11/2017  . 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 Lumbar degenerative disc disease 08/02/2017  . Acute left ankle pain 07/12/2017  . Skin infection 06/03/2017  . Chronic folliculitis 33/29/5188  . [place] 04/10/2017  . Cellulitis and abscess of trunk s/p I&D 04/10/2017 04/06/2017  . History of MRSA infection 04/06/2017  . Physical exam, annual 03/03/2017  . Neuropathy 09/28/2015  . Major depression 12/26/2012  . Generalized anxiety disorder 12/26/2012  . Opiate dependence ([place]) 12/24/2012  . Benzodiazepine dependence ([place]) 12/24/2012  . HTN (hypertension) 12/24/2012  . 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 Morbid obesity ([place]) 09/09/2010  . TOBACCO ABUSE 09/09/2010  . LOW BACK PAIN, CHRONIC 09/09/2010     Past Medical History:  Diagnosis Date  . Anxiety   . Chronic back pain   . Degenerative disk disease   . Depression   . DVT of upper extremity (deep vein thrombosis) ([place])   . GERD (gastroesophageal reflux disease)   . Hypertension   . MRSA (methicillin resistant staph aureus) culture positive   . Peripheral neuropathy   . Pilonidal cyst   . Pulmonary  embolus ([place])    no blood thinner 2002  . Sleep apnea    can't use cpap due to deviated nasal septumg     Past Surgical History:  Procedure Laterality Date  . Irrigation and debridement of back abscess    . NASAL SEPTOPLASTY W/ TURBINOPLASTY [name] 02/03/2018   Procedure: NASAL SEPTOPLASTY  WITH TURBINATE REDUCTION;  Surgeon: [name], MD;  Location: [facility];  Service: ENT;  Laterality: [name];  . PILONIDAL CYST DRAINAGE [name] 04/10/2017   Procedure: IRRIGATION AND DEBRIDEMENT BACK ABSCESS;  Surgeon: [name], MD;  Location: [facility];  Service: General;  Laterality: [name];  . SINUS ENDO WITH FUSION [name] 02/03/2018   Procedure: ENDOSCOPIC SINUS SURGERY WITH FUSION;  Surgeon: [name], MD;  Location: [facility];  Service: ENT;  Laterality: [name];  . THORACIC OUTLET SURGERY  2000  . WRIST FUSION  2002     Family History  Problem Relation Age of Onset  . Cancer Maternal Grandmother        breast, ovarian & colon  . Hypertension Mother   . Diabetes Mother   . Hypertension Father   . Heart disease Father   . Stroke Paternal Grandfather   . Neuropathy Neg Hx      Social History   Substance and Sexual Activity  Drug Use No     Social History   Substance and Sexual Activity  Alcohol Use No  . Alcohol/week: 0.0 standard drinks   Comment: Rarely     Social History   Tobacco Use  Smoking Status Current Every Day Smoker  . Packs/day: 1.00  . Years: 33.00  . Pack years: 33.00  . Types: Cigarettes  Smokeless Tobacco Never Used     Outpatient Encounter Medications as of 03/07/2019  Medication Sig Note  . albuterol (PROVENTIL HFA;VENTOLIN HFA) 108 (90 Base) MCG/ACT inhaler Inhale 2 puffs into the lungs every 6 (six) hours as needed for wheezing or shortness of breath.   [name] amLODipine (NORVASC) 2.5 MG tablet TAKE 1 TABLET (2.5 MG TOTAL) BY MOUTH DAILY.   . fluticasone (FLONASE) 50 MCG/ACT nasal spray SPRAY 2 SPRAYS IN EACH NOSTRIL DAILY   . hydrochlorothiazide (HYDRODIURIL) 25 MG tablet Take 25 mg by mouth daily.   . montelukast (SINGULAIR) 10 MG tablet Take 1 tablet (10 mg total) by mouth at bedtime.   . Nutritional Supplements (JUICE PLUS FIBRE PO) Take 4 each by mouth daily with lunch. 01/24/2018: Fruit and vegatable  . oxyCODONE-acetaminophen (PERCOCET) 7.5-325 MG tablet  Take 1 tablet by mouth every 6 (six) hours as needed.   . pregabalin (LYRICA) 300 MG capsule Take 1 capsule (300 mg total) by mouth 2 (two) times daily.   [name] doxycycline (VIBRA-TABS) 100 MG tablet Take 1 tablet (100 mg total) by mouth 2 (two) times daily.   . [DISCONTINUED] doxycycline (VIBRA-TABS) 100 MG tablet Take 1 tablet (100 mg total) by mouth 2 (two) times daily.   . [DISCONTINUED] hydrochlorothiazide (HYDRODIURIL) 25 MG tablet TAKE 1 TABLET (25 MG TOTAL) BY MOUTH 2 (TWO) TIMES DAILY. (Patient taking differently: Take 25 mg by mouth daily. )    No facility-administered encounter medications on file as of 03/07/2019.     Allergies: Pollen extract; Dust mite extract; and Other  Body mass index is 42.46 kg/m.  Blood pressure (!) 134/94, pulse 83, temperature 98.4 F (36.9 C), temperature source Oral, height 6\' 4"  (1.93 m), weight (!) 348 lb 12.8 oz (158.2 kg), SpO2 97 %.  Review of Systems  Constitutional: Positive for fatigue. Negative for activity change, appetite change, chills, diaphoresis, fever and unexpected weight change.  HENT: Negative for congestion, ear discharge, ear pain and hearing loss.   Eyes: Positive for pain, discharge and redness. Negative for photophobia and visual disturbance.  Respiratory: Negative for cough, chest tightness, shortness of breath, wheezing and stridor.   Cardiovascular: Negative for chest pain, palpitations and leg swelling.  Gastrointestinal: Negative for abdominal distention, anal bleeding, blood in stool, constipation, diarrhea, nausea, rectal pain and vomiting.  Genitourinary: Negative for difficulty urinating and flank pain.  Musculoskeletal: Positive for arthralgias, back pain, gait problem and myalgias. Negative for joint swelling, neck pain and neck stiffness.  Skin: Positive for color change and wound. Negative for pallor and rash.  Neurological: Negative for dizziness and headaches.  Hematological: Does not bruise/bleed easily.   Psychiatric/Behavioral: Positive for sleep disturbance. Negative for confusion, decreased concentration, dysphoric mood, hallucinations, self-injury and suicidal ideas. The patient is not nervous/anxious and is not hyperactive.        Objective:   Physical Exam Vitals signs and nursing note reviewed.  Constitutional:      General: He is not in acute distress.    Appearance: He is obese. He is not ill-appearing, toxic-appearing or diaphoretic.  Eyes:     General:        Right eye: Discharge present. No foreign body.        Left eye: No foreign body or discharge.     Extraocular Movements:     Right eye: Normal extraocular motion and no nystagmus.     Left eye: Normal extraocular motion and no nystagmus.     Conjunctiva/sclera:     Right eye: Exudate present. No hemorrhage.    Left eye: No exudate or hemorrhage.    Pupils: Pupils are equal, round, and reactive to light.      Comments: OD: Swollen upper lip with hardened stye in middle of upper lip. Upper lid is quite reddened. Clear watery drainage noted   Skin:    General: Skin is warm and dry.     Capillary Refill: Capillary refill takes less than 2 seconds.          Comments: Lower back/upper buttocks: Multiple areas of circular open areas in various stages of healing. No drainage noted. No excessive heat or streaking noted  Neurological:     Mental Status: He is alert and oriented to person, place, and time.  Psychiatric:        Mood and Affect: Mood normal.        Behavior: Behavior normal.        Thought Content: Thought content normal.        Judgment: Judgment normal.       Assessment & Plan:   1. Recurrent infections   2. Blepharitis of right upper eyelid, unspecified type   3. Hypertension, unspecified type     HTN (hypertension) BP 134/94, HR 83 Currently on Amlodipine 2.5mg  QD, HCTZ 25mg  QD   Recurrent infections Second referral to ID placed  Blepharitis of right upper eyelid  Continue current  medication regime. To treat the right eye stye/blephartitis and lower back skin infections-  Doxycycline 100mg  twice daily for 10 days. Referral to Infections Disease (ID) placed, imperative that you be seen by this specialist to address re-current skin infections. Continue warm compresses to right eye. Follow-up in 6 weeks for chronic medical conditions. COVID-19 Education: Continue to follow the Stay at Home order, and when out- Social  Distance and wearing a facial mask when not at home.    FOLLOW-UP:  Return in about 6 weeks (around 04/18/2019) for Regular Follow Up, HTN, Obesity.

## 2019-03-07 ENCOUNTER — Other Ambulatory Visit: Payer: Self-pay

## 2019-03-07 ENCOUNTER — Encounter: Payer: Self-pay | Admitting: Adult Health

## 2019-03-07 ENCOUNTER — Ambulatory Visit (INDEPENDENT_AMBULATORY_CARE_PROVIDER_SITE_OTHER): Payer: BC Managed Care – PPO | Admitting: Adult Health

## 2019-03-07 VITALS — BP 134/94 | HR 83 | Temp 98.4°F | Ht 76.0 in | Wt 348.8 lb

## 2019-03-07 DIAGNOSIS — I10 Essential (primary) hypertension: Secondary | ICD-10-CM | POA: Diagnosis not present

## 2019-03-07 DIAGNOSIS — H01001 Unspecified blepharitis right upper eyelid: Secondary | ICD-10-CM | POA: Diagnosis not present

## 2019-03-07 DIAGNOSIS — B999 Unspecified infectious disease: Secondary | ICD-10-CM | POA: Diagnosis not present

## 2019-03-07 MED ORDER — DOXYCYCLINE HYCLATE 100 MG PO TABS: 100.0000 mg | ORAL_TABLET | Freq: Two times a day (BID) | ORAL | 0 refills | Status: DC

## 2019-03-07 NOTE — Assessment & Plan Note (Addendum)
BP 134/94, HR 83 Currently on Amlodipine 2.5mg  QD, HCTZ 25mg  QD

## 2019-03-07 NOTE — Assessment & Plan Note (Signed)
  Continue current medication regime. To treat the right eye stye/blephartitis and lower back skin infections-  Doxycycline 100mg  twice daily for 10 days. Referral to Infections Disease (ID) placed, imperative that you be seen by this specialist to address re-current skin infections. Continue warm compresses to right eye. Follow-up in 6 weeks for chronic medical conditions. COVID-19 Education: Continue to follow the Stay at Home order, and when out- Social Distance and wearing a facial mask when not at home.

## 2019-03-07 NOTE — Assessment & Plan Note (Signed)
Second referral to ID placed

## 2019-03-07 NOTE — Patient Instructions (Addendum)
Blepharitis Blepharitis is inflammation of the eyelids. Blepharitis may happen with:  Reddish, scaly skin around the scalp and eyebrows.  Burning or itching of the eyelids.  Eye discharge at night that causes the eyelashes to stick together in the morning.  Eyelashes that fall out.  Sensitivity to light. Follow these instructions at home: Pay attention to any changes in how your eyes look or feel. Tell your health care provider about any changes. Follow these instructions to help with your condition. Keeping Coca[employer]a your hands often.  Wash your eyelids with warm water or with warm water that is mixed with a small amount of baby shampoo. Do this two times per day or as often as needed.  Wash your face and eyebrows at least once a day.  Use a clean towel each time you dry your eyelids. Do not use this towel to clean or dry other areas of your body. Do not share your towel with anyone. General instructions  Avoid wearing makeup until you get better. Do not share makeup with anyone.  Avoid rubbing your eyes.  Apply warm compresses to your eyes 2 times per day for 10 minutes at a time, or as told by your health care provider.  If you were prescribed an antibiotic ointment or steroid drops, apply or use the medicine as told by your health care provider. Do not stop using the medicine even if you feel better.  Keep all follow-up visits as told by your health care provider. This is important. Contact a health care provider if:  Your eyelids feel hot.  You have blisters or a rash on your eyelids.  The condition does not go away in 2-4 days.  The inflammation gets worse. Get help right away if:  You have pain or redness that gets worse or spreads to other parts of your face.  Your vision changes.  You have pain when looking at lights or moving objects.  You have a fever. Summary  Blepharitis is inflammation of the eyelids.  Pay attention to any changes in how your  eyes look or feel. Tell your health care provider about any changes.  Follow home care instructions as told by your health care provider. Wash your hands often. Avoid wearing makeup. Do not rub your eyes.  To treat this condition, use warm compresses and prescription ointments or eye drops.  Let your health care provider know if you have vision changes, blisters or rash on eyelids, pain that spreads to your face, or warmth on your eyelids. This information is not intended to replace advice given to you by your health care provider. Make sure you discuss any questions you have with your health care provider. Document Released: 10/02/2000 Document Revised: 03/28/2018 Document Reviewed: 03/28/2018 Elsevier Interactive Patient Education  2019 [place] current medication regime. To treat the right eye stye/blephartitis and lower back skin infections-  Doxycycline 100mg  twice daily for 10 days. Referral to Infections Disease (ID) placed, imperative that you be seen by this specialist to address re-current skin infections. Continue warm compresses to right eye. Follow-up in 6 weeks for chronic medical conditions. COVID-19 Education: Continue to follow the Stay at Home order, and when out- Social Distance and wearing a facial mask when not at home. FEEL BETTER!

## 2019-03-15 ENCOUNTER — Other Ambulatory Visit: Payer: Self-pay

## 2019-03-15 ENCOUNTER — Encounter: Payer: BC Managed Care – PPO | Attending: Surgery | Admitting: Skilled Nursing Facility1

## 2019-03-15 DIAGNOSIS — E669 Obesity, unspecified: Secondary | ICD-10-CM | POA: Diagnosis not present

## 2019-03-15 NOTE — Progress Notes (Signed)
Pre-Op SW[facility] Visit:  Pre-Operative Sleeve Gastrectomy Surgery  Medical Nutrition Therapy:  Appt start time: 2:45  End time:  3:45  Referral Stated [facility] Appointments Required: 0  Proposed Surgery Type: Sleeve Gastrectomy   Pt expectation of surgery: to cure his anxiety and lack of sleeping at night: dietitian educated the pt on these things not being cured by the surgery directly   Pt expectation of dietitian: none stated   NUTRITION ASSESSMENT   Anthropometrics  Start weight at [facility]: 349 lbs Today's weight: 344 lbs BMI: 41.95 kg/m2     Psychosocial/Lifestyle  Pt is at risk for several micronutrient deficiencies such as vitamin C due to his smoking behaviors and lack of healthy diet.   Pt arrives having lost about 5 pounds. Pt states when he only drinks mountain dew he retains less water and when he drinks water he swells a lot. Pt states he has been trying to cut down on mountain dew (from 4-6 to 2 a day). Pt states he does not want to drink more water because it will swell him.  Pt states he feels like his knee has been ripped apart and unsure why it feels that way. Pt states he feels he is getting fuller faster for example half a pizza and instead of the whole. Pt states juice plus vitamins curbs his appetite.   Pt is more willing to microwave his foods than to use the stove due to his current living situation.   Medications: See list  Labs:  Notable Signs/Symptoms Back pain Nocturnal polyuria  Neuropathy Knee pain Anxiety leading to picking his arms causing wounds   24-Hr Dietary Recall: 2 meals a day First Meal: 3-4 sausage burrittos Snack:  Second Meal: half a cantaloupe  Snack:  Third Meal: 2 hamburgers  Snack:  Beverages: water, 4-6 12 ounce bottles mountain dew  Physical Activity  ADL's   Estimated Energy Needs Calories: 2200 Carbohydrate: 248 Protein: 165 Fat: 61   NUTRITION DIAGNOSIS  Overweight/obesity ([place]-3.3) related to past poor dietary habits  and physical inactivity as evidenced by patient w/ planned Sleeve surgery following dietary guidelines for continued weight loss.    NUTRITION INTERVENTION  Nutrition counseling (C-1) and education (E-2) to facilitate bariatric surgery goals.  Goals: -Try alkaline water -Keep working on limiting the [employer] great job!! -For breakfast bacon and eggs made in the microwave with a piece of fruit (1/4 of the cantaloupe) or tuna and grits or a microwaveable cup or cereal (limit to 2 cups) with fruit -Work on not eating out for breakfast  -Work on having a non starchy vegetable with dinner  Change readiness: contemplative   Demonstrated degree of understanding via: [place] intake, weekly physical activity, body weight, and pre-op goals reached.    Next Steps  Continued appt to help pt make lifestyle changes

## 2019-03-15 NOTE — Patient Instructions (Addendum)
-  Try alkaline water  -Keep working on limiting the [employer] great job!!  -For breakfast bacon and eggs made in the microwave with a piece of fruit (1/4 of the cantaloupe) or tuna and grits or a microwaveable cup or cereal (limit to 2 cups) with fruit  -Work on not eating out for breakfast   -Work on having a non starchy vegetable with dinner

## 2019-03-22 NOTE — Progress Notes (Signed)
Subjective:    Patient ID: Eduardo [name], male    DOB: 1970/11/29, 48 y.o.   MRN: [id number]  HPI:  Mr. Hamre presents with two acute issues: 1) Left knee pain, swelling, and warmth- spontaneously developed 2 weeks.  He denies acute injury/trauma prior to onset of sx's. Pain is constant, described as sharp, "ripping sensation", rated 10/10. 2) R great toe pain and swelling that also started 2 weeks without acute trauma. Pain is constant, described as sharp, rated 7/10 He has been using Percocet 7.5/325mg  prescribed by Phys Med. He denies consuming purine rich foods prior to onset of sx's. He estimates gout flares every 6 months. He reports difficulty ambulating due to pain and swelling. He has not used ice. He continues to use tobacco.  09/28/18 CMP  GFR 67 LFTs WNL  Of Note- He has appt with ID on Monday to address recurrent skin infections- great!  Patient Care Team    Relationship Specialty Notifications Start End  Mina Marble D, NP PCP - General Family Medicine  03/03/17   Buford Dresser, MD PCP - Cardiology Cardiology Admissions 07/18/18   Allyn Kenner, MD  Dermatology  04/06/17   Charlett Blake, MD Consulting Physician Physical Medicine and Rehabilitation  11/11/17     Patient Active Problem List   Diagnosis Date Noted  . Acute idiopathic gout of multiple sites 03/23/2019  . Recurrent infections 03/07/2019  . Blepharitis of right upper eyelid 03/07/2019  . Body mass index 40.0-44.9, adult ([place]) 04/19/2018  . Pain in left foot 04/19/2018  . Shortness of breath 04/07/2018  . Lower extremity edema 04/07/2018  . Stasis dermatitis of both legs 04/07/2018  . Hypokalemia 04/07/2018  . Healthcare maintenance 04/07/2018  . Great toe pain, left 03/08/2018  . Acute gout involving toe of left foot 03/08/2018  . Low libido 02/09/2018  . OSA (obstructive sleep apnea) 02/03/2018  . Status post nasal septoplasty 02/03/2018  . Chronic pansinusitis 12/07/2017   . Hypertrophy, nasal, turbinate 12/07/2017  . Deviated septum 11/11/2017  . Umbilical hernia without obstruction and without gangrene 11/11/2017  . Scrotal pain 11/11/2017  . Scrotal cyst 11/11/2017  . Screening, lipid 11/11/2017  . Screening for diabetes mellitus (DM) 11/11/2017  . Screening for thyroid disorder 11/11/2017  . Anxiety and depression 08/11/2017  . Acute maxillary sinusitis 08/11/2017  . 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 Lumbar degenerative disc disease 08/02/2017  . Acute left ankle pain 07/12/2017  . Skin infection 06/03/2017  . Chronic folliculitis 70/35/0093  . [place] 04/10/2017  . Cellulitis and abscess of trunk s/p I&D 04/10/2017 04/06/2017  . History of MRSA infection 04/06/2017  . Physical exam, annual 03/03/2017  . Neuropathy 09/28/2015  . Major depression 12/26/2012  . Generalized anxiety disorder 12/26/2012  . Opiate dependence ([place]) 12/24/2012  . Benzodiazepine dependence ([place]) 12/24/2012  . HTN (hypertension) 12/24/2012  . 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 Morbid obesity ([place]) 09/09/2010  . TOBACCO ABUSE 09/09/2010  . LOW BACK PAIN, CHRONIC 09/09/2010     Past Medical History:  Diagnosis Date  . Anxiety   . Chronic back pain   . Degenerative disk disease   . Depression   . DVT of upper extremity (deep vein thrombosis) ([place]   . GERD (gastroesophageal reflux disease)   . Hypertension   . MRSA (methicillin resistant staph aureus) culture positive   . Peripheral neuropathy   . Pilonidal cyst   . Pulmonary embolus ([place])    no blood thinner 2002  .  Sleep apnea    can't use cpap due to deviated nasal septumg     Past Surgical History:  Procedure Laterality Date  . Irrigation and debridement of back abscess    . NASAL SEPTOPLASTY W/ TURBINOPLASTY [name] 02/03/2018   Procedure: NASAL SEPTOPLASTY WITH TURBINATE REDUCTION;  Surgeon: [name], MD;  Location: [facility];  Service: ENT;  Laterality: [name];  . PILONIDAL CYST  DRAINAGE [name] 04/10/2017   Procedure: IRRIGATION AND DEBRIDEMENT BACK ABSCESS;  Surgeon: [name], MD;  Location: [facility];  Service: General;  Laterality: [name];  . SINUS ENDO WITH FUSION [name] 02/03/2018   Procedure: ENDOSCOPIC SINUS SURGERY WITH FUSION;  Surgeon: [name], MD;  Location: [facility];  Service: ENT;  Laterality: [name];  . THORACIC OUTLET SURGERY  2000  . WRIST FUSION  2002     Family History  Problem Relation Age of Onset  . Cancer Maternal Grandmother        breast, ovarian & colon  . Hypertension Mother   . Diabetes Mother   . Hypertension Father   . Heart disease Father   . Stroke Paternal Grandfather   . Neuropathy Neg Hx      Social History   Substance and Sexual Activity  Drug Use No     Social History   Substance and Sexual Activity  Alcohol Use No  . Alcohol/week: 0.0 standard drinks   Comment: Rarely     Social History   Tobacco Use  Smoking Status Current Every Day Smoker  . Packs/day: 1.00  . Years: 33.00  . Pack years: 33.00  . Types: Cigarettes  Smokeless Tobacco Never Used     Outpatient Encounter Medications as of 03/23/2019  Medication Sig Note  . albuterol (PROVENTIL HFA;VENTOLIN HFA) 108 (90 Base) MCG/ACT inhaler Inhale 2 puffs into the lungs every 6 (six) hours as needed for wheezing or shortness of breath.   [name] amLODipine (NORVASC) 5 MG tablet Take 1 tablet (5 mg total) by mouth daily.   . fluticasone (FLONASE) 50 MCG/ACT nasal spray SPRAY 2 SPRAYS IN EACH NOSTRIL DAILY   . montelukast (SINGULAIR) 10 MG tablet Take 1 tablet (10 mg total) by mouth at bedtime.   . Nutritional Supplements (JUICE PLUS FIBRE PO) Take 4 each by mouth daily with lunch. 01/24/2018: Fruit and vegatable  . oxyCODONE-acetaminophen (PERCOCET) 7.5-325 MG tablet Take 1 tablet by mouth every 6 (six) hours as needed.   . pregabalin (LYRICA) 300 MG capsule Take 1 capsule (300 mg total) by mouth 2 (two) times daily.   . [DISCONTINUED] amLODipine (NORVASC) 2.5 MG  tablet TAKE 1 TABLET (2.5 MG TOTAL) BY MOUTH DAILY.   . [DISCONTINUED] hydrochlorothiazide (HYDRODIURIL) 25 MG tablet Take 25 mg by mouth daily.   . colchicine 0.6 MG tablet 1 tab every 8 hrs day one, then 1 tab every 12 hrs until flare resolves   . predniSONE (DELTASONE) 20 MG tablet 2 tabs once daily for 5 days, then 1 tab daily for 2 days   . [DISCONTINUED] doxycycline (VIBRA-TABS) 100 MG tablet Take 1 tablet (100 mg total) by mouth 2 (two) times daily.   . [EXPIRED] methylPREDNISolone acetate (DEPO-MEDROL) injection 80 mg     No facility-administered encounter medications on file as of 03/23/2019.     Allergies: Pollen extract; Dust mite extract; and Other  Body mass index is 42.12 kg/m.  Blood pressure (!) 144/84, pulse 85, temperature 99.3 F (37.4 C), height 6\' 4"  (1.93 m), weight (!) 346 lb (156.9 kg),  SpO2 96 %.  Review of Systems  Constitutional: Positive for activity change and fatigue. Negative for appetite change, chills, diaphoresis, fever and unexpected weight change.  Eyes: Negative for visual disturbance.  Respiratory: Positive for shortness of breath. Negative for cough, chest tightness, wheezing and stridor.   Cardiovascular: Negative for chest pain, palpitations and leg swelling.  Endocrine: Negative for cold intolerance, heat intolerance, polydipsia, polyphagia and polyuria.  Musculoskeletal: Positive for arthralgias, back pain, gait problem, joint swelling, myalgias, neck pain and neck stiffness.  Skin: Negative for color change, pallor, rash and wound.  Hematological: Negative for adenopathy. Does not bruise/bleed easily.       Objective:   Physical Exam Vitals signs and nursing note reviewed.  Constitutional:      General: He is not in acute distress.    Appearance: He is obese. He is not ill-appearing, toxic-appearing or diaphoretic.  Cardiovascular:     Rate and Rhythm: Normal rate.     Pulses: Normal pulses.     Heart sounds: Normal heart sounds. No  murmur. No friction rub. No gallop.   Pulmonary:     Effort: Pulmonary effort is normal. No respiratory distress.     Breath sounds: No stridor. No wheezing, rhonchi or rales.  Chest:     Chest wall: No tenderness.  Musculoskeletal:        General: Swelling and tenderness present.     Left knee: He exhibits swelling. Tenderness found.     Right lower leg: No edema.     Left lower leg: No edema.     Right foot: Tenderness and swelling present.     Comments: R great toe wrapped in bandage unable to visualize skin Left knee very warm to the touch  Left knee has erythema   Skin:    General: Skin is warm and dry.     Capillary Refill: Capillary refill takes less than 2 seconds.     Findings: Erythema present.  Neurological:     Mental Status: He is alert and oriented to person, place, and time.  Psychiatric:        Mood and Affect: Mood normal.        Behavior: Behavior normal.        Thought Content: Thought content normal.        Judgment: Judgment normal.       Assessment & Plan:   1. Pain of lower extremity, unspecified laterality   2. Acute idiopathic gout of multiple sites     Acute idiopathic gout of multiple sites 1) Steroid injection given in clinic today, start oral steroidsTOMORROW 2) Follow care instructions as outlined above. 3) Please start oral steroids and colchicne as directed: 1 tab every 8 hrs day one, 1 tab every 12 hrs until flare resolves. 4) Remain well hydrated. 5) Continue narcotics as directed by Phys Med 6) Once this acute flare resolves, call clinic and I will start you on once daily Allopurinol 100mg  once daily. 7) Stop Amlodipine 2.5mg , start Amlodipine 5mg  8) Stop Hydrochlorothiazide 25mg - this can worsen gout. Continue to social distance and wear a mask when out in public.    FOLLOW-UP:  No follow-ups on file.

## 2019-03-23 ENCOUNTER — Ambulatory Visit (INDEPENDENT_AMBULATORY_CARE_PROVIDER_SITE_OTHER): Payer: BC Managed Care – PPO | Admitting: Adult Health

## 2019-03-23 ENCOUNTER — Other Ambulatory Visit: Payer: Self-pay

## 2019-03-23 ENCOUNTER — Telehealth: Payer: Self-pay | Admitting: Infectious Disease

## 2019-03-23 ENCOUNTER — Encounter: Payer: Self-pay | Admitting: Adult Health

## 2019-03-23 VITALS — BP 144/84 | HR 85 | Temp 99.3°F | Ht 76.0 in | Wt 346.0 lb

## 2019-03-23 DIAGNOSIS — M79606 Pain in leg, unspecified: Secondary | ICD-10-CM | POA: Diagnosis not present

## 2019-03-23 DIAGNOSIS — M1009 Idiopathic gout, multiple sites: Secondary | ICD-10-CM | POA: Insufficient documentation

## 2019-03-23 MED ORDER — METHYLPREDNISOLONE ACETATE 80 MG/ML IJ SUSP
80.0000 mg | Freq: Once | INTRAMUSCULAR | Status: AC
  Administered 2019-03-23: 80 mg via INTRAMUSCULAR

## 2019-03-23 MED ORDER — COLCHICINE 0.6 MG PO TABS: ORAL_TABLET | ORAL | 0 refills | Status: DC

## 2019-03-23 MED ORDER — PREDNISONE 20 MG PO TABS: ORAL_TABLET | ORAL | 0 refills | Status: DC

## 2019-03-23 MED ORDER — AMLODIPINE BESYLATE 5 MG PO TABS: 5.0000 mg | ORAL_TABLET | Freq: Every day | ORAL | 1 refills | Status: DC

## 2019-03-23 NOTE — Telephone Encounter (Signed)
[id number] Pre-Screening Questions: 03/23/19  Do you currently have a fever (>100 F), chills or unexplained body aches? NO  Are you currently experiencing new cough, shortness of breath, sore throat, runny nose? NO   Have you recently travelled outside the state of [place] in the last 14 days? NO   Have you been in contact with someone that is currently pending confirmation of Covid19 testing or has been confirmed to have the [place] virus?  NO  **If the patient answers NO to ALL questions -  advise the patient to please call the clinic before coming to the office should any symptoms develop.

## 2019-03-23 NOTE — Assessment & Plan Note (Signed)
1) Steroid injection given in clinic today, start oral steroidsTOMORROW 2) Follow care instructions as outlined above. 3) Please start oral steroids and colchicne as directed: 1 tab every 8 hrs day one, 1 tab every 12 hrs until flare resolves. 4) Remain well hydrated. 5) Continue narcotics as directed by Phys Med 6) Once this acute flare resolves, call clinic and I will start you on once daily Allopurinol 100mg  once daily. 7) Stop Amlodipine 2.5mg , start Amlodipine 5mg  8) Stop Hydrochlorothiazide 25mg - this can worsen gout. Continue to social distance and wear a mask when out in public.

## 2019-03-23 NOTE — Patient Instructions (Addendum)
Gout  Gout is a condition that causes painful swelling of the joints. Gout is a type of inflammation of the joints (arthritis). This condition is caused by having too much uric acid in the body. Uric acid is a chemical that forms when the body breaks down substances called purines. Purines are important for building body proteins. When the body has too much uric acid, sharp crystals can form and build up inside the joints. This causes pain and swelling. Gout attacks can happen quickly and may be very painful (acute gout). Over time, the attacks can affect more joints and become more frequent (chronic gout). Gout can also cause uric acid to build up under the skin and inside the kidneys. What are the causes? This condition is caused by too much uric acid in your blood. This can happen because:  Your kidneys do not remove enough uric acid from your blood. This is the most common cause.  Your body makes too much uric acid. This can happen with some cancers and cancer treatments. It can also occur if your body is breaking down too many red blood cells (hemolytic anemia).  You eat too many foods that are high in purines. These foods include organ meats and some seafood. Alcohol, especially beer, is also high in purines. A gout attack may be triggered by trauma or stress. What increases the risk? You are more likely to develop this condition if you:  Have a family history of gout.  Are male and middle-aged.  Are male and have gone through menopause.  Are obese.  Frequently drink alcohol, especially beer.  Are dehydrated.  Lose weight too quickly.  Have an organ transplant.  Have lead poisoning.  Take certain medicines, including aspirin, cyclosporine, diuretics, levodopa, and niacin.  Have kidney disease.  Have a skin condition called psoriasis. What are the signs or symptoms? An attack of acute gout happens quickly. It usually occurs in just one joint. The most common place is  the big toe. Attacks often start at night. Other joints that may be affected include joints of the feet, ankle, knee, fingers, wrist, or elbow. Symptoms of this condition may include:  Severe pain.  Warmth.  Swelling.  Stiffness.  Tenderness. The affected joint may be very painful to touch.  Shiny, red, or purple skin.  Chills and fever. Chronic gout may cause symptoms more frequently. More joints may be involved. You may also have white or yellow lumps (tophi) on your hands or feet or in other areas near your joints. How is this diagnosed? This condition is diagnosed based on your symptoms, medical history, and physical exam. You may have tests, such as:  Blood tests to measure uric acid levels.  Removal of joint fluid with a thin needle (aspiration) to look for uric acid crystals.  X-rays to look for joint damage. How is this treated? Treatment for this condition has two phases: treating an acute attack and preventing future attacks. Acute gout treatment may include medicines to reduce pain and swelling, including:  NSAIDs.  Steroids. These are strong anti-inflammatory medicines that can be taken by mouth (orally) or injected into a joint.  Colchicine. This medicine relieves pain and swelling when it is taken soon after an attack. It can be given by mouth or through an IV. Preventive treatment may include:  Daily use of smaller doses of NSAIDs or colchicine.  Use of a medicine that reduces uric acid levels in your blood.  Changes to your diet. You may   need to see a dietitian about what to eat and drink to prevent gout. Follow these instructions at home: During a gout attack   If directed, put ice on the affected area: ? Put ice in a plastic bag. ? Place a towel between your skin and the bag. ? Leave the ice on for 20 minutes, 2-3 times a day.  Raise (elevate) the affected joint above the level of your heart as often as possible.  Rest the joint as much as possible.  If the affected joint is in your leg, you may be given crutches to use.  Follow instructions from your health care provider about eating or drinking restrictions. Avoiding future gout attacks  Follow a low-purine diet as told by your dietitian or health care provider. Avoid foods and drinks that are high in purines, including liver, kidney, anchovies, asparagus, herring, mushrooms, mussels, and beer.  Maintain a healthy weight or lose weight if you are overweight. If you want to lose weight, talk with your health care provider. It is important that you do not lose weight too quickly.  Start or maintain an exercise program as told by your health care provider. Eating and drinking  Drink enough fluids to keep your urine pale yellow.  If you drink alcohol: ? Limit how much you use to:  0-1 drink a day for women.  0-2 drinks a day for men. ? Be aware of how much alcohol is in your drink. In the [place] one drink equals one 12 oz bottle of beer (355 mL) one 5 oz glass of wine (148 mL), or one 1 oz glass of hard liquor (44 mL). General instructions  Take over-the-counter and prescription medicines only as told by your health care provider.  Do not drive or use heavy machinery while taking prescription pain medicine.  Return to your normal activities as told by your health care provider. Ask your health care provider what activities are safe for you.  Keep all follow-up visits as told by your health care provider. This is important. Contact a health care provider if you have:  Another gout attack.  Continuing symptoms of a gout attack after 10 days of treatment.  Side effects from your medicines.  Chills or a fever.  Burning pain when you urinate.  Pain in your lower back or belly. Get help right away if you:  Have severe or uncontrolled pain.  Cannot urinate. Summary  Gout is painful swelling of the joints caused by inflammation.  The most common site of pain is the big  toe, but it can affect other joints in the body.  Medicines and dietary changes can help to prevent and treat gout attacks. This information is not intended to replace advice given to you by your health care provider. Make sure you discuss any questions you have with your health care provider. Document Released: 10/02/2000 Document Revised: 04/27/2018 Document Reviewed: 04/27/2018 Elsevier Interactive Patient Education  2019 [employer].  1) Steroid injection given in clinic today, start oral steroidsTOMORROW 2) Follow care instructions as outlined above. 3) Please start oral steroids and colchicne as directed: 1 tab every 8 hrs day one, 1 tab every 12 hrs until flare resolves. 4) Remain well hydrated. 5) Continue narcotics as directed by Phys Med 6) Once this acute flare resolves, call clinic and I will start you on once daily Allopurinol 100mg  once daily. 7) Stop Amlodipine 2.5mg , start Amlodipine 5mg  8) Stop Hydrochlorothiazide 25mg - this can worsen gout. Continue to social distance  and wear a mask when out in public. FEEL BETTER!

## 2019-03-27 ENCOUNTER — Ambulatory Visit: Payer: BC Managed Care – PPO | Admitting: Infectious Disease

## 2019-03-28 ENCOUNTER — Other Ambulatory Visit: Payer: Self-pay

## 2019-03-28 ENCOUNTER — Ambulatory Visit (INDEPENDENT_AMBULATORY_CARE_PROVIDER_SITE_OTHER): Payer: BC Managed Care – PPO | Admitting: Infectious Disease

## 2019-03-28 ENCOUNTER — Encounter: Payer: Self-pay | Admitting: Infectious Disease

## 2019-03-28 VITALS — BP 135/83 | HR 75 | Temp 98.0°F | Ht 77.0 in | Wt 350.0 lb

## 2019-03-28 DIAGNOSIS — B999 Unspecified infectious disease: Secondary | ICD-10-CM

## 2019-03-28 DIAGNOSIS — Z8614 Personal history of Methicillin resistant Staphylococcus aureus infection: Secondary | ICD-10-CM

## 2019-03-28 DIAGNOSIS — L089 Local infection of the skin and subcutaneous tissue, unspecified: Secondary | ICD-10-CM | POA: Diagnosis not present

## 2019-03-28 DIAGNOSIS — H01011 Ulcerative blepharitis right upper eyelid: Secondary | ICD-10-CM

## 2019-03-28 DIAGNOSIS — Z6841 Body Mass Index (BMI) 40.0 and over, adult: Secondary | ICD-10-CM

## 2019-03-28 DIAGNOSIS — L988 Other specified disorders of the skin and subcutaneous tissue: Secondary | ICD-10-CM

## 2019-03-28 MED ORDER — MUPIROCIN 2 % EX OINT: 1.0000 "application " | TOPICAL_OINTMENT | Freq: Two times a day (BID) | CUTANEOUS | 1 refills | Status: AC

## 2019-03-28 MED ORDER — DOXYCYCLINE HYCLATE 100 MG PO TABS: 100.0000 mg | ORAL_TABLET | Freq: Two times a day (BID) | ORAL | 2 refills | Status: DC

## 2019-03-28 NOTE — Patient Instructions (Signed)
PLEASE PURCHASE HIBICLENS ANTISEPTIC SOAP  SHOWER HEAD TO TOE NIGHTLY X  7DAYS  DO NOT APPLY DEODORANT OR OTHER CREAMS ONE HOUR AFTERWARDS  PLEASE ALSO APPLY MUPIROCIN TO INSIDE OF BOTH NOSTRILS USING A Q TIP TWICE DAILY X 7 DAYS  MAKE SURE YOU ALSO DO THIS THE WEEK BEFORE YOUR GASTRIC BYPASS SURGERY  PLEASE TELL YOUR SURGEONS RE YOUR HISTORY OF MRSA AND MAKE SURE YOU ARE GIVEN IV VANCOMYCIN PREOPERATIVELY

## 2019-03-28 NOTE — Progress Notes (Signed)
Subjective:   Reason for infectious disease consultation recurrent soft tissue infections with MRSA  Requesting physician: Kerby Nora, NP   Patient ID: Eduardo [name], male    DOB: June 01, 1971, 48 y.o.   MRN: [id number] HPI  [name] is a 48 year old man who suffers from hypertension obesity and recurrent MRSA infections.  He tells me that he has been suffering from these since around the year 2000.  He attributes some of this to problems with a pilonidal cyst that he developed for which she has not yet had surgery.  He has had outbreaks throughout on legs buttocks particular in the area where he has the pilonidal cyst, also on arms.  He recently completed doxycycline for a flare of blepharitis.  He does not appear to have areas in the armpits affected he states occasionally will have areas in the legs but not necessarily the groin.  He has tried decolonization with Hibiclens in the past but not bleach baths.  We discussed the idea of bleach baths but he believes he cannot get in and out of the bathtub and it will not fit in his current bathtub.  He is scheduled for gastric bypass in the next few months.  I can see that he also was tested for HIV and hepatitis C along with syphilis.  He states that this was done as part of routine care.  He denies being sexually active over the last few years.  He does not have a history of multiple sexual partners or high risk sex such as attempted sexual intercourse that with other men, with prostitutes or under the influence of drugs.  He does not have a history of sexually transmitted infections.   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  He resides at home with his stepmother.  He also has a dog.  He states that he is not have much physical interaction with his stepmother making it less likely that she could be a source of re-colonizing him.   Past Medical History:  Diagnosis Date  . Anxiety   . Chronic back pain    . Degenerative disk disease   . Depression   . DVT of upper extremity (deep vein thrombosis) ([facility])   . GERD (gastroesophageal reflux disease)   . Hypertension   . MRSA (methicillin resistant staph aureus) culture positive   . Peripheral neuropathy   . Pilonidal cyst   . Pulmonary embolus ([place])    no blood thinner 2002  . Sleep apnea    can't use cpap due to deviated nasal septumg    Past Surgical History:  Procedure Laterality Date  . Irrigation and debridement of back abscess    . NASAL SEPTOPLASTY W/ TURBINOPLASTY [name] 02/03/2018   Procedure: NASAL SEPTOPLASTY WITH TURBINATE REDUCTION;  Surgeon: [name], MD;  Location: [facility];  Service: ENT;  Laterality: [name];  . PILONIDAL CYST DRAINAGE [name] 04/10/2017   Procedure: IRRIGATION AND DEBRIDEMENT BACK ABSCESS;  Surgeon: [name], MD;  Location[facility];  Service: General;  Laterality: [name];  . SINUS ENDO WITH FUSION [name] 02/03/2018   Procedure: ENDOSCOPIC SINUS SURGERY WITH FUSION;  Surgeon: [name], MD;  Location: [facility];  Service: ENT;  Laterality: [name];  . THORACIC OUTLET SURGERY  2000  . WRIST FUSION  2002    Family History  Problem Relation Age of Onset  . Cancer Maternal Grandmother        breast, ovarian & colon  . Hypertension Mother   .  Diabetes Mother   . Hypertension Father   . Heart disease Father   . Stroke Paternal Grandfather   . Neuropathy Neg Hx       Social History   Socioeconomic History  . Marital status: Divorced    Spouse name: Not on file  . Number of children: 2  . Years of education: 12th   . Highest education level: Not on file  Occupational History  . Occupation: [name]  Social Needs  . Financial resource strain: Not on file  . Food insecurity:    Worry: Not on file    Inability: Not on file  . Transportation needs:    Medical: Not on file    Non-medical: Not on file  Tobacco Use  . Smoking status: Current Every Day Smoker    Packs/day: 1.00    Years: 33.00    Pack years: 33.00     Types: Cigarettes  . Smokeless tobacco: Never Used  Substance and Sexual Activity  . Alcohol use: No    Alcohol/week: 0.0 standard drinks    Comment: Rarely  . Drug use: No  . Sexual activity: Yes    Birth control/protection: None  Lifestyle  . Physical activity:    Days per week: Not on file    Minutes per session: Not on file  . Stress: Not on file  Relationships  . Social connections:    Talks on phone: Not on file    Gets together: Not on file    Attends religious service: Not on file    Active member of club or organization: Not on file    Attends meetings of clubs or organizations: Not on file    Relationship status: Not on file  Other Topics Concern  . Not on file  Social History Narrative   Lives at home w/ his step-mother   Right-handed   Drinks about 2-3 Mt Dews a day    Allergies  Allergen Reactions  . Pollen Extract Cough  . Dust Mite Extract Other (See Comments)  . Other Other (See Comments)    Ragweed Ragweed     Current Outpatient Medications:  .  albuterol (PROVENTIL HFA;VENTOLIN HFA) 108 (90 Base) MCG/ACT inhaler, Inhale 2 puffs into the lungs every 6 (six) hours as needed for wheezing or shortness of breath., Disp: 1 Inhaler, Rfl: 2 .  amLODipine (NORVASC) 5 MG tablet, Take 1 tablet (5 mg total) by mouth daily., Disp: 90 tablet, Rfl: 1 .  colchicine 0.6 MG tablet, 1 tab every 8 hrs day one, then 1 tab every 12 hrs until flare resolves, Disp: 30 tablet, Rfl: 0 .  fluticasone (FLONASE) 50 MCG/ACT nasal spray, SPRAY 2 SPRAYS IN EACH NOSTRIL DAILY, Disp: 16 g, Rfl: 6 .  montelukast (SINGULAIR) 10 MG tablet, Take 1 tablet (10 mg total) by mouth at bedtime., Disp: 90 tablet, Rfl: 3 .  Nutritional Supplements (JUICE PLUS FIBRE PO), Take 4 each by mouth daily with lunch., Disp: , Rfl:  .  oxyCODONE-acetaminophen (PERCOCET) 7.5-325 MG tablet, Take 1 tablet by mouth every 6 (six) hours as needed., Disp: 120 tablet, Rfl: 0 .  predniSONE (DELTASONE) 20 MG  tablet, 2 tabs once daily for 5 days, then 1 tab daily for 2 days, Disp: 12 tablet, Rfl: 0 .  pregabalin (LYRICA) 300 MG capsule, Take 1 capsule (300 mg total) by mouth 2 (two) times daily., Disp: 60 capsule, Rfl: 2 .  doxycycline (VIBRA-TABS) 100 MG tablet, Take 1 tablet (100 mg total) by  mouth 2 (two) times daily., Disp: 60 tablet, Rfl: 2 .  mupirocin ointment (BACTROBAN) 2 %, Place 1 application into the nose 2 (two) times daily for 7 days., Disp: 22 g, Rfl: 1     Review of Systems  Constitutional: Negative for chills and fever.  HENT: Negative for congestion, dental problem, drooling, ear discharge, rhinorrhea and sore throat.   Eyes: Negative for photophobia.  Respiratory: Negative for apnea, cough, choking, chest tightness, shortness of breath and wheezing.   Cardiovascular: Negative for chest pain, palpitations and leg swelling.  Gastrointestinal: Negative for abdominal pain, blood in stool, constipation, diarrhea, nausea and vomiting.  Genitourinary: Negative for dysuria, flank pain and hematuria.  Musculoskeletal: Negative for back pain and myalgias.  Skin: Positive for color change and rash.  Neurological: Negative for dizziness, weakness and headaches.  Hematological: Does not bruise/bleed easily.  Psychiatric/Behavioral: Negative for agitation, behavioral problems, confusion and suicidal ideas. The patient is nervous/anxious.        Objective:   Physical Exam Constitutional:      General: He is not in acute distress.    Appearance: He is not diaphoretic.  HENT:     Head: Normocephalic and atraumatic.     Right Ear: External ear normal.     Left Ear: External ear normal.     Nose: Nose normal.     Mouth/Throat:     Pharynx: No oropharyngeal exudate.  Eyes:     General: No scleral icterus.    Conjunctiva/sclera: Conjunctivae normal.  Neck:     Musculoskeletal: Normal range of motion and neck supple.  Cardiovascular:     Rate and Rhythm: Normal rate and regular  rhythm.     Heart sounds: No murmur.  Pulmonary:     Effort: Pulmonary effort is normal. No respiratory distress.     Breath sounds: No wheezing or rales.  Abdominal:     Palpations: Abdomen is soft.  Musculoskeletal: Normal range of motion.        General: No tenderness.  Lymphadenopathy:     Cervical: No cervical adenopathy.  Skin:    General: Skin is warm and dry.     Coloration: Skin is not pale.     Findings: Rash present. No erythema.  Neurological:     General: No focal deficit present.     Mental Status: He is alert and oriented to person, place, and time.     Coordination: Coordination normal.  Psychiatric:        Mood and Affect: Mood normal.        Behavior: Behavior normal.        Thought Content: Thought content normal.        Judgment: Judgment normal.    Has multiple hyperpigmented areas where he has had MRSA infection see pictures below dated March 28, 2019:  Right leg    Left leg    Left arm    Buttocks          Assessment & Plan:   Recurrent MRSA infections:  Would like it if he could do bleach baths with a cup of bleach in a bathtub with soaking for 15 minutes 3 times a week x2 to 3 months but this is not seem feasible given the size of his bathtub and his problems getting in and out of it.  Instead I will give him doxycycline 100 mg twice daily for the next 3 months.  I will ask him to try to do a decolonization regimen with  a 7-day course of bathing nightly with Hibiclens and twice daily intranasal mupirocin.  He should certainly repeat this decolonization attempt 7 days prior to elective surgery.  Furthermore he should definitely get vancomycin prior to surgery to reduce his risk of MRSA surgical site infection with his gastric bypass.  I do not see clear evidence for hidradenitis here  STi screening: Appropriately screened for HIV and hepatitis C but does not appear to be high risk for acquiring HIV he does not have a history of  injection drug use at least is what he relates to me.  I spent greater than 40 minutes with the patient including greater than 50% of time in face to face counsel of the patient regarding the nature of recurrent MRSA infections means to decolonize them and minimize risk from them.  And in coordination of his care.

## 2019-03-29 ENCOUNTER — Encounter: Payer: BC Managed Care – PPO | Attending: Physical Medicine & Rehabilitation | Admitting: Registered Nurse

## 2019-03-29 VITALS — BP 129/76 | HR 98 | Temp 98.5°F | Ht 76.0 in | Wt 352.6 lb

## 2019-03-29 DIAGNOSIS — M5416 Radiculopathy, lumbar region: Secondary | ICD-10-CM | POA: Diagnosis not present

## 2019-03-29 DIAGNOSIS — G894 Chronic pain syndrome: Secondary | ICD-10-CM | POA: Insufficient documentation

## 2019-03-29 DIAGNOSIS — Z5181 Encounter for therapeutic drug level monitoring: Secondary | ICD-10-CM | POA: Diagnosis not present

## 2019-03-29 DIAGNOSIS — G609 Hereditary and idiopathic neuropathy, unspecified: Secondary | ICD-10-CM

## 2019-03-29 DIAGNOSIS — M21612 Bunion of left foot: Secondary | ICD-10-CM | POA: Insufficient documentation

## 2019-03-29 DIAGNOSIS — M79672 Pain in left foot: Secondary | ICD-10-CM | POA: Insufficient documentation

## 2019-03-29 DIAGNOSIS — M21611 Bunion of right foot: Secondary | ICD-10-CM | POA: Insufficient documentation

## 2019-03-29 DIAGNOSIS — M25562 Pain in left knee: Secondary | ICD-10-CM

## 2019-03-29 DIAGNOSIS — M79671 Pain in right foot: Secondary | ICD-10-CM | POA: Diagnosis not present

## 2019-03-29 DIAGNOSIS — G608 Other hereditary and idiopathic neuropathies: Secondary | ICD-10-CM | POA: Diagnosis not present

## 2019-03-29 DIAGNOSIS — M5136 Other intervertebral disc degeneration, lumbar region: Secondary | ICD-10-CM

## 2019-03-29 DIAGNOSIS — Z79899 Other long term (current) drug therapy: Secondary | ICD-10-CM

## 2019-03-29 MED ORDER — OXYCODONE-ACETAMINOPHEN 7.5-325 MG PO TABS: 1.0000 | ORAL_TABLET | Freq: Four times a day (QID) | ORAL | 0 refills | Status: DC | PRN

## 2019-03-29 NOTE — Progress Notes (Signed)
Subjective:    Patient ID: Eduardo [name], male    DOB: 1971/06/28, 48 y.o.   MRN: [id number]  HPI: Eduardo [name] is a 48 y.o. male who returns for follow up appointment for chronic pain and medication refill. He states his pain is located in his lower back radiating into his lower extremities and left knee pain. Also reports left knee pain has increased in intensity and denies falling. X-ray ordered, he verbalizes understanding. He rates his pain 8.His current exercise regime is walking.  Mr. Dunlevy Morphine equivalent is 45.00  MME.  Last UDS was Performed on 11/08/2018, it was consistent.    Pain Inventory Average Pain 9 Pain Right Now 8 My pain is sharp, burning, stabbing, tingling and aching  In the last 24 hours, has pain interfered with the following? General activity 8 Relation with others 6 Enjoyment of life 10 What TIME of day is your pain at its worst? all Sleep (in general) Fair  Pain is worse with: walking, sitting and standing Pain improves with: rest and medication Relief from Meds: 8  Mobility walk without assistance how many minutes can you walk? 5-10 ability to climb steps?  yes do you drive?  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  yes  Physicians involved in your care Primary care Mina Marble, NP   Family History  Problem Relation Age of Onset  . Cancer Maternal Grandmother        breast, ovarian & colon  . Hypertension Mother   . Diabetes Mother   . Hypertension Father   . Heart disease Father   . Stroke Paternal Grandfather   . Neuropathy Neg Hx    Social History   Socioeconomic History  . Marital status: Divorced    Spouse name: Not on file  . Number of children: 2  . Years of education: 12th   . Highest education level: Not on file   Occupational History  . Occupation: [name]  Social Needs  . Financial resource strain: Not on file  . Food insecurity:    Worry: Not on file    Inability: Not on file  . Transportation needs:    Medical: Not on file    Non-medical: Not on file  Tobacco Use  . Smoking status: Current Every Day Smoker    Packs/day: 1.00    Years: 33.00    Pack years: 33.00    Types: Cigarettes  . Smokeless tobacco: Never Used  Substance and Sexual Activity  . Alcohol use: No    Alcohol/week: 0.0 standard drinks    Comment: Rarely  . Drug use: No  . Sexual activity: Yes    Birth control/protection: None  Lifestyle  . Physical activity:    Days per week: Not on file    Minutes per session: Not on file  . Stress: Not on file  Relationships  . Social connections:    Talks on phone: Not on file    Gets together: Not on file    Attends religious service: Not on file    Active member of club or organization: Not on file    Attends meetings of clubs or organizations: Not on file    Relationship status: Not on file  Other Topics Concern  . Not on file  Social History Narrative   Lives at home  w/ his step-mother   Right-handed   Drinks about 2-3 Mt Dews a day   Past Surgical History:  Procedure Laterality Date  . Irrigation and debridement of back abscess    . NASAL SEPTOPLASTY W/ TURBINOPLASTY [name] 02/03/2018   Procedure: NASAL SEPTOPLASTY WITH TURBINATE REDUCTION;  Surgeon: [name], MD;  Location: [facility];  Service: ENT;  Laterality: [name];  . PILONIDAL CYST DRAINAGE [name] 04/10/2017   Procedure: IRRIGATION AND DEBRIDEMENT BACK ABSCESS;  Surgeon: [name], MD;  [facility];  Service: General;  Laterality: [name];  . SINUS ENDO WITH FUSION [name] 02/03/2018   Procedure: ENDOSCOPIC SINUS SURGERY WITH FUSION;  Surgeon: [name], MD;  Location: [facility];  Service: ENT;  Laterality: [name];  . THORACIC OUTLET SURGERY  2000  . WRIST FUSION  2002   Past Medical History:  Diagnosis Date  . Anxiety    . Chronic back pain   . Degenerative disk disease   . Depression   . DVT of upper extremity (deep vein thrombosis) ([facility])   . GERD (gastroesophageal reflux disease)   . Hypertension   . MRSA (methicillin resistant staph aureus) culture positive   . Peripheral neuropathy   . Pilonidal cyst   . Pulmonary embolus ([facility])    no blood thinner 2002  . Sleep apnea    can't use cpap due to deviated nasal septumg   BP 129/76   Pulse 98   Temp 98.5 F (36.9 C)   Ht 6\' 4"  (1.93 m)   Wt (!) 352 lb 9.6 oz (159.9 kg)   SpO2 92%   BMI 42.92 kg/m   Opioid Risk Score:   Fall Risk Score:  `1  Depression screen PHQ 2/9  Depression screen [facility] 2/9 03/29/2019 03/28/2019 03/07/2019 03/02/2019 02/16/2019 01/24/2019 01/03/2019  Decreased Interest 0 0 2 0 0 3 1  Down, Depressed, Hopeless 0 0 1 0 0 1 1  PHQ - 2 Score 0 0 3 0 0 4 2  Altered sleeping - - 3 - - 3 -  Tired, decreased energy - - 3 - - 3 -  Change in appetite - - 2 - - 3 -  Feeling bad or failure about yourself  - - 0 - - 0 -  Trouble concentrating - - 1 - - 3 -  Moving slowly or fidgety/restless - - - - - 3 -  Suicidal thoughts - - 0 - - 0 -  PHQ-9 Score - - 12 - - 19 -  Difficult doing work/chores - - Very difficult - - Very difficult -  Some recent data might be hidden    Review of Systems  Constitutional: Negative.   HENT: Negative.   Eyes: Negative.   Respiratory: Positive for apnea and shortness of breath.   Cardiovascular: Negative.   Gastrointestinal: Negative.   Endocrine: Negative.   Genitourinary: Negative.   Musculoskeletal: Negative.   Skin: Negative.   Allergic/Immunologic: Negative.   Neurological: Negative.   Hematological: Negative.   Psychiatric/Behavioral: Negative.   All other systems reviewed and are negative.      Objective:   Physical Exam Vitals signs and nursing note reviewed.  Constitutional:      Appearance: Normal appearance. He is obese.  Neck:     Musculoskeletal: Normal range of motion and  neck supple.  Cardiovascular:     Rate and Rhythm: Normal rate and regular rhythm.     Pulses: Normal pulses.     Heart sounds: Normal heart sounds.  Pulmonary:     Effort: Pulmonary effort is normal.     Breath sounds: Normal breath sounds.  Musculoskeletal:     Comments: Normal Muscle Bulk and Muscle Testing Reveals:  Upper Extremities: Full ROM and Muscle Strength 5/5  Lumbar Paraspinal Tenderness: L-3-L-5 Lower Extremities: Decreased ROM and Muscle Strength 4/5  Bilateral Lower Extremities Flexion Produces Pain into extremities, bilateral patella's and bilateral ankles Arises from chair with ease Narrow Based Gait   Skin:    General: Skin is warm and dry.  Neurological:     Mental Status: He is alert and oriented to person, place, and time.  Psychiatric:        Mood and Affect: Mood normal.        Behavior: Behavior normal.           Assessment & Plan:  1. Chronic idiopathic axonal polyneuropathy: Continue Lyrica.03/29/2019. 2. Posterior tibial tendinitis of left lower extremity: Continue HEP as Tolerated. Continue to Monitor.03/29/2019 3. Lumbar DDD/ Lumbar Radiculitis: Continue current medication regime and HEP as Tolerated.03/29/2019 4. Chronic Pain Syndrome:Refilled: Oxycodone7.5/325mg  one tablet4 times daily as needed for pain. PerDr, Kirsteins note he'sunwilling to go any higher given his history in terms of total morphine equivalent dose.03/29/2019 We will continue the opioid monitoring program, this consists of regular clinic visits, examinations, urine drug screen, pill counts as well as use of [place] Controlled Substance Reporting system.  F/U in 1 month

## 2019-03-30 ENCOUNTER — Encounter: Payer: Self-pay | Admitting: Registered Nurse

## 2019-03-31 ENCOUNTER — Other Ambulatory Visit: Payer: Self-pay

## 2019-03-31 ENCOUNTER — Ambulatory Visit
Admission: RE | Admit: 2019-03-31 | Discharge: 2019-03-31 | Disposition: A | Discharge status: Home / Self Care | Payer: BC Managed Care – PPO | Source: Ambulatory Visit | Attending: Registered Nurse | Admitting: Registered Nurse

## 2019-04-03 ENCOUNTER — Telehealth: Payer: Self-pay | Admitting: Registered Nurse

## 2019-04-03 NOTE — Telephone Encounter (Signed)
Placed a call to Eduardo [name] regarding his X-ray, line busy, tried twice.

## 2019-04-04 ENCOUNTER — Telehealth: Payer: Self-pay

## 2019-04-04 NOTE — Telephone Encounter (Signed)
Patient called requesting results from xray done Friday. Also he states that he twisted his leg and something popped. It is hurting real bad and can't hardly work. Would like [name] to call him back.

## 2019-04-05 ENCOUNTER — Ambulatory Visit (HOSPITAL_COMMUNITY)
Admission: RE | Admit: 2019-04-05 | Discharge: 2019-04-05 | Disposition: A | Discharge status: Home / Self Care | Payer: BC Managed Care – PPO | Source: Ambulatory Visit | Attending: Surgery | Admitting: Surgery

## 2019-04-05 ENCOUNTER — Other Ambulatory Visit (HOSPITAL_COMMUNITY): Payer: Self-pay | Admitting: Surgery

## 2019-04-05 ENCOUNTER — Other Ambulatory Visit: Payer: Self-pay

## 2019-04-05 NOTE — Telephone Encounter (Signed)
Returned Eduardo [name] call, reviewed X-ray results. He reports he's having increase intensity of knee pain, he was instructed to F/U with his PCP or urgent care. He will call his PCP today.

## 2019-04-11 ENCOUNTER — Encounter: Payer: BC Managed Care – PPO | Attending: Surgery | Admitting: Skilled Nursing Facility1

## 2019-04-11 ENCOUNTER — Other Ambulatory Visit: Payer: Self-pay

## 2019-04-11 DIAGNOSIS — E669 Obesity, unspecified: Secondary | ICD-10-CM | POA: Insufficient documentation

## 2019-04-11 NOTE — Progress Notes (Signed)
Pre-Op SWL Visit:  Pre-Operative Sleeve Gastrectomy Surgery  Medical Nutrition Therapy:  Appt start time: 2:45  End time:  3:45  Referral Stated SWL Appointments Required: 0  Proposed Surgery Type: Sleeve Gastrectomy   Pt expectation of surgery: to cure his anxiety and lack of sleeping at night: dietitian educated the pt on these things not being cured by the surgery directly   Pt expectation of dietitian: none stated   NUTRITION ASSESSMENT   Anthropometrics  Start weight at [facility]: 349 lbs Today's weight: 350 lbs BMI: 42.63 kg/m2     Psychosocial/Lifestyle  Pt is at risk for several micronutrient deficiencies such as vitamin C due to his smoking behaviors and lack of healthy diet.   Pt is more willing to microwave his foods than to use the stove due to his current living situation.   Pt arrives having gained about 6 pounds. Pt states he has been eating breakfast and eating more throughout the day but less at once. Pt states he has been working regular hours resulting in knee and feet pain and maybe some torn cartledge in his left knee thusly experiencing increased emotional stress. Pt states since he has been working he has cut down on smoking due to less breaks but still smoking daily. Pt states he really needs help to quit smoking. Pt states he thinks he has daibetes due to a soar on his foot that will not heal (A1C 4.9 6 months ago). Pt sates he just met with his doctor a few weeks ago. Pt states he needs hypnosis to quit smoking. Pt states he uses chantix but that does not help. Pt states his step mom is trying to kick him out before he has surgery but he cannot afford to live on his own.  Dietitian sent [place]'s smoking cessation program link to pts email. Pt belives taking a musltivitamin is enough to assuage deficiencies dietitian educated the pt on supplements not being FDA regulated and only being a bridge to health not the absolute.   Dietitian will reach out to pts  physicians for ideas to better assist the pt.   Medications: See list  Labs:  Notable Signs/Symptoms Back pain Nocturnal polyuria  Neuropathy Knee pain Anxiety leading to picking his arms causing wounds   24-Hr Dietary Recall: 2 meals a day First Meal: yogurt and protein bars Snack:  Second Meal: half a cantaloupe  Snack:  Third Meal: 2 hamburgers  Snack:  Beverages: water, 4-6 12 ounce bottles mountain dew (continueing to lessen goal of 2 a day)  Physical Activity  ADL's   Estimated Energy Needs Calories: 2200 Carbohydrate: 248 Protein: 165 Fat: 61   NUTRITION DIAGNOSIS  Overweight/obesity ([place]-3.3) related to past poor dietary habits and physical inactivity as evidenced by patient w/ planned Sleeve surgery following dietary guidelines for continued weight loss.    NUTRITION INTERVENTION  Nutrition counseling (C-1) and education (E-2) to facilitate bariatric surgery goals.  Goals: -Try alkaline water -Keep working on limiting the [employer] great job!! A max of 2 a day -Look into [facility] cessation program   Change readiness: contemplative   Demonstrated degree of understanding via: Teach Back      MONITORING & EVALUATION Dietary intake, weekly physical activity, body weight, and pre-op goals reached.    Next Steps  Continued appt to help pt make lifestyle changes

## 2019-04-25 ENCOUNTER — Encounter
Payer: BC Managed Care – PPO | Attending: Physical Medicine & Rehabilitation | Admitting: Physical Medicine & Rehabilitation

## 2019-04-25 ENCOUNTER — Encounter: Payer: Self-pay | Admitting: Physical Medicine & Rehabilitation

## 2019-04-25 ENCOUNTER — Other Ambulatory Visit: Payer: Self-pay

## 2019-04-25 VITALS — BP 129/80 | HR 97 | Temp 97.5°F | Ht 76.0 in | Wt 348.0 lb

## 2019-04-25 DIAGNOSIS — L84 Corns and callosities: Secondary | ICD-10-CM

## 2019-04-25 DIAGNOSIS — M79672 Pain in left foot: Secondary | ICD-10-CM | POA: Insufficient documentation

## 2019-04-25 DIAGNOSIS — E538 Deficiency of other specified B group vitamins: Secondary | ICD-10-CM

## 2019-04-25 DIAGNOSIS — M21612 Bunion of left foot: Secondary | ICD-10-CM | POA: Diagnosis not present

## 2019-04-25 DIAGNOSIS — G608 Other hereditary and idiopathic neuropathies: Secondary | ICD-10-CM | POA: Diagnosis not present

## 2019-04-25 DIAGNOSIS — M79671 Pain in right foot: Secondary | ICD-10-CM | POA: Insufficient documentation

## 2019-04-25 DIAGNOSIS — M21611 Bunion of right foot: Secondary | ICD-10-CM | POA: Diagnosis not present

## 2019-04-25 DIAGNOSIS — G609 Hereditary and idiopathic neuropathy, unspecified: Secondary | ICD-10-CM

## 2019-04-25 DIAGNOSIS — G894 Chronic pain syndrome: Secondary | ICD-10-CM | POA: Diagnosis not present

## 2019-04-25 MED ORDER — OXYCODONE-ACETAMINOPHEN 7.5-325 MG PO TABS: 1.0000 | ORAL_TABLET | Freq: Four times a day (QID) | ORAL | 0 refills | Status: DC | PRN

## 2019-04-25 MED ORDER — PREGABALIN 300 MG PO CAPS: 300.0000 mg | ORAL_CAPSULE | Freq: Two times a day (BID) | ORAL | 2 refills | Status: DC

## 2019-04-25 NOTE — Patient Instructions (Signed)
Referral to Podiatry to trim callus and provide foot care

## 2019-04-25 NOTE — Progress Notes (Signed)
Subjective:    Patient ID: Eduardo [name], male    DOB: March 20, 1971, 48 y.o.   MRN: [id number]  HPI   48 yo male with chronic axonal polyneuropathy as well as achilles tendinitis on Left side  Patient has maintained adequate functioning with full-time employment while taking oxycodone 7.5 mg 4 times daily.  In addition he takes Lyrica 300 mg twice daily.  He has not had much in terms of side effects such as constipation.  He has had some chronic lower extremity swelling but is also taking amlodipine.  Left knee pain, diagnosed with mensical tear at [facility] partial relief with Cortisone injection   Was also given cortisone tabs #12 for Left knee pain  On doxycycline for "skin infections" that pop up    Pain Inventory Average Pain 7 Pain Right Now 9 My pain is sharp, burning, stabbing and tingling  In the last 24 hours, has pain interfered with the following? General activity 9 Relation with others 9 Enjoyment of life 10 What TIME of day is your pain at its worst? all Sleep (in general) Poor  Pain is worse with: walking, sitting, inactivity and standing Pain improves with: rest and medication Relief from Meds: 8  Mobility walk without assistance how many minutes can you walk? 5 ability to climb steps?  yes do you drive?  yes  Function employed # of hrs/week 40  Neuro/Psych weakness numbness tremor tingling trouble walking spasms confusion depression  Prior Studies x-rays  Physicians involved in your care Any changes since last visit?  no   Family History  Problem Relation Age of Onset  . Cancer Maternal Grandmother        breast, ovarian & colon  . Hypertension Mother   . Diabetes Mother   . Hypertension Father   . Heart disease Father   . Stroke Paternal Grandfather   . Neuropathy Neg Hx    Social History   Socioeconomic History  . Marital status: Divorced    Spouse name: Not on file  . Number of children: 2  . Years of education:  12th   . Highest education level: Not on file  Occupational History  . Occupation: [name]  Social Needs  . Financial resource strain: Not on file  . Food insecurity    Worry: Not on file    Inability: Not on file  . Transportation needs    Medical: Not on file    Non-medical: Not on file  Tobacco Use  . Smoking status: Current Every Day Smoker    Packs/day: 1.00    Years: 33.00    Pack years: 33.00    Types: Cigarettes  . Smokeless tobacco: Never Used  Substance and Sexual Activity  . Alcohol use: No    Alcohol/week: 0.0 standard drinks    Comment: Rarely  . Drug use: No  . Sexual activity: Yes    Birth control/protection: None  Lifestyle  . Physical activity    Days per week: Not on file    Minutes per session: Not on file  . Stress: Not on file  Relationships  . Social Herbalist on phone: Not on file    Gets together: Not on file    Attends religious service: Not on file    Active member of club or organization: Not on file    Attends meetings of clubs or organizations: Not on file    Relationship status: Not on file  Other Topics Concern  .  Not on file  Social History Narrative   Lives at home w/ his step-mother   Right-handed   Drinks about 2-3 Mt Dews a day   Past Surgical History:  Procedure Laterality Date  . Irrigation and debridement of back abscess    . NASAL SEPTOPLASTY W/ TURBINOPLASTY [name] 02/03/2018   Procedure: NASAL SEPTOPLASTY WITH TURBINATE REDUCTION;  Surgeon: [name], MD;  Location: [facility];  Service: ENT;  Laterality: [name];  . PILONIDAL CYST DRAINAGE [name] 04/10/2017   Procedure: IRRIGATION AND DEBRIDEMENT BACK ABSCESS;  Surgeon: [name], MD;  Location: [facility];  Service: General;  Laterality: [name];  . SINUS ENDO WITH FUSION [name] 02/03/2018   Procedure: ENDOSCOPIC SINUS SURGERY WITH FUSION;  Surgeon: [name], MD;  Location: [facility];  Service: ENT;  Laterality: [name];  . THORACIC OUTLET SURGERY  2000  . WRIST FUSION  2002   Past  Medical History:  Diagnosis Date  . Anxiety   . Chronic back pain   . Degenerative disk disease   . Depression   . DVT of upper extremity (deep vein thrombosis) ([place])   . GERD (gastroesophageal reflux disease)   . Hypertension   . MRSA (methicillin resistant staph aureus) culture positive   . Peripheral neuropathy   . Pilonidal cyst   . Pulmonary embolus ([place])    no blood thinner 2002  . Sleep apnea    can't use cpap due to deviated nasal septumg   BP 129/80   Pulse 97   Temp (!) 97.5 F (36.4 C)   Ht 6\' 4"  (1.93 m)   Wt (!) 348 lb (157.9 kg)   SpO2 93%   BMI 42.36 kg/m   Opioid Risk Score:   Fall Risk Score:  `1  Depression screen PHQ 2/9  Depression screen [facility] 2/9 04/25/2019 03/29/2019 03/28/2019 03/07/2019 03/02/2019 02/16/2019 01/24/2019  Decreased Interest 0 0 0 2 0 0 3  Down, Depressed, Hopeless 0 0 0 1 0 0 1  PHQ - 2 Score 0 0 0 3 0 0 4  Altered sleeping - - - 3 - - 3  Tired, decreased energy - - - 3 - - 3  Change in appetite - - - 2 - - 3  Feeling bad or failure about yourself  - - - 0 - - 0  Trouble concentrating - - - 1 - - 3  Moving slowly or fidgety/restless - - - - - - 3  Suicidal thoughts - - - 0 - - 0  PHQ-9 Score - - - 12 - - 19  Difficult doing work/chores - - - Very difficult - - Very difficult  Some recent data might be hidden    Review of Systems  Constitutional: Negative.   HENT: Negative.   Eyes: Negative.   Respiratory: Positive for apnea and shortness of breath.   Cardiovascular: Negative.   Gastrointestinal: Negative.   Endocrine: Negative.   Genitourinary: Negative.   Musculoskeletal: Positive for gait problem.  Skin: Negative.   Allergic/Immunologic: Negative.   Neurological: Positive for tremors, weakness and numbness.  Hematological: Negative.   Psychiatric/Behavioral: Negative.   All other systems reviewed and are negative.      Objective:   Physical Exam Vitals signs and nursing note reviewed.  Constitutional:       Appearance: He is obese.  HENT:     Head: Normocephalic and atraumatic.     Nose: Nose normal.  Eyes:     Extraocular Movements: Extraocular movements intact.  Conjunctiva/sclera: Conjunctivae normal.     Pupils: Pupils are equal, round, and reactive to light.  Skin:    General: Skin is warm and dry.     Comments: Stasis dermatitis bilateral pre-tibial area  Neurological:     Mental Status: He is alert and oriented to person, place, and time.     Motor: Atrophy present.     Comments: 5/5 bilateral hip flexor knee extensor ankle dorsiflexor plantar flexor.  His toe flexors extensors are 4- Bilateral foot intrinsic atrophy is noted Sensation is reduced below the knees to pinprick bilateral laterally    The patient has a 2 to 3 cm diameter callus at the plantar aspect of his right great toe.  There is no surrounding erythema or drainage from the center which has a shallower depth. There is no pain with joint movement in the great toe.       Assessment & Plan:  1.  History of chronic painful axonal poly-neuropathy of undetermined etiology. The patient has foot intrinsic atrophy as well as severe sensory deficits in both feet.  He does have increased risk of foot infection despite not having diabetes. Will make referral to podiatry to debride right great toe callus.  Physical medicine rehab follow-up in 1 month continue oxycodone 7.5 4 times daily in combination with Lyrica 300 mg twice daily.

## 2019-04-28 ENCOUNTER — Encounter: Payer: BC Managed Care – PPO | Admitting: Physical Medicine & Rehabilitation

## 2019-05-05 ENCOUNTER — Ambulatory Visit (INDEPENDENT_AMBULATORY_CARE_PROVIDER_SITE_OTHER): Payer: BC Managed Care – PPO

## 2019-05-05 ENCOUNTER — Ambulatory Visit: Payer: BC Managed Care – PPO | Admitting: Podiatry

## 2019-05-05 ENCOUNTER — Encounter: Payer: Self-pay | Admitting: Podiatry

## 2019-05-05 ENCOUNTER — Other Ambulatory Visit: Payer: Self-pay

## 2019-05-05 VITALS — BP 120/64 | Temp 97.1°F

## 2019-05-05 DIAGNOSIS — G609 Hereditary and idiopathic neuropathy, unspecified: Secondary | ICD-10-CM

## 2019-05-05 DIAGNOSIS — M79672 Pain in left foot: Secondary | ICD-10-CM

## 2019-05-05 DIAGNOSIS — E1142 Type 2 diabetes mellitus with diabetic polyneuropathy: Secondary | ICD-10-CM | POA: Diagnosis not present

## 2019-05-05 DIAGNOSIS — M19079 Primary osteoarthritis, unspecified ankle and foot: Secondary | ICD-10-CM | POA: Diagnosis not present

## 2019-05-05 DIAGNOSIS — L97511 Non-pressure chronic ulcer of other part of right foot limited to breakdown of skin: Secondary | ICD-10-CM | POA: Diagnosis not present

## 2019-05-05 DIAGNOSIS — M79674 Pain in right toe(s): Secondary | ICD-10-CM

## 2019-05-05 DIAGNOSIS — S92411S Displaced fracture of proximal phalanx of right great toe, sequela: Secondary | ICD-10-CM

## 2019-05-05 DIAGNOSIS — M79671 Pain in right foot: Secondary | ICD-10-CM

## 2019-05-05 NOTE — Patient Instructions (Signed)
Pre-Operative Instructions  Congratulations, you have decided to take an important step towards improving your quality of life.  You can be assured that the doctors and staff at [facility] will be with you every step of the way.  Here are some important things you should know:  1. Plan to be at the surgery center/hospital at least 1 (one) hour prior to your scheduled time, unless otherwise directed by the surgical center/hospital staff.  You must have a responsible adult accompany you, remain during the surgery and drive you home.  Make sure you have directions to the surgical center/hospital to ensure you arrive on time. 2. If you are having surgery at [facility] or [facility] hospitals, you will need a copy of your medical history and physical form from your family physician within one month prior to the date of surgery. We will give you a form for your primary physician to complete.  3. We make every effort to accommodate the date you request for surgery.  However, there are times where surgery dates or times have to be moved.  We will contact you as soon as possible if a change in schedule is required.   4. No aspirin/ibuprofen for one week before surgery.  If you are on aspirin, any non-steroidal anti-inflammatory medications (Mobic, Aleve, Ibuprofen) should not be taken seven (7) days prior to your surgery.  You make take Tylenol for pain prior to surgery.  5. Medications - If you are taking daily heart and blood pressure medications, seizure, reflux, allergy, asthma, anxiety, pain or diabetes medications, make sure you notify the surgery center/hospital before the day of surgery so they can tell you which medications you should take or avoid the day of surgery. 6. No food or drink after midnight the night before surgery unless directed otherwise by surgical center/hospital staff. 7. No alcoholic beverages 24-hours prior to surgery.  No smoking 24-hours prior or 24-hours after  surgery. 8. Wear loose pants or shorts. They should be loose enough to fit over bandages, boots, and casts. 9. Don't wear slip-on shoes. Sneakers are preferred. 10. Bring your boot with you to the surgery center/hospital.  Also bring crutches or a walker if your physician has prescribed it for you.  If you do not have this equipment, it will be provided for you after surgery. 11. If you have not been contacted by the surgery center/hospital by the day before your surgery, call to confirm the date and time of your surgery. 12. Leave-time from work may vary depending on the type of surgery you have.  Appropriate arrangements should be made prior to surgery with your employer. 13. Prescriptions will be provided immediately following surgery by your doctor.  Fill these as soon as possible after surgery and take the medication as directed. Pain medications will not be refilled on weekends and must be approved by the doctor. 14. Remove nail polish on the operative foot and avoid getting pedicures prior to surgery. 15. Wash the night before surgery.  The night before surgery wash the foot and leg well with water and the antibacterial soap provided. Be sure to pay special attention to beneath the toenails and in between the toes.  Wash for at least three (3) minutes. Rinse thoroughly with water and dry well with a towel.  Perform this wash unless told not to do so by your physician.  Enclosed: 1 Ice pack (please put in freezer the night before surgery)   1 Hibiclens skin cleaner     Pre-op instructions  If you have any questions regarding the instructions, please do not hesitate to call our office.  [place]: [address], [place], [place] [address] -- [phone]  [place]: [address] [place], [place] [address] -- [phone]  [place]: [address]  , [place] [address] -- [phone]  [place]: [address], [address], [place], [place] [address] -- [phone]  Website:  [email or web address] 

## 2019-05-11 NOTE — Progress Notes (Signed)
Subjective:    Patient ID: Eduardo [name], male    DOB: December 06, 1970, 48 y.o.   MRN: [id number]  HPI:  Eduardo [name] presents for surgical clearance for R great toe surgery-with Dr. Elspeth Cho He has reduced tobacco use to 1/2 pack a day- currently on Chantix 1mg  BID He reports stable mood, denies SI/HI He denies CP/palpitations/HA/dizziness/palpitations He denies increase in dyspnea He denies acute complaints today He has not been wearing mask when out in the community due, states "makes me feel hot and it hard to breath". Discussed the risks of COVID exposure when not wearing a mask in public- encouraged to wear a mask AT ALL TIMES  Patient Care Team    Relationship Specialty Notifications Start End  Mina Marble D, NP PCP - General Family Medicine  03/03/17   Buford Dresser, MD PCP - Cardiology Cardiology Admissions 07/18/18   Allyn Kenner, MD  Dermatology  04/06/17   Charlett Blake, MD Consulting Physician Physical Medicine and Rehabilitation  11/11/17     Patient Active Problem List   Diagnosis Date Noted  . Surgery, elective 05/16/2019  . Acute idiopathic gout of multiple sites 03/23/2019  . Recurrent infections 03/07/2019  . Blepharitis of right upper eyelid 03/07/2019  . Body mass index 40.0-44.9, adult ([place]) 04/19/2018  . Pain in left foot 04/19/2018  . Shortness of breath 04/07/2018  . Lower extremity edema 04/07/2018  . Stasis dermatitis of both legs 04/07/2018  . Hypokalemia 04/07/2018  . Healthcare maintenance 04/07/2018  . Great toe pain, left 03/08/2018  . Acute gout involving toe of left foot 03/08/2018  . Low libido 02/09/2018  . OSA (obstructive sleep apnea) 02/03/2018  . Status post nasal septoplasty 02/03/2018  . Chronic pansinusitis 12/07/2017  . Hypertrophy, nasal, turbinate 12/07/2017  . Deviated septum 11/11/2017  . Umbilical hernia without obstruction and without gangrene 11/11/2017  . Scrotal pain 11/11/2017  . Scrotal cyst  11/11/2017  . Screening, lipid 11/11/2017  . Screening for diabetes mellitus (DM) 11/11/2017  . Screening for thyroid disorder 11/11/2017  . Anxiety and depression 08/11/2017  . Acute maxillary sinusitis 08/11/2017  . 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 Lumbar degenerative disc disease 08/02/2017  . Acute left ankle pain 07/12/2017  . Skin infection 06/03/2017  . Chronic folliculitis 04/17/1600  . [place] 04/10/2017  . Cellulitis and abscess of trunk s/p I&D 04/10/2017 04/06/2017  . History of MRSA infection 04/06/2017  . Physical exam, annual 03/03/2017  . Neuropathy 09/28/2015  . Major depression 12/26/2012  . Generalized anxiety disorder 12/26/2012  . Opiate dependence ([place]) 12/24/2012  . Benzodiazepine dependence ([place]) 12/24/2012  . HTN (hypertension) 12/24/2012  . 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 Morbid obesity ([place]) 09/09/2010  . TOBACCO ABUSE 09/09/2010  . LOW BACK PAIN, CHRONIC 09/09/2010     Past Medical History:  Diagnosis Date  . Anxiety   . Chronic back pain   . Degenerative disk disease   . Depression   . DVT of upper extremity (deep vein thrombosis) ([place])   . GERD (gastroesophageal reflux disease)   . Hypertension   . MRSA (methicillin resistant staph aureus) culture positive   . Peripheral neuropathy   . Pilonidal cyst   . Pulmonary embolus ([place])    no blood thinner 2002  . Sleep apnea    can't use cpap due to deviated nasal septumg     Past Surgical History:  Procedure Laterality Date  . Irrigation and debridement of back abscess    .  NASAL SEPTOPLASTY W/ TURBINOPLASTY [name] 02/03/2018   Procedure: NASAL SEPTOPLASTY WITH TURBINATE REDUCTION;  Surgeon: [name], MD;  Location: [facility];  Service: ENT;  Laterality: [name];  . PILONIDAL CYST DRAINAGE [name] 04/10/2017   Procedure: IRRIGATION AND DEBRIDEMENT BACK ABSCESS;  Surgeon: [name], MD;  Location[facility];  Service: General;  Laterality: [name];  . SINUS ENDO WITH FUSION [name]  02/03/2018   Procedure: ENDOSCOPIC SINUS SURGERY WITH FUSION;  Surgeon: [name], MD;  Location: [facility];  Service: ENT;  Laterality: [name];  . THORACIC OUTLET SURGERY  2000  . WRIST FUSION  2002     Family History  Problem Relation Age of Onset  . Cancer Maternal Grandmother        breast, ovarian & colon  . Hypertension Mother   . Diabetes Mother   . Hypertension Father   . Heart disease Father   . Stroke Paternal Grandfather   . Neuropathy Neg Hx      Social History   Substance and Sexual Activity  Drug Use No     Social History   Substance and Sexual Activity  Alcohol Use No  . Alcohol/week: 0.0 standard drinks   Comment: Rarely     Social History   Tobacco Use  Smoking Status Current Every Day Smoker  . Packs/day: 1.00  . Years: 33.00  . Pack years: 33.00  . Types: Cigarettes  Smokeless Tobacco Never Used     Outpatient Encounter Medications as of 05/15/2019  Medication Sig Note  . albuterol (PROVENTIL HFA;VENTOLIN HFA) 108 (90 Base) MCG/ACT inhaler Inhale 2 puffs into the lungs every 6 (six) hours as needed for wheezing or shortness of breath.   [name] amLODipine (NORVASC) 5 MG tablet Take 1 tablet (5 mg total) by mouth daily.   [name] doxycycline (VIBRA-TABS) 100 MG tablet Take 1 tablet (100 mg total) by mouth 2 (two) times daily.   . fluticasone (FLONASE) 50 MCG/ACT nasal spray SPRAY 2 SPRAYS IN EACH NOSTRIL DAILY   . montelukast (SINGULAIR) 10 MG tablet Take 1 tablet (10 mg total) by mouth at bedtime.   . Nutritional Supplements (JUICE PLUS FIBRE PO) Take 4 each by mouth daily with lunch. 01/24/2018: Fruit and vegatable  . oxyCODONE-acetaminophen (PERCOCET) 7.5-325 MG tablet Take 1 tablet by mouth every 6 (six) hours as needed.   . pregabalin (LYRICA) 300 MG capsule Take 1 capsule (300 mg total) by mouth 2 (two) times daily.   . varenicline (CHANTIX) 1 MG tablet Take 1 mg by mouth 2 (two) times daily.   . [DISCONTINUED] amLODipine (NORVASC) 5 MG tablet Take 1  tablet (5 mg total) by mouth daily.   . [DISCONTINUED] CHANTIX CONTINUING MONTH PAK 1 MG tablet    . [DISCONTINUED] colchicine 0.6 MG tablet 1 tab every 8 hrs day one, then 1 tab every 12 hrs until flare resolves   . [DISCONTINUED] predniSONE (DELTASONE) 20 MG tablet 2 tabs once daily for 5 days, then 1 tab daily for 2 days    No facility-administered encounter medications on file as of 05/15/2019.     Allergies: Pollen extract, Dust mite extract, and Other  Body mass index is 42.53 kg/m.  Blood pressure 126/73, pulse 76, temperature 99 F (37.2 C), temperature source Oral, height 6\' 4"  (1.93 m), weight (!) 349 lb 6.4 oz (158.5 kg), SpO2 96 %.     Review of Systems  Constitutional: Positive for fatigue. Negative for activity change, appetite change, chills, diaphoresis, fever and unexpected weight change.  HENT: Negative for congestion.   Eyes: Negative for visual disturbance.  Respiratory: Negative for cough, chest tightness, shortness of breath, wheezing and stridor.   Cardiovascular: Negative for chest pain, palpitations and leg swelling.  Gastrointestinal: Negative for abdominal distention, abdominal pain, blood in stool, constipation, diarrhea, nausea and vomiting.  Genitourinary: Negative for difficulty urinating and flank pain.  Musculoskeletal: Positive for arthralgias, back pain, gait problem and myalgias. Negative for joint swelling, neck pain and neck stiffness.  Skin: Negative for color change, pallor, rash and wound.  Neurological: Negative for dizziness and headaches.  Hematological: Negative for adenopathy. Does not bruise/bleed easily.  Psychiatric/Behavioral: Negative for agitation, behavioral problems, confusion, decreased concentration, dysphoric mood, hallucinations, self-injury, sleep disturbance and suicidal ideas. The patient is not nervous/anxious and is not hyperactive.        Objective:   Physical Exam Vitals signs and nursing note reviewed.   Constitutional:      General: He is not in acute distress.    Appearance: He is obese. He is not ill-appearing, toxic-appearing or diaphoretic.  HENT:     Head: Normocephalic and atraumatic.  Cardiovascular:     Rate and Rhythm: Normal rate and regular rhythm.     Pulses: Normal pulses.     Heart sounds: Normal heart sounds. No murmur. No friction rub. No gallop.   Pulmonary:     Effort: Pulmonary effort is normal. No respiratory distress.     Breath sounds: Normal breath sounds. No stridor. No wheezing, rhonchi or rales.  Chest:     Chest wall: No tenderness.  Skin:    General: Skin is warm and dry.     Capillary Refill: Capillary refill takes less than 2 seconds.  Neurological:     Mental Status: He is alert and oriented to person, place, and time.  Psychiatric:        Mood and Affect: Mood normal.        Behavior: Behavior normal.        Thought Content: Thought content normal.        Judgment: Judgment normal.       Assessment & Plan:   1. Surgery, elective     Surgery, elective Surgical clearance for R great toe surgery-with Dr. Elspeth Cho Continue all medications as directed. Remain well hydrated, follow heart healthy diet. EKG- Normal Continue to reduce to stop tobacco use- YOU CAN DO IT! Please call  Dr. Elspeth Cho and inquire how many days prior to surgery do you need the COVID test, tests are taking 2-7 days to result currently. Social distance and wear a mask when in public. Good luck with the surgery!    FOLLOW-UP:  Return if symptoms worsen or fail to improve.

## 2019-05-15 ENCOUNTER — Encounter: Payer: Self-pay | Admitting: Adult Health

## 2019-05-15 ENCOUNTER — Ambulatory Visit (INDEPENDENT_AMBULATORY_CARE_PROVIDER_SITE_OTHER): Payer: BC Managed Care – PPO | Admitting: Adult Health

## 2019-05-15 ENCOUNTER — Other Ambulatory Visit: Payer: Self-pay

## 2019-05-15 VITALS — BP 126/73 | HR 76 | Temp 99.0°F | Ht 76.0 in | Wt 349.4 lb

## 2019-05-15 DIAGNOSIS — Z419 Encounter for procedure for purposes other than remedying health state, unspecified: Secondary | ICD-10-CM

## 2019-05-15 MED ORDER — AMLODIPINE BESYLATE 5 MG PO TABS: 5.0000 mg | ORAL_TABLET | Freq: Every day | ORAL | 0 refills | Status: DC

## 2019-05-15 NOTE — Patient Instructions (Addendum)
Smoking Tobacco Information, Adult Smoking tobacco can be harmful to your health. Tobacco contains a poisonous (toxic), colorless chemical called nicotine. Nicotine is addictive. It changes the brain and can make it hard to stop smoking. Tobacco also has other toxic chemicals that can hurt your body and raise your risk of many cancers. How can smoking tobacco affect me? Smoking tobacco puts you at risk for:  Cancer. Smoking is most commonly associated with lung cancer, but can also lead to cancer in other parts of the body.  Chronic obstructive pulmonary disease (COPD). This is a long-term lung condition that makes it hard to breathe. It also gets worse over time.  High blood pressure (hypertension), heart disease, stroke, or heart attack.  Lung infections, such as pneumonia.  Cataracts. This is when the lenses in the eyes become clouded.  Digestive problems. This may include peptic ulcers, heartburn, and gastroesophageal reflux disease (GERD).  Oral health problems, such as gum disease and tooth loss.  Loss of taste and smell. Smoking can affect your appearance by causing:  Wrinkles.  Yellow or stained teeth, fingers, and fingernails. Smoking tobacco can also affect your social life, because:  It may be challenging to find places to smoke when away from home. Many workplaces, restaurants, hotels, and public places are tobacco-free.  Smoking is expensive. This is due to the cost of tobacco and the long-term costs of treating health problems from smoking.  Secondhand smoke may affect those around you. Secondhand smoke can cause lung cancer, breathing problems, and heart disease. Children of smokers have a higher risk for: ? Sudden infant death syndrome (SIDS). ? Ear infections. ? Lung infections. If you currently smoke tobacco, quitting now can help you:  Lead a longer and healthier life.  Look, smell, breathe, and feel better over time.  Save money.  Protect others from the  harms of secondhand smoke. What actions can I take to prevent health problems? Quit smoking   Do not start smoking. Quit if you already do.  Make a plan to quit smoking and commit to it. Look for programs to help you and ask your health care provider for recommendations and ideas.  Set a date and write down all the reasons you want to quit.  Let your friends and family know you are quitting so they can help and support you. Consider finding friends who also want to quit. It can be easier to quit with someone else, so that you can support each other.  Talk with your health care provider about using nicotine replacement medicines to help you quit, such as gum, lozenges, patches, sprays, or pills.  Do not replace cigarette smoking with electronic cigarettes, which are commonly called e-cigarettes. The safety of e-cigarettes is not known, and some may contain harmful chemicals.  If you try to quit but return to smoking, stay positive. It is common to slip up when you first quit, so take it one day at a time.  Be prepared for cravings. When you feel the urge to smoke, chew gum or suck on hard candy. Lifestyle  Stay busy and take care of your body.  Drink enough fluid to keep your urine pale yellow.  Get plenty of exercise and eat a healthy diet. This can help prevent weight gain after quitting.  Monitor your eating habits. Quitting smoking can cause you to have a larger appetite than when you smoke.  Find ways to relax. Go out with friends or family to a movie or a restaurant   where people do not smoke.  Ask your health care provider about having regular tests (screenings) to check for cancer. This may include blood tests, imaging tests, and other tests.  Find ways to manage your stress, such as meditation, yoga, or exercise. Where to find support To get support to quit smoking, consider:  Asking your health care provider for more information and resources.  Taking classes to learn  more about quitting smoking.  Looking for local organizations that offer resources about quitting smoking.  Joining a support group for people who want to quit smoking in your local community.  Calling the smokefree.gov counselor helpline: 1-800-Quit-Now (223)783-4519) Where to find more information You may find more information about quitting smoking from:  HelpGuide.org: www.helpguide.org  https://hall.com/: smokefree.gov  American Lung Association: www.lung.org Contact a health care provider if you:  Have problems breathing.  Notice that your lips, nose, or fingers turn blue.  Have chest pain.  Are coughing up blood.  Feel faint or you pass out.  Have other health changes that cause you to worry. Summary  Smoking tobacco can negatively affect your health, the health of those around you, your finances, and your social life.  Do not start smoking. Quit if you already do. If you need help quitting, ask your health care provider.  Think about joining a support group for people who want to quit smoking in your local community. There are many effective programs that will help you to quit this behavior. This information is not intended to replace advice given to you by your health care provider. Make sure you discuss any questions you have with your health care provider. Document Released: 10/20/2016 Document Revised: 11/24/2017 Document Reviewed: 10/20/2016 Elsevier Patient Education  2020 [place] all medications as directed. Remain well hydrated, follow heart healthy diet. EKG- Normal Continue to reduce to stop tobacco use- YOU CAN DO IT! Please call  Dr. Elspeth Cho and inquire how many days prior to surgery do you need the COVID test, tests are taking 2-7 days to result currently. Social distance and wear a mask when in public. Good luck with the surgery!

## 2019-05-16 ENCOUNTER — Encounter: Payer: BC Managed Care – PPO | Attending: Surgery | Admitting: Dietician

## 2019-05-16 DIAGNOSIS — Z419 Encounter for procedure for purposes other than remedying health state, unspecified: Secondary | ICD-10-CM | POA: Insufficient documentation

## 2019-05-16 DIAGNOSIS — E669 Obesity, unspecified: Secondary | ICD-10-CM | POA: Diagnosis not present

## 2019-05-16 LAB — EKG 12-LEAD

## 2019-05-16 NOTE — Progress Notes (Signed)
Supervised Weight Loss Visit Pre-Operative Sleeve Gastrectomy Surgery  Medical Nutrition Therapy Appt start time: 4:15pm  End time: 4:45pm  Referral Stated SWL Appointments Required: 0 3rd SWL Visit (per RD recommendation)   Proposed Surgery Type: Sleeve Gastrectomy   Pt expectation of surgery: to cure his anxiety and lack of sleeping at night Pt expectation of dietitian: none stated    NUTRITION ASSESSMENT   Anthropometrics  Start weight at [facility]: 349 lbs Today's weight: 345 lbs BMI: 42 kg/m2     Psychosocial/Lifestyle Pt arrives having lost about 4 pounds. Pt states since he has been working he has cut down on smoking due to less breaks but still smoking daily (1/2 pack/day) and now that Chantix dose has been increased pt states it is helping.    Notable Signs/Symptoms Back pain Nocturnal polyuria  Neuropathy Knee pain Anxiety leading to picking his arms causing wounds   24-Hr Dietary Recall: First Meal: yogurt (or bagels + cream cheese + blueberries)  Snack: none  Second Meal: 1/2 a sandwich  Snack: carrots + ranch  Third Meal: stromboli    Snack: none  Beverages: water, 2 or 3 16-ounce bottles mountain dew (continueing to lessen goal of 2 a day)  Pt is at risk for several micronutrient deficiencies such as vitamin C due to his smoking behaviors and lack of healthy diet. Pt belives taking a multivitamin is enough to cover deficiencies.   Pt states he has been eating breakfast and eating more throughout the day but less at once. States he has cut back on how much pizza he eats, but has been eating strombolis instead. Continuing to cut back on soda intake, now only drinks 2 to [place] per day.   Common foods eaten recently include chicken, corn on the cob, green beans, new potatoes, squash casserole, steamed broccoli with [place] dressing.   Physical Activity  ADL's   Estimated Energy Needs Calories: 2200 Carbohydrate: 248g Protein: 165g Fat:  61g   NUTRITION DIAGNOSIS  Overweight/obesity ([place]-3.3) related to past poor dietary habits and physical inactivity as evidenced by patient w/ planned Sleeve Gastrectomy surgery following dietary guidelines for continued weight loss.    NUTRITION INTERVENTION  Nutrition counseling (C-1) and education (E-2) to facilitate bariatric surgery goals.  Goals: -Keep working on limiting the [employer] great job!! A max of 2 a day. -Continue to cut back on smoking and quit completely.   -Avoid eating and drinking at the same time.   Change readiness: Contemplative  Demonstrated degree of understanding via: Teach Back     MONITORING & EVALUATION Dietary intake, weekly physical activity, body weight, and pre-op goals in about 1 month.    Next Steps  Continued appointments to help pt make lifestyle changes until better prepared for surgery, especially cessation of smoking. Pt is to return to [facility] for another SWL appointment next month.

## 2019-05-16 NOTE — Assessment & Plan Note (Signed)
Surgical clearance for R great toe surgery-with Dr. Elspeth Cho Continue all medications as directed. Remain well hydrated, follow heart healthy diet. EKG- Normal Continue to reduce to stop tobacco use- YOU CAN DO IT! Please call  Dr. Elspeth Cho and inquire how many days prior to surgery do you need the COVID test, tests are taking 2-7 days to result currently. Social distance and wear a mask when in public. Good luck with the surgery!

## 2019-05-17 NOTE — Progress Notes (Signed)
@Patient  ID: Eduardo [name], male    DOB: 06/27/1971, 48 y.o.   MRN: [id number]  Chief Complaint  Patient presents with  . Follow-up    not using a cpap at this time - no complaints today    Referring provider: [name], NP  HPI:  48 year old male current every day smoker followed in our office for severe obstructive sleep apnea and chronic pain use sinusitis  PMH: Obesity, pansinusitis, back pain, hypertension, depression, neuropathy, anxiety, gout, history of septoplasty, history of nasal endoscopy cleaning Smoker/ Smoking History: Current Smoker.  Currently smoking half pack a day.  33-pack-year smoking history. Maintenance: None Pt of: Dr. Elsworth Soho   05/18/2019  - Visit   48 year old male current every day smoker presenting to our office today for 80-month follow-up.  Patient followed by our office for severe obstructive sleep apnea.  Patient was tried on CPAP therapy with patient could not tolerate CPAP pressures.  In January/2020 was requested the patient complete a CPAP titration.  Patient has not completed or been scheduled for this.  Patient reports that he did not tolerate CPAP therapy.  Patient reports that he is worked with primary care and has been established with [facility] [place] [facility] for preparing to have bariatric surgery.  Patient is still a current every day smoker smoking half a pack per day.  Patient is currently on Chantix.  He is interested in stopping smoking but he has not set a quit date yet.     Tests:   NPSG 11/2017  severe OSA with AHI of 83/h and was desaturation of 87%  Spirometry 04/2018  ratio of 83, and with FVC of 71% suggestive of mild restriction   FENO:  No results found for: NITRICOXIDE  PFT: No flowsheet data found.  Imaging: No results found.    Specialty Problems      Pulmonary Problems   Acute maxillary sinusitis   Deviated septum   Chronic pansinusitis   Hypertrophy, nasal, turbinate   OSA (obstructive sleep  apnea)    Severe, AHI 83/h      Shortness of breath      Allergies  Allergen Reactions  . Pollen Extract Cough  . Dust Mite Extract Other (See Comments)  . Other Other (See Comments)    Ragweed Ragweed    Immunization History  Administered Date(s) Administered  . Tdap 07/30/2017    Past Medical History:  Diagnosis Date  . Anxiety   . Chronic back pain   . Degenerative disk disease   . Depression   . DVT of upper extremity (deep vein thrombosis) ([place])   . GERD (gastroesophageal reflux disease)   . Hypertension   . MRSA (methicillin resistant staph aureus) culture positive   . Peripheral neuropathy   . Pilonidal cyst   . Pulmonary embolus ([place])    no blood thinner 2002  . Sleep apnea    can't use cpap due to deviated nasal septumg    Tobacco History: Social History   Tobacco Use  Smoking Status Current Every Day Smoker  . Packs/day: 1.00  . Years: 33.00  . Pack years: 33.00  . Types: Cigarettes  Smokeless Tobacco Never Used  Tobacco Comment   05/18/19 cigarettes 1/2 PPD   Ready to quit: Yes Counseling given: Yes Comment: 05/18/19 cigarettes 1/2 PPD  Smoking assessment and cessation counseling  Patient currently smoking: 0.5 ppd  I have advised the patient to quit/stop smoking as soon as possible due to high risk for multiple  medical problems.  It will also be very difficult for [place] to manage patient's  respiratory symptoms and status if we continue to expose her lungs to a known irritant.  We do not advise e-cigarettes as a form of stopping smoking.  Patient is willing to quit smoking. Needs to set quit date.   I have advised the patient that we can assist and have options of nicotine replacement therapy, provided smoking cessation education today, provided smoking cessation counseling, and provided cessation resources. Currently using chantix.   Follow-up next office visit office visit for assessment of smoking cessation.    Smoking cessation  counseling advised for: 4 min     Outpatient Encounter Medications as of 05/18/2019  Medication Sig  . albuterol (PROVENTIL HFA;VENTOLIN HFA) 108 (90 Base) MCG/ACT inhaler Inhale 2 puffs into the lungs every 6 (six) hours as needed for wheezing or shortness of breath.  [name] amLODipine (NORVASC) 5 MG tablet Take 1 tablet (5 mg total) by mouth daily.  [name] doxycycline (VIBRA-TABS) 100 MG tablet Take 1 tablet (100 mg total) by mouth 2 (two) times daily.  . fluticasone (FLONASE) 50 MCG/ACT nasal spray SPRAY 2 SPRAYS IN EACH NOSTRIL DAILY  . montelukast (SINGULAIR) 10 MG tablet Take 1 tablet (10 mg total) by mouth at bedtime.  . Nutritional Supplements (JUICE PLUS FIBRE PO) Take 4 each by mouth daily with lunch.  . oxyCODONE-acetaminophen (PERCOCET) 7.5-325 MG tablet Take 1 tablet by mouth every 6 (six) hours as needed.  . pregabalin (LYRICA) 300 MG capsule Take 1 capsule (300 mg total) by mouth 2 (two) times daily.  . varenicline (CHANTIX) 1 MG tablet Take 1 mg by mouth 2 (two) times daily.   No facility-administered encounter medications on file as of 05/18/2019.      Review of Systems  Review of Systems  Constitutional: Positive for fatigue. Negative for activity change, chills, fever and unexpected weight change.  HENT: Negative for postnasal drip, rhinorrhea, sinus pressure, sinus pain and sore throat.   Eyes: Negative.   Respiratory: Negative for cough, shortness of breath and wheezing.   Cardiovascular: Negative for chest pain and palpitations.  Gastrointestinal: Negative for constipation, diarrhea, nausea and vomiting.  Endocrine: Negative.   Genitourinary: Negative.   Musculoskeletal: Negative.   Skin: Negative.   Neurological: Negative for dizziness and headaches.  Psychiatric/Behavioral: Negative.  Negative for dysphoric mood. The patient is not nervous/anxious.   All other systems reviewed and are negative.    Physical Exam  BP (!) 142/80 (BP Location: Left Arm, Patient  Position: Sitting, Cuff Size: Normal)   Pulse 74   Temp 98.2 F (36.8 C)   Ht 6\' 4"  (1.93 m)   Wt (!) 346 lb 3.2 oz (157 kg)   SpO2 96%   BMI 42.14 kg/m   Wt Readings from Last 5 Encounters:  05/18/19 (!) 346 lb 3.2 oz (157 kg)  05/16/19 (!) 345 lb 14.4 oz (156.9 kg)  05/15/19 (!) 349 lb 6.4 oz (158.5 kg)  04/25/19 (!) 348 lb (157.9 kg)  04/11/19 (!) 350 lb 3.2 oz (158.8 kg)     Physical Exam Vitals signs and nursing note reviewed.  Constitutional:      General: He is not in acute distress.    Appearance: Normal appearance. He is obese.     Comments: Obese adult male  HENT:     Head: Normocephalic and atraumatic.     Right Ear: Hearing, tympanic membrane, ear canal and external ear normal.  Left Ear: Hearing, tympanic membrane, ear canal and external ear normal.     Nose: Nose normal. No mucosal edema, congestion or rhinorrhea.     Right Turbinates: Not enlarged.     Left Turbinates: Not enlarged.     Mouth/Throat:     Mouth: Mucous membranes are dry.     Pharynx: Oropharynx is clear. No oropharyngeal exudate.     Comments: Mallampati 4 Eyes:     Pupils: Pupils are equal, round, and reactive to light.  Neck:     Musculoskeletal: Normal range of motion.  Cardiovascular:     Rate and Rhythm: Normal rate and regular rhythm.     Pulses: Normal pulses.     Heart sounds: Normal heart sounds. No murmur.  Pulmonary:     Effort: Pulmonary effort is normal.     Breath sounds: Normal breath sounds. No decreased breath sounds, wheezing or rales.  Musculoskeletal:     Right lower leg: No edema.     Left lower leg: No edema.  Lymphadenopathy:     Cervical: No cervical adenopathy.  Skin:    General: Skin is warm and dry.     Capillary Refill: Capillary refill takes less than 2 seconds.     Findings: No erythema or rash.  Neurological:     General: No focal deficit present.     Mental Status: He is alert and oriented to person, place, and time.     Motor: No weakness.      Coordination: Coordination normal.     Gait: Gait is intact. Gait normal.  Psychiatric:        Mood and Affect: Mood normal.        Behavior: Behavior normal. Behavior is cooperative.        Thought Content: Thought content normal.        Judgment: Judgment normal.      Lab Results:  CBC    Component Value Date/Time   WBC 10.2 09/28/2018 0841   WBC 12.5 (H) 02/03/2018 1124   RBC 5.12 09/28/2018 0841   RBC 4.57 02/03/2018 1124   HGB 15.5 09/28/2018 0841   HCT 45.7 09/28/2018 0841   PLT 219 09/28/2018 0841   MCV 89 09/28/2018 0841   MCH 30.3 09/28/2018 0841   MCH 32.6 02/03/2018 1124   MCHC 33.9 09/28/2018 0841   MCHC 36.0 02/03/2018 1124   RDW 15.4 09/28/2018 0841   LYMPHSABS 2.4 09/28/2018 0841   MONOABS 1.0 04/10/2017 1416   EOSABS 0.3 09/28/2018 0841   BASOSABS 0.1 09/28/2018 0841    BMET    Component Value Date/Time   NA 141 09/28/2018 0841   K 3.4 (L) 09/28/2018 0841   CL 98 09/28/2018 0841   CO2 27 09/28/2018 0841   GLUCOSE 157 (H) 09/28/2018 0841   GLUCOSE 109 (H) 01/31/2018 1554   BUN 17 09/28/2018 0841   CREATININE 1.25 09/28/2018 0841   CREATININE 1.12 10/25/2014 1124   CALCIUM 9.3 09/28/2018 0841   GFRNONAA 67 09/28/2018 0841   GFRNONAA 79 10/25/2014 1124   GFRAA 78 09/28/2018 0841   GFRAA >89 10/25/2014 1124    BNP No results found for: BNP  ProBNP No results found for: PROBNP    Assessment & Plan:   OSA (obstructive sleep apnea) Plan: Reschedule patient for CPAP titration study Proceed forward with bariatric surgery under guidance of [facility] Follow-up with our office in 2 to 3 months after completion of CPAP titration study Aggressively work on increasing daily physical  exercise as well as diet to help with reducing BMI and weight  Morbid obesity ([place]) Plan: Continue to work with [facility] for work-up for bariatric surgery Continue to work on diet and exercise to reduce BMI and help with chronic disease management   TOBACCO ABUSE Plan: Continue Chantix Set quit date within the next 4 weeks Emphasized the need to stop smoking     Return in about 3 months (around 08/18/2019), or if symptoms worsen or fail to improve, for Follow up with Dr. Elsworth Soho, After CPAP Titration .   Lauraine Rinne, NP 05/18/2019   This appointment was 28 minutes long with over 50% of the time in direct face-to-face patient care, assessment, plan of care, and follow-up.

## 2019-05-18 ENCOUNTER — Other Ambulatory Visit: Payer: Self-pay

## 2019-05-18 ENCOUNTER — Ambulatory Visit (INDEPENDENT_AMBULATORY_CARE_PROVIDER_SITE_OTHER): Payer: BC Managed Care – PPO | Admitting: Pulmonary Disease

## 2019-05-18 ENCOUNTER — Encounter: Payer: Self-pay | Admitting: Pulmonary Disease

## 2019-05-18 VITALS — BP 142/80 | HR 74 | Temp 98.2°F | Ht 76.0 in | Wt 346.2 lb

## 2019-05-18 DIAGNOSIS — F17210 Nicotine dependence, cigarettes, uncomplicated: Secondary | ICD-10-CM | POA: Diagnosis not present

## 2019-05-18 DIAGNOSIS — G4733 Obstructive sleep apnea (adult) (pediatric): Secondary | ICD-10-CM | POA: Diagnosis not present

## 2019-05-18 DIAGNOSIS — F17200 Nicotine dependence, unspecified, uncomplicated: Secondary | ICD-10-CM

## 2019-05-18 NOTE — Assessment & Plan Note (Signed)
Plan: Continue to work with [facility] for work-up for bariatric surgery Continue to work on diet and exercise to reduce BMI and help with chronic disease management

## 2019-05-18 NOTE — Assessment & Plan Note (Signed)
Plan: Continue Chantix Set quit date within the next 4 weeks Emphasized the need to stop smoking

## 2019-05-18 NOTE — Assessment & Plan Note (Signed)
Plan: Reschedule patient for CPAP titration study Proceed forward with bariatric surgery under guidance of [facility] Follow-up with our office in 2 to 3 months after completion of CPAP titration study Aggressively work on increasing daily physical exercise as well as diet to help with reducing BMI and weight

## 2019-05-18 NOTE — Patient Instructions (Addendum)
We will order a CPAP titration to further evaluate your effective sleep apnea and CPAP management  Continue to work with [facility] for bariatric surgery  We recommend that you stop smoking.  >>>You need to set a quit date >>>If you have friends or family who smoke, let them know you are trying to quit and not to smoke around you or in your living environment  Smoking Cessation Resources:  1 800 QUIT NOW  >>> Patient to call this resource and utilize it to help support her quit smoking >>> Keep up your hard work with stopping smoking  You can also contact the [facility] >>>For smoking cessation classes call [phone]  We do not recommend using e-cigarettes as a form of stopping smoking  You can sign up for smoking cessation support texts and information:  >>>https://smokefree.gov/smokefreetxt  Continue Chantix to help support yourself stop smoking, you need to set a quit date  Follow-up with our office after completing the CPAP titration within the next 2 to 3 months  Return in about 3 months (around 08/18/2019), or if symptoms worsen or fail to improve, for Follow up with Dr. Elsworth Soho, After CPAP Titration .  Coronavirus (COVID-19) Are you at risk?  Are you at risk for the Coronavirus (COVID-19)?  To be considered HIGH RISK for Coronavirus (COVID-19), you have to meet the following criteria:  . Traveled to [place], [place], [place], [place] or [place]; or in the [place] to [place], [place], [place], or [place]; and have fever, cough, and shortness of breath within the last 2 weeks of travel OR . Been in close contact with a person diagnosed with COVID-19 within the last 2 weeks and have fever, cough, and shortness of breath . IF YOU DO NOT MEET THESE CRITERIA, YOU ARE CONSIDERED LOW RISK FOR COVID-19.  What to do if you are HIGH RISK for COVID-19?  [name] If you are having a medical emergency, call 911. . Seek medical care right away. Before  you go to a doctor's office, urgent care or emergency department, call ahead and tell them about your recent travel, contact with someone diagnosed with COVID-19, and your symptoms. You should receive instructions from your physician's office regarding next steps of care.  . When you arrive at healthcare provider, tell the healthcare staff immediately you have returned from visiting [place], [place], [place], [place] or [place]; or traveled in the [place] to [place], [place], [place], or [place]; in the last two weeks or you have been in close contact with a person diagnosed with COVID-19 in the last 2 weeks.   . Tell the health care staff about your symptoms: fever, cough and shortness of breath. . After you have been seen by a medical provider, you will be either: o Tested for (COVID-19) and discharged home on quarantine except to seek medical care if symptoms worsen, and asked to  - Stay home and avoid contact with others until you get your results (4-5 days)  - Avoid travel on public transportation if possible (such as bus, train, or airplane) or o Sent to the Emergency Department by EMS for evaluation, COVID-19 testing, and possible admission depending on your condition and test results.  What to do if you are LOW RISK for COVID-19?  Reduce your risk of any infection by using the same precautions used for avoiding the common cold or flu:  [name] Wash your hands often with soap and warm water for  at least 20 seconds.  If soap and water are not readily available, use an alcohol-based hand sanitizer with at least 60% alcohol.  . If coughing or sneezing, cover your mouth and nose by coughing or sneezing into the elbow areas of your shirt or coat, into a tissue or into your sleeve (not your hands). . Avoid shaking hands with others and consider head nods or verbal greetings only. . Avoid touching your eyes, nose, or mouth with unwashed hands.  . Avoid close contact with people who are  sick. . Avoid places or events with large numbers of people in one location, like concerts or sporting events. . Carefully consider travel plans you have or are making. . If you are planning any travel outside or inside the [place], visit the CDC's Travelers' Health webpage for the latest health notices. . If you have some symptoms but not all symptoms, continue to monitor at home and seek medical attention if your symptoms worsen. . If you are having a medical emergency, call 911.   [place] / e-Visit: [email or web address]         [facility]: [facility]: [phone]                   [facility]: [phone]           It is flu season:   >>> Best ways to protect herself from the flu: Receive the yearly flu vaccine, practice good hand hygiene washing with soap and also using hand sanitizer when available, eat a nutritious meals, get adequate rest, hydrate appropriately   Please contact the office if your symptoms worsen or you have concerns that you are not improving.   Thank you for choosing [facility] for your healthcare, and for allowing [place] to partner with you on your healthcare journey. I am thankful to be able to provide care to you today.   Wyn Quaker FNP-C

## 2019-05-19 ENCOUNTER — Other Ambulatory Visit: Payer: Self-pay | Admitting: Podiatry

## 2019-05-19 DIAGNOSIS — E1142 Type 2 diabetes mellitus with diabetic polyneuropathy: Secondary | ICD-10-CM

## 2019-05-22 ENCOUNTER — Encounter: Payer: Self-pay | Admitting: Registered Nurse

## 2019-05-22 ENCOUNTER — Encounter: Payer: BC Managed Care – PPO | Attending: Physical Medicine & Rehabilitation | Admitting: Registered Nurse

## 2019-05-22 ENCOUNTER — Other Ambulatory Visit: Payer: Self-pay

## 2019-05-22 VITALS — BP 131/83 | HR 87 | Temp 97.5°F | Resp 18 | Ht 76.0 in | Wt 349.4 lb

## 2019-05-22 DIAGNOSIS — M79672 Pain in left foot: Secondary | ICD-10-CM | POA: Diagnosis not present

## 2019-05-22 DIAGNOSIS — M5136 Other intervertebral disc degeneration, lumbar region: Secondary | ICD-10-CM

## 2019-05-22 DIAGNOSIS — M21612 Bunion of left foot: Secondary | ICD-10-CM | POA: Insufficient documentation

## 2019-05-22 DIAGNOSIS — G8929 Other chronic pain: Secondary | ICD-10-CM

## 2019-05-22 DIAGNOSIS — M79671 Pain in right foot: Secondary | ICD-10-CM | POA: Diagnosis not present

## 2019-05-22 DIAGNOSIS — M25572 Pain in left ankle and joints of left foot: Secondary | ICD-10-CM

## 2019-05-22 DIAGNOSIS — G609 Hereditary and idiopathic neuropathy, unspecified: Secondary | ICD-10-CM

## 2019-05-22 DIAGNOSIS — G894 Chronic pain syndrome: Secondary | ICD-10-CM | POA: Insufficient documentation

## 2019-05-22 DIAGNOSIS — M5416 Radiculopathy, lumbar region: Secondary | ICD-10-CM

## 2019-05-22 DIAGNOSIS — M21611 Bunion of right foot: Secondary | ICD-10-CM | POA: Diagnosis not present

## 2019-05-22 DIAGNOSIS — Z79899 Other long term (current) drug therapy: Secondary | ICD-10-CM

## 2019-05-22 DIAGNOSIS — G608 Other hereditary and idiopathic neuropathies: Secondary | ICD-10-CM | POA: Diagnosis not present

## 2019-05-22 DIAGNOSIS — Z5181 Encounter for therapeutic drug level monitoring: Secondary | ICD-10-CM

## 2019-05-22 DIAGNOSIS — M25571 Pain in right ankle and joints of right foot: Secondary | ICD-10-CM

## 2019-05-22 MED ORDER — OXYCODONE-ACETAMINOPHEN 7.5-325 MG PO TABS: 1.0000 | ORAL_TABLET | Freq: Four times a day (QID) | ORAL | 0 refills | Status: DC | PRN

## 2019-05-22 NOTE — Progress Notes (Signed)
Subjective:    Patient ID: Eduardo [name], male    DOB: 1971-09-29, 48 y.o.   MRN: [id number]  HPI: Eduardo [name] is a 48 y.o. male who returns for follow up appointment for chronic pain and medication refill. He states his pain is located in his lower back radiating into his left lower extremity, left knee pain and bilateral ankle pain. Also states he's only receiving 4 hour of relief of pain with his current medication regimen. We discussed other medication regimens. He states he was prescribed morphine in the past and he didn't receive any relief of his pain. 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 Will discuss with Dr. Letta Pate, he verbalizes understanding. He rates his pain 8. His current exercise regime is walking.  Eduardo [name] Morphine equivalent is 45.00  MME.  Last UDS  Was perdormed on 11/08/2018, it was consistent.   Pain Inventory Average Pain 9 Pain Right Now 8 My pain is sharp, burning, dull, stabbing and tingling  In the last 24 hours, has pain interfered with the following? General activity 9 Relation with others 8 Enjoyment of life 10 What TIME of day is your pain at its worst? all Sleep (in general) Poor  Pain is worse with: walking, bending, sitting and standing Pain improves with: rest and medication Relief from Meds: 8  Mobility walk without assistance how many minutes can you walk? 5 ability to climb steps?  yes do you drive?  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  no  Physicians involved in your care Any changes since last visit?  no   Family History  Problem Relation Age of Onset   Cancer Maternal Grandmother        breast, ovarian & colon   Hypertension Mother    Diabetes Mother    Hypertension Father     Heart disease Father    Stroke Paternal Grandfather    Neuropathy Neg Hx    Social History   Socioeconomic History   Marital status: Divorced    Spouse name: Not on file   Number of children: 2   Years of education: 12th    Highest education level: Not on file  Occupational History   Occupation: [name]  Social Designer, fashion/clothing strain: Not on file   Food insecurity    Worry: Not on file    Inability: Not on file   Transportation needs    Medical: Not on file    Non-medical: Not on file  Tobacco Use   Smoking status: Current Every Day Smoker    Packs/day: 1.00    Years: 33.00    Pack years: 33.00    Types: Cigarettes   Smokeless tobacco: Never Used   Tobacco comment: 05/18/19 cigarettes 1/2 PPD  Substance and Sexual Activity   Alcohol use: No    Alcohol/week: 0.0 standard drinks    Comment: Rarely   Drug use: No   Sexual activity: Yes    Birth control/protection: None  Lifestyle   Physical activity    Days per week: Not on file    Minutes per session: Not on file   Stress: Not on file  Relationships   Social connections    Talks on phone: Not on file    Gets together: Not on file  Attends religious service: Not on file    Active member of club or organization: Not on file    Attends meetings of clubs or organizations: Not on file    Relationship status: Not on file  Other Topics Concern   Not on file  Social History Narrative   Lives at home w/ his step-mother   Right-handed   Drinks about 2-3 [place] a day   Past Surgical History:  Procedure Laterality Date   Irrigation and debridement of back abscess     NASAL SEPTOPLASTY W/ TURBINOPLASTY [name] 02/03/2018   Procedure: NASAL SEPTOPLASTY WITH TURBINATE REDUCTION;  Surgeon: [name], MD;  Location: [facility];  Service: ENT;  Laterality: [name];   PILONIDAL CYST DRAINAGE [name] 04/10/2017   Procedure: IRRIGATION AND DEBRIDEMENT BACK ABSCESS;  Surgeon: [name], MD;  Location: [facility];  Service: General;  Laterality: [name];   SINUS ENDO WITH FUSION [name] 02/03/2018   Procedure: ENDOSCOPIC SINUS SURGERY WITH FUSION;  Surgeon: [name], MD;  Location: [facility];  Service: ENT;  Laterality: [name];   THORACIC OUTLET SURGERY  2000   WRIST FUSION  2002   Past Medical History:  Diagnosis Date   Anxiety    Chronic back pain    Degenerative disk disease    Depression    DVT of upper extremity (deep vein thrombosis) (HCC)    GERD (gastroesophageal reflux disease)    Hypertension    MRSA (methicillin resistant staph aureus) culture positive    Peripheral neuropathy    Pilonidal cyst    Pulmonary embolus ([place])    no blood thinner 2002   Sleep apnea    can't use cpap due to deviated nasal septumg   There were no vitals taken for this visit.  Opioid Risk Score:   Fall Risk Score:  `1  Depression screen PHQ 2/9  Depression screen [facility] 2/9 05/15/2019 04/25/2019 03/29/2019 03/28/2019 03/07/2019 03/02/2019 02/16/2019  Decreased Interest 1 0 0 0 2 0 0  Down, Depressed, Hopeless 1 0 0 0 1 0 0  PHQ - 2 Score 2 0 0 0 3 0 0  Altered sleeping 3 - - - 3 - -  Tired, decreased energy 2 - - - 3 - -  Change in appetite 1 - - - 2 - -  Feeling bad or failure about yourself  0 - - - 0 - -  Trouble concentrating 2 - - - 1 - -  Moving slowly or fidgety/restless 0 - - - - - -  Suicidal thoughts 0 - - - 0 - -  PHQ-9 Score 10 - - - 12 - -  Difficult doing work/chores Somewhat difficult - - - Very difficult - -  Some recent data might be hidden     Review of Systems  Constitutional: Positive for fatigue and unexpected weight change.  HENT: Negative.   Eyes: Negative.   Respiratory: Positive for apnea and shortness of breath.   Cardiovascular: Positive for leg swelling.  Gastrointestinal: Negative.   Endocrine: Negative.   Genitourinary: Negative.   Musculoskeletal: Negative.   Skin: Negative.   Allergic/Immunologic: Negative.   Neurological: Negative.   Hematological:  Negative.   Psychiatric/Behavioral: Positive for dysphoric mood. The patient is nervous/anxious.   All other systems reviewed and are negative.      Objective:   Physical Exam Vitals signs and nursing note reviewed.  Constitutional:      Appearance: Normal appearance.  Neck:     Musculoskeletal: Normal range  of motion and neck supple.  Cardiovascular:     Rate and Rhythm: Normal rate and regular rhythm.     Pulses: Normal pulses.     Heart sounds: Normal heart sounds.  Pulmonary:     Effort: Pulmonary effort is normal.     Breath sounds: Normal breath sounds.  Musculoskeletal:     Comments: Normal Muscle Bulk and Muscle Testing Reveals:  Upper Extremities:Full  ROM and Muscle Strength 5/5 Lumbar Paraspinal Tenderness: L-3-L-5 Lower Extremities: Full ROM and Muscle Strength 5/5 Arises from Table slowly Antalgic Gait   Skin:    General: Skin is warm and dry.  Neurological:     Mental Status: He is alert and oriented to person, place, and time.  Psychiatric:        Mood and Affect: Mood normal.        Behavior: Behavior normal.           Assessment & Plan:  . Chronic idiopathic axonal polyneuropathy: Continue [place].05/25/2019. 2. Posterior tibial tendinitis of left lower extremity: Continue HEP as Tolerated. Continue to Monitor.05/25/2019 3. Lumbar DDD/ Lumbar Radiculitis: Continue current medication regime and HEP as Tolerated.05/25/2019 4. Chronic Pain Syndrome:Refilled: Oxycodone7.5/325mg  one tablet4 times daily as needed for pain. PerDr, Kirsteins note he'sunwilling to go any higher given his history in terms of total morphine equivalent dose.05/25/2019 We will continue the opioid monitoring program, this consists of regular clinic visits, examinations, urine drug screen, pill counts as well as use of [place] Controlled Substance Reporting system.  F/U in1 month

## 2019-05-25 ENCOUNTER — Encounter: Payer: BC Managed Care – PPO | Admitting: Registered Nurse

## 2019-06-01 ENCOUNTER — Ambulatory Visit: Payer: BC Managed Care – PPO | Admitting: Podiatry

## 2019-06-01 ENCOUNTER — Other Ambulatory Visit: Payer: Self-pay

## 2019-06-01 DIAGNOSIS — Z6841 Body Mass Index (BMI) 40.0 and over, adult: Secondary | ICD-10-CM | POA: Diagnosis not present

## 2019-06-01 DIAGNOSIS — G4733 Obstructive sleep apnea (adult) (pediatric): Secondary | ICD-10-CM | POA: Diagnosis not present

## 2019-06-01 DIAGNOSIS — M19079 Primary osteoarthritis, unspecified ankle and foot: Secondary | ICD-10-CM

## 2019-06-01 DIAGNOSIS — L97511 Non-pressure chronic ulcer of other part of right foot limited to breakdown of skin: Secondary | ICD-10-CM

## 2019-06-01 DIAGNOSIS — G609 Hereditary and idiopathic neuropathy, unspecified: Secondary | ICD-10-CM

## 2019-06-01 NOTE — Progress Notes (Signed)
Subjective:  Patient ID: Eduardo [name], male    DOB: [birth year],  MRN: [id number]  Chief Complaint  Patient presents with  . Wound Check    Surgical consult right foot 1st digit wound. Pt states no new concerns.    48 y.o. male presents with the above complaint. Had PCP sign form for surgery. Not wearing the boot he was given as he cant wear it at work. Denies new concerns.  Review of Systems: Negative except as noted in the HPI. Denies N/V/F/Ch.  Past Medical History:  Diagnosis Date  . Anxiety   . Chronic back pain   . Degenerative disk disease   . Depression   . DVT of upper extremity (deep vein thrombosis) ([place])   . GERD (gastroesophageal reflux disease)   . Hypertension   . MRSA (methicillin resistant staph aureus) culture positive   . Peripheral neuropathy   . Pilonidal cyst   . Pulmonary embolus ([place])    no blood thinner 2002  . Sleep apnea    can't use cpap due to deviated nasal septumg    Current Outpatient Medications:  .  albuterol (PROVENTIL HFA;VENTOLIN HFA) 108 (90 Base) MCG/ACT inhaler, Inhale 2 puffs into the lungs every 6 (six) hours as needed for wheezing or shortness of breath., Disp: 1 Inhaler, Rfl: 2 .  amLODipine (NORVASC) 5 MG tablet, Take 1 tablet (5 mg total) by mouth daily., Disp: 90 tablet, Rfl: 0 .  doxycycline (VIBRA-TABS) 100 MG tablet, Take 1 tablet (100 mg total) by mouth 2 (two) times daily., Disp: 60 tablet, Rfl: 2 .  fluticasone (FLONASE) 50 MCG/ACT nasal spray, SPRAY 2 SPRAYS IN EACH NOSTRIL DAILY, Disp: 16 g, Rfl: 6 .  montelukast (SINGULAIR) 10 MG tablet, Take 1 tablet (10 mg total) by mouth at bedtime., Disp: 90 tablet, Rfl: 3 .  Nutritional Supplements (JUICE PLUS FIBRE PO), Take 4 each by mouth daily with lunch., Disp: , Rfl:  .  oxyCODONE-acetaminophen (PERCOCET) 7.5-325 MG tablet, Take 1 tablet by mouth every 6 (six) hours as needed., Disp: 120 tablet, Rfl: 0 .  pregabalin (LYRICA) 300 MG capsule, Take 1 capsule (300 mg total) by  mouth 2 (two) times daily., Disp: 60 capsule, Rfl: 2 .  varenicline (CHANTIX) 1 MG tablet, Take 1 mg by mouth 2 (two) times daily., Disp: , Rfl:   Social History   Tobacco Use  Smoking Status Current Every Day Smoker  . Packs/day: 1.00  . Years: 33.00  . Pack years: 33.00  . Types: Cigarettes  Smokeless Tobacco Never Used  Tobacco Comment   05/18/19 cigarettes 1/2 PPD    Allergies  Allergen Reactions  . Pollen Extract Cough  . Dust Mite Extract Other (See Comments)  . Other Other (See Comments)    Ragweed Ragweed   Objective:   There were no vitals filed for this visit. There is no height or weight on file to calculate BMI. Constitutional Well developed. Well nourished.  Vascular Dorsalis pedis pulses palpable bilaterally. Posterior tibial pulses palpable bilaterally. Capillary refill normal to all digits.  No cyanosis or clubbing noted. Pedal hair growth normal.  Neurologic Normal speech. Oriented to person, place, and time. Epicritic sensation to light touch grossly present bilaterally.  Dermatologic Nails well groomed and normal in appearance.  Wound R Hallux 1x1 pre-debridement, 1.2x1.2 post. Wound bed granular, no warmth erythema signs of acute infection. No probe to bone.  Orthopedic: Decreased interphalangeal ROM R Hallux   Radiographs: None today. Assessment:   1. Chronic  ulcer of great toe of right foot, limited to breakdown of skin ([place])   2. Arthritis of big toe   3. Body mass index 40.0-44.9, adult ([place])   4. OSA (obstructive sleep apnea)   5. Chronic idiopathic axonal polyneuropathy    Plan:  Patient was evaluated and treated and all questions answered.  Right Hallux Wound, Hallux IPJ Arthritis -Wound debrided as below -Dressed with abx ointment and DSD -Advised to wear boot as much as possible to decrease pressure on the toe. -Planned procedures: Right great toe arthroplasty, excision of ulcer, wound closure and likely adjacent tissue transfer    -Plan for surgery in the coming weeks.  Procedure: Excisional Debridement of Wound Rationale: Removal of non-viable soft tissue from the wound to promote healing.  Anesthesia: none Pre-Debridement Wound Measurements: 1 cm x 1 cm x 0.2 cm  Post-Debridement Wound Measurements: 1.2 cm x 1.2 cm x 0.2 cm  Type of Debridement: Sharp Excisional Tissue Removed: Non-viable soft tissue Depth of Debridement: subcutaneous tissue. Technique: Sharp excisional debridement to bleeding, viable wound base.  Dressing: Dry, sterile, compression dressing. Disposition: Patient tolerated procedure well. Patient to return in 1 week for follow-up.     No follow-ups on file.

## 2019-06-01 NOTE — Progress Notes (Signed)
Subjective:  Patient ID: Eduardo [name], male    DOB: [birth year],  MRN: [id number]  Chief Complaint  Patient presents with  . Foot Pain    pt has polyneuropathy in both of his feet, he has been dealing with neuropathy and the ulcer for about 2 years now, pt states that both of his pain is minimal, he has tried neosporin wrapping it, as well as silvadene cream    48 y.o. male presents with the above complaint. Hx as above.   Review of Systems: Negative except as noted in the HPI. Denies N/V/F/Ch.  Past Medical History:  Diagnosis Date  . Anxiety   . Chronic back pain   . Degenerative disk disease   . Depression   . DVT of upper extremity (deep vein thrombosis) ([place])   . GERD (gastroesophageal reflux disease)   . Hypertension   . MRSA (methicillin resistant staph aureus) culture positive   . Peripheral neuropathy   . Pilonidal cyst   . Pulmonary embolus ([place])    no blood thinner 2002  . Sleep apnea    can't use cpap due to deviated nasal septumg    Current Outpatient Medications:  .  albuterol (PROVENTIL HFA;VENTOLIN HFA) 108 (90 Base) MCG/ACT inhaler, Inhale 2 puffs into the lungs every 6 (six) hours as needed for wheezing or shortness of breath., Disp: 1 Inhaler, Rfl: 2 .  doxycycline (VIBRA-TABS) 100 MG tablet, Take 1 tablet (100 mg total) by mouth 2 (two) times daily., Disp: 60 tablet, Rfl: 2 .  fluticasone (FLONASE) 50 MCG/ACT nasal spray, SPRAY 2 SPRAYS IN EACH NOSTRIL DAILY, Disp: 16 g, Rfl: 6 .  montelukast (SINGULAIR) 10 MG tablet, Take 1 tablet (10 mg total) by mouth at bedtime., Disp: 90 tablet, Rfl: 3 .  Nutritional Supplements (JUICE PLUS FIBRE PO), Take 4 each by mouth daily with lunch., Disp: , Rfl:  .  pregabalin (LYRICA) 300 MG capsule, Take 1 capsule (300 mg total) by mouth 2 (two) times daily., Disp: 60 capsule, Rfl: 2 .  amLODipine (NORVASC) 5 MG tablet, Take 1 tablet (5 mg total) by mouth daily., Disp: 90 tablet, Rfl: 0 .  oxyCODONE-acetaminophen  (PERCOCET) 7.5-325 MG tablet, Take 1 tablet by mouth every 6 (six) hours as needed., Disp: 120 tablet, Rfl: 0 .  varenicline (CHANTIX) 1 MG tablet, Take 1 mg by mouth 2 (two) times daily., Disp: , Rfl:   Social History   Tobacco Use  Smoking Status Current Every Day Smoker  . Packs/day: 1.00  . Years: 33.00  . Pack years: 33.00  . Types: Cigarettes  Smokeless Tobacco Never Used  Tobacco Comment   05/18/19 cigarettes 1/2 PPD    Allergies  Allergen Reactions  . Pollen Extract Cough  . Dust Mite Extract Other (See Comments)  . Other Other (See Comments)    Ragweed Ragweed   Objective:   Vitals:   05/05/19 1020  BP: 120/64  Temp: (!) 97.1 F (36.2 C)   There is no height or weight on file to calculate BMI. Constitutional Well developed. Well nourished.  Vascular Dorsalis pedis pulses palpable bilaterally. Posterior tibial pulses palpable bilaterally. Capillary refill normal to all digits.  No cyanosis or clubbing noted. Pedal hair growth normal.  Neurologic Normal speech. Oriented to person, place, and time. Epicritic sensation to light touch grossly present bilaterally.  Dermatologic Nails well groomed and normal in appearance. R hallux ulcer 1.5x1.5cm post-debridement no warmth erythema signs of acute infection  Orthopedic: Hallux IPJ decreased ROM with  crepitus on attempt. No pain on palpation   Radiographs: Taken and reviewed evidence of prior fracture right hallux proximal phalanx articular portion with resultant joint degenerative changes. No acute fracture. Assessment:   1. Arthritis of big toe   2. Ulcer of great toe, right, limited to breakdown of skin ([place])   3. Chronic idiopathic axonal polyneuropathy   4. Closed displaced fracture of proximal phalanx of right great toe, sequela   5. Pain in toe of right foot    Plan:  Patient was evaluated and treated and all questions answered.  Hallux IPJ arthritis right with ulcer -Educated on etiology, discussed  biomechanical cause of ulceration -Ulcer debrided as below -Dressed with silvadene and DSD -Boot dispensed for offloading of ulcer -Patient has failed all conservative therapy and wishes to proceed with surgical intervention. All risks, benefits, and alternatives discussed with patient. No guarantees given. Consent reviewed and signed by patient. -Planned procedures: Right great toe arthroplasty, excision of ulcer, wound closure and likely adjacent tissue transfer    Procedure: Excisional Debridement of Wound Rationale: Removal of non-viable soft tissue from the wound to promote healing.  Anesthesia: none Pre-Debridement Wound Measurements: 1 cm x 1 cm x 0.2 cm  Post-Debridement Wound Measurements: 1.5 cm x 1.5 cm x 0.2 cm  Type of Debridement: Sharp Excisional Tissue Removed: Non-viable soft tissue Depth of Debridement: subcutaneous tissue. Technique: Sharp excisional debridement to bleeding, viable wound base.  Dressing: Dry, sterile, compression dressing. Disposition: Patient tolerated procedure well. Patient to return in 1 week for follow-up.     No follow-ups on file.

## 2019-06-01 NOTE — H&P (View-Only) (Signed)
Subjective:  Patient ID: Eduardo [name], male    DOB: January 23, 1971,  MRN: [id number]  Chief Complaint  Patient presents with  . Wound Check    Surgical consult right foot 1st digit wound. Pt states no new concerns.    48 y.o. male presents with the above complaint. Had PCP sign form for surgery. Not wearing the boot he was given as he cant wear it at work. Denies new concerns.  Review of Systems: Negative except as noted in the HPI. Denies N/V/F/Ch.  Past Medical History:  Diagnosis Date  . Anxiety   . Chronic back pain   . Degenerative disk disease   . Depression   . DVT of upper extremity (deep vein thrombosis) ([place])   . GERD (gastroesophageal reflux disease)   . Hypertension   . MRSA (methicillin resistant staph aureus) culture positive   . Peripheral neuropathy   . Pilonidal cyst   . Pulmonary embolus ([place])    no blood thinner 2002  . Sleep apnea    can't use cpap due to deviated nasal septumg    Current Outpatient Medications:  .  albuterol (PROVENTIL HFA;VENTOLIN HFA) 108 (90 Base) MCG/ACT inhaler, Inhale 2 puffs into the lungs every 6 (six) hours as needed for wheezing or shortness of breath., Disp: 1 Inhaler, Rfl: 2 .  amLODipine (NORVASC) 5 MG tablet, Take 1 tablet (5 mg total) by mouth daily., Disp: 90 tablet, Rfl: 0 .  doxycycline (VIBRA-TABS) 100 MG tablet, Take 1 tablet (100 mg total) by mouth 2 (two) times daily., Disp: 60 tablet, Rfl: 2 .  fluticasone (FLONASE) 50 MCG/ACT nasal spray, SPRAY 2 SPRAYS IN EACH NOSTRIL DAILY, Disp: 16 g, Rfl: 6 .  montelukast (SINGULAIR) 10 MG tablet, Take 1 tablet (10 mg total) by mouth at bedtime., Disp: 90 tablet, Rfl: 3 .  Nutritional Supplements (JUICE PLUS FIBRE PO), Take 4 each by mouth daily with lunch., Disp: , Rfl:  .  oxyCODONE-acetaminophen (PERCOCET) 7.5-325 MG tablet, Take 1 tablet by mouth every 6 (six) hours as needed., Disp: 120 tablet, Rfl: 0 .  pregabalin (LYRICA) 300 MG capsule, Take 1 capsule (300 mg total) by  mouth 2 (two) times daily., Disp: 60 capsule, Rfl: 2 .  varenicline (CHANTIX) 1 MG tablet, Take 1 mg by mouth 2 (two) times daily., Disp: , Rfl:   Social History   Tobacco Use  Smoking Status Current Every Day Smoker  . Packs/day: 1.00  . Years: 33.00  . Pack years: 33.00  . Types: Cigarettes  Smokeless Tobacco Never Used  Tobacco Comment   05/18/19 cigarettes 1/2 PPD    Allergies  Allergen Reactions  . Pollen Extract Cough  . Dust Mite Extract Other (See Comments)  . Other Other (See Comments)    Ragweed Ragweed   Objective:   There were no vitals filed for this visit. There is no height or weight on file to calculate BMI. Constitutional Well developed. Well nourished.  Vascular Dorsalis pedis pulses palpable bilaterally. Posterior tibial pulses palpable bilaterally. Capillary refill normal to all digits.  No cyanosis or clubbing noted. Pedal hair growth normal.  Neurologic Normal speech. Oriented to person, place, and time. Epicritic sensation to light touch grossly present bilaterally.  Dermatologic Nails well groomed and normal in appearance.  Wound R Hallux 1x1 pre-debridement, 1.2x1.2 post. Wound bed granular, no warmth erythema signs of acute infection. No probe to bone.  Orthopedic: Decreased interphalangeal ROM R Hallux   Radiographs: None today. Assessment:   1. 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 Arthritis of big toe   3. Body mass index 40.0-44.9, adult ([place])   4. OSA (obstructive sleep apnea)   5. Chronic idiopathic axonal polyneuropathy    Plan:  Patient was evaluated and treated and all questions answered.  Right Hallux Wound, Hallux IPJ Arthritis -Wound debrided as below -Dressed with abx ointment and DSD -Advised to wear boot as much as possible to decrease pressure on the toe. -Planned procedures: Right great toe arthroplasty, excision of ulcer, wound closure and likely adjacent tissue transfer    -Plan for surgery in the coming weeks.  Procedure: Excisional Debridement of Wound Rationale: Removal of non-viable soft tissue from the wound to promote healing.  Anesthesia: none Pre-Debridement Wound Measurements: 1 cm x 1 cm x 0.2 cm  Post-Debridement Wound Measurements: 1.2 cm x 1.2 cm x 0.2 cm  Type of Debridement: Sharp Excisional Tissue Removed: Non-viable soft tissue Depth of Debridement: subcutaneous tissue. Technique: Sharp excisional debridement to bleeding, viable wound base.  Dressing: Dry, sterile, compression dressing. Disposition: Patient tolerated procedure well. Patient to return in 1 week for follow-up.     No follow-ups on file.

## 2019-06-02 ENCOUNTER — Telehealth: Payer: Self-pay | Admitting: *Deleted

## 2019-06-02 DIAGNOSIS — Z01812 Encounter for preprocedural laboratory examination: Secondary | ICD-10-CM

## 2019-06-02 NOTE — Telephone Encounter (Signed)
I called and left the patient a message that I have him scheduled for surgery on 06/14/2019.  I informed him that he will receive a call from someone in pre-testing in regards to scheduling an appointment for his Covid test.  I asked him to call if he has any questions or concerns.  I informed him that the surgery will be at [facility] instead of the day surgery center.  He cannot have his surgery at the day surgery center because he has sleep apnea.  I faxed his history and physical form to [place] fax machine.

## 2019-06-06 ENCOUNTER — Telehealth: Payer: Self-pay | Admitting: *Deleted

## 2019-06-06 NOTE — Telephone Encounter (Signed)
DOS 06/14/2019 HAMMER TOE REPAIR 1ST - 12258, EXCISION BENIGN LESION 1.0 CM 1ST - 11421, AND ADJACENT TISSUE TRANSFER - 14040 OF THE RIGHT FOOT  BCBS: Effective Date - 10/19/2018 - 10/18/9998   In-Network    Max Per Benefit Period Year-to-Date Remaining  CoInsurance  20%   Deductible  $1250.00 $0.00  Out-Of-Pocket  $4890.00 $2300.62   AMBULATORY SURGERY  In Network  Copay Coinsurance  Not Applicable  34% per [place]

## 2019-06-07 ENCOUNTER — Other Ambulatory Visit: Payer: Self-pay

## 2019-06-07 ENCOUNTER — Encounter: Payer: BC Managed Care – PPO | Attending: Surgery | Admitting: Skilled Nursing Facility1

## 2019-06-07 DIAGNOSIS — E669 Obesity, unspecified: Secondary | ICD-10-CM | POA: Diagnosis present

## 2019-06-07 DIAGNOSIS — M79676 Pain in unspecified toe(s): Secondary | ICD-10-CM

## 2019-06-07 NOTE — Progress Notes (Signed)
Supervised Weight Loss Visit Pre-Operative Sleeve Gastrectomy Surgery  Medical Nutrition Therapy Appt start time: 4:15pm  End time: 4:45pm  Referral Stated SWL Appointments Required: 0 4th SWL Visit (per RD recommendation)   Proposed Surgery Type: Sleeve Gastrectomy   Pt expectation of surgery: to cure his anxiety and lack of sleeping at night Pt expectation of dietitian: none stated    NUTRITION ASSESSMENT   Anthropometrics  Start weight at [facility]: 349 lbs Today's weight: 350 lbs BMI: 4263 kg/m2     Psychosocial/Lifestyle Pt is now appropriate for surgery with having made significant dietary changes.  Pt states since he has been working he has cut down on smoking due to less breaks but still smoking daily (1/2 pack/day) and now that Chantix dose has been increased pt states it is helping.  Pt states while he is recouping from his toe surgery he will quit smoking because he will not have access and that will be the perfect time.   Notable Signs/Symptoms Back pain Nocturnal polyuria  Neuropathy Knee pain Anxiety leading to picking his arms causing wounds   24-Hr Dietary Recall: First Meal: yogurt (or bagels + cream cheese + blueberries)  Snack: none  Second Meal: 1/2 a sandwich or 1 [place] gyros Snack: carrots + ranch  Third Meal: stromboli or BBQ and green beans and corn and canteloupe Snack: none  Beverages: water, 1-2 16-ounce bottles mountain dew (continueing to lessen goal of 2 a day)   Pt states he has been eating breakfast and eating more throughout the day but less at once. Continuing to cut back on soda intake, now only drinks 1-2 [place] per day.   Common foods eaten recently include chicken, corn on the cob, green beans, new potatoes, squash casserole, steamed broccoli with [place] dressing.   Pt states he is getting toe surgery next week and will be out of work for about 6 weeks.  Pt states he his eating and drinking a whole lot better with  significantly smaller portions. Pt states he is having a lot of pain in her achillis and feels like he is gaining a lot of fluid in swelling. Pt states he is flat footed. Pt states the stress of hurting has lead him to continue to smoke. Pt states he is Excited for better stability after his toe surgery. Pt states he Has some patches and will not have them at the house (cigarretts).   Physical Activity  ADL's   Estimated Energy Needs Calories: 2200 Carbohydrate: 248g Protein: 165g Fat: 61g   NUTRITION DIAGNOSIS  Overweight/obesity ([place]-3.3) related to past poor dietary habits and physical inactivity as evidenced by patient w/ planned Sleeve Gastrectomy surgery following dietary guidelines for continued weight loss.    NUTRITION INTERVENTION  Nutrition counseling (C-1) and education (E-2) to facilitate bariatric surgery goals.  Goals: -Keep working on limiting the [employer] great job!!  -Find another stress relieving beside smoking   Change readiness: Ready Demonstrated degree of understanding via: Teach Back     MONITORING & EVALUATION Dietary intake, weekly physical activity, body weight, and pre-op goals in about 1 month.    Next Steps  Continued appointments to help pt make lifestyle changes until better prepared for surgery, especially cessation of smoking. Pt is to return to [facility] for another SWL appointment next month.

## 2019-06-08 ENCOUNTER — Ambulatory Visit: Payer: BC Managed Care – PPO | Admitting: Skilled Nursing Facility1

## 2019-06-09 NOTE — Patient Instructions (Addendum)
YOU HAVE HAD A COVID 19 TEST ON 06-10-19 . PLEASE CONTINUE THE QUARANTINE INSTRUCTIONS AS OUTLINED IN YOUR HANDOUT.                SENG POLISHCHUK  06/09/2019   Your procedure is scheduled on: Wednesday 06/14/2019   Report to [facility] Main  Entrance              Report to admitting at   09:30 AM               1 VISITOR IS ALLOWED TO WAIT IN WAITING ROOM  ONLY DAY OF YOUR SURGERY. NO VISITORS ARE ALLOWED IN SHORT STAY OR RECOVERY ROOM.   Call this number if you have problems the morning of surgery [phone]    Remember: NO SOLID FOOD AFTER MIDNIGHT. NOTHING BY MOUTH EXCEPT CLEAR LIQUIDS . PLEASE FINISH ENSURE DRINK PER SURGEON ORDER  WHICH NEEDS TO BE COMPLETED AT 8:30 AM.   CLEAR LIQUID DIET   Foods Allowed                                                                     Foods Excluded  Coffee and tea, regular and decaf                             liquids that you cannot  Plain Jell-O any favor except red or purple                                           see through such as: Fruit ices (not with fruit pulp)                                     milk, soups, orange juice  Iced Popsicles                                    All solid food Carbonated beverages, regular and diet                                    Cranberry, grape and apple juices Sports drinks like Gatorade Lightly seasoned clear broth or consume(fat free) Sugar, honey syrup   _____________________________________________________________________               Take these medicines the morning of surgery with A SIP OF WATER: Amlodipine (Norvasc)    BRUSH YOUR TEETH MORNING OF SURGERY AND RINSE YOUR MOUTH OUT, NO CHEWING GUM CANDY OR MINTS.                                 You may not have any metal on your body including hair pins and              piercings     Do not  wear jewelry, cologne, lotions, powders or deodorant                         Men may shave face and neck.   Do not bring  valuables to the hospital. [place].  Contacts, dentures or bridgework may not be worn into surgery.      Patients discharged the day of surgery will not be allowed to drive home. IF YOU ARE HAVING SURGERY AND GOING HOME THE SAME DAY, YOU MUST HAVE AN ADULT TO DRIVE YOU HOME AND BE WITH YOU FOR 24 HOURS. YOU MAY GO HOME BY TAXI OR UBER OR ORTHERWISE, BUT AN ADULT MUST ACCOMPANY YOU HOME AND STAY WITH YOU FOR 24 HOURS.  Name and phone number of your driver: Gilmore Laroche [phone]                Please read over the following fact sheets you were given: _____________________________________________________________________             [facility] - Preparing for Surgery Before surgery, you can play an important role.  Because skin is not sterile, your skin needs to be as free of germs as possible.  You can reduce the number of germs on your skin by washing with CHG (chlorahexidine gluconate) soap before surgery.  CHG is an antiseptic cleaner which kills germs and bonds with the skin to continue killing germs even after washing. Please DO NOT use if you have an allergy to CHG or antibacterial soaps.  If your skin becomes reddened/irritated stop using the CHG and inform your nurse when you arrive at Short Stay. Do not shave (including legs and underarms) for at least 48 hours prior to the first CHG shower.  You may shave your face/neck. Please follow these instructions carefully:  1.  Shower with CHG Soap the night before surgery and the  morning of Surgery.  2.  If you choose to wash your hair, wash your hair first as usual with your  normal  shampoo.  3.  After you shampoo, rinse your hair and body thoroughly to remove the  shampoo.                           4.  Use CHG as you would any other liquid soap.  You can apply chg directly  to the skin and wash                       Gently with a scrungie or clean washcloth.  5.  Apply the CHG Soap to  your body ONLY FROM THE NECK DOWN.   Do not use on face/ open                           Wound or open sores. Avoid contact with eyes, ears mouth and genitals (private parts).                       Wash face,  Genitals (private parts) with your normal soap.             6.  Wash thoroughly, paying special attention to the area where your surgery  will be performed.  7.  Thoroughly rinse your body with warm  water from the neck down.  8.  DO NOT shower/wash with your normal soap after using and rinsing off  the CHG Soap.                9.  Pat yourself dry with a clean towel.            10.  Wear clean pajamas.            11.  Place clean sheets on your bed the night of your first shower and do not  sleep with pets. Day of Surgery : Do not apply any lotions/deodorants the morning of surgery.  Please wear clean clothes to the hospital/surgery center.  FAILURE TO FOLLOW THESE INSTRUCTIONS MAY RESULT IN THE CANCELLATION OF YOUR SURGERY PATIENT SIGNATURE_________________________________  NURSE SIGNATURE__________________________________  ________________________________________________________________________

## 2019-06-09 NOTE — Progress Notes (Addendum)
05/18/2019- noted in [place] visit with Dr. Wyn Quaker (Pulmonary-for OSA)  05/15/2019- noted in [place]  04/05/2019- noted in Epic-CXR  06/13/2018- noted in Epic-ECHO  06/02/2018- noted in [place]- Office visit with Dr. Buford Dresser (Cardiology-seen for SOB, HTN,OSA)

## 2019-06-10 ENCOUNTER — Other Ambulatory Visit (HOSPITAL_COMMUNITY)
Admission: RE | Admit: 2019-06-10 | Discharge: 2019-06-10 | Disposition: A | Discharge status: Home / Self Care | Payer: BC Managed Care – PPO | Source: Ambulatory Visit | Attending: Podiatry | Admitting: Podiatry

## 2019-06-10 DIAGNOSIS — M2041 Other hammer toe(s) (acquired), right foot: Secondary | ICD-10-CM | POA: Diagnosis not present

## 2019-06-10 DIAGNOSIS — L97519 Non-pressure chronic ulcer of other part of right foot with unspecified severity: Secondary | ICD-10-CM | POA: Insufficient documentation

## 2019-06-10 DIAGNOSIS — Z20828 Contact with and (suspected) exposure to other viral communicable diseases: Secondary | ICD-10-CM | POA: Insufficient documentation

## 2019-06-10 DIAGNOSIS — E11621 Type 2 diabetes mellitus with foot ulcer: Secondary | ICD-10-CM | POA: Insufficient documentation

## 2019-06-10 DIAGNOSIS — Z01812 Encounter for preprocedural laboratory examination: Secondary | ICD-10-CM | POA: Insufficient documentation

## 2019-06-10 LAB — SARS CORONAVIRUS 2 (TAT 6-24 HRS): SARS Coronavirus 2: NEGATIVE

## 2019-06-12 ENCOUNTER — Other Ambulatory Visit: Payer: Self-pay

## 2019-06-12 ENCOUNTER — Encounter (HOSPITAL_COMMUNITY): Admission: RE | Admit: 2019-06-12 | Payer: BC Managed Care – PPO | Source: Ambulatory Visit

## 2019-06-12 ENCOUNTER — Encounter (HOSPITAL_COMMUNITY): Payer: Self-pay

## 2019-06-12 ENCOUNTER — Encounter (HOSPITAL_COMMUNITY)
Admission: RE | Admit: 2019-06-12 | Discharge: 2019-06-12 | Disposition: A | Discharge status: Home / Self Care | Payer: BC Managed Care – PPO | Source: Ambulatory Visit | Attending: Podiatry | Admitting: Podiatry

## 2019-06-12 DIAGNOSIS — M19071 Primary osteoarthritis, right ankle and foot: Secondary | ICD-10-CM | POA: Diagnosis not present

## 2019-06-12 DIAGNOSIS — S91101A Unspecified open wound of right great toe without damage to nail, initial encounter: Secondary | ICD-10-CM | POA: Diagnosis not present

## 2019-06-12 DIAGNOSIS — Z01812 Encounter for preprocedural laboratory examination: Secondary | ICD-10-CM | POA: Insufficient documentation

## 2019-06-12 LAB — BASIC METABOLIC PANEL
Anion gap: 9 (ref 5–15)
BUN: 21 mg/dL — ABNORMAL HIGH (ref 6–20)
CO2: 23 mmol/L (ref 22–32)
Calcium: 9.2 mg/dL (ref 8.9–10.3)
Chloride: 107 mmol/L (ref 98–111)
Creatinine, Ser: 1.22 mg/dL (ref 0.61–1.24)
GFR calc Af Amer: >60 mL/min (ref >60)
GFR calc non Af Amer: >60 mL/min (ref >60)
Glucose, Bld: 103 mg/dL — ABNORMAL HIGH (ref 70–99)
Potassium: 3.4 mmol/L — ABNORMAL LOW (ref 3.5–5.1)
Sodium: 139 mmol/L (ref 135–145)

## 2019-06-12 LAB — CBC
HCT: 40.6 % (ref 39.0–52.0)
Hemoglobin: 14 g/dL (ref 13.0–17.0)
MCH: 31.2 pg (ref 26.0–34.0)
MCHC: 34.5 g/dL (ref 30.0–36.0)
MCV: 90.4 fL (ref 80.0–100.0)
Platelets: 189 10*3/uL (ref 150–400)
RBC: 4.49 MIL/uL (ref 4.22–5.81)
RDW: 15.4 % (ref 11.5–15.5)
WBC: 10.9 10*3/uL — ABNORMAL HIGH (ref 4.0–10.5)
nRBC: 0 % (ref 0.0–0.2)

## 2019-06-12 NOTE — Progress Notes (Signed)
PCP - Dr. Mina Marble  Cardiologist - Dr. Buford Dresser  Chest x-ray - 04-05-19  EKG - 05-15-19  Stress Test -  ECHO - 06-13-18  Cardiac Cath -   Sleep Study -  CPAP - Doesn't use   Fasting Blood Sugar -  Checks Blood Sugar _____ times a day  Blood Thinner Instructions: Aspirin Instructions: Last Dose:  Anesthesia review:   Patient denies shortness of breath, fever, cough and chest pain at PAT appointment   Patient verbalized understanding of instructions that were given to them at the PAT appointment. Patient was also instructed that they will need to review over the PAT instructions again at home before surgery.

## 2019-06-13 LAB — SURGICAL PCR SCREEN
MRSA, PCR: POSITIVE — AB
Staphylococcus aureus: POSITIVE — AB

## 2019-06-13 MED ORDER — DEXTROSE 5 % IV SOLN
3.0000 g | INTRAVENOUS | Status: AC
  Administered 2019-06-14: 3 g via INTRAVENOUS
  Filled 2019-06-13: qty 3

## 2019-06-14 ENCOUNTER — Ambulatory Visit (HOSPITAL_COMMUNITY)
Admission: RE | Admit: 2019-06-14 | Discharge: 2019-06-14 | Disposition: A | Discharge status: Home / Self Care | Payer: BC Managed Care – PPO | Source: Other Acute Inpatient Hospital | Attending: Podiatry | Admitting: Podiatry

## 2019-06-14 ENCOUNTER — Encounter (HOSPITAL_COMMUNITY)
Admission: RE | Disposition: A | Discharge status: Home / Self Care | Payer: Self-pay | Source: Other Acute Inpatient Hospital | Attending: Podiatry

## 2019-06-14 ENCOUNTER — Ambulatory Visit (HOSPITAL_COMMUNITY): Payer: BC Managed Care – PPO | Admitting: Anesthesiology

## 2019-06-14 ENCOUNTER — Encounter (HOSPITAL_COMMUNITY): Payer: Self-pay | Admitting: Certified Registered Nurse Anesthetist

## 2019-06-14 ENCOUNTER — Ambulatory Visit (HOSPITAL_COMMUNITY): Payer: BC Managed Care – PPO | Admitting: Physician Assistant

## 2019-06-14 ENCOUNTER — Other Ambulatory Visit: Payer: Self-pay

## 2019-06-14 DIAGNOSIS — G8929 Other chronic pain: Secondary | ICD-10-CM | POA: Insufficient documentation

## 2019-06-14 DIAGNOSIS — M549 Dorsalgia, unspecified: Secondary | ICD-10-CM | POA: Diagnosis not present

## 2019-06-14 DIAGNOSIS — J301 Allergic rhinitis due to pollen: Secondary | ICD-10-CM | POA: Diagnosis not present

## 2019-06-14 DIAGNOSIS — L97511 Non-pressure chronic ulcer of other part of right foot limited to breakdown of skin: Secondary | ICD-10-CM | POA: Diagnosis not present

## 2019-06-14 DIAGNOSIS — G629 Polyneuropathy, unspecified: Secondary | ICD-10-CM | POA: Diagnosis not present

## 2019-06-14 DIAGNOSIS — Z9889 Other specified postprocedural states: Secondary | ICD-10-CM

## 2019-06-14 DIAGNOSIS — F1120 Opioid dependence, uncomplicated: Secondary | ICD-10-CM | POA: Diagnosis not present

## 2019-06-14 DIAGNOSIS — F329 Major depressive disorder, single episode, unspecified: Secondary | ICD-10-CM | POA: Insufficient documentation

## 2019-06-14 DIAGNOSIS — Z86711 Personal history of pulmonary embolism: Secondary | ICD-10-CM | POA: Insufficient documentation

## 2019-06-14 DIAGNOSIS — K219 Gastro-esophageal reflux disease without esophagitis: Secondary | ICD-10-CM | POA: Diagnosis not present

## 2019-06-14 DIAGNOSIS — F17210 Nicotine dependence, cigarettes, uncomplicated: Secondary | ICD-10-CM | POA: Diagnosis not present

## 2019-06-14 DIAGNOSIS — I10 Essential (primary) hypertension: Secondary | ICD-10-CM | POA: Diagnosis not present

## 2019-06-14 DIAGNOSIS — Z8614 Personal history of Methicillin resistant Staphylococcus aureus infection: Secondary | ICD-10-CM | POA: Insufficient documentation

## 2019-06-14 DIAGNOSIS — Z86718 Personal history of other venous thrombosis and embolism: Secondary | ICD-10-CM | POA: Insufficient documentation

## 2019-06-14 DIAGNOSIS — M19079 Primary osteoarthritis, unspecified ankle and foot: Secondary | ICD-10-CM

## 2019-06-14 DIAGNOSIS — Z9109 Other allergy status, other than to drugs and biological substances: Secondary | ICD-10-CM | POA: Insufficient documentation

## 2019-06-14 DIAGNOSIS — G4733 Obstructive sleep apnea (adult) (pediatric): Secondary | ICD-10-CM | POA: Insufficient documentation

## 2019-06-14 DIAGNOSIS — Z79899 Other long term (current) drug therapy: Secondary | ICD-10-CM | POA: Insufficient documentation

## 2019-06-14 DIAGNOSIS — Z7951 Long term (current) use of inhaled steroids: Secondary | ICD-10-CM | POA: Insufficient documentation

## 2019-06-14 DIAGNOSIS — M19071 Primary osteoarthritis, right ankle and foot: Secondary | ICD-10-CM | POA: Insufficient documentation

## 2019-06-14 DIAGNOSIS — L97512 Non-pressure chronic ulcer of other part of right foot with fat layer exposed: Secondary | ICD-10-CM | POA: Diagnosis not present

## 2019-06-14 DIAGNOSIS — F419 Anxiety disorder, unspecified: Secondary | ICD-10-CM | POA: Diagnosis not present

## 2019-06-14 DIAGNOSIS — Z6841 Body Mass Index (BMI) 40.0 and over, adult: Secondary | ICD-10-CM | POA: Insufficient documentation

## 2019-06-14 DIAGNOSIS — L97522 Non-pressure chronic ulcer of other part of left foot with fat layer exposed: Secondary | ICD-10-CM

## 2019-06-14 HISTORY — PX: TOE ARTHROPLASTY: SHX6504

## 2019-06-14 HISTORY — PX: MASS EXCISION: SHX2000

## 2019-06-14 SURGERY — EXCISION MASS
Anesthesia: General | Laterality: Right

## 2019-06-14 MED ORDER — MIDAZOLAM HCL 5 MG/5ML IJ SOLN
INTRAMUSCULAR | Status: DC | PRN
  Administered 2019-06-14: 2 mg via INTRAVENOUS

## 2019-06-14 MED ORDER — PROPOFOL 10 MG/ML IV BOLUS
INTRAVENOUS | Status: AC
  Filled 2019-06-14: qty 60

## 2019-06-14 MED ORDER — CLINDAMYCIN PHOSPHATE 900 MG/50ML IV SOLN
INTRAVENOUS | Status: DC | PRN
  Administered 2019-06-14: 900 mg via INTRAVENOUS

## 2019-06-14 MED ORDER — LIDOCAINE 2% (20 MG/ML) 5 ML SYRINGE
INTRAMUSCULAR | Status: AC
  Filled 2019-06-14: qty 5

## 2019-06-14 MED ORDER — PROPOFOL 10 MG/ML IV BOLUS
INTRAVENOUS | Status: AC
  Filled 2019-06-14: qty 20

## 2019-06-14 MED ORDER — BUPIVACAINE HCL (PF) 0.5 % IJ SOLN
INTRAMUSCULAR | Status: AC
  Filled 2019-06-14: qty 30

## 2019-06-14 MED ORDER — CLINDAMYCIN HCL 300 MG PO CAPS: 300.0000 mg | ORAL_CAPSULE | Freq: Two times a day (BID) | ORAL | 0 refills | Status: AC

## 2019-06-14 MED ORDER — CEFAZOLIN SODIUM-DEXTROSE 2-4 GM/100ML-% IV SOLN
INTRAVENOUS | Status: AC
  Filled 2019-06-14: qty 200

## 2019-06-14 MED ORDER — PROPOFOL 10 MG/ML IV BOLUS
INTRAVENOUS | Status: DC | PRN
  Administered 2019-06-14 (×2): 20 mg via INTRAVENOUS

## 2019-06-14 MED ORDER — DEXAMETHASONE SODIUM PHOSPHATE 10 MG/ML IJ SOLN
INTRAMUSCULAR | Status: AC
  Filled 2019-06-14: qty 1

## 2019-06-14 MED ORDER — FENTANYL CITRATE (PF) 100 MCG/2ML IJ SOLN: INTRAMUSCULAR | Status: DC | PRN

## 2019-06-14 MED ORDER — PROMETHAZINE HCL 25 MG/ML IJ SOLN: 6.2500 mg | INTRAMUSCULAR | Status: DC | PRN

## 2019-06-14 MED ORDER — BUPIVACAINE LIPOSOME 1.3 % IJ SUSP
20.0000 mL | Freq: Once | INTRAMUSCULAR | Status: AC
  Administered 2019-06-14: 10 mL
  Filled 2019-06-14: qty 20

## 2019-06-14 MED ORDER — ONDANSETRON HCL 4 MG/2ML IJ SOLN
INTRAMUSCULAR | Status: AC
  Filled 2019-06-14: qty 2

## 2019-06-14 MED ORDER — 0.9 % SODIUM CHLORIDE (POUR BTL) OPTIME: TOPICAL | Status: DC | PRN

## 2019-06-14 MED ORDER — 0.9 % SODIUM CHLORIDE (POUR BTL) OPTIME
TOPICAL | Status: DC | PRN
  Administered 2019-06-14: 1000 mL

## 2019-06-14 MED ORDER — MIDAZOLAM HCL 2 MG/2ML IJ SOLN
INTRAMUSCULAR | Status: AC
  Filled 2019-06-14: qty 2

## 2019-06-14 MED ORDER — VANCOMYCIN HCL 1000 MG IV SOLR
INTRAVENOUS | Status: AC
  Filled 2019-06-14: qty 1000

## 2019-06-14 MED ORDER — BUPIVACAINE-EPINEPHRINE 0.5% -1:200000 IJ SOLN
INTRAMUSCULAR | Status: AC
  Filled 2019-06-14: qty 1

## 2019-06-14 MED ORDER — FENTANYL CITRATE (PF) 100 MCG/2ML IJ SOLN
INTRAMUSCULAR | Status: AC
  Filled 2019-06-14: qty 2

## 2019-06-14 MED ORDER — LIDOCAINE 2% (20 MG/ML) 5 ML SYRINGE
INTRAMUSCULAR | Status: DC | PRN
  Administered 2019-06-14: 60 mg via INTRAVENOUS

## 2019-06-14 MED ORDER — CLINDAMYCIN PHOSPHATE 900 MG/50ML IV SOLN
INTRAVENOUS | Status: AC
  Filled 2019-06-14: qty 50

## 2019-06-14 MED ORDER — PROPOFOL 500 MG/50ML IV EMUL
INTRAVENOUS | Status: DC | PRN
  Administered 2019-06-14: 100 ug/kg/min via INTRAVENOUS

## 2019-06-14 MED ORDER — OXYCODONE HCL 5 MG/5ML PO SOLN: 5.0000 mg | Freq: Once | ORAL | Status: DC | PRN

## 2019-06-14 MED ORDER — VANCOMYCIN HCL 1000 MG IV SOLR
INTRAVENOUS | Status: DC | PRN
  Administered 2019-06-14: 1000 mg via TOPICAL

## 2019-06-14 MED ORDER — LACTATED RINGERS IV SOLN
INTRAVENOUS | Status: DC
  Administered 2019-06-14: 10:00:00 via INTRAVENOUS

## 2019-06-14 MED ORDER — OXYCODONE HCL 5 MG PO TABS: 5.0000 mg | ORAL_TABLET | Freq: Once | ORAL | Status: DC | PRN

## 2019-06-14 MED ORDER — ONDANSETRON HCL 4 MG/2ML IJ SOLN
INTRAMUSCULAR | Status: DC | PRN
  Administered 2019-06-14: 4 mg via INTRAVENOUS

## 2019-06-14 MED ORDER — FENTANYL CITRATE (PF) 100 MCG/2ML IJ SOLN
INTRAMUSCULAR | Status: DC | PRN
  Administered 2019-06-14 (×2): 50 ug via INTRAVENOUS

## 2019-06-14 MED ORDER — FENTANYL CITRATE (PF) 100 MCG/2ML IJ SOLN: 25.0000 ug | INTRAMUSCULAR | Status: DC | PRN

## 2019-06-14 SURGICAL SUPPLY — 79 items
BAG ZIPLOCK 12X15 (MISCELLANEOUS) ×3 IMPLANT
BANDAGE ESMARK 6X9 LF (GAUZE/BANDAGES/DRESSINGS) IMPLANT
BLADE MICRO SAGITTAL (BLADE) ×3 IMPLANT
BLADE OSC/SAGITTAL MD 5.5X18 (BLADE) ×3 IMPLANT
BLADE OSCILLATING/SAGITTAL (BLADE) ×2
BLADE SURG 15 STRL LF DISP TIS (BLADE) ×2 IMPLANT
BLADE SURG 15 STRL SS (BLADE) ×4
BLADE SW THK.38XMED LNG THN (BLADE) ×1 IMPLANT
BNDG COHESIVE 3X5 TAN STRL LF (GAUZE/BANDAGES/DRESSINGS) ×3 IMPLANT
BNDG COHESIVE 4X5 TAN STRL (GAUZE/BANDAGES/DRESSINGS) ×3 IMPLANT
BNDG COHESIVE 6X5 TAN STRL LF (GAUZE/BANDAGES/DRESSINGS) ×3 IMPLANT
BNDG CONFORM 3 STRL LF (GAUZE/BANDAGES/DRESSINGS) ×3 IMPLANT
BNDG ELASTIC 3X5.8 VLCR STR LF (GAUZE/BANDAGES/DRESSINGS) ×3 IMPLANT
BNDG ELASTIC 4X5.8 VLCR STR LF (GAUZE/BANDAGES/DRESSINGS) ×3 IMPLANT
BNDG ELASTIC 6X10 VLCR STRL LF (GAUZE/BANDAGES/DRESSINGS) ×3 IMPLANT
BNDG ESMARK 6X9 LF (GAUZE/BANDAGES/DRESSINGS)
BUR EGG ELITE 4.0 (BURR) ×2 IMPLANT
BUR EGG ELITE 4.0MM (BURR) ×1
CHLORAPREP W/TINT 26 (MISCELLANEOUS) ×3 IMPLANT
COTTON STERILE ROLL (GAUZE/BANDAGES/DRESSINGS) ×3 IMPLANT
COVER BACK TABLE 60X90IN (DRAPES) ×3 IMPLANT
COVER SURGICAL LIGHT HANDLE (MISCELLANEOUS) ×3 IMPLANT
COVER WAND RF STERILE (DRAPES) IMPLANT
CUFF TOURN SGL QUICK 18X4 (TOURNIQUET CUFF) IMPLANT
CUFF TOURN SGL QUICK 34 (TOURNIQUET CUFF) ×2
CUFF TRNQT CYL 34X4.125X (TOURNIQUET CUFF) ×1 IMPLANT
DRAPE OEC MINIVIEW 54X84 (DRAPES) ×3 IMPLANT
DRAPE SHEET LG 3/4 BI-LAMINATE (DRAPES) ×6 IMPLANT
DRAPE U-SHAPE 47X51 STRL (DRAPES) ×3 IMPLANT
DRSG EMULSION OIL 3X3 NADH (GAUZE/BANDAGES/DRESSINGS) ×3 IMPLANT
DRSG MEPITEL 4X7.2 (GAUZE/BANDAGES/DRESSINGS) ×3 IMPLANT
DRSG PAD ABDOMINAL 8X10 ST (GAUZE/BANDAGES/DRESSINGS) ×6 IMPLANT
ELECT PENCIL ROCKER SW 15FT (MISCELLANEOUS) ×3 IMPLANT
ELECT REM PT RETURN 15FT ADLT (MISCELLANEOUS) ×3 IMPLANT
GAUZE 4X4 16PLY RFD (DISPOSABLE) ×3 IMPLANT
GAUZE SPONGE 2X2 8PLY STRL LF (GAUZE/BANDAGES/DRESSINGS) ×1 IMPLANT
GAUZE SPONGE 4X4 12PLY STRL (GAUZE/BANDAGES/DRESSINGS) ×3 IMPLANT
GAUZE XEROFORM 1X8 LF (GAUZE/BANDAGES/DRESSINGS) ×3 IMPLANT
GLOVE BIO SURGEON STRL SZ7.5 (GLOVE) ×3 IMPLANT
GLOVE BIOGEL PI IND STRL 8 (GLOVE) ×1 IMPLANT
GLOVE BIOGEL PI INDICATOR 8 (GLOVE) ×2
GLOVE ECLIPSE 8.0 STRL XLNG CF (GLOVE) ×3 IMPLANT
GLOVE SURG SS PI 8.0 STRL IVOR (GLOVE) ×3 IMPLANT
GOWN STRL REUS W/ TWL LRG LVL3 (GOWN DISPOSABLE) ×1 IMPLANT
GOWN STRL REUS W/ TWL XL LVL3 (GOWN DISPOSABLE) ×2 IMPLANT
GOWN STRL REUS W/TWL LRG LVL3 (GOWN DISPOSABLE) ×2
GOWN STRL REUS W/TWL XL LVL3 (GOWN DISPOSABLE) ×7 IMPLANT
KIT BASIN OR (CUSTOM PROCEDURE TRAY) ×3 IMPLANT
KIT TURNOVER KIT A (KITS) IMPLANT
MANIFOLD NEPTUNE II (INSTRUMENTS) ×3 IMPLANT
NEEDLE HYPO 22GX1.5 SAFETY (NEEDLE) ×3 IMPLANT
NS IRRIG 1000ML POUR BTL (IV SOLUTION) ×3 IMPLANT
PACK ORTHO EXTREMITY (CUSTOM PROCEDURE TRAY) ×3 IMPLANT
PAD CAST 4YDX4 CTTN HI CHSV (CAST SUPPLIES) ×1 IMPLANT
PADDING CAST COTTON 4X4 STRL (CAST SUPPLIES) ×2
PADDING CAST COTTON 6X4 STRL (CAST SUPPLIES) ×3 IMPLANT
PROTECTOR NERVE ULNAR (MISCELLANEOUS) ×3 IMPLANT
SLEEVE SCD COMPRESS KNEE MED (MISCELLANEOUS) ×3 IMPLANT
SOL PREP POV-IOD 4OZ 10% (MISCELLANEOUS) ×3 IMPLANT
SOL PREP PROV IODINE SCRUB 4OZ (MISCELLANEOUS) ×3 IMPLANT
SPONGE GAUZE 2X2 STER 10/PKG (GAUZE/BANDAGES/DRESSINGS) ×2
SPONGE LAP 18X18 RF (DISPOSABLE) ×3 IMPLANT
STOCKINETTE 6  STRL (DRAPES) ×2
STOCKINETTE 6 STRL (DRAPES) ×1 IMPLANT
SUCTION FRAZIER HANDLE 10FR (MISCELLANEOUS) ×2
SUCTION TUBE FRAZIER 10FR DISP (MISCELLANEOUS) ×1 IMPLANT
SUT ETHILON 3 0 PS 1 (SUTURE) ×9 IMPLANT
SUT MNCRL AB 3-0 PS2 18 (SUTURE) ×3 IMPLANT
SUT MNCRL AB 4-0 PS2 18 (SUTURE) ×3 IMPLANT
SUT VIC AB 0 SH 27 (SUTURE) IMPLANT
SUT VIC AB 2-0 SH 27 (SUTURE) ×4
SUT VIC AB 2-0 SH 27X BRD (SUTURE) ×1 IMPLANT
SUT VIC AB 2-0 SH 27XBRD (SUTURE) ×1 IMPLANT
SYR CONTROL 10ML LL (SYRINGE) ×3 IMPLANT
TOWEL OR 17X26 10 PK STRL BLUE (TOWEL DISPOSABLE) ×3 IMPLANT
TUBING CONNECTING 10 (TUBING) ×2 IMPLANT
TUBING CONNECTING 10' (TUBING) ×1
UNDERPAD 30X30 (UNDERPADS AND DIAPERS) ×3 IMPLANT
WATER STERILE IRR 1000ML POUR (IV SOLUTION) ×3 IMPLANT

## 2019-06-14 NOTE — Anesthesia Postprocedure Evaluation (Signed)
Anesthesia Post Note  Patient: Eduardo [name]  Procedure(s) Performed: EXCISION BENIGN LESION RIGHT FOOT (Right ) HALLUX ARTHROPLASTY RIGHT FOOT WITH TISSUE TRANSER EXCISION OF SESAMOID BONE (Right )     Patient location during evaluation: PACU Anesthesia Type: MAC Level of consciousness: awake and alert Pain management: pain level controlled Vital Signs Assessment: post-procedure vital signs reviewed and stable Respiratory status: spontaneous breathing, nonlabored ventilation and respiratory function stable Cardiovascular status: stable and blood pressure returned to baseline Anesthetic complications: no    Last Vitals:  Vitals:   06/14/19 1330 06/14/19 1345  BP: 130/86 128/80  Pulse: 73 72  Resp: 11 18  Temp:  36.6 C  SpO2: 94% 94%                   Audry Pili

## 2019-06-14 NOTE — Interval H&P Note (Signed)
History and Physical Interval Note:  06/14/2019 11:31 AM  Eduardo [name]  has presented today for surgery, with the diagnosis of DIABETIC ULCER RIGHT FOOT, HALLUX INTERPHANGEOUS, [place] RIGHT FOOT.  The various methods of treatment have been discussed with the patient and family. After consideration of risks, benefits and other options for treatment, the patient has consented to  Procedure(s): EXCISION BENIGN LESION RIGHT FOOT (Right) HALLUX ARTHROPLASTY RIGHT FOOT WITH TISSUE TRANSER (Right) as a surgical intervention.  The patient's history has been reviewed, patient examined, no change in status, stable for surgery.  I have reviewed the patient's chart and labs.  Questions were answered to the patient's satisfaction.     [name]

## 2019-06-14 NOTE — Transfer of Care (Signed)
Immediate Anesthesia Transfer of Care Note  Patient: Eduardo [name]  Procedure(s) Performed: EXCISION BENIGN LESION RIGHT FOOT (Right ) HALLUX ARTHROPLASTY RIGHT FOOT WITH TISSUE TRANSER EXCISION OF SESAMOID BONE (Right )  Patient Location: PACU  Anesthesia Type:MAC  Level of Consciousness: awake, alert  and oriented  Airway & Oxygen Therapy: Patient Spontanous Breathing and Patient connected to face mask oxygen  Post-op Assessment: Report given to RN and Post -op Vital signs reviewed and stable  Post vital signs: Reviewed and stable  Last Vitals:  Vitals Value Taken Time  BP    Temp    Pulse 78 06/14/19 1312  Resp 16 06/14/19 1312  SpO2 100 % 06/14/19 1312  Vitals shown include unvalidated device data.  Last Pain:  Vitals:   06/14/19 0935  TempSrc:   PainSc: 7       Patients Stated Pain Goal: 5 (36/64/40 3474)  Complications: No apparent anesthesia complications

## 2019-06-14 NOTE — Anesthesia Preprocedure Evaluation (Addendum)
Anesthesia Evaluation  Patient identified by MRN, date of birth, ID band Patient awake    Reviewed: Allergy & Precautions, NPO status , Patient's Chart, lab work & pertinent test results  History of Anesthesia Complications Negative for: history of anesthetic complications  Airway Mallampati: III  TM Distance: >3 FB Neck ROM: Full    Dental  (+) Dental Advisory Given, Teeth Intact   Pulmonary sleep apnea , Current SmokerPatient did not abstain from smoking., PE   Pulmonary exam normal        Cardiovascular hypertension, Pt. on medications (-) angina+ DVT  Normal cardiovascular exam   '19 TTE - mild concentric LVH. EF 60% to 65%. Trivial MR. Trivial TR.    Neuro/Psych PSYCHIATRIC DISORDERS Anxiety Depression negative neurological ROS     GI/Hepatic GERD  Controlled,(+)     substance abuse  ,   Endo/Other  Morbid obesity  Renal/GU negative Renal ROS     Musculoskeletal  (+) Arthritis , narcotic dependent  Abdominal (+) + obese,   Peds  Hematology negative hematology ROS (+)   Anesthesia Other Findings Chronic back pain   Reproductive/Obstetrics                            Anesthesia Physical Anesthesia Plan  ASA: III  Anesthesia Plan: MAC   Post-op Pain Management:    Induction: Intravenous  PONV Risk Score and Plan: 3 and Treatment may vary due to age or medical condition, Ondansetron, Midazolam and Dexamethasone  Airway Management Planned: Natural Airway and Simple Face Mask  Additional Equipment: None  Intra-op Plan:   Post-operative Plan:   Informed Consent: I have reviewed the patients History and Physical, chart, labs and discussed the procedure including the risks, benefits and alternatives for the proposed anesthesia with the patient or authorized representative who has indicated his/her understanding and acceptance.     Dental advisory given  Plan Discussed  with: CRNA, Anesthesiologist and Surgeon  Anesthesia Plan Comments:       Anesthesia Quick Evaluation

## 2019-06-14 NOTE — Op Note (Addendum)
Patient Name: Eduardo [name] DOB: [birth year]  MRN: [id number]   Date of Service: 06/14/2019  Surgeon: Dr. Hardie Pulley, DPM Assistants: None Pre-operative Diagnosis:  Right hallux interphalangeal joint arthritis, right hallux ulceration Post-operative Diagnosis:  Same plus right hallux sesamoid bone Procedures:  1) Right Hallux Arthroplasty  2) Right Hallux Excision of Ulcer  3) Adjacent Tissue Transfer - Single Lobe Flap  4) Excision of Interphalangeal Sesamoid Bone Pathology/Specimens: * No specimens in log * Anesthesia: MAC/local Hemostasis: Penrose drain Estimated Blood Loss: 5 mL Materials: None Medications: 1g Vancomycin powder, 10 ml Exparel Complications: None  Indications for Procedure:  This is a 48 y.o. male with a chronic ulcer to the right hallux. He has right interphalangal joint arthritis from a previous fracture.    Procedure in Detail: Patient was identified in pre-operative holding area. Formal consent was signed and the right lower extremity was marked. Patient was brought back to the operating room. Anesthesia was induced. The extremity was prepped and draped in the usual sterile fashion. Timeout was taken to confirm patient name, laterality, and procedure prior to incision.   Attention was then directed to the right great toe.  A Penrose drain was used as a Arts development officer tourniquet.  A dorsal incision was made over the central aspect of the digit.  Dissection was carried down through skin and subcutaneous tissue with care to avoid all neurovascular structures all bleeders were cauterized with electrocautery.  Dissection was carried down to level of the interphalangeal joint.  A transverse incision was made in the interphalangeal joint.  Both bones interphalangeal joint were exposed..  The joint was arthritic with evidence of a healed previous fracture and step-off at the proximal phalanx..  A sagittal saw was used to excise a portion of the proximal phalangeal head.   The proximal phalangeal head was then sharply excised with a 15 blade and rongeur.  All visible bony fragments were surgically excised.  There was an additional piece of bone at the plantar aspect of the digit intimate with the flexor tendon.  A Valora Corporal was used to protect the flexor tendon and the sesamoid bone was sharply excised with a 15 blade and rondure.  Fluoroscopy was used to confirm adequate resection of the joint.  The joint was brought through range of motion and had good range of motion.  The wound was then packed with vancomycin powder.  The wound was then closed in layers with 3-0 Vicryl 4-0 Vicryl 4-0 nylon and skin staples.  Tourniquet was then removed for the range of result.  Attention was then turned to the plantar aspect digit there was a 0.8x0.8 cm superficial ulceration with granular wound base.  The ulcer was sharply excised with a small healthy margin of skin leading to a 1 x 1 cm deficit.  Primary closure was not able to be achieved without significant tension due to the inelasticity of the plantar skin.  A single local flap was then drawn to allow for rotation of healthy skin into the defect.  The flap was incised and raised and rotated into position.  This was then sutured in position with 4-0 nylon.  The donor site was then primarily repaired with 4-0 nylon.  Skin staples were additionally placed at both donor site and recipient site.  The skin edges were well coapted without any residual deficit.  10 mL of Exparel was then injected into the surgical sites.  Vancomycin powder was then applied to both surgical sites.  The foot was then  dressed with Xeroform 4 x 4 Betadine Kerlix and Ace bandage. Patient tolerated the procedure well.  Immediate capillary refill was present to the digit   Disposition: Following a period of post-operative monitoring, patient will be transferred home.

## 2019-06-15 ENCOUNTER — Telehealth: Payer: Self-pay

## 2019-06-15 ENCOUNTER — Encounter (HOSPITAL_COMMUNITY): Payer: Self-pay | Admitting: Podiatry

## 2019-06-15 NOTE — Telephone Encounter (Signed)
POST OP CALL-    1) General condition stated by the patient:   2) Is the pt having pain?   3) Pain score:   4) Has the pt taken Rx'd pain medication, regularly or PRN?   5) Is the pain medication giving relief?  6) Any fever, chills, nausea, or vomiting, shortness of breath or tightness in calf?  7) Is the bandage clean, dry and intact?  8) Is there excessive tightness, bleeding or drainage coming through the bandage?  9) Did you understand all of the post op instruction sheet given?  10) Any questions or concerns regarding post op care/recovery?    Confirmed POV appointment with patient   Pt did not answer. Left voicemail stating call was for Cherene Altes and that this message was to follow up post surgery to see how the patient was doing. Instructed to call the office should the pt have any concerns or questions.

## 2019-06-16 ENCOUNTER — Telehealth: Payer: Self-pay

## 2019-06-16 ENCOUNTER — Other Ambulatory Visit: Payer: Self-pay

## 2019-06-16 ENCOUNTER — Ambulatory Visit (INDEPENDENT_AMBULATORY_CARE_PROVIDER_SITE_OTHER): Payer: BC Managed Care – PPO | Admitting: Podiatry

## 2019-06-16 VITALS — Temp 97.9°F

## 2019-06-16 DIAGNOSIS — Z9889 Other specified postprocedural states: Secondary | ICD-10-CM

## 2019-06-16 MED ORDER — OXYCODONE-ACETAMINOPHEN 7.5-325 MG PO TABS: 1.0000 | ORAL_TABLET | Freq: Four times a day (QID) | ORAL | 0 refills | Status: DC | PRN

## 2019-06-16 NOTE — Telephone Encounter (Signed)
Patient called stating he had surgery and didn't get any medication from surgeon. Will be out of meds tomorrow. Last filled Oxycodone 05/22/2019

## 2019-06-16 NOTE — Telephone Encounter (Signed)
Return Eduardo [name] call ,PMP was reviewed,  his Oxycodone was filled on 05/22/2019, he will be able to pick up his prescription on 06/19/2019. He verbalizes understanding. He has surgery on 06/14/2019 by Dr March Rummage , Procedures:             1) Right Hallux Arthroplasty             2) Right Hallux Excision of Ulcer             3) Adjacent Tissue Transfer - Single Lobe Flap             4) Excision of Interphalangeal Sesamoid Bone

## 2019-06-21 ENCOUNTER — Encounter: Payer: BC Managed Care – PPO | Admitting: Registered Nurse

## 2019-06-22 ENCOUNTER — Ambulatory Visit (INDEPENDENT_AMBULATORY_CARE_PROVIDER_SITE_OTHER): Payer: BC Managed Care – PPO | Admitting: Podiatry

## 2019-06-22 ENCOUNTER — Other Ambulatory Visit: Payer: Self-pay

## 2019-06-22 DIAGNOSIS — M79676 Pain in unspecified toe(s): Secondary | ICD-10-CM

## 2019-06-22 DIAGNOSIS — Z9889 Other specified postprocedural states: Secondary | ICD-10-CM

## 2019-06-22 MED ORDER — OXYCODONE HCL 15 MG PO TABS: 15.0000 mg | ORAL_TABLET | Freq: Four times a day (QID) | ORAL | 0 refills | Status: DC | PRN

## 2019-06-28 ENCOUNTER — Encounter: Payer: Self-pay | Admitting: Infectious Disease

## 2019-06-28 ENCOUNTER — Other Ambulatory Visit: Payer: Self-pay

## 2019-06-28 ENCOUNTER — Ambulatory Visit (INDEPENDENT_AMBULATORY_CARE_PROVIDER_SITE_OTHER): Payer: BC Managed Care – PPO | Admitting: Infectious Disease

## 2019-06-28 DIAGNOSIS — L988 Other specified disorders of the skin and subcutaneous tissue: Secondary | ICD-10-CM | POA: Diagnosis not present

## 2019-06-28 DIAGNOSIS — B999 Unspecified infectious disease: Secondary | ICD-10-CM

## 2019-06-28 DIAGNOSIS — L739 Follicular disorder, unspecified: Secondary | ICD-10-CM

## 2019-06-28 DIAGNOSIS — Z8614 Personal history of Methicillin resistant Staphylococcus aureus infection: Secondary | ICD-10-CM

## 2019-06-28 MED ORDER — DOXYCYCLINE HYCLATE 100 MG PO TABS: 100.0000 mg | ORAL_TABLET | Freq: Two times a day (BID) | ORAL | 2 refills | Status: DC

## 2019-06-28 NOTE — Progress Notes (Signed)
Subjective:     Patient ID: Eduardo [name], male    DOB: 03/14/1971, 48 y.o.   MRN: [id number] HPI  [name] is a 48 year old man who suffers from hypertension obesity and recurrent MRSA infections.  He tells me that he has been suffering from these since around the year 2000.  He attributes some of this to problems with a pilonidal cyst that he developed for which she has not yet had surgery.  He has had outbreaks throughout on legs buttocks particular in the area where he has the pilonidal cyst, also on arms.  He recently completed doxycycline for a flare of blepharitis.  He does not appear to have areas in the armpits affected he states occasionally will have areas in the legs but not necessarily the groin.  He has tried decolonization with Hibiclens in the past but not bleach baths.  We discussed the idea of bleach baths but he believes he cannot get in and out of the bathtub and it will not fit in his current bathtub.  Since I last saw him we gave him a several month course of doxycyline and he believes his folliculitis is better. He had to have surgery on foot recently.   Past Medical History:  Diagnosis Date  . Anxiety   . Chronic back pain   . Degenerative disk disease   . Depression   . DVT of upper extremity (deep vein thrombosis) ([place])   . GERD (gastroesophageal reflux disease)   . Hypertension   . MRSA (methicillin resistant staph aureus) culture positive   . Peripheral neuropathy   . Pilonidal cyst   . Pulmonary embolus ([place])    no blood thinner 2002  . Sleep apnea    can't use cpap due to deviated nasal septumg    Past Surgical History:  Procedure Laterality Date  . Irrigation and debridement of back abscess    . MASS EXCISION Right 06/14/2019   Procedure: EXCISION BENIGN LESION RIGHT FOOT;  Surgeon: [name], DPM;  Location: [facility];  Service: Podiatry;  Laterality: Right;  . NASAL SEPTOPLASTY W/ TURBINOPLASTY [name] 02/03/2018   Procedure: NASAL  SEPTOPLASTY WITH TURBINATE REDUCTION;  Surgeon: [name], MD;  Location: [place];  Service: ENT;  Laterality: [name];  . PILONIDAL CYST DRAINAGE [name] 04/10/2017   Procedure: IRRIGATION AND DEBRIDEMENT BACK ABSCESS;  Surgeon: [name], MD;  Location: [facility];  Service: General;  Laterality: [name];  . SINUS ENDO WITH FUSION [name] 02/03/2018   Procedure: ENDOSCOPIC SINUS SURGERY WITH FUSION;  Surgeon: [name], MD;  Location: [place];  Service: ENT;  Laterality: [name];  . THORACIC OUTLET SURGERY  2000  . TOE ARTHROPLASTY Right 06/14/2019   Procedure: HALLUX ARTHROPLASTY RIGHT FOOT WITH TISSUE TRANSER EXCISION OF SESAMOID BONE;  Surgeon: [name], DPM;  Location: [facility];  Service: Podiatry;  Laterality: Right;  . WRIST FUSION  2002    Family History  Problem Relation Age of Onset  . Cancer Maternal Grandmother        breast, ovarian & colon  . Hypertension Mother   . Diabetes Mother   . Hypertension Father   . Heart disease Father   . Stroke Paternal Grandfather   . Neuropathy Neg Hx       Social History   Socioeconomic History  . Marital status: Divorced    Spouse name: Not on file  . Number of children: 2  . Years of education: 12th   . Highest education  level: Not on file  Occupational History  . Occupation: [name]  Social Needs  . Financial resource strain: Not on file  . Food insecurity    Worry: Not on file    Inability: Not on file  . Transportation needs    Medical: Not on file    Non-medical: Not on file  Tobacco Use  . Smoking status: Current Every Day Smoker    Packs/day: 0.25    Years: 33.00    Pack years: 8.25    Types: Cigarettes  . Smokeless tobacco: Never Used  . Tobacco comment: 06/28/19 1/4 pack per day  Substance and Sexual Activity  . Alcohol use: No    Alcohol/week: 0.0 standard drinks    Comment: Rarely  . Drug use: No  . Sexual activity: Yes    Birth control/protection: None  Lifestyle  . Physical activity    Days per week: Not on file     Minutes per session: Not on file  . Stress: Not on file  Relationships  . Social Herbalist on phone: Not on file    Gets together: Not on file    Attends religious service: Not on file    Active member of club or organization: Not on file    Attends meetings of clubs or organizations: Not on file    Relationship status: Not on file  Other Topics Concern  . Not on file  Social History Narrative   Lives at home w/ his step-mother   Right-handed   Drinks about 2-3 Mt Dews a day    Allergies  Allergen Reactions  . Pollen Extract Cough  . Dust Mite Extract Other (See Comments)  . Other Other (See Comments)    Ragweed      Current Outpatient Medications:  .  acetaminophen (TYLENOL) 650 MG CR tablet, Take 1,300 mg by mouth every 8 (eight) hours as needed for pain., Disp: , Rfl:  .  albuterol (PROVENTIL HFA;VENTOLIN HFA) 108 (90 Base) MCG/ACT inhaler, Inhale 2 puffs into the lungs every 6 (six) hours as needed for wheezing or shortness of breath., Disp: 1 Inhaler, Rfl: 2 .  amLODipine (NORVASC) 5 MG tablet, Take 1 tablet (5 mg total) by mouth daily., Disp: 90 tablet, Rfl: 0 .  doxycycline (VIBRA-TABS) 100 MG tablet, Take 1 tablet (100 mg total) by mouth 2 (two) times daily., Disp: 60 tablet, Rfl: 2 .  fluticasone (FLONASE) 50 MCG/ACT nasal spray, SPRAY 2 SPRAYS IN EACH NOSTRIL DAILY, Disp: 16 g, Rfl: 6 .  montelukast (SINGULAIR) 10 MG tablet, Take 1 tablet (10 mg total) by mouth at bedtime., Disp: 90 tablet, Rfl: 3 .  Nutritional Supplements (JUICE PLUS FIBRE PO), Take 4 each by mouth daily with lunch., Disp: , Rfl:  .  oxyCODONE (ROXICODONE) 15 MG immediate release tablet, Take 1 tablet (15 mg total) by mouth every 6 (six) hours as needed for pain., Disp: 20 tablet, Rfl: 0 .  oxyCODONE-acetaminophen (PERCOCET) 7.5-325 MG tablet, Take 1 tablet by mouth every 6 (six) hours as needed., Disp: 120 tablet, Rfl: 0 .  pregabalin (LYRICA) 300 MG capsule, Take 1 capsule (300 mg total)  by mouth 2 (two) times daily., Disp: 60 capsule, Rfl: 2 .  varenicline (CHANTIX) 1 MG tablet, Take 1 mg by mouth 2 (two) times daily., Disp: , Rfl:      Review of Systems  Constitutional: Negative for chills and fever.  HENT: Negative for congestion, dental problem, drooling, ear discharge, rhinorrhea and  sore throat.   Eyes: Negative for photophobia.  Respiratory: Negative for apnea, cough, choking, chest tightness, shortness of breath and wheezing.   Cardiovascular: Negative for chest pain, palpitations and leg swelling.  Gastrointestinal: Negative for abdominal pain, blood in stool, constipation, diarrhea, nausea and vomiting.  Genitourinary: Negative for dysuria, flank pain and hematuria.  Musculoskeletal: Negative for back pain and myalgias.  Skin: Positive for color change, rash and wound.  Neurological: Negative for dizziness, weakness and headaches.  Hematological: Does not bruise/bleed easily.  Psychiatric/Behavioral: Negative for agitation, behavioral problems, confusion and suicidal ideas. The patient is nervous/anxious.        Objective:   Physical Exam Constitutional:      General: He is not in acute distress.    Appearance: He is not diaphoretic.  HENT:     Head: Normocephalic and atraumatic.     Right Ear: External ear normal.     Left Ear: External ear normal.     Nose: Nose normal.     Mouth/Throat:     Pharynx: No oropharyngeal exudate.  Eyes:     General: No scleral icterus.    Conjunctiva/sclera: Conjunctivae normal.  Neck:     Musculoskeletal: Normal range of motion and neck supple.  Cardiovascular:     Rate and Rhythm: Normal rate and regular rhythm.     Heart sounds: No murmur.  Pulmonary:     Effort: Pulmonary effort is normal. No respiratory distress.     Breath sounds: No wheezing or rales.  Abdominal:     Palpations: Abdomen is soft.  Musculoskeletal: Normal range of motion.        General: No tenderness.  Lymphadenopathy:     Cervical: No  cervical adenopathy.  Skin:    General: Skin is warm and dry.     Coloration: Skin is not pale.     Findings: Rash present. No erythema.  Neurological:     General: No focal deficit present.     Mental Status: He is alert and oriented to person, place, and time.     Coordination: Coordination normal.  Psychiatric:        Mood and Affect: Mood normal.        Behavior: Behavior normal.        Thought Content: Thought content normal.        Judgment: Judgment normal.    Has multiple hyperpigmented areas where he has had MRSA infection see pictures below dated March 28, 2019:  Right leg     Right leg today 06/28/2019:        Left leg March 28, 2019:     Today 06/28/2019:      Left arm    Arms 06/28/2019:        Buttocks          Assessment & Plan:   Recurrent MRSA infections:  Continue doxycycline for 2 more months and re-evaluate him   he should definitely get vancomycin prior to surgery to reduce his risk of MRSA surgical site infection with his gastric bypass.  I do not see clear evidence for hidradenitis here  Sp right hallux arthoplazsty, hallux exciision and of ulcer with tissue flap   appears to be doing well  Pilonidal cyst: he believes he needs surgery for this

## 2019-06-29 ENCOUNTER — Ambulatory Visit (INDEPENDENT_AMBULATORY_CARE_PROVIDER_SITE_OTHER): Payer: BC Managed Care – PPO | Admitting: Podiatry

## 2019-06-29 VITALS — Temp 98.2°F

## 2019-06-29 DIAGNOSIS — Z9889 Other specified postprocedural states: Secondary | ICD-10-CM

## 2019-06-29 DIAGNOSIS — L97511 Non-pressure chronic ulcer of other part of right foot limited to breakdown of skin: Secondary | ICD-10-CM

## 2019-07-02 NOTE — Progress Notes (Signed)
Subjective:  Patient ID: Eduardo [name], male    DOB: 1971-02-03,  MRN: [id number]  Chief Complaint  Patient presents with  . Routine Post Op    " my toe is really hurting and my pain management doctor will not ghive me anymore pain medication    DOS: 06/14/2019 Procedures:             1) Right Hallux Arthroplasty             2) Right Hallux Excision of Ulcer             3) Adjacent Tissue Transfer - Single Lobe Flap             4) Excision of Interphalangeal Sesamoid Bone  48 y.o. male returns for post-op check. Per pain management doctor, we will be allowed to write him for post-op pain medication.  Review of Systems: Negative except as noted in the HPI. Denies N/V/F/Ch.  Past Medical History:  Diagnosis Date  . Anxiety   . Chronic back pain   . Degenerative disk disease   . Depression   . DVT of upper extremity (deep vein thrombosis) ([place])   . GERD (gastroesophageal reflux disease)   . Hypertension   . MRSA (methicillin resistant staph aureus) culture positive   . Peripheral neuropathy   . Pilonidal cyst   . Pulmonary embolus ([place])    no blood thinner 2002  . Sleep apnea    can't use cpap due to deviated nasal septumg    Current Outpatient Medications:  .  acetaminophen (TYLENOL) 650 MG CR tablet, Take 1,300 mg by mouth every 8 (eight) hours as needed for pain., Disp: , Rfl:  .  albuterol (PROVENTIL HFA;VENTOLIN HFA) 108 (90 Base) MCG/ACT inhaler, Inhale 2 puffs into the lungs every 6 (six) hours as needed for wheezing or shortness of breath., Disp: 1 Inhaler, Rfl: 2 .  amLODipine (NORVASC) 5 MG tablet, Take 1 tablet (5 mg total) by mouth daily., Disp: 90 tablet, Rfl: 0 .  doxycycline (VIBRA-TABS) 100 MG tablet, Take 1 tablet (100 mg total) by mouth 2 (two) times daily., Disp: 60 tablet, Rfl: 2 .  fluticasone (FLONASE) 50 MCG/ACT nasal spray, SPRAY 2 SPRAYS IN EACH NOSTRIL DAILY, Disp: 16 g, Rfl: 6 .  montelukast (SINGULAIR) 10 MG tablet, Take 1 tablet (10 mg total)  by mouth at bedtime., Disp: 90 tablet, Rfl: 3 .  Nutritional Supplements (JUICE PLUS FIBRE PO), Take 4 each by mouth daily with lunch., Disp: , Rfl:  .  oxyCODONE (ROXICODONE) 15 MG immediate release tablet, Take 1 tablet (15 mg total) by mouth every 6 (six) hours as needed for pain., Disp: 20 tablet, Rfl: 0 .  oxyCODONE-acetaminophen (PERCOCET) 7.5-325 MG tablet, Take 1 tablet by mouth every 6 (six) hours as needed., Disp: 120 tablet, Rfl: 0 .  pregabalin (LYRICA) 300 MG capsule, Take 1 capsule (300 mg total) by mouth 2 (two) times daily., Disp: 60 capsule, Rfl: 2 .  varenicline (CHANTIX) 1 MG tablet, Take 1 mg by mouth 2 (two) times daily., Disp: , Rfl:   Social History   Tobacco Use  Smoking Status Current Every Day Smoker  . Packs/day: 0.25  . Years: 33.00  . Pack years: 8.25  . Types: Cigarettes  Smokeless Tobacco Never Used  Tobacco Comment   06/28/19 1/4 pack per day    Allergies  Allergen Reactions  . Pollen Extract Cough  . Dust Mite Extract Other (See Comments)  . Other Other (  See Comments)    Ragweed    Objective:   There were no vitals filed for this visit. There is no height or weight on file to calculate BMI. Constitutional Well developed. Well nourished.  Vascular Foot warm and well perfused. Capillary refill normal to all digits.   Neurologic Normal speech. Oriented to person, place, and time. Epicritic sensation to light touch grossly present bilaterally.  Dermatologic Skin healing well without signs of infection. Skin edges well coapted without signs of infection.   Orthopedic: Tenderness to palpation noted about the surgical site.   Radiographs: None today. Assessment:   1. Post-operative state    Plan:  Patient was evaluated and treated and all questions answered.  S/p foot surgery right -Progressing as expected post-operatively. -XR: None -WB Status: WBAT in boot -Sutures: intact. -Medications: Rx for oxycodone 6mg  -Foot redressed.  Return  in about 1 week (around 06/29/2019).  Suture removal at that time

## 2019-07-02 NOTE — Progress Notes (Signed)
Subjective:  Patient ID: Eduardo [name], male    DOB: 1971/06/25,  MRN: [id number]  Chief Complaint  Patient presents with  . Routine Post Op    " my toe throbs at times but it feels ok"     DOS: 06/14/2019 Procedures:             1) Right Hallux Arthroplasty             2) Right Hallux Excision of Ulcer             3) Adjacent Tissue Transfer - Single Lobe Flap             4) Excision of Interphalangeal Sesamoid Bone  48 y.o. male returns for post-op check. Presents for concern of post-op bleeding. Reports throbbing pain, unable to get in touch with his pain management doctor but he has an appt next week.  Review of Systems: Negative except as noted in the HPI. Denies N/V/F/Ch.  Past Medical History:  Diagnosis Date  . Anxiety   . Chronic back pain   . Degenerative disk disease   . Depression   . DVT of upper extremity (deep vein thrombosis) ([place])   . GERD (gastroesophageal reflux disease)   . Hypertension   . MRSA (methicillin resistant staph aureus) culture positive   . Peripheral neuropathy   . Pilonidal cyst   . Pulmonary embolus ([place])    no blood thinner 2002  . Sleep apnea    can't use cpap due to deviated nasal septumg    Current Outpatient Medications:  .  acetaminophen (TYLENOL) 650 MG CR tablet, Take 1,300 mg by mouth every 8 (eight) hours as needed for pain., Disp: , Rfl:  .  albuterol (PROVENTIL HFA;VENTOLIN HFA) 108 (90 Base) MCG/ACT inhaler, Inhale 2 puffs into the lungs every 6 (six) hours as needed for wheezing or shortness of breath., Disp: 1 Inhaler, Rfl: 2 .  amLODipine (NORVASC) 5 MG tablet, Take 1 tablet (5 mg total) by mouth daily., Disp: 90 tablet, Rfl: 0 .  doxycycline (VIBRA-TABS) 100 MG tablet, Take 1 tablet (100 mg total) by mouth 2 (two) times daily., Disp: 60 tablet, Rfl: 2 .  fluticasone (FLONASE) 50 MCG/ACT nasal spray, SPRAY 2 SPRAYS IN EACH NOSTRIL DAILY, Disp: 16 g, Rfl: 6 .  montelukast (SINGULAIR) 10 MG tablet, Take 1 tablet (10 mg  total) by mouth at bedtime., Disp: 90 tablet, Rfl: 3 .  Nutritional Supplements (JUICE PLUS FIBRE PO), Take 4 each by mouth daily with lunch., Disp: , Rfl:  .  oxyCODONE (ROXICODONE) 15 MG immediate release tablet, Take 1 tablet (15 mg total) by mouth every 6 (six) hours as needed for pain., Disp: 20 tablet, Rfl: 0 .  oxyCODONE-acetaminophen (PERCOCET) 7.5-325 MG tablet, Take 1 tablet by mouth every 6 (six) hours as needed., Disp: 120 tablet, Rfl: 0 .  pregabalin (LYRICA) 300 MG capsule, Take 1 capsule (300 mg total) by mouth 2 (two) times daily., Disp: 60 capsule, Rfl: 2 .  varenicline (CHANTIX) 1 MG tablet, Take 1 mg by mouth 2 (two) times daily., Disp: , Rfl:   Social History   Tobacco Use  Smoking Status Current Every Day Smoker  . Packs/day: 0.25  . Years: 33.00  . Pack years: 8.25  . Types: Cigarettes  Smokeless Tobacco Never Used  Tobacco Comment   06/28/19 1/4 pack per day    Allergies  Allergen Reactions  . Pollen Extract Cough  . Dust Mite Extract Other (See Comments)  .  Other Other (See Comments)    Ragweed    Objective:   Vitals:   06/16/19 1021  Temp: 97.9 F (36.6 C)   There is no height or weight on file to calculate BMI. Constitutional Well developed. Well nourished.  Vascular Foot warm and well perfused. Capillary refill normal to all digits.   Neurologic Normal speech. Oriented to person, place, and time. Epicritic sensation to light touch grossly present bilaterally.  Dermatologic Skin healing well without signs of infection. Skin edges well coapted without signs of infection. No active bleeding.  Orthopedic: Tenderness to palpation noted about the surgical site.   Radiographs: None today. Assessment:   1. Post-operative state    Plan:  Patient was evaluated and treated and all questions answered.  S/p foot surgery right -Progressing as expected post-operatively. -XR: None -WB Status: WBAT in boot -Sutures: intact. -Medications: none today.  -Foot redressed.  No follow-ups on file.

## 2019-07-02 NOTE — Progress Notes (Signed)
Subjective:  Patient ID: Eduardo [name], male    DOB: Mar 17, 1971,  MRN: [id number]  Chief Complaint  Patient presents with  . Routine Post Op    POV#2 DOS 06/14/2019 EXC. BENIGN LESION 1.0 CM, HAMMER TOE REPAIR HALLUX, AND ADJACENT TISSUE TRANSFER RT. Pt states some concern about the appearance of the toe. Denies fever/chills/nausea/vomiting, and does not have much pain.    DOS: 06/14/2019 Procedures:             1) Right Hallux Arthroplasty             2) Right Hallux Excision of Ulcer             3) Adjacent Tissue Transfer - Single Lobe Flap             4) Excision of Interphalangeal Sesamoid Bone  48 y.o. male returns for post-op check. Per pain management doctor, we will be allowed to write him for post-op pain medication.   Review of Systems: Negative except as noted in the HPI. Denies N/V/F/Ch.  Past Medical History:  Diagnosis Date  . Anxiety   . Chronic back pain   . Degenerative disk disease   . Depression   . DVT of upper extremity (deep vein thrombosis) ([place])   . GERD (gastroesophageal reflux disease)   . Hypertension   . MRSA (methicillin resistant staph aureus) culture positive   . Peripheral neuropathy   . Pilonidal cyst   . Pulmonary embolus ([place])    no blood thinner 2002  . Sleep apnea    can't use cpap due to deviated nasal septumg    Current Outpatient Medications:  .  acetaminophen (TYLENOL) 650 MG CR tablet, Take 1,300 mg by mouth every 8 (eight) hours as needed for pain., Disp: , Rfl:  .  albuterol (PROVENTIL HFA;VENTOLIN HFA) 108 (90 Base) MCG/ACT inhaler, Inhale 2 puffs into the lungs every 6 (six) hours as needed for wheezing or shortness of breath., Disp: 1 Inhaler, Rfl: 2 .  amLODipine (NORVASC) 5 MG tablet, Take 1 tablet (5 mg total) by mouth daily., Disp: 90 tablet, Rfl: 0 .  doxycycline (VIBRA-TABS) 100 MG tablet, Take 1 tablet (100 mg total) by mouth 2 (two) times daily., Disp: 60 tablet, Rfl: 2 .  fluticasone (FLONASE) 50 MCG/ACT nasal  spray, SPRAY 2 SPRAYS IN EACH NOSTRIL DAILY, Disp: 16 g, Rfl: 6 .  montelukast (SINGULAIR) 10 MG tablet, Take 1 tablet (10 mg total) by mouth at bedtime., Disp: 90 tablet, Rfl: 3 .  Nutritional Supplements (JUICE PLUS FIBRE PO), Take 4 each by mouth daily with lunch., Disp: , Rfl:  .  oxyCODONE (ROXICODONE) 15 MG immediate release tablet, Take 1 tablet (15 mg total) by mouth every 6 (six) hours as needed for pain., Disp: 20 tablet, Rfl: 0 .  oxyCODONE-acetaminophen (PERCOCET) 7.5-325 MG tablet, Take 1 tablet by mouth every 6 (six) hours as needed., Disp: 120 tablet, Rfl: 0 .  pregabalin (LYRICA) 300 MG capsule, Take 1 capsule (300 mg total) by mouth 2 (two) times daily., Disp: 60 capsule, Rfl: 2 .  varenicline (CHANTIX) 1 MG tablet, Take 1 mg by mouth 2 (two) times daily., Disp: , Rfl:   Social History   Tobacco Use  Smoking Status Current Every Day Smoker  . Packs/day: 0.25  . Years: 33.00  . Pack years: 8.25  . Types: Cigarettes  Smokeless Tobacco Never Used  Tobacco Comment   06/28/19 1/4 pack per day    Allergies  Allergen  Reactions  . Pollen Extract Cough  . Dust Mite Extract Other (See Comments)  . Other Other (See Comments)    Ragweed    Objective:   Vitals:   06/29/19 1202  Temp: 98.2 F (36.8 C)   There is no height or weight on file to calculate BMI. Constitutional Well developed. Well nourished.  Vascular Foot warm and well perfused. Capillary refill normal to all digits.   Neurologic Normal speech. Oriented to person, place, and time. Epicritic sensation to light touch grossly present bilaterally.  Dermatologic Skin healing well without signs of infection. Skin edges well coapted without signs of infection.   Orthopedic: Tenderness to palpation noted about the surgical site.   Radiographs: None today. Assessment:   1. Post-operative state   2. 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  S/p foot surgery right -Progressing as expected post-operatively. -XR: None -WB Status: WBAT in boot -Sutures: removed. Skin well healed dorsally. Plantarly some active wound remains, but without signs of infection. Debrided to viable wound base. Covered under global.. -Medications: None today. Continue oxycodone. -Foot redressed.  Return in about 1 week (around 07/06/2019).

## 2019-07-05 ENCOUNTER — Encounter: Payer: Self-pay | Admitting: Registered Nurse

## 2019-07-05 ENCOUNTER — Other Ambulatory Visit: Payer: Self-pay

## 2019-07-05 ENCOUNTER — Encounter: Payer: BC Managed Care – PPO | Attending: Physical Medicine & Rehabilitation | Admitting: Registered Nurse

## 2019-07-05 VITALS — BP 126/85 | HR 72 | Temp 98.5°F | Ht 76.0 in | Wt 352.2 lb

## 2019-07-05 DIAGNOSIS — M79671 Pain in right foot: Secondary | ICD-10-CM | POA: Insufficient documentation

## 2019-07-05 DIAGNOSIS — G608 Other hereditary and idiopathic neuropathies: Secondary | ICD-10-CM | POA: Diagnosis not present

## 2019-07-05 DIAGNOSIS — M21611 Bunion of right foot: Secondary | ICD-10-CM | POA: Insufficient documentation

## 2019-07-05 DIAGNOSIS — G894 Chronic pain syndrome: Secondary | ICD-10-CM | POA: Insufficient documentation

## 2019-07-05 DIAGNOSIS — M79672 Pain in left foot: Secondary | ICD-10-CM | POA: Insufficient documentation

## 2019-07-05 DIAGNOSIS — Z5181 Encounter for therapeutic drug level monitoring: Secondary | ICD-10-CM | POA: Diagnosis not present

## 2019-07-05 DIAGNOSIS — M21612 Bunion of left foot: Secondary | ICD-10-CM | POA: Insufficient documentation

## 2019-07-05 MED ORDER — OXYCODONE-ACETAMINOPHEN 7.5-325 MG PO TABS: 1.0000 | ORAL_TABLET | Freq: Four times a day (QID) | ORAL | 0 refills | Status: DC | PRN

## 2019-07-05 NOTE — Progress Notes (Signed)
Subjective:    Patient ID: Eduardo [name], male    DOB: 05-11-1971, 48 y.o.   MRN: [id number]  HPI: Eduardo [name] is a 48 y.o. male who returns for follow up appointment for chronic pain and medication refill. He states his pain is located in his lower back radiating into his bilateral lower extremities and bilateral feet. He rates his pain 9. His current exercise regime is walking and performing stretching exercises.  Mr. Thwing Morphine equivalent is 45.00  MME.  UDS ordered today.  Dr. March Rummage prescribed Oxycodone 15 mg  On 06/22/2019, post surgery #5 tablets, he underwent  By Dr. March Rummage.  EXCISION BENIGN LESION RIGHT FOOT Right General  HALLUX ARTHROPLASTY RIGHT FOOT WITH TISSUE TRANSER EXCISION OF SESAMOID BONE      Pain Inventory Average Pain 9 Pain Right Now 9 My pain is sharp, burning, dull, stabbing and tingling  In the last 24 hours, has pain interfered with the following? General activity 9 Relation with others 6 Enjoyment of life 10 What TIME of day is your pain at its worst? all day Sleep (in general) Poor  Pain is worse with: walking, bending, sitting and standing Pain improves with: rest and medication Relief from Meds: 7  Mobility walk without assistance how many minutes can you walk? 5 ability to climb steps?  yes do you drive?  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  yes x-rays  Physicians involved in your care Any changes since last visit?  yes Primary care . Orthopedist .   Family History  Problem Relation Age of Onset  . Cancer Maternal Grandmother        breast, ovarian & colon  . Hypertension Mother   . Diabetes Mother   . Hypertension Father   . Heart disease Father   . Stroke Paternal Grandfather    . Neuropathy Neg Hx    Social History   Socioeconomic History  . Marital status: Divorced    Spouse name: Not on file  . Number of children: 2  . Years of education: 12th   . Highest education level: Not on file  Occupational History  . Occupation: [name]  Social Needs  . Financial resource strain: Not on file  . Food insecurity    Worry: Not on file    Inability: Not on file  . Transportation needs    Medical: Not on file    Non-medical: Not on file  Tobacco Use  . Smoking status: Current Every Day Smoker    Packs/day: 0.25    Years: 33.00    Pack years: 8.25    Types: Cigarettes  . Smokeless tobacco: Never Used  . Tobacco comment: 06/28/19 1/4 pack per day  Substance and Sexual Activity  . Alcohol use: No    Alcohol/week: 0.0 standard drinks    Comment: Rarely  . Drug use: No  . Sexual activity: Yes    Birth control/protection: None  Lifestyle  . Physical activity    Days per week: Not on file    Minutes per session: Not on file  . Stress: Not on file  Relationships  . Social Herbalist on phone: Not on file    Gets together: Not on file    Attends religious service: Not on file  Active member of club or organization: Not on file    Attends meetings of clubs or organizations: Not on file    Relationship status: Not on file  Other Topics Concern  . Not on file  Social History Narrative   Lives at home w/ his step-mother   Right-handed   Drinks about 2-3 Mt Dews a day   Past Surgical History:  Procedure Laterality Date  . Irrigation and debridement of back abscess    . MASS EXCISION Right 06/14/2019   Procedure: EXCISION BENIGN LESION RIGHT FOOT;  Surgeon: [name], DPM;  [facility];  Service: Podiatry;  Laterality: Right;  . NASAL SEPTOPLASTY W/ TURBINOPLASTY [name] 02/03/2018   Procedure: NASAL SEPTOPLASTY WITH TURBINATE REDUCTION;  Surgeon: [name], MD;  Location: [place];  Service: ENT;  Laterality: [name];  . PILONIDAL CYST DRAINAGE  [name] 04/10/2017   Procedure: IRRIGATION AND DEBRIDEMENT BACK ABSCESS;  Surgeon: [name], MD;  [facility];  Service: General;  Laterality: [name];  . SINUS ENDO WITH FUSION [name] 02/03/2018   Procedure: ENDOSCOPIC SINUS SURGERY WITH FUSION;  Surgeon: [name], MD;  Location: [place];  Service: ENT;  Laterality: [name];  . THORACIC OUTLET SURGERY  2000  . TOE ARTHROPLASTY Right 06/14/2019   Procedure: HALLUX ARTHROPLASTY RIGHT FOOT WITH TISSUE TRANSER EXCISION OF SESAMOID BONE;  Surgeon: [name], DPM;  [facility];  Service: Podiatry;  Laterality: Right;  . WRIST FUSION  2002   Past Medical History:  Diagnosis Date  . Anxiety   . Chronic back pain   . Degenerative disk disease   . Depression   . DVT of upper extremity (deep vein thrombosis) ([place])   . GERD (gastroesophageal reflux disease)   . Hypertension   . MRSA (methicillin resistant staph aureus) culture positive   . Peripheral neuropathy   . Pilonidal cyst   . Pulmonary embolus ([place])    no blood thinner 2002  . Sleep apnea    can't use cpap due to deviated nasal septumg   BP 126/85   Pulse 72   Temp 98.5 F (36.9 C)   Ht 6\' 4"  (1.93 m)   Wt (!) 352 lb 3.2 oz (159.8 kg)   SpO2 94%   BMI 42.87 kg/m   Opioid Risk Score:   Fall Risk Score:  `1  Depression screen PHQ 2/9  Depression screen [facility] 2/9 06/28/2019 05/15/2019 04/25/2019 03/29/2019 03/28/2019 03/07/2019 03/02/2019  Decreased Interest 0 1 0 0 0 2 0  Down, Depressed, Hopeless 0 1 0 0 0 1 0  PHQ - 2 Score 0 2 0 0 0 3 0  Altered sleeping - 3 - - - 3 -  Tired, decreased energy - 2 - - - 3 -  Change in appetite - 1 - - - 2 -  Feeling bad or failure about yourself  - 0 - - - 0 -  Trouble concentrating - 2 - - - 1 -  Moving slowly or fidgety/restless - 0 - - - - -  Suicidal thoughts - 0 - - - 0 -  PHQ-9 Score - 10 - - - 12 -  Difficult doing work/chores - Somewhat difficult - - - Very difficult -  Some recent data might be hidden    Review of Systems   Constitutional: Positive for unexpected weight change.  HENT: Negative.   Eyes: Negative.   Respiratory: Positive for apnea and shortness of breath.   Cardiovascular: Negative.   Gastrointestinal:  Negative.   Endocrine: Negative.   Musculoskeletal: Positive for back pain, gait problem and joint swelling.  Skin: Negative.   Allergic/Immunologic: Negative.   Neurological: Positive for weakness and numbness.  Hematological: Negative.   Psychiatric/Behavioral: Positive for confusion and dysphoric mood. The patient is nervous/anxious.   All other systems reviewed and are negative.      Objective:   Physical Exam Vitals signs and nursing note reviewed.  Constitutional:      Appearance: Normal appearance.  Neck:     Musculoskeletal: Normal range of motion and neck supple.  Cardiovascular:     Rate and Rhythm: Normal rate and regular rhythm.     Pulses: Normal pulses.     Heart sounds: Normal heart sounds.  Pulmonary:     Effort: Pulmonary effort is normal.     Breath sounds: Normal breath sounds.  Musculoskeletal:     Comments: Normal Muscle Bulk and Muscle Testing Reveals:  Upper Extremities: Full ROM and Muscle Strength 5/5 Lumbar Hypersensitivity Lower Extremities: Full ROM and Muscle Strength 5/5 Right Lower Extremity Flexion Produces Pain into Right Foot.  He's wearingRight  Post Op Shoe Arises from Table Slowly Antalgic Gait   Skin:    General: Skin is warm and dry.  Neurological:     Mental Status: He is alert and oriented to person, place, and time.  Psychiatric:        Mood and Affect: Mood normal.        Behavior: Behavior normal.           Assessment & Plan:  Chronic idiopathic axonal polyneuropathy: Continue Lyrica.07/05/2019. 2. Posterior tibial tendinitis of left lower extremity: Continue HEP as Tolerated. Continue to Monitor.07/05/2019 3. Lumbar DDD/ Lumbar Radiculitis: Continue current medication regime and HEP as Tolerated.07/05/2019 4. Chronic  Pain Syndrome:Refilled: Oxycodone7.5/325mg  one tablet4 times daily as needed for pain. PerDr, Kirsteins note he'sunwilling to go any higher given his history in terms of total morphine equivalent dose.07/05/2019 We will continue the opioid monitoring program, this consists of regular clinic visits, examinations, urine drug screen, pill counts as well as use of [place] Controlled Substance Reporting system.  F/U in1 month

## 2019-07-06 ENCOUNTER — Ambulatory Visit (INDEPENDENT_AMBULATORY_CARE_PROVIDER_SITE_OTHER): Payer: Self-pay | Admitting: Podiatry

## 2019-07-06 DIAGNOSIS — Z9889 Other specified postprocedural states: Secondary | ICD-10-CM

## 2019-07-08 LAB — TOXASSURE SELECT,+ANTIDEPR,UR

## 2019-07-11 ENCOUNTER — Telehealth: Payer: Self-pay | Admitting: *Deleted

## 2019-07-11 NOTE — Telephone Encounter (Signed)
Urine drug screen for this encounter is consistent for prescribed medication 

## 2019-07-13 ENCOUNTER — Other Ambulatory Visit: Payer: BC Managed Care – PPO

## 2019-07-14 ENCOUNTER — Other Ambulatory Visit: Payer: Self-pay

## 2019-07-14 ENCOUNTER — Ambulatory Visit (INDEPENDENT_AMBULATORY_CARE_PROVIDER_SITE_OTHER): Payer: BC Managed Care – PPO | Admitting: Podiatry

## 2019-07-14 VITALS — Temp 97.5°F

## 2019-07-14 DIAGNOSIS — Z9889 Other specified postprocedural states: Secondary | ICD-10-CM

## 2019-07-17 NOTE — Progress Notes (Signed)
Subjective:  Patient ID: Eduardo [name], male    DOB: 1971/03/12,  MRN: [id number]  Chief Complaint  Patient presents with  . Wound Check    Pt states right 1st digit doesn't have much pain but is concerned about the amount of swelling. Pt denies any drainage and denies fever/nausea/vomiting/chills.    DOS: 06/14/2019 Procedures:             1) Right Hallux Arthroplasty             2) Right Hallux Excision of Ulcer             3) Adjacent Tissue Transfer - Single Lobe Flap             4) Excision of Interphalangeal Sesamoid Bone  48 y.o. male returns for post-op check. Still having a lot of swelling but pain resolved.  Review of Systems: Negative except as noted in the HPI. Denies N/V/F/Ch.  Past Medical History:  Diagnosis Date  . Anxiety   . Chronic back pain   . Degenerative disk disease   . Depression   . DVT of upper extremity (deep vein thrombosis) ([place])   . GERD (gastroesophageal reflux disease)   . Hypertension   . MRSA (methicillin resistant staph aureus) culture positive   . Peripheral neuropathy   . Pilonidal cyst   . Pulmonary embolus ([place])    no blood thinner 2002  . Sleep apnea    can't use cpap due to deviated nasal septumg    Current Outpatient Medications:  .  acetaminophen (TYLENOL) 650 MG CR tablet, Take 1,300 mg by mouth every 8 (eight) hours as needed for pain., Disp: , Rfl:  .  albuterol (PROVENTIL HFA;VENTOLIN HFA) 108 (90 Base) MCG/ACT inhaler, Inhale 2 puffs into the lungs every 6 (six) hours as needed for wheezing or shortness of breath., Disp: 1 Inhaler, Rfl: 2 .  amLODipine (NORVASC) 5 MG tablet, Take 1 tablet (5 mg total) by mouth daily., Disp: 90 tablet, Rfl: 0 .  doxycycline (VIBRA-TABS) 100 MG tablet, Take 1 tablet (100 mg total) by mouth 2 (two) times daily., Disp: 60 tablet, Rfl: 2 .  fluticasone (FLONASE) 50 MCG/ACT nasal spray, SPRAY 2 SPRAYS IN EACH NOSTRIL DAILY, Disp: 16 g, Rfl: 6 .  montelukast (SINGULAIR) 10 MG tablet, Take 1  tablet (10 mg total) by mouth at bedtime., Disp: 90 tablet, Rfl: 3 .  Nutritional Supplements (JUICE PLUS FIBRE PO), Take 4 each by mouth daily with lunch., Disp: , Rfl:  .  oxyCODONE-acetaminophen (PERCOCET) 7.5-325 MG tablet, Take 1 tablet by mouth every 6 (six) hours as needed., Disp: 120 tablet, Rfl: 0 .  pregabalin (LYRICA) 300 MG capsule, Take 1 capsule (300 mg total) by mouth 2 (two) times daily., Disp: 60 capsule, Rfl: 2 .  varenicline (CHANTIX) 1 MG tablet, Take 1 mg by mouth 2 (two) times daily., Disp: , Rfl:   Social History   Tobacco Use  Smoking Status Current Every Day Smoker  . Packs/day: 0.25  . Years: 33.00  . Pack years: 8.25  . Types: Cigarettes  Smokeless Tobacco Never Used  Tobacco Comment   06/28/19 1/4 pack per day    Allergies  Allergen Reactions  . Pollen Extract Cough  . Dust Mite Extract Other (See Comments)  . Other Other (See Comments)    Ragweed    Objective:   Vitals:   07/14/19 1223  Temp: (!) 97.5 F (36.4 C)   There is no height or  weight on file to calculate BMI. Constitutional Well developed. Well nourished.  Vascular Foot warm and well perfused. Capillary refill normal to all digits.   Neurologic Normal speech. Oriented to person, place, and time. Epicritic sensation to light touch grossly present bilaterally.  Dermatologic Skin well healed. Toe edematous  Orthopedic: No tenderness to palpation noted about the surgical site. Hallux IPJ ROM appreciated.   Radiographs: None today. Assessment:   1. Post-operative state    Plan:  Patient was evaluated and treated and all questions answered.  S/p foot surgery right -Progressing as expected post-operatively. -XR: None -WB Status: WBAT in boot. -Sutures: out -Medications: None today -Coban applied for edema reduction. Pt educated on use. Discussed not doing it too tight ot avoid vascular compromise. -Foot redressed.  No follow-ups on file.

## 2019-07-27 ENCOUNTER — Ambulatory Visit (INDEPENDENT_AMBULATORY_CARE_PROVIDER_SITE_OTHER): Payer: BC Managed Care – PPO | Admitting: Podiatry

## 2019-07-27 ENCOUNTER — Other Ambulatory Visit: Payer: Self-pay

## 2019-07-27 ENCOUNTER — Other Ambulatory Visit: Payer: Self-pay | Admitting: Podiatry

## 2019-07-27 ENCOUNTER — Ambulatory Visit (INDEPENDENT_AMBULATORY_CARE_PROVIDER_SITE_OTHER): Payer: BC Managed Care – PPO

## 2019-07-27 DIAGNOSIS — M19079 Primary osteoarthritis, unspecified ankle and foot: Secondary | ICD-10-CM

## 2019-07-27 DIAGNOSIS — M2041 Other hammer toe(s) (acquired), right foot: Secondary | ICD-10-CM

## 2019-08-02 NOTE — Progress Notes (Signed)
Subjective:  Patient ID: Eduardo [name], male    DOB: Sep 19, 1971,  MRN: [id number]  No chief complaint on file.   DOS: 06/14/2019 Procedures:             1) Right Hallux Arthroplasty             2) Right Hallux Excision of Ulcer             3) Adjacent Tissue Transfer - Single Lobe Flap             4) Excision of Interphalangeal Sesamoid Bone  48 y.o. male returns for post-op check.  Patient states that he slipped in the shower has some burning pain in the wound.  States that occurred 4 days ago.    Review of Systems: Negative except as noted in the HPI. Denies N/V/F/Ch.  Past Medical History:  Diagnosis Date  . Anxiety   . Chronic back pain   . Degenerative disk disease   . Depression   . DVT of upper extremity (deep vein thrombosis) ([place])   . GERD (gastroesophageal reflux disease)   . Hypertension   . MRSA (methicillin resistant staph aureus) culture positive   . Peripheral neuropathy   . Pilonidal cyst   . Pulmonary embolus ([place])    no blood thinner 2002  . Sleep apnea    can't use cpap due to deviated nasal septumg    Current Outpatient Medications:  .  acetaminophen (TYLENOL) 650 MG CR tablet, Take 1,300 mg by mouth every 8 (eight) hours as needed for pain., Disp: , Rfl:  .  albuterol (PROVENTIL HFA;VENTOLIN HFA) 108 (90 Base) MCG/ACT inhaler, Inhale 2 puffs into the lungs every 6 (six) hours as needed for wheezing or shortness of breath., Disp: 1 Inhaler, Rfl: 2 .  amLODipine (NORVASC) 5 MG tablet, Take 1 tablet (5 mg total) by mouth daily., Disp: 90 tablet, Rfl: 0 .  doxycycline (VIBRA-TABS) 100 MG tablet, Take 1 tablet (100 mg total) by mouth 2 (two) times daily., Disp: 60 tablet, Rfl: 2 .  fluticasone (FLONASE) 50 MCG/ACT nasal spray, SPRAY 2 SPRAYS IN EACH NOSTRIL DAILY, Disp: 16 g, Rfl: 6 .  montelukast (SINGULAIR) 10 MG tablet, Take 1 tablet (10 mg total) by mouth at bedtime., Disp: 90 tablet, Rfl: 3 .  Nutritional Supplements (JUICE PLUS FIBRE PO), Take 4  each by mouth daily with lunch., Disp: , Rfl:  .  oxyCODONE-acetaminophen (PERCOCET) 7.5-325 MG tablet, Take 1 tablet by mouth every 6 (six) hours as needed., Disp: 120 tablet, Rfl: 0 .  pregabalin (LYRICA) 300 MG capsule, Take 1 capsule (300 mg total) by mouth 2 (two) times daily., Disp: 60 capsule, Rfl: 2 .  varenicline (CHANTIX) 1 MG tablet, Take 1 mg by mouth 2 (two) times daily., Disp: , Rfl:   Social History   Tobacco Use  Smoking Status Current Every Day Smoker  . Packs/day: 0.25  . Years: 33.00  . Pack years: 8.25  . Types: Cigarettes  Smokeless Tobacco Never Used  Tobacco Comment   06/28/19 1/4 pack per day    Allergies  Allergen Reactions  . Pollen Extract Cough  . Dust Mite Extract Other (See Comments)  . Other Other (See Comments)    Ragweed    Objective:   There were no vitals filed for this visit. There is no height or weight on file to calculate BMI. Constitutional Well developed. Well nourished.  Vascular Foot warm and well perfused. Capillary refill normal to all  digits.   Neurologic Normal speech. Oriented to person, place, and time. Epicritic sensation to light touch grossly present bilaterally.  Dermatologic Skin healing well without signs of infection. Skin edges well coapted without signs of infection. Small plantar wound without signs of infection. Edema right hallux.  Orthopedic: Tenderness to palpation noted about the surgical site.   Radiographs: None today. Assessment:   No diagnosis found. Plan:  Patient was evaluated and treated and all questions answered.  S/p foot surgery right -Progressing as expected post-operatively. -XR: None -WB Status: WBAT in boot -Sutures: removed. Skin well healed dorsally. Still with small wound plantarly but improving. -Medications: None today. Continue oxycodone. -Foot redressed.  Return in about 1 week (around 07/13/2019) for Post-op, Wound Care.

## 2019-08-09 ENCOUNTER — Other Ambulatory Visit: Payer: Self-pay

## 2019-08-09 ENCOUNTER — Encounter: Payer: BC Managed Care – PPO | Attending: Physical Medicine & Rehabilitation | Admitting: Registered Nurse

## 2019-08-09 ENCOUNTER — Encounter: Payer: Self-pay | Admitting: Registered Nurse

## 2019-08-09 VITALS — BP 146/76 | HR 88 | Temp 97.7°F | Ht 76.0 in | Wt 346.0 lb

## 2019-08-09 DIAGNOSIS — M79671 Pain in right foot: Secondary | ICD-10-CM | POA: Insufficient documentation

## 2019-08-09 DIAGNOSIS — Z5181 Encounter for therapeutic drug level monitoring: Secondary | ICD-10-CM

## 2019-08-09 DIAGNOSIS — E538 Deficiency of other specified B group vitamins: Secondary | ICD-10-CM

## 2019-08-09 DIAGNOSIS — M25572 Pain in left ankle and joints of left foot: Secondary | ICD-10-CM

## 2019-08-09 DIAGNOSIS — Z79899 Other long term (current) drug therapy: Secondary | ICD-10-CM

## 2019-08-09 DIAGNOSIS — G894 Chronic pain syndrome: Secondary | ICD-10-CM | POA: Insufficient documentation

## 2019-08-09 DIAGNOSIS — M21611 Bunion of right foot: Secondary | ICD-10-CM | POA: Insufficient documentation

## 2019-08-09 DIAGNOSIS — G8929 Other chronic pain: Secondary | ICD-10-CM

## 2019-08-09 DIAGNOSIS — M21612 Bunion of left foot: Secondary | ICD-10-CM | POA: Insufficient documentation

## 2019-08-09 DIAGNOSIS — G609 Hereditary and idiopathic neuropathy, unspecified: Secondary | ICD-10-CM | POA: Diagnosis not present

## 2019-08-09 DIAGNOSIS — M25571 Pain in right ankle and joints of right foot: Secondary | ICD-10-CM

## 2019-08-09 DIAGNOSIS — M5416 Radiculopathy, lumbar region: Secondary | ICD-10-CM | POA: Diagnosis not present

## 2019-08-09 DIAGNOSIS — M25562 Pain in left knee: Secondary | ICD-10-CM

## 2019-08-09 DIAGNOSIS — M5136 Other intervertebral disc degeneration, lumbar region: Secondary | ICD-10-CM

## 2019-08-09 DIAGNOSIS — M79672 Pain in left foot: Secondary | ICD-10-CM | POA: Diagnosis not present

## 2019-08-09 DIAGNOSIS — G608 Other hereditary and idiopathic neuropathies: Secondary | ICD-10-CM | POA: Insufficient documentation

## 2019-08-09 MED ORDER — OXYCODONE-ACETAMINOPHEN 7.5-325 MG PO TABS: 1.0000 | ORAL_TABLET | Freq: Four times a day (QID) | ORAL | 0 refills | Status: DC | PRN

## 2019-08-09 MED ORDER — PREGABALIN 300 MG PO CAPS: 300.0000 mg | ORAL_CAPSULE | Freq: Two times a day (BID) | ORAL | 2 refills | Status: DC

## 2019-08-09 NOTE — Progress Notes (Signed)
Subjective:    Patient ID: Eduardo [name], male    DOB: September 09, 1971, 48 y.o.   MRN: [id number]  HPI: Eduardo [name] is a 48 y.o. male who returns for follow up appointment for chronic pain and medication refill. He states his pain is located in his lower back pain radiating into his bilateral lower extremities and bilateral feet. Also reports left knee pain and bilateral ankle pain. He rates his pain 7. His current exercise regime is walking and performing stretching exercises.  [name] is 45.00 MME.  Last UDS was Performed on 07/05/2019, it was consistent.   [name] regarding his medication, we have discussed his medications for the last two months with Dr. Letta Pate response. He states he would like to see Dr. Letta Pate next month. His next appointment will be with Dr. Letta Pate.    Pain Inventory Average Pain 9 Pain Right Now 7 My pain is sharp, burning, stabbing, tingling and aching  In the last 24 hours, has pain interfered with the following? General activity 7 Relation with others 7 Enjoyment of life 10 What TIME of day is your pain at its worst? all Sleep (in general) Poor  Pain is worse with: walking, bending, sitting, standing and some activites Pain improves with: rest and medication Relief from Meds: 7  Mobility walk without assistance how many minutes can you walk? 5 ability to climb steps?  yes do you drive?  yes Do you have any goals in this area?  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  yes  Neuro/Psych weakness numbness tingling trouble walking spasms anxiety  Prior Studies Any changes since last visit?  no  Physicians involved in your care Any changes since last visit?  no   Family History  Problem Relation Age of Onset  . Cancer Maternal Grandmother        breast, ovarian & colon   . Hypertension Mother   . Diabetes Mother   . Hypertension Father   . Heart disease Father   . Stroke Paternal Grandfather   . Neuropathy Neg Hx    Social History   Socioeconomic History  . Marital status: Divorced    Spouse name: Not on file  . Number of children: 2  . Years of education: 12th   . Highest education level: Not on file  Occupational History  . Occupation: [name]  Social Needs  . Financial resource strain: Not on file  . Food insecurity    Worry: Not on file    Inability: Not on file  . Transportation needs    Medical: Not on file    Non-medical: Not on file  Tobacco Use  . Smoking status: Current Every Day Smoker    Packs/day: 0.25    Years: 33.00    Pack years: 8.25    Types: Cigarettes  . Smokeless tobacco: Never Used  . Tobacco comment: 06/28/19 1/4 pack per day  Substance and Sexual Activity  . Alcohol use: No    Alcohol/week: 0.0 standard drinks    Comment: Rarely  . Drug use: No  . Sexual activity: Yes    Birth control/protection: None  Lifestyle  . Physical activity    Days per week: Not on file    Minutes per session: Not on file  . Stress: Not on file  Relationships  . Social connections  Talks on phone: Not on file    Gets together: Not on file    Attends religious service: Not on file    Active member of club or organization: Not on file    Attends meetings of clubs or organizations: Not on file    Relationship status: Not on file  Other Topics Concern  . Not on file  Social History Narrative   Lives at home w/ his step-mother   Right-handed   Drinks about 2-3 Mt Dews a day   Past Surgical History:  Procedure Laterality Date  . Irrigation and debridement of back abscess    . MASS EXCISION Right 06/14/2019   Procedure: EXCISION BENIGN LESION RIGHT FOOT;  Surgeon: [name], DPM;  [facility];  Service: Podiatry;  Laterality: Right;  . NASAL SEPTOPLASTY W/ TURBINOPLASTY [name] 02/03/2018   Procedure: NASAL SEPTOPLASTY  WITH TURBINATE REDUCTION;  Surgeon: [name], MD;  Location: [place];  Service: ENT;  Laterality: [name];  . PILONIDAL CYST DRAINAGE [name] 04/10/2017   Procedure: IRRIGATION AND DEBRIDEMENT BACK ABSCESS;  Surgeon: [name], MD;  [facility];  Service: General;  Laterality: [name];  . SINUS ENDO WITH FUSION [name] 02/03/2018   Procedure: ENDOSCOPIC SINUS SURGERY WITH FUSION;  Surgeon: [name], MD;  Location: [place];  Service: ENT;  Laterality: [name];  . THORACIC OUTLET SURGERY  2000  . TOE ARTHROPLASTY Right 06/14/2019   Procedure: HALLUX ARTHROPLASTY RIGHT FOOT WITH TISSUE TRANSER EXCISION OF SESAMOID BONE;  Surgeon: [name], DPM;  [facility];  Service: Podiatry;  Laterality: Right;  . WRIST FUSION  2002   Past Medical History:  Diagnosis Date  . Anxiety   . Chronic back pain   . Degenerative disk disease   . Depression   . DVT of upper extremity (deep vein thrombosis) ([place])   . GERD (gastroesophageal reflux disease)   . Hypertension   . MRSA (methicillin resistant staph aureus) culture positive   . Peripheral neuropathy   . Pilonidal cyst   . Pulmonary embolus ([place])    no blood thinner 2002  . Sleep apnea    can't use cpap due to deviated nasal septumg   BP (!) 146/76   Pulse 88   Temp 97.7 F (36.5 C)   Ht 6\' 4"  (1.93 m)   Wt (!) 346 lb (156.9 kg)   SpO2 95%   BMI 42.12 kg/m   Opioid Risk Score:   Fall Risk Score:  `1  Depression screen PHQ 2/9  Depression screen [facility] 2/9 06/28/2019 05/15/2019 04/25/2019 03/29/2019 03/28/2019 03/07/2019 03/02/2019  Decreased Interest 0 1 0 0 0 2 0  Down, Depressed, Hopeless 0 1 0 0 0 1 0  PHQ - 2 Score 0 2 0 0 0 3 0  Altered sleeping - 3 - - - 3 -  Tired, decreased energy - 2 - - - 3 -  Change in appetite - 1 - - - 2 -  Feeling bad or failure about yourself  - 0 - - - 0 -  Trouble concentrating - 2 - - - 1 -  Moving slowly or fidgety/restless - 0 - - - - -  Suicidal thoughts - 0 - - - 0 -  PHQ-9 Score - 10 - - - 12 -   Difficult doing work/chores - Somewhat difficult - - - Very difficult -  Some recent data might be hidden    Review of Systems  Constitutional: Negative.   HENT:  Negative.   Eyes: Negative.   Respiratory: Positive for apnea and shortness of breath.   Cardiovascular: Negative.   Gastrointestinal: Negative.   Endocrine: Negative.   Genitourinary: Negative.   Musculoskeletal: Positive for back pain and gait problem.       Spasms   Skin: Negative.   Allergic/Immunologic: Negative.   Neurological: Positive for weakness and numbness.       Tingling  Hematological: Negative.   Psychiatric/Behavioral: Negative.   All other systems reviewed and are negative.      Objective:   Physical Exam Vitals signs and nursing note reviewed.  Constitutional:      Appearance: Normal appearance.  Neck:     Musculoskeletal: Normal range of motion and neck supple.  Cardiovascular:     Rate and Rhythm: Normal rate and regular rhythm.     Pulses: Normal pulses.     Heart sounds: Normal heart sounds.  Pulmonary:     Effort: Pulmonary effort is normal.     Breath sounds: Normal breath sounds.  Musculoskeletal:     Comments: Normal Muscle Bulk and Muscle Testing Reveals:  Upper Extremities: Full ROM and Muscle Strength 5/5  Lumbar Paraspinal Tenderness: L-3-L-5 Lower Extremities: Full ROM and Muscle Strength 5/5 Bilateral Lower Extremity Flexion produces Pain into Bilateral Ankles Arises from Table with ease Narrow Based  Gait   Skin:    General: Skin is warm and dry.  Neurological:     Mental Status: He is alert and oriented to person, place, and time.  Psychiatric:        Mood and Affect: Mood normal.        Behavior: Behavior normal.           Assessment & Plan:  Chronic idiopathic axonal polyneuropathy: Continue Lyrica.08/09/2019. 2. Posterior tibial tendinitis of left lower extremity: Continue HEP as Tolerated. Continue to Monitor.08/09/2019 3. Lumbar DDD/ Lumbar Radiculitis:  Continue current medication regime and HEP as Tolerated.08/09/2019 4. Chronic Pain Syndrome:Refilled: Oxycodone7.5/325mg  one tablet4 times daily as needed for pain. PerDr, Kirsteins note he'sunwilling to go any higher given his history in terms of total morphine equivalent dose.08/09/2019 We will continue the opioid monitoring program, this consists of regular clinic visits, examinations, urine drug screen, pill counts as well as use of [place] Controlled Substance Reporting system.  F/U in1 month

## 2019-08-17 ENCOUNTER — Telehealth: Payer: Self-pay | Admitting: Pulmonary Disease

## 2019-08-17 DIAGNOSIS — G4733 Obstructive sleep apnea (adult) (pediatric): Secondary | ICD-10-CM

## 2019-08-17 NOTE — Telephone Encounter (Signed)
Spoke with patient, he is ok with doing a split night study.

## 2019-08-17 NOTE — Telephone Encounter (Signed)
Patient was scheduled to see Eduardo [name] tomorrow 08/18/19 for a 3 month follow up after his cpap titration. Reviewed patient's chart. Patient was scheduled for a titration back in August but he had to cancel due to having surgery. He was supposed to call back but never did. He has not used his cpap machine since 2019.   Spoke with patient. He is interested in getting the cpap titration now. Advised him that I would cancel his appt and ask the PCCs to get him scheduled. He verbalized understanding.   PCCs, can you all get him scheduled for a cpap titration? Patient stated that he is available for testing.

## 2019-08-17 NOTE — Telephone Encounter (Signed)
I will call & get him rescheduled.

## 2019-08-17 NOTE — Telephone Encounter (Signed)
Since patient has not used his cpap in 1 yr, he will need to have a split night study. Left message for patient to explain that the type of test will change.   Will go ahead and place for split night study.   Per Aaron Edelman, patient will need a follow up with RA afterwards.

## 2019-08-17 NOTE — Telephone Encounter (Signed)
Pt has been scheduled for covid test 11/9 and titration study on 11/12.  Gave pt appt info & mailed packet.  Nothing further needed.

## 2019-08-17 NOTE — Addendum Note (Signed)
Addended by: Valerie Salts on: 08/17/2019 02:53 PM   Modules accepted: Orders

## 2019-08-18 ENCOUNTER — Other Ambulatory Visit: Payer: Self-pay | Admitting: Podiatry

## 2019-08-18 ENCOUNTER — Other Ambulatory Visit: Payer: Self-pay

## 2019-08-18 ENCOUNTER — Ambulatory Visit (INDEPENDENT_AMBULATORY_CARE_PROVIDER_SITE_OTHER): Payer: BC Managed Care – PPO

## 2019-08-18 ENCOUNTER — Ambulatory Visit (INDEPENDENT_AMBULATORY_CARE_PROVIDER_SITE_OTHER): Payer: BC Managed Care – PPO | Admitting: Podiatry

## 2019-08-18 ENCOUNTER — Ambulatory Visit: Payer: BC Managed Care – PPO | Admitting: Pulmonary Disease

## 2019-08-18 DIAGNOSIS — M19079 Primary osteoarthritis, unspecified ankle and foot: Secondary | ICD-10-CM

## 2019-08-18 DIAGNOSIS — Z9889 Other specified postprocedural states: Secondary | ICD-10-CM

## 2019-08-18 DIAGNOSIS — M19071 Primary osteoarthritis, right ankle and foot: Secondary | ICD-10-CM

## 2019-08-18 MED ORDER — METHYLPREDNISOLONE 4 MG PO TBPK: ORAL_TABLET | ORAL | 0 refills | Status: DC

## 2019-08-19 NOTE — Progress Notes (Signed)
Subjective:  Patient ID: Eduardo [name], male    DOB: [birth year],  MRN: [id number]  Chief Complaint  Patient presents with  . Hammer Toe    DOS 06/14/2019 EXC. BENIGN LESION 1.0 CM, HAMMER TOE REPAIR HALLUX AND ADJACENT TISSUE TRANSFER. Pt states right 1st digit is very swollen still, pt is concerned new wounds are appearing on the dorsal and interdigital aspects of his right 1st digit.    DOS: 06/14/2019 Procedures:             1) Right Hallux Arthroplasty             2) Right Hallux Excision of Ulcer             3) Adjacent Tissue Transfer - Single Lobe Flap             4) Excision of Interphalangeal Sesamoid Bone  48 y.o. male returns for post-op check. Still having a lot of swelling but pain resolved.  Review of Systems: Negative except as noted in the HPI. Denies N/V/F/Ch.  Past Medical History:  Diagnosis Date  . Anxiety   . Chronic back pain   . Degenerative disk disease   . Depression   . DVT of upper extremity (deep vein thrombosis) ([place])   . GERD (gastroesophageal reflux disease)   . Hypertension   . MRSA (methicillin resistant staph aureus) culture positive   . Peripheral neuropathy   . Pilonidal cyst   . Pulmonary embolus ([place])    no blood thinner 2002  . Sleep apnea    can't use cpap due to deviated nasal septumg    Current Outpatient Medications:  .  acetaminophen (TYLENOL) 650 MG CR tablet, Take 1,300 mg by mouth every 8 (eight) hours as needed for pain., Disp: , Rfl:  .  albuterol (PROVENTIL HFA;VENTOLIN HFA) 108 (90 Base) MCG/ACT inhaler, Inhale 2 puffs into the lungs every 6 (six) hours as needed for wheezing or shortness of breath., Disp: 1 Inhaler, Rfl: 2 .  amLODipine (NORVASC) 5 MG tablet, Take 1 tablet (5 mg total) by mouth daily., Disp: 90 tablet, Rfl: 0 .  doxycycline (VIBRA-TABS) 100 MG tablet, Take 1 tablet (100 mg total) by mouth 2 (two) times daily., Disp: 60 tablet, Rfl: 2 .  fluticasone (FLONASE) 50 MCG/ACT nasal spray, SPRAY 2 SPRAYS IN  EACH NOSTRIL DAILY, Disp: 16 g, Rfl: 6 .  hydrochlorothiazide (HYDRODIURIL) 25 MG tablet, , Disp: , Rfl:  .  montelukast (SINGULAIR) 10 MG tablet, Take 1 tablet (10 mg total) by mouth at bedtime., Disp: 90 tablet, Rfl: 3 .  mupirocin ointment (BACTROBAN) 2 %, , Disp: , Rfl:  .  Nutritional Supplements (JUICE PLUS FIBRE PO), Take 4 each by mouth daily with lunch., Disp: , Rfl:  .  oxyCODONE-acetaminophen (PERCOCET) 7.5-325 MG tablet, Take 1 tablet by mouth every 6 (six) hours as needed., Disp: 120 tablet, Rfl: 0 .  pregabalin (LYRICA) 300 MG capsule, Take 1 capsule (300 mg total) by mouth 2 (two) times daily., Disp: 60 capsule, Rfl: 2 .  varenicline (CHANTIX) 1 MG tablet, Take 1 mg by mouth 2 (two) times daily., Disp: , Rfl:  .  methylPREDNISolone (MEDROL DOSEPAK) 4 MG TBPK tablet, 6 Day Taper Pack. Take as Directed., Disp: 21 tablet, Rfl: 0  Social History   Tobacco Use  Smoking Status Current Every Day Smoker  . Packs/day: 0.25  . Years: 33.00  . Pack years: 8.25  . Types: Cigarettes  Smokeless Tobacco Never Used  Tobacco  Comment   06/28/19 1/4 pack per day    Allergies  Allergen Reactions  . Pollen Extract Cough  . Dust Mite Extract Other (See Comments)  . Other Other (See Comments)    Ragweed    Objective:   There were no vitals filed for this visit. There is no height or weight on file to calculate BMI. Constitutional Well developed. Well nourished.  Vascular Foot warm and well perfused. Capillary refill normal to all digits.   Neurologic Normal speech. Oriented to person, place, and time. Epicritic sensation to light touch grossly present bilaterally.  Dermatologic Skin well healed. Toe edematous.  Small recurrence of ulceration at the plantar medial aspect of the digit.  Small dorsal abrasions  Orthopedic: No tenderness to palpation noted about the surgical site. Hallux IPJ ROM appreciated.   Radiographs: None today. Assessment:   1. Post-operative state    Plan:   Patient was evaluated and treated and all questions answered.  S/p foot surgery right -X-rays taken and reviewed still with successful arthroplasty of the digit.  Tiny loose fragments on both sides of the joint.  I do not contributing to his edema.  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  Continue edema reduction wrapping with Coban.  Rx for Medrol pack to see if this can help to decrease edema. -Dorsal wounds likely from edema and rubbing with his shoes.  Should subside with edema reduction  Return in about 2 weeks (around 09/01/2019) for Post-op.

## 2019-08-21 ENCOUNTER — Telehealth: Payer: Self-pay | Admitting: Adult Health

## 2019-08-21 NOTE — Telephone Encounter (Signed)
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  ----Patient ask for Tonya/ CMA advised she was in room w/patient (He ask for her to call him back @ [phone] / reason not disclosed.)  --glh

## 2019-08-22 ENCOUNTER — Encounter: Payer: BC Managed Care – PPO | Admitting: Adult Health

## 2019-08-22 ENCOUNTER — Telehealth: Payer: Self-pay | Admitting: Podiatry

## 2019-08-22 NOTE — Telephone Encounter (Signed)
Called and spoke with Max at [facility]. I told her we received the medical records request and that there is a 7 - 10 business day turn around.

## 2019-08-22 NOTE — Telephone Encounter (Signed)
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  Advised pt that we only do a routine urinalysis for DOT PEs.  Charyl Bigger, CMA

## 2019-08-22 NOTE — Telephone Encounter (Signed)
This is [employer] calling to see if you received the fax for medical records we sent on 08/18/2019. Please call us and reference order number [phone].

## 2019-08-23 ENCOUNTER — Telehealth: Payer: Self-pay | Admitting: *Deleted

## 2019-08-23 NOTE — Telephone Encounter (Signed)
I told pt he should remain in the surgical shoe until seen in 2 weeks.

## 2019-08-23 NOTE — Telephone Encounter (Signed)
Pt states he is out-of-work and wearing the surgical shoe and he wanted to know how long he needed to continue wearing.

## 2019-08-28 ENCOUNTER — Other Ambulatory Visit (HOSPITAL_COMMUNITY)
Admission: RE | Admit: 2019-08-28 | Discharge: 2019-08-28 | Disposition: A | Discharge status: Home / Self Care | Payer: BC Managed Care – PPO | Source: Ambulatory Visit | Attending: Pulmonary Disease | Admitting: Pulmonary Disease

## 2019-08-28 ENCOUNTER — Other Ambulatory Visit: Payer: Self-pay

## 2019-08-28 ENCOUNTER — Ambulatory Visit (INDEPENDENT_AMBULATORY_CARE_PROVIDER_SITE_OTHER): Payer: BC Managed Care – PPO | Admitting: Infectious Disease

## 2019-08-28 VITALS — Wt 349.0 lb

## 2019-08-28 DIAGNOSIS — Z20828 Contact with and (suspected) exposure to other viral communicable diseases: Secondary | ICD-10-CM | POA: Diagnosis not present

## 2019-08-28 DIAGNOSIS — L739 Follicular disorder, unspecified: Secondary | ICD-10-CM

## 2019-08-28 DIAGNOSIS — Z01812 Encounter for preprocedural laboratory examination: Secondary | ICD-10-CM | POA: Diagnosis not present

## 2019-08-28 DIAGNOSIS — Z8614 Personal history of Methicillin resistant Staphylococcus aureus infection: Secondary | ICD-10-CM | POA: Diagnosis not present

## 2019-08-28 DIAGNOSIS — L0591 Pilonidal cyst without abscess: Secondary | ICD-10-CM

## 2019-08-28 DIAGNOSIS — I872 Venous insufficiency (chronic) (peripheral): Secondary | ICD-10-CM

## 2019-08-28 DIAGNOSIS — B999 Unspecified infectious disease: Secondary | ICD-10-CM | POA: Diagnosis not present

## 2019-08-28 MED ORDER — SULFAMETHOXAZOLE-TRIMETHOPRIM 800-160 MG PO TABS: 1.0000 | ORAL_TABLET | Freq: Two times a day (BID) | ORAL | 2 refills | Status: DC

## 2019-08-28 NOTE — Progress Notes (Signed)
Subjective:     Patient ID: Eduardo [name], male    DOB: 1970-11-06, 48 y.o.   MRN: [id number] HPI  [name] is a 48 year old man who suffers from hypertension obesity and recurrent MRSA infections.  He tells me that he has been suffering from these since around the year 2000.  He attributes some of this to problems with a pilonidal cyst that he developed for which she has not yet had surgery.  He has had outbreaks throughout on legs buttocks particular in the area where he has the pilonidal cyst, also on arms.  He recently completed doxycycline for a flare of blepharitis.  He does not appear to have areas in the armpits affected he states occasionally will have areas in the legs but not necessarily the groin.  He has tried decolonization with Hibiclens in the past but not bleach baths.  We discussed the idea of bleach baths but he believes he cannot get in and out of the bathtub and it will not fit in his current bathtub.  Since I last saw him we gave him a several month course of doxycyline and he believed his was having improvement.  He has recently had surgery with podiatry but and says he is having some swelling at the site but did not want to take the dressing down to let me look at it.  He has follow-up with Dr. March Rummage this Friday.  He is still having outbreaks of folliculitis and shows me a new area that he seems to have scratched open on his arm which is bleeding.  He continues to be particularly concerned about the pilonidal cyst that he has.  I suggested seeing if high-dose oral Bactrim might make a difference in his condition and we will proceed with this. Past Medical History:  Diagnosis Date  . Anxiety   . Chronic back pain   . Degenerative disk disease   . Depression   . DVT of upper extremity (deep vein thrombosis) ([place])   . GERD (gastroesophageal reflux disease)   . Hypertension   . MRSA (methicillin resistant staph aureus) culture positive   .  Peripheral neuropathy   . Pilonidal cyst   . Pulmonary embolus ([place])    no blood thinner 2002  . Sleep apnea    can't use cpap due to deviated nasal septumg    Past Surgical History:  Procedure Laterality Date  . Irrigation and debridement of back abscess    . MASS EXCISION Right 06/14/2019   Procedure: EXCISION BENIGN LESION RIGHT FOOT;  Surgeon: [name], DPM;  Location: [facility];  Service: Podiatry;  Laterality: Right;  . NASAL SEPTOPLASTY W/ TURBINOPLASTY [name] 02/03/2018   Procedure: NASAL SEPTOPLASTY WITH TURBINATE REDUCTION;  Surgeon: [name], MD;  Location: [facility];  Service: ENT;  Laterality: [name];  . PILONIDAL CYST DRAINAGE [name] 04/10/2017   Procedure: IRRIGATION AND DEBRIDEMENT BACK ABSCESS;  Surgeon: [name], MD;  Location: [facility];  Service: General;  Laterality: [name];  . SINUS ENDO WITH FUSION [name] 02/03/2018   Procedure: ENDOSCOPIC SINUS SURGERY WITH FUSION;  Surgeon: [name], MD;  Location: [facility];  Service: ENT;  Laterality: [name];  . THORACIC OUTLET SURGERY  2000  . TOE ARTHROPLASTY Right 06/14/2019   Procedure: HALLUX ARTHROPLASTY RIGHT FOOT WITH TISSUE TRANSER EXCISION OF SESAMOID BONE;  Surgeon: [name], DPM;  Location: [facility];  Service: Podiatry;  Laterality: Right;  . WRIST FUSION  2002    Family History  Problem Relation Age of Onset  . Cancer Maternal Grandmother        breast, ovarian & colon  . Hypertension Mother   . Diabetes Mother   . Hypertension Father   . Heart disease Father   . Stroke Paternal Grandfather   . Neuropathy Neg Hx       Social History   Socioeconomic History  . Marital status: Divorced    Spouse name: Not on file  . Number of children: 2  . Years of education: 12th   . Highest education level: Not on file  Occupational History  . Occupation: [name]  Social Needs  . Financial resource strain: Not on file  . Food insecurity    Worry: Not on file    Inability: Not on file  . Transportation needs    Medical:  Not on file    Non-medical: Not on file  Tobacco Use  . Smoking status: Current Every Day Smoker    Packs/day: 0.25    Years: 33.00    Pack years: 8.25    Types: Cigarettes  . Smokeless tobacco: Never Used  . Tobacco comment: 06/28/19 1/4 pack per day  Substance and Sexual Activity  . Alcohol use: No    Alcohol/week: 0.0 standard drinks    Comment: Rarely  . Drug use: No  . Sexual activity: Yes    Birth control/protection: None  Lifestyle  . Physical activity    Days per week: Not on file    Minutes per session: Not on file  . Stress: Not on file  Relationships  . Social Herbalist on phone: Not on file    Gets together: Not on file    Attends religious service: Not on file    Active member of club or organization: Not on file    Attends meetings of clubs or organizations: Not on file    Relationship status: Not on file  Other Topics Concern  . Not on file  Social History Narrative   Lives at home w/ his step-mother   Right-handed   Drinks about 2-3 Mt Dews a day    Allergies  Allergen Reactions  . Pollen Extract Cough  . Dust Mite Extract Other (See Comments)  . Other Other (See Comments)    Ragweed      Current Outpatient Medications:  .  acetaminophen (TYLENOL) 650 MG CR tablet, Take 1,300 mg by mouth every 8 (eight) hours as needed for pain., Disp: , Rfl:  .  albuterol (PROVENTIL HFA;VENTOLIN HFA) 108 (90 Base) MCG/ACT inhaler, Inhale 2 puffs into the lungs every 6 (six) hours as needed for wheezing or shortness of breath., Disp: 1 Inhaler, Rfl: 2 .  amLODipine (NORVASC) 5 MG tablet, Take 1 tablet (5 mg total) by mouth daily., Disp: 90 tablet, Rfl: 0 .  doxycycline (VIBRA-TABS) 100 MG tablet, Take 1 tablet (100 mg total) by mouth 2 (two) times daily., Disp: 60 tablet, Rfl: 2 .  fluticasone (FLONASE) 50 MCG/ACT nasal spray, SPRAY 2 SPRAYS IN EACH NOSTRIL DAILY, Disp: 16 g, Rfl: 6 .  hydrochlorothiazide (HYDRODIURIL) 25 MG tablet, , Disp: , Rfl:  .   methylPREDNISolone (MEDROL DOSEPAK) 4 MG TBPK tablet, 6 Day Taper Pack. Take as Directed., Disp: 21 tablet, Rfl: 0 .  montelukast (SINGULAIR) 10 MG tablet, Take 1 tablet (10 mg total) by mouth at bedtime., Disp: 90 tablet, Rfl: 3 .  mupirocin ointment (BACTROBAN) 2 %, , Disp: , Rfl:  .  Nutritional  Supplements (JUICE PLUS FIBRE PO), Take 4 each by mouth daily with lunch., Disp: , Rfl:  .  oxyCODONE-acetaminophen (PERCOCET) 7.5-325 MG tablet, Take 1 tablet by mouth every 6 (six) hours as needed., Disp: 120 tablet, Rfl: 0 .  pregabalin (LYRICA) 300 MG capsule, Take 1 capsule (300 mg total) by mouth 2 (two) times daily., Disp: 60 capsule, Rfl: 2 .  varenicline (CHANTIX) 1 MG tablet, Take 1 mg by mouth 2 (two) times daily., Disp: , Rfl:      Review of Systems  Constitutional: Negative for chills and fever.  HENT: Negative for congestion, dental problem, drooling, ear discharge, rhinorrhea and sore throat.   Eyes: Negative for photophobia.  Respiratory: Negative for apnea, cough, choking, chest tightness, shortness of breath and wheezing.   Cardiovascular: Negative for chest pain, palpitations and leg swelling.  Gastrointestinal: Negative for abdominal pain, blood in stool, constipation, diarrhea, nausea and vomiting.  Genitourinary: Negative for dysuria, flank pain and hematuria.  Musculoskeletal: Negative for back pain and myalgias.  Skin: Positive for color change, rash and wound.  Neurological: Negative for dizziness, weakness and headaches.  Hematological: Does not bruise/bleed easily.  Psychiatric/Behavioral: Negative for agitation, behavioral problems, confusion and suicidal ideas. The patient is nervous/anxious.        Objective:   Physical Exam Constitutional:      General: He is not in acute distress.    Appearance: He is not diaphoretic.  HENT:     Head: Normocephalic and atraumatic.     Right Ear: External ear normal.     Left Ear: External ear normal.     Nose: Nose  normal.     Mouth/Throat:     Pharynx: No oropharyngeal exudate.  Eyes:     General: No scleral icterus.    Conjunctiva/sclera: Conjunctivae normal.  Neck:     Musculoskeletal: Normal range of motion and neck supple.  Cardiovascular:     Rate and Rhythm: Normal rate and regular rhythm.     Heart sounds: No murmur.  Pulmonary:     Effort: Pulmonary effort is normal. No respiratory distress.     Breath sounds: No wheezing or rales.  Abdominal:     Palpations: Abdomen is soft.  Musculoskeletal: Normal range of motion.        General: No tenderness.  Lymphadenopathy:     Cervical: No cervical adenopathy.  Skin:    General: Skin is warm and dry.     Coloration: Skin is not pale.     Findings: Rash present. No erythema.  Neurological:     General: No focal deficit present.     Mental Status: He is alert and oriented to person, place, and time.     Coordination: Coordination normal.  Psychiatric:        Mood and Affect: Mood normal.        Behavior: Behavior normal.        Thought Content: Thought content normal.        Judgment: Judgment normal.    Has multiple hyperpigmented areas where he has had MRSA infection see pictures below dated March 28, 2019:  Right leg     Right leg today 06/28/2019:        Left leg March 28, 2019:      06/28/2019:      Left arm    Arms 06/28/2019:        Buttocks     Left upper arm 08/28/2019:  Assessment & Plan:   Recurrent MRSA infections:  I am going to change him over to double strength Bactrim 2 double strength twice daily.  I worry that some of this pathogenesis is due to him scratching these lesions.     Sp right hallux arthoplazsty, hallux exciision and of ulcer with tissue flap  Had appear to be doing well but now is complaining of pain at the site but did not want to let me look at it  Pilonidal cyst: he believes he needs surgery for this.  I told him there is my opinion elective surgeries  would soon be being shut down so that if he did want to pursue this I would recommend him doing it sooner rather than later  For flu vaccination I recommended flu vaccine for him

## 2019-08-29 LAB — NOVEL CORONAVIRUS, NAA (HOSP ORDER, SEND-OUT TO REF LAB; TAT 18-24 HRS): SARS-CoV-2, NAA: NOT DETECTED

## 2019-08-31 ENCOUNTER — Ambulatory Visit (HOSPITAL_BASED_OUTPATIENT_CLINIC_OR_DEPARTMENT_OTHER): Payer: BC Managed Care – PPO | Attending: Pulmonary Disease | Admitting: Pulmonary Disease

## 2019-08-31 ENCOUNTER — Other Ambulatory Visit: Payer: Self-pay

## 2019-08-31 ENCOUNTER — Encounter (HOSPITAL_BASED_OUTPATIENT_CLINIC_OR_DEPARTMENT_OTHER): Payer: BC Managed Care – PPO | Admitting: Pulmonary Disease

## 2019-08-31 DIAGNOSIS — G4733 Obstructive sleep apnea (adult) (pediatric): Secondary | ICD-10-CM

## 2019-09-01 ENCOUNTER — Encounter: Payer: Self-pay | Admitting: Podiatry

## 2019-09-01 ENCOUNTER — Ambulatory Visit (INDEPENDENT_AMBULATORY_CARE_PROVIDER_SITE_OTHER): Payer: BC Managed Care – PPO | Admitting: Podiatry

## 2019-09-01 DIAGNOSIS — Z9889 Other specified postprocedural states: Secondary | ICD-10-CM

## 2019-09-04 ENCOUNTER — Telehealth: Payer: Self-pay | Admitting: Orthopedic Surgery

## 2019-09-04 NOTE — Telephone Encounter (Signed)
Patient called needing copy of records. I advised him need to sign records release form. He stated he would try to come by office today. He is aware it would few days turn around time.

## 2019-09-05 ENCOUNTER — Telehealth: Payer: Self-pay | Admitting: *Deleted

## 2019-09-05 DIAGNOSIS — R609 Edema, unspecified: Secondary | ICD-10-CM

## 2019-09-05 DIAGNOSIS — Z9889 Other specified postprocedural states: Secondary | ICD-10-CM

## 2019-09-05 NOTE — Telephone Encounter (Signed)
Hand delivered PT rx to [facility].

## 2019-09-06 ENCOUNTER — Other Ambulatory Visit: Payer: BC Managed Care – PPO

## 2019-09-06 ENCOUNTER — Other Ambulatory Visit: Payer: Self-pay

## 2019-09-06 DIAGNOSIS — I872 Venous insufficiency (chronic) (peripheral): Secondary | ICD-10-CM

## 2019-09-06 DIAGNOSIS — L739 Follicular disorder, unspecified: Secondary | ICD-10-CM

## 2019-09-06 DIAGNOSIS — L0591 Pilonidal cyst without abscess: Secondary | ICD-10-CM

## 2019-09-06 DIAGNOSIS — B999 Unspecified infectious disease: Secondary | ICD-10-CM

## 2019-09-06 DIAGNOSIS — Z8614 Personal history of Methicillin resistant Staphylococcus aureus infection: Secondary | ICD-10-CM

## 2019-09-06 LAB — BASIC METABOLIC PANEL WITH GFR
BUN: 14 mg/dL (ref 7–25)
CO2: 27 mmol/L (ref 20–32)
Calcium: 9.1 mg/dL (ref 8.6–10.3)
Chloride: 104 mmol/L (ref 98–110)
Creat: 1.15 mg/dL (ref 0.70–1.33)
GFR, Est African American: 86 mL/min/{1.73_m2} (ref >=60)
GFR, Est Non African American: 74 mL/min/{1.73_m2} (ref >=60)
Glucose, Bld: 125 mg/dL — ABNORMAL HIGH (ref 65–99)
Potassium: 4.3 mmol/L (ref 3.5–5.3)
Sodium: 138 mmol/L (ref 135–146)

## 2019-09-08 ENCOUNTER — Encounter
Payer: BC Managed Care – PPO | Attending: Physical Medicine & Rehabilitation | Admitting: Physical Medicine and Rehabilitation

## 2019-09-08 ENCOUNTER — Other Ambulatory Visit: Payer: Self-pay

## 2019-09-08 ENCOUNTER — Other Ambulatory Visit: Payer: Self-pay | Admitting: Adult Health

## 2019-09-08 ENCOUNTER — Encounter: Payer: BC Managed Care – PPO | Admitting: Physical Medicine & Rehabilitation

## 2019-09-08 ENCOUNTER — Encounter: Payer: Self-pay | Admitting: Physical Medicine and Rehabilitation

## 2019-09-08 VITALS — BP 133/78 | HR 71 | Temp 97.7°F | Ht 76.0 in | Wt 350.0 lb

## 2019-09-08 DIAGNOSIS — M79671 Pain in right foot: Secondary | ICD-10-CM | POA: Diagnosis not present

## 2019-09-08 DIAGNOSIS — M79672 Pain in left foot: Secondary | ICD-10-CM | POA: Insufficient documentation

## 2019-09-08 DIAGNOSIS — G608 Other hereditary and idiopathic neuropathies: Secondary | ICD-10-CM | POA: Insufficient documentation

## 2019-09-08 DIAGNOSIS — M21611 Bunion of right foot: Secondary | ICD-10-CM | POA: Insufficient documentation

## 2019-09-08 DIAGNOSIS — G894 Chronic pain syndrome: Secondary | ICD-10-CM | POA: Insufficient documentation

## 2019-09-08 DIAGNOSIS — Z6841 Body Mass Index (BMI) 40.0 and over, adult: Secondary | ICD-10-CM | POA: Diagnosis not present

## 2019-09-08 DIAGNOSIS — M21612 Bunion of left foot: Secondary | ICD-10-CM | POA: Insufficient documentation

## 2019-09-08 MED ORDER — OXYCODONE-ACETAMINOPHEN 7.5-325 MG PO TABS: 1.0000 | ORAL_TABLET | Freq: Four times a day (QID) | ORAL | 0 refills | Status: DC | PRN

## 2019-09-08 MED ORDER — AMITRIPTYLINE HCL 150 MG PO TABS: 75.0000 mg | ORAL_TABLET | Freq: Every day | ORAL | 0 refills | Status: DC

## 2019-09-08 NOTE — Progress Notes (Signed)
Subjective:    Patient ID: Eduardo [name], male    DOB: 01/17/1971, 48 y.o.   MRN: [id number]  HPI [name] presents for follow up of his chronic knee pain and sciatica. He would like to get bilateral knee surgery and has been deemed a good surgical candidate. He is currently unable to work (he works in Office manager and driving) due to recent foot surgery and does not have the money to pay for knee surgery at this time but hopes to as soon as he can get back to work. He has been taking Percocet for pain control but has developed tolerance to it and it is only controlling his pain for 12 hours per day. He would like a higher dose.   He has also been taking Lyrica for sciatic pain and this helps him a lot. He takes 300mg  BID and would like a higher dose of this as well.   He is doing PT following foot surgery but has been unable to walk much because of his chronic pain issues. He is concerned about his weight and has been trying to eat healthy--he likes chicken, broccoli, and fruits. He does feel that he eats too much salt in the form of deli meats and canned beans. He recently got off of HCTZ and only takes amlodipine for blood pressure.   He would like to get a gastric bypass for weight loss. Says it will cost him $800 out of pocket so he is saving up for this.  He asks whether CBD oil could be helpful for his pain.   Pain Inventory Average Pain 9 Pain Right Now 7 My pain is constant, sharp, stabbing, tingling and aching  In the last 24 hours, has pain interfered with the following? General activity 8 Relation with others 8 Enjoyment of life 10 What TIME of day is your pain at its worst? all Sleep (in general) Poor  Pain is worse with: walking, bending, sitting and standing Pain improves with: rest and medication Relief from Meds: 3  Mobility walk without assistance how many minutes can you walk? 5 ability to climb steps?  yes do you drive?  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  no  Physicians involved in your care Any changes since last visit?  no   Family History  Problem Relation Age of Onset  . Cancer Maternal Grandmother        breast, ovarian & colon  . Hypertension Mother   . Diabetes Mother   . Hypertension Father   . Heart disease Father   . Stroke Paternal Grandfather   . Neuropathy Neg Hx    Social History   Socioeconomic History  . Marital status: Divorced    Spouse name: Not on file  . Number of children: 2  . Years of education: 12th   . Highest education level: Not on file  Occupational History  . Occupation: [name]  Social Needs  . Financial resource strain: Not on file  . Food insecurity    Worry: Not on file    Inability: Not on file  . Transportation needs    Medical: Not on file    Non-medical: Not on file  Tobacco Use  . Smoking status: Current Every Day Smoker    Packs/day: 0.25    Years: 33.00  Pack years: 8.25    Types: Cigarettes  . Smokeless tobacco: Never Used  . Tobacco comment: 06/28/19 1/4 pack per day  Substance and Sexual Activity  . Alcohol use: No    Alcohol/week: 0.0 standard drinks    Comment: Rarely  . Drug use: No  . Sexual activity: Yes    Birth control/protection: None  Lifestyle  . Physical activity    Days per week: Not on file    Minutes per session: Not on file  . Stress: Not on file  Relationships  . Social Herbalist on phone: Not on file    Gets together: Not on file    Attends religious service: Not on file    Active member of club or organization: Not on file    Attends meetings of clubs or organizations: Not on file    Relationship status: Not on file  Other Topics Concern  . Not on file  Social History Narrative   Lives at home w/ his step-mother    Right-handed   Drinks about 2-3 Mt Dews a day   Past Surgical History:  Procedure Laterality Date  . Irrigation and debridement of back abscess    . MASS EXCISION Right 06/14/2019   Procedure: EXCISION BENIGN LESION RIGHT FOOT;  Surgeon: [name], DPM;  Location: [facility];  Service: Podiatry;  Laterality: Right;  . NASAL SEPTOPLASTY W/ TURBINOPLASTY [name] 02/03/2018   Procedure: NASAL SEPTOPLASTY WITH TURBINATE REDUCTION;  Surgeon: [name], MD;  Location: [facility];  Service: ENT;  Laterality: [name];  . PILONIDAL CYST DRAINAGE [name] 04/10/2017   Procedure: IRRIGATION AND DEBRIDEMENT BACK ABSCESS;  Surgeon: [name], MD;  Location: [facility];  Service: General;  Laterality: [name];  . SINUS ENDO WITH FUSION [name] 02/03/2018   Procedure: ENDOSCOPIC SINUS SURGERY WITH FUSION;  Surgeon: [name], MD;  Location: [facility];  Service: ENT;  Laterality: [name];  . THORACIC OUTLET SURGERY  2000  . TOE ARTHROPLASTY Right 06/14/2019   Procedure: HALLUX ARTHROPLASTY RIGHT FOOT WITH TISSUE TRANSER EXCISION OF SESAMOID BONE;  Surgeon: [name], DPM;  Location: [facility];  Service: Podiatry;  Laterality: Right;  . WRIST FUSION  2002   Past Medical History:  Diagnosis Date  . Anxiety   . Chronic back pain   . Degenerative disk disease   . Depression   . DVT of upper extremity (deep vein thrombosis) ([facility])   . GERD (gastroesophageal reflux disease)   . Hypertension   . MRSA (methicillin resistant staph aureus) culture positive   . Peripheral neuropathy   . Pilonidal cyst   . Pulmonary embolus ([facility])    no blood thinner 2002  . Sleep apnea    can't use cpap due to deviated nasal septumg   BP 133/78   Pulse 71   Temp 97.7 F (36.5 C)   Ht 6\' 4"  (1.93 m)   Wt (!) 350 lb (158.8 kg)   SpO2 98%   BMI 42.60 kg/m   Opioid Risk Score:   Fall Risk Score:  `1  Depression screen PHQ 2/9  Depression screen [facility] 2/9 08/28/2019 06/28/2019 05/15/2019 04/25/2019 03/29/2019 03/28/2019 03/07/2019  Decreased Interest  0 0 1 0 0 0 2  Down, Depressed, Hopeless 1 0 1 0 0 0 1  PHQ - 2 Score 1 0 2 0 0 0 3  Altered sleeping - - 3 - - - 3  Tired, decreased energy - - 2 - - - 3  Change in appetite - - 1 - - - 2  Feeling bad or failure about yourself  - - 0 - - - 0  Trouble concentrating - - 2 - - - 1  Moving slowly or fidgety/restless - - 0 - - - -  Suicidal thoughts - - 0 - - - 0  PHQ-9 Score - - 10 - - - 12  Difficult doing work/chores - - Somewhat difficult - - - Very difficult  Some recent data might be hidden    Review of Systems  Constitutional: Negative.   HENT: Negative.   Eyes: Negative.   Respiratory: Negative.   Cardiovascular: Negative.   Gastrointestinal: Negative.   Endocrine: Negative.   Genitourinary: Negative.   Musculoskeletal: Positive for arthralgias and back pain.       Spasms  Skin: Negative.   Allergic/Immunologic: Negative.   Neurological: Positive for tremors, weakness and numbness.       Tingling   Hematological: Negative.   Psychiatric/Behavioral: The patient is nervous/anxious.   All other systems reviewed and are negative.      Objective:   Physical Exam  Gen: depressed, normal appearing HEENT: oral mucosa pink and moist, NCAT Cardio: Reg rate Chest: normal effort, normal rate of breathing Abd: soft, non-distended Ext: no edema Skin: intact Neuro/Musculoskeletal: AOx3. Normal gait. Moves all extremities antigravity.   Psych: pleasant, normal affect      Assessment & Plan:  Mr. Calleros is a 48 year old man with chronic pain who is here for refill of his chronic pain medications.  --His bilateral knee and ankle pain continues to be severe and he is developing tolerance to his Percocet. I educated him that this is to be expected and that Percocet is not recommended long term for chronic pain as the body develops tolerance to it and chronic pain can worsen as a result. He understood. I told him I will refill his prescription but not increase his dose, and  he was agreeable. He plans to have knee surgery once he has recovered from ankle surgery and is able to return to work and thus afford knee surgery. I discussed that we could try to find alternative, less addictive agents in the meantime to provide him a better quality of life and for better pain control. He asks about CBD oil, and I recommend this as an adjuvant.   --He is unable to exercise beyond his ankle PT due to his pain. He knows that his obesity is a large component of his pain due to the excess weight it puts on his joints. He wants to lose weight and pursue a gastric bypass once he can afford this. He has made significant strides in his diet and I commended him for this. As a result he was able to get off HCTZ and his blood pressure is controlled with 5mg  of amlodipine.  --Lyrica is controlling his neuropathic pain well. I told him we can not increase the dose further since he is already at the max dose of 600mg  daily. I also advised that Lyrica can cause weight gain and may be a reason he is having difficulty losing weight despite his improved diet.   --He is also having insomnia exacerbated by the need to urinate every 1-1.5 hours. He has had his prostate checked and was told it is not enlarged. He is not on any medications that should increase urinary frequency. He also reports depression and anxiety. I recommended amitriptyline at night to help with depression,  insomnia, and neuropathic pain. It also has the side effect of urinary retention which could be helpful in decreasing Mr. Coye urinary frequency.  --He was thankful for the time spent with him and positive regarding our treatment plan. I would like to see him again in 4 weeks to assess progress with the Amitriptyline. Ultimately our goal will be to improve his quality of life, help him return to work, help him to return to walking, get gastric bypass surgery and bilateral knee surgery, aid in weight loss and help him to sleep  better, and help to treat his depression and anxiety. We will utilize the biopsychosocial model to help achieve these goals.

## 2019-09-11 ENCOUNTER — Telehealth: Payer: Self-pay | Admitting: Orthopedic Surgery

## 2019-09-11 NOTE — Telephone Encounter (Signed)
IC, lmvm, advised copy of records are ready to be picked up.

## 2019-09-15 ENCOUNTER — Telehealth: Payer: Self-pay | Admitting: Pulmonary Disease

## 2019-09-15 DIAGNOSIS — G4733 Obstructive sleep apnea (adult) (pediatric): Secondary | ICD-10-CM

## 2019-09-15 NOTE — Telephone Encounter (Signed)
09/15/2019 1142  Please contact the patient and let them know that we have received his sleep study results.  They are listed below:  IMPRESSIONS - Severe obstructive sleep apnea with an AHI of 99.1 and SpO2 low of 66%. - He did well with CPAP 20 cm H2O. - He did not require supplemental oxygen during this study.  DIAGNOSIS - Obstructive Sleep Apnea (327.23 [G47.33 ICD-10])  RECOMMENDATIONS - Trial of CPAP therapy on 20 cm H2O with a Large size Fisher&Paykel Full Face Mask F&P Vitera (new) mask and heated humidification.  We recommend that we go ahead and get the patient restarted on CPAP therapy.  CPAP set pressure of 20 is recommended based off of his split-night sleep study.  Also based off his split-night sleep study recommend a Large size Fisher&Paykel Full Face Mask F&P Vitera (new) mask and heated humidification.  Please also schedule the patient is a 77-month follow-up to show 30-day compliance to CPAP therapy with Dr. Elsworth Soho.  Wyn Quaker, FNP

## 2019-09-15 NOTE — Procedures (Signed)
    Patient Name: Eduardo [name], Eduardo [name] Date: 08/31/2019 Gender: Male D.O.B: 08/21/1971 Age (years): 44 Referring Provider: Lauraine Rinne NP Height (inches): 32 Interpreting Physician: Chesley Mires MD, ABSM Weight (lbs): 350 RPSGT: Laren Everts BMI: 43 MRN: [id number] Neck Size: 23.00  CLINICAL INFORMATION Sleep Study Type: Split Night CPAP  Indication for sleep study: Excessive Daytime Sleepiness, Fatigue, Obesity, OSA, Re-Evaluation, Sleep walking/talking/parasomnias, Snoring, Witnessed Apneas  Epworth Sleepiness Score: 22  SLEEP STUDY TECHNIQUE As per the AASM Manual for the Scoring of Sleep and Associated Events v2.3 (April 2016) with a hypopnea requiring 4% desaturations.  The channels recorded and monitored were frontal, central and occipital EEG, electrooculogram (EOG), submentalis EMG (chin), nasal and oral airflow, thoracic and abdominal wall motion, anterior tibialis EMG, snore microphone, electrocardiogram, and pulse oximetry. Continuous positive airway pressure (CPAP) was initiated when the patient met split night criteria and was titrated according to treat sleep-disordered breathing.  MEDICATIONS Medications self-administered by patient taken the night of the study : [name]  RESPIRATORY PARAMETERS Diagnostic  Total AHI (/hr): 99.1 RDI (/hr): 99.5 OA Index (/hr): 64.4 CA Index (/hr): 6.7 REM AHI (/hr): 103.4 NREM AHI (/hr): 98.0 Supine AHI (/hr): 101.1 Non-supine AHI (/hr): 97.5 Min O2 Sat (%): 66.0 Mean O2 (%): 86.5 Time below 88% (min): 89.4   Titration  Optimal Pressure (cm): 20 AHI at Optimal Pressure (/hr): 1.2 Min O2 at Optimal Pressure (%): 86.0 Supine % at Optimal (%): 100 Sleep % at Optimal (%): 94   SLEEP ARCHITECTURE The recording time for the entire night was 408.3 minutes.  During a baseline period of 171.0 minutes, the patient slept for 151.9 minutes in REM and nonREM, yielding a sleep efficiency of 88.9%%. Sleep onset after lights out was  1.5 minutes with a REM latency of 123.5 minutes. The patient spent 19.4%% of the night in stage N1 sleep, 59.2%% in stage N2 sleep, 0.0%% in stage N3 and 21.4% in REM.  During the titration period of 231.4 minutes, the patient slept for 220.0 minutes in REM and nonREM, yielding a sleep efficiency of 95.1%%. Sleep onset after CPAP initiation was 3.6 minutes with a REM latency of 48.0 minutes. The patient spent 0.7%% of the night in stage N1 sleep, 55.2%% in stage N2 sleep, 0.0%% in stage N3 and 44.1% in REM.  CARDIAC DATA The 2 lead EKG demonstrated sinus rhythm. The mean heart rate was 100.0 beats per minute. Other EKG findings include: None.  LEG MOVEMENT DATA The total Periodic Limb Movements of Sleep (PLMS) were 0. The PLMS index was 0.0 .  IMPRESSIONS - Severe obstructive sleep apnea with an AHI of 99.1 and SpO2 low of 66%. - He did well with CPAP 20 cm H2O. - He did not require supplemental oxygen during this study.  DIAGNOSIS - Obstructive Sleep Apnea (327.23 [G47.33 ICD-10])  RECOMMENDATIONS - Trial of CPAP therapy on 20 cm H2O with a Large size Fisher&Paykel Full Face Mask F&P Vitera (new) mask and heated humidification.  Chesley Mires, MD, [place], [place] Board of Sleep Medicine NPI: [id number] 09/15/2019, 9:48 AM

## 2019-09-19 NOTE — Telephone Encounter (Signed)
Spoke with patient. He is aware of results and verbalized understanding. He stated that he already has a cpap machine through [facility] but wanted to have the settings changed. Advised him that I could place an order for him to have the settings changed instead of getting a new CPAP machine, he verbalized understanding.   Will place order to [facility].   Recall has been placed for him to follow up in 2 months with RA.   Nothing further needed at time of call.

## 2019-09-21 ENCOUNTER — Telehealth: Payer: Self-pay | Admitting: Podiatry

## 2019-09-21 NOTE — Telephone Encounter (Signed)
Dr. Randie Heinz for pt to return to work without restrictions and I needed his return to work date, and how he would like to receive the note.

## 2019-09-21 NOTE — Telephone Encounter (Signed)
Return to work letter place with front office staff for pt to pick up tomorrow.

## 2019-09-21 NOTE — Telephone Encounter (Signed)
Pt returning nurses call stating he would like his return to work date to be Tuesday, 09/26/19. Pt said he will come by the office tomorrow to pick up the note.

## 2019-09-21 NOTE — Telephone Encounter (Signed)
Pt is requesting a work note to return as soon as possible due to disability denied claim & he can't afford to be out of work.  States he can not have restrictions as they will not let him return if he does.  Surgery 06-14-2019-excision of benign lesion right foot & hallux arthroplasty right foot with tissue transfer excision of sesamoid bone. Pt states he is still having swelling that he  & has been in physical therapy twice a week (last visit 09-21-2019). He has also cancelled his medical insurance because he cannot afford it and cancelled his appointment on 09-29-2019.  Please contact patient to advise.

## 2019-09-29 ENCOUNTER — Encounter: Payer: BC Managed Care – PPO | Admitting: Podiatry

## 2019-10-06 ENCOUNTER — Encounter: Payer: Self-pay | Admitting: Physical Medicine and Rehabilitation

## 2019-10-06 ENCOUNTER — Ambulatory Visit: Payer: BC Managed Care – PPO | Admitting: Registered Nurse

## 2019-10-06 ENCOUNTER — Encounter
Payer: BC Managed Care – PPO | Attending: Physical Medicine & Rehabilitation | Admitting: Physical Medicine and Rehabilitation

## 2019-10-06 ENCOUNTER — Other Ambulatory Visit: Payer: Self-pay

## 2019-10-06 VITALS — BP 143/90 | HR 78 | Temp 97.9°F | Ht 76.0 in | Wt 351.2 lb

## 2019-10-06 DIAGNOSIS — G894 Chronic pain syndrome: Secondary | ICD-10-CM | POA: Insufficient documentation

## 2019-10-06 DIAGNOSIS — M21611 Bunion of right foot: Secondary | ICD-10-CM | POA: Insufficient documentation

## 2019-10-06 DIAGNOSIS — M79672 Pain in left foot: Secondary | ICD-10-CM | POA: Diagnosis not present

## 2019-10-06 DIAGNOSIS — M21612 Bunion of left foot: Secondary | ICD-10-CM | POA: Diagnosis not present

## 2019-10-06 DIAGNOSIS — M25562 Pain in left knee: Secondary | ICD-10-CM | POA: Diagnosis present

## 2019-10-06 DIAGNOSIS — M79671 Pain in right foot: Secondary | ICD-10-CM | POA: Insufficient documentation

## 2019-10-06 DIAGNOSIS — G609 Hereditary and idiopathic neuropathy, unspecified: Secondary | ICD-10-CM | POA: Diagnosis not present

## 2019-10-06 DIAGNOSIS — G608 Other hereditary and idiopathic neuropathies: Secondary | ICD-10-CM | POA: Diagnosis not present

## 2019-10-06 DIAGNOSIS — E538 Deficiency of other specified B group vitamins: Secondary | ICD-10-CM | POA: Diagnosis not present

## 2019-10-06 DIAGNOSIS — G8929 Other chronic pain: Secondary | ICD-10-CM

## 2019-10-06 MED ORDER — OXYCODONE-ACETAMINOPHEN 7.5-325 MG PO TABS: 1.0000 | ORAL_TABLET | Freq: Four times a day (QID) | ORAL | 0 refills | Status: DC | PRN

## 2019-10-06 MED ORDER — PREGABALIN 300 MG PO CAPS: 300.0000 mg | ORAL_CAPSULE | Freq: Two times a day (BID) | ORAL | 2 refills | Status: DC

## 2019-10-06 NOTE — Progress Notes (Signed)
Subjective:    Patient ID: Eduardo [name], male    DOB: Apr 15, 1971, 48 y.o.   MRN: [id number]  HPI  [name] presents for follow-up of his chronic pain.  His chief complaint is left knee pain which started 3 months ago. Usually his main source of pain is his low back and bilateral ankles but currently his knee pain is worst. There was no inciting event associated with pain onset. He has been taking his Percocet 4 times per day and would prefer an increase in dose to 5 pills per day.   He has restarted work and feels this has aggravated his pain. He is on his feet from 6 in the morning until the evening. He wears supportive shoes but is flat-footed. He has tried orthotics in the past but they have not helped. He requests that I complete a form for him for work. The Lyrica and Percocet do not make him drowsy.  He would still like to get the gastric bypass surgery but needs to save up for it as it costs $900. He has been trying to eat healthy--chicken, eggs, lettuce, beans, broccolli, and fruit are some of the foods he has been eating.    Pain Inventory Average Pain 9 Pain Right Now 8 My pain is sharp, burning, dull, stabbing and aching  In the last 24 hours, has pain interfered with the following? General activity 9 Relation with others 8 Enjoyment of life 10 What TIME of day is your pain at its worst? all Sleep (in general) Poor  Pain is worse with: walking, bending, sitting, inactivity and standing Pain improves with: rest and medication Relief from Meds: 6  Mobility how many minutes can you walk? 5 or less ability to climb steps?  yes do you drive?  yes  Function employed # of hrs/week 40  Neuro/Psych numbness trouble walking spasms depression anxiety  Prior Studies Any changes since last visit?  yes x-rays nerve study  Physicians involved in your care Primary care Mina Marble, NP   Family History  Problem Relation Age of Onset  . Cancer Maternal  Grandmother        breast, ovarian & colon  . Hypertension Mother   . Diabetes Mother   . Hypertension Father   . Heart disease Father   . Stroke Paternal Grandfather   . Neuropathy Neg Hx    Social History   Socioeconomic History  . Marital status: Divorced    Spouse name: Not on file  . Number of children: 2  . Years of education: 12th   . Highest education level: Not on file  Occupational History  . Occupation: [name]  Tobacco Use  . Smoking status: Current Every Day Smoker    Packs/day: 0.25    Years: 33.00    Pack years: 8.25    Types: Cigarettes  . Smokeless tobacco: Never Used  . Tobacco comment: 06/28/19 1/4 pack per day  Substance and Sexual Activity  . Alcohol use: No    Alcohol/week: 0.0 standard drinks    Comment: Rarely  . Drug use: No  . Sexual activity: Yes    Birth control/protection: None  Other Topics Concern  . Not on file  Social History Narrative   Lives at home w/ his step-mother   Right-handed   Drinks about 2-3 [place] a day   Social Determinants of Health   Financial Resource Strain:   . Difficulty of Paying Living Expenses: Not on file  Food Insecurity:   .  Worried About Charity fundraiser in the Last Year: Not on file  . Ran Out of Food in the Last Year: Not on file  Transportation Needs:   . Lack of Transportation (Medical): Not on file  . Lack of Transportation (Non-Medical): Not on file  Physical Activity:   . Days of Exercise per Week: Not on file  . Minutes of Exercise per Session: Not on file  Stress:   . Feeling of Stress : Not on file  Social Connections:   . Frequency of Communication with Friends and Family: Not on file  . Frequency of Social Gatherings with Friends and Family: Not on file  . Attends Religious Services: Not on file  . Active Member of Clubs or Organizations: Not on file  . Attends Archivist Meetings: Not on file  . Marital Status: Not on file   Past Surgical History:  Procedure Laterality  Date  . Irrigation and debridement of back abscess    . MASS EXCISION Right 06/14/2019   Procedure: EXCISION BENIGN LESION RIGHT FOOT;  Surgeon: [name], DPM;  Location: [facility];  Service: Podiatry;  Laterality: Right;  . NASAL SEPTOPLASTY W/ TURBINOPLASTY [name] 02/03/2018   Procedure: NASAL SEPTOPLASTY WITH TURBINATE REDUCTION;  Surgeon: [name], MD;  Location: [place];  Service: ENT;  Laterality: [name];  . PILONIDAL CYST DRAINAGE [name] 04/10/2017   Procedure: IRRIGATION AND DEBRIDEMENT BACK ABSCESS;  Surgeon: [name], MD;  Location: [facility];  Service: General;  Laterality: [name];  . SINUS ENDO WITH FUSION [name] 02/03/2018   Procedure: ENDOSCOPIC SINUS SURGERY WITH FUSION;  Surgeon: [name], MD;  Location: [place];  Service: ENT;  Laterality: [name];  . THORACIC OUTLET SURGERY  2000  . TOE ARTHROPLASTY Right 06/14/2019   Procedure: HALLUX ARTHROPLASTY RIGHT FOOT WITH TISSUE TRANSER EXCISION OF SESAMOID BONE;  Surgeon: [name], DPM;  Location: [facility];  Service: Podiatry;  Laterality: Right;  . WRIST FUSION  2002   Past Medical History:  Diagnosis Date  . Anxiety   . Chronic back pain   . Degenerative disk disease   . Depression   . DVT of upper extremity (deep vein thrombosis) ([place])   . GERD (gastroesophageal reflux disease)   . Hypertension   . MRSA (methicillin resistant staph aureus) culture positive   . Peripheral neuropathy   . Pilonidal cyst   . Pulmonary embolus ([place])    no blood thinner 2002  . Sleep apnea    can't use cpap due to deviated nasal septumg   BP (!) 143/90   Pulse 78   Temp 97.9 F (36.6 C)   Ht 6\' 4"  (1.93 m)   Wt (!) 351 lb 3.2 oz (159.3 kg)   SpO2 95%   BMI 42.75 kg/m   Opioid Risk Score:   Fall Risk Score:  `1  Depression screen PHQ 2/9  Depression screen [facility] 2/9 08/28/2019 06/28/2019 05/15/2019 04/25/2019 03/29/2019 03/28/2019 03/07/2019  Decreased Interest 0 0 1 0 0 0 2  Down, Depressed, Hopeless 1 0 1 0 0 0 1  PHQ - 2 Score 1 0 2 0 0 0 3    Altered sleeping - - 3 - - - 3  Tired, decreased energy - - 2 - - - 3  Change in appetite - - 1 - - - 2  Feeling bad or failure about yourself  - - 0 - - - 0  Trouble concentrating - - 2 - - - 1  Moving slowly or fidgety/restless - - 0 - - - -  Suicidal thoughts - - 0 - - - 0  PHQ-9 Score - - 10 - - - 12  Difficult doing work/chores - - Somewhat difficult - - - Very difficult  Some recent data might be hidden     Review of Systems  Musculoskeletal: Positive for gait problem.  Neurological: Positive for numbness.  Psychiatric/Behavioral: Positive for dysphoric mood. The patient is nervous/anxious.   All other systems reviewed and are negative.      Objective:   Physical Exam  Gen: depressed, normal appearing HEENT: oral mucosa pink and moist, NCAT Cardio: Reg rate Chest: normal effort, normal rate of breathing Abd: soft, non-distended Ext: no edema Skin: intact Neuro/Musculoskeletal: AOx3. Normal gait. Moves all extremities antigravity.   Psych: pleasant, flat affect       Assessment & Plan:  Mr. Nydam is a 48 year old man with chronic pain who is here for refill of his chronic pain medications.  --His bilateral knee and ankle pain continues to be severe and he is developing tolerance to his Percocet. I educated him that this is to be expected and that Percocet is not recommended long term for chronic pain as the body develops tolerance to it and chronic pain can worsen as a result. He understood. I told him I will refill his prescription but not increase his dose, and he was agreeable. He plans to have knee surgery once he has recovered from ankle surgery and is able to return to work and thus afford knee surgery. I discussed that we could try to find alternative, less addictive agents in the meantime to provide him a better quality of life and for better pain control. He asks about CBD oil, and I recommend this as an adjuvant.   --He is unable to exercise beyond his  ankle PT due to his pain. He knows that his obesity is a large component of his pain due to the excess weight it puts on his joints. He wants to lose weight and pursue a gastric bypass once he can afford this. He has made significant strides in his diet and I commended him for this. As a result he was able to get off HCTZ and his blood pressure is controlled with 5mg  of amlodipine.  --Lyrica is controlling his neuropathic pain well. I told him we can not increase the dose further since he is already at the max dose of 600mg  daily. I also advised that Lyrica can cause weight gain and may be a reason he is having difficulty losing weight despite his improved diet.   --He is also having insomnia exacerbated by the need to urinate every 1-1.5 hours. He has had his prostate checked and was told it is not enlarged. He is not on any medications that should increase urinary frequency. He also reports depression and anxiety. I recommended amitriptyline at night to help with depression, insomnia, and neuropathic pain. It also has the side effect of urinary retention which could be helpful in decreasing Mr. Seraphin urinary frequency. Unfortunately, he has not tried taking this medication since his last visit.   --He was thankful for the time spent with him and positive regarding our treatment plan. Ultimately our goal will be to improve his quality of life, help him return to work, help him to return to walking, get gastric bypass surgery and bilateral knee surgery, aid in weight loss and help him to sleep better, and help  to treat his depression and anxiety. We will utilize the biopsychosocial model to help achieve these goals. I completed form for his work. Given his worsening left knee pain, I have ordered an XR and MRI of his left knee, suspect OA. Based on his results, can consider Synvisc injection. RTC in 4 weeks.

## 2019-10-09 ENCOUNTER — Telehealth: Payer: Self-pay | Admitting: Physical Medicine and Rehabilitation

## 2019-10-09 NOTE — Telephone Encounter (Signed)
There is going to need a peer to peer to get the patients MRI approved with [employer].  Dr. [name] please call them at [phone] and make sure you give them the patients Ins number of [id number], this is from Jeannine Kitten 2:35pm.  Any questions you can call me.  Thanks.

## 2019-10-26 ENCOUNTER — Ambulatory Visit: Payer: BC Managed Care – PPO | Admitting: Infectious Disease

## 2019-11-01 ENCOUNTER — Telehealth: Payer: Self-pay

## 2019-11-01 NOTE — Telephone Encounter (Signed)
Patient called stating he does not money for his copay until he gets paid on the 30th. Needs to reschedule appt but will run out of meds. Oxycodone/APAP last filled #120 on 10/09/2019.

## 2019-11-02 ENCOUNTER — Other Ambulatory Visit: Payer: Self-pay | Admitting: Physical Medicine and Rehabilitation

## 2019-11-02 MED ORDER — OXYCODONE-ACETAMINOPHEN 7.5-325 MG PO TABS: 1.0000 | ORAL_TABLET | Freq: Four times a day (QID) | ORAL | 0 refills | Status: DC | PRN

## 2019-11-03 ENCOUNTER — Encounter: Payer: Self-pay | Admitting: Physical Medicine and Rehabilitation

## 2019-11-03 ENCOUNTER — Encounter
Payer: BC Managed Care – PPO | Attending: Physical Medicine & Rehabilitation | Admitting: Physical Medicine and Rehabilitation

## 2019-11-03 ENCOUNTER — Other Ambulatory Visit: Payer: Self-pay

## 2019-11-03 VITALS — BP 123/82 | HR 84 | Temp 97.5°F | Ht 77.0 in | Wt 348.0 lb

## 2019-11-03 DIAGNOSIS — Z5181 Encounter for therapeutic drug level monitoring: Secondary | ICD-10-CM | POA: Diagnosis not present

## 2019-11-03 DIAGNOSIS — G8929 Other chronic pain: Secondary | ICD-10-CM | POA: Diagnosis present

## 2019-11-03 DIAGNOSIS — G609 Hereditary and idiopathic neuropathy, unspecified: Secondary | ICD-10-CM | POA: Insufficient documentation

## 2019-11-03 DIAGNOSIS — M21612 Bunion of left foot: Secondary | ICD-10-CM | POA: Diagnosis not present

## 2019-11-03 DIAGNOSIS — E538 Deficiency of other specified B group vitamins: Secondary | ICD-10-CM | POA: Diagnosis present

## 2019-11-03 DIAGNOSIS — M79672 Pain in left foot: Secondary | ICD-10-CM | POA: Diagnosis not present

## 2019-11-03 DIAGNOSIS — M25571 Pain in right ankle and joints of right foot: Secondary | ICD-10-CM

## 2019-11-03 DIAGNOSIS — M25572 Pain in left ankle and joints of left foot: Secondary | ICD-10-CM

## 2019-11-03 DIAGNOSIS — G608 Other hereditary and idiopathic neuropathies: Secondary | ICD-10-CM | POA: Insufficient documentation

## 2019-11-03 DIAGNOSIS — M21611 Bunion of right foot: Secondary | ICD-10-CM | POA: Diagnosis not present

## 2019-11-03 DIAGNOSIS — M25562 Pain in left knee: Secondary | ICD-10-CM

## 2019-11-03 DIAGNOSIS — G894 Chronic pain syndrome: Secondary | ICD-10-CM | POA: Insufficient documentation

## 2019-11-03 DIAGNOSIS — Z6841 Body Mass Index (BMI) 40.0 and over, adult: Secondary | ICD-10-CM

## 2019-11-03 DIAGNOSIS — G4733 Obstructive sleep apnea (adult) (pediatric): Secondary | ICD-10-CM

## 2019-11-03 DIAGNOSIS — M79671 Pain in right foot: Secondary | ICD-10-CM | POA: Insufficient documentation

## 2019-11-03 MED ORDER — OXYCODONE-ACETAMINOPHEN 7.5-325 MG PO TABS: 1.0000 | ORAL_TABLET | Freq: Four times a day (QID) | ORAL | 0 refills | Status: DC | PRN

## 2019-11-03 MED ORDER — MODAFINIL 100 MG PO TABS: 100.0000 mg | ORAL_TABLET | Freq: Every day | ORAL | 1 refills | Status: DC

## 2019-11-03 NOTE — Progress Notes (Signed)
Subjective:    Patient ID: Eduardo [name], male    DOB: Jul 11, 1971, 49 y.o.   MRN: [id number]  HPI  Eduardo [name] presents for follow-up of his chronic pain.  His chief complaint is left knee pain which started 3 months ago. Usually his main source of pain is his low back and bilateral ankles but currently his knee pain is worst. There was no inciting event associated with pain onset. He has been taking his Percocet 4 times per day and would prefer an increase in dose to 5 pills per day.   He has restarted work and feels this has aggravated his pain. He is on his feet from 6 in the morning until the evening. He wears supportive shoes but is flat-footed. He has tried orthotics in the past but they have not helped. He requests that I complete a form for him for work. The Lyrica and Percocet do not make him drowsy.  He would still like to get the gastric bypass surgery but needs to save up for it as it costs $900. He has been trying to eat healthy--chicken, eggs, lettuce, beans, broccolli, and fruit are some of the foods he has been eating.   1/15: Pain continues to be constant. Present at the day at work when he is very active, carrying heavy hoses, and pulsates at night. He is only able to sleep 1 hour per night. He has sleep apnea and does not tolerate machine well. He has been eating very minimally since he did not receive pay for the time he didn't work. He is eating beans or potatoes once per day and his sister gives him a multivitamin. He hopes he will at least lose weight this way. He was unable to get MRI of his left knee as it was not approved. He says he will be able to get an XR at his orthopedic office. He continues to take the Percocet 4 times per day. He stopped taking the Elavil as it made him sleep too soundly and he urinated on himself at night.    Pain Inventory Average Pain 8 Pain Right Now 7 My pain is sharp, burning, tingling and aching  In the last 24 hours, has pain  interfered with the following? General activity 9 Relation with others 8 Enjoyment of life 10 What TIME of day is your pain at its worst? all Sleep (in general) Poor  Pain is worse with: walking, bending, sitting, inactivity, standing and some activites Pain improves with: rest and medication Relief from Meds: 8  Mobility walk without assistance ability to climb steps?  yes do you drive?  yes  Function employed # of hrs/week 40  Neuro/Psych numbness tingling trouble walking depression anxiety  Prior Studies Any changes since last visit?  no  Physicians involved in your care Any changes since last visit?  no   Family History  Problem Relation Age of Onset  . Cancer Maternal Grandmother        breast, ovarian & colon  . Hypertension Mother   . Diabetes Mother   . Hypertension Father   . Heart disease Father   . Stroke Paternal Grandfather   . Neuropathy Neg Hx    Social History   Socioeconomic History  . Marital status: Divorced    Spouse name: Not on file  . Number of children: 2  . Years of education: 12th   . Highest education level: Not on file  Occupational History  . Occupation: [name]  Tobacco Use  . Smoking status: Current Every Day Smoker    Packs/day: 0.25    Years: 33.00    Pack years: 8.25    Types: Cigarettes  . Smokeless tobacco: Never Used  . Tobacco comment: 06/28/19 1/4 pack per day  Substance and Sexual Activity  . Alcohol use: No    Alcohol/week: 0.0 standard drinks    Comment: Rarely  . Drug use: No  . Sexual activity: Yes    Birth control/protection: None  Other Topics Concern  . Not on file  Social History Narrative   Lives at home w/ his step-mother   Right-handed   Drinks about 2-3 [place] a day   Social Determinants of Health   Financial Resource Strain:   . Difficulty of Paying Living Expenses: Not on file  Food Insecurity:   . Worried About Charity fundraiser in the Last Year: Not on file  . Ran Out of Food in the  Last Year: Not on file  Transportation Needs:   . Lack of Transportation (Medical): Not on file  . Lack of Transportation (Non-Medical): Not on file  Physical Activity:   . Days of Exercise per Week: Not on file  . Minutes of Exercise per Session: Not on file  Stress:   . Feeling of Stress : Not on file  Social Connections:   . Frequency of Communication with Friends and Family: Not on file  . Frequency of Social Gatherings with Friends and Family: Not on file  . Attends Religious Services: Not on file  . Active Member of Clubs or Organizations: Not on file  . Attends Archivist Meetings: Not on file  . Marital Status: Not on file   Past Surgical History:  Procedure Laterality Date  . Irrigation and debridement of back abscess    . MASS EXCISION Right 06/14/2019   Procedure: EXCISION BENIGN LESION RIGHT FOOT;  Surgeon: [name], DPM;  Location: [facility];  Service: Podiatry;  Laterality: Right;  . NASAL SEPTOPLASTY W/ TURBINOPLASTY [name] 02/03/2018   Procedure: NASAL SEPTOPLASTY WITH TURBINATE REDUCTION;  Surgeon: [name], MD;  Location: [place];  Service: ENT;  Laterality: [name];  . PILONIDAL CYST DRAINAGE [name] 04/10/2017   Procedure: IRRIGATION AND DEBRIDEMENT BACK ABSCESS;  Surgeon: [name], MD;  [facility];  Service: General;  Laterality: [name];  . SINUS ENDO WITH FUSION [name] 02/03/2018   Procedure: ENDOSCOPIC SINUS SURGERY WITH FUSION;  Surgeon: [name], MD;  Location: [place];  Service: ENT;  Laterality: [name];  . THORACIC OUTLET SURGERY  2000  . TOE ARTHROPLASTY Right 06/14/2019   Procedure: HALLUX ARTHROPLASTY RIGHT FOOT WITH TISSUE TRANSER EXCISION OF SESAMOID BONE;  Surgeon: [name], DPM;  [facility];  Service: Podiatry;  Laterality: Right;  . WRIST FUSION  2002   Past Medical History:  Diagnosis Date  . Anxiety   . Chronic back pain   . Degenerative disk disease   . Depression   . DVT of upper extremity (deep vein thrombosis) ([place])   .  GERD (gastroesophageal reflux disease)   . Hypertension   . MRSA (methicillin resistant staph aureus) culture positive   . Peripheral neuropathy   . Pilonidal cyst   . Pulmonary embolus ([place])    no blood thinner 2002  . Sleep apnea    can't use cpap due to deviated nasal septumg   There were no vitals taken for this visit.  Opioid Risk Score:   Fall  Risk Score:  `1  Depression screen PHQ 2/9  Depression screen [facility] 2/9 08/28/2019 06/28/2019 05/15/2019 04/25/2019 03/29/2019 03/28/2019 03/07/2019  Decreased Interest 0 0 1 0 0 0 2  Down, Depressed, Hopeless 1 0 1 0 0 0 1  PHQ - 2 Score 1 0 2 0 0 0 3  Altered sleeping - - 3 - - - 3  Tired, decreased energy - - 2 - - - 3  Change in appetite - - 1 - - - 2  Feeling bad or failure about yourself  - - 0 - - - 0  Trouble concentrating - - 2 - - - 1  Moving slowly or fidgety/restless - - 0 - - - -  Suicidal thoughts - - 0 - - - 0  PHQ-9 Score - - 10 - - - 12  Difficult doing work/chores - - Somewhat difficult - - - Very difficult  Some recent data might be hidden     Review of Systems  Constitutional: Negative.   HENT: Negative.   Eyes: Negative.   Respiratory: Negative.   Cardiovascular: Negative.   Gastrointestinal: Negative.   Endocrine: Negative.   Genitourinary: Negative.   Musculoskeletal: Positive for arthralgias and gait problem.  Skin: Negative.   Allergic/Immunologic: Negative.   Neurological: Positive for numbness.  Hematological: Negative.   Psychiatric/Behavioral: Positive for dysphoric mood. The patient is nervous/anxious.   All other systems reviewed and are negative.      Objective:   Physical Exam [id number], normal appearing HEENT: oral mucosa pink and moist, NCAT Cardio: Reg rate Chest: normal effort, normal rate of breathing Abd: soft, non-distended Ext: no edema Skin: intact Neuro/Musculoskeletal:AOx3. Normal gait. Moves all extremities antigravity.Increased swelling in left knee as compared to  right.  Psych: pleasant, flat affect       Assessment & Plan:  Eduardo [name] is a 49 year old man with chronic pain who is here for management of his chronic knee and ankle pain.   --His bilateral knee and ankle pain continues to be severe and he is developing tolerance to his Percocet. I educated him that this is to be expected and that Percocet is not recommended long term for chronic pain as the body develops tolerance to it and chronic pain can worsen as a result. He understood. I told him I will refill his prescription but not increase his dose, and he was agreeable. He plans to have knee surgery once he has recovered from ankle surgery and is able to return to work and thus afford knee surgery. I discussed that we could try to find alternative, less addictive agents in the meantime to provide him a better quality of life and for better pain control. He asks about CBD oil, and I recommend this as an adjuvant. He will be getting XR of his left knee from orthopedic office, suspect meniscal tear and OA.   --He is unable to exercise beyond his ankle PT due to his pain. He knows that his obesity is a large component of his pain due to the excess weight it puts on his joints. He wants to lose weight and pursue a gastric bypass once he can afford this. He had made significant strides in his diet and I commended him for this. As a result he was able to get off HCTZ and his blood pressure is controlled with 5mg  of amlodipine. Currently his diet is very limited due to his poverty and he hopes this will help him lose weight. He  hopes to receive a stimulus check soon that will improve his financial situation.   --Lyrica is controlling his neuropathic pain well. I told him we can not increase the dose further since he is already at the max dose of 600mg  daily. I also advised that Lyrica can cause weight gain and may be a reason he is having difficulty losing weight despite his improved diet.   --He is  also having insomnia exacerbated by the need to urinate every 1-1.5 hours. He has had his prostate checked and was told it is not enlarged. He is not on any medications that should increase urinary frequency. He also reports depression and anxiety. I recommended amitriptyline at night to help with depression, insomnia, and neuropathic pain. It also has the side effect of urinary retention which could have been helpful in decreasing Eduardo [name] urinary frequency. Unfortunately, it caused him to sleep too soundly and he urinated on himself during the night so discontinued medication.   --Sleep apnea: since he does not tolerate CPAP machine well, I recommend Modafinil during the day to help increase his energy.   --He has returned to work and is walking much more, which is great!  --He was thankful for the time spent with him and positive regarding our treatment plan. Ultimately our goal will be to improve his quality of life, get gastric bypass surgery and bilateral knee surgery, aid in weight loss and help him to sleep better, and help to treat his depression and anxiety. We will utilize the biopsychosocial model to help achieve these goals. RTC in 4 weeks.

## 2019-11-29 ENCOUNTER — Encounter
Payer: BC Managed Care – PPO | Attending: Physical Medicine & Rehabilitation | Admitting: Physical Medicine and Rehabilitation

## 2019-11-29 ENCOUNTER — Other Ambulatory Visit: Payer: Self-pay

## 2019-11-29 ENCOUNTER — Encounter: Payer: Self-pay | Admitting: Physical Medicine and Rehabilitation

## 2019-11-29 VITALS — Temp 97.5°F | Ht 77.0 in | Wt 350.0 lb

## 2019-11-29 DIAGNOSIS — M25562 Pain in left knee: Secondary | ICD-10-CM | POA: Insufficient documentation

## 2019-11-29 DIAGNOSIS — M25572 Pain in left ankle and joints of left foot: Secondary | ICD-10-CM

## 2019-11-29 DIAGNOSIS — G8929 Other chronic pain: Secondary | ICD-10-CM | POA: Insufficient documentation

## 2019-11-29 DIAGNOSIS — M79671 Pain in right foot: Secondary | ICD-10-CM | POA: Insufficient documentation

## 2019-11-29 DIAGNOSIS — G894 Chronic pain syndrome: Secondary | ICD-10-CM | POA: Insufficient documentation

## 2019-11-29 DIAGNOSIS — G609 Hereditary and idiopathic neuropathy, unspecified: Secondary | ICD-10-CM | POA: Insufficient documentation

## 2019-11-29 DIAGNOSIS — M21611 Bunion of right foot: Secondary | ICD-10-CM | POA: Insufficient documentation

## 2019-11-29 DIAGNOSIS — M25571 Pain in right ankle and joints of right foot: Secondary | ICD-10-CM | POA: Diagnosis not present

## 2019-11-29 DIAGNOSIS — Z6841 Body Mass Index (BMI) 40.0 and over, adult: Secondary | ICD-10-CM

## 2019-11-29 DIAGNOSIS — Z5181 Encounter for therapeutic drug level monitoring: Secondary | ICD-10-CM

## 2019-11-29 DIAGNOSIS — M21612 Bunion of left foot: Secondary | ICD-10-CM | POA: Insufficient documentation

## 2019-11-29 DIAGNOSIS — G608 Other hereditary and idiopathic neuropathies: Secondary | ICD-10-CM | POA: Insufficient documentation

## 2019-11-29 DIAGNOSIS — G4733 Obstructive sleep apnea (adult) (pediatric): Secondary | ICD-10-CM

## 2019-11-29 DIAGNOSIS — M79672 Pain in left foot: Secondary | ICD-10-CM | POA: Insufficient documentation

## 2019-11-29 DIAGNOSIS — E538 Deficiency of other specified B group vitamins: Secondary | ICD-10-CM | POA: Insufficient documentation

## 2019-11-29 MED ORDER — OXYCODONE-ACETAMINOPHEN 7.5-325 MG PO TABS: 1.0000 | ORAL_TABLET | Freq: Four times a day (QID) | ORAL | 0 refills | Status: DC | PRN

## 2019-11-29 MED ORDER — PREGABALIN 300 MG PO CAPS: 300.0000 mg | ORAL_CAPSULE | Freq: Two times a day (BID) | ORAL | 2 refills | Status: DC

## 2019-11-29 NOTE — Progress Notes (Signed)
Subjective:    Patient ID: Eduardo [name], male    DOB: [birth year], 49 y.o.   MRN: [id number]  HPI  Due to national recommendations of social distancing because of COVID 60, an audio/video tele-health visit is felt to be the most appropriate encounter for this patient at this time. See MyChart message from today for the patient's consent to a tele-health encounter with [facility]. This is a follow up tele-visit via phone. The patient is at home. MD is at office.   Eduardo [name] presents for follow-up of his chronic pain.  His chief complaint is left knee pain which started 4 months ago. Usually his main source of pain is his low back and bilateral ankles but currently his knee pain is worst. There was no inciting event associated with pain onset. He has been taking his Percocet 4 times per day and would prefer an increase in dose to 5 pills per day. He needs refills of his Percocet and Lyrica today. He plans to get XR of his knee at his orthopedic office.   He has restarted work and feels this has aggravated his pain. He is on his feet from 6 in the morning until the evening. He wears supportive shoes but is flat-footed. He has tried orthotics in the past but they have not helped. The Lyrica and Percocet do not make him drowsy at work. He has been able to continue working every day and gets good exercise during work.   He would still like to get the gastric bypass surgery but needs to save up for it as it costs $900. He has been trying to eat healthy--chicken, eggs, lettuce, beans, broccolli, and fruit are some of the foods he has been eating.He recently got his stimulus check and so has been able to purchase healthier foods. He does not check his weight regularly as he does not have a machine at home.   His sleep continues to be impaired and he does not wear CPAP for his OSA. He has not found the Modafinil to improve his energy during the day.   Pain  Inventory Average Pain 7 Pain Right Now 8 My pain is constant, sharp, burning, dull, stabbing, tingling and aching  In the last 24 hours, has pain interfered with the following? General activity 5 Relation with others 5 Enjoyment of life 5 What TIME of day is your pain at its worst? daytime Sleep (in general) Poor  Pain is worse with: walking, bending, some activites and heavy lifting Pain improves with: rest, medication and bending all the way over Relief from Meds: 4  Mobility walk without assistance ability to climb steps?  yes do you drive?  yes  Function employed # of hrs/week .  Neuro/Psych numbness tingling spasms depression anxiety  Prior Studies Any changes since last visit?  no  Physicians involved in your care Any changes since last visit?  no   Family History  Problem Relation Age of Onset  . Cancer Maternal Grandmother        breast, ovarian & colon  . Hypertension Mother   . Diabetes Mother   . Hypertension Father   . Heart disease Father   . Stroke Paternal Grandfather   . Neuropathy Neg Hx    Social History   Socioeconomic History  . Marital status: Divorced    Spouse name: Not on file  . Number of children: 2  . Years of education: 12th   . Highest  education level: Not on file  Occupational History  . Occupation: [name]  Tobacco Use  . Smoking status: Current Every Day Smoker    Packs/day: 0.25    Years: 33.00    Pack years: 8.25    Types: Cigarettes  . Smokeless tobacco: Never Used  . Tobacco comment: 06/28/19 1/4 pack per day  Substance and Sexual Activity  . Alcohol use: No    Alcohol/week: 0.0 standard drinks    Comment: Rarely  . Drug use: No  . Sexual activity: Yes    Birth control/protection: None  Other Topics Concern  . Not on file  Social History Narrative   Lives at home w/ his step-mother   Right-handed   Drinks about 2-3 [employer] a day   Social Determinants of Health   Financial Resource Strain:   . Difficulty  of Paying Living Expenses: Not on file  Food Insecurity:   . Worried About Charity fundraiser in the Last Year: Not on file  . Ran Out of Food in the Last Year: Not on file  Transportation Needs:   . Lack of Transportation (Medical): Not on file  . Lack of Transportation (Non-Medical): Not on file  Physical Activity:   . Days of Exercise per Week: Not on file  . Minutes of Exercise per Session: Not on file  Stress:   . Feeling of Stress : Not on file  Social Connections:   . Frequency of Communication with Friends and Family: Not on file  . Frequency of Social Gatherings with Friends and Family: Not on file  . Attends Religious Services: Not on file  . Active Member of Clubs or Organizations: Not on file  . Attends Archivist Meetings: Not on file  . Marital Status: Not on file   Past Surgical History:  Procedure Laterality Date  . Irrigation and debridement of back abscess    . MASS EXCISION Right 06/14/2019   Procedure: EXCISION BENIGN LESION RIGHT FOOT;  Surgeon: [name], DPM;  Location: [facility];  Service: Podiatry;  Laterality: Right;  . NASAL SEPTOPLASTY W/ TURBINOPLASTY [name] 02/03/2018   Procedure: NASAL SEPTOPLASTY WITH TURBINATE REDUCTION;  Surgeon: [name], MD;  Location: [facility];  Service: ENT;  Laterality: [name];  . PILONIDAL CYST DRAINAGE [name] 04/10/2017   Procedure: IRRIGATION AND DEBRIDEMENT BACK ABSCESS;  Surgeon: [name], MD;  Location: [facility];  Service: General;  Laterality: [name];  . SINUS ENDO WITH FUSION [name] 02/03/2018   Procedure: ENDOSCOPIC SINUS SURGERY WITH FUSION;  Surgeon: [name], MD;  Location: [facility];  Service: ENT;  Laterality: [name];  . THORACIC OUTLET SURGERY  2000  . TOE ARTHROPLASTY Right 06/14/2019   Procedure: HALLUX ARTHROPLASTY RIGHT FOOT WITH TISSUE TRANSER EXCISION OF SESAMOID BONE;  Surgeon: [name], DPM;  Location: [facility];  Service: Podiatry;  Laterality: Right;  . WRIST FUSION  2002   Past Medical History:    Diagnosis Date  . Anxiety   . Chronic back pain   . Degenerative disk disease   . Depression   . DVT of upper extremity (deep vein thrombosis) ([place])   . GERD (gastroesophageal reflux disease)   . Hypertension   . MRSA (methicillin resistant staph aureus) culture positive   . Peripheral neuropathy   . Pilonidal cyst   . Pulmonary embolus ([place])    no blood thinner 2002  . Sleep apnea    can't use cpap due to deviated nasal septumg   Temp [name])  97.5 F (36.4 C)   Ht 6\' 5"  (1.956 m)   Wt (!) 350 lb (158.8 kg)   BMI 41.50 kg/m   Opioid Risk Score:   Fall Risk Score:  `1  Depression screen PHQ 2/9  Depression screen [facility] 2/9 08/28/2019 06/28/2019 05/15/2019 04/25/2019 03/29/2019 03/28/2019 03/07/2019  Decreased Interest 0 0 1 0 0 0 2  Down, Depressed, Hopeless 1 0 1 0 0 0 1  PHQ - 2 Score 1 0 2 0 0 0 3  Altered sleeping - - 3 - - - 3  Tired, decreased energy - - 2 - - - 3  Change in appetite - - 1 - - - 2  Feeling bad or failure about yourself  - - 0 - - - 0  Trouble concentrating - - 2 - - - 1  Moving slowly or fidgety/restless - - 0 - - - -  Suicidal thoughts - - 0 - - - 0  PHQ-9 Score - - 10 - - - 12  Difficult doing work/chores - - Somewhat difficult - - - Very difficult  Some recent data might be hidden     Review of Systems  Constitutional: Negative.   HENT: Negative.   Eyes: Negative.   Respiratory: Negative.   Cardiovascular: Negative.   Gastrointestinal: Negative.   Endocrine: Negative.   Genitourinary: Negative.   Musculoskeletal: Positive for arthralgias, back pain and myalgias.  Skin: Negative.   Allergic/Immunologic: Negative.   Neurological: Positive for numbness.  Hematological: Negative.   Psychiatric/Behavioral: Positive for dysphoric mood. The patient is nervous/anxious.   All other systems reviewed and are negative.      Objective:   Physical Exam  Not performed as patient was seen via phone.       Assessment & Plan:  Mr. Laudermilch is a 49  year old man with chronic pain who is here for management of his chronic knee and ankle pain.   --His bilateral knee and ankle pain continues to be severe and he is developing tolerance to his Percocet. I educated him that this is to be expected and that Percocet is not recommended long term for chronic pain as the body develops tolerance to it and chronic pain can worsen as a result. He understood. I told him I will refill his prescription but not increase his dose, and he was agreeable. He plans to have knee surgery once he has recovered from ankle surgery and is able to return to work and thus afford knee surgery. I discussed that we could try to find alternative, less addictive agents in the meantime to provide him a better quality of life and for better pain control. He has asked about CBD oil, and I recommend this as an adjuvant. He will be getting XR of his left knee from orthopedic office, suspect meniscal tear and OA. We have since tried Amitriptyline but it unfortunately made him urinate on himself at night since he slept so soundly and he prefers not to try a lower dose. We have increased his Lyrica to 300mg  BID which appears to have helped.   --He knows that his obesity is a large component of his pain due to the excess weight it puts on his joints. He wants to lose weight and pursue a gastric bypass once he can afford this. He had made significant strides in his diet and I commended him for this. As a result he was able to get off HCTZ and his blood pressure is controlled  with 5mg  of amlodipine. His diet has continued to improve since he received his stimulus check and has been able to perform better foods. He has recently been eating a lot of chicken and fruits and exercising a lot through his very physical work which he does for long hours.    --Lyrica is controlling his neuropathic pain well. I told him we can not increase the dose further since he is already at the max dose of 600mg  daily.  I also advised that Lyrica can cause weight gain and may be a reason he is having difficulty losing weight despite his improved diet. I will provide refill today.   --He is also having insomnia exacerbated by the need to urinate every 1-1.5 hours. He has had his prostate checked and was told it is not enlarged. He is not on any medications that should increase urinary frequency. He also reports depression and anxiety. I recommended amitriptyline at night to help with depression, insomnia, and neuropathic pain. It also has the side effect of urinary retention which could have been helpful in decreasing Mr. Lenning urinary frequency.Unfortunately, it caused him to sleep too soundly and he urinated on himself during the night so discontinued medication.   --Sleep apnea: since he does not tolerate CPAP machine well, I recommended Modafinil during the day to help increase his energy, but this appears to have not helped.   --He has returned to work and is walking much more, which is great!  --Ultimately our goal will be to improve his quality of life, get gastric bypass surgery and bilateral knee surgery, aid in weight loss and help him to sleep better, and help to treat his depression and anxiety. We will utilize the biopsychosocial model to help achieve these goals.   RTC in 4 weeks.

## 2019-12-04 ENCOUNTER — Other Ambulatory Visit: Payer: Self-pay | Admitting: Adult Health

## 2019-12-25 ENCOUNTER — Telehealth: Payer: Self-pay | Admitting: Adult Health

## 2019-12-25 NOTE — Telephone Encounter (Signed)
Eduardo [name]'s patient scheduled TELEHEALTH in compliance for refills.  --FYI to med asst  --glh

## 2019-12-27 ENCOUNTER — Other Ambulatory Visit: Payer: Self-pay

## 2019-12-27 ENCOUNTER — Encounter: Payer: Self-pay | Admitting: Physical Medicine and Rehabilitation

## 2019-12-27 ENCOUNTER — Encounter
Payer: BC Managed Care – PPO | Attending: Physical Medicine & Rehabilitation | Admitting: Physical Medicine and Rehabilitation

## 2019-12-27 VITALS — BP 128/82 | HR 78 | Temp 97.7°F | Ht 77.0 in | Wt 352.0 lb

## 2019-12-27 DIAGNOSIS — M25562 Pain in left knee: Secondary | ICD-10-CM

## 2019-12-27 DIAGNOSIS — E538 Deficiency of other specified B group vitamins: Secondary | ICD-10-CM | POA: Insufficient documentation

## 2019-12-27 DIAGNOSIS — G8929 Other chronic pain: Secondary | ICD-10-CM | POA: Insufficient documentation

## 2019-12-27 DIAGNOSIS — R0601 Orthopnea: Secondary | ICD-10-CM

## 2019-12-27 DIAGNOSIS — G609 Hereditary and idiopathic neuropathy, unspecified: Secondary | ICD-10-CM | POA: Diagnosis not present

## 2019-12-27 DIAGNOSIS — M21611 Bunion of right foot: Secondary | ICD-10-CM | POA: Diagnosis not present

## 2019-12-27 DIAGNOSIS — M21612 Bunion of left foot: Secondary | ICD-10-CM | POA: Insufficient documentation

## 2019-12-27 DIAGNOSIS — M79671 Pain in right foot: Secondary | ICD-10-CM | POA: Diagnosis not present

## 2019-12-27 DIAGNOSIS — G894 Chronic pain syndrome: Secondary | ICD-10-CM | POA: Diagnosis present

## 2019-12-27 DIAGNOSIS — R600 Localized edema: Secondary | ICD-10-CM | POA: Diagnosis not present

## 2019-12-27 DIAGNOSIS — G608 Other hereditary and idiopathic neuropathies: Secondary | ICD-10-CM | POA: Diagnosis not present

## 2019-12-27 DIAGNOSIS — M79672 Pain in left foot: Secondary | ICD-10-CM | POA: Diagnosis not present

## 2019-12-27 DIAGNOSIS — M25571 Pain in right ankle and joints of right foot: Secondary | ICD-10-CM

## 2019-12-27 DIAGNOSIS — M25572 Pain in left ankle and joints of left foot: Secondary | ICD-10-CM

## 2019-12-27 MED ORDER — OXYCODONE-ACETAMINOPHEN 7.5-325 MG PO TABS: 1.0000 | ORAL_TABLET | Freq: Four times a day (QID) | ORAL | 0 refills | Status: DC | PRN

## 2019-12-27 MED ORDER — ESCITALOPRAM OXALATE 5 MG PO TABS: 5.0000 mg | ORAL_TABLET | Freq: Every day | ORAL | 0 refills | Status: DC

## 2019-12-27 MED ORDER — PREGABALIN 300 MG PO CAPS: 300.0000 mg | ORAL_CAPSULE | Freq: Two times a day (BID) | ORAL | 2 refills | Status: DC

## 2019-12-27 NOTE — Progress Notes (Signed)
Subjective:    Patient ID: NOLAWI BUBIER, male    DOB: 1970-11-27, 49 y.o.   MRN: [id number]  HPI  Mr. Horwitz presents for follow-up of his chronic pain.  His chief complaint is left knee pain which started 5 months ago. Usually his main source of pain is his low back and bilateral ankles but currently his knee pain is worst. There was no inciting event associated with pain onset. He has been taking his Percocet 4 times per day and would prefer an increase in dose to 5 pills per day. He needs refills of his Percocet and Lyrica today. He asks whether his Lyrica can be increased further.  He has restarted work and feels this has aggravated his pain. He is on his feet from 6 in the morning until the evening. He wears supportive shoes but is flat-footed. He has tried orthotics in the past but they have not helped. The Lyrica and Percocet do not make him drowsy at work. He has been able to continue working every day and gets good exercise during work.   He would still like to get the gastric bypass surgery but needs to save up for it as it costs $900. He has been trying to eat healthy--chicken, eggs, lettuce, beans, broccolli, and fruit are some of the foods he has been eating.He recently got his stimulus check and so has been able to purchase healthier foods. He does not check his weight regularly as he does not have a machine at home.   His sleep continues to be impaired and he does not wear CPAP for his OSA. He has not found the Modafinil to improve his energy during the day. 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 He feels that anxiety is what prevents him from wearing the machine.   He also notes worsening lower extremity edema bilaterally. He has to sleep with 2 pillows at night for shortness of breath. He used to follow with a cardiologist at [facility] but cannot remember their name and says it has been a  long time since he has seen them. He needs a refill for his amlodipine 5mg  but says his PCP Mina Marble is leaving the practice and asks if I can prescribe it for him. His BP is 0000000 systolic in office. He does not have a BP machine at home.     Pain Inventory Average Pain 9 Pain Right Now 7 My pain is constant, sharp, burning and stabbing  In the last 24 hours, has pain interfered with the following? General activity 10 Relation with others 8 Enjoyment of life 10 What TIME of day is your pain at its worst? always Sleep (in general) Poor  Pain is worse with: walking, bending, sitting, inactivity, standing and some activites Pain improves with: medication Relief from Meds: 5  Mobility walk without assistance ability to climb steps?  yes do you drive?  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  no  Physicians involved in your care Any changes since last visit?  no   Family History  Problem Relation Age of Onset  . Cancer Maternal Grandmother        breast, ovarian & colon  . Hypertension Mother   . Diabetes Mother   . Hypertension Father   .  Heart disease Father   . Stroke Paternal Grandfather   . Neuropathy Neg Hx    Social History   Socioeconomic History  . Marital status: Divorced    Spouse name: Not on file  . Number of children: 2  . Years of education: 12th   . Highest education level: Not on file  Occupational History  . Occupation: [name]  Tobacco Use  . Smoking status: Current Every Day Smoker    Packs/day: 0.25    Years: 33.00    Pack years: 8.25    Types: Cigarettes  . Smokeless tobacco: Never Used  . Tobacco comment: 06/28/19 1/4 pack per day  Substance and Sexual Activity  . Alcohol use: No    Alcohol/week: 0.0 standard drinks    Comment: Rarely  . Drug use: No  . Sexual activity: Yes     Birth control/protection: None  Other Topics Concern  . Not on file  Social History Narrative   Lives at home w/ his step-mother   Right-handed   Drinks about 2-3 [place] a day   Social Determinants of Health   Financial Resource Strain:   . Difficulty of Paying Living Expenses: Not on file  Food Insecurity:   . Worried About Charity fundraiser in the Last Year: Not on file  . Ran Out of Food in the Last Year: Not on file  Transportation Needs:   . Lack of Transportation (Medical): Not on file  . Lack of Transportation (Non-Medical): Not on file  Physical Activity:   . Days of Exercise per Week: Not on file  . Minutes of Exercise per Session: Not on file  Stress:   . Feeling of Stress : Not on file  Social Connections:   . Frequency of Communication with Friends and Family: Not on file  . Frequency of Social Gatherings with Friends and Family: Not on file  . Attends Religious Services: Not on file  . Active Member of Clubs or Organizations: Not on file  . Attends Archivist Meetings: Not on file  . Marital Status: Not on file   Past Surgical History:  Procedure Laterality Date  . Irrigation and debridement of back abscess    . MASS EXCISION Right 06/14/2019   Procedure: EXCISION BENIGN LESION RIGHT FOOT;  Surgeon: [name], DPM;  Location: [facility];  Service: Podiatry;  Laterality: Right;  . NASAL SEPTOPLASTY W/ TURBINOPLASTY [name] 02/03/2018   Procedure: NASAL SEPTOPLASTY WITH TURBINATE REDUCTION;  Surgeon: [name], MD;  Location: [facility];  Service: ENT;  Laterality: [name];  . PILONIDAL CYST DRAINAGE [name] 04/10/2017   Procedure: IRRIGATION AND DEBRIDEMENT BACK ABSCESS;  Surgeon: [name], MD;  Location: [facility];  Service: General;  Laterality: [name];  . SINUS ENDO WITH FUSION [name] 02/03/2018   Procedure: ENDOSCOPIC SINUS SURGERY WITH FUSION;  Surgeon: [name], MD;  Location: [place];  Service: ENT;  Laterality: [name];  . THORACIC OUTLET SURGERY  2000  . TOE  ARTHROPLASTY Right 06/14/2019   Procedure: HALLUX ARTHROPLASTY RIGHT FOOT WITH TISSUE TRANSER EXCISION OF SESAMOID BONE;  Surgeon: [name], DPM;  [facility];  Service: Podiatry;  Laterality: Right;  . WRIST FUSION  2002   Past Medical History:  Diagnosis Date  . Anxiety   . Chronic back pain   . Degenerative disk disease   . Depression   . DVT of upper extremity (deep vein thrombosis) ([place])   . GERD (gastroesophageal reflux disease)   .  Hypertension   . MRSA (methicillin resistant staph aureus) culture positive   . Peripheral neuropathy   . Pilonidal cyst   . Pulmonary embolus ([place])    no blood thinner 2002  . Sleep apnea    can't use cpap due to deviated nasal septumg   Temp 97.7 F (36.5 C)   Ht 6\' 5"  (1.956 m)   Wt (!) 352 lb (159.7 kg)   BMI 41.74 kg/m   Opioid Risk Score:   Fall Risk Score:  `1  Depression screen PHQ 2/9  Depression screen [facility] 2/9 08/28/2019 06/28/2019 05/15/2019 04/25/2019 03/29/2019 03/28/2019 03/07/2019  Decreased Interest 0 0 1 0 0 0 2  Down, Depressed, Hopeless 1 0 1 0 0 0 1  PHQ - 2 Score 1 0 2 0 0 0 3  Altered sleeping - - 3 - - - 3  Tired, decreased energy - - 2 - - - 3  Change in appetite - - 1 - - - 2  Feeling bad or failure about yourself  - - 0 - - - 0  Trouble concentrating - - 2 - - - 1  Moving slowly or fidgety/restless - - 0 - - - -  Suicidal thoughts - - 0 - - - 0  PHQ-9 Score - - 10 - - - 12  Difficult doing work/chores - - Somewhat difficult - - - Very difficult  Some recent data might be hidden    Review of Systems  Constitutional: Negative.   HENT: Negative.   Eyes: Negative.   Respiratory: Positive for apnea and shortness of breath.   Cardiovascular: Positive for leg swelling.  Gastrointestinal: Negative.   Genitourinary: Negative.   Musculoskeletal: Positive for back pain.       Spasms   Allergic/Immunologic: Negative.   Neurological: Positive for tremors, weakness and numbness.       Tingling    Psychiatric/Behavioral: Positive for dysphoric mood. The patient is nervous/anxious.   All other systems reviewed and are negative.      Objective:   Physical Exam [id number] affect today, normal appearing HEENT: oral mucosa pink and moist, NCAT Cardio: Reg rate Chest: normal effort, normal rate of breathing Abd: soft, non-distended Ext: Significant lower extremity edema bilaterally with skin tears in right lower extremity, venous stasis.  Skin: intact Neuro/Musculoskeletal:AOx3. Normal gait. Moves all extremities antigravity.Left knee with swelling, nontender to palpation.  Psych: pleasant, flat affect     Assessment & Plan:  Mr. Kimpson is a 49 year old man with chronic pain who is here for refill of his chronic pain medications.  --His bilateral knee and ankle pain continues to be severe and he is developing tolerance to his Percocet. I educated him that this is to be expected and that Percocet is not recommended long term for chronic pain as the body develops tolerance to it and chronic pain can worsen as a result. He understood. I told him I will refill his prescription but not increase his dose, and he was agreeable. He plans to have knee surgery once he has recovered from ankle surgery and is able to return to work and thus afford knee surgery. I discussed that we could try to find alternative, less addictive agents in the meantime to provide him a better quality of life and for better pain control. He asks about CBD oil, and I recommend this as an adjuvant.   --He is unable to exercise beyond his ankle PT due to his pain. He knows that his  obesity is a large component of his pain due to the excess weight it puts on his joints. He wants to lose weight and pursue a gastric bypass once he can afford this. He has made significant strides in his diet and I commended him for this. As a result he was able to get off HCTZ and his blood pressure is controlled with 5mg  of amlodipine.  Today his BP in office has systolic 0000000 and he asks for refill of his amlodipine as his PCP Mina Marble is leaving the practice. He has been having lower extremity edema and amlodipine can contribute to lower extremity edema. Advised that he stop this medication. He is also having orthopnea at night and would benefit from cardiology evaluation for heart failure. He has seen a cardiologist in the past but cannot remember their name. I have provided a referral for him. I have also prescribed compression stockings.   --Lyrica is controlling his neuropathic pain well. I told him we can not increase the dose further since he is already at the max dose of 600mg  daily. Advised of neurotoxic affects and lack of benefit with higher dose. I also advised that Lyrica can cause weight gain and may be a reason he is having difficulty losing weight despite his improved diet.   --He is also having insomnia exacerbated by the need to urinate every 1-1.5 hours. He has had his prostate checked and was told it is not enlarged. He is not on any medications that should increase urinary frequency. He also reports depression and anxiety. I recommended amitriptyline at night to help with depression, insomnia, and neuropathic pain. It also has the side effect of urinary retention which could be helpful in decreasing Mr. Suire urinary frequency. Unfortunately, he urinated over himself after taking this medication since it helped him sleep too well. Can consider lower dose in the future but he is not amenable to this at this time.    --He was thankful for the time spent with him and positive regarding our treatment plan. Ultimately our goal will be to improve his quality of life, help him return to work, help him to return to walking, get gastric bypass surgery and bilateral knee surgery, aid in weight loss and help him to sleep better, and help to treat his depression and anxiety. I have prescribed Lexapro 5mg  to help him with  anxiety and hopefully to allow him to better tolerate his CPAP at night. His affect does appear much brighter at today's appointment. We will utilize the biopsychosocial model to help achieve these goals.I completed form for his work previously. RTC in 4 weeks.

## 2019-12-28 ENCOUNTER — Telehealth: Payer: Self-pay

## 2019-12-28 NOTE — Telephone Encounter (Signed)
Yes that would be no problem!

## 2019-12-28 NOTE — Telephone Encounter (Signed)
Patient last filled Oxycodone/APAP on 12/04/2019. He wanted to know if he could fill his prescription tomorrow because his pharmacy is closed over the weekend?

## 2019-12-29 NOTE — Telephone Encounter (Signed)
Pharmacy and patient notified.

## 2019-12-31 NOTE — Progress Notes (Signed)
Cardiology Office Note:    Date:  01/01/2020   ID:  Eduardo [name], DOB 02/25/1971, MRN [id number]  PCP:  [name], NP  Cardiologist:  Buford Dresser, MD  Electrophysiologist:  None   Referring MD: [name], MD   Chief Complaint  Patient presents with  . Chest Pain    History of Present Illness:    Eduardo [name] is a 49 y.o. male with a hx of chronic pain, hypertension, anxiety/depression, tobacco use, upper extremity DVT, GERD, PE, OSA who is referred by Dr. [name] for evaluation of lower extremity edema and orthopnea.  He was seen by Dr.[name] with the symptoms on 3/10.  Amlodipine use was suspected as the cause of his lower extremity edema and was discontinued.  He was previously seen by cardiology, Dr. [name], on 06/02/2018.  He had been referred for shortness of breath.  TTE was done on 06/13/2018, which showed no abnormalities.  He reports that he has been having lower extremity edema for about 1 year.  Recently stopped amlodipine and does report some improvement.  Has not been monitoring his blood pressure since he stopped amlodipine.  Also reports dyspnea, particularly when lying flat.  Reports intermittent chest tightness with exertion.  States only occurs with significant exertion, lasts less than a minute and resolves with rest.  Reports intermittent lightheadedness.  Denies any syncope or palpitations. Reports weight has been stable.  Smokes 1 pack/day, has smoked for 40 years.  No history of heart disease in his immediate family.    Past Medical History:  Diagnosis Date  . Anxiety   . Chronic back pain   . Degenerative disk disease   . Depression   . DVT of upper extremity (deep vein thrombosis) ([place])   . GERD (gastroesophageal reflux disease)   . Hypertension   . MRSA (methicillin resistant staph aureus) culture positive   . Peripheral neuropathy   . Pilonidal cyst   . Pulmonary embolus ([place])    no blood thinner 2002  .  Sleep apnea    can't use cpap due to deviated nasal septumg    Past Surgical History:  Procedure Laterality Date  . Irrigation and debridement of back abscess    . MASS EXCISION Right 06/14/2019   Procedure: EXCISION BENIGN LESION RIGHT FOOT;  Surgeon: [name], DPM;  Location: [facility];  Service: Podiatry;  Laterality: Right;  . NASAL SEPTOPLASTY W/ TURBINOPLASTY [name] 02/03/2018   Procedure: NASAL SEPTOPLASTY WITH TURBINATE REDUCTION;  Surgeon: [name], MD;  Location: [facility];  Service: ENT;  Laterality: [name];  . PILONIDAL CYST DRAINAGE [name] 04/10/2017   Procedure: IRRIGATION AND DEBRIDEMENT BACK ABSCESS;  Surgeon: [name], MD;  Location: [facility];  Service: General;  Laterality: [name];  . SINUS ENDO WITH FUSION [name] 02/03/2018   Procedure: ENDOSCOPIC SINUS SURGERY WITH FUSION;  Surgeon: [name], MD;  Location: [facility];  Service: ENT;  Laterality: [name];  . THORACIC OUTLET SURGERY  2000  . TOE ARTHROPLASTY Right 06/14/2019   Procedure: HALLUX ARTHROPLASTY RIGHT FOOT WITH TISSUE TRANSER EXCISION OF SESAMOID BONE;  Surgeon: [name], DPM;  Location: [facility];  Service: Podiatry;  Laterality: Right;  . WRIST FUSION  2002    Current Medications: Current Meds  Medication Sig  . Acetaminophen (TYLENOL ARTHRITIS PAIN PO) Take 600 mg by mouth 3 (three) times daily.  [name] escitalopram (LEXAPRO) 5 MG tablet Take 1 tablet (5 mg total) by mouth daily.  . fluticasone (FLONASE) 50  MCG/ACT nasal spray SPRAY 2 SPRAYS IN EACH NOSTRIL DAILY  . modafinil (PROVIGIL) 100 MG tablet Take 1 tablet (100 mg total) by mouth daily.  . montelukast (SINGULAIR) 10 MG tablet Take 1 tablet (10 mg total) by mouth at bedtime.  . Nutritional Supplements (JUICE PLUS FIBRE PO) Take 4 each by mouth daily with lunch.  . oxyCODONE-acetaminophen (PERCOCET) 7.5-325 MG tablet Take 1 tablet by mouth every 6 (six) hours as needed.  . pregabalin (LYRICA) 300 MG capsule Take 1 capsule (300 mg total) by mouth 2 (two) times  daily.  [name] sulfamethoxazole-trimethoprim (BACTRIM DS) 800-160 MG tablet Take 1 tablet by mouth 2 (two) times daily.     Allergies:   Pollen extract, Dust mite extract, and Other   Social History   Socioeconomic History  . Marital status: Divorced    Spouse name: Not on file  . Number of children: 2  . Years of education: 12th   . Highest education level: Not on file  Occupational History  . Occupation: [name]  Tobacco Use  . Smoking status: Current Every Day Smoker    Packs/day: 0.25    Years: 33.00    Pack years: 8.25    Types: Cigarettes  . Smokeless tobacco: Never Used  . Tobacco comment: 06/28/19 1/4 pack per day  Substance and Sexual Activity  . Alcohol use: No    Alcohol/week: 0.0 standard drinks    Comment: Rarely  . Drug use: No  . Sexual activity: Yes    Birth control/protection: None  Other Topics Concern  . Not on file  Social History Narrative   Lives at home w/ his step-mother   Right-handed   Drinks about 2-3 [place] a day   Social Determinants of Health   Financial Resource Strain:   . Difficulty of Paying Living Expenses:   Food Insecurity:   . Worried About Charity fundraiser in the Last Year:   . Arboriculturist in the Last Year:   Transportation Needs:   . Film/video editor (Medical):   [name] Lack of Transportation (Non-Medical):   Physical Activity:   . Days of Exercise per Week:   . Minutes of Exercise per Session:   Stress:   . Feeling of Stress :   Social Connections:   . Frequency of Communication with Friends and Family:   . Frequency of Social Gatherings with Friends and Family:   . Attends Religious Services:   . Active Member of Clubs or Organizations:   . Attends Archivist Meetings:   [name] Marital Status:      Family History: The patient's family history includes Cancer in his maternal grandmother; Diabetes in his mother; Heart disease in his father; Hypertension in his father and mother; Stroke in his paternal  grandfather. There is no history of Neuropathy.  ROS:   Please see the history of present illness.     All other systems reviewed and are negative.  EKGs/Labs/Other Studies Reviewed:    The following studies were reviewed today:   EKG:  EKG is ordered today.  The ekg ordered today demonstrates normal sinus rhythm, rate 75, no ST/T abnormalities  TTE 06/13/18: - Left ventricle: The cavity size was normal. There was mild  concentric hypertrophy. Systolic function was normal. The  estimated ejection fraction was in the range of 60% to 65%. Wall  motion was normal; there were no regional wall motion  abnormalities. Left ventricular diastolic function parameters  were normal.  -  Aortic valve: Transvalvular velocity was within the normal range.  There was no stenosis. There was no regurgitation.  - Mitral valve: Transvalvular velocity was within the normal range.  There was no evidence for stenosis. There was trivial  regurgitation.  - Right ventricle: The cavity size was normal. Wall thickness was  normal. Systolic function was normal.  - Atrial septum: No defect or patent foramen ovale was identified  by color flow Doppler.  - Tricuspid valve: There was trivial regurgitation.  - Pulmonary arteries: Systolic pressure was within the normal  range. PA peak pressure: 16 mm Hg (S).  - Global longitudinal strain -21.6% (normal).   Recent Labs: 06/12/2019: Hemoglobin 14.0; Platelets 189 09/06/2019: BUN 14; Creat 1.15; Potassium 4.3; Sodium 138  Recent Lipid Panel    Component Value Date/Time   CHOL 119 09/28/2018 0841   TRIG 276 (H) 09/28/2018 0841   HDL 25 (L) 09/28/2018 0841   CHOLHDL 4.8 09/28/2018 0841   CHOLHDL 4.5 10/25/2014 1124   VLDL 36 10/25/2014 1124   LDLCALC 39 09/28/2018 0841    Physical Exam:    VS:  BP 116/74   Pulse 75   Temp 97.7 F (36.5 C)   Ht 6\' 4"  (1.93 m)   Wt (!) 356 lb (161.5 kg)   SpO2 96%   BMI 43.33 kg/m     Wt  Readings from Last 3 Encounters:  01/01/20 (!) 356 lb (161.5 kg)  12/27/19 (!) 352 lb (159.7 kg)  11/29/19 (!) 350 lb (158.8 kg)     GEN: in no acute distress HEENT: Normal NECK: No JVD CARDIAC: RRR, no murmurs, rubs, gallops RESPIRATORY:  Clear to auscultation without rales, wheezing or rhonchi  ABDOMEN: Soft, non-tender, non-distended MUSCULOSKELETAL:  No edema; No deformity  SKIN: Warm and dry NEUROLOGIC:  Alert and oriented x 3 PSYCHIATRIC:  Normal affect   ASSESSMENT:    1. Chest pain, unspecified type   2. Lower extremity edema   3. Essential hypertension   4. OSA (obstructive sleep apnea)   5. Tobacco use    PLAN:    Chest pain: description concerning for typical angina.  Does have significant CAD risk factors (age, hypertension, tobacco use) - Coronary CTA  Lower extremity edema/orthopnea: Improving since stopping amlodipine.  Check TTE to evaluate systolic/diastolic function  Hypertension: Recently stopped his amlodipine due to edema.  BP mildly elevated in clinic today, asked to check BP daily and call in next 2 week swith results  OSA: Has not been able to tolerate CPAP.  Compliance with CPAP strongly encouraged  Tobacco use: smokes 1ppd.  Cessation strongly encouraged.  RTC in 3 months   Medication Adjustments/Labs and Tests Ordered: Current medicines are reviewed at length with the patient today.  Concerns regarding medicines are outlined above.  Orders Placed This Encounter  Procedures  . CT CORONARY MORPH W/CTA COR W/SCORE W/CA W/CM &/OR WO/CM  . CT CORONARY FRACTIONAL FLOW RESERVE DATA PREP  . CT CORONARY FRACTIONAL FLOW RESERVE FLUID ANALYSIS  . EKG 12-Lead  . ECHOCARDIOGRAM COMPLETE   Meds ordered this encounter  Medications  . metoprolol tartrate (LOPRESSOR) 100 MG tablet    Sig: Take 100 mg (1 tablet) TWO hours prior to CT    Dispense:  1 tablet    Refill:  0    Patient Instructions  Medication Instructions:  Your physician recommends  that you continue on your current medications as directed. Please refer to the Current Medication list given to you today.  Take  metoprolol tartrate (Lopressor) 100 mg TWO HOURS PRIOR TO CT  *If you need a refill on your cardiac medications before your next appointment, please call your pharmacy*   Lab Work: NONE  Testing/Procedures: Your physician has requested that you have an echocardiogram. Echocardiography is a painless test that uses sound waves to create images of your heart. It provides your doctor with information about the size and shape of your heart and how well your heart's chambers and valves are working. This procedure takes approximately one hour. There are no restrictions for this procedure. This will be done at our [facility] location:  [place] has requested that you have cardiac CT. Cardiac computed tomography (CT) is a painless test that uses an x-ray machine to take clear, detailed pictures of your heart. For further information please visit HugeFiesta.[email or web address]. Please follow instruction sheet as given.   Follow-Up: At [facility], you and your health needs are our priority.  As part of our continuing mission to provide you with exceptional heart care, we have created designated Provider Care Teams.  These Care Teams include your primary Cardiologist (physician) and Advanced Practice Providers (APPs -  Physician Assistants and Nurse Practitioners) who all work together to provide you with the care you need, when you need it.  We recommend signing up for the patient portal called "MyChart".  Sign up information is provided on this After Visit Summary.  MyChart is used to connect with patients for Virtual Visits (Telemedicine).  Patients are able to view lab/test results, encounter notes, upcoming appointments, etc.  Non-urgent messages can be sent to your provider as well.   To learn more about what you can do with MyChart, go to  [email or web address].    Your next appointment:   3 month(s)  The format for your next appointment:   In Person  Provider:   Oswaldo Milian, MD   Other Instructions Check blood pressure daily and write it down-call in 2 weeks with readings     Signed, Donato Heinz, MD  01/01/2020 5:38 PM    [place]

## 2020-01-01 ENCOUNTER — Ambulatory Visit: Payer: BC Managed Care – PPO | Admitting: Cardiology

## 2020-01-01 ENCOUNTER — Encounter: Payer: Self-pay | Admitting: Cardiology

## 2020-01-01 ENCOUNTER — Other Ambulatory Visit: Payer: Self-pay

## 2020-01-01 VITALS — BP 116/74 | HR 75 | Temp 97.7°F | Ht 76.0 in | Wt 356.0 lb

## 2020-01-01 DIAGNOSIS — R600 Localized edema: Secondary | ICD-10-CM

## 2020-01-01 DIAGNOSIS — I10 Essential (primary) hypertension: Secondary | ICD-10-CM | POA: Diagnosis not present

## 2020-01-01 DIAGNOSIS — Z720 Tobacco use: Secondary | ICD-10-CM

## 2020-01-01 DIAGNOSIS — G4733 Obstructive sleep apnea (adult) (pediatric): Secondary | ICD-10-CM | POA: Diagnosis not present

## 2020-01-01 DIAGNOSIS — R079 Chest pain, unspecified: Secondary | ICD-10-CM | POA: Diagnosis not present

## 2020-01-01 MED ORDER — METOPROLOL TARTRATE 100 MG PO TABS: ORAL_TABLET | ORAL | 0 refills | Status: DC

## 2020-01-01 NOTE — Patient Instructions (Signed)
Medication Instructions:  Your physician recommends that you continue on your current medications as directed. Please refer to the Current Medication list given to you today.  Take metoprolol tartrate (Lopressor) 100 mg TWO HOURS PRIOR TO CT  *If you need a refill on your cardiac medications before your next appointment, please call your pharmacy*   Lab Work: NONE  Testing/Procedures: Your physician has requested that you have an echocardiogram. Echocardiography is a painless test that uses sound waves to create images of your heart. It provides your doctor with information about the size and shape of your heart and how well your heart's chambers and valves are working. This procedure takes approximately one hour. There are no restrictions for this procedure. This will be done at our [facility] location:  [place] has requested that you have cardiac CT. Cardiac computed tomography (CT) is a painless test that uses an x-ray machine to take clear, detailed pictures of your heart. For further information please visit [email or web address].[email or web address]. Please follow instruction sheet as given.   Follow-Up: At [facility], you and your health needs are our priority.  As part of our continuing mission to provide you with exceptional heart care, we have created designated Provider Care Teams.  These Care Teams include your primary Cardiologist (physician) and Advanced Practice Providers (APPs -  Physician Assistants and Nurse Practitioners) who all work together to provide you with the care you need, when you need it.  We recommend signing up for the patient portal called "MyChart".  Sign up information is provided on this After Visit Summary.  MyChart is used to connect with patients for Virtual Visits (Telemedicine).  Patients are able to view lab/test results, encounter notes, upcoming appointments, etc.  Non-urgent messages can be sent to your provider as well.   To  learn more about what you can do with MyChart, go to [email or web address].    Your next appointment:   3 month(s)  The format for your next appointment:   In Person  Provider:   Oswaldo Milian, MD   Other Instructions Check blood pressure daily and write it down-call in 2 weeks with readings

## 2020-01-02 ENCOUNTER — Telehealth: Payer: Self-pay | Admitting: Licensed Clinical Social Worker

## 2020-01-02 NOTE — Telephone Encounter (Signed)
CSW referred to assist patient with obtaining a BP cuff. CSW contacted patient to inform cuff will be delivered to home. Patient grateful for support and assistance. CSW available as needed. Jackie Lilo [place], LCSW, CCSW-MCS [phone]  

## 2020-01-16 ENCOUNTER — Encounter: Payer: Self-pay | Admitting: Family Medicine

## 2020-01-16 ENCOUNTER — Ambulatory Visit (INDEPENDENT_AMBULATORY_CARE_PROVIDER_SITE_OTHER): Payer: BC Managed Care – PPO | Admitting: Family Medicine

## 2020-01-16 ENCOUNTER — Other Ambulatory Visit: Payer: Self-pay

## 2020-01-16 DIAGNOSIS — J3089 Other allergic rhinitis: Secondary | ICD-10-CM

## 2020-01-16 DIAGNOSIS — J302 Other seasonal allergic rhinitis: Secondary | ICD-10-CM

## 2020-01-16 DIAGNOSIS — Z716 Tobacco abuse counseling: Secondary | ICD-10-CM

## 2020-01-16 DIAGNOSIS — I10 Essential (primary) hypertension: Secondary | ICD-10-CM | POA: Diagnosis not present

## 2020-01-16 DIAGNOSIS — F17200 Nicotine dependence, unspecified, uncomplicated: Secondary | ICD-10-CM | POA: Diagnosis not present

## 2020-01-16 DIAGNOSIS — Z9989 Dependence on other enabling machines and devices: Secondary | ICD-10-CM

## 2020-01-16 DIAGNOSIS — G4733 Obstructive sleep apnea (adult) (pediatric): Secondary | ICD-10-CM

## 2020-01-16 MED ORDER — VARENICLINE TARTRATE 1 MG PO TABS: 1.0000 mg | ORAL_TABLET | Freq: Two times a day (BID) | ORAL | 0 refills | Status: DC

## 2020-01-16 MED ORDER — MONTELUKAST SODIUM 10 MG PO TABS: 10.0000 mg | ORAL_TABLET | Freq: Every day | ORAL | 0 refills | Status: DC

## 2020-01-16 MED ORDER — FLUTICASONE PROPIONATE 50 MCG/ACT NA SUSP: NASAL | 2 refills | Status: DC

## 2020-01-16 MED ORDER — CHANTIX STARTING MONTH PAK 0.5 MG X 11 & 1 MG X 42 PO TABS: ORAL_TABLET | ORAL | 0 refills | Status: DC

## 2020-01-16 NOTE — Progress Notes (Signed)
Impression and Recommendations:    1. Morbid obesity ([place])   2. OSA on CPAP- which he never uses   3. Hypertension, unspecified type   4. TOBACCO ABUSE   5. Environmental and seasonal allergies   6. Seasonal allergic rhinitis, unspecified trigger   7. Tobacco abuse counseling       Of note, this is my first time meeting patient.  Patient is new to me and was previously being cared for at our office by Mina Marble, NP, who no longer works at primary care [employer].    Environmental & Seasonal Allergies, Seasonal Allergic Rhinitis - Per patient, symptoms controlled on treatment plan. - Refills for Singulair and Flonase provided today.  - Advised the patient to begin using AYR or Neilmed sinus rinses BID followed by flonase BID (one spray to each nostril).  -  Continue management as established.  - Will continue to monitor.   OSA on CPAP - which he never uses - Per patient, sleeps poorly and continues to be unable to tolerate wearing mask at night. - Advised patient to follow up with Dr. Halford Chessman regarding concerns related to CPAP.  - Will continue to monitor alongside specialist.   Hypertension - Stable at this time, BP well-controlled. - Per patient, blood pressure medication was discontinued through [facility] specialist.  - Discussed that patient's BP is at goal at this time, under 120/80. - Continue management as established.  - Encouraged patient to check his BP at home, at least 3 times per week. - If patient's blood pressure begins to shift, he knows to advise his specialists immediately.  - Will continue to monitor alongside other specialists.   Tobacco Abuse - Tobacco Abuse Counseling - Per patient, ready to quit because "I want to be able to breathe and lose weight."  - Told pt to think seriously about quitting smoking!  Told pt it is very important for his health and well being.   - Smoking cessation instruction/ counseling given of at least 5 minutes:   counseled patient on the dangers of tobacco use and reviewed strategies to maximize success  -  Discussed use of Chantix for smoking cessation today. - Starter pack and refill of continuing pack provided today.  See med list.  - Discussed with patient that there are multiple treatments to aid in quitting smoking, however I explained none will work unless pt really wants to quit.  - Advised patient to go on www.Chantix.com to set a quit date and figure out his "why" for quitting smoking.  Encouraged patient to change his habits daily.  - Told to call 1-800-QUIT-NOW (980)250-9045) for free smoking cessation counseling and support, or pt can go online to www.heart.org - the American Heart Association website and search "quit smoking ".     BMI Counseling - Body mass index is 42.47 kg/m, Morbid Obesity Explained to patient what BMI refers to, and what it means medically.    Told patient to think about it as a "medical risk stratification measurement" and how increasing BMI is associated with increasing risk/ or worsening state of various diseases such as hypertension, hyperlipidemia, diabetes, premature OA, depression etc.  American Heart Association guidelines for healthy diet, basically Mediterranean diet, and exercise guidelines of 30 minutes 5 days per week or more discussed in detail.  - STRONGLY advised patient to quit drinking ANY caloric beverages before next OV  - Discussed importance of deciding and wanting to make a change to improve chronic  habits.  If desired, discussed referral to programs such as [facility] for assistance with weight loss.  - Health counseling performed.  All questions answered.   Health Counseling & Preventative Maintenance - Advised patient to continue working toward exercising to improve overall mental, physical, and emotional health.    - Reviewed the "spokes of the wheel" of mood and health management.  Stressed the importance of  ongoing prudent habits, including regular exercise, appropriate sleep hygiene, healthful dietary habits, and prayer/meditation to relax.  - Encouraged patient to engage in daily physical activity as tolerated, especially a formal exercise routine.  Recommended that the patient eventually strive for at least 150 minutes of moderate cardiovascular activity per week according to guidelines established by the [facility].   - Healthy dietary habits encouraged, including low-carb, and high amounts of lean protein in diet.   - Patient should also consume adequate amounts of water.   Recommendations - Need for CPE and full fasting lab work same day in 1-2 months.    Meds ordered this encounter  Medications  . montelukast (SINGULAIR) 10 MG tablet    Sig: Take 1 tablet (10 mg total) by mouth at bedtime.    Dispense:  90 tablet    Refill:  0  . fluticasone (FLONASE) 50 MCG/ACT nasal spray    Sig: 1 spray each nostril twice daily after sinus rinses    Dispense:  16 g    Refill:  2    Maximum Refills Reached  . varenicline (CHANTIX STARTING MONTH PAK) 0.5 MG X 11 & 1 MG X 42 tablet    Sig: Take one 0.5mg  tablet by mouth once daily for 3 days, then increase to one 0.5mg  tablet twice daily for 3 days, then increase to one 1mg  tablet twice daily.    Dispense:  53 tablet    Refill:  0  . varenicline (CHANTIX) 1 MG tablet    Sig: Take 1 tablet (1 mg total) by mouth 2 (two) times daily.    Dispense:  180 tablet    Refill:  0    Medications Discontinued During This Encounter  Medication Reason  . metoprolol tartrate (LOPRESSOR) 100 MG tablet   . modafinil (PROVIGIL) 100 MG tablet   . montelukast (SINGULAIR) 10 MG tablet Reorder  . fluticasone (FLONASE) 50 MCG/ACT nasal spray Reorder      Please see AVS handed out to patient at the end of our visit for further patient instructions/ counseling done pertaining to today's office visit.   Return ( started chantix- f/up on that as well during CPE), for  CPE and full fasting lab work same day in 1-2 months with new provider.     Note:  This note was prepared with assistance of Dragon voice recognition software. Occasional wrong-word or sound-a-like substitutions may have occurred due to the inherent limitations of voice recognition software.   The [place] was signed into law in 2016 which includes the topic of electronic health records.  This provides immediate access to information in MyChart.  This includes consultation notes, operative notes, office notes, lab results and pathology reports.  If you have any questions about what you read please let us know at your next visit or call us at the office.  We are right here with you.   This case required medical decision making of at least moderate complexity.  This document serves as a record of services personally performed by Mellody Dance, DO. It was  created on her behalf by Toni Amend, a trained medical scribe. The creation of this record is based on the scribe's personal observations and the provider's statements to them.    The above documentation from Toni Amend, medical scribe, has been reviewed by Marjory Sneddon, D.O. --------------------------------------------------------------------------------------------------------------------------------------------------------------------------------------------------------------------------------------------    Subjective:     Phillips Odor, am serving as scribe for Dr.Maribeth Jiles.   HPI: MORIO DUKART is a 49 y.o. male who presents to [place] [facility] today for issues as discussed below.  Believes he has been managed here at the clinic for his blood pressure in the past, but notes his pain management doctor ([facility]) took him off of blood pressure medicine, because his blood pressure had been so good lately.  - Sleep Apnea Sees Dr. Halford Chessman for sleep apnea.  He has a CPAP,  but states he can't wear his mask at all because he "almost choked to death before a long time ago."  Notes in the past, "both arteries were blocked in my arm, and I had a blood clot from my wrist up to my neck, and it broke off and went into my lungs."  Says "if I have any kind of back pressure when I'm breathing, I freak out."  Says they've "tried every mask, I mean everything, and I can't deal with it."  - Morbid Obesity States he has been trying to lose weight, and is open to assistance.  About a year ago, he consulted about the option of bariatric surgery, but notes he has to collect a bit more money and quit smoking before he could go through with this procedure.  - Tobacco Abuse, Current Smoker Was smoking about a pack per day in the past, but notes "it seems like when I try to quit, I smoke more."  He's up to over a pack per day now.  Says "quitting smoking is the hardest thing I've ever tried to do."  Per patient, wishes to see about trying Chantix again.  Denies side effects on Chantix in the past.  In the past, using Chantix, "I went through the first pack, and they accidentally sent the same pack again instead of the follow-up pack, and it messed me up."  Feels that if he gets the first pack and then the follow-up pack, he may have a better chance with quitting.  States he's also used patches in the past.  Patient states he wishes to quit smoking because "I want to be able to breathe and lose weight."  - Environmental & Seasonal Allergies Notes he laid off of his allergy medication over the winter, and then the pollen hit him the other day.  Says he uses sinus rinses and Q-tips "and everything to clean my nose."  - Infectious Disease Sees Dr. Tommy Medal for history of MRSA.  - Cardiac Care Recently established with Dr. [name] of cardiology.  - Urology Concerns While discussing kidney health, patient notes he has trouble with fluid building up in his lower legs, and  followed up with urologist in the past and was told everything was fine.  Says "I have problems at night."  Says "when I actually do fall asleep, I end up peeing on myself."  Says "one reason I don't sleep very well is because I worry about doing that."  HPI:  Hypertension:  Notes his blood pressure at home has been running under 120/80 at home.  - Patient reports good compliance with medication and/or lifestyle  modification  - His denies acute concerns or problems related to treatment plan  - He denies new onset of: chest pain, exercise intolerance, shortness of breath, dizziness, visual changes, headache, lower extremity swelling or claudication.   Last 3 blood pressure readings in our office are as follows: BP Readings from Last 3 Encounters:  01/16/20 119/77  01/01/20 116/74  12/27/19 128/82   Filed Weights   01/16/20 1324  Weight: (!) 348 lb 14.4 oz (158.3 kg)    Wt Readings from Last 3 Encounters:  01/16/20 (!) 348 lb 14.4 oz (158.3 kg)  01/01/20 (!) 356 lb (161.5 kg)  12/27/19 (!) 352 lb (159.7 kg)   BP Readings from Last 3 Encounters:  01/16/20 119/77  01/01/20 116/74  12/27/19 128/82   Pulse Readings from Last 3 Encounters:  01/16/20 93  01/01/20 75  12/27/19 78   BMI Readings from Last 3 Encounters:  01/16/20 42.47 kg/m  01/01/20 43.33 kg/m  12/27/19 41.74 kg/m     Patient Care Team    Relationship Specialty Notifications Start End  Mina Marble D, NP PCP - General Family Medicine  03/03/17   Buford Dresser, MD PCP - Cardiology Cardiology Admissions 07/18/18   Allyn Kenner, MD  Dermatology  04/06/17   Charlett Blake, MD Consulting Physician Physical Medicine and Rehabilitation  11/11/17   Newt Minion, MD Consulting Physician Orthopedic Surgery  07/17/19   [name], MD Consulting Physician Physical Medicine and Rehabilitation  01/16/20   Chesley Mires, MD Consulting Physician Pulmonary Disease  01/16/20    Comment: OSA  [name], DPM Consulting Physician Podiatry  01/16/20   Tommy Medal, Lavell Islam, MD Consulting Physician Infectious Diseases  01/16/20    Comment: MRSA infection     Patient Active Problem List   Diagnosis Date Noted  . Ulcer of great toe, right, with fat layer exposed ([place])   . Surgery, elective 05/16/2019  . Acute idiopathic gout of multiple sites 03/23/2019  . Recurrent infections 03/07/2019  . Blepharitis of right upper eyelid 03/07/2019  . Body mass index 40.0-44.9, adult ([place]) 04/19/2018  . Pain in left foot 04/19/2018  . Shortness of breath 04/07/2018  . Lower extremity edema 04/07/2018  . Stasis dermatitis of both legs 04/07/2018  . Hypokalemia 04/07/2018  . Healthcare maintenance 04/07/2018  . Great toe pain, left 03/08/2018  . Acute gout involving toe of left foot 03/08/2018  . Low libido 02/09/2018  . OSA on CPAP 02/03/2018  . Status post nasal septoplasty 02/03/2018  . Chronic pansinusitis 12/07/2017  . Hypertrophy, nasal, turbinate 12/07/2017  . Deviated septum 11/11/2017  . Umbilical hernia without obstruction and without gangrene 11/11/2017  . Scrotal pain 11/11/2017  . Scrotal cyst 11/11/2017  . Screening, lipid 11/11/2017  . Screening for diabetes mellitus (DM) 11/11/2017  . Screening for thyroid disorder 11/11/2017  . Anxiety and depression 08/11/2017  . Acute maxillary sinusitis 08/11/2017  . 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 Lumbar degenerative disc disease 08/02/2017  . Acute left ankle pain 07/12/2017  . Skin infection 06/03/2017  . Chronic folliculitis [id number]  . [place] 04/10/2017  . Cellulitis and abscess of trunk s/p I&D 04/10/2017 04/06/2017  . History of MRSA infection 04/06/2017  . Physical exam, annual 03/03/2017  . Neuropathy 09/28/2015  . Major depression 12/26/2012  . Generalized anxiety disorder 12/26/2012  . Opiate dependence ([place]) 12/24/2012  . Benzodiazepine dependence ([place]) 12/24/2012  . HTN (hypertension)  12/24/2012  . Pilonidal cyst 11/22/2012  .  Morbid obesity ([place]) 09/09/2010  . TOBACCO ABUSE 09/09/2010  . LOW BACK PAIN, CHRONIC 09/09/2010    Past Medical history, Surgical history, Family history, Social history, Allergies and Medications have been entered into the medical record, reviewed and changed as needed.    Current Meds  Medication Sig  . Acetaminophen (TYLENOL ARTHRITIS PAIN PO) Take 600 mg by mouth 3 (three) times daily.  [name] escitalopram (LEXAPRO) 5 MG tablet Take 1 tablet (5 mg total) by mouth daily.  . fluticasone (FLONASE) 50 MCG/ACT nasal spray 1 spray each nostril twice daily after sinus rinses  . montelukast (SINGULAIR) 10 MG tablet Take 1 tablet (10 mg total) by mouth at bedtime.  . Nutritional Supplements (JUICE PLUS FIBRE PO) Take 4 each by mouth daily with lunch.  . oxyCODONE-acetaminophen (PERCOCET) 7.5-325 MG tablet Take 1 tablet by mouth every 6 (six) hours as needed.  . pregabalin (LYRICA) 300 MG capsule Take 1 capsule (300 mg total) by mouth 2 (two) times daily.  [name] sulfamethoxazole-trimethoprim (BACTRIM DS) 800-160 MG tablet Take 1 tablet by mouth 2 (two) times daily.  . [DISCONTINUED] fluticasone (FLONASE) 50 MCG/ACT nasal spray SPRAY 2 SPRAYS IN EACH NOSTRIL DAILY  . [DISCONTINUED] montelukast (SINGULAIR) 10 MG tablet Take 1 tablet (10 mg total) by mouth at bedtime.    Allergies:  Allergies  Allergen Reactions  . Pollen Extract Cough  . Dust Mite Extract Other (See Comments)  . Other Other (See Comments)    Ragweed      Review of Systems:  A fourteen system review of systems was performed and found to be positive as per HPI.   Objective:   Blood pressure 119/77, pulse 93, temperature 98.7 F (37.1 C), temperature source Oral, resp. rate 16, height 6\' 4"  (1.93 m), weight (!) 348 lb 14.4 oz (158.3 kg), SpO2 95 %. Body mass index is 42.47 kg/m. General:  Well Developed, well nourished, appropriate for stated age.  Neuro:  Alert and oriented,   extra-ocular muscles intact  HEENT:  Normocephalic, atraumatic, neck supple, no carotid bruits appreciated  Skin:  no gross rash, warm, pink. Cardiac:  RRR, S1 S2 Respiratory:  ECTA B/L and A/P, Not using accessory muscles, speaking in full sentences- unlabored. Vascular:  Ext warm, no cyanosis apprec.; cap RF less 2 sec. Psych:  No HI/SI, judgement and insight good, Euthymic mood. Full Affect.

## 2020-01-16 NOTE — Patient Instructions (Addendum)
Dr. Jenetta Downer challenges you not to drink anything of calories.  Try to eliminate sugared beverages such as soda!   Please go online to www.[email or web address] and read tips and tricks to help you quit.  You will be much more successful if you are emotionally invested in it and read about how to be most successful on Chantix versus not     You can also call 1-800-QUIT-NOW 605 529 3451) for free smoking cessation counseling and support.     Also, please go online to www.heart.org (the American Heart Association website) and search "quit smoking ".     Or try the centers for disease control website at: https://www.schmidt.com/  Or, go to the "national cancer institute" web site of NIH:  http://benson.com/   There is a lot of great information on these websites for you to look over.     Want to Quit Smoking? FDA-Approved Products Can Help  Quitting smoking can be hard, but it is possible. In fact, every time you put out a cigarette is a new chance to try quitting again, according to the U.S. Food and Drug Administration's newest tobacco education campaign, "Every Try Counts."   If you want to quit--almost 70 percent of adult smokers say they do--you may want to use a "smoking cessation" product proven to help. Data has shown that using FDA-approved cessation medicine can double your chance of quitting successfully.  Some products contain nicotine as an active ingredient and others do not. These products include over-the-counter (OTC) options like skin patches, lozenges, and gum, as well as prescription medicines.  Smoking cessation products are intended to help you quit smoking. They are regulated through the [facility], which ensures that the products are safe and effective and that their benefits outweigh any known associated risks.  The Benefits of Quitting Smoking No matter  how much you smoke--or for how long--quitting will benefit you.  Not only will you lower your risk of getting various cancers, including lung cancer, you'll also reduce your chances of having heart disease, a stroke, emphysema, and other serious diseases. Quitting also will lower the risk of heart disease and lung cancer in nonsmokers who otherwise would be exposed to your secondhand smoke.  Although there are benefits to quitting at any age, it is important to quit as soon as possible so your body can begin to heal from the damage caused by smoking. For instance, 12 hours after you quit smoking the carbon monoxide level in your blood drops to normal. Carbon monoxide is harmful because it displaces oxygen in the blood and deprives your heart, brain, and other vital organs of oxygen.  What To Know About Smoking Cessation Products Understanding how smoking cessation products work--and what side effects they may cause--can help you determine which product may be best for you.  If you're considering one of these products, reading labels and talking to your pharmacist and other health care providers are good first steps to take.  You also can check the FDA's website for more information on each product at Drugs@FDA , where you can search for each product by name.  And remember to weigh each product's benefits and risks, among other considerations.  About Nicotine Replacement Therapy (NRT) Nicotine is the substance primarily responsible for causing addiction to tobacco products. Tobacco users who are addicted to nicotine are used to having nicotine in their bodies.  As you try to quit smoking, you may have symptoms of nicotine withdrawal. When you quit, this withdrawal  may cause symptoms like cravings, or urges, to smoke; depression; trouble sleeping; irritability; anxiety; and increased appetite.  Nicotine withdrawal can discourage some smokers from continuing with a quit attempt. But the FDA has  approved several smoking cessation products designed to help users gradually withdraw from smoking (that is, "wean" themselves from smoking) by using specific amounts of nicotine that decrease over time. This type of product is called a "nicotine replacement therapy" or NRT. It supplies nicotine in controlled amounts while sparing you from other chemicals found in tobacco products.  NRTs are available over the counter and by prescription. You should generally use them only for a short time to help you manage nicotine cravings and withdrawal. However, the FDA recognizes that some people may need to use these products longer to stay smoke-free. Talk to your health care provider to determine the best course of treatment for you.  Over-the-counter NRTs are approved for sale to people age 51 and older. They are available under various brand names and sometimes as generic products. They include:  - Skin patches (also called "transdermal nicotine patches"). These patches are placed on the skin, similar to how you would apply an adhesive bandage. - Chewing gum (also called "nicotine gum"). This gum must be chewed according to the labeled instructions to be effective. - Lozenges (also called "nicotine lozenges"). You use these products by dissolving them in your mouth. For over-the-counter products, it's important to follow the instructions on the Drug Facts Label (DFL) and to read the enclosed User's Guide for complete directions and other important information. Ask your health care provider if you have questions.  Currently, prescription nicotine replacement therapy is available only under the brand name Nicotrol, and is available both as a nasal spray and an oral inhaler. The products are FDA-approved only for use by adults.  If you are under age 51 and want to quit smoking, talk to a health care professional about whether you should use nicotine replacement therapies.  Important Advice for People  Considering Nicotine Replacement Therapy Women who are pregnant or breastfeeding should talk to their health care providers and use nicotine replacement products only if the health care providers approve.  Also talk to your health care provider before using these products if you have:  diabetes, heart disease, asthma, or stomach ulcers; had a recent heart attack; high blood pressure that is not controlled with medicine; a history of irregular heartbeat; or been prescribed medication to help you quit smoking. If you take prescription medication for depression or asthma, tell your health care provider if you are quitting smoking because he or she may need to change your prescription dose.  Stop using a nicotine replacement product and call your health care professional if you have any of the following symptoms: nausea; dizziness; weakness; vomiting; fast or irregular heartbeat; mouth problems with the lozenge or gum; or redness or swelling of the skin around the patch that does not go away.  About Prescription Cessation Medicines Without Nicotine  The FDA has approved two smoking cessation products that do not contain nicotine. They are Chantix (varenicline tartrate) and Zyban (buproprion hydrochloride). Both are available in tablet form and by prescription only.  Chantix acts at sites in the brain affected by nicotine by reducing the rewarding effects of nicotine. The precise way that Zyban helps with smoking cessation is unknown.  As with other prescription products, the FDA has evaluated these medicines and found that the benefits outweigh the risks. For users taking these products,  risks include changes in behavior, depressed mood, hostility, aggression, and suicidal thoughts or actions.  The most common side effects of Chantix include nausea; constipation; gas; vomiting; and trouble sleeping or vivid, unusual, or strange dreams. Chantix also may change how you react to alcohol, so  talk to your health care provider about your drinking habits (if you drink alcohol) and whether these habits need to change. Chantix is not recommended for people under the age of 17.  The most commonly observed side effects consistently associated with the use of Zyban are dry mouth and insomnia.  Because Zyban contains the same active ingredient as the antidepressant Wellbutrin (bupropion), the FDA encourages people who use Zyban--and those who are considering it--to talk to their health care providers about the risks of treatment with antidepressant medicines. Zyban has not been studied in children under the age of 94 and is not approved for use in children and teenagers.  Note: If your health care provider prescribes Chantix or Zyban, please read the product's patient medication guide in its entirety. These guides offer important information on side effects, risks, warnings, product ingredients, and what you should talk about with your health care provider before taking the products.  Finally, if you ever have any side effects related to any smoking cessation products, or have any other problems related to your treatment, the FDA would like to hear from you. Please consider making a voluntary and confidential report to the FDA's MedWatch program.  Updated: September 28, 2016          Behavior Modification Ideas for Weight Management  Weight management involves adopting a healthy lifestyle that includes a knowledge of nutrition and exercise, a positive attitude and the right kind of motivation. Internal motives such as better health, increased energy, self-esteem and personal control increase your chances of lifelong weight management success.  Remember to have realistic goals and think long-term success. Believe in yourself and you can do it. The following information will give you ideas to help you meet your goals.  Control Your Home Environment  Eat only while sitting down at the  kitchen or dining room table. Do not eat while watching television, reading, cooking, talking on the phone, standing at the refrigerator or working on the computer. Keep tempting foods out of the house -- don't buy them. Keep tempting foods out of sight. Have low-calorie foods ready to eat. Unless you are preparing a meal, stay out of the kitchen. Have healthy snacks at your disposal, such as small pieces of fruit, vegetables, canned fruit, pretzels, low-fat string cheese and nonfat cottage cheese.  Control Your Work Environment  Do not eat at [employer] or keep tempting snacks at your desk. If you get hungry between meals, plan healthy snacks and bring them with you to work. During your breaks, go for a walk instead of eating. If you work around food, plan in advance the one item you will eat at mealtime. Make it inconvenient to nibble on food by chewing gum, sugarless candy or drinking water or another low-calorie beverage. Do not work through meals. Skipping meals slows down metabolism and may result in overeating at the next meal. If food is available for special occasions, either pick the healthiest item, nibble on low-fat snacks brought from home, don't have anything offered, choose one option and have a small amount, or have only a beverage.  Control Your Mealtime Environment  Serve your plate of food at the stove or kitchen counter. Do not put  the serving dishes on the table. If you do put dishes on the table, remove them immediately when finished eating. Fill half of your plate with vegetables, a quarter with lean protein and a quarter with starch. Use smaller plates, bowls and glasses. A smaller portion will look large when it is in a little dish. Politely refuse second helpings. When fixing your plate, limit portions of food to one scoop/serving or less.   Daily Food Management  Replace eating with another activity that you will not associate with food. Wait 20 minutes before  eating something you are craving. Drink a large glass of water or diet soda before eating. Always have a big glass or bottle of water to drink throughout the day. Avoid high-calorie add-ons such as cream with your coffee, butter, mayonnaise and salad dressings.  Shopping: Do not shop when hungry or tired. Shop from a list and avoid buying anything that is not on your list. If you must have tempting foods, buy individual-sized packages and try to find a lower-calorie alternative. Don't taste test in the store. Read food labels. Compare products to help you make the healthiest choices.  Preparation: Chew a piece of gum while cooking meals. Use a quarter teaspoon if you taste test your food. Try to only fix what you are going to eat, leaving yourself no chance for seconds. If you have prepared more food than you need, portion it into individual containers and freeze or refrigerate immediately. Don't snack while cooking meals.  Eating: Eat slowly. Remember it takes about 20 minutes for your stomach to send a message to your brain that it is full. Don't let fake hunger make you think you need more. The ideal way to eat is to take a bite, put your utensil down, take a sip of water, cut your next bite, take a bit, put your utensil down and so on. Do not cut your food all at one time. Cut only as needed. Take small bites and chew your food well. Stop eating for a minute or two at least once during a meal or snack. Take breaks to reflect and have conversation.  Cleanup and Leftovers: Label leftovers for a specific meal or snack. Freeze or refrigerate individual portions of leftovers. Do not clean up if you are still hungry.  Eating Out and Social Eating  Do not arrive hungry. Eat something light before the meal. Try to fill up on low-calorie foods, such as vegetables and fruit, and eat smaller portions of the high-calorie foods. Eat foods that you like, but choose small portions. If you  want seconds, wait at least 20 minutes after you have eaten to see if you are actually hungry or if your eyes are bigger than your stomach. Limit alcoholic beverages. Try a soda water with a twist of lime. Do not skip other meals in the day to save room for the special event.  At Restaurants: Order  la carte rather than buffet style. Order some vegetables or a salad for an appetizer instead of eating bread. If you order a high-calorie dish, share it with someone. Try an after-dinner mint with your coffee. If you do have dessert, share it with two or more people. Don't overeat because you do not want to waste food. Ask for a doggie bag to take extra food home. Tell the server to put half of your entree in a to go bag before the meal is served to you. Ask for salad dressing, gravy or high-fat sauces  on the side. Dip the tip of your fork in the dressing before each bite. If bread is served, ask for only one piece. Try it plain without butter or oil. At [employer] where oil and vinegar is served with bread, use only a small amount of oil and a lot of vinegar for dipping.  At a Friend[facility]: Offer to bring a dish, appetizer or dessert that is low in calories. Serve yourself small portions or tell the host that you only want a small amount. Stand or sit away from the snack table. Stay away from the kitchen or stay busy if you are near the food. Limit your alcohol intake.  At [employer] and Cafeterias: Cover most of your plate with lettuce and/or vegetables. Use a salad plate instead of a dinner plate. After eating, clear away your dishes before having coffee or tea.  Entertaining at Home: Explore low-fat, low-cholesterol cookbooks. Use single-serving foods like chicken breasts or hamburger patties. Prepare low-calorie appetizers and desserts.   Holidays: Keep tempting foods out of sight. Decorate the house without using food. Have low-calorie beverages and foods on hand for  guests. Allow yourself one planned treat a day. Don't skip meals to save up for the holiday feast. Eat regular, planned meals.   Exercise Well  Make exercise a priority and a planned activity in the day. If possible, walk the entire or part of the distance to work. Get an exercise buddy. Go for a walk with a colleague during one of your breaks, go to the gym, run or take a walk with a friend, walk in the mall with a shopping companion. Park at the end of the parking lot and walk to the store or office entrance. Always take the stairs all of the way or at least part of the way to your floor. If you have a desk job, walk around the office frequently. Do leg lifts while sitting at your desk. Do something outside on the weekends like going for a hike or a bike ride.   Have a Healthy Attitude  Make health your weight management priority. Be realistic. Have a goal to achieve a healthier you, not necessarily the lowest weight or ideal weight based on calculations or tables. Focus on a healthy eating style, not on dieting. Dieting usually lasts for a short amount of time and rarely produces long-term success. Think long term. You are developing new healthy behaviors to follow next month, in a year and in a decade.    This information is for educational purposes only and is not intended to replace the advice of your doctor or health care provider. We encourage you to discuss with your doctor any questions or concerns you may have.        Guidelines for Losing Weight   We want weight loss that will last so you should lose 1-2 pounds a week.  THAT IS IT! Please pick THREE things a month to change. Once it is a habit check off the item. Then pick another three items off the list to become habits.  If you are already doing a habit on the list GREAT!  Cross that item off!  Don't drink your calories. Ie, alcohol, soda, fruit juice, and sweet tea.   Drink more water. Drink a glass when you  feel hungry or before each meal.   Eat breakfast - Complex carb and protein (likeDannon light and fit yogurt, oatmeal, fruit, eggs, [place] bacon).  Measure your cereal.  Eat  no more than one cup a day. (ie Kashi)  Eat an apple a day.  Add a vegetable a day.  Try a new vegetable a month.  Use Pam! Stop using oil or butter to cook.  Don't finish your plate or use smaller plates.  Share your dessert.  Eat sugar free Jello for dessert or frozen grapes.  Don't eat 2-3 hours before bed.  Switch to whole wheat bread, pasta, and brown rice.  Make healthier choices when you eat out. No fries!  Pick baked chicken, NOT fried.  Don't forget to SLOW DOWN when you eat. It is not going anywhere.   Take the stairs.  Park far away in the parking lot  Lift soup cans (or weights) for 10 minutes while watching TV.  Walk at work for 10 minutes during break.  Walk outside 1 time a week with your friend, kids, dog, or significant other.  Start a walking group at church.  Walk the mall as much as you can tolerate.   Keep a food diary.  Weigh yourself daily.  Walk for 15 minutes 3 days per week.  Cook at home more often and eat out less. If life happens and you go back to old habits, it is okay.  Just start over. You can do it!  If you experience chest pain, get short of breath, or tired during the exercise, please stop immediately and inform your doctor.    Before you even begin to attack a weight-loss plan, it pays to remember this: You are not fat. You have fat. Losing weight isn't about blame or shame; it's simply another achievement to accomplish. Dieting is like any other skill--you have to buckle down and work at it. As long as you act in a smart, reasonable way, you'll ultimately get where you want to be. Here are some weight loss pearls for you.   1. It's Not a Diet. It's a Lifestyle Thinking of a diet as something you're on and suffering through only for the short term  doesn't work. To shed weight and keep it off, you need to make permanent changes to the way you eat. It's OK to indulge occasionally, of course, but if you cut calories temporarily and then revert to your old way of eating, you'll gain back the weight quicker than you can say yo-yo. Use it to lose it. Research shows that one of the best predictors of long-term weight loss is how many pounds you drop in the first month. For that reason, nutritionists often suggest being stricter for the first two weeks of your new eating strategy to build momentum. Cut out added sugar and alcohol and avoid unrefined carbs. After that, figure out how you can reincorporate them in a way that's healthy and maintainable.  2. There's a Right Way to Exercise Working out burns calories and fat and boosts your metabolism by building muscle. But those trying to lose weight are notorious for overestimating the number of calories they burn and underestimating the amount they take in. Unfortunately, your system is biologically programmed to hold on to extra pounds and that means when you start exercising, your body senses the deficit and ramps up its hunger signals. If you're not diligent, you'll eat everything you burn and then some. Use it, to lose it. Cardio gets all the exercise glory, but strength and interval training are the real heroes. They help you build lean muscle, which in turn increases your metabolism and calorie-burning ability 3. Don't  Overreact to Mild Hunger Some people have a hard time losing weight because of hunger anxiety. To them, being hungry is bad--something to be avoided at all costs--so they carry snacks with them and eat when they don't need to. Others eat because they're stressed out or bored. While you never want to get to the point of being ravenous (that's when bingeing is likely to happen), a hunger pang, a craving, or the fact that it's 3:00 p.m. should not send you racing for the vending machine or  obsessing about the energy bar in your purse. Ideally, you should put off eating until your stomach is growling and it's difficult to concentrate.  Use it to lose it. When you feel the urge to eat, use the HALT method. Ask yourself, Am I really hungry? Or am I angry or anxious, lonely or bored, or tired? If you're still not certain, try the apple test. If you're truly hungry, an apple should seem delicious; if it doesn't, something else is going on. Or you can try drinking water and making yourself busy, if you are still hungry try a healthy snack.  4. Not All Calories Are Created Equal The mechanics of weight loss are pretty simple: Take in fewer calories than you use for energy. But the kind of food you eat makes all the difference. Processed food that's high in saturated fat and refined starch or sugar can cause inflammation that disrupts the hormone signals that tell your brain you're full. The result: You eat a lot more.  Use it to lose it. Clean up your diet. Swap in whole, unprocessed foods, including vegetables, lean protein, and healthy fats that will fill you up and give you the biggest nutritional bang for your calorie buck. In a few weeks, as your brain starts receiving regular hunger and fullness signals once again, you'll notice that you feel less hungry overall and naturally start cutting back on the amount you eat.  5. Protein, Produce, and Plant-Based Fats Are Your Weight-Loss Trinity Here's why eating the three Ps regularly will help you drop pounds. Protein fills you up. You need it to build lean muscle, which keeps your metabolism humming so that you can torch more fat. People in a weight-loss program who ate double the recommended daily allowance for protein (about 110 grams for a 150-pound woman) lost 70 percent of their weight from fat, while people who ate the RDA lost only about 40 percent, one study found. Produce is packed with filling fiber. "It's very difficult to consume too  many calories if you're eating a lot of vegetables. Example: Three cups of broccoli is a lot of food, yet only 93 calories. (Fruit is another story. It can be easy to overeat and can contain a lot of calories from sugar, so be sure to monitor your intake.) Plant-based fats like olive oil and those in avocados and nuts are healthy and extra satiating.  Use it to lose it. Aim to incorporate each of the three Ps into every meal and snack. People who eat protein throughout the day are able to keep weight off, according to a study in the [place] [employer]. In addition to meat, poultry and seafood, good sources are beans, lentils, eggs, tofu, and yogurt. As for fat, keep portion sizes in check by measuring out salad dressing, oil, and nut butters (shoot for one to two tablespoons). Finally, eat veggies or a little fruit at every meal. People who did that consumed 308 fewer  calories but didn't feel any hungrier than when they didn't eat more produce.  7. How You Eat Is As Important As What You Eat In order for your brain to register that you're full, you need to focus on what you're eating. Sit down whenever you eat, preferably at a table. Turn off the TV or computer, put down your phone, and look at your food. Smell it. Chew slowly, and don't put another bite on your fork until you swallow. When women ate lunch this attentively, they consumed 30 percent less when snacking later than those who listened to an audiobook at lunchtime, according to a study in the [place] [facility]. 8. Weighing Yourself Really Works The scale provides the best evidence about whether your efforts are paying off. Seeing the numbers tick up or down or stagnate is motivation to keep going--or to rethink your approach. A 2015 study at [facility] found that daily weigh-ins helped people lose more weight, keep it off, and maintain that loss, even after two years. Use it to lose it. Step on the scale at  the same time every day for the best results. If your weight shoots up several pounds from one weigh-in to the next, don't freak out. Eating a lot of salt the night before or having your period is the likely culprit. The number should return to normal in a day or two. It's a steady climb that you need to do something about. 9. Too Much Stress and Too Little Sleep Are Your Enemies When you're tired and frazzled, your body cranks up the production of cortisol, the stress hormone that can cause carb cravings. Not getting enough sleep also boosts your levels of ghrelin, a hormone associated with hunger, while suppressing leptin, a hormone that signals fullness and satiety. People on a diet who slept only five and a half hours a night for two weeks lost 55 percent less fat and were hungrier than those who slept eight and a half hours, according to a study in the [place]. Use it to lose it. Prioritize sleep, aiming for seven hours or more a night, which research shows helps lower stress. And make sure you're getting quality zzz's. If a snoring spouse or a fidgety cat wakes you up frequently throughout the night, you may end up getting the equivalent of just four hours of sleep, according to a study from [facility]. Keep pets out of the bedroom, and use a white-noise app to drown out snoring. 10. You Will Hit a plateau--And You Can Bust Through It As you slim down, your body releases much less leptin, the fullness hormone.  If you're not strength training, start right now. Building muscle can raise your metabolism to help you overcome a plateau. To keep your body challenged and burning calories, incorporate new moves and more intense intervals into your workouts or add another sweat session to your weekly routine. Alternatively, cut an extra 100 calories or so a day from your diet. Now that you've lost weight, your body simply doesn't need as much fuel.    Since food equals  calories, in order to lose weight you must either eat fewer calories, exercise more to burn off calories with activity, or both. Food that is not used to fuel the body is stored as fat. A major component of losing weight is to make smarter food choices. Here's how:  1)   Limit non-nutritious foods, such as: Sugar, honey, syrups and candy Pastries,  donuts, pies, cakes and cookies Soft drinks, sweetened juices and alcoholic beverages  2)  Cut down on high-fat foods by: - Choosing poultry, fish or lean red meat - Choosing low-fat cooking methods, such as baking, broiling, steaming, grilling and boiling - Using low-fat or non-fat dairy products - Using vinaigrette, herbs, lemon or fat-free salad dressings - Avoiding fatty meats, such as bacon, sausage, franks, ribs and luncheon meats - Avoiding high-fat snacks like nuts, chips and chocolate - Avoiding fried foods - Using less butter, margarine, oil and mayonnaise - Avoiding high-fat gravies, cream sauces and cream-based soups  3) Eat a variety of foods, including: - Fruit and vegetables that are raw, steamed or baked - Whole grains, breads, cereal, rice and pasta - Dairy products, such as low-fat or non-fat milk or yogurt, low-fat cottage cheese and low-fat cheese - Protein-rich foods like chicken, [place], fish, lean meat and legumes, or beans  4) Change your eating habits by: - Eat three balanced meals a day to help control your hunger - Watch portion sizes and eat small servings of a variety of foods - Choose low-calorie snacks - Eat only when you are hungry and stop when you are satisfied - Eat slowly and try not to perform other tasks while eating - Find other activities to distract you from food, such as walking, taking up a hobby or being involved in the community - Include regular exercise in your daily routine ( minimum of 20 min of moderate-intensity exercise at least 5 days/week)  - Find a support group, if necessary, for  emotional support in your weight loss journey           Easy ways to cut 100 calories   1. Eat your eggs with hot sauce OR salsa instead of cheese.  Eggs are great for breakfast, but many people consider eggs and cheese to be BFFs. Instead of cheese--1 oz. of cheddar has 114 calories--top your eggs with hot sauce, which contains no calories and helps with satiety and metabolism. Salsa is also a great option!!  2. Top your toast, waffles or pancakes with fresh berries instead of jelly or syrup. Half a cup of berries--fresh, frozen or thawed--has about 40 calories, compared with 2 tbsp. of maple syrup or jelly, which both have about 100 calories. The berries will also give you a good punch of fiber, which helps keep you full and satisfied and won't spike blood sugar quickly like the jelly or syrup. 3. Swap the non-fat latte for black coffee with a splash of half-and-half. Contrary to its name, that non-fat latte has 130 calories and a startling 19g of carbohydrates per 16 oz. serving. Replacing that 'light' drinkable dessert with a black coffee with a splash of half-and-half saves you more than 100 calories per 16 oz. serving. 4. Sprinkle salads with freeze-dried raspberries instead of dried cranberries. If you want a sweet addition to your nutritious salad, stay away from dried cranberries. They have a whopping 130 calories per  cup and 30g carbohydrates. Instead, sprinkle freeze-dried raspberries guilt-free and save more than 100 calories per  cup serving, adding 3g of belly-filling fiber. 5. Go for mustard in place of mayo on your sandwich. Mustard can add really nice flavor to any sandwich, and there are tons of varieties, from spicy to honey. A serving of mayo is 95 calories, versus 10 calories in a serving of mustard.  Or try an avocado mayo spread: You can find the recipe few click this link: https://www.californiaavocado.com/recipes/recipe-container/[place]-avocado-mayo  6. Choose  a DIY salad dressing instead of the store-bought kind. Mix Dijon or whole grain mustard with low-fat Kefir or red wine vinegar and garlic. 7. Use hummus as a spread instead of a dip. Use hummus as a spread on a high-fiber cracker or tortilla with a sandwich and save on calories without sacrificing taste. 8. Pick just one salad "accessory." Salad isn't automatically a calorie winner. It's easy to over-accessorize with toppings. Instead of topping your salad with nuts, avocado and cranberries (all three will clock in at 313 calories), just pick one. The next day, choose a different accessory, which will also keep your salad interesting. You don't wear all your jewelry every day, right? 9. Ditch the white pasta in favor of spaghetti squash. One cup of cooked spaghetti squash has about 40 calories, compared with traditional spaghetti, which comes with more than 200. Spaghetti squash is also nutrient-dense. It's a good source of fiber and Vitamins A and C, and it can be eaten just like you would eat pasta--with a great tomato sauce and [place] meatballs or with pesto, tofu and spinach, for example. 10. Dress up your chili, soups and stews with non-fat [place] yogurt instead of sour cream. Just a 'dollop' of sour cream can set you back 115 calories and a whopping 12g of fat--seven of which are of the artery-clogging variety. Added bonus: [place] yogurt is packed with muscle-building protein, calcium and B Vitamins. 11. Mash cauliflower instead of mashed potatoes. One cup of traditional mashed potatoes--in all their creamy goodness--has more than 200 calories, compared to mashed cauliflower, which you can typically eat for less than 100 calories per 1 cup serving. Cauliflower is a great source of the antioxidant indole-3-carbinol (I3C), which may help reduce the risk of some cancers, like breast cancer. 12. Ditch the ice cream sundae in favor of a [place] yogurt parfait. Instead of a cup of ice cream or fro-yo for  dessert, try 1 cup of nonfat Greek yogurt topped with fresh berries and a sprinkle of cacao nibs. Both toppings are packed with antioxidants, which can help reduce cellular inflammation and oxidative damage. And the comparison is a no-brainer: One cup of ice cream has about 275 calories; one cup of frozen yogurt has about 230; and a cup of Greek yogurt has just 130, plus twice the protein, so you're less likely to return to the freezer for a second helping. 13. Put olive oil in a spray container instead of using it directly from the bottle. Each tablespoon of olive oil is 120 calories and 15g of fat. Use a mister instead of pouring it straight into the pan or onto a salad. This allows for portion control and will save you more than 100 calories. 14. When baking, substitute canned pumpkin for butter or oil. Canned pumpkin--not pumpkin pie mix--is loaded with Vitamin A, which is important for skin and eye health, as well as immunity. And the comparisons are pretty crazy:  cup of canned pumpkin has about 40 calories, compared to butter or oil, which has more than 800 calories. Yes, 800 calories. Applesauce and mashed banana can also serve as good substitutions for butter or oil, usually in a 1:1 ratio. 15. Top casseroles with high-fiber cereal instead of breadcrumbs. Breadcrumbs are typically made with white bread, while breakfast cereals contain 5-9g of fiber per serving. Not only will you save more than 150 calories per  cup serving, the swap will also keep you more full and you'll get a metabolism boost  from the added fiber. 16. Snack on pistachios instead of macadamia nuts. Believe it or not, you get the same amount of calories from 35 pistachios (100 calories) as you would from only five macadamia nuts. 17. Chow down on kale chips rather than potato chips. This is my favorite 'don't knock it 'till you try it' swap. Kale chips are so easy to make at home, and you can spice them up with a little grated  parmesan or chili powder. Plus, they're a mere fraction of the calories of potato chips, but with the same crunch factor we crave so often. 18. Add seltzer and some fruit slices to your cocktail instead of soda or fruit juice. One cup of soda or fruit juice can pack on as much as 140 calories. Instead, use seltzer and fruit slices. The fruit provides valuable phytochemicals, such as flavonoids and anthocyanins, which help to combat cancer and stave off the aging process.

## 2020-01-17 ENCOUNTER — Ambulatory Visit (HOSPITAL_COMMUNITY): Payer: BC Managed Care – PPO | Attending: Cardiovascular Disease

## 2020-01-17 DIAGNOSIS — R079 Chest pain, unspecified: Secondary | ICD-10-CM | POA: Diagnosis present

## 2020-01-18 ENCOUNTER — Ambulatory Visit (HOSPITAL_COMMUNITY): Payer: BC Managed Care – PPO

## 2020-01-20 IMAGING — CR DG LUMBAR SPINE 2-3V
3 series · 3 of 3 positions shown · non-contrast
Comparison: Lumbar spine series dated March 03, 2015

CLINICAL DATA: Several month history of low back pain radiating to
both legs with no discrete episode of trauma. Difficulty holding his
urine.

EXAM:
LUMBAR SPINE - 2-3 VIEW

[w lumbar spine ap]
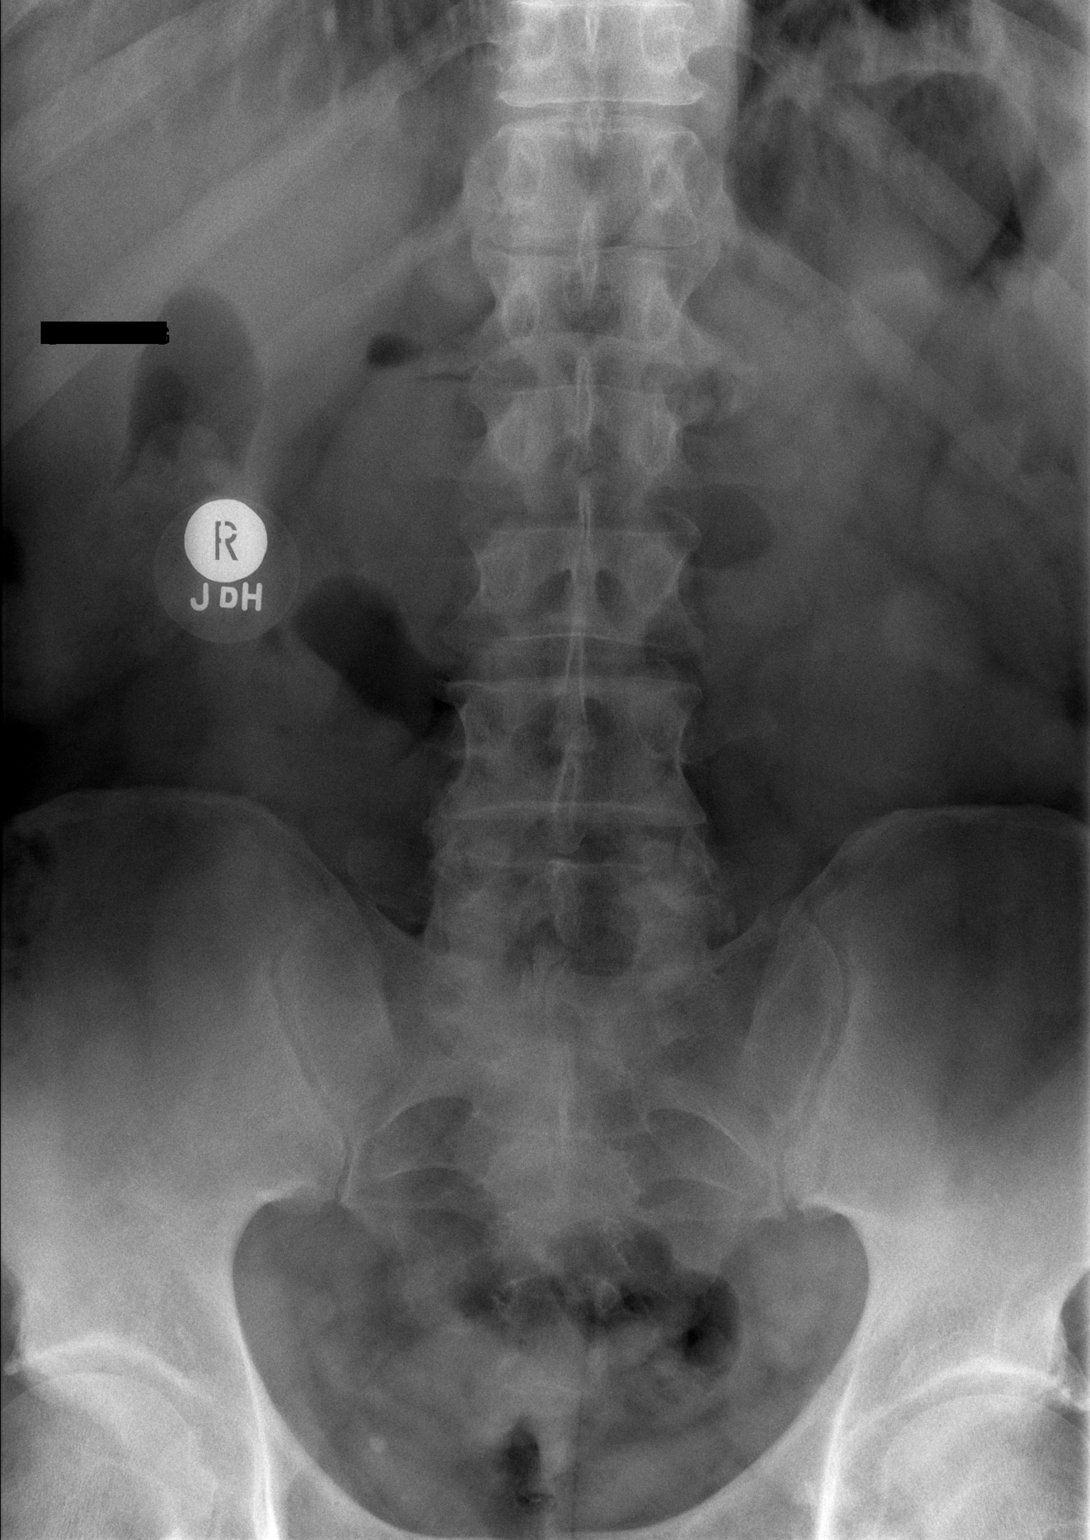

[w lumbar spine lat]
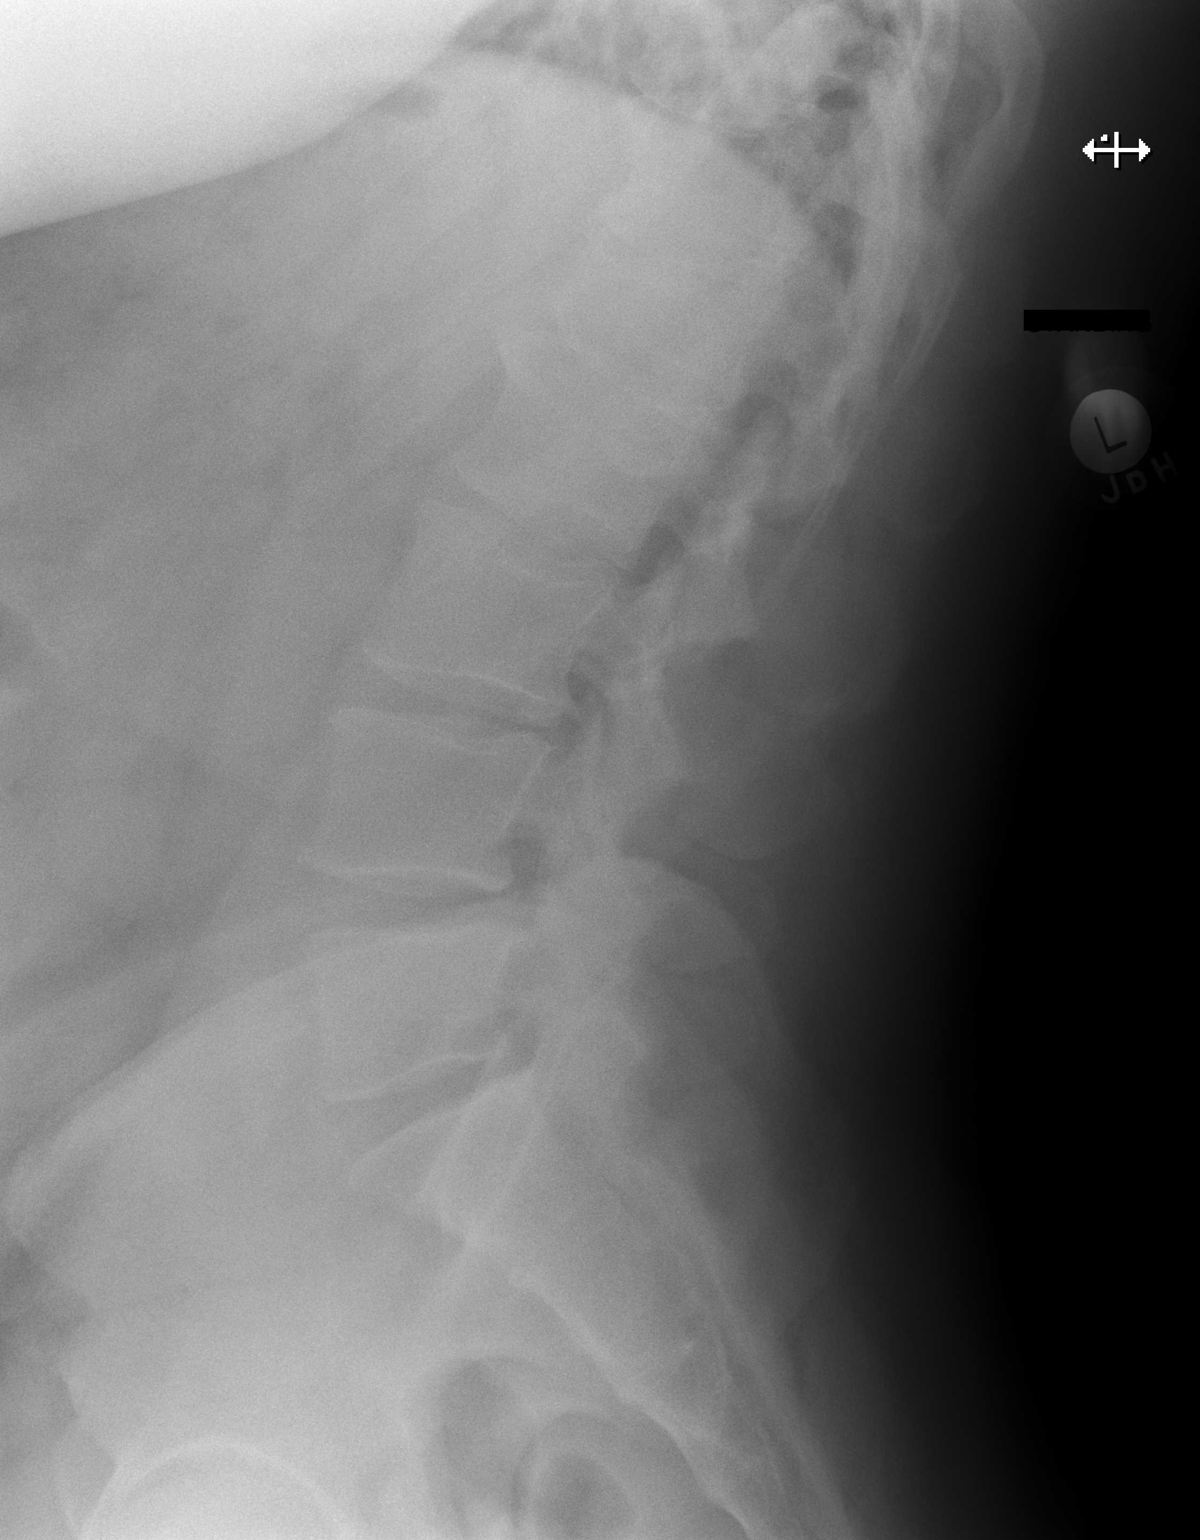

[w lumbar l-5 s-1 spot]
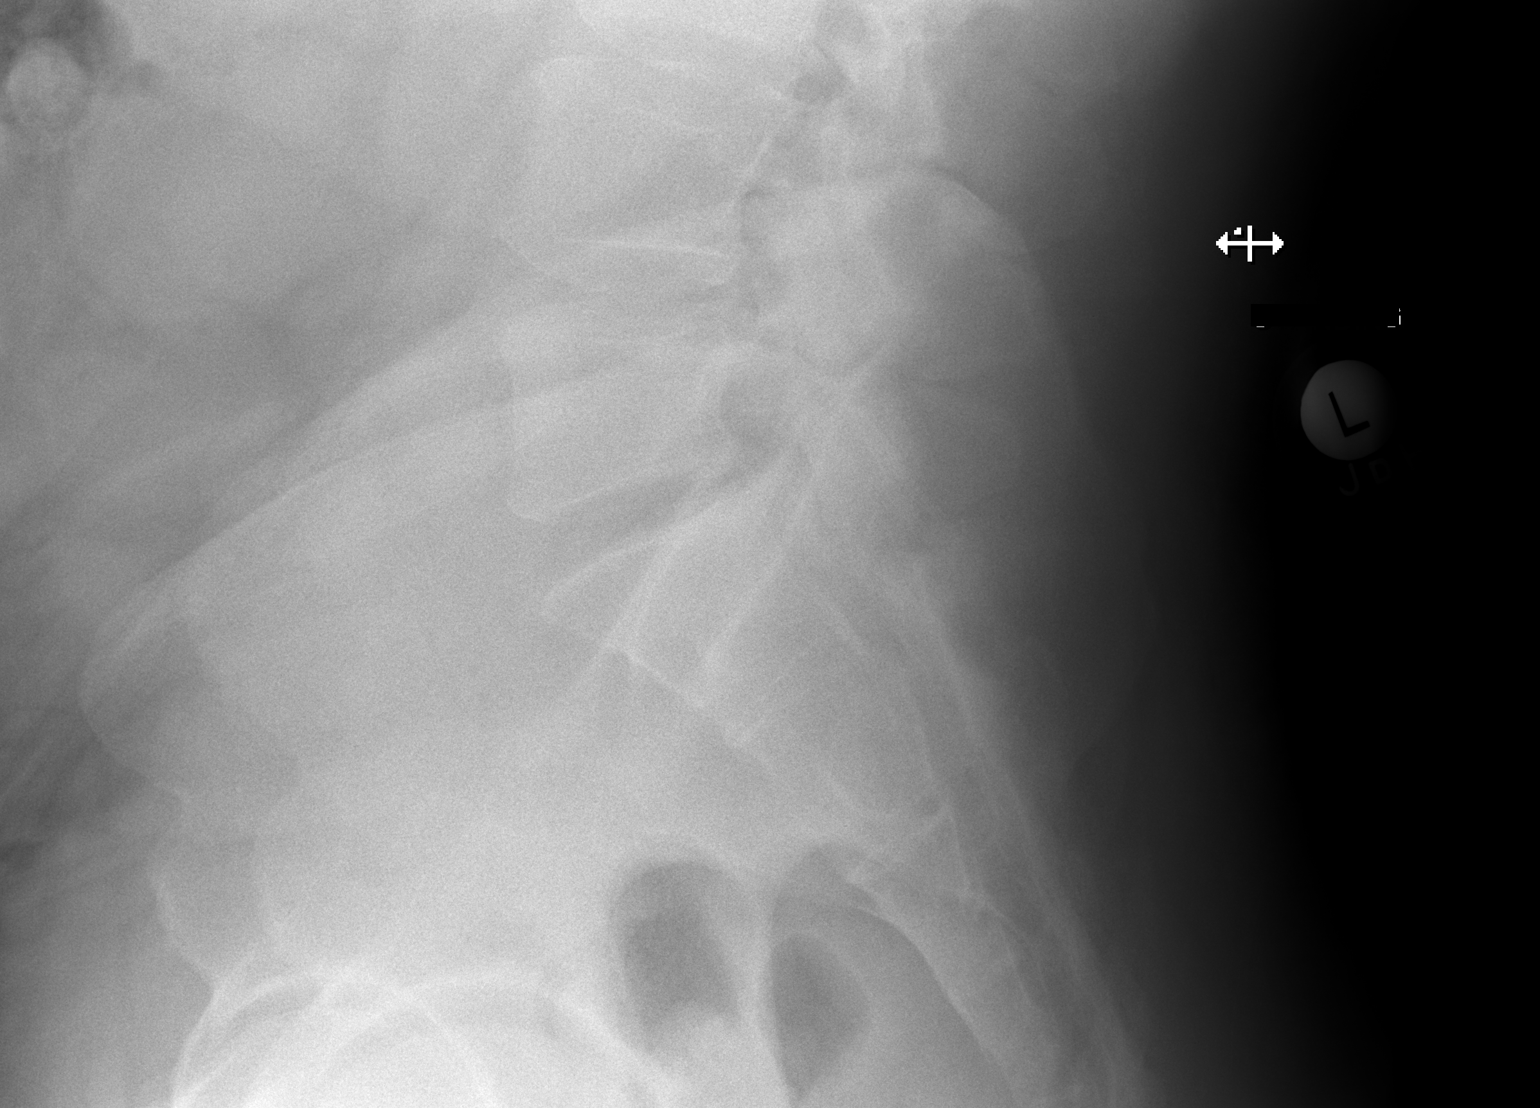

[3 of 3 positions shown; findings below may reference images not displayed]

FINDINGS: The lumbar vertebral bodies are preserved in height. The pedicles
and transverse processes are intact. There is chronic loss of height
of the body of L1. Mild superior endplate depression of L2 is
suspected and appears new since the previous study. The disc space
heights are reasonably well-maintained with exception of T12-L1
where there is chronic narrowing. There is facet joint hypertrophy
at L4-5 and at L5-S1.
IMPRESSION: Chronic partial compression of the body of L1 not greatly changed
from the previous study. There has been mild superior endplate
depression of L2 since the previous study. The other vertebral
bodies are preserved in height. No spondylolisthesis.

No significant disc space height loss with the exception of chronic
narrowing at T12-L1.

## 2020-01-20 IMAGING — CR DG SHOULDER 2+V*R*
3 series · 3 of 3 positions shown · non-contrast
Comparison: None.

CLINICAL DATA: Shoulder pain and decreased range of motion, no
known injury, initial encounter

EXAM:
RIGHT SHOULDER - 2+ VIEW

[w shoulder grashey right]
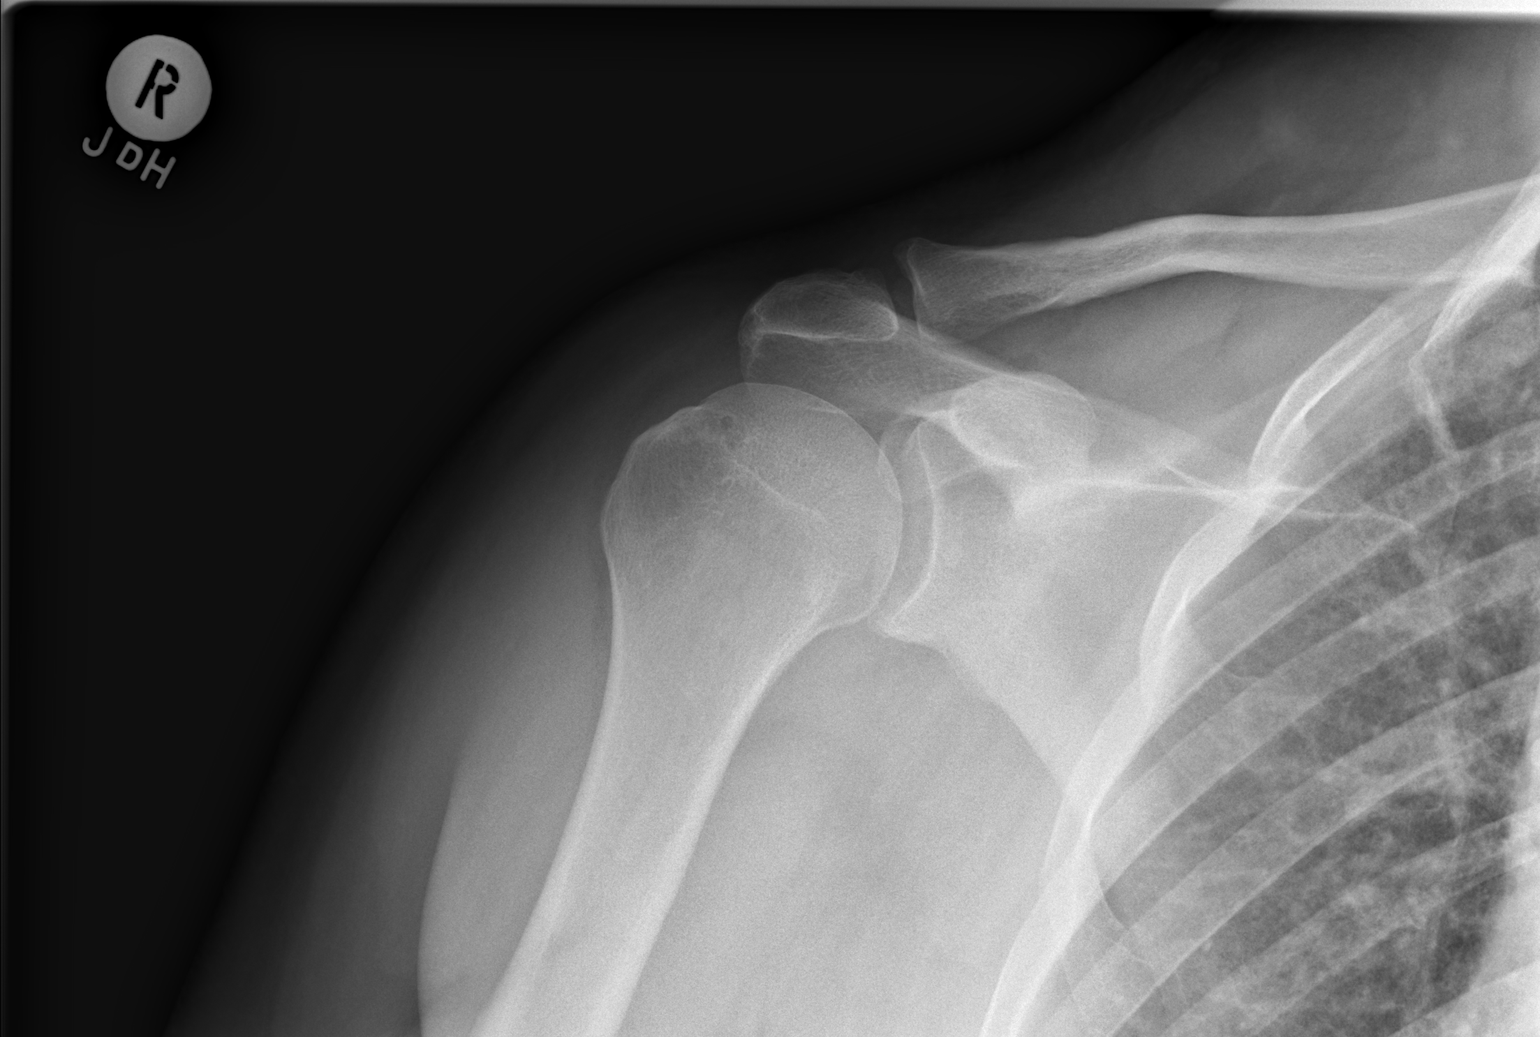

[w shoulder y-view right]
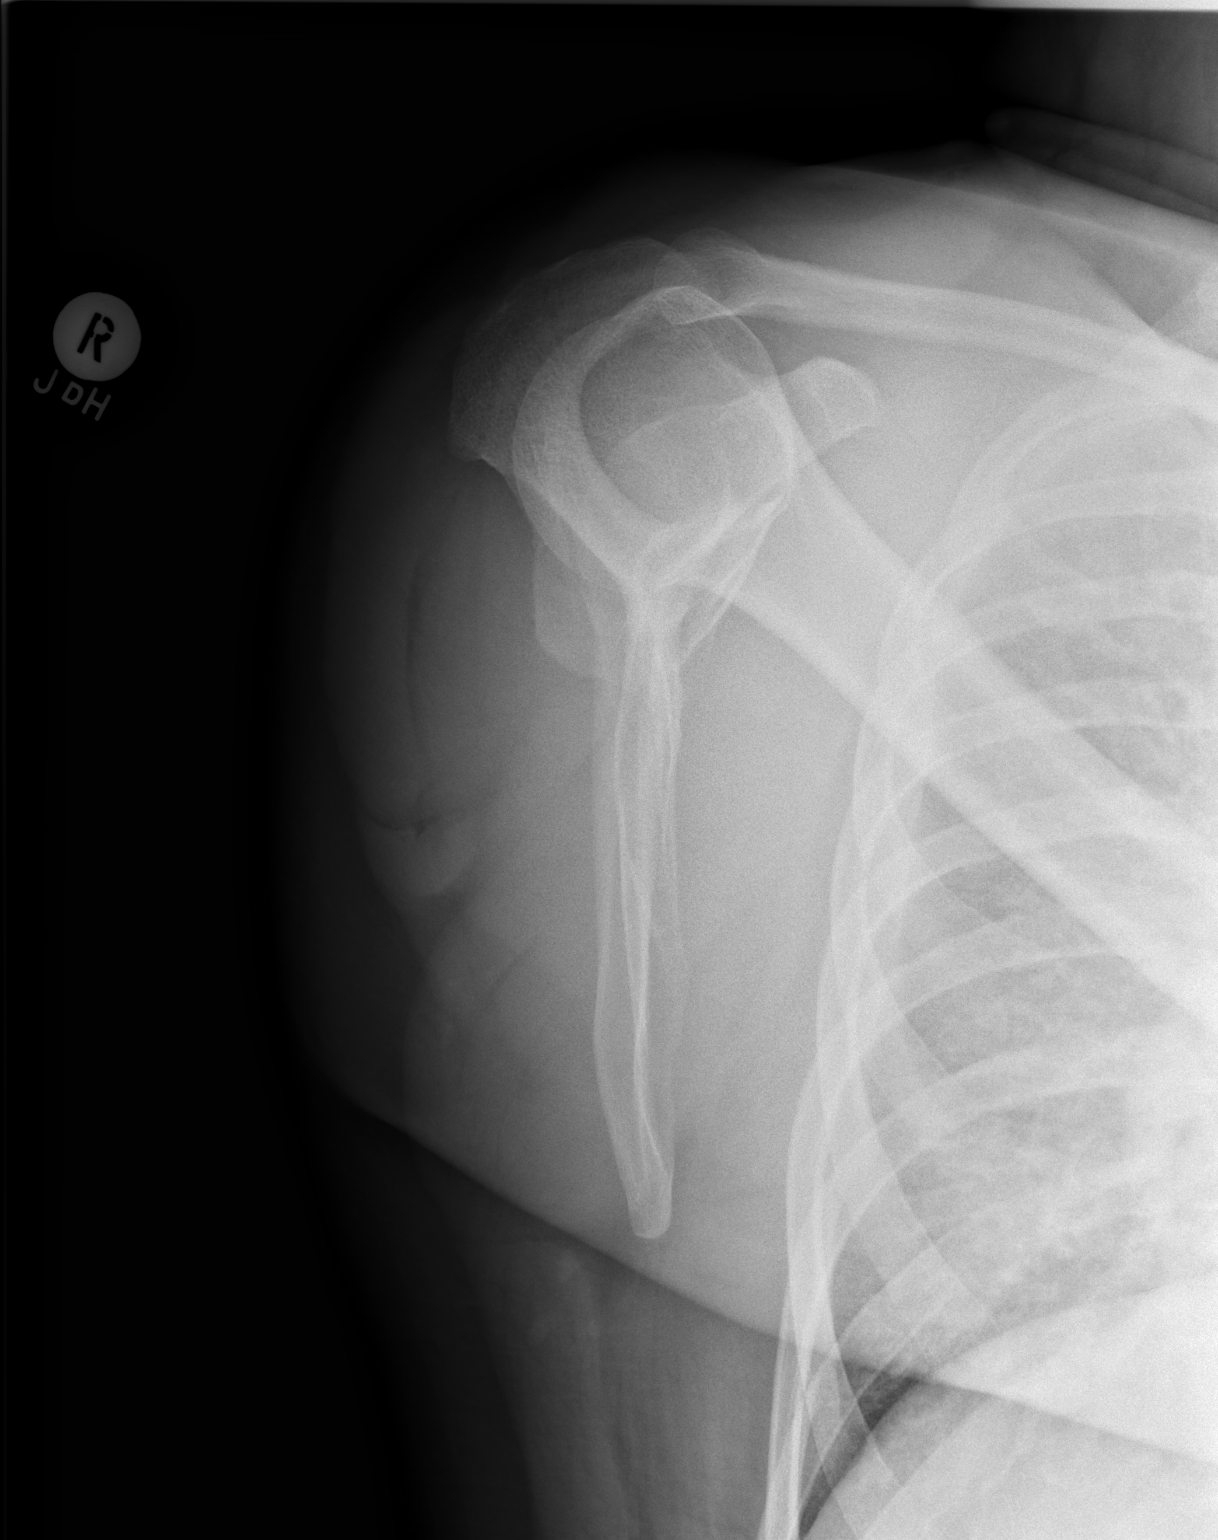

[w shoulder axillary right]
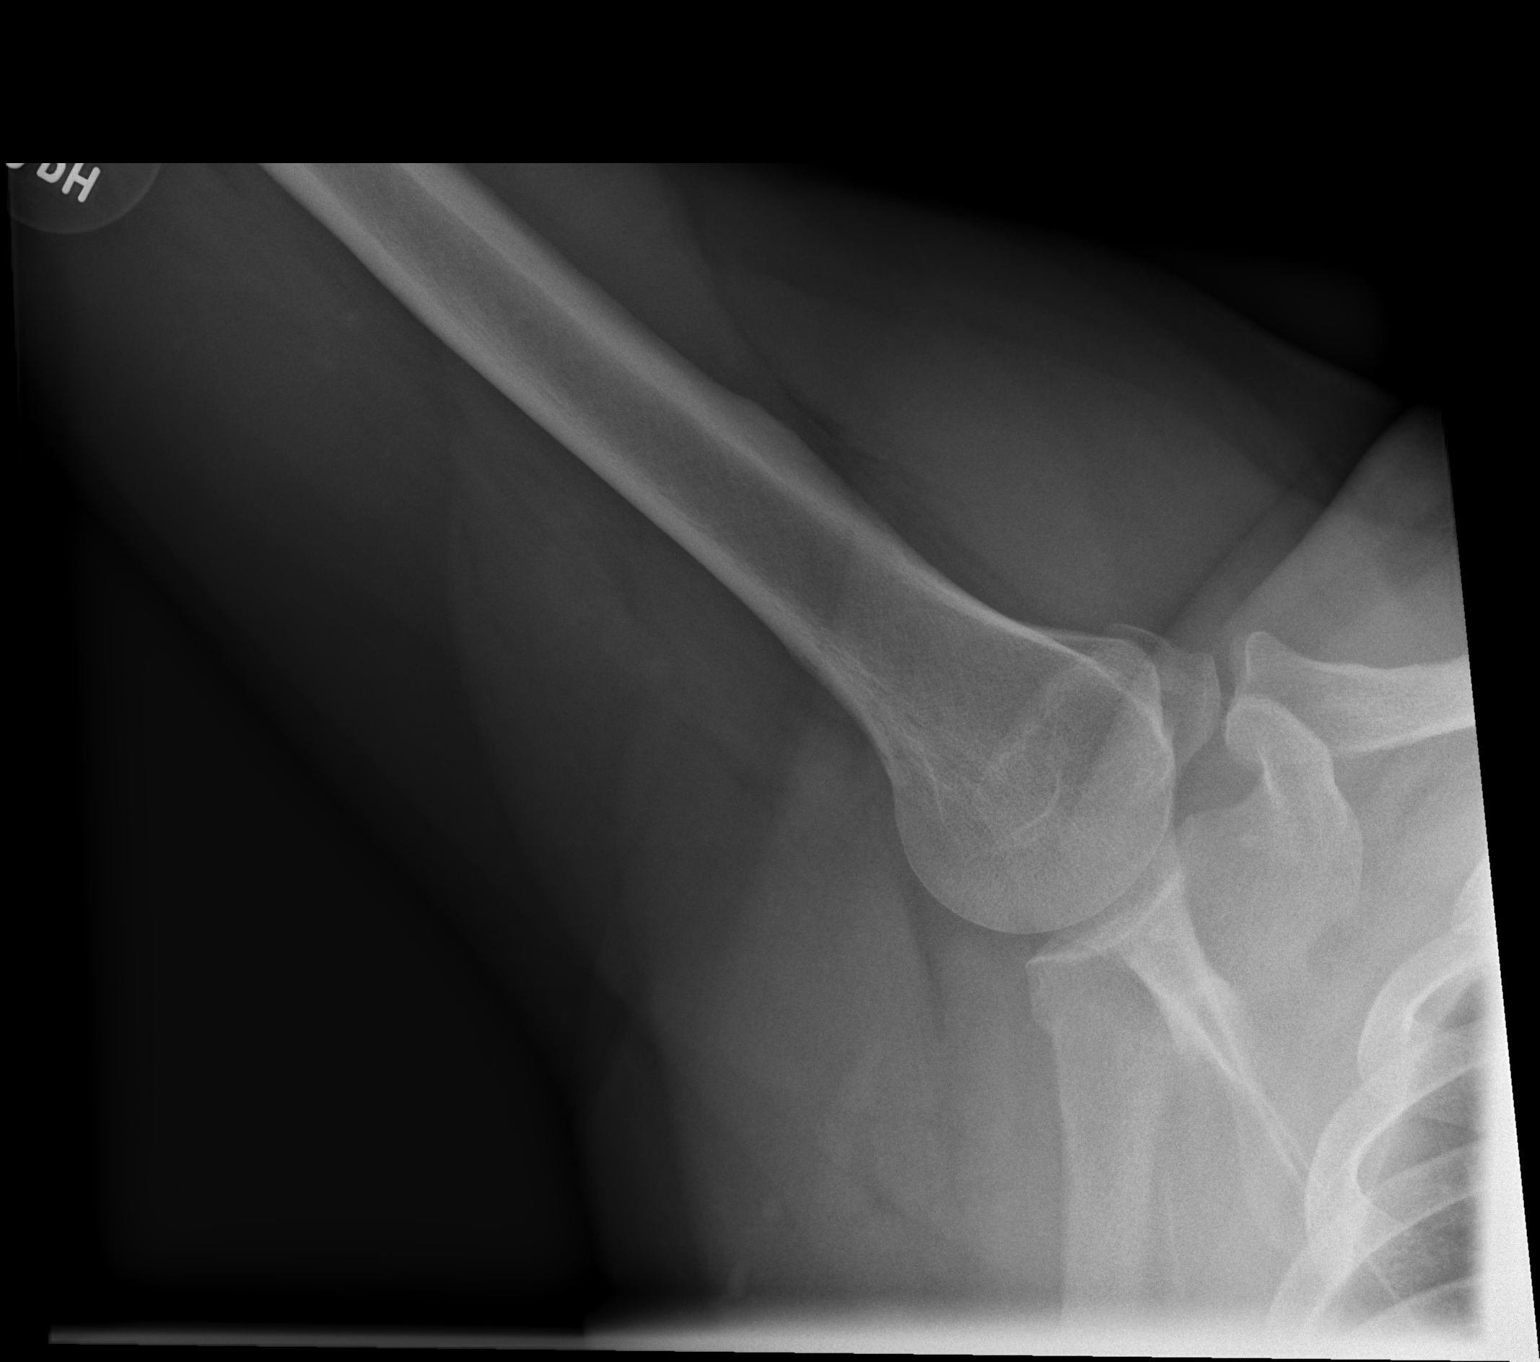

[3 of 3 positions shown; findings below may reference images not displayed]

FINDINGS: Mild degenerative changes of the acromioclavicular joint are noted.
No acute fracture or dislocation is seen. The underlying bony thorax
is within normal limits. No soft tissue abnormality is noted.
IMPRESSION: No acute abnormality noted.

## 2020-01-23 ENCOUNTER — Encounter: Payer: Self-pay | Admitting: Registered Nurse

## 2020-01-23 ENCOUNTER — Encounter: Payer: BC Managed Care – PPO | Attending: Physical Medicine & Rehabilitation | Admitting: Registered Nurse

## 2020-01-23 ENCOUNTER — Other Ambulatory Visit: Payer: Self-pay

## 2020-01-23 VITALS — BP 146/83 | HR 86 | Temp 97.7°F | Ht 77.0 in | Wt 352.8 lb

## 2020-01-23 DIAGNOSIS — Z5181 Encounter for therapeutic drug level monitoring: Secondary | ICD-10-CM | POA: Insufficient documentation

## 2020-01-23 DIAGNOSIS — E538 Deficiency of other specified B group vitamins: Secondary | ICD-10-CM | POA: Insufficient documentation

## 2020-01-23 DIAGNOSIS — R0601 Orthopnea: Secondary | ICD-10-CM | POA: Insufficient documentation

## 2020-01-23 DIAGNOSIS — Z79891 Long term (current) use of opiate analgesic: Secondary | ICD-10-CM | POA: Insufficient documentation

## 2020-01-23 DIAGNOSIS — M5416 Radiculopathy, lumbar region: Secondary | ICD-10-CM

## 2020-01-23 DIAGNOSIS — M21611 Bunion of right foot: Secondary | ICD-10-CM | POA: Diagnosis not present

## 2020-01-23 DIAGNOSIS — M25562 Pain in left knee: Secondary | ICD-10-CM | POA: Diagnosis present

## 2020-01-23 DIAGNOSIS — G609 Hereditary and idiopathic neuropathy, unspecified: Secondary | ICD-10-CM | POA: Insufficient documentation

## 2020-01-23 DIAGNOSIS — M21612 Bunion of left foot: Secondary | ICD-10-CM | POA: Diagnosis not present

## 2020-01-23 DIAGNOSIS — G8929 Other chronic pain: Secondary | ICD-10-CM | POA: Diagnosis present

## 2020-01-23 DIAGNOSIS — Z79899 Other long term (current) drug therapy: Secondary | ICD-10-CM | POA: Diagnosis present

## 2020-01-23 DIAGNOSIS — G608 Other hereditary and idiopathic neuropathies: Secondary | ICD-10-CM | POA: Diagnosis not present

## 2020-01-23 DIAGNOSIS — R600 Localized edema: Secondary | ICD-10-CM | POA: Diagnosis present

## 2020-01-23 DIAGNOSIS — G894 Chronic pain syndrome: Secondary | ICD-10-CM | POA: Diagnosis not present

## 2020-01-23 DIAGNOSIS — M5136 Other intervertebral disc degeneration, lumbar region: Secondary | ICD-10-CM

## 2020-01-23 DIAGNOSIS — M79671 Pain in right foot: Secondary | ICD-10-CM | POA: Diagnosis not present

## 2020-01-23 DIAGNOSIS — M79672 Pain in left foot: Secondary | ICD-10-CM | POA: Diagnosis not present

## 2020-01-23 MED ORDER — OXYCODONE-ACETAMINOPHEN 7.5-325 MG PO TABS: 1.0000 | ORAL_TABLET | Freq: Four times a day (QID) | ORAL | 0 refills | Status: DC | PRN

## 2020-01-23 NOTE — Progress Notes (Signed)
Subjective:    Patient ID: Eduardo [name], male    DOB: 06/07/1971, 49 y.o.   MRN: [id number]  HPI: Eduardo [name] is a 49 y.o. male who returns for follow up appointment for chronic pain and medication refill. He states his pain is located in his lower back radiating into his bilateral lower extremities and bilateral feet. He rates his pain 8. His current exercise regime is walking and performing stretching exercises.  Eduardo [name] Morphine equivalent is 45.00  MME.  Oral Swab was Performed Today.     Pain Inventory Average Pain 9 Pain Right Now 8 My pain is sharp, burning, stabbing and tingling  In the last 24 hours, has pain interfered with the following? General activity 8 Relation with others 8 Enjoyment of life 10 What TIME of day is your pain at its worst? all Sleep (in general) Poor  Pain is worse with: walking, bending, sitting and standing Pain improves with: rest and medication Relief from Meds: 8  Mobility walk without assistance how many minutes can you walk? 5 ability to climb steps?  yes do you drive?  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  no  Physicians involved in your care Any changes since last visit?  no   Family History  Problem Relation Age of Onset  . Cancer Maternal Grandmother        breast, ovarian & colon  . Hypertension Mother   . Diabetes Mother   . Hypertension Father   . Heart disease Father   . Stroke Paternal Grandfather   . Neuropathy Neg Hx    Social History   Socioeconomic History  . Marital status: Divorced    Spouse name: Not on file  . Number of children: 2  . Years of education: 12th   . Highest education level: Not on file  Occupational History  . Occupation: [name]  Tobacco Use  . Smoking status: Current Every Day Smoker   Packs/day: 0.25    Years: 33.00    Pack years: 8.25    Types: Cigarettes  . Smokeless tobacco: Never Used  . Tobacco comment: 06/28/19 1/4 pack per day  Substance and Sexual Activity  . Alcohol use: No    Alcohol/week: 0.0 standard drinks    Comment: Rarely  . Drug use: No  . Sexual activity: Yes    Birth control/protection: None  Other Topics Concern  . Not on file  Social History Narrative   Lives at home w/ his step-mother   Right-handed   Drinks about 2-3 [place] a day   Social Determinants of Health   Financial Resource Strain:   . Difficulty of Paying Living Expenses:   Food Insecurity:   . Worried About Charity fundraiser in the Last Year:   . Arboriculturist in the Last Year:   Transportation Needs:   . Film/video editor (Medical):   [name] Lack of Transportation (Non-Medical):   Physical Activity:   . Days of Exercise per Week:   . Minutes of Exercise per Session:   Stress:   . Feeling of Stress :   Social Connections:   . Frequency of Communication with Friends and Family:   . Frequency of Social Gatherings with Friends and Family:   . Attends Religious Services:   . Active Member of Clubs or  Organizations:   . Attends Archivist Meetings:   [name] Marital Status:    Past Surgical History:  Procedure Laterality Date  . Irrigation and debridement of back abscess    . MASS EXCISION Right 06/14/2019   Procedure: EXCISION BENIGN LESION RIGHT FOOT;  Surgeon: [name], DPM;  Location: [facility];  Service: Podiatry;  Laterality: Right;  . NASAL SEPTOPLASTY W/ TURBINOPLASTY [name] 02/03/2018   Procedure: NASAL SEPTOPLASTY WITH TURBINATE REDUCTION;  Surgeon: [name], MD;  Location: [facility];  Service: ENT;  Laterality: [name];  . PILONIDAL CYST DRAINAGE [name] 04/10/2017   Procedure: IRRIGATION AND DEBRIDEMENT BACK ABSCESS;  Surgeon: [name], MD;  Location: [facility];  Service: General;  Laterality: [name];  . SINUS ENDO WITH FUSION [name] 02/03/2018   Procedure:  ENDOSCOPIC SINUS SURGERY WITH FUSION;  Surgeon: [name], MD;  Location: [facility];  Service: ENT;  Laterality: [name];  . THORACIC OUTLET SURGERY  2000  . TOE ARTHROPLASTY Right 06/14/2019   Procedure: HALLUX ARTHROPLASTY RIGHT FOOT WITH TISSUE TRANSER EXCISION OF SESAMOID BONE;  Surgeon: [name], DPM;  Location: [facility];  Service: Podiatry;  Laterality: Right;  . WRIST FUSION  2002   Past Medical History:  Diagnosis Date  . Anxiety   . Chronic back pain   . Degenerative disk disease   . Depression   . DVT of upper extremity (deep vein thrombosis) ([place])   . GERD (gastroesophageal reflux disease)   . Hypertension   . MRSA (methicillin resistant staph aureus) culture positive   . Peripheral neuropathy   . Pilonidal cyst   . Pulmonary embolus ([place])    no blood thinner 2002  . Sleep apnea    can't use cpap due to deviated nasal septumg   BP (!) 146/83   Pulse 86   Temp 97.7 F (36.5 C)   Ht 6\' 5"  (1.956 m)   Wt (!) 352 lb 12.8 oz (160 kg)   SpO2 92%   BMI 41.84 kg/m   Opioid Risk Score:   Fall Risk Score:  `1  Depression screen PHQ 2/9  Depression screen [facility] 2/9 01/23/2020 01/16/2020 08/28/2019 06/28/2019 05/15/2019 04/25/2019 03/29/2019  Decreased Interest 1 3 0 0 1 0 0  Down, Depressed, Hopeless 1 0 1 0 1 0 0  PHQ - 2 Score 2 3 1  0 2 0 0  Altered sleeping - 3 - - 3 - -  Tired, decreased energy - 3 - - 2 - -  Change in appetite - 3 - - 1 - -  Feeling bad or failure about yourself  - 1 - - 0 - -  Trouble concentrating - 3 - - 2 - -  Moving slowly or fidgety/restless - 0 - - 0 - -  Suicidal thoughts - 0 - - 0 - -  PHQ-9 Score - 16 - - 10 - -  Difficult doing work/chores - Extremely dIfficult - - Somewhat difficult - -  Some recent data might be hidden    Review of Systems  Constitutional: Negative.   HENT: Negative.   Eyes: Negative.   Respiratory: Negative.   Cardiovascular: Negative.   Gastrointestinal: Negative.   Endocrine: Negative.   Genitourinary: Negative.    Musculoskeletal: Positive for back pain and gait problem.       Spasms  Skin: Negative.   Allergic/Immunologic: Negative.   Neurological: Positive for tremors, weakness and numbness.       Tingling  Hematological: Negative.   Psychiatric/Behavioral:  Positive for dysphoric mood. The patient is nervous/anxious.   All other systems reviewed and are negative.      Objective:   Physical Exam Vitals and nursing note reviewed.  Constitutional:      Appearance: Normal appearance. He is obese.  Cardiovascular:     Rate and Rhythm: Normal rate and regular rhythm.     Pulses: Normal pulses.     Heart sounds: Normal heart sounds.  Pulmonary:     Effort: Pulmonary effort is normal.     Breath sounds: Normal breath sounds.  Musculoskeletal:     Cervical back: Normal range of motion and neck supple.     Comments: Normal Muscle Bulk and Muscle Testing Reveals:  Upper Extremities: Full ROM and Muscle Strength 5/5  Lumbar Paraspinal Tenderness: L-3-L-5 Lower Extremities: Full ROM and Muscle Strength 5/5 Arises from Table Slowly Narrow Based Gait  Skin:    General: Skin is warm and dry.  Neurological:     Mental Status: He is alert and oriented to person, place, and time.  Psychiatric:        Mood and Affect: Mood normal.        Behavior: Behavior normal.           Assessment & Plan:  1. Chronic idiopathic axonal polyneuropathy: Continue Lyrica.01/23/2020. 2. Posterior tibial tendinitis of left lower extremity: Continue HEP as Tolerated. Continue to Monitor.01/23/2020 3. Lumbar DDD/ Lumbar Radiculitis: Continue current medication regime and HEP as Tolerated.01/23/2020. 4. Chronic Pain Syndrome:Refilled: Oxycodone7.5/325mg  one tablet4 times daily as needed for pain. PerDr, Kirsteins note he'sunwilling to go any higher given his history in terms of total morphine equivalent dose.01/23/2020 We will continue the opioid monitoring program, this consists of regular clinic  visits, examinations, urine drug screen, pill counts as well as use of [place] Controlled Substance Reporting system.  F/U in1 month

## 2020-01-24 ENCOUNTER — Ambulatory Visit: Payer: BC Managed Care – PPO | Admitting: Physical Medicine and Rehabilitation

## 2020-01-28 LAB — DRUG TOX MONITOR 1 W/CONF, ORAL FLD
Amphetamines: NEGATIVE ng/mL (ref <10)
Barbiturates: NEGATIVE ng/mL (ref <10)
Benzodiazepines: NEGATIVE ng/mL (ref <0.50)
Buprenorphine: NEGATIVE ng/mL (ref <0.10)
Cocaine: NEGATIVE ng/mL (ref <5.0)
Codeine: NEGATIVE ng/mL (ref <2.5)
Cotinine: 57 ng/mL — ABNORMAL HIGH (ref <5.0)
Dihydrocodeine: NEGATIVE ng/mL (ref <2.5)
Fentanyl: NEGATIVE ng/mL (ref <0.10)
Heroin Metabolite: NEGATIVE ng/mL (ref <1.0)
Hydrocodone: NEGATIVE ng/mL (ref <2.5)
Hydromorphone: NEGATIVE ng/mL (ref <2.5)
MARIJUANA: NEGATIVE ng/mL (ref <2.5)
MDMA: NEGATIVE ng/mL (ref <10)
Meprobamate: NEGATIVE ng/mL (ref <2.5)
Methadone: NEGATIVE ng/mL (ref <5.0)
Morphine: NEGATIVE ng/mL (ref <2.5)
Nicotine Metabolite: POSITIVE ng/mL — AB (ref <5.0)
Norhydrocodone: NEGATIVE ng/mL (ref <2.5)
Noroxycodone: 9.7 ng/mL — ABNORMAL HIGH (ref <2.5)
Opiates: POSITIVE ng/mL — AB (ref <2.5)
Oxycodone: 157.7 ng/mL — ABNORMAL HIGH (ref <2.5)
Oxymorphone: NEGATIVE ng/mL (ref <2.5)
Phencyclidine: NEGATIVE ng/mL (ref <10)
Tapentadol: NEGATIVE ng/mL (ref <5.0)
Tramadol: NEGATIVE ng/mL (ref <5.0)
Zolpidem: NEGATIVE ng/mL (ref <5.0)

## 2020-01-28 LAB — DRUG TOX ALC METAB W/CON, ORAL FLD: Alcohol Metabolite: NEGATIVE ng/mL (ref <25)

## 2020-01-29 ENCOUNTER — Telehealth: Payer: Self-pay

## 2020-01-29 NOTE — Telephone Encounter (Signed)
UDS RESULTS CONSISTENT WITH MEDICATIONS ON FILE  

## 2020-01-30 ENCOUNTER — Encounter (HOSPITAL_COMMUNITY): Payer: Self-pay

## 2020-01-31 ENCOUNTER — Ambulatory Visit (HOSPITAL_COMMUNITY): Payer: BC Managed Care – PPO

## 2020-02-12 ENCOUNTER — Telehealth (HOSPITAL_COMMUNITY): Payer: Self-pay | Admitting: Emergency Medicine

## 2020-02-12 NOTE — Telephone Encounter (Signed)
Reaching out to patient to offer assistance regarding upcoming cardiac imaging study; pt verbalizes understanding of appt date/time, parking situation and where to check in, pre-test NPO status and medications ordered, and verified current allergies; name and call back number provided for further questions should they arise Eiza Canniff RN Navigator Cardiac Imaging [facility] [phone] office [phone] cell 

## 2020-02-13 ENCOUNTER — Ambulatory Visit (HOSPITAL_COMMUNITY)
Admission: RE | Admit: 2020-02-13 | Discharge: 2020-02-13 | Disposition: A | Discharge status: Home / Self Care | Payer: BC Managed Care – PPO | Source: Ambulatory Visit | Attending: Cardiology | Admitting: Cardiology

## 2020-02-13 ENCOUNTER — Other Ambulatory Visit: Payer: Self-pay

## 2020-02-13 DIAGNOSIS — R079 Chest pain, unspecified: Secondary | ICD-10-CM | POA: Insufficient documentation

## 2020-02-13 MED ORDER — METOPROLOL TARTRATE 5 MG/5ML IV SOLN
INTRAVENOUS | Status: AC
  Filled 2020-02-13: qty 5

## 2020-02-13 MED ORDER — NITROGLYCERIN 0.4 MG SL SUBL
SUBLINGUAL_TABLET | SUBLINGUAL | Status: AC
  Filled 2020-02-13: qty 2

## 2020-02-13 MED ORDER — METOPROLOL TARTRATE 5 MG/5ML IV SOLN
5.0000 mg | INTRAVENOUS | Status: DC | PRN
  Administered 2020-02-13: 5 mg via INTRAVENOUS

## 2020-02-13 MED ORDER — NITROGLYCERIN 0.4 MG SL SUBL
0.8000 mg | SUBLINGUAL_TABLET | Freq: Once | SUBLINGUAL | Status: AC
  Administered 2020-02-13: 0.8 mg via SUBLINGUAL

## 2020-02-13 MED ORDER — METOPROLOL TARTRATE 5 MG/5ML IV SOLN
INTRAVENOUS | Status: AC
  Filled 2020-02-13: qty 10

## 2020-02-13 MED ORDER — IOHEXOL 350 MG/ML SOLN
100.0000 mL | Freq: Once | INTRAVENOUS | Status: AC | PRN
  Administered 2020-02-13: 100 mL via INTRAVENOUS

## 2020-02-13 NOTE — Progress Notes (Signed)
Pt tolerated procedure well.  IV dc, ambulated to main entrance for dc home

## 2020-02-15 ENCOUNTER — Telehealth: Payer: Self-pay | Admitting: *Deleted

## 2020-02-15 NOTE — Telephone Encounter (Signed)
Patient left a message stating that he will run out of pain meds before next appt with Dr. [name]. Dr. [name] is on vacation

## 2020-02-16 MED ORDER — OXYCODONE-ACETAMINOPHEN 7.5-325 MG PO TABS: 1.0000 | ORAL_TABLET | Freq: Four times a day (QID) | ORAL | 0 refills | Status: DC | PRN

## 2020-02-16 NOTE — Telephone Encounter (Signed)
PMP was Reviewed: Last Oxycodone was filled on 01/25/2020. His appointment with Dr Octavio Manns is on 02/28/2020. Oxycodone e-scribed today.

## 2020-02-20 ENCOUNTER — Telehealth: Payer: Self-pay | Admitting: Cardiology

## 2020-02-20 MED ORDER — ATORVASTATIN CALCIUM 20 MG PO TABS
20.0000 mg | ORAL_TABLET | Freq: Every day | ORAL | 3 refills | Status: DC
Start: 2020-02-20 — End: 2020-04-03

## 2020-02-20 NOTE — Telephone Encounter (Signed)
Pt updated with cardiac ct results and verbalized understanding. Pt state he is having labwork done at pcp this Friday and will have them fax results. New orders placed.

## 2020-02-20 NOTE — Telephone Encounter (Signed)
Patient states he is returning a call to [facility] to discuss results from CT completed on 02/13/20. Attempted to contact [place] with no success. Please return call to discuss.

## 2020-02-21 ENCOUNTER — Other Ambulatory Visit: Payer: Self-pay | Admitting: Physician Assistant

## 2020-02-21 DIAGNOSIS — I10 Essential (primary) hypertension: Secondary | ICD-10-CM

## 2020-02-21 DIAGNOSIS — Z0000 Encounter for general adult medical examination without abnormal findings: Secondary | ICD-10-CM

## 2020-02-23 ENCOUNTER — Other Ambulatory Visit: Payer: Self-pay

## 2020-02-23 ENCOUNTER — Other Ambulatory Visit: Payer: BC Managed Care – PPO

## 2020-02-23 DIAGNOSIS — I10 Essential (primary) hypertension: Secondary | ICD-10-CM

## 2020-02-23 DIAGNOSIS — Z0000 Encounter for general adult medical examination without abnormal findings: Secondary | ICD-10-CM

## 2020-02-24 LAB — COMPREHENSIVE METABOLIC PANEL
ALT: 14 IU/L (ref 0–44)
AST: 16 IU/L (ref 0–40)
Albumin/Globulin Ratio: 1.6 (ref 1.2–2.2)
Albumin: 4.5 g/dL (ref 3.8–4.9)
Alkaline Phosphatase: 146 IU/L — ABNORMAL HIGH (ref 39–117)
BUN/Creatinine Ratio: 15 (ref 9–20)
BUN: 20 mg/dL (ref 6–24)
Bilirubin Total: 0.6 mg/dL (ref 0.0–1.2)
CO2: 23 mmol/L (ref 20–29)
Calcium: 9.5 mg/dL (ref 8.7–10.2)
Chloride: 98 mmol/L (ref 96–106)
Creatinine, Ser: 1.34 mg/dL — ABNORMAL HIGH (ref 0.76–1.27)
GFR calc Af Amer: 70 mL/min/{1.73_m2} (ref >59)
GFR calc non Af Amer: 61 mL/min/{1.73_m2} (ref >59)
Globulin, Total: 2.8 g/dL (ref 1.5–4.5)
Glucose: 95 mg/dL (ref 65–99)
Potassium: 4.5 mmol/L (ref 3.5–5.2)
Sodium: 136 mmol/L (ref 134–144)
Total Protein: 7.3 g/dL (ref 6.0–8.5)

## 2020-02-24 LAB — LIPID PANEL
Chol/HDL Ratio: 4.8 ratio (ref 0.0–5.0)
Cholesterol, Total: 92 mg/dL — ABNORMAL LOW (ref 100–199)
HDL: 19 mg/dL — ABNORMAL LOW (ref >39)
LDL Chol Calc (NIH): 28 mg/dL (ref 0–99)
Triglycerides: 299 mg/dL — ABNORMAL HIGH (ref 0–149)
VLDL Cholesterol Cal: 45 mg/dL — ABNORMAL HIGH (ref 5–40)

## 2020-02-24 LAB — HEMOGLOBIN A1C
Est. average glucose Bld gHb Est-mCnc: 97 mg/dL
Hgb A1c MFr Bld: 5 % (ref 4.8–5.6)

## 2020-02-24 LAB — CBC
Hematocrit: 46.1 % (ref 37.5–51.0)
Hemoglobin: 16 g/dL (ref 13.0–17.7)
MCH: 32.8 pg (ref 26.6–33.0)
MCHC: 34.7 g/dL (ref 31.5–35.7)
MCV: 95 fL (ref 79–97)
Platelets: 181 10*3/uL (ref 150–450)
RBC: 4.88 x10E6/uL (ref 4.14–5.80)
RDW: 15 % (ref 11.6–15.4)
WBC: 8.9 10*3/uL (ref 3.4–10.8)

## 2020-02-24 LAB — TSH: TSH: 1.68 u[IU]/mL (ref 0.450–4.500)

## 2020-02-27 ENCOUNTER — Encounter: Payer: Self-pay | Admitting: Physician Assistant

## 2020-02-27 ENCOUNTER — Ambulatory Visit (INDEPENDENT_AMBULATORY_CARE_PROVIDER_SITE_OTHER): Payer: BC Managed Care – PPO | Admitting: Physician Assistant

## 2020-02-27 ENCOUNTER — Other Ambulatory Visit: Payer: Self-pay

## 2020-02-27 VITALS — BP 137/73 | HR 86 | Temp 98.0°F | Ht 77.0 in | Wt 358.0 lb

## 2020-02-27 DIAGNOSIS — Z0000 Encounter for general adult medical examination without abnormal findings: Secondary | ICD-10-CM

## 2020-02-27 DIAGNOSIS — Z1283 Encounter for screening for malignant neoplasm of skin: Secondary | ICD-10-CM

## 2020-02-27 DIAGNOSIS — F17200 Nicotine dependence, unspecified, uncomplicated: Secondary | ICD-10-CM

## 2020-02-27 DIAGNOSIS — I10 Essential (primary) hypertension: Secondary | ICD-10-CM

## 2020-02-27 DIAGNOSIS — Z8659 Personal history of other mental and behavioral disorders: Secondary | ICD-10-CM

## 2020-02-27 DIAGNOSIS — E781 Pure hyperglyceridemia: Secondary | ICD-10-CM

## 2020-02-27 NOTE — Progress Notes (Signed)
Male physical   Impression and Recommendations:    1. Healthcare maintenance   2. Hypertension, unspecified type   3. Morbid obesity ([place])   4. TOBACCO ABUSE   5. History of ADHD   6. Skin cancer screening      1) Anticipatory Guidance: Discussed skin CA prevention and sunscreen when outside along with skin surveillance; eating a balanced and modest diet; physical activity at least 25 minutes per day or minimum of 150 min/ week moderate to intense activity. - Placed dermatology referral for annual skin cancer screening.   2) Immunizations / Screenings / Labs:   All immunizations are up-to-date per recommendations or will be updated today if pt allows.    - Patient understands with dental and vision screens they will schedule independently.  - Will obtain CBC, CMP, HgA1c, Lipid panel, and TSH when fasting, if not already done past 12 mo/ recently  - Tdap UTD - Pt declined Zoster vaccine. - HIV screening UTD: 02/03/18 - Hep C screening UTD: 12/20/16 - Encourage to establish with dentist and ophthalmologist.    3) Weight:  BMI meaning discussed with patient.  Discussed goal to improve diet habits to improve overall feelings of well being and objective health data. Improve nutrient density of diet through increasing intake of fruits and vegetables and decreasing saturated fats, white flour products and refined sugars.  - Encourage to stay as active as possible and improve diet to help with some weight loss.   4) Hypertension: BP today 137/73 and HR 86. Will continue to monitor.  5) Tobacco Abuse: Currently on Chantix and plans to quit later this week. Has greatly reduced smoking and will start nicotine patches to help with nicotine cravings.   6) H/o ADHD: Placed referral for re-evaluation and management.   7) Hypertriglyceridemia: Most recent Lipid panel- decreased total cholesterol, increased triglycerides and decreased HLD. Pt is followed by cardiology and recently started on  atorvastatin 20 mg. Will recheck lipid panel and hepatic function in 6 weeks and forward results to cardiologist.     No orders of the defined types were placed in this encounter.   No orders of the defined types were placed in this encounter.    Return for Tobacco cessation, HLD- recheck lipid panel, CMP in 6 weeks.   Reminded pt important of f-up preventative CPE in 1 year, this is in addition to any chronic care visits.    Gross side effects, risk and benefits, and alternatives of medications discussed with patient.  Patient is aware that all medications have potential side effects and we are unable to predict every side effect or drug-drug interaction that may occur.  Expresses verbal understanding and consents to current therapy plan and treatment regimen.  Please see AVS handed out to patient at the end of our visit for further patient instructions/ counseling done pertaining to today's office visit.    Subjective:     CC: CPE   HPI: Eduardo [name] is a 49 y.o. male who presents to [facility] today for a yearly health maintenance exam.     Health Maintenance Summary  - Reviewed and updated, unless pt declines services.  Last Cologuard or Colonoscopy:  Ordered Family history of Colon CA: No  Tobacco History Reviewed: Yes, currently on Chantix and has greatly reduced use Alcohol / drug use:  Reports no excessive use / no use Exercise Habits:  not meeting AHA guidelines. Per pt has very active job. Dental Home:  No Eye exams: No Dermatology home: No, placed referral.  Male history: STD concerns:   none, monogamous Additional penile/ urinary concerns: none   Immunization History  Administered Date(s) Administered  . Tdap 07/30/2017     Health Maintenance  Topic Date Due  . URINE MICROALBUMIN  Never done  . COVID-19 Vaccine (1) Never done  . COLONOSCOPY  03/22/2020 (Originally 12/22/2018)  . INFLUENZA VACCINE  05/19/2020  .  TETANUS/TDAP  07/31/2027  . HIV Screening  Completed       Wt Readings from Last 3 Encounters:  02/27/20 (!) 358 lb (162.4 kg)  01/23/20 (!) 352 lb 12.8 oz (160 kg)  01/16/20 (!) 348 lb 14.4 oz (158.3 kg)   BP Readings from Last 3 Encounters:  02/27/20 137/73  02/13/20 (!) 110/59  01/23/20 (!) 146/83   Pulse Readings from Last 3 Encounters:  02/27/20 86  01/23/20 86  01/16/20 93    Patient Active Problem List   Diagnosis Date Noted  . Ulcer of great toe, right, with fat layer exposed ([place])   . Surgery, elective 05/16/2019  . Acute idiopathic gout of multiple sites 03/23/2019  . Recurrent infections 03/07/2019  . Blepharitis of right upper eyelid 03/07/2019  . Body mass index 40.0-44.9, adult ([place]) 04/19/2018  . Pain in left foot 04/19/2018  . Shortness of breath 04/07/2018  . Lower extremity edema 04/07/2018  . Stasis dermatitis of both legs 04/07/2018  . Hypokalemia 04/07/2018  . Healthcare maintenance 04/07/2018  . Great toe pain, left 03/08/2018  . Acute gout involving toe of left foot 03/08/2018  . Low libido 02/09/2018  . OSA on CPAP 02/03/2018  . Status post nasal septoplasty 02/03/2018  . Chronic pansinusitis 12/07/2017  . Hypertrophy, nasal, turbinate 12/07/2017  . Deviated septum 11/11/2017  . Umbilical hernia without obstruction and without gangrene 11/11/2017  . Scrotal pain 11/11/2017  . Scrotal cyst 11/11/2017  . Screening, lipid 11/11/2017  . Screening for diabetes mellitus (DM) 11/11/2017  . Screening for thyroid disorder 11/11/2017  . Anxiety and depression 08/11/2017  . Acute maxillary sinusitis 08/11/2017  . 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 Lumbar degenerative disc disease 08/02/2017  . Acute left ankle pain 07/12/2017  . Skin infection 06/03/2017  . Chronic folliculitis [id number]  . [place] 04/10/2017  . Cellulitis and abscess of trunk s/p I&D 04/10/2017 04/06/2017  . History of MRSA infection 04/06/2017  .  Physical exam, annual 03/03/2017  . Neuropathy 09/28/2015  . Major depression 12/26/2012  . Generalized anxiety disorder 12/26/2012  . Opiate dependence ([place]) 12/24/2012  . Benzodiazepine dependence ([place]) 12/24/2012  . HTN (hypertension) 12/24/2012  . Pilonidal cyst 11/22/2012  . Morbid obesity ([place]) 09/09/2010  . TOBACCO ABUSE 09/09/2010  . LOW BACK PAIN, CHRONIC 09/09/2010    Past Medical History:  Diagnosis Date  . Anxiety   . Chronic back pain   . Degenerative disk disease   . Depression   . DVT of upper extremity (deep vein thrombosis) ([place])   . GERD (gastroesophageal reflux disease)   . Hypertension   . MRSA (methicillin resistant staph aureus) culture positive   . Peripheral neuropathy   . Pilonidal cyst   . Pulmonary embolus ([place])    no blood thinner 2002  . Sleep apnea    can't use cpap due to deviated nasal septumg    Past Surgical History:  Procedure Laterality Date  . Irrigation and debridement of back abscess    . MASS EXCISION Right 06/14/2019   Procedure: EXCISION  BENIGN LESION RIGHT FOOT;  Surgeon: [name], DPM;  Location: [facility];  Service: Podiatry;  Laterality: Right;  . NASAL SEPTOPLASTY W/ TURBINOPLASTY [name] 02/03/2018   Procedure: NASAL SEPTOPLASTY WITH TURBINATE REDUCTION;  Surgeon: [name], MD;  Location: [place] [facility];  Service: ENT;  Laterality: [name];  . PILONIDAL CYST DRAINAGE [name] 04/10/2017   Procedure: IRRIGATION AND DEBRIDEMENT BACK ABSCESS;  Surgeon: [name], MD;  Location: [facility];  Service: General;  Laterality: [name];  . SINUS ENDO WITH FUSION [name] 02/03/2018   Procedure: ENDOSCOPIC SINUS SURGERY WITH FUSION;  Surgeon: [name], MD;  Location: [facility];  Service: ENT;  Laterality: [name];  . THORACIC OUTLET SURGERY  2000  . TOE ARTHROPLASTY Right 06/14/2019   Procedure: HALLUX ARTHROPLASTY RIGHT FOOT WITH TISSUE TRANSER EXCISION OF SESAMOID BONE;  Surgeon: [name], DPM;  Location: [facility];  Service: Podiatry;  Laterality: Right;  .  WRIST FUSION  2002    Family History  Problem Relation Age of Onset  . Cancer Maternal Grandmother        breast, ovarian & colon  . Hypertension Mother   . Diabetes Mother   . Hypertension Father   . Heart disease Father   . Stroke Paternal Grandfather   . Neuropathy Neg Hx     Social History   Substance and Sexual Activity  Drug Use No  ,  Social History   Substance and Sexual Activity  Alcohol Use No  . Alcohol/week: 0.0 standard drinks   Comment: Rarely  ,  Social History   Tobacco Use  Smoking Status Current Every Day Smoker  . Packs/day: 0.25  . Years: 33.00  . Pack years: 8.25  . Types: Cigarettes  Smokeless Tobacco Never Used  Tobacco Comment   06/28/19 1/4 pack per day  ,  Social History   Substance and Sexual Activity  Sexual Activity Yes  . Birth control/protection: None    Patient's Medications  New Prescriptions   No medications on file  Previous Medications   ACETAMINOPHEN (TYLENOL ARTHRITIS PAIN PO)    Take 600 mg by mouth 3 (three) times daily.   ATORVASTATIN (LIPITOR) 20 MG TABLET    Take 1 tablet (20 mg total) by mouth daily.   ESCITALOPRAM (LEXAPRO) 5 MG TABLET    Take 1 tablet (5 mg total) by mouth daily.   FLUTICASONE (FLONASE) 50 MCG/ACT NASAL SPRAY    1 spray each nostril twice daily after sinus rinses   MONTELUKAST (SINGULAIR) 10 MG TABLET    Take 1 tablet (10 mg total) by mouth at bedtime.   NUTRITIONAL SUPPLEMENTS (JUICE PLUS FIBRE PO)    Take 4 each by mouth daily with lunch.   OXYCODONE-ACETAMINOPHEN (PERCOCET) 7.5-325 MG TABLET    Take 1 tablet by mouth every 6 (six) hours as needed.   PREGABALIN (LYRICA) 300 MG CAPSULE    Take 1 capsule (300 mg total) by mouth 2 (two) times daily.   SULFAMETHOXAZOLE-TRIMETHOPRIM (BACTRIM DS) 800-160 MG TABLET    Take 1 tablet by mouth 2 (two) times daily.   VARENICLINE (CHANTIX STARTING MONTH PAK) 0.5 MG X 11 & 1 MG X 42 TABLET    Take one 0.5mg  tablet by mouth once daily for 3 days, then increase  to one 0.5mg  tablet twice daily for 3 days, then increase to one 1mg  tablet twice daily.   VARENICLINE (CHANTIX) 1 MG TABLET    Take 1 tablet (1 mg total) by mouth 2 (two) times daily.  Modified Medications   No medications on file  Discontinued Medications   No medications on file    Pollen extract, Dust mite extract, and Other  Review of Systems: General:   Denies fever, chills, unexplained weight loss.  Optho/Auditory:   Denies visual changes, blurred vision/LOV Respiratory:   Denies SOB, DOE more than baseline levels.   Cardiovascular:   Denies chest pain, palpitations, new onset peripheral edema  Gastrointestinal:   Denies nausea, vomiting, diarrhea.  Genitourinary: Denies dysuria, freq/ urgency, flank pain or discharge from genitals.  Endocrine:     Denies hot or cold intolerance, polyuria, polydipsia. Musculoskeletal:   Denies unexplained myalgias, joint swelling, unexplained arthralgias, gait problems.  Skin:  Denies rash, suspicious lesions Neurological:  Denies dizziness, unexplained weakness, syncope,+ difficulty concentrating  Psychiatric/Behavioral:  Denies mood changes, suicidal or homicidal ideations, hallucinations    Objective:     Blood pressure 137/73, pulse 86, temperature 98 F (36.7 C), temperature source Oral, height 6\' 5"  (1.956 m), weight (!) 358 lb (162.4 kg), SpO2 97 %. Body mass index is 42.45 kg/m. General Appearance:    Alert, cooperative, no distress, appears stated age  Head:    Normocephalic, without obvious abnormality, atraumatic  Eyes:    PERRL, conjunctiva/corneas clear, EOM's intact, fundi    benign, both eyes  Ears:    Normal TM and external ear canal of L ear, cerumen impaction noted of R ear  Nose:   Nares normal, septum midline, mucosa normal, no drainage    or sinus tenderness  Throat:   Lips w/o lesion, mucosa moist, and tongue normal; abnl dentition   Neck:   Supple, symmetrical, trachea midline, no adenopathy;    thyroid:  no  enlargement/tenderness/nodules; no carotid   bruit or JVD  Back:     Symmetric, no curvature, ROM normal, no CVA tenderness  Lungs:     Clear to auscultation bilaterally, respirations unlabored, no Wh/ R/ R  Chest Wall:    No tenderness or gross deformity; normal excursion   Heart:    Regular rate and rhythm, S1 and S2 normal, no murmur, rub   or gallop  Abdomen:     Protuberant,Soft, non-tender, bowel sounds active all four quadrants, No G/R/R, no masses, no organomegaly  Genitalia:   Deferred.  Rectal:   Deferred.  Extremities:   Extremities normal, atraumatic, no cyanosis, LE edema noted.  Pulses:   2+ and symmetric all extremities  Skin:   Warm, dry, Skin color, texture, turgor normal, no obvious/suspicious rashes or lesions  M-Sk:   Ambulates * 4 w/o difficulty, no gross deformities, tone WNL  Neurologic:   CNII-XII intact, normal strength, sensation and reflexes    Throughout Psych:  No HI/SI, judgement and insight good, Euthymic mood.Stable Affect.

## 2020-02-27 NOTE — Patient Instructions (Signed)
Preventive Care 41-51 Years Old, Male Preventive care refers to lifestyle choices and visits with your health care provider that can promote health and wellness. This includes:  A yearly physical exam. This is also called an annual well check.  Regular dental and eye exams.  Immunizations.  Screening for certain conditions.  Healthy lifestyle choices, such as eating a healthy diet, getting regular exercise, not using drugs or products that contain nicotine and tobacco, and limiting alcohol use. What can I expect for my preventive care visit? Physical exam Your health care provider will check:  Height and weight. These may be used to calculate body mass index (BMI), which is a measurement that tells if you are at a healthy weight.  Heart rate and blood pressure.  Your skin for abnormal spots. Counseling Your health care provider may ask you questions about:  Alcohol, tobacco, and drug use.  Emotional well-being.  Home and relationship well-being.  Sexual activity.  Eating habits.  Work and work Statistician. What immunizations do I need?  Influenza (flu) vaccine  This is recommended every year. Tetanus, diphtheria, and pertussis (Tdap) vaccine  You may need a Td booster every 10 years. Varicella (chickenpox) vaccine  You may need this vaccine if you have not already been vaccinated. Zoster (shingles) vaccine  You may need this after age 64. Measles, mumps, and rubella (MMR) vaccine  You may need at least one dose of MMR if you were born in 1957 or later. You may also need a second dose. Pneumococcal conjugate (PCV13) vaccine  You may need this if you have certain conditions and were not previously vaccinated. Pneumococcal polysaccharide (PPSV23) vaccine  You may need one or two doses if you smoke cigarettes or if you have certain conditions. Meningococcal conjugate (MenACWY) vaccine  You may need this if you have certain conditions. Hepatitis A  vaccine  You may need this if you have certain conditions or if you travel or work in places where you may be exposed to hepatitis A. Hepatitis B vaccine  You may need this if you have certain conditions or if you travel or work in places where you may be exposed to hepatitis B. Haemophilus influenzae type b (Hib) vaccine  You may need this if you have certain risk factors. Human papillomavirus (HPV) vaccine  If recommended by your health care provider, you may need three doses over 6 months. You may receive vaccines as individual doses or as more than one vaccine together in one shot (combination vaccines). Talk with your health care provider about the risks and benefits of combination vaccines. What tests do I need? Blood tests  Lipid and cholesterol levels. These may be checked every 5 years, or more frequently if you are over 60 years old.  Hepatitis C test.  Hepatitis B test. Screening  Lung cancer screening. You may have this screening every year starting at age 43 if you have a 30-pack-year history of smoking and currently smoke or have quit within the past 15 years.  Prostate cancer screening. Recommendations will vary depending on your family history and other risks.  Colorectal cancer screening. All adults should have this screening starting at age 72 and continuing until age 2. Your health care provider may recommend screening at age 14 if you are at increased risk. You will have tests every 1-10 years, depending on your results and the type of screening test.  Diabetes screening. This is done by checking your blood sugar (glucose) after you have not eaten  for a while (fasting). You may have this done every 1-3 years.  Sexually transmitted disease (STD) testing. Follow these instructions at home: Eating and drinking  Eat a diet that includes fresh fruits and vegetables, whole grains, lean protein, and low-fat dairy products.  Take vitamin and mineral supplements as  recommended by your health care provider.  Do not drink alcohol if your health care provider tells you not to drink.  If you drink alcohol: ? Limit how much you have to 0-2 drinks a day. ? Be aware of how much alcohol is in your drink. In the [place] one drink equals one 12 oz bottle of beer (355 mL), one 5 oz glass of wine (148 mL), or one 1 oz glass of hard liquor (44 mL). Lifestyle  Take daily care of your teeth and gums.  Stay active. Exercise for at least 30 minutes on 5 or more days each week.  Do not use any products that contain nicotine or tobacco, such as cigarettes, e-cigarettes, and chewing tobacco. If you need help quitting, ask your health care provider.  If you are sexually active, practice safe sex. Use a condom or other form of protection to prevent STIs (sexually transmitted infections).  Talk with your health care provider about taking a low-dose aspirin every day starting at age 53. What's next?  Go to your health care provider once a year for a well check visit.  Ask your health care provider how often you should have your eyes and teeth checked.  Stay up to date on all vaccines. This information is not intended to replace advice given to you by your health care provider. Make sure you discuss any questions you have with your health care provider. Document Revised: 09/29/2018 Document Reviewed: 09/29/2018 Elsevier Patient Education  2020 [employer].

## 2020-02-28 ENCOUNTER — Encounter
Payer: BC Managed Care – PPO | Attending: Physical Medicine & Rehabilitation | Admitting: Physical Medicine and Rehabilitation

## 2020-02-28 DIAGNOSIS — G609 Hereditary and idiopathic neuropathy, unspecified: Secondary | ICD-10-CM | POA: Insufficient documentation

## 2020-02-28 DIAGNOSIS — Z79891 Long term (current) use of opiate analgesic: Secondary | ICD-10-CM | POA: Insufficient documentation

## 2020-02-28 DIAGNOSIS — M79671 Pain in right foot: Secondary | ICD-10-CM | POA: Insufficient documentation

## 2020-02-28 DIAGNOSIS — M79672 Pain in left foot: Secondary | ICD-10-CM | POA: Insufficient documentation

## 2020-02-28 DIAGNOSIS — M25562 Pain in left knee: Secondary | ICD-10-CM | POA: Insufficient documentation

## 2020-02-28 DIAGNOSIS — G8929 Other chronic pain: Secondary | ICD-10-CM | POA: Insufficient documentation

## 2020-02-28 DIAGNOSIS — M21611 Bunion of right foot: Secondary | ICD-10-CM | POA: Insufficient documentation

## 2020-02-28 DIAGNOSIS — Z79899 Other long term (current) drug therapy: Secondary | ICD-10-CM | POA: Insufficient documentation

## 2020-02-28 DIAGNOSIS — M21612 Bunion of left foot: Secondary | ICD-10-CM | POA: Insufficient documentation

## 2020-02-28 DIAGNOSIS — R600 Localized edema: Secondary | ICD-10-CM | POA: Insufficient documentation

## 2020-02-28 DIAGNOSIS — R0601 Orthopnea: Secondary | ICD-10-CM | POA: Insufficient documentation

## 2020-02-28 DIAGNOSIS — E538 Deficiency of other specified B group vitamins: Secondary | ICD-10-CM | POA: Insufficient documentation

## 2020-02-28 DIAGNOSIS — G608 Other hereditary and idiopathic neuropathies: Secondary | ICD-10-CM | POA: Insufficient documentation

## 2020-02-28 DIAGNOSIS — G894 Chronic pain syndrome: Secondary | ICD-10-CM | POA: Insufficient documentation

## 2020-02-28 DIAGNOSIS — Z5181 Encounter for therapeutic drug level monitoring: Secondary | ICD-10-CM | POA: Insufficient documentation

## 2020-03-13 ENCOUNTER — Other Ambulatory Visit: Payer: Self-pay

## 2020-03-13 ENCOUNTER — Encounter: Payer: Self-pay | Admitting: Physical Medicine and Rehabilitation

## 2020-03-13 ENCOUNTER — Encounter: Payer: BC Managed Care – PPO | Admitting: Physical Medicine and Rehabilitation

## 2020-03-13 VITALS — BP 125/84 | HR 92 | Temp 97.5°F | Ht 77.0 in | Wt 364.8 lb

## 2020-03-13 DIAGNOSIS — G609 Hereditary and idiopathic neuropathy, unspecified: Secondary | ICD-10-CM | POA: Diagnosis not present

## 2020-03-13 DIAGNOSIS — G8929 Other chronic pain: Secondary | ICD-10-CM

## 2020-03-13 DIAGNOSIS — Z5181 Encounter for therapeutic drug level monitoring: Secondary | ICD-10-CM | POA: Diagnosis present

## 2020-03-13 DIAGNOSIS — Z79899 Other long term (current) drug therapy: Secondary | ICD-10-CM | POA: Diagnosis present

## 2020-03-13 DIAGNOSIS — M79672 Pain in left foot: Secondary | ICD-10-CM | POA: Diagnosis not present

## 2020-03-13 DIAGNOSIS — M25572 Pain in left ankle and joints of left foot: Secondary | ICD-10-CM

## 2020-03-13 DIAGNOSIS — M21612 Bunion of left foot: Secondary | ICD-10-CM | POA: Diagnosis not present

## 2020-03-13 DIAGNOSIS — M25571 Pain in right ankle and joints of right foot: Secondary | ICD-10-CM

## 2020-03-13 DIAGNOSIS — M25562 Pain in left knee: Secondary | ICD-10-CM | POA: Diagnosis present

## 2020-03-13 DIAGNOSIS — R600 Localized edema: Secondary | ICD-10-CM | POA: Diagnosis present

## 2020-03-13 DIAGNOSIS — M21611 Bunion of right foot: Secondary | ICD-10-CM | POA: Diagnosis not present

## 2020-03-13 DIAGNOSIS — E538 Deficiency of other specified B group vitamins: Secondary | ICD-10-CM | POA: Diagnosis present

## 2020-03-13 DIAGNOSIS — G608 Other hereditary and idiopathic neuropathies: Secondary | ICD-10-CM | POA: Diagnosis not present

## 2020-03-13 DIAGNOSIS — M79671 Pain in right foot: Secondary | ICD-10-CM | POA: Diagnosis not present

## 2020-03-13 DIAGNOSIS — G894 Chronic pain syndrome: Secondary | ICD-10-CM | POA: Diagnosis present

## 2020-03-13 DIAGNOSIS — Z6841 Body Mass Index (BMI) 40.0 and over, adult: Secondary | ICD-10-CM

## 2020-03-13 DIAGNOSIS — R0601 Orthopnea: Secondary | ICD-10-CM | POA: Diagnosis present

## 2020-03-13 DIAGNOSIS — Z79891 Long term (current) use of opiate analgesic: Secondary | ICD-10-CM | POA: Diagnosis present

## 2020-03-13 MED ORDER — HYDROCODONE-ACETAMINOPHEN 7.5-325 MG PO TABS: 1.0000 | ORAL_TABLET | Freq: Every day | ORAL | 0 refills | Status: DC | PRN

## 2020-03-13 MED ORDER — OXYCODONE-ACETAMINOPHEN 7.5-325 MG PO TABS: 1.0000 | ORAL_TABLET | Freq: Every day | ORAL | 0 refills | Status: DC | PRN

## 2020-03-13 MED ORDER — PREGABALIN 300 MG PO CAPS: 300.0000 mg | ORAL_CAPSULE | Freq: Two times a day (BID) | ORAL | 2 refills | Status: DC

## 2020-03-13 NOTE — Addendum Note (Signed)
Addended by: [name] on: 03/13/2020 03:52 PM   Modules accepted: Orders

## 2020-03-13 NOTE — Progress Notes (Signed)
Subjective:    Patient ID: Eduardo [name], male    DOB: 1971-07-20, 49 y.o.   MRN: [id number]  HPI  HPI: Eduardo [name] is a 49 y.o. male who returns for follow up appointment for chronic pain and medication refill. He states his pain is located in his lower back radiating into his bilateral lower extremities and bilateral feet. He rates his pain 9. His current exercise regime is walking and performing stretching exercises.   Mr. Urgiles Morphine equivalent is 45.00  MME.  Pain Inventory Average Pain 9 Pain Right Now 7 My pain is intermittent, constant, sharp, stabbing, tingling and aching  In the last 24 hours, has pain interfered with the following? General activity 9 Relation with others 9 Enjoyment of life 10 What TIME of day is your pain at its worst? all Sleep (in general) Poor  Pain is worse with: walking, sitting, inactivity, standing and some activites Pain improves with: rest and medication Relief from Meds: 9  Mobility walk without assistance how many minutes can you walk? 3 ability to climb steps?  yes do you drive?  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  no  Physicians involved in your care Any changes since last visit?  no   Family History  Problem Relation Age of Onset  . Cancer Maternal Grandmother        breast, ovarian & colon  . Hypertension Mother   . Diabetes Mother   . Hypertension Father   . Heart disease Father   . Stroke Paternal Grandfather   . Neuropathy Neg Hx    Social History   Socioeconomic History  . Marital status: Divorced    Spouse name: Not on file  . Number of children: 2  . Years of education: 12th   . Highest education level: Not on file  Occupational History  . Occupation: [name]  Tobacco Use  . Smoking status: Current  Every Day Smoker    Packs/day: 0.25    Years: 33.00    Pack years: 8.25    Types: Cigarettes  . Smokeless tobacco: Never Used  . Tobacco comment: 06/28/19 1/4 pack per day  Substance and Sexual Activity  . Alcohol use: No    Alcohol/week: 0.0 standard drinks    Comment: Rarely  . Drug use: No  . Sexual activity: Yes    Birth control/protection: None  Other Topics Concern  . Not on file  Social History Narrative   Lives at home w/ his step-mother   Right-handed   Drinks about 2-3 [place] a day   Social Determinants of Health   Financial Resource Strain:   . Difficulty of Paying Living Expenses:   Food Insecurity:   . Worried About Charity fundraiser in the Last Year:   . Arboriculturist in the Last Year:   Transportation Needs:   . Film/video editor (Medical):   [name] Lack of Transportation (Non-Medical):   Physical Activity:   . Days of Exercise per Week:   . Minutes of Exercise per Session:   Stress:   . Feeling of Stress :   Social Connections:   . Frequency of Communication with Friends and Family:   . Frequency of Social Gatherings with Friends and Family:   . Attends Religious Services:   . Active Member of Clubs  or Organizations:   . Attends Archivist Meetings:   [name] Marital Status:    Past Surgical History:  Procedure Laterality Date  . Irrigation and debridement of back abscess    . MASS EXCISION Right 06/14/2019   Procedure: EXCISION BENIGN LESION RIGHT FOOT;  Surgeon: [name], DPM;  Location: [facility];  Service: Podiatry;  Laterality: Right;  . NASAL SEPTOPLASTY W/ TURBINOPLASTY [name] 02/03/2018   Procedure: NASAL SEPTOPLASTY WITH TURBINATE REDUCTION;  Surgeon: [name], MD;  Location: [facility];  Service: ENT;  Laterality: [name];  . PILONIDAL CYST DRAINAGE [name] 04/10/2017   Procedure: IRRIGATION AND DEBRIDEMENT BACK ABSCESS;  Surgeon: [name], MD;  Location: [facility];  Service: General;  Laterality: [name];  . SINUS ENDO WITH FUSION [name]  02/03/2018   Procedure: ENDOSCOPIC SINUS SURGERY WITH FUSION;  Surgeon: [name], MD;  Location: [facility];  Service: ENT;  Laterality: [name];  . THORACIC OUTLET SURGERY  2000  . TOE ARTHROPLASTY Right 06/14/2019   Procedure: HALLUX ARTHROPLASTY RIGHT FOOT WITH TISSUE TRANSER EXCISION OF SESAMOID BONE;  Surgeon: [name], DPM;  Location[facility];  Service: Podiatry;  Laterality: Right;  . WRIST FUSION  2002   Past Medical History:  Diagnosis Date  . Anxiety   . Chronic back pain   . Degenerative disk disease   . Depression   . DVT of upper extremity (deep vein thrombosis) ([place])   . GERD (gastroesophageal reflux disease)   . Hypertension   . MRSA (methicillin resistant staph aureus) culture positive   . Peripheral neuropathy   . Pilonidal cyst   . Pulmonary embolus ([place])    no blood thinner 2002  . Sleep apnea    can't use cpap due to deviated nasal septumg   BP 125/84   Pulse 92   Temp (!) 97.5 F (36.4 C)   Ht 6\' 5"  (1.956 m)   Wt (!) 364 lb 12.8 oz (165.5 kg)   SpO2 94%   BMI 43.26 kg/m   Opioid Risk Score:   Fall Risk Score:  `1  Depression screen PHQ 2/9  Depression screen [facility] 2/9 03/13/2020 02/27/2020 01/23/2020 01/16/2020 08/28/2019 06/28/2019 05/15/2019  Decreased Interest 1 1 1 3  0 0 1  Down, Depressed, Hopeless 1 0 1 0 1 0 1  PHQ - 2 Score 2 1 2 3 1  0 2  Altered sleeping - 3 - 3 - - 3  Tired, decreased energy - 3 - 3 - - 2  Change in appetite - 2 - 3 - - 1  Feeling bad or failure about yourself  - 0 - 1 - - 0  Trouble concentrating - 3 - 3 - - 2  Moving slowly or fidgety/restless - 0 - 0 - - 0  Suicidal thoughts - 0 - 0 - - 0  PHQ-9 Score - 12 - 16 - - 10  Difficult doing work/chores - Very difficult - Extremely dIfficult - - Somewhat difficult  Some recent data might be hidden    Review of Systems  Constitutional: Negative.   HENT: Negative.   Eyes: Negative.   Respiratory: Positive for apnea and shortness of breath.   Cardiovascular: Positive for leg  swelling.  Gastrointestinal: Negative.   Endocrine: Negative.   Genitourinary: Negative.   Musculoskeletal: Positive for gait problem.       Spasms  Skin: Positive for rash.  Allergic/Immunologic: Negative.   Neurological: Positive for weakness and numbness.  Tingling  Hematological: Negative.   Psychiatric/Behavioral: Positive for dysphoric mood. The patient is nervous/anxious.        Objective:   Physical Exam Vitals and nursing note reviewed.  Constitutional:      Appearance: Normal appearance. He is obese.  Cardiovascular:     Rate and Rhythm: Normal rate and regular rhythm.     Pulses: Normal pulses.     Heart sounds: Normal heart sounds.  Pulmonary:     Effort: Pulmonary effort is normal.     Breath sounds: Normal breath sounds.  Musculoskeletal:     Cervical back: Normal range of motion and neck supple.     Comments: Normal Muscle Bulk and Muscle Testing Reveals:  Upper Extremities: Full ROM and Muscle Strength 5/5  Lumbar Paraspinal Tenderness: L-3-L-5 Lower Extremities: Full ROM and Muscle Strength 5/5 Arises from Table Slowly Narrow Based Gait  Skin:    General: Skin is warm and dry.  Neurological:     Mental Status: He is alert and oriented to person, place, and time.  Psychiatric:        Mood and Affect: Mood normal.        Behavior: Behavior normal.        Assessment & Plan:  1. Chronic idiopathic axonal polyneuropathy: Continue Lyrica. 01/23/2020. 2. Posterior tibial tendinitis of left lower extremity: Continue HEP as Tolerated. Continue to Monitor. 01/23/2020 3. Lumbar DDD/ Lumbar Radiculitis: Continue current medication regime and HEP as Tolerated. 01/23/2020. 4. Chronic Pain Syndrome: Increased dose of Oxycodone 7.5/325mg  to one tablet 5 times daily as needed for pain. 5. Discussed good exercise and nutrition habits.  6. Insomnia: Continue Amitriptyline as needed HS We will continue the opioid monitoring program, this consists of regular  clinic visits, examinations, urine drug screen, pill counts as well as use of [place] Controlled Substance Reporting system.   20 minutes of face to face patient care time were spent during this visit. All questions were encouraged and answered.  Follow up with me in 1 month.

## 2020-03-19 ENCOUNTER — Other Ambulatory Visit: Payer: Self-pay

## 2020-03-19 ENCOUNTER — Encounter: Payer: Self-pay | Admitting: Emergency Medicine

## 2020-03-19 ENCOUNTER — Ambulatory Visit
Admission: EM | Admit: 2020-03-19 | Discharge: 2020-03-19 | Disposition: A | Discharge status: Home / Self Care | Payer: BC Managed Care – PPO | Attending: Emergency Medicine | Admitting: Emergency Medicine

## 2020-03-19 DIAGNOSIS — R05 Cough: Secondary | ICD-10-CM

## 2020-03-19 DIAGNOSIS — R197 Diarrhea, unspecified: Secondary | ICD-10-CM | POA: Diagnosis not present

## 2020-03-19 DIAGNOSIS — Z20822 Contact with and (suspected) exposure to covid-19: Secondary | ICD-10-CM

## 2020-03-19 DIAGNOSIS — R058 Other specified cough: Secondary | ICD-10-CM

## 2020-03-19 MED ORDER — BENZONATATE 100 MG PO CAPS
100.0000 mg | ORAL_CAPSULE | Freq: Three times a day (TID) | ORAL | 0 refills | Status: DC
Start: 2020-03-19 — End: 2020-03-19

## 2020-03-19 MED ORDER — BENZONATATE 100 MG PO CAPS
100.0000 mg | ORAL_CAPSULE | Freq: Three times a day (TID) | ORAL | 0 refills | Status: DC
Start: 2020-03-19 — End: 2020-04-03

## 2020-03-19 NOTE — Discharge Instructions (Addendum)

## 2020-03-19 NOTE — ED Provider Notes (Signed)
[facility]    CSN: [id number] Arrival date & time: 03/19/20  1209      History   Chief Complaint Chief Complaint  Patient presents with  . Cough  . Diarrhea    HPI MAICHAEL HEMME is a 49 y.o. male with history of PE, sleep apnea, hypertension   Presenting for Covid testing: Exposure: Roommate Date of exposure: Chronic/cohabitate Any fever, symptoms since exposure: Yes: Dry cough, loose stools without blood or melena, myalgias x3 days.  Not take anything for symptoms.   Past Medical History:  Diagnosis Date  . Anxiety   . Chronic back pain   . Degenerative disk disease   . Depression   . DVT of upper extremity (deep vein thrombosis) ([place])   . GERD (gastroesophageal reflux disease)   . Hypertension   . MRSA (methicillin resistant staph aureus) culture positive   . Peripheral neuropathy   . Pilonidal cyst   . Pulmonary embolus ([place])    no blood thinner 2002  . Sleep apnea    can't use cpap due to deviated nasal septumg    Patient Active Problem List   Diagnosis Date Noted  . Ulcer of great toe, right, with fat layer exposed ([place])   . Surgery, elective 05/16/2019  . Acute idiopathic gout of multiple sites 03/23/2019  . Recurrent infections 03/07/2019  . Blepharitis of right upper eyelid 03/07/2019  . Body mass index 40.0-44.9, adult ([place]) 04/19/2018  . Pain in left foot 04/19/2018  . Shortness of breath 04/07/2018  . Lower extremity edema 04/07/2018  . Stasis dermatitis of both legs 04/07/2018  . Hypokalemia 04/07/2018  . Healthcare maintenance 04/07/2018  . Great toe pain, left 03/08/2018  . Acute gout involving toe of left foot 03/08/2018  . Low libido 02/09/2018  . OSA on CPAP 02/03/2018  . Status post nasal septoplasty 02/03/2018  . Chronic pansinusitis 12/07/2017  . Hypertrophy, nasal, turbinate 12/07/2017  . Deviated septum 11/11/2017  . Umbilical hernia without obstruction and without gangrene 11/11/2017  . Scrotal pain  11/11/2017  . Scrotal cyst 11/11/2017  . Screening, lipid 11/11/2017  . Screening for diabetes mellitus (DM) 11/11/2017  . Screening for thyroid disorder 11/11/2017  . Anxiety and depression 08/11/2017  . Acute maxillary sinusitis 08/11/2017  . 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 Lumbar degenerative disc disease 08/02/2017  . Acute left ankle pain 07/12/2017  . Skin infection 06/03/2017  . Chronic folliculitis [id number]  . [place] 04/10/2017  . Cellulitis and abscess of trunk s/p I&D 04/10/2017 04/06/2017  . History of MRSA infection 04/06/2017  . Physical exam, annual 03/03/2017  . Neuropathy 09/28/2015  . Major depression 12/26/2012  . Generalized anxiety disorder 12/26/2012  . Opiate dependence ([place]) 12/24/2012  . Benzodiazepine dependence ([place]) 12/24/2012  . HTN (hypertension) 12/24/2012  . Pilonidal cyst 11/22/2012  . Morbid obesity ([place]) 09/09/2010  . TOBACCO ABUSE 09/09/2010  . LOW BACK PAIN, CHRONIC 09/09/2010    Past Surgical History:  Procedure Laterality Date  . Irrigation and debridement of back abscess    . MASS EXCISION Right 06/14/2019   Procedure: EXCISION BENIGN LESION RIGHT FOOT;  Surgeon: [name], DPM;  Location[facility];  Service: Podiatry;  Laterality: Right;  . NASAL SEPTOPLASTY W/ TURBINOPLASTY [name] 02/03/2018   Procedure: NASAL SEPTOPLASTY WITH TURBINATE REDUCTION;  Surgeon: [name], MD;  Location: [facility];  Service: ENT;  Laterality: [name];  . PILONIDAL CYST DRAINAGE [name] 04/10/2017   Procedure: IRRIGATION AND DEBRIDEMENT BACK ABSCESS;  Surgeon: [name], MD;  [facility];  Service: General;  Laterality: [name];  . SINUS ENDO WITH FUSION [name] 02/03/2018   Procedure: ENDOSCOPIC SINUS SURGERY WITH FUSION;  Surgeon: [name], MD;  Location: [facility];  Service: ENT;  Laterality: [name];  . THORACIC OUTLET SURGERY  2000  . TOE ARTHROPLASTY Right 06/14/2019   Procedure: HALLUX ARTHROPLASTY RIGHT FOOT WITH TISSUE TRANSER EXCISION OF  SESAMOID BONE;  Surgeon: [name], DPM;  Location[facility];  Service: Podiatry;  Laterality: Right;  . WRIST FUSION  2002       Home Medications    Prior to Admission medications   Medication Sig Start Date End Date Taking? Authorizing Provider  Acetaminophen (TYLENOL ARTHRITIS PAIN PO) Take 600 mg by mouth 3 (three) times daily.    [provider]  atorvastatin (LIPITOR) 20 MG tablet Take 1 tablet (20 mg total) by mouth daily. 02/20/20 05/20/20  Donato Heinz, MD  benzonatate (TESSALON) 100 MG capsule Take 1 capsule (100 mg total) by mouth every 8 (eight) hours. 03/19/20   Hall-Potvin, [place], PA-C  escitalopram (LEXAPRO) 5 MG tablet Take 1 tablet (5 mg total) by mouth daily. 12/27/19   [name], Clide Deutscher, MD  fluticasone (FLONASE) 50 MCG/ACT nasal spray 1 spray each nostril twice daily after sinus rinses 01/16/20   Opalski, Deborah, DO  montelukast (SINGULAIR) 10 MG tablet Take 1 tablet (10 mg total) by mouth at bedtime. 01/16/20   Opalski, Neoma Laming, DO  Nutritional Supplements (JUICE PLUS FIBRE PO) Take 4 each by mouth daily with lunch.    [provider]  oxyCODONE-acetaminophen (PERCOCET) 7.5-325 MG tablet Take 1 tablet by mouth 5 (five) times daily as needed. 03/13/20   [name], Clide Deutscher, MD  pregabalin (LYRICA) 300 MG capsule Take 1 capsule (300 mg total) by mouth 2 (two) times daily. 03/13/20   [name], Clide Deutscher, MD  sulfamethoxazole-trimethoprim (BACTRIM DS) 800-160 MG tablet Take 1 tablet by mouth 2 (two) times daily.    [provider]  varenicline (CHANTIX STARTING MONTH PAK) 0.5 MG X 11 & 1 MG X 42 tablet Take one 0.5mg  tablet by mouth once daily for 3 days, then increase to one 0.5mg  tablet twice daily for 3 days, then increase to one 1mg  tablet twice daily. 01/16/20   Opalski, Neoma Laming, DO  varenicline (CHANTIX) 1 MG tablet Take 1 tablet (1 mg total) by mouth 2 (two) times daily. 01/16/20   Mellody Dance, DO    Family History Family History   Problem Relation Age of Onset  . Cancer Maternal Grandmother        breast, ovarian & colon  . Hypertension Mother   . Diabetes Mother   . Hypertension Father   . Heart disease Father   . Stroke Paternal Grandfather   . Neuropathy Neg Hx     Social History Social History   Tobacco Use  . Smoking status: Current Every Day Smoker    Packs/day: 0.25    Years: 33.00    Pack years: 8.25    Types: Cigarettes  . Smokeless tobacco: Never Used  . Tobacco comment: 06/28/19 1/4 pack per day  Substance Use Topics  . Alcohol use: No    Alcohol/week: 0.0 standard drinks    Comment: Rarely  . Drug use: No     Allergies   Pollen extract, Dust mite extract, and Other   Review of Systems As per HPI   Physical Exam Triage Vital Signs ED Triage Vitals  Enc  Vitals Group     BP      Pulse      Resp      Temp      Temp src      SpO2      Weight      Height      Head Circumference      Peak Flow      Pain Score      Pain Loc      Pain Edu?      Excl. in [place]?    No data found.  Updated Vital Signs BP 139/83 (BP Location: Left Arm)   Pulse 80   Temp 98 F (36.7 C) (Oral)   Resp 18   SpO2 94%   Visual Acuity Right Eye Distance:   Left Eye Distance:   Bilateral Distance:    Right Eye Near:   Left Eye Near:    Bilateral Near:     Physical Exam Constitutional:      General: He is not in acute distress. HENT:     Head: Normocephalic and atraumatic.  Eyes:     General: No scleral icterus.    Pupils: Pupils are equal, round, and reactive to light.  Cardiovascular:     Rate and Rhythm: Normal rate.  Pulmonary:     Effort: Pulmonary effort is normal. No respiratory distress.     Breath sounds: No wheezing.  Skin:    Coloration: Skin is not jaundiced or pale.  Neurological:     Mental Status: He is alert and oriented to person, place, and time.      UC Treatments / Results  Labs (all labs ordered are listed, but only abnormal results are displayed) Labs  Reviewed  NOVEL CORONAVIRUS, NAA    EKG   Radiology No results found.  Procedures Procedures (including critical care time)  Medications Ordered in UC Medications - No data to display  Initial Impression / Assessment and Plan / UC Course  I have reviewed the triage vital signs and the nursing notes.  Pertinent labs & imaging results that were available during my care of the patient were reviewed by me and considered in my medical decision making (see chart for details).     Patient afebrile, nontoxic, with SpO2 94%.  Covid PCR pending.  Patient to quarantine until results are back.  We will treat supportively as outlined below.  Return precautions discussed, patient verbalized understanding and is agreeable to plan. Final Clinical Impressions(s) / UC Diagnoses   Final diagnoses:  Cough with exposure to COVID-19 virus  Diarrhea, unspecified type     Discharge Instructions     Tessalon for cough. Start flonase, atrovent nasal spray for nasal congestion/drainage. You can use over the counter nasal saline rinse such as neti pot for nasal congestion. Keep hydrated, your urine should be clear to pale yellow in color. Tylenol/motrin for fever and pain. Monitor for any worsening of symptoms, chest pain, shortness of breath, wheezing, swelling of the throat, go to the emergency department for further evaluation needed.     ED Prescriptions    Medication Sig Dispense Auth. Provider   benzonatate (TESSALON) 100 MG capsule  (Status: Discontinued) Take 1 capsule (100 mg total) by mouth every 8 (eight) hours. 21 capsule Hall-Potvin, [place], PA-C   benzonatate (TESSALON) 100 MG capsule Take 1 capsule (100 mg total) by mouth every 8 (eight) hours. 21 capsule Hall-Potvin, [place], PA-C     PDMP not reviewed this encounter.  Hall-Potvin, [place], [place] 03/19/20 1437

## 2020-03-19 NOTE — ED Triage Notes (Signed)
Pt here for cough, diarrhea and body aches x 3 days; pt requesting covid testing

## 2020-03-20 LAB — NOVEL CORONAVIRUS, NAA: SARS-CoV-2, NAA: NOT DETECTED

## 2020-03-20 LAB — SARS-COV-2, NAA 2 DAY TAT

## 2020-04-03 ENCOUNTER — Ambulatory Visit (INDEPENDENT_AMBULATORY_CARE_PROVIDER_SITE_OTHER): Payer: BC Managed Care – PPO | Admitting: Cardiology

## 2020-04-03 ENCOUNTER — Other Ambulatory Visit: Payer: Self-pay

## 2020-04-03 VITALS — BP 118/72 | HR 84 | Ht 77.0 in | Wt 367.6 lb

## 2020-04-03 DIAGNOSIS — Z720 Tobacco use: Secondary | ICD-10-CM

## 2020-04-03 DIAGNOSIS — M79604 Pain in right leg: Secondary | ICD-10-CM

## 2020-04-03 DIAGNOSIS — M79605 Pain in left leg: Secondary | ICD-10-CM

## 2020-04-03 DIAGNOSIS — R079 Chest pain, unspecified: Secondary | ICD-10-CM

## 2020-04-03 DIAGNOSIS — I10 Essential (primary) hypertension: Secondary | ICD-10-CM

## 2020-04-03 DIAGNOSIS — R0602 Shortness of breath: Secondary | ICD-10-CM | POA: Diagnosis not present

## 2020-04-03 DIAGNOSIS — R600 Localized edema: Secondary | ICD-10-CM | POA: Diagnosis not present

## 2020-04-03 DIAGNOSIS — I2510 Atherosclerotic heart disease of native coronary artery without angina pectoris: Secondary | ICD-10-CM

## 2020-04-03 MED ORDER — ATORVASTATIN CALCIUM 10 MG PO TABS
10.0000 mg | ORAL_TABLET | Freq: Every day | ORAL | 3 refills | Status: DC
Start: 2020-04-03 — End: 2021-11-03

## 2020-04-03 NOTE — Patient Instructions (Signed)
Medication Instructions:  DECREASE atorvastatin (Lipitor) to 10 mg daily  *If you need a refill on your cardiac medications before your next appointment, please call your pharmacy*   Lab Work: CMET  If you have labs (blood work) drawn today and your tests are completely normal, you will receive your results only by: [name] MyChart Message (if you have MyChart) OR . A paper copy in the mail If you have any lab test that is abnormal or we need to change your treatment, we will call you to review the results.   Testing/Procedures: Your physician has requested that you have an ankle brachial index (ABI). During this test an ultrasound and blood pressure cuff are used to evaluate the arteries that supply the arms and legs with blood. Allow thirty minutes for this exam. There are no restrictions or special instructions.  Follow-Up: At [facility], you and your health needs are our priority.  As part of our continuing mission to provide you with exceptional heart care, we have created designated Provider Care Teams.  These Care Teams include your primary Cardiologist (physician) and Advanced Practice Providers (APPs -  Physician Assistants and Nurse Practitioners) who all work together to provide you with the care you need, when you need it.  We recommend signing up for the patient portal called "MyChart".  Sign up information is provided on this After Visit Summary.  MyChart is used to connect with patients for Virtual Visits (Telemedicine).  Patients are able to view lab/test results, encounter notes, upcoming appointments, etc.  Non-urgent messages can be sent to your provider as well.   To learn more about what you can do with MyChart, go to [email or web address].    Your next appointment:   3 month(s)  The format for your next appointment:   In Person  Provider:   Oswaldo Milian, MD   Other Instructions You have been referred to : careguide for smoking cessation

## 2020-04-03 NOTE — Progress Notes (Signed)
Cardiology Office Note:    Date:  04/03/2020   ID:  Eduardo [name], DOB [birth year], MRN [id number]  PCP:  Lorrene Reid, PA-C  Cardiologist:  Buford Dresser, MD  Electrophysiologist:  None   Referring MD: [name], NP   Chief Complaint  Patient presents with  . Leg Pain    History of Present Illness:    Eduardo [name] is a 49 y.o. male with a hx of chronic pain, hypertension, anxiety/depression, tobacco use, upper extremity DVT, GERD, PE, OSA who presents for follow-up.  He was referred by Dr. [name] for evaluation of lower extremity edema and orthopnea, initially seen on 01/01/20. He was seen by Dr.[name] with the symptoms on 3/10.  Amlodipine use was suspected as the cause of his lower extremity edema and was discontinued.  He was previously seen by cardiology, Dr. [name], on 06/02/2018.  He had been referred for shortness of breath.  TTE was done on 06/13/2018, which showed no abnormalities.  He reports that he has been having lower extremity edema for about 1 year.  Recently stopped amlodipine and does report some improvement.  Has not been monitoring his blood pressure since he stopped amlodipine.  Also reports dyspnea, particularly when lying flat.  Reports intermittent chest tightness with exertion.  States only occurs with significant exertion, lasts less than a minute and resolves with rest.  Reports intermittent lightheadedness.  Denies any syncope or palpitations. Reports weight has been stable.  Smokes 1 pack/day, has smoked for 40 years.  No history of heart disease in his immediate family.  Coronary CTA showed nonobstructive CAD with calcified plaque causing mild (25 to 49%) stenosis in the mid LAD.  TTE on 01/17/2020 showed LVEF 70 to 00%, grade 1 diastolic dysfunction.  Since last clinic visit, he reports his main issue has been leg pain.  States that can occur at rest or with exertion.  He continues to smoke 1 pack/day.  States that his chest  pain has resolved.  Still feels short of breath and having lower extremity edema.  States that he is able to sleep lying flat.   Past Medical History:  Diagnosis Date  . Anxiety   . Chronic back pain   . Degenerative disk disease   . Depression   . DVT of upper extremity (deep vein thrombosis) ([place])   . GERD (gastroesophageal reflux disease)   . Hypertension   . MRSA (methicillin resistant staph aureus) culture positive   . Peripheral neuropathy   . Pilonidal cyst   . Pulmonary embolus ([place])    no blood thinner 2002  . Sleep apnea    can't use cpap due to deviated nasal septumg    Past Surgical History:  Procedure Laterality Date  . Irrigation and debridement of back abscess    . MASS EXCISION Right 06/14/2019   Procedure: EXCISION BENIGN LESION RIGHT FOOT;  Surgeon: [name], DPM;  Location: [facility];  Service: Podiatry;  Laterality: Right;  . NASAL SEPTOPLASTY W/ TURBINOPLASTY [name] 02/03/2018   Procedure: NASAL SEPTOPLASTY WITH TURBINATE REDUCTION;  Surgeon: [name], MD;  Location: [facility];  Service: ENT;  Laterality: [name];  . PILONIDAL CYST DRAINAGE [name] 04/10/2017   Procedure: IRRIGATION AND DEBRIDEMENT BACK ABSCESS;  Surgeon: [name], MD;  Location: [facility];  Service: General;  Laterality: [name];  . SINUS ENDO WITH FUSION [name] 02/03/2018   Procedure: ENDOSCOPIC SINUS SURGERY WITH FUSION;  Surgeon: [name], MD;  Location: [facility];  Service: ENT;  Laterality: [name];  . THORACIC OUTLET SURGERY  2000  . TOE ARTHROPLASTY Right 06/14/2019   Procedure: HALLUX ARTHROPLASTY RIGHT FOOT WITH TISSUE TRANSER EXCISION OF SESAMOID BONE;  Surgeon: [name], DPM;  Location[facility];  Service: Podiatry;  Laterality: Right;  . WRIST FUSION  2002    Current Medications: No outpatient medications have been marked as taking for the 04/03/20 encounter (Office Visit) with Donato Heinz, MD.     Allergies:   Pollen extract, Dust mite extract, and Other   Social History    Socioeconomic History  . Marital status: Divorced    Spouse name: Not on file  . Number of children: 2  . Years of education: 12th   . Highest education level: Not on file  Occupational History  . Occupation: [name]  Tobacco Use  . Smoking status: Current Every Day Smoker    Packs/day: 0.25    Years: 33.00    Pack years: 8.25    Types: Cigarettes  . Smokeless tobacco: Never Used  . Tobacco comment: 06/28/19 1/4 pack per day  Vaping Use  . Vaping Use: Former  Substance and Sexual Activity  . Alcohol use: No    Alcohol/week: 0.0 standard drinks    Comment: Rarely  . Drug use: No  . Sexual activity: Yes    Birth control/protection: None  Other Topics Concern  . Not on file  Social History Narrative   Lives at home w/ his step-mother   Right-handed   Drinks about 2-3 [place] a day   Social Determinants of Health   Financial Resource Strain:   . Difficulty of Paying Living Expenses:   Food Insecurity:   . Worried About Charity fundraiser in the Last Year:   . Arboriculturist in the Last Year:   Transportation Needs:   . Film/video editor (Medical):   [name] Lack of Transportation (Non-Medical):   Physical Activity:   . Days of Exercise per Week:   . Minutes of Exercise per Session:   Stress:   . Feeling of Stress :   Social Connections:   . Frequency of Communication with Friends and Family:   . Frequency of Social Gatherings with Friends and Family:   . Attends Religious Services:   . Active Member of Clubs or Organizations:   . Attends Archivist Meetings:   [name] Marital Status:      Family History: The patient's family history includes Cancer in his maternal grandmother; Diabetes in his mother; Heart disease in his father; Hypertension in his father and mother; Stroke in his paternal grandfather. There is no history of Neuropathy.  ROS:   Please see the history of present illness.     All other systems reviewed and are negative.  EKGs/Labs/Other  Studies Reviewed:    The following studies were reviewed today:   EKG:  EKG is ordered today.  The ekg ordered today demonstrates normal sinus rhythm, rate 75, no ST/T abnormalities  TTE 06/13/18: - Left ventricle: The cavity size was normal. There was mild  concentric hypertrophy. Systolic function was normal. The  estimated ejection fraction was in the range of 60% to 65%. Wall  motion was normal; there were no regional wall motion  abnormalities. Left ventricular diastolic function parameters  were normal.  - Aortic valve: Transvalvular velocity was within the normal range.  There was no stenosis. There was no regurgitation.  - Mitral valve: Transvalvular velocity was within the normal range.  There was no evidence for stenosis. There was trivial  regurgitation.  - Right ventricle: The cavity size was normal. Wall thickness was  normal. Systolic function was normal.  - Atrial septum: No defect or patent foramen ovale was identified  by color flow Doppler.  - Tricuspid valve: There was trivial regurgitation.  - Pulmonary arteries: Systolic pressure was within the normal  range. PA peak pressure: 16 mm Hg (S).  - Global longitudinal strain -21.6% (normal).   Recent Labs: 02/23/2020: ALT 14; BUN 20; Creatinine, Ser 1.34; Hemoglobin 16.0; Platelets 181; Potassium 4.5; Sodium 136; TSH 1.680  Recent Lipid Panel    Component Value Date/Time   CHOL 92 (L) 02/23/2020 0844   TRIG 299 (H) 02/23/2020 0844   HDL 19 (L) 02/23/2020 0844   CHOLHDL 4.8 02/23/2020 0844   CHOLHDL 4.5 10/25/2014 1124   VLDL 36 10/25/2014 1124   LDLCALC 28 02/23/2020 0844    Physical Exam:    VS:  BP 118/72   Pulse 84   Ht 6\' 5"  (1.956 m)   Wt (!) 367 lb 9.6 oz (166.7 kg)   SpO2 96%   BMI 43.59 kg/m     Wt Readings from Last 3 Encounters:  04/03/20 (!) 367 lb 9.6 oz (166.7 kg)  03/13/20 (!) 364 lb 12.8 oz (165.5 kg)  02/27/20 (!) 358 lb (162.4 kg)     GEN: in no acute  distress HEENT: Normal NECK: No JVD CARDIAC: RRR, no murmurs, rubs, gallops RESPIRATORY:  Clear to auscultation without rales, wheezing or rhonchi  ABDOMEN: Soft, non-tender, non-distended MUSCULOSKELETAL:  1+ edema; SKIN: Warm and dry NEUROLOGIC:  Alert and oriented x 3 PSYCHIATRIC:  Normal affect   ASSESSMENT:    1. Lower extremity edema   2. Essential hypertension   3. Shortness of breath   4. Tobacco use   5. Chest pain, unspecified type   6. Coronary artery disease involving native coronary artery of native heart without angina pectoris    PLAN:    CAD: Coronary CTA showed nonobstructive CAD with calcified plaque causing mild (25 to 49%) stenosis in the mid LAD.  Was reporting chest pain previously, but states symptoms have resolved -LDL down to 28 on atorvastatin 20 mg daily.  Will decrease atorvastatin dose to 10 mg daily  Lower extremity edema/dyspnea: Normal systolic function on echo, but diastolic dysfunction could be contributing.  Discussed that we could start trial of Lasix to see if helps with symptoms.  He was hesitant to start Lasix as he is worried about his kidney function.  Will check CMP, BNP and consider trial of lasix if stable renal function  Hypertension: Recently stopped his amlodipine due to edema.  Currently appears normotensive off antihypertensives.  We will continue to monitor.  OSA: Has not been able to tolerate CPAP.  Compliance with CPAP strongly encouraged  Tobacco use: smokes 1ppd.  Cessation strongly encouraged.  Will refer to smoking cessation program  Leg pain: We will check ABIs  RTC in 3 months   Medication Adjustments/Labs and Tests Ordered: Current medicines are reviewed at length with the patient today.  Concerns regarding medicines are outlined above.  Orders Placed This Encounter  Procedures  . Comprehensive metabolic panel  . B Nat Peptide  . VAS [place] ABI WITH/WO TBI  . VAS [place] LOWER EXTREMITY ARTERIAL DUPLEX   Meds ordered  this encounter  Medications  . atorvastatin (LIPITOR) 10 MG tablet    Sig: Take 1 tablet (10 mg total) by mouth  daily.    Dispense:  90 tablet    Refill:  3    Patient Instructions  Medication Instructions:  DECREASE atorvastatin (Lipitor) to 10 mg daily  *If you need a refill on your cardiac medications before your next appointment, please call your pharmacy*   Lab Work: CMET  If you have labs (blood work) drawn today and your tests are completely normal, you will receive your results only by: [name] MyChart Message (if you have MyChart) OR . A paper copy in the mail If you have any lab test that is abnormal or we need to change your treatment, we will call you to review the results.   Testing/Procedures: Your physician has requested that you have an ankle brachial index (ABI). During this test an ultrasound and blood pressure cuff are used to evaluate the arteries that supply the arms and legs with blood. Allow thirty minutes for this exam. There are no restrictions or special instructions.  Follow-Up: At [facility], you and your health needs are our priority.  As part of our continuing mission to provide you with exceptional heart care, we have created designated Provider Care Teams.  These Care Teams include your primary Cardiologist (physician) and Advanced Practice Providers (APPs -  Physician Assistants and Nurse Practitioners) who all work together to provide you with the care you need, when you need it.  We recommend signing up for the patient portal called "MyChart".  Sign up information is provided on this After Visit Summary.  MyChart is used to connect with patients for Virtual Visits (Telemedicine).  Patients are able to view lab/test results, encounter notes, upcoming appointments, etc.  Non-urgent messages can be sent to your provider as well.   To learn more about what you can do with MyChart, go to [email or web address].    Your next appointment:   3  month(s)  The format for your next appointment:   In Person  Provider:   Oswaldo Milian, MD   Other Instructions You have been referred to : careguide for smoking cessation      Signed, Donato Heinz, MD  04/03/2020 11:09 PM    [place]

## 2020-04-04 ENCOUNTER — Telehealth: Payer: Self-pay | Admitting: Cardiology

## 2020-04-04 NOTE — Telephone Encounter (Signed)
Left message for patient to call and schedule ABI"s and 3 month follow up appointment with Dr. Gardiner Rhyme.

## 2020-04-05 ENCOUNTER — Encounter: Payer: Self-pay | Admitting: Physical Medicine and Rehabilitation

## 2020-04-05 ENCOUNTER — Other Ambulatory Visit: Payer: Self-pay

## 2020-04-05 ENCOUNTER — Encounter
Payer: BC Managed Care – PPO | Attending: Physical Medicine & Rehabilitation | Admitting: Physical Medicine and Rehabilitation

## 2020-04-05 VITALS — BP 153/99 | HR 84 | Temp 97.9°F | Ht 77.0 in | Wt 367.0 lb

## 2020-04-05 DIAGNOSIS — M25572 Pain in left ankle and joints of left foot: Secondary | ICD-10-CM

## 2020-04-05 DIAGNOSIS — G609 Hereditary and idiopathic neuropathy, unspecified: Secondary | ICD-10-CM | POA: Insufficient documentation

## 2020-04-05 DIAGNOSIS — M21612 Bunion of left foot: Secondary | ICD-10-CM | POA: Diagnosis not present

## 2020-04-05 DIAGNOSIS — Z79891 Long term (current) use of opiate analgesic: Secondary | ICD-10-CM | POA: Diagnosis present

## 2020-04-05 DIAGNOSIS — G894 Chronic pain syndrome: Secondary | ICD-10-CM

## 2020-04-05 DIAGNOSIS — E538 Deficiency of other specified B group vitamins: Secondary | ICD-10-CM | POA: Insufficient documentation

## 2020-04-05 DIAGNOSIS — M21611 Bunion of right foot: Secondary | ICD-10-CM | POA: Insufficient documentation

## 2020-04-05 DIAGNOSIS — R600 Localized edema: Secondary | ICD-10-CM | POA: Diagnosis present

## 2020-04-05 DIAGNOSIS — Z6841 Body Mass Index (BMI) 40.0 and over, adult: Secondary | ICD-10-CM

## 2020-04-05 DIAGNOSIS — M79671 Pain in right foot: Secondary | ICD-10-CM | POA: Diagnosis not present

## 2020-04-05 DIAGNOSIS — G8929 Other chronic pain: Secondary | ICD-10-CM | POA: Diagnosis present

## 2020-04-05 DIAGNOSIS — G608 Other hereditary and idiopathic neuropathies: Secondary | ICD-10-CM | POA: Insufficient documentation

## 2020-04-05 DIAGNOSIS — M79672 Pain in left foot: Secondary | ICD-10-CM | POA: Insufficient documentation

## 2020-04-05 DIAGNOSIS — M25562 Pain in left knee: Secondary | ICD-10-CM | POA: Diagnosis present

## 2020-04-05 DIAGNOSIS — M51369 Other intervertebral disc degeneration, lumbar region without mention of lumbar back pain or lower extremity pain: Secondary | ICD-10-CM

## 2020-04-05 DIAGNOSIS — R0601 Orthopnea: Secondary | ICD-10-CM | POA: Diagnosis present

## 2020-04-05 DIAGNOSIS — Z79899 Other long term (current) drug therapy: Secondary | ICD-10-CM | POA: Insufficient documentation

## 2020-04-05 DIAGNOSIS — M5136 Other intervertebral disc degeneration, lumbar region: Secondary | ICD-10-CM | POA: Diagnosis not present

## 2020-04-05 DIAGNOSIS — Z5181 Encounter for therapeutic drug level monitoring: Secondary | ICD-10-CM | POA: Diagnosis present

## 2020-04-05 DIAGNOSIS — M25571 Pain in right ankle and joints of right foot: Secondary | ICD-10-CM

## 2020-04-05 MED ORDER — AMITRIPTYLINE HCL 10 MG PO TABS: 10.0000 mg | ORAL_TABLET | Freq: Every day | ORAL | 3 refills | Status: DC

## 2020-04-05 MED ORDER — SULFAMETHOXAZOLE-TRIMETHOPRIM 800-160 MG PO TABS
1.0000 | ORAL_TABLET | Freq: Two times a day (BID) | ORAL | 3 refills | Status: DC
Start: 2020-04-05 — End: 2020-06-05

## 2020-04-05 MED ORDER — OXYCODONE-ACETAMINOPHEN 7.5-325 MG PO TABS: 1.0000 | ORAL_TABLET | Freq: Every day | ORAL | 0 refills | Status: DC | PRN

## 2020-04-05 NOTE — Progress Notes (Signed)
Subjective:    Patient ID: Eduardo [name], male    DOB: Feb 03, 1971, 49 y.o.   MRN: [id number]  HPI  Eduardo [name] continues to have severe pain, worst in his bilateral ankles and knees. Pain is 8/10- constant, sharp, burning, dull, stabbing, tingling, and aching. He has had better relief with increase frequency of his percocet to 5 tablets per day.  His workday has recently been increased to 10 hrs per day and he is mostly on his feet during this time.   He has been breaking th amitriptyline into three pieces and taking one of these as needed on Friday night. He cannot take before work as this would make him too sleepy.  He has also noticed worsening edema of his lower extremities. Does not want to decrease his lyrica dose.  He has a pilonidal cyst which causes him pain as well. He was taking Bactrim which helped with this pain but which has run out. He asks whether I may refill this.  Pain Inventory Average Pain 8 Pain Right Now 6 My pain is constant, sharp, burning, dull, stabbing, tingling and aching  In the last 24 hours, has pain interfered with the following? General activity 8 Relation with others 10 Enjoyment of life 10 What TIME of day is your pain at its worst? all Sleep (in general) Poor  Pain is worse with: walking, bending, sitting and standing Pain improves with: rest and medication Relief from Meds: 8  Mobility walk without assistance how many minutes can you walk? 5 ability to climb steps?  yes do you drive?  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  no  Physicians involved in your care Any changes since last visit?  no   Family History  Problem Relation Age of Onset  . Cancer Maternal Grandmother        breast, ovarian & colon  . Hypertension Mother   . Diabetes Mother   . Hypertension  Father   . Heart disease Father   . Stroke Paternal Grandfather   . Neuropathy Neg Hx    Social History   Socioeconomic History  . Marital status: Divorced    Spouse name: Not on file  . Number of children: 2  . Years of education: 12th   . Highest education level: Not on file  Occupational History  . Occupation: [name]  Tobacco Use  . Smoking status: Current Every Day Smoker    Packs/day: 0.25    Years: 33.00    Pack years: 8.25    Types: Cigarettes  . Smokeless tobacco: Never Used  . Tobacco comment: 06/28/19 1/4 pack per day  Vaping Use  . Vaping Use: Former  Substance and Sexual Activity  . Alcohol use: No    Alcohol/week: 0.0 standard drinks    Comment: Rarely  . Drug use: No  . Sexual activity: Yes    Birth control/protection: None  Other Topics Concern  . Not on file  Social History Narrative   Lives at home w/ his step-mother   Right-handed   Drinks about 2-3 [place] a day   Social Determinants of Health   Financial Resource Strain:   . Difficulty of Paying Living Expenses:   Food Insecurity:   . Worried About Charity fundraiser in the Last Year:   . [place] in the Last  Year:   Transportation Needs:   . Film/video editor (Medical):   [name] Lack of Transportation (Non-Medical):   Physical Activity:   . Days of Exercise per Week:   . Minutes of Exercise per Session:   Stress:   . Feeling of Stress :   Social Connections:   . Frequency of Communication with Friends and Family:   . Frequency of Social Gatherings with Friends and Family:   . Attends Religious Services:   . Active Member of Clubs or Organizations:   . Attends Archivist Meetings:   [name] Marital Status:    Past Surgical History:  Procedure Laterality Date  . Irrigation and debridement of back abscess    . MASS EXCISION Right 06/14/2019   Procedure: EXCISION BENIGN LESION RIGHT FOOT;  Surgeon: [name], DPM;  Location: [facility];  Service: Podiatry;  Laterality: Right;   . NASAL SEPTOPLASTY W/ TURBINOPLASTY [name] 02/03/2018   Procedure: NASAL SEPTOPLASTY WITH TURBINATE REDUCTION;  Surgeon: [name], MD;  Location: [facility];  Service: ENT;  Laterality: [name];  . PILONIDAL CYST DRAINAGE [name] 04/10/2017   Procedure: IRRIGATION AND DEBRIDEMENT BACK ABSCESS;  Surgeon: [name], MD;  Location[facility];  Service: General;  Laterality: [name];  . SINUS ENDO WITH FUSION [name] 02/03/2018   Procedure: ENDOSCOPIC SINUS SURGERY WITH FUSION;  Surgeon: [name], MD;  Location: [facility];  Service: ENT;  Laterality: [name];  . THORACIC OUTLET SURGERY  2000  . TOE ARTHROPLASTY Right 06/14/2019   Procedure: HALLUX ARTHROPLASTY RIGHT FOOT WITH TISSUE TRANSER EXCISION OF SESAMOID BONE;  Surgeon: [name], DPM;  [facility];  Service: Podiatry;  Laterality: Right;  . WRIST FUSION  2002   Past Medical History:  Diagnosis Date  . Anxiety   . Chronic back pain   . Degenerative disk disease   . Depression   . DVT of upper extremity (deep vein thrombosis) ([place])   . GERD (gastroesophageal reflux disease)   . Hypertension   . MRSA (methicillin resistant staph aureus) culture positive   . Peripheral neuropathy   . Pilonidal cyst   . Pulmonary embolus ([place])    no blood thinner 2002  . Sleep apnea    can't use cpap due to deviated nasal septumg   BP (!) 153/99   Pulse 84   Temp 97.9 F (36.6 C)   Ht 6\' 5"  (1.956 m)   Wt (!) 367 lb (166.5 kg)   SpO2 96%   BMI 43.52 kg/m   Opioid Risk Score:   Fall Risk Score:  `1  Depression screen PHQ 2/9  Depression screen [facility] 2/9 03/13/2020 02/27/2020 01/23/2020 01/16/2020 08/28/2019 06/28/2019 05/15/2019  Decreased Interest 1 1 1 3  0 0 1  Down, Depressed, Hopeless 1 0 1 0 1 0 1  PHQ - 2 Score 2 1 2 3 1  0 2  Altered sleeping - 3 - 3 - - 3  Tired, decreased energy - 3 - 3 - - 2  Change in appetite - 2 - 3 - - 1  Feeling bad or failure about yourself  - 0 - 1 - - 0  Trouble concentrating - 3 - 3 - - 2  Moving slowly or fidgety/restless  - 0 - 0 - - 0  Suicidal thoughts - 0 - 0 - - 0  PHQ-9 Score - 12 - 16 - - 10  Difficult doing work/chores - Very difficult - Extremely dIfficult - - Somewhat difficult  Some recent  data might be hidden    Review of Systems  Constitutional: Positive for unexpected weight change.  HENT: Negative.   Eyes: Negative.   Respiratory: Negative.   Cardiovascular: Positive for leg swelling.  Gastrointestinal: Negative.   Endocrine: Negative.   Genitourinary: Positive for difficulty urinating.  Musculoskeletal: Positive for gait problem.  Skin: Negative.   Allergic/Immunologic: Negative.   Neurological: Positive for weakness and numbness.       Tingling  Hematological: Negative.   Psychiatric/Behavioral: Positive for confusion and dysphoric mood. The patient is nervous/anxious.   All other systems reviewed and are negative.      Objective:   Physical Exam Gen: no distress, normal appearing HEENT: oral mucosa pink and moist, NCAT Cardio: Reg rate Chest: normal effort, normal rate of breathing. Bilateral lower extremity edema Abd: soft, non-distended, obese Ext: no edema Skin: intact Neuro: Alert and oriented x3.  Musculoskeletal: TTP from L3 to L5. 5/5 muscle strength throughout.  Psych: pleasant, normal affect    Assessment & Plan:  1.Chronic idiopathic axonal polyneuropathy: Continue Lyrica. Does not require refill at this time.  2. Posterior tibial tendinitis of left lower extremity: Continue HEP as Tolerated. Continue to Monitor.  3. Lumbar DDD/ Lumbar Radiculitis: Continue current medication regime and HEP as Tolerated.  4. Chronic Pain Syndrome: Continue Percocet 7.5/325mg  one tablet5 times daily as needed for pain. This has resulted in improved pain control, especially when he has to work 10 hour long days.   5. Obesity: Discussed good exercise and nutrition habits. Current weight is 367 lbs.   6. Insomnia: Continue Amitriptyline as needed HS. Sent 10mg  tablets so  he will not have to break his current tablets.  7. Pilonidal cyst: Reviewed ID note. Recommended ID follow-up. Co-pay is expensive for patient and is the reason he could not follow-up. He cannot pursue surgery at this time due to cost. Patient has been taking medication for 9 months and tolerating it well. Discussed side effects of long term use of antibiotics. Prescribed DS Bactrim as previously ordered by ID.   All questions were encouraged and answered.  Follow up with me in 2 months.

## 2020-04-08 NOTE — Telephone Encounter (Signed)
Left message for patient to call and schedule doppler studies ordered by Dr. Gardiner Rhyme

## 2020-04-10 ENCOUNTER — Ambulatory Visit: Payer: BC Managed Care – PPO | Admitting: Physical Medicine and Rehabilitation

## 2020-04-12 ENCOUNTER — Other Ambulatory Visit: Payer: Self-pay

## 2020-04-12 ENCOUNTER — Encounter: Payer: Self-pay | Admitting: Physician Assistant

## 2020-04-12 ENCOUNTER — Other Ambulatory Visit: Payer: BC Managed Care – PPO

## 2020-04-12 ENCOUNTER — Ambulatory Visit (INDEPENDENT_AMBULATORY_CARE_PROVIDER_SITE_OTHER): Payer: BC Managed Care – PPO | Admitting: Physician Assistant

## 2020-04-12 ENCOUNTER — Other Ambulatory Visit: Payer: Self-pay | Admitting: Cardiology

## 2020-04-12 VITALS — BP 128/93 | HR 84 | Temp 98.5°F | Ht 77.0 in | Wt 360.1 lb

## 2020-04-12 DIAGNOSIS — R600 Localized edema: Secondary | ICD-10-CM | POA: Diagnosis not present

## 2020-04-12 DIAGNOSIS — Z716 Tobacco abuse counseling: Secondary | ICD-10-CM

## 2020-04-12 DIAGNOSIS — F17200 Nicotine dependence, unspecified, uncomplicated: Secondary | ICD-10-CM

## 2020-04-12 DIAGNOSIS — E781 Pure hyperglyceridemia: Secondary | ICD-10-CM

## 2020-04-12 DIAGNOSIS — I10 Essential (primary) hypertension: Secondary | ICD-10-CM

## 2020-04-12 NOTE — Assessment & Plan Note (Signed)
-   Most recent lipid panel:

## 2020-04-12 NOTE — Progress Notes (Signed)
Established Patient Office Visit  Subjective:  Patient ID: Eduardo [name], male    DOB: Apr 06, 1971  Age: 49 y.o. MRN: [id number]  CC:  Chief Complaint  Patient presents with  . Nicotine Dependence  . Hyperlipidemia  . Hypertension    HPI SAHAND GOSCH presents for follow up on tobacco use/cessation and hyperlipidemia. Reports compliance with Chantix and currently smoking 1 PPD. He used to smoke 1 1/2 PPD. He uses smoking to cope with stress, which has made it challenging to quit. His cardiologist referred him to the smoking cessation program and thinks they have left a message and plans to return their call. He is concerned about his kidney function because he has pain near that area. Denies dysuria or hematuria.   HLD: Pt taking medication as directed without issues. States he has improved his diet which mostly consists of grilled chicken. He isn't fasting today.  HTN: Pt denies chest pain, palpitations, or dizzines. Amlodipine was discontinued due to lower extremity edema, which he continues to have. He has increased his water intake and reduced sodas. He reports poor compliance with low sodium diet and notices his swelling worsens with increased salt consumption. States he cooks with low sodium salt.   Past Medical History:  Diagnosis Date  . Anxiety   . Chronic back pain   . Degenerative disk disease   . Depression   . DVT of upper extremity (deep vein thrombosis) ([place])   . GERD (gastroesophageal reflux disease)   . Hypertension   . MRSA (methicillin resistant staph aureus) culture positive   . PAD (peripheral artery disease) ([place])   . Peripheral neuropathy   . Pilonidal cyst   . Pulmonary embolus ([place])    no blood thinner 2002  . Sleep apnea    can't use cpap due to deviated nasal septumg    Past Surgical History:  Procedure Laterality Date  . Irrigation and debridement of back abscess    . MASS EXCISION Right 06/14/2019   Procedure: EXCISION BENIGN LESION  RIGHT FOOT;  Surgeon: [name], DPM;  Location: [facility];  Service: Podiatry;  Laterality: Right;  . NASAL SEPTOPLASTY W/ TURBINOPLASTY [name] 02/03/2018   Procedure: NASAL SEPTOPLASTY WITH TURBINATE REDUCTION;  Surgeon: [name], MD;  Location: [facility];  Service: ENT;  Laterality: [name];  . PILONIDAL CYST DRAINAGE [name] 04/10/2017   Procedure: IRRIGATION AND DEBRIDEMENT BACK ABSCESS;  Surgeon: [name], MD;  Location: [facility];  Service: General;  Laterality: [name];  . SINUS ENDO WITH FUSION [name] 02/03/2018   Procedure: ENDOSCOPIC SINUS SURGERY WITH FUSION;  Surgeon: [name], MD;  Location: [facility];  Service: ENT;  Laterality: [name];  . THORACIC OUTLET SURGERY  2000  . TOE ARTHROPLASTY Right 06/14/2019   Procedure: HALLUX ARTHROPLASTY RIGHT FOOT WITH TISSUE TRANSER EXCISION OF SESAMOID BONE;  Surgeon: [name], DPM;  Location: [facility];  Service: Podiatry;  Laterality: Right;  . WRIST FUSION  2002    Family History  Problem Relation Age of Onset  . Cancer Maternal Grandmother        breast, ovarian & colon  . Hypertension Mother   . Diabetes Mother   . Hypertension Father   . Heart disease Father   . Stroke Paternal Grandfather   . Neuropathy Neg Hx     Social History   Socioeconomic History  . Marital status: Divorced    Spouse name: Not on file  . Number of children: 2  . Years of  education: 12th   . Highest education level: Not on file  Occupational History  . Occupation: [name]  Tobacco Use  . Smoking status: Current Every Day Smoker    Packs/day: 0.25    Years: 33.00    Pack years: 8.25    Types: Cigarettes  . Smokeless tobacco: Never Used  . Tobacco comment: 06/28/19 1/4 pack per day  Vaping Use  . Vaping Use: Former  Substance and Sexual Activity  . Alcohol use: No    Alcohol/week: 0.0 standard drinks    Comment: Rarely  . Drug use: No  . Sexual activity: Yes    Birth control/protection: None  Other Topics Concern  . Not on file  Social History Narrative    Lives at home w/ his step-mother   Right-handed   Drinks about 2-3 [place] a day   Social Determinants of Health   Financial Resource Strain:   . Difficulty of Paying Living Expenses:   Food Insecurity:   . Worried About Charity fundraiser in the Last Year:   . Arboriculturist in the Last Year:   Transportation Needs:   . Film/video editor (Medical):   [name] Lack of Transportation (Non-Medical):   Physical Activity:   . Days of Exercise per Week:   . Minutes of Exercise per Session:   Stress:   . Feeling of Stress :   Social Connections:   . Frequency of Communication with Friends and Family:   . Frequency of Social Gatherings with Friends and Family:   . Attends Religious Services:   . Active Member of Clubs or Organizations:   . Attends Archivist Meetings:   [name] Marital Status:   Intimate Partner Violence:   . Fear of Current or Ex-Partner:   . Emotionally Abused:   [name] Physically Abused:   . Sexually Abused:     Outpatient Medications Prior to Visit  Medication Sig Dispense Refill  . Acetaminophen (TYLENOL ARTHRITIS PAIN PO) Take 600 mg by mouth 3 (three) times daily.    [name] amitriptyline (ELAVIL) 10 MG tablet Take 1 tablet (10 mg total) by mouth at bedtime. 30 tablet 3  . atorvastatin (LIPITOR) 10 MG tablet Take 1 tablet (10 mg total) by mouth daily. 90 tablet 3  . fluticasone (FLONASE) 50 MCG/ACT nasal spray 1 spray each nostril twice daily after sinus rinses 16 g 2  . montelukast (SINGULAIR) 10 MG tablet Take 1 tablet (10 mg total) by mouth at bedtime. 90 tablet 0  . Nutritional Supplements (JUICE PLUS FIBRE PO) Take 4 each by mouth daily with lunch.    . oxyCODONE-acetaminophen (PERCOCET) 7.5-325 MG tablet Take 1 tablet by mouth 5 (five) times daily as needed. 300 tablet 0  . pregabalin (LYRICA) 300 MG capsule Take 1 capsule (300 mg total) by mouth 2 (two) times daily. 60 capsule 2  . sulfamethoxazole-trimethoprim (BACTRIM DS) 800-160 MG tablet Take 1 tablet  by mouth 2 (two) times daily. 120 tablet 3  . varenicline (CHANTIX) 1 MG tablet Take 1 tablet (1 mg total) by mouth 2 (two) times daily. 180 tablet 0   No facility-administered medications prior to visit.    Allergies  Allergen Reactions  . Pollen Extract Cough  . Dust Mite Extract Other (See Comments)  . Other Other (See Comments)    Ragweed     ROS Review of Systems Review of Systems:  A fourteen system review of systems was performed and found to be positive as  per HPI.  Objective:    Physical Exam   General: Well nourished, in no apparent distress. Eyes: PERRLA, EOMs, conjunctiva clr Resp: Respiratory effort- normal, ECTA B/L w/o W/R/R  Cardio: RRR w/o murmur Abdomen: no gross distention. Lymphatics:  LE edema noted. M-sk: Full ROM, good strength, normal gait. No CVA tenderness. Skin: Warm, dry.  Neuro: Alert, Oriented Psych: Normal affect, Insight and Judgment appropriate.   BP (!) 128/93   Pulse 84   Temp 98.5 F (36.9 C) (Oral)   Ht 6\' 5"  (1.956 m)   Wt (!) 360 lb 1.6 oz (163.3 kg)   SpO2 95% Comment: on RA  BMI 42.70 kg/m  Wt Readings from Last 3 Encounters:  04/12/20 (!) 360 lb 1.6 oz (163.3 kg)  04/05/20 (!) 367 lb (166.5 kg)  04/03/20 (!) 367 lb 9.6 oz (166.7 kg)     Health Maintenance Due  Topic Date Due  . URINE MICROALBUMIN  Never done  . COVID-19 Vaccine (1) Never done  . COLONOSCOPY  Never done    There are no preventive care reminders to display for this patient.  Lab Results  Component Value Date   TSH 1.680 02/23/2020   Lab Results  Component Value Date   WBC 8.9 02/23/2020   HGB 16.0 02/23/2020   HCT 46.1 02/23/2020   MCV 95 02/23/2020   PLT 181 02/23/2020   Lab Results  Component Value Date   NA 140 04/12/2020   K 4.3 04/12/2020   CO2 22 04/12/2020   GLUCOSE 122 (H) 04/12/2020   BUN 13 04/12/2020   CREATININE 1.13 04/12/2020   BILITOT 0.8 04/12/2020   ALKPHOS 121 04/12/2020   AST 17 04/12/2020   ALT 16 04/12/2020    PROT 7.3 04/12/2020   ALBUMIN 4.3 04/12/2020   CALCIUM 8.9 04/12/2020   ANIONGAP 9 06/12/2019   Lab Results  Component Value Date   CHOL 92 (L) 02/23/2020   Lab Results  Component Value Date   HDL 19 (L) 02/23/2020   Lab Results  Component Value Date   LDLCALC 28 02/23/2020   Lab Results  Component Value Date   TRIG 299 (H) 02/23/2020   Lab Results  Component Value Date   CHOLHDL 4.8 02/23/2020   Lab Results  Component Value Date   HGBA1C 5.0 02/23/2020      Assessment & Plan:   Problem List Items Addressed This Visit      Cardiovascular and Mediastinum   HTN (hypertension)    - BP today is 128/93, stable - Encouraged to reduce sodium and continue to stay well hydrated, at least 64 fl oz daily. - Encourage ambulatory BP and pulse monitoring few times/wk and if BP consistently >130/80 then should notify the office since he isn't on antihypertensives at this time. - Stay as active as possible.          Other   Morbid obesity ([place])    - BMI 42.70 and has lost 7 pounds since last OV. - Encouraged to continue with dietary and lifestyle changes.       TOBACCO ABUSE - Primary    - Recommended to do Smoking Cessation Program and reach out. - Discussed with patient therapy duration with Chantix and has been on medication almost 12 weeks, so recommend to set quit date and medication can be extended for 12 weeks.  - Encouraged to find alternative coping mechanisms for stress such as exercise or positive activities to help reduce smoking triggers.  - Will continue  to monitor.      Lower extremity edema    - Encouraged to follow low sodium diet and elevation. - Cardiologist ordered CMP to evaluate kidney function and if stable, will trial Lasix. Labs collected today.       Hypertriglyceridemia    - Most recent lipid panel:       Relevant Orders   Direct LDL (Completed)    Other Visit Diagnoses    Encounter for tobacco use cessation counseling          Hypertriglyceridemia: -Last lipid panel: Total cholesterol 92, triglycerides 299, HDL 19, LDL 28 -Cardiologist, Dr. Nechama Guard, reduced atorvastatin from 20 mg to 10 mg. -Patient is not fasting today so we will collect a direct LDL. -Follow heart healthy diet and reduce carbohydrates and glucose to help improve triglycerides. -Increase physical activity level such as walking for 30 minutes 3 times/wk and gradually increase with ultimate goal of 150 minutes.  Encounter for tobacco use cessation counseling: Smoking cessation instruction/counseling given:  counseled patient on the dangers of tobacco use, advised patient to stop smoking, and reviewed strategies to maximize success   No orders of the defined types were placed in this encounter.   Follow-up: Return in about 3 months (around 07/13/2020) for HTN, HLD, Tobacco abuse/cessation.    Lorrene Reid, PA-C

## 2020-04-12 NOTE — Assessment & Plan Note (Signed)
-   BMI 42.70 and has lost 7 pounds since last OV. - Encouraged to continue with dietary and lifestyle changes.

## 2020-04-12 NOTE — Assessment & Plan Note (Signed)
-   Recommended to do Smoking Cessation Program and reach out. - Discussed with patient therapy duration with Chantix and has been on medication almost 12 weeks, so recommend to set quit date and medication can be extended for 12 weeks.  - Encouraged to find alternative coping mechanisms for stress such as exercise or positive activities to help reduce smoking triggers.  - Will continue to monitor.

## 2020-04-12 NOTE — Assessment & Plan Note (Addendum)
-   BP today is 128/93, stable - Encouraged to reduce sodium and continue to stay well hydrated, at least 64 fl oz daily. - Encourage ambulatory BP and pulse monitoring few times/wk and if BP consistently >130/80 then should notify the office since he isn't on antihypertensives at this time. - Stay as active as possible.

## 2020-04-12 NOTE — Assessment & Plan Note (Signed)
-   Encouraged to follow low sodium diet and elevation. - Cardiologist ordered CMP to evaluate kidney function and if stable, will trial Lasix. Labs collected today.

## 2020-04-12 NOTE — Patient Instructions (Signed)
Coping with Quitting Smoking  Quitting smoking is a physical and mental challenge. You will face cravings, withdrawal symptoms, and temptation. Before quitting, work with your health care provider to make a plan that can help you cope. Preparation can help you quit and keep you from giving in. How can I cope with cravings? Cravings usually last for 5-10 minutes. If you get through it, the craving will pass. Consider taking the following actions to help you cope with cravings:  Keep your mouth busy: ? Chew sugar-free gum. ? Suck on hard candies or a straw. ? Brush your teeth.  Keep your hands and body busy: ? Immediately change to a different activity when you feel a craving. ? Squeeze or play with a ball. ? Do an activity or a hobby, like making bead jewelry, practicing needlepoint, or working with wood. ? Mix up your normal routine. ? Take a short exercise break. Go for a quick walk or run up and down stairs. ? Spend time in public places where smoking is not allowed.  Focus on doing something kind or helpful for someone else.  Call a friend or family member to talk during a craving.  Join a support group.  Call a quit line, such as 1-800-QUIT-NOW.  Talk with your health care provider about medicines that might help you cope with cravings and make quitting easier for you. How can I deal with withdrawal symptoms? Your body may experience negative effects as it tries to get used to not having nicotine in the system. These effects are called withdrawal symptoms. They may include:  Feeling hungrier than normal.  Trouble concentrating.  Irritability.  Trouble sleeping.  Feeling depressed.  Restlessness and agitation.  Craving a cigarette. To manage withdrawal symptoms:  Avoid places, people, and activities that trigger your cravings.  Remember why you want to quit.  Get plenty of sleep.  Avoid coffee and other caffeinated drinks. These may worsen some of your  symptoms. How can I handle social situations? Social situations can be difficult when you are quitting smoking, especially in the first few weeks. To manage this, you can:  Avoid parties, bars, and other social situations where people might be smoking.  Avoid alcohol.  Leave right away if you have the urge to smoke.  Explain to your family and friends that you are quitting smoking. Ask for understanding and support.  Plan activities with friends or family where smoking is not an option. What are some ways I can cope with stress? Wanting to smoke may cause stress, and stress can make you want to smoke. Find ways to manage your stress. Relaxation techniques can help. For example:  Breathe slowly and deeply, in through your nose and out through your mouth.  Listen to soothing, relaxing music.  Talk with a family member or friend about your stress.  Light a candle.  Soak in a bath or take a shower.  Think about a peaceful place. What are some ways I can prevent weight gain? Be aware that many people gain weight after they quit smoking. However, not everyone does. To keep from gaining weight, have a plan in place before you quit and stick to the plan after you quit. Your plan should include:  Having healthy snacks. When you have a craving, it may help to: ? Eat plain popcorn, crunchy carrots, celery, or other cut vegetables. ? Chew sugar-free gum.  Changing how you eat: ? Eat small portion sizes at meals. ? Eat 4-6 small meals   throughout the day instead of 1-2 large meals a day. ? Be mindful when you eat. Do not watch television or do other things that might distract you as you eat.  Exercising regularly: ? Make time to exercise each day. If you do not have time for a long workout, do short bouts of exercise for 5-10 minutes several times a day. ? Do some form of strengthening exercise, like weight lifting, and some form of aerobic exercise, like running or swimming.  Drinking  plenty of water or other low-calorie or no-calorie drinks. Drink 6-8 glasses of water daily, or as much as instructed by your health care provider. Summary  Quitting smoking is a physical and mental challenge. You will face cravings, withdrawal symptoms, and temptation to smoke again. Preparation can help you as you go through these challenges.  You can cope with cravings by keeping your mouth busy (such as by chewing gum), keeping your body and hands busy, and making calls to family, friends, or a helpline for people who want to quit smoking.  You can cope with withdrawal symptoms by avoiding places where people smoke, avoiding drinks with caffeine, and getting plenty of rest.  Ask your health care provider about the different ways to prevent weight gain, avoid stress, and handle social situations. This information is not intended to replace advice given to you by your health care provider. Make sure you discuss any questions you have with your health care provider. Document Revised: 09/17/2017 Document Reviewed: 10/02/2016 Elsevier Patient Education  2020 [employer] Inc.  

## 2020-04-13 LAB — COMPREHENSIVE METABOLIC PANEL
ALT: 16 IU/L (ref 0–44)
AST: 17 IU/L (ref 0–40)
Albumin/Globulin Ratio: 1.4 (ref 1.2–2.2)
Albumin: 4.3 g/dL (ref 3.8–4.9)
Alkaline Phosphatase: 121 IU/L (ref 48–121)
BUN/Creatinine Ratio: 12 (ref 9–20)
BUN: 13 mg/dL (ref 6–24)
Bilirubin Total: 0.8 mg/dL (ref 0.0–1.2)
CO2: 22 mmol/L (ref 20–29)
Calcium: 8.9 mg/dL (ref 8.7–10.2)
Chloride: 105 mmol/L (ref 96–106)
Creatinine, Ser: 1.13 mg/dL (ref 0.76–1.27)
GFR calc Af Amer: 87 mL/min/{1.73_m2} (ref >59)
GFR calc non Af Amer: 75 mL/min/{1.73_m2} (ref >59)
Globulin, Total: 3 g/dL (ref 1.5–4.5)
Glucose: 122 mg/dL — ABNORMAL HIGH (ref 65–99)
Potassium: 4.3 mmol/L (ref 3.5–5.2)
Sodium: 140 mmol/L (ref 134–144)
Total Protein: 7.3 g/dL (ref 6.0–8.5)

## 2020-04-13 LAB — BRAIN NATRIURETIC PEPTIDE: BNP: 32.4 pg/mL (ref 0.0–100.0)

## 2020-04-13 LAB — LDL CHOLESTEROL, DIRECT: LDL Direct: 29 mg/dL (ref 0–99)

## 2020-04-16 ENCOUNTER — Telehealth: Payer: Self-pay | Admitting: *Deleted

## 2020-04-16 NOTE — Telephone Encounter (Signed)
Left message for patient to call regarding 3 month follow up appointment with Dr. Gardiner Rhyme

## 2020-04-16 NOTE — Telephone Encounter (Signed)
Spoke with patient regarding new appointment date and time with Dr. Ivor Costa 07/22/20 at 3:00pm---will mail information to patient---he voiced his understanding.

## 2020-04-17 ENCOUNTER — Other Ambulatory Visit: Payer: Self-pay | Admitting: *Deleted

## 2020-04-17 MED ORDER — FUROSEMIDE 40 MG PO TABS
40.0000 mg | ORAL_TABLET | Freq: Every day | ORAL | 3 refills | Status: DC | PRN
Start: 2020-04-17 — End: 2022-09-22

## 2020-04-21 NOTE — Progress Notes (Signed)
Subjective:  Patient ID: Eduardo [name], male    DOB: 1970-12-03,  MRN: [id number]  Chief Complaint  Patient presents with  . Foot Ulcer    the right big toe is swollen and does still have a spot on the back side of the big toe     DOS: 06/14/2019 Procedures:             1) Right Hallux Arthroplasty             2) Right Hallux Excision of Ulcer             3) Adjacent Tissue Transfer - Single Lobe Flap             4) Excision of Interphalangeal Sesamoid Bone  49 y.o. male returns for post-op check.  States the tonsil swollen but does not hurt.  States that the shoe helps.  No burning throbbing or tingling occasional numbness.  Review of Systems: Negative except as noted in the HPI. Denies N/V/F/Ch.  Past Medical History:  Diagnosis Date  . Anxiety   . Chronic back pain   . Degenerative disk disease   . Depression   . DVT of upper extremity (deep vein thrombosis) ([place])   . GERD (gastroesophageal reflux disease)   . Hypertension   . MRSA (methicillin resistant staph aureus) culture positive   . PAD (peripheral artery disease) ([place])   . Peripheral neuropathy   . Pilonidal cyst   . Pulmonary embolus ([place])    no blood thinner 2002  . Sleep apnea    can't use cpap due to deviated nasal septumg    Current Outpatient Medications:  .  Acetaminophen (TYLENOL ARTHRITIS PAIN PO), Take 600 mg by mouth 3 (three) times daily., Disp: , Rfl:  .  amitriptyline (ELAVIL) 10 MG tablet, Take 1 tablet (10 mg total) by mouth at bedtime., Disp: 30 tablet, Rfl: 3 .  atorvastatin (LIPITOR) 10 MG tablet, Take 1 tablet (10 mg total) by mouth daily., Disp: 90 tablet, Rfl: 3 .  fluticasone (FLONASE) 50 MCG/ACT nasal spray, 1 spray each nostril twice daily after sinus rinses, Disp: 16 g, Rfl: 2 .  furosemide (LASIX) 40 MG tablet, Take 1 tablet (40 mg total) by mouth daily as needed (if weight increases 3 lbs overnight or 5 lbs in 1 week)., Disp: 30 tablet, Rfl: 3 .  montelukast (SINGULAIR) 10 MG  tablet, Take 1 tablet (10 mg total) by mouth at bedtime., Disp: 90 tablet, Rfl: 0 .  Nutritional Supplements (JUICE PLUS FIBRE PO), Take 4 each by mouth daily with lunch., Disp: , Rfl:  .  oxyCODONE-acetaminophen (PERCOCET) 7.5-325 MG tablet, Take 1 tablet by mouth 5 (five) times daily as needed., Disp: 300 tablet, Rfl: 0 .  pregabalin (LYRICA) 300 MG capsule, Take 1 capsule (300 mg total) by mouth 2 (two) times daily., Disp: 60 capsule, Rfl: 2 .  sulfamethoxazole-trimethoprim (BACTRIM DS) 800-160 MG tablet, Take 1 tablet by mouth 2 (two) times daily., Disp: 120 tablet, Rfl: 3 .  varenicline (CHANTIX) 1 MG tablet, Take 1 tablet (1 mg total) by mouth 2 (two) times daily., Disp: 180 tablet, Rfl: 0  Social History   Tobacco Use  Smoking Status Current Every Day Smoker  . Packs/day: 0.25  . Years: 33.00  . Pack years: 8.25  . Types: Cigarettes  Smokeless Tobacco Never Used  Tobacco Comment   06/28/19 1/4 pack per day    Allergies  Allergen Reactions  . Pollen Extract Cough  .  Dust Mite Extract Other (See Comments)  . Other Other (See Comments)    Ragweed    Objective:   There were no vitals filed for this visit. There is no height or weight on file to calculate BMI. Constitutional Well developed. Well nourished.  Vascular Foot warm and well perfused. Capillary refill normal to all digits.   Neurologic Normal speech. Oriented to person, place, and time. Epicritic sensation to light touch grossly present bilaterally.  Dermatologic Skin well healed. Toe edematous.  Small recurrence of ulceration at the plantar medial aspect of the digit.  Small dorsal abrasions  Orthopedic: No tenderness to palpation noted about the surgical site. Hallux IPJ ROM appreciated.   Radiographs: None today. Assessment:   1. Post-operative state    Plan:  Patient was evaluated and treated and all questions answered.  S/p foot surgery right -Continue awaiting edema reduction.  Okay to transition to  normal shoe gear when tolerated.  Follow-up in 1 month for recheck  Return in about 1 month (around 10/01/2019) for Post-Op (No XRs).

## 2020-04-21 NOTE — Progress Notes (Signed)
Subjective:  Patient ID: Eduardo [name], male    DOB: 1971/08/10,  MRN: [id number]  Chief Complaint  Patient presents with  . Routine Post Op    Pt states concern that his right 1st digit is still swollen. Pt does have some discomfort when trying to wear regular shoes.    DOS: 06/14/2019 Procedures:             1) Right Hallux Arthroplasty             2) Right Hallux Excision of Ulcer             3) Adjacent Tissue Transfer - Single Lobe Flap             4) Excision of Interphalangeal Sesamoid Bone  49 y.o. male returns for post-op check.  Concerned about the swelling in his toe.  Review of Systems: Negative except as noted in the HPI. Denies N/V/F/Ch.  Past Medical History:  Diagnosis Date  . Anxiety   . Chronic back pain   . Degenerative disk disease   . Depression   . DVT of upper extremity (deep vein thrombosis) ([place])   . GERD (gastroesophageal reflux disease)   . Hypertension   . MRSA (methicillin resistant staph aureus) culture positive   . PAD (peripheral artery disease) ([place])   . Peripheral neuropathy   . Pilonidal cyst   . Pulmonary embolus ([place])    no blood thinner 2002  . Sleep apnea    can't use cpap due to deviated nasal septumg    Current Outpatient Medications:  .  Nutritional Supplements (JUICE PLUS FIBRE PO), Take 4 each by mouth daily with lunch., Disp: , Rfl:  .  Acetaminophen (TYLENOL ARTHRITIS PAIN PO), Take 600 mg by mouth 3 (three) times daily., Disp: , Rfl:  .  amitriptyline (ELAVIL) 10 MG tablet, Take 1 tablet (10 mg total) by mouth at bedtime., Disp: 30 tablet, Rfl: 3 .  atorvastatin (LIPITOR) 10 MG tablet, Take 1 tablet (10 mg total) by mouth daily., Disp: 90 tablet, Rfl: 3 .  fluticasone (FLONASE) 50 MCG/ACT nasal spray, 1 spray each nostril twice daily after sinus rinses, Disp: 16 g, Rfl: 2 .  furosemide (LASIX) 40 MG tablet, Take 1 tablet (40 mg total) by mouth daily as needed (if weight increases 3 lbs overnight or 5 lbs in 1 week).,  Disp: 30 tablet, Rfl: 3 .  montelukast (SINGULAIR) 10 MG tablet, Take 1 tablet (10 mg total) by mouth at bedtime., Disp: 90 tablet, Rfl: 0 .  oxyCODONE-acetaminophen (PERCOCET) 7.5-325 MG tablet, Take 1 tablet by mouth 5 (five) times daily as needed., Disp: 300 tablet, Rfl: 0 .  pregabalin (LYRICA) 300 MG capsule, Take 1 capsule (300 mg total) by mouth 2 (two) times daily., Disp: 60 capsule, Rfl: 2 .  sulfamethoxazole-trimethoprim (BACTRIM DS) 800-160 MG tablet, Take 1 tablet by mouth 2 (two) times daily., Disp: 120 tablet, Rfl: 3 .  varenicline (CHANTIX) 1 MG tablet, Take 1 tablet (1 mg total) by mouth 2 (two) times daily., Disp: 180 tablet, Rfl: 0  Social History   Tobacco Use  Smoking Status Current Every Day Smoker  . Packs/day: 0.25  . Years: 33.00  . Pack years: 8.25  . Types: Cigarettes  Smokeless Tobacco Never Used  Tobacco Comment   06/28/19 1/4 pack per day    Allergies  Allergen Reactions  . Pollen Extract Cough  . Dust Mite Extract Other (See Comments)  . Other Other (See Comments)  Ragweed    Objective:   There were no vitals filed for this visit. There is no height or weight on file to calculate BMI. Constitutional Well developed. Well nourished.  Vascular Foot warm and well perfused. Capillary refill normal to all digits.   Neurologic Normal speech. Oriented to person, place, and time. Epicritic sensation to light touch grossly present bilaterally.  Dermatologic Skin well healed. Toe edematous  Orthopedic: No tenderness to palpation noted about the surgical site. Hallux IPJ ROM appreciated.   Radiographs: None today. Assessment:   1. Arthritis of big toe    Plan:  Patient was evaluated and treated and all questions answered.  S/p foot surgery right -Covan reapplied for edema reduction.  Advised that edema will reduce with time.  Transition weightbearing to surgical shoe  No follow-ups on file.

## 2020-04-30 ENCOUNTER — Other Ambulatory Visit: Payer: Self-pay | Admitting: Cardiology

## 2020-04-30 ENCOUNTER — Other Ambulatory Visit: Payer: Self-pay | Admitting: Family Medicine

## 2020-04-30 DIAGNOSIS — J302 Other seasonal allergic rhinitis: Secondary | ICD-10-CM

## 2020-04-30 DIAGNOSIS — I739 Peripheral vascular disease, unspecified: Secondary | ICD-10-CM

## 2020-04-30 DIAGNOSIS — J3089 Other allergic rhinitis: Secondary | ICD-10-CM

## 2020-05-02 ENCOUNTER — Ambulatory Visit (HOSPITAL_COMMUNITY)
Admission: RE | Admit: 2020-05-02 | Discharge: 2020-05-02 | Disposition: A | Discharge status: Home / Self Care | Payer: BC Managed Care – PPO | Source: Ambulatory Visit | Attending: Cardiovascular Disease | Admitting: Cardiovascular Disease

## 2020-05-02 ENCOUNTER — Other Ambulatory Visit: Payer: Self-pay

## 2020-05-02 DIAGNOSIS — R600 Localized edema: Secondary | ICD-10-CM

## 2020-05-02 DIAGNOSIS — I739 Peripheral vascular disease, unspecified: Secondary | ICD-10-CM | POA: Insufficient documentation

## 2020-06-05 ENCOUNTER — Encounter
Payer: BC Managed Care – PPO | Attending: Physical Medicine & Rehabilitation | Admitting: Physical Medicine and Rehabilitation

## 2020-06-05 ENCOUNTER — Encounter: Payer: Self-pay | Admitting: Physical Medicine and Rehabilitation

## 2020-06-05 ENCOUNTER — Other Ambulatory Visit: Payer: Self-pay

## 2020-06-05 VITALS — BP 143/89 | HR 76 | Temp 98.5°F | Ht 77.0 in | Wt 366.4 lb

## 2020-06-05 DIAGNOSIS — M25562 Pain in left knee: Secondary | ICD-10-CM

## 2020-06-05 DIAGNOSIS — G894 Chronic pain syndrome: Secondary | ICD-10-CM | POA: Insufficient documentation

## 2020-06-05 DIAGNOSIS — Z5181 Encounter for therapeutic drug level monitoring: Secondary | ICD-10-CM | POA: Insufficient documentation

## 2020-06-05 DIAGNOSIS — R600 Localized edema: Secondary | ICD-10-CM | POA: Insufficient documentation

## 2020-06-05 DIAGNOSIS — G608 Other hereditary and idiopathic neuropathies: Secondary | ICD-10-CM | POA: Diagnosis not present

## 2020-06-05 DIAGNOSIS — M79672 Pain in left foot: Secondary | ICD-10-CM | POA: Diagnosis not present

## 2020-06-05 DIAGNOSIS — M51369 Other intervertebral disc degeneration, lumbar region without mention of lumbar back pain or lower extremity pain: Secondary | ICD-10-CM

## 2020-06-05 DIAGNOSIS — R0601 Orthopnea: Secondary | ICD-10-CM | POA: Diagnosis present

## 2020-06-05 DIAGNOSIS — G609 Hereditary and idiopathic neuropathy, unspecified: Secondary | ICD-10-CM

## 2020-06-05 DIAGNOSIS — M79671 Pain in right foot: Secondary | ICD-10-CM | POA: Diagnosis not present

## 2020-06-05 DIAGNOSIS — Z79891 Long term (current) use of opiate analgesic: Secondary | ICD-10-CM | POA: Insufficient documentation

## 2020-06-05 DIAGNOSIS — Z6841 Body Mass Index (BMI) 40.0 and over, adult: Secondary | ICD-10-CM

## 2020-06-05 DIAGNOSIS — M21611 Bunion of right foot: Secondary | ICD-10-CM | POA: Diagnosis not present

## 2020-06-05 DIAGNOSIS — G8929 Other chronic pain: Secondary | ICD-10-CM

## 2020-06-05 DIAGNOSIS — M21612 Bunion of left foot: Secondary | ICD-10-CM | POA: Diagnosis not present

## 2020-06-05 DIAGNOSIS — M25571 Pain in right ankle and joints of right foot: Secondary | ICD-10-CM

## 2020-06-05 DIAGNOSIS — M25572 Pain in left ankle and joints of left foot: Secondary | ICD-10-CM

## 2020-06-05 DIAGNOSIS — E538 Deficiency of other specified B group vitamins: Secondary | ICD-10-CM | POA: Diagnosis present

## 2020-06-05 DIAGNOSIS — Z79899 Other long term (current) drug therapy: Secondary | ICD-10-CM | POA: Diagnosis present

## 2020-06-05 DIAGNOSIS — M5136 Other intervertebral disc degeneration, lumbar region: Secondary | ICD-10-CM

## 2020-06-05 MED ORDER — OXYCODONE-ACETAMINOPHEN 7.5-325 MG PO TABS: 1.0000 | ORAL_TABLET | Freq: Every day | ORAL | 0 refills | Status: DC | PRN

## 2020-06-05 MED ORDER — SULFAMETHOXAZOLE-TRIMETHOPRIM 800-160 MG PO TABS: 1.0000 | ORAL_TABLET | Freq: Two times a day (BID) | ORAL | 3 refills | Status: DC

## 2020-06-05 MED ORDER — AMITRIPTYLINE HCL 10 MG PO TABS: 10.0000 mg | ORAL_TABLET | Freq: Every day | ORAL | 3 refills | Status: DC

## 2020-06-05 MED ORDER — PREGABALIN 300 MG PO CAPS: 300.0000 mg | ORAL_CAPSULE | Freq: Two times a day (BID) | ORAL | 2 refills | Status: DC

## 2020-06-05 NOTE — Progress Notes (Signed)
Subjective:    Patient ID: Eduardo [name], male    DOB: 1971/10/15, 49 y.o.   MRN: [id number]  HPI  Eduardo [name] is a 49 year old man who presents for follow-up of his bilateral ankle, knee pain.   He has using ice pads on his ankle and knees. The Percocet has been helping.   BP 143/89, improved from last visit.  Has lost one pound as well. Has been living with his sister and they have been cooking together.  He looks more energetic and vibrant!     Pain Inventory Average Pain 9 Pain Right Now 7 My pain is constant, sharp, burning, dull, stabbing, tingling and aching  In the last 24 hours, has pain interfered with the following? General activity 9 Relation with others 7 Enjoyment of life 10 What TIME of day is your pain at its worst? morning , daytime, evening and night Sleep (in general) Poor  Pain is worse with: walking, bending, sitting and standing Pain improves with: rest and medication Relief from Meds: 7  Family History  Problem Relation Age of Onset  . Cancer Maternal Grandmother        breast, ovarian & colon  . Hypertension Mother   . Diabetes Mother   . Hypertension Father   . Heart disease Father   . Stroke Paternal Grandfather   . Neuropathy Neg Hx    Social History   Socioeconomic History  . Marital status: Divorced    Spouse name: Not on file  . Number of children: 2  . Years of education: 12th   . Highest education level: Not on file  Occupational History  . Occupation: [name]  Tobacco Use  . Smoking status: Current Every Day Smoker    Packs/day: 0.25    Years: 33.00    Pack years: 8.25    Types: Cigarettes  . Smokeless tobacco: Never Used  . Tobacco comment: 06/28/19 1/4 pack per day  Vaping Use  . Vaping Use: Former  Substance and Sexual Activity  . Alcohol use: No    Alcohol/week: 0.0 standard drinks    Comment: Rarely  . Drug use: No  . Sexual activity: Yes    Birth control/protection: None  Other Topics Concern  . Not on  file  Social History Narrative   Lives at home w/ his step-mother   Right-handed   Drinks about 2-3 [place] a day   Social Determinants of Health   Financial Resource Strain:   . Difficulty of Paying Living Expenses:   Food Insecurity:   . Worried About Charity fundraiser in the Last Year:   . Arboriculturist in the Last Year:   Transportation Needs:   . Film/video editor (Medical):   [name] Lack of Transportation (Non-Medical):   Physical Activity:   . Days of Exercise per Week:   . Minutes of Exercise per Session:   Stress:   . Feeling of Stress :   Social Connections:   . Frequency of Communication with Friends and Family:   . Frequency of Social Gatherings with Friends and Family:   . Attends Religious Services:   . Active Member of Clubs or Organizations:   . Attends Archivist Meetings:   [name] Marital Status:    Past Surgical History:  Procedure Laterality Date  . Irrigation and debridement of back abscess    . MASS EXCISION Right 06/14/2019   Procedure: EXCISION BENIGN LESION RIGHT FOOT;  Surgeon: March Rummage,  Christian Mate, DPM;  Location: [facility];  Service: Podiatry;  Laterality: Right;  . NASAL SEPTOPLASTY W/ TURBINOPLASTY [name] 02/03/2018   Procedure: NASAL SEPTOPLASTY WITH TURBINATE REDUCTION;  Surgeon: [name], MD;  Location: [facility];  Service: ENT;  Laterality: [name];  . PILONIDAL CYST DRAINAGE [name] 04/10/2017   Procedure: IRRIGATION AND DEBRIDEMENT BACK ABSCESS;  Surgeon: [name], MD;  Location: [facility];  Service: General;  Laterality: [name];  . SINUS ENDO WITH FUSION [name] 02/03/2018   Procedure: ENDOSCOPIC SINUS SURGERY WITH FUSION;  Surgeon: [name], MD;  Location: [facility];  Service: ENT;  Laterality: [name];  . THORACIC OUTLET SURGERY  2000  . TOE ARTHROPLASTY Right 06/14/2019   Procedure: HALLUX ARTHROPLASTY RIGHT FOOT WITH TISSUE TRANSER EXCISION OF SESAMOID BONE;  Surgeon: [name], DPM;  Location: [facility];  Service: Podiatry;  Laterality: Right;  . WRIST  FUSION  2002   Past Surgical History:  Procedure Laterality Date  . Irrigation and debridement of back abscess    . MASS EXCISION Right 06/14/2019   Procedure: EXCISION BENIGN LESION RIGHT FOOT;  Surgeon: [name], DPM;  Location: [facility];  Service: Podiatry;  Laterality: Right;  . NASAL SEPTOPLASTY W/ TURBINOPLASTY [name] 02/03/2018   Procedure: NASAL SEPTOPLASTY WITH TURBINATE REDUCTION;  Surgeon: [name], MD;  Location: [facility];  Service: ENT;  Laterality: [name];  . PILONIDAL CYST DRAINAGE [name] 04/10/2017   Procedure: IRRIGATION AND DEBRIDEMENT BACK ABSCESS;  Surgeon: [name], MD;  Location: [facility];  Service: General;  Laterality: [name];  . SINUS ENDO WITH FUSION [name] 02/03/2018   Procedure: ENDOSCOPIC SINUS SURGERY WITH FUSION;  Surgeon: [name], MD;  Location: [facility];  Service: ENT;  Laterality: [name];  . THORACIC OUTLET SURGERY  2000  . TOE ARTHROPLASTY Right 06/14/2019   Procedure: HALLUX ARTHROPLASTY RIGHT FOOT WITH TISSUE TRANSER EXCISION OF SESAMOID BONE;  Surgeon: [name], DPM;  Location: [facility];  Service: Podiatry;  Laterality: Right;  . WRIST FUSION  2002   Past Medical History:  Diagnosis Date  . Anxiety   . Chronic back pain   . Degenerative disk disease   . Depression   . DVT of upper extremity (deep vein thrombosis) ([place])   . GERD (gastroesophageal reflux disease)   . Hypertension   . MRSA (methicillin resistant staph aureus) culture positive   . PAD (peripheral artery disease) ([place])   . Peripheral neuropathy   . Pilonidal cyst   . Pulmonary embolus ([place])    no blood thinner 2002  . Sleep apnea    can't use cpap due to deviated nasal septumg   BP (!) 143/89   Pulse 76   Temp 98.5 F (36.9 C)   Ht 6\' 5"  (1.956 m)   Wt (!) 366 lb 6.4 oz (166.2 kg)   SpO2 94%   BMI 43.45 kg/m   Opioid Risk Score:   Fall Risk Score:  `1  Depression screen PHQ 2/9  Depression screen [facility] 2/9 06/05/2020 04/12/2020 04/05/2020 03/13/2020 02/27/2020 01/23/2020 01/16/2020    Decreased Interest 1 2 1 1 1 1 3   Down, Depressed, Hopeless 1 1 1 1  0 1 0  PHQ - 2 Score 2 3 2 2 1 2 3   Altered sleeping - 2 - - 3 - 3  Tired, decreased energy - 3 - - 3 - 3  Change in appetite - 3 - - 2 - 3  Feeling bad or failure about yourself  - 0 - -  0 - 1  Trouble concentrating - 3 - - 3 - 3  Moving slowly or fidgety/restless - 1 - - 0 - 0  Suicidal thoughts - 0 - - 0 - 0  PHQ-9 Score - 15 - - 12 - 16  Difficult doing work/chores - Very difficult - - Very difficult - Extremely dIfficult  Some recent data might be hidden   Review of Systems  Constitutional: Negative.   HENT: Negative.   Eyes: Negative.   Respiratory: Negative.   Cardiovascular: Positive for leg swelling.  Gastrointestinal: Negative.   Endocrine: Negative.   Genitourinary: Negative.   Musculoskeletal: Positive for back pain and gait problem.       Spasms  Skin: Negative.   Allergic/Immunologic: Negative.   Neurological: Positive for weakness and numbness.       Tingling  Hematological: Negative.   Psychiatric/Behavioral: Positive for dysphoric mood. The patient is nervous/anxious.   All other systems reviewed and are negative.      Objective:   Physical Exam Gen: no distress, normal appearing HEENT: oral mucosa pink and moist, NCAT Cardio: Reg rate Chest: normal effort, normal rate of breathing Abd: soft, non-distended Ext: no edema Skin: intact Neuro: Alert and oriented x3 Musculoskeletal: Bilateral lower extremities edematous and tender to palpation at bilateral knees and ankles Psych: pleasant, normal affect       Assessment & Plan:  1.Chronic idiopathic axonal polyneuropathy: Continue Lyrica. Refilled today.   2. Posterior tibial tendinitis of left lower extremity: Continue HEP as Tolerated. Continue to Monitor.  3. Lumbar DDD/ Lumbar Radiculitis: Continue current medication regime and HEP as Tolerated.  4. Chronic Pain Syndrome: Continue Percocet 7.5/325mg one tablet5times  daily as needed for pain. This has resulted in improved pain control, especially when he has to work 10 hour long days. Refilled today.   5. Obesity: Discussed good exercise and nutrition habits. Current weight is 366 lbs, down 1 lb since last visit!  6. Insomnia: Continue Amitriptyline as needed HS. Sent 10mg  tablets so he will not have to break his current tablets. Refilled today.   7. Pilonidal cyst: Reviewed ID note. Recommended ID follow-up. Co-pay is expensive for patient and is the reason he could not follow-up. He cannot pursue surgery at this time due to cost. Patient has been taking medication for 9 months and tolerating it well. Discussed side effects of long term use of antibiotics. Prescribed DS Bactrim as previously ordered by ID. Refilled today.   8) BP reviewed and better controlled this visit!  All questions were encouraged and answered. Follow up with me in 2 months.

## 2020-06-10 ENCOUNTER — Other Ambulatory Visit: Payer: Self-pay | Admitting: Family Medicine

## 2020-06-10 DIAGNOSIS — J3089 Other allergic rhinitis: Secondary | ICD-10-CM

## 2020-06-10 DIAGNOSIS — J302 Other seasonal allergic rhinitis: Secondary | ICD-10-CM

## 2020-06-25 ENCOUNTER — Encounter: Payer: Self-pay | Admitting: Dermatology

## 2020-06-25 ENCOUNTER — Other Ambulatory Visit: Payer: Self-pay

## 2020-06-25 ENCOUNTER — Ambulatory Visit: Payer: BC Managed Care – PPO | Admitting: Dermatology

## 2020-06-25 DIAGNOSIS — Z1283 Encounter for screening for malignant neoplasm of skin: Secondary | ICD-10-CM

## 2020-06-25 DIAGNOSIS — D2372 Other benign neoplasm of skin of left lower limb, including hip: Secondary | ICD-10-CM | POA: Diagnosis not present

## 2020-06-25 DIAGNOSIS — L918 Other hypertrophic disorders of the skin: Secondary | ICD-10-CM

## 2020-06-25 DIAGNOSIS — L738 Other specified follicular disorders: Secondary | ICD-10-CM

## 2020-06-25 DIAGNOSIS — Z8614 Personal history of Methicillin resistant Staphylococcus aureus infection: Secondary | ICD-10-CM

## 2020-06-25 DIAGNOSIS — D2371 Other benign neoplasm of skin of right lower limb, including hip: Secondary | ICD-10-CM | POA: Diagnosis not present

## 2020-06-25 DIAGNOSIS — Z872 Personal history of diseases of the skin and subcutaneous tissue: Secondary | ICD-10-CM

## 2020-06-25 DIAGNOSIS — L905 Scar conditions and fibrosis of skin: Secondary | ICD-10-CM

## 2020-06-25 DIAGNOSIS — D239 Other benign neoplasm of skin, unspecified: Secondary | ICD-10-CM

## 2020-06-25 NOTE — Progress Notes (Signed)
     To watch

## 2020-06-25 NOTE — Patient Instructions (Addendum)
First visit for Eduardo [name] date of birth August 30, 1971.  General skin examination of legs and all the skin from the waist up showed no atypical moles, melanoma, or nonmole skin cancer.  He has some small sebaceous hyperplasia on his forehead which is safe to ignore if stable, and multiple skin tags under the arm more than on the neck or the leg crease.  He has a large scar across his left mid back where a MRSA infection was drained and history of pilonidal cysts with some recurrence and scarring in this area.  He was concerned about two dark nodules on his legs, 1 left inner shin and the other on the right calf.  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  3 options given to [name]: #1 leave them if they are stable; #2 simple shave biopsy to be positive they are benign; and #3 schedule an hour to have excision done (unfortunately this will be an area of poor healing).  He will think this over and if everything is stable I will plan on doing a general skin check in 1 year.

## 2020-06-26 ENCOUNTER — Ambulatory Visit: Payer: BC Managed Care – PPO | Admitting: Psychology

## 2020-07-11 ENCOUNTER — Other Ambulatory Visit: Payer: Self-pay

## 2020-07-11 ENCOUNTER — Encounter: Payer: Self-pay | Admitting: Physician Assistant

## 2020-07-11 ENCOUNTER — Ambulatory Visit (INDEPENDENT_AMBULATORY_CARE_PROVIDER_SITE_OTHER): Payer: BC Managed Care – PPO | Admitting: Physician Assistant

## 2020-07-11 VITALS — Ht 77.0 in | Wt 355.0 lb

## 2020-07-11 DIAGNOSIS — R600 Localized edema: Secondary | ICD-10-CM

## 2020-07-11 DIAGNOSIS — I10 Essential (primary) hypertension: Secondary | ICD-10-CM

## 2020-07-11 DIAGNOSIS — R0981 Nasal congestion: Secondary | ICD-10-CM | POA: Diagnosis not present

## 2020-07-11 DIAGNOSIS — J310 Chronic rhinitis: Secondary | ICD-10-CM

## 2020-07-11 DIAGNOSIS — E781 Pure hyperglyceridemia: Secondary | ICD-10-CM

## 2020-07-11 DIAGNOSIS — F17200 Nicotine dependence, unspecified, uncomplicated: Secondary | ICD-10-CM

## 2020-07-11 DIAGNOSIS — J329 Chronic sinusitis, unspecified: Secondary | ICD-10-CM

## 2020-07-11 MED ORDER — AZELASTINE HCL 0.1 % NA SOLN: 1.0000 | Freq: Two times a day (BID) | NASAL | 1 refills | Status: DC

## 2020-07-11 NOTE — Progress Notes (Signed)
Telehealth office visit note for Eduardo Reid, PA-C- at [facility]   I connected with current patient today by telephone and verified that I am speaking with the correct person   . Location of the patient: Home . Location of the provider: Office - This visit type was conducted due to national recommendations for restrictions regarding the COVID-19 Pandemic (e.g. social distancing) in an effort to limit this patient's exposure and mitigate transmission in our community.    - No physical exam could be performed with this format, beyond that communicated to [place] by the patient/ family members as noted.   - Additionally my office staff/ schedulers were to discuss with the patient that there may be a monetary charge related to this service, depending on their medical insurance.  My understanding is that patient understood and consented to proceed.     _________________________________________________________________________________   History of Present Illness: Pt calls in to follow-up on hypertension, hypertriglyceridemia, and tobacco use. Reports he is doing pretty good.  HTN: Pt denies chest pain, palpitations, dizziness or increased lower extremity swelling. States his lower extremity edema has improved and has not needed to take Lasix. Checks BP at home and readings average 130s/80s. Pt has tried reducing sodium intake.  Hypertriglyceridemia: Taking medication without issues. Denies side effects.   Tobacco use: Reports he self-discontinued Chantix due to increased personal stress which triggered his smoking use. States he has been trying to move for the past 2 months and finally moved into his new apartment. At this time he is not ready to quit.  Nasal congestion: Reports he has been using Flonase with minimal relief for nasal congestion. Inquiring about an alternative. Reports in the past he has tried nasal rinses with saline which made him swell up so doesn't do  them anymore.      GAD 7 : Generalized Anxiety Score 07/11/2020 02/27/2020 01/16/2020 05/20/2017  Nervous, Anxious, on Edge 2 0 2 3  Control/stop worrying 1 0 2 3  Worry too much - different things 1 1 3 3   Trouble relaxing 3 3 3 3   Restless 2 0 3 3  Easily annoyed or irritable 1 1 0 3  Afraid - awful might happen 0 0 0 2  Total GAD 7 Score 10 5 13 20   Anxiety Difficulty Very difficult Very difficult Very difficult Very difficult    Depression screen [facility] 2/9 07/11/2020 06/05/2020 04/12/2020 04/05/2020 03/13/2020  Decreased Interest 0 1 2 1 1   Down, Depressed, Hopeless 0 1 1 1 1   PHQ - 2 Score 0 2 3 2 2   Altered sleeping 3 - 2 - -  Tired, decreased energy 3 - 3 - -  Change in appetite 1 - 3 - -  Feeling bad or failure about yourself  0 - 0 - -  Trouble concentrating 3 - 3 - -  Moving slowly or fidgety/restless 3 - 1 - -  Suicidal thoughts 0 - 0 - -  PHQ-9 Score 13 - 15 - -  Difficult doing work/chores - - Very difficult - -  Some recent data might be hidden      Impression and Recommendations:     1. Hypertension, unspecified type   2. Lower extremity edema   3. Hypertriglyceridemia   4. Nasal congestion   5. Rhinosinusitis   6. TOBACCO ABUSE     HTN: - Per patient's ambulatory BP readings, BP has remained stable.  - Continue with ambulatory  BP monitoring and advised to notify the clinic if BP consistently >135/85. - Follow a low sodium diet. - Stay as active as possible. - Will continue to monitor alongside cardiology.  Hypertriglyceridemia:  -Followed by cardiology. -Last direct LDL 29 -Continue current medication regimen. -Follow a heart healthy diet and stay active.  Tobacco Abuse: -Patient self-discontinued Chantix and recommend to find alternatives to coping with stress and work on reducing smoking use. -Will continue to monitor.  Lower extremity edema: -Improved -Recommend to follow low sodium diet, elevation and use Lasix as needed for edema.  Nasal  congestion, Rhinosinisutis: -Will change Flonase to Astelin and recommend to take OTC antihistamine medication.   - As part of my medical decision making, I reviewed the following data within the [place] History obtained from pt /family, CMA notes reviewed and incorporated if applicable, Labs reviewed, Radiograph/ tests reviewed if applicable and OV notes from prior OV's with me, as well as any other specialists she/he has seen since seeing me last, were all reviewed and used in my medical decision making process today.    - Additionally, when appropriate, discussion had with patient regarding our treatment plan, and their biases/concerns about that plan were used in my medical decision making today.    - The patient agreed with the plan and demonstrated an understanding of the instructions.   No barriers to understanding were identified.     - The patient was advised to call back or seek an in-person evaluation if the symptoms worsen or if the condition fails to improve as anticipated.   Return for HTN, Mood, Wt in 3-4 months.    No orders of the defined types were placed in this encounter.   Meds ordered this encounter  Medications  . azelastine (ASTELIN) 0.1 % nasal spray    Sig: Place 1 spray into both nostrils 2 (two) times daily. Use in each nostril as directed    Dispense:  30 mL    Refill:  1    Order Specific Question:   Supervising Provider    Answer:   Beatrice Lecher D [[id number]]    Medications Discontinued During This Encounter  Medication Reason  . varenicline (CHANTIX) 1 MG tablet Patient Preference       Time spent on visit including pre-visit chart review and post-visit care was 10 minutes.      The [place] was signed into law in 2016 which includes the topic of electronic health records.  This provides immediate access to information in MyChart.  This includes consultation notes, operative notes, office notes, lab results  and pathology reports.  If you have any questions about what you read please let us know at your next visit or call us at the office.  We are right here with you.   __________________________________________________________________________________     Patient Care Team    Relationship Specialty Notifications Start End  Eduardo [name], [place] PCP - General  All results, Admissions 02/18/20   Buford Dresser, MD PCP - Cardiology Cardiology Admissions 07/18/18   Allyn Kenner, MD  Dermatology  04/06/17   Charlett Blake, MD Consulting Physician Physical Medicine and Rehabilitation  11/11/17   Newt Minion, MD Consulting Physician Orthopedic Surgery  07/17/19   [name], MD Consulting Physician Physical Medicine and Rehabilitation  01/16/20   Chesley Mires, MD Consulting Physician Pulmonary Disease  01/16/20    Comment: OSA  [name], DPM Consulting Physician Podiatry  01/16/20  Tommy Medal, Lavell Islam, MD Consulting Physician Infectious Diseases  01/16/20    Comment: MRSA infection  Lavonna Monarch, MD Consulting Physician Dermatology  06/25/20      -Vitals obtained; medications/ allergies reconciled;  personal medical, social, Sx etc.histories were updated by CMA, reviewed by me and are reflected in chart   Patient Active Problem List   Diagnosis Date Noted  . Hypertriglyceridemia 04/12/2020  . Ulcer of great toe, right, with fat layer exposed ([place])   . Surgery, elective 05/16/2019  . Acute idiopathic gout of multiple sites 03/23/2019  . Recurrent infections 03/07/2019  . Blepharitis of right upper eyelid 03/07/2019  . Body mass index 40.0-44.9, adult ([place]) 04/19/2018  . Pain in left foot 04/19/2018  . Shortness of breath 04/07/2018  . Lower extremity edema 04/07/2018  . Stasis dermatitis of both legs 04/07/2018  . Hypokalemia 04/07/2018  . Healthcare maintenance 04/07/2018  . Great toe pain, left 03/08/2018  . Acute gout involving toe of left foot 03/08/2018  .  Low libido 02/09/2018  . OSA on CPAP 02/03/2018  . Status post nasal septoplasty 02/03/2018  . Chronic pansinusitis 12/07/2017  . Hypertrophy, nasal, turbinate 12/07/2017  . Deviated septum 11/11/2017  . Umbilical hernia without obstruction and without gangrene 11/11/2017  . Scrotal pain 11/11/2017  . Scrotal cyst 11/11/2017  . Screening, lipid 11/11/2017  . Screening for diabetes mellitus (DM) 11/11/2017  . Screening for thyroid disorder 11/11/2017  . Anxiety and depression 08/11/2017  . Acute maxillary sinusitis 08/11/2017  . 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 Lumbar degenerative disc disease 08/02/2017  . Acute left ankle pain 07/12/2017  . Skin infection 06/03/2017  . Chronic folliculitis 27/74/1287  . [place] 04/10/2017  . Cellulitis and abscess of trunk s/p I&D 04/10/2017 04/06/2017  . History of MRSA infection 04/06/2017  . Physical exam, annual 03/03/2017  . Neuropathy 09/28/2015  . Major depression 12/26/2012  . Generalized anxiety disorder 12/26/2012  . Opiate dependence ([place]) 12/24/2012  . Benzodiazepine dependence ([place]) 12/24/2012  . HTN (hypertension) 12/24/2012  . Pilonidal cyst 11/22/2012  . Morbid obesity ([place]) 09/09/2010  . TOBACCO ABUSE 09/09/2010  . LOW BACK PAIN, CHRONIC 09/09/2010     Current Meds  Medication Sig  . Acetaminophen (TYLENOL ARTHRITIS PAIN PO) Take 600 mg by mouth 3 (three) times daily.  [name] atorvastatin (LIPITOR) 10 MG tablet Take 1 tablet (10 mg total) by mouth daily.  . fluticasone (FLONASE) 50 MCG/ACT nasal spray 1 spray each nostril twice daily after sinus rinses  . montelukast (SINGULAIR) 10 MG tablet Take 1 tablet (10 mg total) by mouth at bedtime.  . Nutritional Supplements (JUICE PLUS FIBRE PO) Take 4 each by mouth daily with lunch.  . oxyCODONE-acetaminophen (PERCOCET) 7.5-325 MG tablet Take 1 tablet by mouth 5 (five) times daily as needed.  . pregabalin (LYRICA) 300 MG capsule Take 1 capsule (300 mg total) by  mouth 2 (two) times daily.  [name] sulfamethoxazole-trimethoprim (BACTRIM DS) 800-160 MG tablet Take 1 tablet by mouth 2 (two) times daily.     Allergies:  Allergies  Allergen Reactions  . Pollen Extract Cough  . Dust Mite Extract Other (See Comments)  . Other Other (See Comments)    Ragweed      ROS:  See above HPI for pertinent positives and negatives   Objective:   Height 6\' 5"  (1.956 m), weight (!) 355 lb (161 kg).  (if some vitals are omitted, this means that patient was UNABLE to obtain them even though they  were asked to get them prior to OV today.  They were asked to call us at their earliest convenience with these once obtained. ) General: A & O * 3; sounds in no acute distress; in usual state of health.  Respiratory: speaking in full sentences, no conversational dyspnea;  Psych: insight appears good, mood- appears full

## 2020-07-16 ENCOUNTER — Ambulatory Visit: Payer: BC Managed Care – PPO | Admitting: Cardiology

## 2020-07-21 ENCOUNTER — Encounter: Payer: Self-pay | Admitting: Dermatology

## 2020-07-21 NOTE — Progress Notes (Deleted)
Cardiology Office Note:    Date:  07/21/2020   ID:  Eduardo [name], DOB 09-Dec-1970, MRN [id number]  PCP:  Lorrene Reid, PA-C  Cardiologist:  Buford Dresser, MD  Electrophysiologist:  None   Referring MD: Lorrene Reid, PA-C   No chief complaint on file.   History of Present Illness:    Eduardo [name] is a 49 y.o. male with a hx of chronic pain, hypertension, anxiety/depression, tobacco use, upper extremity DVT, GERD, PE, OSA who presents for follow-up.  He was referred by Dr. [name] for evaluation of lower extremity edema and orthopnea, initially seen on 01/01/20. He was seen by Dr.[name] with the symptoms on 3/10.  Amlodipine use was suspected as the cause of his lower extremity edema and was discontinued.  He was previously seen by cardiology, Dr. [name], on 06/02/2018.  He had been referred for shortness of breath.  TTE was done on 06/13/2018, which showed no abnormalities.  He reports that he has been having lower extremity edema for about 1 year.  Recently stopped amlodipine and does report some improvement.  Has not been monitoring his blood pressure since he stopped amlodipine.  Also reports dyspnea, particularly when lying flat.  Reports intermittent chest tightness with exertion.  States only occurs with significant exertion, lasts less than a minute and resolves with rest.  Reports intermittent lightheadedness.  Denies any syncope or palpitations. Reports weight has been stable.  Smokes 1 pack/day, has smoked for 40 years.  No history of heart disease in his immediate family.  Coronary CTA showed nonobstructive CAD with calcified plaque causing mild (25 to 49%) stenosis in the mid LAD.  TTE on 01/17/2020 showed LVEF 70 to 83%, grade 1 diastolic dysfunction.  ABIs on 05/02/2020 were normal (right 1.24, left 1.19).  Since last clinic visit,   he reports his main issue has been leg pain.  States that can occur at rest or with exertion.  He continues to smoke 1  pack/day.  States that his chest pain has resolved.  Still feels short of breath and having lower extremity edema.  States that he is able to sleep lying flat.   Past Medical History:  Diagnosis Date  . Anxiety   . Chronic back pain   . Degenerative disk disease   . Depression   . DVT of upper extremity (deep vein thrombosis) ([place])   . GERD (gastroesophageal reflux disease)   . Hypertension   . MRSA (methicillin resistant staph aureus) culture positive   . PAD (peripheral artery disease) ([place])   . Peripheral neuropathy   . Pilonidal cyst   . Pulmonary embolus ([place])    no blood thinner 2002  . Sleep apnea    can't use cpap due to deviated nasal septumg    Past Surgical History:  Procedure Laterality Date  . Irrigation and debridement of back abscess    . MASS EXCISION Right 06/14/2019   Procedure: EXCISION BENIGN LESION RIGHT FOOT;  Surgeon: [name], DPM;  Location: [facility];  Service: Podiatry;  Laterality: Right;  . NASAL SEPTOPLASTY W/ TURBINOPLASTY [name] 02/03/2018   Procedure: NASAL SEPTOPLASTY WITH TURBINATE REDUCTION;  Surgeon: [name], MD;  Location: [place];  Service: ENT;  Laterality: [name];  . PILONIDAL CYST DRAINAGE [name] 04/10/2017   Procedure: IRRIGATION AND DEBRIDEMENT BACK ABSCESS;  Surgeon: [name], MD;  Location: [facility];  Service: General;  Laterality: [name];  . SINUS ENDO WITH FUSION [name] 02/03/2018   Procedure: ENDOSCOPIC SINUS SURGERY WITH FUSION;  Surgeon: [name], MD;  Location: [place];  Service: ENT;  Laterality: [name];  . THORACIC OUTLET SURGERY  2000  . TOE ARTHROPLASTY Right 06/14/2019   Procedure: HALLUX ARTHROPLASTY RIGHT FOOT WITH TISSUE TRANSER EXCISION OF SESAMOID BONE;  Surgeon: [name], DPM;  Location: [facility];  Service: Podiatry;  Laterality: Right;  . WRIST FUSION  2002    Current Medications: No outpatient medications have been marked as taking for the 07/22/20 encounter (Appointment) with Donato Heinz, MD.      Allergies:   Pollen extract, Dust mite extract, and Other   Social History   Socioeconomic History  . Marital status: Divorced    Spouse name: Not on file  . Number of children: 2  . Years of education: 12th   . Highest education level: Not on file  Occupational History  . Occupation: [name]  Tobacco Use  . Smoking status: Current Every Day Smoker    Packs/day: 0.25    Years: 33.00    Pack years: 8.25    Types: Cigarettes  . Smokeless tobacco: Never Used  . Tobacco comment: 06/28/19 1/4 pack per day  Vaping Use  . Vaping Use: Former  Substance and Sexual Activity  . Alcohol use: No    Alcohol/week: 0.0 standard drinks    Comment: Rarely  . Drug use: No  . Sexual activity: Yes    Birth control/protection: None  Other Topics Concern  . Not on file  Social History Narrative   Lives at home w/ his step-mother   Right-handed   Drinks about 2-3 [place] a day   Social Determinants of Health   Financial Resource Strain:   . Difficulty of Paying Living Expenses: Not on file  Food Insecurity:   . Worried About Charity fundraiser in the Last Year: Not on file  . Ran Out of Food in the Last Year: Not on file  Transportation Needs:   . Lack of Transportation (Medical): Not on file  . Lack of Transportation (Non-Medical): Not on file  Physical Activity:   . Days of Exercise per Week: Not on file  . Minutes of Exercise per Session: Not on file  Stress:   . Feeling of Stress : Not on file  Social Connections:   . Frequency of Communication with Friends and Family: Not on file  . Frequency of Social Gatherings with Friends and Family: Not on file  . Attends Religious Services: Not on file  . Active Member of Clubs or Organizations: Not on file  . Attends Archivist Meetings: Not on file  . Marital Status: Not on file     Family History: The patient's family history includes Cancer in his maternal grandmother; Diabetes in his mother; Heart disease in his father;  Hypertension in his father and mother; Stroke in his paternal grandfather. There is no history of Neuropathy.  ROS:   Please see the history of present illness.     All other systems reviewed and are negative.  EKGs/Labs/Other Studies Reviewed:    The following studies were reviewed today:   EKG:  EKG is ordered today.  The ekg ordered today demonstrates normal sinus rhythm, rate 75, no ST/T abnormalities  TTE 06/13/18: - Left ventricle: The cavity size was normal. There was mild  concentric hypertrophy. Systolic function was normal. The  estimated ejection fraction was in the range of 60% to 65%. Wall  motion was normal; there were no regional wall motion  abnormalities.  Left ventricular diastolic function parameters  were normal.  - Aortic valve: Transvalvular velocity was within the normal range.  There was no stenosis. There was no regurgitation.  - Mitral valve: Transvalvular velocity was within the normal range.  There was no evidence for stenosis. There was trivial  regurgitation.  - Right ventricle: The cavity size was normal. Wall thickness was  normal. Systolic function was normal.  - Atrial septum: No defect or patent foramen ovale was identified  by color flow Doppler.  - Tricuspid valve: There was trivial regurgitation.  - Pulmonary arteries: Systolic pressure was within the normal  range. PA peak pressure: 16 mm Hg (S).  - Global longitudinal strain -21.6% (normal).   Recent Labs: 02/23/2020: Hemoglobin 16.0; Platelets 181; TSH 1.680 04/12/2020: ALT 16; BNP 32.4; BUN 13; Creatinine, Ser 1.13; Potassium 4.3; Sodium 140  Recent Lipid Panel    Component Value Date/Time   CHOL 92 (L) 02/23/2020 0844   TRIG 299 (H) 02/23/2020 0844   HDL 19 (L) 02/23/2020 0844   CHOLHDL 4.8 02/23/2020 0844   CHOLHDL 4.5 10/25/2014 1124   VLDL 36 10/25/2014 1124   LDLCALC 28 02/23/2020 0844   LDLDIRECT 29 04/12/2020 0957    Physical Exam:    VS:  There were  no vitals taken for this visit.    Wt Readings from Last 3 Encounters:  07/11/20 (!) 355 lb (161 kg)  06/05/20 (!) 366 lb 6.4 oz (166.2 kg)  04/12/20 (!) 360 lb 1.6 oz (163.3 kg)     GEN: in no acute distress HEENT: Normal NECK: No JVD CARDIAC: RRR, no murmurs, rubs, gallops RESPIRATORY:  Clear to auscultation without rales, wheezing or rhonchi  ABDOMEN: Soft, non-tender, non-distended MUSCULOSKELETAL:  1+ edema; SKIN: Warm and dry NEUROLOGIC:  Alert and oriented x 3 PSYCHIATRIC:  Normal affect   ASSESSMENT:    No diagnosis found. PLAN:    CAD: Coronary CTA showed nonobstructive CAD with calcified plaque causing mild (25 to 49%) stenosis in the mid LAD.  Was reporting chest pain previously, but states symptoms have resolved -LDL 29, on atorvastatin 10 mg daily  Lower extremity edema/dyspnea: Normal systolic function on echo, but diastolic dysfunction could be contributing.  On as needed Lasix.  Hypertension: Stopped his amlodipine due to edema.  Currently appears normotensive off antihypertensives.  We will continue to monitor.  OSA: Has not been able to tolerate CPAP.  Compliance with CPAP strongly encouraged  Tobacco use: smokes 1ppd.  Cessation strongly encouraged.  Will refer to smoking cessation program  Leg pain: Normal ABIs  RTC in***   Medication Adjustments/Labs and Tests Ordered: Current medicines are reviewed at length with the patient today.  Concerns regarding medicines are outlined above.  No orders of the defined types were placed in this encounter.  No orders of the defined types were placed in this encounter.   There are no Patient Instructions on file for this visit.   Signed, Donato Heinz, MD  07/21/2020 8:05 PM    [place]

## 2020-07-21 NOTE — Progress Notes (Signed)
   New Patient   Subjective  Eduardo [name] is a 49 y.o. male who presents for the following: Establish Care and Annual Exam (skin check).  General skin examination Location:  Duration:  Quality:  Associated Signs/Symptoms: Modifying Factors:  Severity:  Timing: Context:    The following portions of the chart were reviewed this encounter and updated as appropriate: Tobacco  Allergies  Meds  Problems  Med Hx  Surg Hx  Fam Hx      Objective  Well appearing patient in no apparent distress; mood and affect are within normal limits.  All skin waist up examined.  Plus legs.   Assessment & Plan  Dermatofibroma (2) Left Lower Leg - Anterior; Right Lower Leg - Posterior  Given the choice to leave if stable will obtain confirmatory biopsy.  He will think this over  Skin exam for malignant neoplasm Mid Back  Annual skin examination First visit for [name] date of birth [birth year].  General skin examination of legs and all the skin from the waist up showed no atypical moles, melanoma, or nonmole skin cancer.  He has some small sebaceous hyperplasia on his forehead which is safe to ignore if stable, and multiple skin tags under the arm more than on the neck or the leg crease.  He has a large scar across his left mid back where a MRSA infection was drained and history of pilonidal cysts with some recurrence and scarring in this area.  He was concerned about two dark nodules on his legs, 1 left inner shin and the other on the right calf.  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  3 options given to [name]: #1 leave them if they are stable; #2 simple shave biopsy to be positive they are benign; and #3 schedule an hour to have excision done (unfortunately this will be an area of poor healing).  He will think this over and if everything is stable I will plan on doing a general skin check in 1  year. Skin cancer screening performed today.

## 2020-07-22 ENCOUNTER — Ambulatory Visit: Payer: BC Managed Care – PPO | Admitting: Cardiology

## 2020-07-31 ENCOUNTER — Encounter: Payer: Self-pay | Admitting: Physical Medicine and Rehabilitation

## 2020-07-31 ENCOUNTER — Encounter
Payer: BC Managed Care – PPO | Attending: Physical Medicine & Rehabilitation | Admitting: Physical Medicine and Rehabilitation

## 2020-07-31 ENCOUNTER — Other Ambulatory Visit: Payer: Self-pay

## 2020-07-31 VITALS — BP 128/75 | HR 80 | Temp 98.7°F | Ht 77.0 in | Wt 355.0 lb

## 2020-07-31 DIAGNOSIS — M7551 Bursitis of right shoulder: Secondary | ICD-10-CM

## 2020-07-31 DIAGNOSIS — G609 Hereditary and idiopathic neuropathy, unspecified: Secondary | ICD-10-CM | POA: Insufficient documentation

## 2020-07-31 DIAGNOSIS — Z5181 Encounter for therapeutic drug level monitoring: Secondary | ICD-10-CM

## 2020-07-31 DIAGNOSIS — M7552 Bursitis of left shoulder: Secondary | ICD-10-CM

## 2020-07-31 DIAGNOSIS — G894 Chronic pain syndrome: Secondary | ICD-10-CM | POA: Diagnosis present

## 2020-07-31 DIAGNOSIS — Z79891 Long term (current) use of opiate analgesic: Secondary | ICD-10-CM | POA: Diagnosis not present

## 2020-07-31 MED ORDER — PREGABALIN 300 MG PO CAPS: 300.0000 mg | ORAL_CAPSULE | Freq: Two times a day (BID) | ORAL | 2 refills | Status: DC

## 2020-07-31 MED ORDER — OXYCODONE-ACETAMINOPHEN 7.5-325 MG PO TABS
1.0000 | ORAL_TABLET | Freq: Every day | ORAL | 0 refills | Status: DC | PRN
Start: 2020-07-31 — End: 2020-10-02

## 2020-07-31 NOTE — Progress Notes (Signed)
Subjective:    Patient ID: Eduardo [name], male    DOB: 06/08/1971, 49 y.o.   MRN: [id number]  HPI  Mr. Causer is a 49 year old man who presents for follow-up of his bilateral ankle, knee pain.   Since last visit he moved and that was quite a strain. He hurt his left shoulder on the 20th of last month. It is not getting better. He took a legal variety of CBD and was worried if this would come in his sample.   He has using ice pads on his ankle and knees. The Percocet has been helping.   BP 128/75 this visit!  He lost 12 lbs over the past 4 months. He has been drinking less sodas. He still drinks about three per day.   He looks more energetic and vibrant!     Pain Inventory Average Pain 8 Pain Right Now 9 My pain is constant, sharp, burning, dull, stabbing, tingling and aching  In the last 24 hours, has pain interfered with the following? General activity 10 Relation with others 9 Enjoyment of life 10 What TIME of day is your pain at its worst? morning , daytime, evening and night Sleep (in general) Poor  Pain is worse with: walking, bending, sitting, standing and some activites Pain improves with: rest and medication Relief from Meds: 7  Family History  Problem Relation Age of Onset  . Cancer Maternal Grandmother        breast, ovarian & colon  . Hypertension Mother   . Diabetes Mother   . Hypertension Father   . Heart disease Father   . Stroke Paternal Grandfather   . Neuropathy Neg Hx    Social History   Socioeconomic History  . Marital status: Divorced    Spouse name: Not on file  . Number of children: 2  . Years of education: 12th   . Highest education level: Not on file  Occupational History  . Occupation: [name]  Tobacco Use  . Smoking status: Current Every Day Smoker    Packs/day: 0.25    Years: 33.00    Pack years: 8.25    Types: Cigarettes  . Smokeless tobacco: Never Used  . Tobacco comment: 06/28/19 1/4 pack per day  Vaping Use  . Vaping  Use: Former  Substance and Sexual Activity  . Alcohol use: No    Alcohol/week: 0.0 standard drinks    Comment: Rarely  . Drug use: No  . Sexual activity: Yes    Birth control/protection: None  Other Topics Concern  . Not on file  Social History Narrative   Lives at home w/ his step-mother   Right-handed   Drinks about 2-3 [place] a day   Social Determinants of Health   Financial Resource Strain:   . Difficulty of Paying Living Expenses: Not on file  Food Insecurity:   . Worried About Charity fundraiser in the Last Year: Not on file  . Ran Out of Food in the Last Year: Not on file  Transportation Needs:   . Lack of Transportation (Medical): Not on file  . Lack of Transportation (Non-Medical): Not on file  Physical Activity:   . Days of Exercise per Week: Not on file  . Minutes of Exercise per Session: Not on file  Stress:   . Feeling of Stress : Not on file  Social Connections:   . Frequency of Communication with Friends and Family: Not on file  . Frequency of Social Gatherings  with Friends and Family: Not on file  . Attends Religious Services: Not on file  . Active Member of Clubs or Organizations: Not on file  . Attends Archivist Meetings: Not on file  . Marital Status: Not on file   Past Surgical History:  Procedure Laterality Date  . Irrigation and debridement of back abscess    . MASS EXCISION Right 06/14/2019   Procedure: EXCISION BENIGN LESION RIGHT FOOT;  Surgeon: [name], DPM;  Location: [facility];  Service: Podiatry;  Laterality: Right;  . NASAL SEPTOPLASTY W/ TURBINOPLASTY [name] 02/03/2018   Procedure: NASAL SEPTOPLASTY WITH TURBINATE REDUCTION;  Surgeon: [name], MD;  Location: [facility];  Service: ENT;  Laterality: [name];  . PILONIDAL CYST DRAINAGE [name] 04/10/2017   Procedure: IRRIGATION AND DEBRIDEMENT BACK ABSCESS;  Surgeon: [name], MD;  Location: [facility];  Service: General;  Laterality: [name];  . SINUS ENDO WITH FUSION [name] 02/03/2018    Procedure: ENDOSCOPIC SINUS SURGERY WITH FUSION;  Surgeon: [name], MD;  Location: [facility];  Service: ENT;  Laterality: [name];  . THORACIC OUTLET SURGERY  2000  . TOE ARTHROPLASTY Right 06/14/2019   Procedure: HALLUX ARTHROPLASTY RIGHT FOOT WITH TISSUE TRANSER EXCISION OF SESAMOID BONE;  Surgeon: [name], DPM;  [facility];  Service: Podiatry;  Laterality: Right;  . WRIST FUSION  2002   Past Surgical History:  Procedure Laterality Date  . Irrigation and debridement of back abscess    . MASS EXCISION Right 06/14/2019   Procedure: EXCISION BENIGN LESION RIGHT FOOT;  Surgeon: [name], DPM;  [facility];  Service: Podiatry;  Laterality: Right;  . NASAL SEPTOPLASTY W/ TURBINOPLASTY [name] 02/03/2018   Procedure: NASAL SEPTOPLASTY WITH TURBINATE REDUCTION;  Surgeon: [name], MD;  Location: [facility];  Service: ENT;  Laterality: [name];  . PILONIDAL CYST DRAINAGE [name] 04/10/2017   Procedure: IRRIGATION AND DEBRIDEMENT BACK ABSCESS;  Surgeon: [name], MD;  [facility];  Service: General;  Laterality: [name];  . SINUS ENDO WITH FUSION [name] 02/03/2018   Procedure: ENDOSCOPIC SINUS SURGERY WITH FUSION;  Surgeon: [name], MD;  Location: [facility];  Service: ENT;  Laterality: [name];  . THORACIC OUTLET SURGERY  2000  . TOE ARTHROPLASTY Right 06/14/2019   Procedure: HALLUX ARTHROPLASTY RIGHT FOOT WITH TISSUE TRANSER EXCISION OF SESAMOID BONE;  Surgeon: [name], DPM;  [facility];  Service: Podiatry;  Laterality: Right;  . WRIST FUSION  2002   Past Medical History:  Diagnosis Date  . Anxiety   . Chronic back pain   . Degenerative disk disease   . Depression   . DVT of upper extremity (deep vein thrombosis) ([place])   . GERD (gastroesophageal reflux disease)   . Hypertension   . MRSA (methicillin resistant staph aureus) culture positive   . PAD (peripheral artery disease) ([place])   . Peripheral neuropathy   . Pilonidal cyst   . Pulmonary embolus ([place])    no blood  thinner 2002  . Sleep apnea    can't use cpap due to deviated nasal septumg   There were no vitals taken for this visit.  Opioid Risk Score:   Fall Risk Score:  `1  Depression screen PHQ 2/9  Depression screen [facility] 2/9 07/11/2020 06/05/2020 04/12/2020 04/05/2020 03/13/2020 02/27/2020 01/23/2020  Decreased Interest 0 1 2 1 1 1 1   Down, Depressed, Hopeless 0 1 1 1 1  0 1  PHQ - 2 Score 0 2 3 2 2  1  2  Altered sleeping 3 - 2 - - 3 -  Tired, decreased energy 3 - 3 - - 3 -  Change in appetite 1 - 3 - - 2 -  Feeling bad or failure about yourself  0 - 0 - - 0 -  Trouble concentrating 3 - 3 - - 3 -  Moving slowly or fidgety/restless 3 - 1 - - 0 -  Suicidal thoughts 0 - 0 - - 0 -  PHQ-9 Score 13 - 15 - - 12 -  Difficult doing work/chores - - Very difficult - - Very difficult -  Some recent data might be hidden   Review of Systems  Constitutional: Negative.   HENT: Negative.   Eyes: Negative.   Respiratory: Negative.   Cardiovascular: Positive for leg swelling.  Gastrointestinal: Negative.   Endocrine: Negative.   Genitourinary: Negative.   Musculoskeletal: Positive for back pain and gait problem.       Spasms  Skin: Negative.   Allergic/Immunologic: Negative.   Neurological: Positive for weakness and numbness.       Tingling  Hematological: Negative.   Psychiatric/Behavioral: Positive for dysphoric mood. The patient is nervous/anxious.   All other systems reviewed and are negative.      Objective:   Physical Exam Gen: no distress, normal appearing HEENT: oral mucosa pink and moist, NCAT Cardio: Reg rate Chest: normal effort, normal rate of breathing Abd: soft, non-distended Ext: no edema Skin: intact Neuro: Alert and oriented x3 Musculoskeletal: Bilateral lower extremities edematous and tender to palpation at bilateral knees and ankles Range of motion decreased to 60 degrees in abduction and elevation of left arm. Psych: pleasant, normal affect    Assessment & Plan:   1.Chronic idiopathic axonal polyneuropathy: Continue Lyrica. Refilled today   2. Posterior tibial tendinitis of left lower extremity: Continue HEP as Tolerated. Continue to Monitor.  3. Lumbar DDD/ Lumbar Radiculitis: Continue current medication regime and HEP as Tolerated.  4. Chronic Pain Syndrome: Refilled Percocet 7.5/325mg one tablet5times daily as needed for pain. This has resulted in improved pain control, especially when he has to work 10 hour long days. Refilled today.   5. Obesity: Discussed good exercise and nutrition habits. Current weight is 355 lbs, down 12 lbs in the past 4 months! Made goal to decrease soda to down to twice per day.   6. Insomnia: Benefited from Amitriptyline 10mg  in the past. Does not require anymore- sleeping well!  7. Pilonidal cyst: Reviewed ID note. Recommended ID follow-up. Co-pay is expensive for patient and is the reason he could not follow-up. He cannot pursue surgery at this time due to cost. Patient has been taking medication for 9 months and tolerating it well. Discussed side effects of long term use of antibiotics. Prescribed DS Bactrim as previously ordered by ID. Plans to follow-up with ID by the end of the year.   8) BP reviewed and better controlled this visit!  9) Left sided adhesive capsulitis: If persists next visit, can perform ultrasound guided steroid injection next visit.   All questions were encouraged and answered. Follow up with me in 2 months.

## 2020-08-03 LAB — DRUG TOX MONITOR 1 W/CONF, ORAL FLD
Amphetamines: NEGATIVE ng/mL (ref <10)
Barbiturates: NEGATIVE ng/mL (ref <10)
Benzodiazepines: NEGATIVE ng/mL (ref <0.50)
Buprenorphine: NEGATIVE ng/mL (ref <0.10)
Cocaine: NEGATIVE ng/mL (ref <5.0)
Codeine: NEGATIVE ng/mL (ref <2.5)
Cotinine: 98.1 ng/mL — ABNORMAL HIGH (ref <5.0)
Dihydrocodeine: NEGATIVE ng/mL (ref <2.5)
Fentanyl: NEGATIVE ng/mL (ref <0.10)
Heroin Metabolite: NEGATIVE ng/mL (ref <1.0)
Hydrocodone: NEGATIVE ng/mL (ref <2.5)
Hydromorphone: NEGATIVE ng/mL (ref <2.5)
MARIJUANA: NEGATIVE ng/mL (ref <2.5)
MDMA: NEGATIVE ng/mL (ref <10)
Meprobamate: NEGATIVE ng/mL (ref <2.5)
Methadone: NEGATIVE ng/mL (ref <5.0)
Morphine: NEGATIVE ng/mL (ref <2.5)
Nicotine Metabolite: POSITIVE ng/mL — AB (ref <5.0)
Norhydrocodone: NEGATIVE ng/mL (ref <2.5)
Noroxycodone: 22.4 ng/mL — ABNORMAL HIGH (ref <2.5)
Opiates: POSITIVE ng/mL — AB (ref <2.5)
Oxycodone: >250 ng/mL — ABNORMAL HIGH (ref <2.5)
Oxymorphone: NEGATIVE ng/mL (ref <2.5)
Phencyclidine: NEGATIVE ng/mL (ref <10)
Tapentadol: NEGATIVE ng/mL (ref <5.0)
Tramadol: NEGATIVE ng/mL (ref <5.0)
Zolpidem: NEGATIVE ng/mL (ref <5.0)

## 2020-08-03 LAB — DRUG TOX ALC METAB W/CON, ORAL FLD: Alcohol Metabolite: NEGATIVE ng/mL (ref <25)

## 2020-08-06 ENCOUNTER — Telehealth: Payer: Self-pay | Admitting: *Deleted

## 2020-08-06 NOTE — Telephone Encounter (Signed)
Oral swab drug screen was consistent for prescribed medications.  ?

## 2020-08-07 ENCOUNTER — Telehealth: Payer: Self-pay | Admitting: Physician Assistant

## 2020-08-07 DIAGNOSIS — J3089 Other allergic rhinitis: Secondary | ICD-10-CM

## 2020-08-07 DIAGNOSIS — J302 Other seasonal allergic rhinitis: Secondary | ICD-10-CM

## 2020-08-07 MED ORDER — MONTELUKAST SODIUM 10 MG PO TABS: 10.0000 mg | ORAL_TABLET | Freq: Every day | ORAL | 0 refills | Status: DC

## 2020-08-07 NOTE — Telephone Encounter (Signed)
Patient called in needing a refill on Singulair. Please send to [facility]. He is a former Dr. Jenetta Downer patient. Thanks

## 2020-08-07 NOTE — Addendum Note (Signed)
Addended by: Fonnie Mu on: 08/07/2020 02:14 PM   Modules accepted: Orders

## 2020-08-12 ENCOUNTER — Telehealth: Payer: Self-pay | Admitting: Physician Assistant

## 2020-08-12 NOTE — Telephone Encounter (Signed)
08/12/2020  Pt informed that physician's name was Dr. Chesley Mires.  Pt expressed understanding.  Charyl Bigger, CMA

## 2020-08-12 NOTE — Telephone Encounter (Signed)
Patient would like to know the name of the doctor who performed his most recent sleep study. I found one from November of 2020 but was not sure if it was the most recent. Thanks.

## 2020-08-13 ENCOUNTER — Telehealth: Payer: Self-pay

## 2020-08-13 NOTE — Telephone Encounter (Signed)
Patient called stating a shoulder injection was mention in his last visit and he is calling requesting shoulder injection. Is this okay to schedule and what to schedule?

## 2020-08-13 NOTE — Telephone Encounter (Signed)
Yes, please schedule for shoulder corticosteroid injection for whenever patient would like! Can be at follow-up or earlier if he prefers

## 2020-08-13 NOTE — Telephone Encounter (Signed)
Scheduled

## 2020-08-16 ENCOUNTER — Other Ambulatory Visit: Payer: Self-pay

## 2020-08-16 ENCOUNTER — Ambulatory Visit: Payer: Self-pay

## 2020-08-16 ENCOUNTER — Ambulatory Visit (INDEPENDENT_AMBULATORY_CARE_PROVIDER_SITE_OTHER): Payer: BC Managed Care – PPO | Admitting: Orthopedic Surgery

## 2020-08-16 ENCOUNTER — Encounter: Payer: Self-pay | Admitting: Orthopedic Surgery

## 2020-08-16 DIAGNOSIS — M25512 Pain in left shoulder: Secondary | ICD-10-CM

## 2020-08-16 NOTE — Progress Notes (Signed)
Office Visit Note   Patient: Eduardo [name]           Date of Birth: 12/15/1970           MRN: [id number] Visit Date: 08/16/2020 Requested by: Lorrene Reid, PA-C [place],  [place] [address] PCP: Lorrene Reid, PA-C  Subjective: Chief Complaint  Patient presents with  . Left Shoulder - Pain    HPI: Eduardo [name] is a 49 y.o. male who presents to the office complaining of left shoulder pain.  Patient notes left shoulder pain for about a month following an injury where he was moving furniture and lifting a mattress that weighed over 100 pounds when he felt a pop in his left shoulder and his arm gave out.  He has no history of a previous shoulder injury.  He notes pain and weakness of the left shoulder since the injury.  He describes the pain as a sharp pain in the anterior lateral aspect of the shoulder.  He has radiation down the anterior aspect of his arm.  He denies any neck pain or shoulder blade pain.  No history of previous shoulder surgery or dislocation.  He does have a history of neuropathy for which he is in pain management and takes Lyrica and Percocet.  He states that this medication is not helping with his current shoulder pain.  He works as a Dealer..                ROS: All systems reviewed are negative as they relate to the chief complaint within the history of present illness.  Patient denies fevers or chills.  Assessment & Plan: Visit Diagnoses:  1. Acute pain of left shoulder     Plan: Patient is a 49 year old male who presents complaining of left shoulder pain following an injury where he was lifting a heavy piece of furniture and felt a pop in his shoulder.  He has had persistent pain and weakness since the event.  Pain bothers him more than weakness.  On exam he has no true weakness of the rotator cuff but he does have significant pain and some crepitus that is suspicious for possible rotator cuff tear.  With the severity of  patient's pain, plan to order MRI arthrogram of the left shoulder for evaluation of SLAP tear versus rotator cuff tear.  Patient agreed with this plan.  Follow-up after MRI to review results.  Follow-Up Instructions: No follow-ups on file.   Orders:  Orders Placed This Encounter  Procedures  . XR Shoulder Left  . MR Shoulder Left w/ contrast  . Arthrogram   No orders of the defined types were placed in this encounter.     Procedures: No procedures performed   Clinical Data: No additional findings.  Objective: Vital Signs: There were no vitals taken for this visit.  Physical Exam:  Constitutional: Patient appears well-developed HEENT:  Head: Normocephalic Eyes:EOM are normal Neck: Normal range of motion Cardiovascular: Normal rate Pulmonary/chest: Effort normal Neurologic: Patient is alert Skin: Skin is warm Psychiatric: Patient has normal mood and affect  Ortho Exam: Ortho exam demonstrates left shoulder with 60 degrees external rotation, 95 degrees abduction, 170 degrees forward flexion which is mildly reduced compared with contralateral side.  There is some anterior crepitus that is felt with passive range of motion of the shoulder.  Tenderness palpation over the bicipital groove.  Positive Hawkins and Neer's impingement signs.  5 -/5 strength of the supraspinatus and  subscapularis.  5/5 motor strength of the infraspinatus.  5/5 motor strength of the bilateral grip, finger abduction, pronation/supination, bicep, tricep.  No tenderness throughout the axial cervical spine.  Negative Spurling sign bilaterally.  Excellent cervical spine range of motion without pain.  Specialty Comments:  No specialty comments available.  Imaging: No results found.   PMFS History: Patient Active Problem List   Diagnosis Date Noted  . Hypertriglyceridemia 04/12/2020  . Ulcer of great toe, right, with fat layer exposed ([place])   . Surgery, elective 05/16/2019  . Acute idiopathic gout of  multiple sites 03/23/2019  . Recurrent infections 03/07/2019  . Blepharitis of right upper eyelid 03/07/2019  . Body mass index 40.0-44.9, adult ([place]) 04/19/2018  . Pain in left foot 04/19/2018  . Shortness of breath 04/07/2018  . Lower extremity edema 04/07/2018  . Stasis dermatitis of both legs 04/07/2018  . Hypokalemia 04/07/2018  . Healthcare maintenance 04/07/2018  . Great toe pain, left 03/08/2018  . Acute gout involving toe of left foot 03/08/2018  . Low libido 02/09/2018  . OSA on CPAP 02/03/2018  . Status post nasal septoplasty 02/03/2018  . Chronic pansinusitis 12/07/2017  . Hypertrophy, nasal, turbinate 12/07/2017  . Deviated septum 11/11/2017  . Umbilical hernia without obstruction and without gangrene 11/11/2017  . Scrotal pain 11/11/2017  . Scrotal cyst 11/11/2017  . Screening, lipid 11/11/2017  . Screening for diabetes mellitus (DM) 11/11/2017  . Screening for thyroid disorder 11/11/2017  . Anxiety and depression 08/11/2017  . Acute maxillary sinusitis 08/11/2017  . 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 Lumbar degenerative disc disease 08/02/2017  . Acute left ankle pain 07/12/2017  . Skin infection 06/03/2017  . Chronic folliculitis 32/95/1884  . [place] 04/10/2017  . Cellulitis and abscess of trunk s/p I&D 04/10/2017 04/06/2017  . History of MRSA infection 04/06/2017  . Physical exam, annual 03/03/2017  . Neuropathy 09/28/2015  . Major depression 12/26/2012  . Generalized anxiety disorder 12/26/2012  . Opiate dependence ([place]) 12/24/2012  . Benzodiazepine dependence ([place]) 12/24/2012  . HTN (hypertension) 12/24/2012  . Pilonidal cyst 11/22/2012  . Morbid obesity ([place]) 09/09/2010  . TOBACCO ABUSE 09/09/2010  . LOW BACK PAIN, CHRONIC 09/09/2010   Past Medical History:  Diagnosis Date  . Anxiety   . Chronic back pain   . Degenerative disk disease   . Depression   . DVT of upper extremity (deep vein thrombosis) ([place])   . GERD  (gastroesophageal reflux disease)   . Hypertension   . MRSA (methicillin resistant staph aureus) culture positive   . PAD (peripheral artery disease) ([place])   . Peripheral neuropathy   . Pilonidal cyst   . Pulmonary embolus ([place])    no blood thinner 2002  . Sleep apnea    can't use cpap due to deviated nasal septumg    Family History  Problem Relation Age of Onset  . Cancer Maternal Grandmother        breast, ovarian & colon  . Hypertension Mother   . Diabetes Mother   . Hypertension Father   . Heart disease Father   . Stroke Paternal Grandfather   . Neuropathy Neg Hx     Past Surgical History:  Procedure Laterality Date  . Irrigation and debridement of back abscess    . MASS EXCISION Right 06/14/2019   Procedure: EXCISION BENIGN LESION RIGHT FOOT;  Surgeon: [name], DPM;  [facility];  Service: Podiatry;  Laterality: Right;  . NASAL SEPTOPLASTY W/ TURBINOPLASTY [name] 02/03/2018  Procedure: NASAL SEPTOPLASTY WITH TURBINATE REDUCTION;  Surgeon: [name], MD;  Location: [facility];  Service: ENT;  Laterality: [name];  . PILONIDAL CYST DRAINAGE [name] 04/10/2017   Procedure: IRRIGATION AND DEBRIDEMENT BACK ABSCESS;  Surgeon: [name], MD;  [facility];  Service: General;  Laterality: [name];  . SINUS ENDO WITH FUSION [name] 02/03/2018   Procedure: ENDOSCOPIC SINUS SURGERY WITH FUSION;  Surgeon: [name], MD;  Location: [facility];  Service: ENT;  Laterality: [name];  . THORACIC OUTLET SURGERY  2000  . TOE ARTHROPLASTY Right 06/14/2019   Procedure: HALLUX ARTHROPLASTY RIGHT FOOT WITH TISSUE TRANSER EXCISION OF SESAMOID BONE;  Surgeon: [name], DPM;  [facility];  Service: Podiatry;  Laterality: Right;  . WRIST FUSION  2002   Social History   Occupational History  . Occupation: [name]  Tobacco Use  . Smoking status: Current Every Day Smoker    Packs/day: 0.25    Years: 33.00    Pack years: 8.25    Types: Cigarettes  . Smokeless tobacco: Never Used  . Tobacco  comment: 06/28/19 1/4 pack per day  Vaping Use  . Vaping Use: Former  Substance and Sexual Activity  . Alcohol use: No    Alcohol/week: 0.0 standard drinks    Comment: Rarely  . Drug use: No  . Sexual activity: Yes    Birth control/protection: None

## 2020-08-19 ENCOUNTER — Telehealth: Payer: Self-pay | Admitting: Pulmonary Disease

## 2020-08-19 NOTE — Telephone Encounter (Signed)
Pt's sleep study results have been faxed at provided fax number by Caryl Pina at Dr. Corky Sing office. Nothing further needed.

## 2020-09-04 ENCOUNTER — Encounter: Payer: BC Managed Care – PPO | Admitting: Physical Medicine and Rehabilitation

## 2020-09-06 ENCOUNTER — Other Ambulatory Visit: Payer: BC Managed Care – PPO

## 2020-09-10 ENCOUNTER — Ambulatory Visit: Payer: BC Managed Care – PPO | Admitting: Psychology

## 2020-09-30 ENCOUNTER — Other Ambulatory Visit: Payer: Self-pay | Admitting: Adult Health

## 2020-09-30 DIAGNOSIS — J302 Other seasonal allergic rhinitis: Secondary | ICD-10-CM

## 2020-09-30 DIAGNOSIS — J3089 Other allergic rhinitis: Secondary | ICD-10-CM

## 2020-10-02 ENCOUNTER — Encounter: Payer: Self-pay | Admitting: Physical Medicine and Rehabilitation

## 2020-10-02 ENCOUNTER — Encounter
Payer: BC Managed Care – PPO | Attending: Physical Medicine & Rehabilitation | Admitting: Physical Medicine and Rehabilitation

## 2020-10-02 ENCOUNTER — Other Ambulatory Visit: Payer: Self-pay

## 2020-10-02 VITALS — BP 160/92 | HR 79 | Temp 98.6°F | Ht 77.0 in | Wt 253.0 lb

## 2020-10-02 DIAGNOSIS — M7551 Bursitis of right shoulder: Secondary | ICD-10-CM | POA: Insufficient documentation

## 2020-10-02 DIAGNOSIS — M7552 Bursitis of left shoulder: Secondary | ICD-10-CM | POA: Insufficient documentation

## 2020-10-02 DIAGNOSIS — G894 Chronic pain syndrome: Secondary | ICD-10-CM | POA: Diagnosis not present

## 2020-10-02 MED ORDER — OXYCODONE-ACETAMINOPHEN 7.5-325 MG PO TABS
1.0000 | ORAL_TABLET | Freq: Every day | ORAL | 0 refills | Status: DC | PRN
Start: 2020-10-02 — End: 2020-12-02

## 2020-10-02 NOTE — Addendum Note (Signed)
Addended by: [name] on: 10/02/2020 03:21 PM   Modules accepted: Orders

## 2020-10-02 NOTE — Progress Notes (Signed)
Shoulder injection, left  Indication:Left Shoulder pain not relieved by medication management and other conservative care.  Informed consent was obtained after describing risks and benefits of the procedure with the patient, this includes bleeding, bruising, infection and medication side effects. The patient wishes to proceed and has given written consent. Patient was placed in a seated position. The left shoulder was marked and prepped with betadine in the subacromial area. A 25-gauge 1-1/2 inch needle was inserted into the subacromial area. After negative draw back for blood, a solution containing 1 mL of triamcinolone and 4 mL of 1% lidocaine was injected. A band aid was applied. The patient tolerated the procedure well. Post procedure instructions were given.

## 2020-10-04 ENCOUNTER — Ambulatory Visit: Payer: BC Managed Care – PPO | Admitting: Physical Medicine and Rehabilitation

## 2020-11-21 ENCOUNTER — Telehealth: Payer: Self-pay | Admitting: Physical Medicine and Rehabilitation

## 2020-11-21 NOTE — Telephone Encounter (Signed)
He can be scheduled 3/15 or after for shoulder steroid injection! Thank you!

## 2020-11-21 NOTE — Telephone Encounter (Signed)
We have to wait 3 months in between steroid injections as too frequent steroid injections can damage bone and cartilage in the area. Please let him know until then he can continue his current medication and try applying blue emu oil to his shoulder (he can get over the counter). Thank you April!

## 2020-11-21 NOTE — Telephone Encounter (Signed)
I made the patient an appointment for March 16 and he wanted to know why he couldn't have one done sooner, he is hurting so bad.  What else could he do for the pain?

## 2020-11-21 NOTE — Telephone Encounter (Signed)
Patient is calling to request another shoulder injection.  Can we schedule?  Please advise.  Thank you.

## 2020-12-02 ENCOUNTER — Other Ambulatory Visit: Payer: Self-pay

## 2020-12-02 ENCOUNTER — Other Ambulatory Visit: Payer: Self-pay | Admitting: Physical Medicine and Rehabilitation

## 2020-12-02 DIAGNOSIS — G8929 Other chronic pain: Secondary | ICD-10-CM

## 2020-12-02 DIAGNOSIS — M25562 Pain in left knee: Secondary | ICD-10-CM

## 2020-12-02 MED ORDER — OXYCODONE-ACETAMINOPHEN 7.5-325 MG PO TABS: 1.0000 | ORAL_TABLET | Freq: Every day | ORAL | 0 refills | Status: DC | PRN

## 2020-12-04 ENCOUNTER — Other Ambulatory Visit: Payer: Self-pay

## 2020-12-04 ENCOUNTER — Encounter
Payer: BC Managed Care – PPO | Attending: Physical Medicine & Rehabilitation | Admitting: Physical Medicine and Rehabilitation

## 2020-12-04 ENCOUNTER — Encounter: Payer: Self-pay | Admitting: Physical Medicine and Rehabilitation

## 2020-12-04 VITALS — BP 147/85 | HR 82 | Temp 98.7°F | Ht 77.0 in | Wt 361.0 lb

## 2020-12-04 DIAGNOSIS — Z5181 Encounter for therapeutic drug level monitoring: Secondary | ICD-10-CM | POA: Diagnosis not present

## 2020-12-04 DIAGNOSIS — G4701 Insomnia due to medical condition: Secondary | ICD-10-CM

## 2020-12-04 DIAGNOSIS — E66813 Obesity, class 3: Secondary | ICD-10-CM

## 2020-12-04 DIAGNOSIS — Z79891 Long term (current) use of opiate analgesic: Secondary | ICD-10-CM | POA: Diagnosis not present

## 2020-12-04 DIAGNOSIS — G894 Chronic pain syndrome: Secondary | ICD-10-CM

## 2020-12-04 DIAGNOSIS — L0591 Pilonidal cyst without abscess: Secondary | ICD-10-CM | POA: Diagnosis present

## 2020-12-04 DIAGNOSIS — Z6841 Body Mass Index (BMI) 40.0 and over, adult: Secondary | ICD-10-CM | POA: Diagnosis present

## 2020-12-04 NOTE — Progress Notes (Signed)
Subjective:    Patient ID: Eduardo [name], male    DOB: Feb 08, 1971, 50 y.o.   MRN: [id number]  HPI  Mr. Heslop is a 50 year old man who presents for follow-up of his bilateral ankle, knee pain.   1) Chronic pain syndrome:  -His pain has been worsened since last visit given his uncle's death.    2) Shoulder pain, left: -he is unable to raise the shoulder above this head.  -He does not want   3) Obesity:  -Has gained 6 lbs since last visit -He has been cooking and with his brother.  -He has been doing well with sugar.   4) Insomnia:  -He has been sleeping well.   Prior history: Since last visit he moved and that was quite a strain. He hurt his left shoulder on the 20th of last month. It is not getting better. He took a legal variety of CBD and was worried if this would come in his sample.   He has using ice pads on his ankle and knees. The Percocet has been helping.   BP 128/75 this visit!  He lost 12 lbs over the past 4 months. He has been drinking less sodas. He still drinks about three per day.   He looks more energetic and vibrant!     Pain Inventory Average Pain 8 Pain Right Now 8 My pain is constant, sharp, dull, stabbing, tingling and aching  In the last 24 hours, has pain interfered with the following? General activity 9 Relation with others 8 Enjoyment of life 10 What TIME of day is your pain at its worst? morning , daytime, evening and night Sleep (in general) Poor  Pain is worse with: walking, bending, sitting, standing and some activites Pain improves with: rest and medication Relief from Meds: 8  Family History  Problem Relation Age of Onset  . Cancer Maternal Grandmother        breast, ovarian & colon  . Hypertension Mother   . Diabetes Mother   . Hypertension Father   . Heart disease Father   . Stroke Paternal Grandfather   . Neuropathy Neg Hx    Social History   Socioeconomic History  . Marital status: Divorced    Spouse name:  Not on file  . Number of children: 2  . Years of education: 12th   . Highest education level: Not on file  Occupational History  . Occupation: [name]  Tobacco Use  . Smoking status: Current Every Day Smoker    Packs/day: 0.25    Years: 33.00    Pack years: 8.25    Types: Cigarettes  . Smokeless tobacco: Never Used  . Tobacco comment: 06/28/19 1/4 pack per day  Vaping Use  . Vaping Use: Former  . Substances: CBD  Substance and Sexual Activity  . Alcohol use: No    Alcohol/week: 0.0 standard drinks    Comment: Rarely  . Drug use: No  . Sexual activity: Yes    Birth control/protection: None  Other Topics Concern  . Not on file  Social History Narrative   Lives at home w/ his step-mother   Right-handed   Drinks about 2-3 Angus a day   Social Determinants of Health   Financial Resource Strain: Not on file  Food Insecurity: Not on file  Transportation Needs: Not on file  Physical Activity: Not on file  Stress: Not on file  Social Connections: Not on file   Past Surgical History:  Procedure  Laterality Date  . Irrigation and debridement of back abscess    . MASS EXCISION Right 06/14/2019   Procedure: EXCISION BENIGN LESION RIGHT FOOT;  Surgeon: [name], DPM;  Location: [facility];  Service: Podiatry;  Laterality: Right;  . NASAL SEPTOPLASTY W/ TURBINOPLASTY [name] 02/03/2018   Procedure: NASAL SEPTOPLASTY WITH TURBINATE REDUCTION;  Surgeon: [name], MD;  Location: [place];  Service: ENT;  Laterality: [name];  . PILONIDAL CYST DRAINAGE [name] 04/10/2017   Procedure: IRRIGATION AND DEBRIDEMENT BACK ABSCESS;  Surgeon: [name], MD;  [facility]: [facility];  Service: General;  Laterality: [name];  . SINUS ENDO WITH FUSION [name] 02/03/2018   Procedure: ENDOSCOPIC SINUS SURGERY WITH FUSION;  Surgeon: [name], MD;  Location: [place];  Service: ENT;  Laterality: [name];  . THORACIC OUTLET SURGERY  2000  . TOE ARTHROPLASTY Right 06/14/2019   Procedure: HALLUX ARTHROPLASTY RIGHT FOOT WITH TISSUE  TRANSER EXCISION OF SESAMOID BONE;  Surgeon: [name], DPM;  [facility];  Service: Podiatry;  Laterality: Right;  . WRIST FUSION  2002   Past Surgical History:  Procedure Laterality Date  . Irrigation and debridement of back abscess    . MASS EXCISION Right 06/14/2019   Procedure: EXCISION BENIGN LESION RIGHT FOOT;  Surgeon: [name], DPM;  [facility];  Service: Podiatry;  Laterality: Right;  . NASAL SEPTOPLASTY W/ TURBINOPLASTY [name] 02/03/2018   Procedure: NASAL SEPTOPLASTY WITH TURBINATE REDUCTION;  Surgeon: [name], MD;  Location: [place];  Service: ENT;  Laterality: [name];  . PILONIDAL CYST DRAINAGE [name] 04/10/2017   Procedure: IRRIGATION AND DEBRIDEMENT BACK ABSCESS;  Surgeon: [name], MD;  [facility];  Service: General;  Laterality: [name];  . SINUS ENDO WITH FUSION [name] 02/03/2018   Procedure: ENDOSCOPIC SINUS SURGERY WITH FUSION;  Surgeon: [name], MD;  Location: [place];  Service: ENT;  Laterality: [name];  . THORACIC OUTLET SURGERY  2000  . TOE ARTHROPLASTY Right 06/14/2019   Procedure: HALLUX ARTHROPLASTY RIGHT FOOT WITH TISSUE TRANSER EXCISION OF SESAMOID BONE;  Surgeon: [name], DPM;  [facility];  Service: Podiatry;  Laterality: Right;  . WRIST FUSION  2002   Past Medical History:  Diagnosis Date  . Anxiety   . Chronic back pain   . Degenerative disk disease   . Depression   . DVT of upper extremity (deep vein thrombosis) ([place])   . GERD (gastroesophageal reflux disease)   . Hypertension   . MRSA (methicillin resistant staph aureus) culture positive   . PAD (peripheral artery disease) ([place])   . Peripheral neuropathy   . Pilonidal cyst   . Pulmonary embolus ([place])    no blood thinner 2002  . Sleep apnea    can't use cpap due to deviated nasal septumg   There were no vitals taken for this visit.  Opioid Risk Score:   Fall Risk Score:  `1  Depression screen PHQ 2/9  Depression screen [facility] 2/9 10/02/2020 07/11/2020 06/05/2020  04/12/2020 04/05/2020 03/13/2020 02/27/2020  Decreased Interest 1 0 1 2 1 1 1   Down, Depressed, Hopeless 1 0 1 1 1 1  0  PHQ - 2 Score 2 0 2 3 2 2 1   Altered sleeping - 3 - 2 - - 3  Tired, decreased energy - 3 - 3 - - 3  Change in appetite - 1 - 3 - - 2  Feeling bad or failure about yourself  - 0 - 0 - - 0  Trouble  concentrating - 3 - 3 - - 3  Moving slowly or fidgety/restless - 3 - 1 - - 0  Suicidal thoughts - 0 - 0 - - 0  PHQ-9 Score - 13 - 15 - - 12  Difficult doing work/chores - - - Very difficult - - Very difficult  Some recent data might be hidden   Review of Systems  Constitutional: Negative.   HENT: Negative.   Eyes: Negative.   Respiratory: Negative.   Cardiovascular: Positive for leg swelling.  Gastrointestinal: Negative.   Endocrine: Negative.   Genitourinary: Negative.   Musculoskeletal: Positive for back pain and gait problem.       Spasms  Skin: Negative.   Allergic/Immunologic: Negative.   Neurological: Positive for weakness and numbness.       Tingling  Hematological: Negative.   Psychiatric/Behavioral: Positive for dysphoric mood. The patient is nervous/anxious.   All other systems reviewed and are negative.      Objective:   Physical Exam Gen: no distress, normal appearing HEENT: oral mucosa pink and moist, NCAT Cardio: Reg rate Chest: normal effort, normal rate of breathing Abd: soft, non-distended Ext: no edema Psych: pleasant, normal affect Skin: intact Neuro: Alert and oriented x3 Musculoskeletal: Bilateral lower extremities edematous and tender to palpation at bilateral knees and ankles Range of motion decreased to 60 degrees in abduction and elevation of left arm. Psych: pleasant, normal affect    Assessment & Plan:  1.Chronic idiopathic axonal polyneuropathy: Continue Lyrica.  2. Posterior tibial tendinitis of left lower extremity: Continue HEP as Tolerated. Continue to Monitor.  3. Lumbar DDD/ Lumbar Radiculitis: Continue current  medication regime and HEP as Tolerated.  4. Chronic Pain Syndrome: Continue Percocet 7.5/325mg one tablet5times daily as needed for pain. This has resulted in improved pain control, especially when he has to work 10 hour long days.  -Discussed current symptoms of pain and history of pain.  -Discussed benefits of exercise in reducing pain. -Discussed following foods that may reduce pain: 1) Ginger 2) Blueberries 3) Salmon 4) Pumpkin seeds 5) dark chocolate 6) turmeric 7) tart cherries 8) virgin olive oil 9) chilli peppers 10) mint  Link to further information on diet for chronic pain: [email or web address]    5. Obesity: Discussed good exercise and nutrition habits. Current weight is 361 lbs, down 6 lbs in the past 6 months! Made goal to decrease soda to down to twice per day. Monitor each visit.   6. Insomnia: Benefited from Amitriptyline 10mg  in the past. Does not require anymore- continues to be sleeping well!  7. Pilonidal cyst: Reviewed ID note. Recommended ID follow-up. Co-pay is expensive for patient and is the reason he could not follow-up. He cannot pursue surgery at this time due to cost. Patient has been taking medication for 9 months and tolerating it well. Discussed side effects of long term use of antibiotics. Prescribed DS Bactrim as previously ordered by ID. Plans to follow-up with ID by the end of the year. Referred to surgery for removal.   8) BP reviewed and better controlled this visit! Systolic is still slightly elevated.   9) Left sided adhesive capsulitis: Repeat steroid injection next visit.   All questions were encouraged and answered. Follow up with me in 2 months.

## 2020-12-04 NOTE — Patient Instructions (Signed)
-  Discussed following foods that may reduce pain: 1) Ginger 2) Blueberries 3) Salmon 4) Pumpkin seeds 5) dark chocolate 6) turmeric 7) tart cherries 8) virgin olive oil 9) chilli peppers 10) mint  Link to further information on diet for chronic pain: [email or web address]

## 2021-01-01 ENCOUNTER — Ambulatory Visit: Payer: BC Managed Care – PPO | Admitting: Physical Medicine and Rehabilitation

## 2021-01-03 ENCOUNTER — Ambulatory Visit: Payer: Self-pay | Admitting: Surgery

## 2021-01-03 NOTE — H&P (Signed)
Surgical Evaluation  Chief Complaint: pilonidal disease  HPI: 50 year old man with a 20+ year history of pilonidal disease. He has had to have an I&D one time but otherwise no procedures in the area. This will intermittently swell and become inflamed and then spontaneously drains purulent material. He is interested in surgical options to treat this.   Allergies  Allergen Reactions  . Pollen Extract Cough  . Dust Mite Extract Other (See Comments)  . Other Other (See Comments)    Ragweed     Past Medical History:  Diagnosis Date  . Anxiety   . Chronic back pain   . Degenerative disk disease   . Depression   . DVT of upper extremity (deep vein thrombosis) ([place])   . GERD (gastroesophageal reflux disease)   . Hypertension   . MRSA (methicillin resistant staph aureus) culture positive   . PAD (peripheral artery disease) ([place])   . Peripheral neuropathy   . Pilonidal cyst   . Pulmonary embolus ([place])    no blood thinner 2002  . Sleep apnea    can't use cpap due to deviated nasal septumg    Past Surgical History:  Procedure Laterality Date  . Irrigation and debridement of back abscess    . MASS EXCISION Right 06/14/2019   Procedure: EXCISION BENIGN LESION RIGHT FOOT;  Surgeon: [name], DPM;  [facility];  Service: Podiatry;  Laterality: Right;  . NASAL SEPTOPLASTY W/ TURBINOPLASTY [name] 02/03/2018   Procedure: NASAL SEPTOPLASTY WITH TURBINATE REDUCTION;  Surgeon: [name], MD;  Location: [place];  Service: ENT;  Laterality: [name];  . PILONIDAL CYST DRAINAGE [name] 04/10/2017   Procedure: IRRIGATION AND DEBRIDEMENT BACK ABSCESS;  Surgeon: [name], MD;  [facility];  Service: General;  Laterality: [name];  . SINUS ENDO WITH FUSION [name] 02/03/2018   Procedure: ENDOSCOPIC SINUS SURGERY WITH FUSION;  Surgeon: [name], MD;  Location: [facility];  Service: ENT;  Laterality: [name];  . THORACIC OUTLET SURGERY  2000  . TOE ARTHROPLASTY Right 06/14/2019   Procedure: HALLUX  ARTHROPLASTY RIGHT FOOT WITH TISSUE TRANSER EXCISION OF SESAMOID BONE;  Surgeon: [name], DPM;  [facility];  Service: Podiatry;  Laterality: Right;  . WRIST FUSION  2002    Family History  Problem Relation Age of Onset  . Cancer Maternal Grandmother        breast, ovarian & colon  . Hypertension Mother   . Diabetes Mother   . Hypertension Father   . Heart disease Father   . Stroke Paternal Grandfather   . Neuropathy Neg Hx     Social History   Socioeconomic History  . Marital status: Divorced    Spouse name: Not on file  . Number of children: 2  . Years of education: 12th   . Highest education level: Not on file  Occupational History  . Occupation: [name]  Tobacco Use  . Smoking status: Current Every Day Smoker    Packs/day: 0.25    Years: 33.00    Pack years: 8.25    Types: Cigarettes  . Smokeless tobacco: Never Used  . Tobacco comment: 06/28/19 1/4 pack per day  Vaping Use  . Vaping Use: Former  . Substances: CBD  Substance and Sexual Activity  . Alcohol use: No    Alcohol/week: 0.0 standard drinks    Comment: Rarely  . Drug use: No  . Sexual activity: Yes    Birth control/protection: None  Other Topics Concern  . Not on  file  Social History Narrative   Lives at home w/ his step-mother   Right-handed   Drinks about 2-3 [place] a day   Social Determinants of Health   Financial Resource Strain: Not on file  Food Insecurity: Not on file  Transportation Needs: Not on file  Physical Activity: Not on file  Stress: Not on file  Social Connections: Not on file    Current Outpatient Medications on File Prior to Visit  Medication Sig Dispense Refill  . Acetaminophen (TYLENOL ARTHRITIS PAIN PO) Take 600 mg by mouth 3 (three) times daily.    [name] amitriptyline (ELAVIL) 10 MG tablet Take 1 tablet (10 mg total) by mouth at bedtime. 30 tablet 3  . atorvastatin (LIPITOR) 10 MG tablet Take 1 tablet (10 mg total) by mouth daily. 90 tablet 3  . azelastine  (ASTELIN) 0.1 % nasal spray Place 1 spray into both nostrils 2 (two) times daily. Use in each nostril as directed 30 mL 1  . [employer] NICOTINE TRANSDERMAL SYS 14 MG/24HR patch 14 mg daily.    . fluticasone (FLONASE) 50 MCG/ACT nasal spray 1 spray each nostril twice daily after sinus rinses 16 g 2  . furosemide (LASIX) 40 MG tablet Take 1 tablet (40 mg total) by mouth daily as needed (if weight increases 3 lbs overnight or 5 lbs in 1 week). (Patient not taking: Reported on 06/25/2020) 30 tablet 3  . montelukast (SINGULAIR) 10 MG tablet Take 1 tablet (10 mg total) by mouth at bedtime. 90 tablet 0  . Nutritional Supplements (JUICE PLUS FIBRE PO) Take 4 each by mouth daily with lunch.    . oxyCODONE-acetaminophen (PERCOCET) 7.5-325 MG tablet Take 1 tablet by mouth 5 (five) times daily as needed. 300 tablet 0  . pregabalin (LYRICA) 300 MG capsule Take 1 capsule (300 mg total) by mouth 2 (two) times daily. 60 capsule 2  . sulfamethoxazole-trimethoprim (BACTRIM DS) 800-160 MG tablet Take 1 tablet by mouth 2 (two) times daily. 120 tablet 3   No current facility-administered medications on file prior to visit.    Review of Systems: a complete, 10pt review of systems was completed with pertinent positives and negatives as documented in the HPI  Physical Exam: Vitals  Weight: 357 lb Height: 76in Body Surface Area: 2.83 m Body Mass Index: 43.45 kg/m  Temp.: 98.51F  Pulse: 94 (Regular)  P.OX: 97% (Room air) BP: 152/92(Sitting, Left Arm, Standard)  In the natal cleft there are 3 areas of chronic scar, 2 to the right and one left of midline. There are 2 or 3 small pits. There is no active drainage or inflammation at this time.   CBC Latest Ref Rng & Units 02/23/2020 06/12/2019 09/28/2018  WBC 3.4 - 10.8 x10E3/uL 8.9 10.9(H) 10.2  Hemoglobin 13.0 - 17.7 g/dL 16.0 14.0 15.5  Hematocrit 37.5 - 51.0 % 46.1 40.6 45.7  Platelets 150 - 450 x10E3/uL 181 189 219    CMP Latest Ref Rng & Units 04/12/2020  02/23/2020 09/06/2019  Glucose 65 - 99 mg/dL 122(H) 95 125(H)  BUN 6 - 24 mg/dL 13 20 14   Creatinine 0.76 - 1.27 mg/dL 1.13 1.34(H) 1.15  Sodium 134 - 144 mmol/L 140 136 138  Potassium 3.5 - 5.2 mmol/L 4.3 4.5 4.3  Chloride 96 - 106 mmol/L 105 98 104  CO2 20 - 29 mmol/L 22 23 27   Calcium 8.7 - 10.2 mg/dL 8.9 9.5 9.1  Total Protein 6.0 - 8.5 g/dL 7.3 7.3 -  Total Bilirubin 0.0 -  1.2 mg/dL 0.8 0.6 -  Alkaline Phos 48 - 121 IU/L 121 146(H) -  AST 0 - 40 IU/L 17 16 -  ALT 0 - 44 IU/L 16 14 -    No results found for: INR, PROTIME  Imaging: No results found.   A/P: PILONIDAL CYST WITHOUT INFECTION (L05.91) Impression: Discussed options including trephination, marsupialization, wide excision with possible flap closure. Discussed risks and benefits of each. In his strenuous job and need to return to work as quickly as possible, we discussed the trephination would be ideal for him. Discussed hair removal in the interim and indefinitely afterwards in order to avoid recurrence. Questions were answered. He wishes to proceed with surgery.    Patient Active Problem List   Diagnosis Date Noted  . Hypertriglyceridemia 04/12/2020  . Ulcer of great toe, right, with fat layer exposed ([place])   . Surgery, elective 05/16/2019  . Acute idiopathic gout of multiple sites 03/23/2019  . Recurrent infections 03/07/2019  . Blepharitis of right upper eyelid 03/07/2019  . Body mass index 40.0-44.9, adult ([place]) 04/19/2018  . Pain in left foot 04/19/2018  . Shortness of breath 04/07/2018  . Lower extremity edema 04/07/2018  . Stasis dermatitis of both legs 04/07/2018  . Hypokalemia 04/07/2018  . Healthcare maintenance 04/07/2018  . Great toe pain, left 03/08/2018  . Acute gout involving toe of left foot 03/08/2018  . Low libido 02/09/2018  . OSA on CPAP 02/03/2018  . Status post nasal septoplasty 02/03/2018  . Chronic pansinusitis 12/07/2017  . Hypertrophy, nasal, turbinate 12/07/2017  . Deviated septum  11/11/2017  . Umbilical hernia without obstruction and without gangrene 11/11/2017  . Scrotal pain 11/11/2017  . Scrotal cyst 11/11/2017  . Screening, lipid 11/11/2017  . Screening for diabetes mellitus (DM) 11/11/2017  . Screening for thyroid disorder 11/11/2017  . Anxiety and depression 08/11/2017  . Acute maxillary sinusitis 08/11/2017  . 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 Lumbar degenerative disc disease 08/02/2017  . Acute left ankle pain 07/12/2017  . Skin infection 06/03/2017  . Chronic folliculitis 25/00/3704  . [place] 04/10/2017  . Cellulitis and abscess of trunk s/p I&D 04/10/2017 04/06/2017  . History of MRSA infection 04/06/2017  . Physical exam, annual 03/03/2017  . Neuropathy 09/28/2015  . Major depression 12/26/2012  . Generalized anxiety disorder 12/26/2012  . Opiate dependence ([place]) 12/24/2012  . Benzodiazepine dependence ([place]) 12/24/2012  . HTN (hypertension) 12/24/2012  . Pilonidal cyst 11/22/2012  . Morbid obesity ([place]) 09/09/2010  . TOBACCO ABUSE 09/09/2010  . LOW BACK PAIN, CHRONIC 09/09/2010       Romana Juniper, [facility]  See AMION to contact appropriate on-call provider

## 2021-01-07 ENCOUNTER — Other Ambulatory Visit: Payer: Self-pay

## 2021-01-07 ENCOUNTER — Ambulatory Visit (INDEPENDENT_AMBULATORY_CARE_PROVIDER_SITE_OTHER): Payer: BC Managed Care – PPO | Admitting: Nurse Practitioner

## 2021-01-07 ENCOUNTER — Telehealth: Payer: Self-pay | Admitting: Physician Assistant

## 2021-01-07 ENCOUNTER — Encounter: Payer: Self-pay | Admitting: Nurse Practitioner

## 2021-01-07 ENCOUNTER — Other Ambulatory Visit: Payer: Self-pay | Admitting: Physician Assistant

## 2021-01-07 VITALS — BP 132/84 | HR 76 | Temp 98.0°F | Ht 77.0 in | Wt 359.9 lb

## 2021-01-07 DIAGNOSIS — R600 Localized edema: Secondary | ICD-10-CM

## 2021-01-07 DIAGNOSIS — F17200 Nicotine dependence, unspecified, uncomplicated: Secondary | ICD-10-CM

## 2021-01-07 DIAGNOSIS — J449 Chronic obstructive pulmonary disease, unspecified: Secondary | ICD-10-CM

## 2021-01-07 DIAGNOSIS — J3089 Other allergic rhinitis: Secondary | ICD-10-CM

## 2021-01-07 DIAGNOSIS — R062 Wheezing: Secondary | ICD-10-CM | POA: Diagnosis not present

## 2021-01-07 DIAGNOSIS — J302 Other seasonal allergic rhinitis: Secondary | ICD-10-CM

## 2021-01-07 DIAGNOSIS — R0602 Shortness of breath: Secondary | ICD-10-CM | POA: Diagnosis not present

## 2021-01-07 MED ORDER — MEDICAL COMPRESSION STOCKINGS MISC: 5 refills | Status: DC

## 2021-01-07 MED ORDER — BUDESONIDE-FORMOTEROL FUMARATE 80-4.5 MCG/ACT IN AERO: 2.0000 | INHALATION_SPRAY | Freq: Two times a day (BID) | RESPIRATORY_TRACT | 1 refills | Status: DC

## 2021-01-07 MED ORDER — ALBUTEROL SULFATE HFA 108 (90 BASE) MCG/ACT IN AERS: 2.0000 | INHALATION_SPRAY | Freq: Four times a day (QID) | RESPIRATORY_TRACT | 1 refills | Status: DC | PRN

## 2021-01-07 NOTE — Patient Instructions (Signed)
American Journal of Respiratory and Critical Care Medicine, 202(2), e5-e31. https://doi.org/10.1164/rccm.202005-1982ST">  Health Risks of Smoking Smoking tobacco is very bad for your health. Tobacco smoke contains many toxic chemicals that can damage every part of your body. Secondhand smoke can be harmful to those around you. Tobacco or nicotine use can cause many long-term (chronic) diseases. Smoking is difficult to quit because a chemical in tobacco, called nicotine, causes addiction or dependence. When you smoke and inhale, nicotine is absorbed quickly into the bloodstream through your lungs. Both inhaled and non-inhaled nicotine may be addictive. How can quitting affect me? There are health benefits of quitting smoking. Some benefits happen right away and others take time. Benefits may include:  Blood flow, blood pressure, heart rate, and lung capacity may begin to improve. However, any lung damage that has already occurred cannot be repaired.  Temporary respiratory symptoms, such as nasal congestion and cough, may improve over time.  Your risk of heart disease, stroke, and cancer is reduced.  The overall quality of your health may improve.  You may save money, as you will not spend money on tobacco products and may spend less money on smoking-related health issues. What can increase my risk? Smoking harms nearly every organ in the body. People who smoke tobacco have a shorter life expectancy and an increased risk of many serious medical problems. These include:  More respiratory infections, such as colds and pneumonia.  Cancer.  Heart disease.  Stroke.  Chronic respiratory diseases.  Delayed wound healing and increased risk of complications during surgery.  Problems with reproduction, pregnancy, and childbirth, such as infertility, early (premature) births, stillbirths, and birth defects. Secondhand smoke exposure to children increases the risk of:  Sudden infant death  syndrome (SIDS).  Infections in the nose, throat, or airways (respiratory infections).  Chronic respiratory symptoms.   What actions can I take to quit? Smoking is an addiction that affects both your body and your mind, and long-time habits can be hard to change. Your health care provider can recommend:  Nicotine replacement products, such as patches, gum, and nasal sprays. Use these products only as directed. Do not replace cigarette smoking with electronic cigarettes, which are commonly called e-cigarettes. The safety of e-cigarettes is not known, and some may contain harmful chemicals.  Programs and community resources, which may include group support, education, or talk therapy.  Prescription medicines to help reduce cravings.  A combination of two or more quit methods, which will increase the success of quitting.   Where to find support Follow the recommendations from your health care provider about support groups and other assistance. You can also visit:  North American Quitline Consortium: www.naquitline.org or call 1-800-QUIT-NOW.  U.S. Department of Health and Human Services: www.smokefree.gov  American Lung Association: www.freedomfromsmoking.org  American Heart Association: www.heart.org Where to find more information  Centers for Disease Control and Prevention: www.cdc.gov  World Health Organization: www.who.int Summary  Smoking tobacco is very bad for your health. Tobacco smoke contains many toxic chemicals that can damage every part of the body.  Smoking is difficult to quit because a chemical in tobacco, called nicotine, causes addiction or dependence.  There are immediate and long-term health benefits of quitting smoking.  A combination of two or more quit methods increases the success of quitting. This information is not intended to replace advice given to you by your health care provider. Make sure you discuss any questions you have with your health care  provider. Document Revised: 11/20/2019 Document Reviewed: 11/20/2019   Elsevier Patient Education  2021 [employer].

## 2021-01-07 NOTE — Telephone Encounter (Signed)
Patient called office with complaints of chronic SOB. 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 Patient declines acute SOB at this time and was advised should he start feeling the SOB again to go to the ER for evaluation and treatment. Pt verbalized understanding. Pt was scheduled today at 3pm with [facility] for evaluation. AS, CMA

## 2021-01-07 NOTE — Progress Notes (Signed)
Acute Office Visit  Subjective:    Patient ID: Eduardo [name], male    DOB: [birth year], 50 y.o.   MRN: [id number]  Chief Complaint  Patient presents with  . Shortness of Breath    HPI Patient is in today for evaluation of shortness of breath. This is worse with exertion. This has been going on for some time but has been much worse over the past six months. He states he is unable to walk short distances without having to rest and catch his breath. Heat also makes this much worse. Laying down on his back worsens the SOB. He is having to sleep nearly sitting up. Feels as though there is elephant sitting on his chest. The patient states that he had ECG less than a year ago. States that there were no acute abnormalities. The patient states that he is a smoker. He smokes at least a pack of cigarettes per day. States that the harder he tries to quit smoking, the more he actually smokes. He states that he has also been diagnosed with OSA. Is supposed to be wearing a CPAP mask at night. He states he is unable to tolerate the mask. Causes him to feel very claustrophobic. Ends up taking or ripping off the mask in his sleep.  At the end of the visit, the patient also states that he has severe swelling and discoloration of both lower legs. He is on his feet for long hours during the day. He does not wear compression socks or stockings. The legs and feet are painful. He states that he has had them evaluated in the past and circulation was intact. Reviewed studies previously done. ECG in 12/2019 was normal. His echo, also done 12/2019 showed hyperdynamic left ventricular function. There is mild LVH and mild dilation of the left and right atria. A heart CT with calcium score gave him a calciuum socre of 119. He has non obstructing CAD with plaque causing mild stenosis of LAD.   Past Medical History:  Diagnosis Date  . Anxiety   . Chronic back pain   . Degenerative disk disease   . Depression   . DVT of  upper extremity (deep vein thrombosis) ([place])   . GERD (gastroesophageal reflux disease)   . Hypertension   . MRSA (methicillin resistant staph aureus) culture positive   . PAD (peripheral artery disease) ([place])   . Peripheral neuropathy   . Pilonidal cyst   . Pulmonary embolus ([place])    no blood thinner 2002  . Sleep apnea    can't use cpap due to deviated nasal septumg    Past Surgical History:  Procedure Laterality Date  . Irrigation and debridement of back abscess    . MASS EXCISION Right 06/14/2019   Procedure: EXCISION BENIGN LESION RIGHT FOOT;  Surgeon: [name], DPM;  Location: [facility];  Service: Podiatry;  Laterality: Right;  . NASAL SEPTOPLASTY W/ TURBINOPLASTY [name] 02/03/2018   Procedure: NASAL SEPTOPLASTY WITH TURBINATE REDUCTION;  Surgeon: [name], MD;  Location: [facility];  Service: ENT;  Laterality: [name];  . PILONIDAL CYST DRAINAGE [name] 04/10/2017   Procedure: IRRIGATION AND DEBRIDEMENT BACK ABSCESS;  Surgeon: [name], MD;  Location: [facility];  Service: General;  Laterality: [name];  . SINUS ENDO WITH FUSION [name] 02/03/2018   Procedure: ENDOSCOPIC SINUS SURGERY WITH FUSION;  Surgeon: [name], MD;  Location: [facility];  Service: ENT;  Laterality: [name];  . THORACIC OUTLET SURGERY  2000  . TOE ARTHROPLASTY Right  06/14/2019   Procedure: HALLUX ARTHROPLASTY RIGHT FOOT WITH TISSUE TRANSER EXCISION OF SESAMOID BONE;  Surgeon: [name], DPM;  Location: [facility];  Service: Podiatry;  Laterality: Right;  . WRIST FUSION  2002    Family History  Problem Relation Age of Onset  . Cancer Maternal Grandmother        breast, ovarian & colon  . Hypertension Mother   . Diabetes Mother   . Hypertension Father   . Heart disease Father   . Stroke Paternal Grandfather   . Neuropathy Neg Hx     Social History   Socioeconomic History  . Marital status: Divorced    Spouse name: Not on file  . Number of children: 2  . Years of education: 12th   . Highest education level: Not on  file  Occupational History  . Occupation: [name]  Tobacco Use  . Smoking status: Current Every Day Smoker    Packs/day: 0.25    Years: 33.00    Pack years: 8.25    Types: Cigarettes  . Smokeless tobacco: Never Used  . Tobacco comment: 06/28/19 1/4 pack per day  Vaping Use  . Vaping Use: Former  . Substances: CBD  Substance and Sexual Activity  . Alcohol use: No    Alcohol/week: 0.0 standard drinks    Comment: Rarely  . Drug use: No  . Sexual activity: Yes    Birth control/protection: None  Other Topics Concern  . Not on file  Social History Narrative   Lives at home w/ his step-mother   Right-handed   Drinks about 2-3 [place] a day   Social Determinants of Health   Financial Resource Strain: Not on file  Food Insecurity: Not on file  Transportation Needs: Not on file  Physical Activity: Not on file  Stress: Not on file  Social Connections: Not on file  Intimate Partner Violence: Not on file    Outpatient Medications Prior to Visit  Medication Sig Dispense Refill  . Acetaminophen (TYLENOL ARTHRITIS PAIN PO) Take 600 mg by mouth 3 (three) times daily.    [name] azelastine (ASTELIN) 0.1 % nasal spray Place 1 spray into both nostrils 2 (two) times daily. Use in each nostril as directed 30 mL 1  . [employer] NICOTINE TRANSDERMAL SYS 14 MG/24HR patch 14 mg daily.    . fluticasone (FLONASE) 50 MCG/ACT nasal spray 1 spray each nostril twice daily after sinus rinses 16 g 2  . Nutritional Supplements (JUICE PLUS FIBRE PO) Take 4 each by mouth daily with lunch.    . oxyCODONE-acetaminophen (PERCOCET) 7.5-325 MG tablet Take 1 tablet by mouth 5 (five) times daily as needed. 300 tablet 0  . pregabalin (LYRICA) 300 MG capsule Take 1 capsule (300 mg total) by mouth 2 (two) times daily. 60 capsule 2  . sulfamethoxazole-trimethoprim (BACTRIM DS) 800-160 MG tablet Take 1 tablet by mouth 2 (two) times daily. 120 tablet 3  . montelukast (SINGULAIR) 10 MG tablet Take 1 tablet (10 mg total) by mouth at  bedtime. 90 tablet 0  . amitriptyline (ELAVIL) 10 MG tablet Take 1 tablet (10 mg total) by mouth at bedtime. (Patient not taking: Reported on 01/07/2021) 30 tablet 3  . atorvastatin (LIPITOR) 10 MG tablet Take 1 tablet (10 mg total) by mouth daily. (Patient not taking: Reported on 01/07/2021) 90 tablet 3  . furosemide (LASIX) 40 MG tablet Take 1 tablet (40 mg total) by mouth daily as needed (if weight increases 3 lbs overnight or 5 lbs  in 1 week). (Patient not taking: Reported on 06/25/2020) 30 tablet 3   No facility-administered medications prior to visit.    Allergies  Allergen Reactions  . Pollen Extract Cough  . Dust Mite Extract Other (See Comments)  . Other Other (See Comments)    Ragweed     Review of Systems  Constitutional: Positive for activity change and fatigue. Negative for chills and fever.       Decreased activity levels due to increased shortness of breath and fatigue.   HENT: Negative for congestion and sinus pain.   Respiratory: Positive for chest tightness, shortness of breath and wheezing. Negative for cough.        Patient states that he feels like there is elephant sitting on his chest   Cardiovascular: Positive for leg swelling. Negative for chest pain and palpitations.       Legs moderately swelling and tender. They are discolored as well.   Gastrointestinal: Negative for constipation.  Musculoskeletal: Positive for arthralgias. Negative for back pain and myalgias.       Bilateral lower leg pain and swelling  Skin: Positive for color change. Negative for rash.       Darkening of the skin of both lower legs.   Allergic/Immunologic: Positive for environmental allergies.  Neurological: Positive for headaches. Negative for dizziness and weakness.  Psychiatric/Behavioral: Positive for dysphoric mood and sleep disturbance. The patient is nervous/anxious.   All other systems reviewed and are negative.      Objective:    Physical Exam Vitals and nursing note  reviewed.  Constitutional:      Appearance: Normal appearance. He is well-developed. He is obese. He is ill-appearing.  HENT:     Head: Normocephalic and atraumatic.     Right Ear: Ear canal and external ear normal.     Left Ear: Ear canal and external ear normal.     Nose: Nose normal.  Eyes:     Pupils: Pupils are equal, round, and reactive to light.  Neck:     Vascular: No carotid bruit.  Cardiovascular:     Rate and Rhythm: Normal rate and regular rhythm.     Heart sounds: Normal heart sounds.     Comments: Bilateral lower legs have 1+ pitting edema and ave a dark discoloration. They are tender to palpate.  Pulmonary:     Effort: Pulmonary effort is normal.     Breath sounds: Normal breath sounds.     Comments: Breath sounds are diminished throughout the lung fields. No wheezes or adventitious breath sounds auscultated at this time.  Abdominal:     Palpations: Abdomen is soft.  Musculoskeletal:        General: Normal range of motion.     Cervical back: Normal range of motion and neck supple.  Skin:    General: Skin is warm and dry.     Capillary Refill: Capillary refill takes more than 3 seconds.     Comments: Hyperpigmentation of the skin of bilateral lower legs.   Neurological:     General: No focal deficit present.     Mental Status: He is alert and oriented to person, place, and time.  Psychiatric:        Attention and Perception: Attention and perception normal.        Mood and Affect: Affect normal. Mood is depressed.        Speech: Speech normal.        Behavior: Behavior normal. Behavior is cooperative.  Thought Content: Thought content normal.        Cognition and Memory: Cognition and memory normal.        Judgment: Judgment normal.     Today's Vitals   01/07/21 1457  BP: 132/84  Pulse: 76  Temp: 98 F (36.7 C)  SpO2: 95%  Weight: (!) 359 lb 14.4 oz (163.2 kg)  Height: 6\' 5"  (1.956 m)   Body mass index is 42.68 kg/m.   Wt Readings from Last  3 Encounters:  01/07/21 (!) 359 lb 14.4 oz (163.2 kg)  12/04/20 (!) 361 lb (163.7 kg)  10/02/20 253 lb (114.8 kg)    Health Maintenance Due  Topic Date Due  . URINE MICROALBUMIN  Never done    There are no preventive care reminders to display for this patient.   Lab Results  Component Value Date   TSH 1.680 02/23/2020   Lab Results  Component Value Date   WBC 8.9 02/23/2020   HGB 16.0 02/23/2020   HCT 46.1 02/23/2020   MCV 95 02/23/2020   PLT 181 02/23/2020   Lab Results  Component Value Date   NA 140 04/12/2020   K 4.3 04/12/2020   CO2 22 04/12/2020   GLUCOSE 122 (H) 04/12/2020   BUN 13 04/12/2020   CREATININE 1.13 04/12/2020   BILITOT 0.8 04/12/2020   ALKPHOS 121 04/12/2020   AST 17 04/12/2020   ALT 16 04/12/2020   PROT 7.3 04/12/2020   ALBUMIN 4.3 04/12/2020   CALCIUM 8.9 04/12/2020   ANIONGAP 9 06/12/2019   Lab Results  Component Value Date   CHOL 92 (L) 02/23/2020   Lab Results  Component Value Date   HDL 19 (L) 02/23/2020   Lab Results  Component Value Date   LDLCALC 28 02/23/2020   Lab Results  Component Value Date   TRIG 299 (H) 02/23/2020   Lab Results  Component Value Date   CHOLHDL 4.8 02/23/2020   Lab Results  Component Value Date   HGBA1C 5.0 02/23/2020       Assessment & Plan:  1. Shortness of breath This is likely pulmonary issue. Reviewed CT chest with calcium scoring. He has calcium score of 119 with non obstructive CAD with plaque causing mild LAD. He is currently on atorvastatin. Will monitor closely.   2. Chronic obstructive pulmonary disease, unspecified COPD type ([place]) Has at least a 35 year pack history of smoking. Will get CT lung cancer screening. Will start symbicort 80/4.12mcg inhaler. He should use two inhalations twice daily. Use rescue inhaler as needed and as prescribed for wheezing and shortness of breath.  - budesonide-formoterol (SYMBICORT) 80-4.5 MCG/ACT inhaler; Inhale 2 puffs into the lungs 2 (two) times  daily.  Dispense: 1 each; Refill: 1 - CT CHEST LUNG CA SCREEN LOW DOSE W/O CM; Future  3. Wheezing Added symbicort 80/4.7mcg inhaler. Use two inhalations twice daily. Use rescue inhaler as needed and as prescribed for wheezing and shortness of breath.  - albuterol (VENTOLIN HFA) 108 (90 Base) MCG/ACT inhaler; Inhale 2 puffs into the lungs every 6 (six) hours as needed for wheezing or shortness of breath.  Dispense: 1 each; Refill: 1  4. Nicotine dependence with current use Has at least 35 pack year history of smoking and continues to smoke. Will get CT chest, lung cancer screening.  - CT CHEST LUNG CA SCREEN LOW DOSE W/O CM; Future  5. Bilateral lower extremity edema Reviewed MRI of the left foot without contrast which did show multiple arthritic changes.  Prescription given for him to get measured for compression socks. He should wear these every day while awake. Will get vascular study of the lower extremities as indicated.  - Elastic Bandages & Supports (MEDICAL COMPRESSION STOCKINGS) MISC; Wear one pair compression stockings daily while awake. Take off at bedtime.  Dispense: 2 each; Refill: 5  Problem List Items Addressed This Visit      Respiratory   Chronic obstructive pulmonary disease (HCC)   Relevant Medications   budesonide-formoterol (SYMBICORT) 80-4.5 MCG/ACT inhaler   albuterol (VENTOLIN HFA) 108 (90 Base) MCG/ACT inhaler   Other Relevant Orders   CT CHEST LUNG CA SCREEN LOW DOSE W/O CM     Other   Nicotine dependence with current use   Relevant Orders   CT CHEST LUNG CA SCREEN LOW DOSE W/O CM   Shortness of breath - Primary   Bilateral lower extremity edema   Relevant Medications   Elastic Bandages & Supports (MEDICAL COMPRESSION STOCKINGS) MISC   Wheezing   Relevant Medications   albuterol (VENTOLIN HFA) 108 (90 Base) MCG/ACT inhaler       Meds ordered this encounter  Medications  . budesonide-formoterol (SYMBICORT) 80-4.5 MCG/ACT inhaler    Sig: Inhale 2  puffs into the lungs 2 (two) times daily.    Dispense:  1 each    Refill:  1    Please fill with alternative preferred by patient's insurance    Order Specific Question:   Supervising Provider    Answer:   Beatrice Lecher D [[id number]]  . albuterol (VENTOLIN HFA) 108 (90 Base) MCG/ACT inhaler    Sig: Inhale 2 puffs into the lungs every 6 (six) hours as needed for wheezing or shortness of breath.    Dispense:  1 each    Refill:  1    Order Specific Question:   Supervising Provider    Answer:   Beatrice Lecher D [[id number]]  . Elastic Bandages & Supports (MEDICAL COMPRESSION STOCKINGS) MISC    Sig: Wear one pair compression stockings daily while awake. Take off at bedtime.    Dispense:  2 each    Refill:  5    Please measure and fit patient for appropriate sized compression stockings.    Order Specific Question:   Supervising Provider    Answer:   Beatrice Lecher D [[id number]]   Time spent with the patient was approximately 45 minutes. This time included reviewing progress notes, labs, imaging studies, and discussing plan for follow up.   Ronnell Freshwater, NP

## 2021-01-14 DIAGNOSIS — J449 Chronic obstructive pulmonary disease, unspecified: Secondary | ICD-10-CM | POA: Insufficient documentation

## 2021-01-14 DIAGNOSIS — R062 Wheezing: Secondary | ICD-10-CM | POA: Insufficient documentation

## 2021-01-22 ENCOUNTER — Encounter: Payer: BC Managed Care – PPO | Admitting: Physician Assistant

## 2021-01-24 ENCOUNTER — Ambulatory Visit
Admission: RE | Admit: 2021-01-24 | Discharge: 2021-01-24 | Disposition: A | Discharge status: Home / Self Care | Payer: BC Managed Care – PPO | Source: Ambulatory Visit | Attending: Nurse Practitioner | Admitting: Nurse Practitioner

## 2021-01-24 DIAGNOSIS — F17200 Nicotine dependence, unspecified, uncomplicated: Secondary | ICD-10-CM

## 2021-01-24 DIAGNOSIS — J449 Chronic obstructive pulmonary disease, unspecified: Secondary | ICD-10-CM

## 2021-01-27 ENCOUNTER — Telehealth: Payer: Self-pay

## 2021-01-27 ENCOUNTER — Other Ambulatory Visit: Payer: Self-pay | Admitting: Physical Medicine and Rehabilitation

## 2021-01-27 ENCOUNTER — Telehealth: Payer: Self-pay | Admitting: Physician Assistant

## 2021-01-27 DIAGNOSIS — J439 Emphysema, unspecified: Secondary | ICD-10-CM

## 2021-01-27 DIAGNOSIS — R062 Wheezing: Secondary | ICD-10-CM

## 2021-01-27 DIAGNOSIS — R0602 Shortness of breath: Secondary | ICD-10-CM

## 2021-01-27 MED ORDER — OXYCODONE-ACETAMINOPHEN 7.5-325 MG PO TABS
1.0000 | ORAL_TABLET | Freq: Every day | ORAL | 0 refills | Status: DC | PRN
Start: 2021-01-27 — End: 2021-02-24

## 2021-01-27 NOTE — Progress Notes (Signed)
Patient notified over the telephone of results. Referred to pulmonology for further evaluation.

## 2021-01-27 NOTE — Telephone Encounter (Signed)
Pt advised of chest CT results. Patient requesting Eduardo [name] to take him out of work and put him on disability because he is having such a hard time breathing.   Per Eduardo [name], placing urgent referral to Pulm for Emphysema. Pt is agreeable. AS, CMA

## 2021-01-27 NOTE — Telephone Encounter (Signed)
Patient would like to know the most recent results of his MRI. Thanks

## 2021-01-27 NOTE — Telephone Encounter (Signed)
Please let him know I have sent and I was told as per clinic policy we can only send 1 month supply at a time, thanks [place]!

## 2021-01-27 NOTE — Telephone Encounter (Signed)
Requesting refill Oxycodone/APAP last filled #[phone] on 12/02/20

## 2021-01-27 NOTE — Addendum Note (Signed)
Addended by: Mickel Crow on: 01/27/2021 04:00 PM   Modules accepted: Orders

## 2021-01-28 ENCOUNTER — Other Ambulatory Visit: Payer: Self-pay | Admitting: Physical Medicine and Rehabilitation

## 2021-01-28 DIAGNOSIS — G609 Hereditary and idiopathic neuropathy, unspecified: Secondary | ICD-10-CM

## 2021-01-29 ENCOUNTER — Ambulatory Visit: Payer: BC Managed Care – PPO | Admitting: Physical Medicine and Rehabilitation

## 2021-02-07 ENCOUNTER — Encounter
Payer: BC Managed Care – PPO | Attending: Physical Medicine & Rehabilitation | Admitting: Physical Medicine and Rehabilitation

## 2021-02-07 ENCOUNTER — Encounter: Payer: Self-pay | Admitting: Physical Medicine and Rehabilitation

## 2021-02-07 ENCOUNTER — Other Ambulatory Visit: Payer: Self-pay

## 2021-02-07 VITALS — BP 146/84 | HR 88 | Temp 98.2°F | Ht 77.0 in | Wt 362.0 lb

## 2021-02-07 DIAGNOSIS — M25572 Pain in left ankle and joints of left foot: Secondary | ICD-10-CM | POA: Diagnosis present

## 2021-02-07 DIAGNOSIS — M25571 Pain in right ankle and joints of right foot: Secondary | ICD-10-CM

## 2021-02-07 DIAGNOSIS — J438 Other emphysema: Secondary | ICD-10-CM | POA: Diagnosis present

## 2021-02-07 DIAGNOSIS — Z79891 Long term (current) use of opiate analgesic: Secondary | ICD-10-CM

## 2021-02-07 DIAGNOSIS — Z5181 Encounter for therapeutic drug level monitoring: Secondary | ICD-10-CM | POA: Diagnosis present

## 2021-02-07 DIAGNOSIS — G894 Chronic pain syndrome: Secondary | ICD-10-CM | POA: Diagnosis present

## 2021-02-07 NOTE — Patient Instructions (Signed)
-  Discussed following foods that may reduce pain: 1) Ginger (especially studied for arthritis)- reduce leukotriene production to decrease inflammation 2) Blueberries- high in phytonutrients that decrease inflammation 3) Salmon- marine omega-3s reduce joint swelling and pain 4) Pumpkin seeds- reduce inflammation 5) dark chocolate- reduces inflammation 6) turmeric- reduces inflammation 7) tart cherries - reduce pain and stiffness 8) extra virgin olive oil - its compound olecanthal helps to block prostaglandins  9) chili peppers- can be eaten or applied topically via capsaicin 10) mint- helpful for headache, muscle aches, joint pain, and itching 11) garlic- reduces inflammation  Link to further information on diet for chronic pain: [email or web address]

## 2021-02-07 NOTE — Progress Notes (Signed)
Subjective:    Patient ID: JAQUA CHING, male    DOB: 1971-06-17, 50 y.o.   MRN: [id number]  HPI  Mr. Lamm is a 50 year old man who presents for follow-up of his bilateral ankle, knee pain.   1) Chronic pain syndrome:  -His pain has been worsened since last visit given his uncle's death.    2) Shoulder pain, left: -he is unable to raise the shoulder above this head.  -He does not want   3) Obesity:  -Has gained 6 lbs since last visit -He has been cooking and with his brother.  -He has been doing well with sugar.   4) Insomnia:  -He has been sleeping well.   5) Bilateral ankle pain: -he has seen a podiatrist and had MRIs done. -he has flat foot.  -would like to be pulled out of work.   6) Emphysema: -struggles to breath when he is working in the heat.   7) Poor quality of life: -he feels he takes the medicine so he can work, he works all day, and then feels exhausted when he gets home.  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   Prior history: Since last visit he moved and that was quite a strain. He hurt his left shoulder on the 20th of last month. It is not getting better. He took a legal variety of CBD and was worried if this would come in his sample.   He has using ice pads on his ankle and knees. The Percocet has been helping.   BP 128/75 this visit!  He lost 12 lbs over the past 4 months. He has been drinking less sodas. He still drinks about three per day.   He looks more energetic and vibrant!     Pain Inventory Average Pain 9 Pain Right Now 8 My pain is constant, sharp, stabbing and tingling  In the last 24 hours, has pain interfered with the following? General activity 9 Relation with others 10 Enjoyment of life 10 What TIME of day is your pain at its worst? morning , daytime, evening and night Sleep (in general) Poor  Pain is worse with: walking, sitting and standing Pain improves with: rest and medication Relief from Meds:  8  Family History  Problem Relation Age of Onset  . Cancer Maternal Grandmother        breast, ovarian & colon  . Hypertension Mother   . Diabetes Mother   . Hypertension Father   . Heart disease Father   . Stroke Paternal Grandfather   . Neuropathy Neg Hx    Social History   Socioeconomic History  . Marital status: Divorced    Spouse name: Not on file  . Number of children: 2  . Years of education: 12th   . Highest education level: Not on file  Occupational History  . Occupation: [name]  Tobacco Use  . Smoking status: Current Every Day Smoker    Packs/day: 0.25    Years: 33.00    Pack years: 8.25    Types: Cigarettes  . Smokeless tobacco: Never Used  . Tobacco comment: 06/28/19 1/4 pack per day  Vaping Use  . Vaping Use: Former  . Substances: CBD  Substance and Sexual Activity  . Alcohol use: No    Alcohol/week: 0.0 standard drinks    Comment: Rarely  . Drug use: No  . Sexual activity: Yes    Birth control/protection: None  Other Topics Concern  . Not on file  Social History Narrative   Lives at home w/ his step-mother   Right-handed   Drinks about 2-3 [place] a day   Social Determinants of Health   Financial Resource Strain: Not on file  Food Insecurity: Not on file  Transportation Needs: Not on file  Physical Activity: Not on file  Stress: Not on file  Social Connections: Not on file   Past Surgical History:  Procedure Laterality Date  . Irrigation and debridement of back abscess    . MASS EXCISION Right 06/14/2019   Procedure: EXCISION BENIGN LESION RIGHT FOOT;  Surgeon: [name], DPM;  Location: [facility];  Service: Podiatry;  Laterality: Right;  . NASAL SEPTOPLASTY W/ TURBINOPLASTY [name] 02/03/2018   Procedure: NASAL SEPTOPLASTY WITH TURBINATE REDUCTION;  Surgeon: [name], MD;  Location: [facility];  Service: ENT;  Laterality: [name];  . PILONIDAL CYST DRAINAGE [name] 04/10/2017   Procedure: IRRIGATION AND DEBRIDEMENT BACK ABSCESS;  Surgeon: [name],  MD;  [facility];  Service: General;  Laterality: [name];  . SINUS ENDO WITH FUSION [name] 02/03/2018   Procedure: ENDOSCOPIC SINUS SURGERY WITH FUSION;  Surgeon: [name], MD;  Location: [facility];  Service: ENT;  Laterality: [name];  . THORACIC OUTLET SURGERY  2000  . TOE ARTHROPLASTY Right 06/14/2019   Procedure: HALLUX ARTHROPLASTY RIGHT FOOT WITH TISSUE TRANSER EXCISION OF SESAMOID BONE;  Surgeon: [name], DPM;  [facility];  Service: Podiatry;  Laterality: Right;  . WRIST FUSION  2002   Past Surgical History:  Procedure Laterality Date  . Irrigation and debridement of back abscess    . MASS EXCISION Right 06/14/2019   Procedure: EXCISION BENIGN LESION RIGHT FOOT;  Surgeon: [name], DPM;  [facility];  Service: Podiatry;  Laterality: Right;  . NASAL SEPTOPLASTY W/ TURBINOPLASTY [name] 02/03/2018   Procedure: NASAL SEPTOPLASTY WITH TURBINATE REDUCTION;  Surgeon: [name], MD;  Location: [facility];  Service: ENT;  Laterality: [name];  . PILONIDAL CYST DRAINAGE [name] 04/10/2017   Procedure: IRRIGATION AND DEBRIDEMENT BACK ABSCESS;  Surgeon: [name], MD;  [facility];  Service: General;  Laterality: [name];  . SINUS ENDO WITH FUSION [name] 02/03/2018   Procedure: ENDOSCOPIC SINUS SURGERY WITH FUSION;  Surgeon: [name], MD;  Location: [facility];  Service: ENT;  Laterality: [name];  . THORACIC OUTLET SURGERY  2000  . TOE ARTHROPLASTY Right 06/14/2019   Procedure: HALLUX ARTHROPLASTY RIGHT FOOT WITH TISSUE TRANSER EXCISION OF SESAMOID BONE;  Surgeon: [name], DPM;  [facility];  Service: Podiatry;  Laterality: Right;  . WRIST FUSION  2002   Past Medical History:  Diagnosis Date  . Anxiety   . Chronic back pain   . Degenerative disk disease   . Depression   . DVT of upper extremity (deep vein thrombosis) ([place])   . GERD (gastroesophageal reflux disease)   . Hypertension   . MRSA (methicillin resistant staph aureus) culture positive   . PAD (peripheral artery  disease) ([place])   . Peripheral neuropathy   . Pilonidal cyst   . Pulmonary embolus ([place])    no blood thinner 2002  . Sleep apnea    can't use cpap due to deviated nasal septumg   There were no vitals taken for this visit.  Opioid Risk Score:   Fall Risk Score:  `1  Depression screen PHQ 2/9  Depression screen [facility] 2/9 01/07/2021 10/02/2020 07/11/2020 06/05/2020 04/12/2020 04/05/2020 03/13/2020  Decreased Interest 2 1 0 1 2 1  1  Down, Depressed, Hopeless 1 1 0 1 1 1 1   PHQ - 2 Score 3 2 0 2 3 2 2   Altered sleeping 3 - 3 - 2 - -  Tired, decreased energy 3 - 3 - 3 - -  Change in appetite 2 - 1 - 3 - -  Feeling bad or failure about yourself  0 - 0 - 0 - -  Trouble concentrating 3 - 3 - 3 - -  Moving slowly or fidgety/restless 1 - 3 - 1 - -  Suicidal thoughts 0 - 0 - 0 - -  PHQ-9 Score 15 - 13 - 15 - -  Difficult doing work/chores - - - - Very difficult - -  Some recent data might be hidden   Review of Systems  Constitutional: Negative.   HENT: Negative.   Eyes: Negative.   Respiratory: Negative.   Cardiovascular: Positive for leg swelling.  Gastrointestinal: Negative.   Endocrine: Negative.   Genitourinary: Negative.   Musculoskeletal: Positive for back pain and gait problem.       Spasms  Skin: Negative.   Allergic/Immunologic: Negative.   Neurological: Positive for weakness and numbness.       Tingling  Hematological: Negative.   Psychiatric/Behavioral: Positive for dysphoric mood. The patient is nervous/anxious.   All other systems reviewed and are negative.      Objective:   Physical Exam Gen: no distress, normal appearing HEENT: oral mucosa pink and moist, NCAT Cardio: Reg rate Chest: normal effort, normal rate of breathing Abd: soft, non-distended Ext: no edema Psych: pleasant, normal affect Skin: intact Neuro: Alert and oriented x3 Musculoskeletal: Bilateral lower extremities edematous and tender to palpation at bilateral knees and ankles Range of motion  decreased to 60 degrees in abduction and elevation of left arm. Psych: pleasant, normal affect    Assessment & Plan:  1.Chronic idiopathic axonal polyneuropathy: Continue Lyrica.  2. Posterior tibial tendinitis of left lower extremity: Continue HEP as Tolerated. Continue to Monitor.  3. Lumbar DDD/ Lumbar Radiculitis: Continue current medication regime and HEP as Tolerated. -Discussed Qutenza as an option for neuropathic pain control. Discussed that this is a capsaicin patch, stronger than capsaicin cream. Discussed that it is currently approved for diabetic peripheral neuropathy and post-herpetic neuralgia, but that it has also shown benefit in treating other forms of neuropathy. Provided patient with link to site to learn more about the patch: CinemaBonus.fr. Discussed that the patch would be placed in office and benefits usually last 3 months. Discussed that unintended exposure to capsaicin can cause severe irritation of eyes, mucous membranes, respiratory tract, and skin, but that Qutenza is a local treatment and does not have the systemic side effects of other nerve medications. Discussed that there may be pain, itching, erythema, and decreased sensory function associated with the application of Qutenza. Side effects usually subside within 1 week. A cold pack of analgesic medications can help with these side effects. Blood pressure can also be increased due to pain associated with administration of the patch.  4. Chronic Pain Syndrome: Continue Percocet 7.5/325mg one tablet5times daily as needed for pain. This has resulted in improved pain control, especially when he has to work 10 hour long days.  -Discussed following foods that may reduce pain: 1) Ginger (especially studied for arthritis)- reduce leukotriene production to decrease inflammation 2) Blueberries- high in phytonutrients that decrease inflammation 3) Salmon- marine omega-3s reduce joint swelling and pain 4)  Pumpkin seeds- reduce inflammation 5) dark chocolate- reduces inflammation 6)  turmeric- reduces inflammation 7) tart cherries - reduce pain and stiffness 8) extra virgin olive oil - its compound olecanthal helps to block prostaglandins  9) chili peppers- can be eaten or applied topically via capsaicin 10) mint- helpful for headache, muscle aches, joint pain, and itching 11) garlic- reduces inflammation  Link to further information on diet for chronic pain: http://www.randall.com/   5. Obesity: Discussed good exercise and nutrition habits. Current weight is 361 lbs, down 6 lbs in the past 6 months! Made goal to decrease soda to down to twice per day. Monitor each visit.   6. Insomnia: Benefited from Amitriptyline 10mg  in the past. Does not require anymore- continues to be sleeping well!  7. Pilonidal cyst: Reviewed ID note. Recommended ID follow-up. Co-pay is expensive for patient and is the reason he could not follow-up. He cannot pursue surgery at this time due to cost. Patient has been taking medication for 9 months and tolerating it well. Discussed side effects of long term use of antibiotics. Prescribed DS Bactrim as previously ordered by ID. Plans to follow-up with ID by the end of the year. Referred to surgery for removal.   8) BP reviewed and better controlled this visit! Systolic is still slightly elevated.   9) Left sided adhesive capsulitis: Repeat steroid injection next visit.   All questions were encouraged and answered. Follow up with me in 2 months.

## 2021-02-15 LAB — TOXASSURE SELECT,+ANTIDEPR,UR

## 2021-02-17 ENCOUNTER — Telehealth: Payer: Self-pay | Admitting: *Deleted

## 2021-02-17 ENCOUNTER — Encounter: Payer: Self-pay | Admitting: *Deleted

## 2021-02-17 NOTE — Telephone Encounter (Signed)
Urine drug screen for this encounter is positive for low level THC as well as his oxycodone. Per Dr Aline August request, a formal warning letter has been sent through [place]  and through [facility] as well.

## 2021-02-24 ENCOUNTER — Other Ambulatory Visit: Payer: Self-pay | Admitting: Physical Medicine and Rehabilitation

## 2021-02-24 ENCOUNTER — Telehealth: Payer: Self-pay | Admitting: *Deleted

## 2021-02-24 MED ORDER — OXYCODONE-ACETAMINOPHEN 7.5-325 MG PO TABS: 1.0000 | ORAL_TABLET | Freq: Every day | ORAL | 0 refills | Status: DC | PRN

## 2021-02-24 NOTE — Telephone Encounter (Signed)
Mr Eduardo [name] called to request a refill on his oxycodone acetaminophen.  Per PMP last fill date was 01/28/21 and his next appt is 04/25/21.

## 2021-02-24 NOTE — Telephone Encounter (Signed)
Notified. 

## 2021-03-05 ENCOUNTER — Encounter: Payer: BC Managed Care – PPO | Admitting: Physician Assistant

## 2021-03-06 ENCOUNTER — Ambulatory Visit: Payer: BC Managed Care – PPO | Admitting: Pulmonary Disease

## 2021-03-06 ENCOUNTER — Other Ambulatory Visit: Payer: Self-pay

## 2021-03-06 ENCOUNTER — Encounter: Payer: Self-pay | Admitting: Pulmonary Disease

## 2021-03-06 DIAGNOSIS — G4733 Obstructive sleep apnea (adult) (pediatric): Secondary | ICD-10-CM | POA: Diagnosis not present

## 2021-03-06 DIAGNOSIS — F17200 Nicotine dependence, unspecified, uncomplicated: Secondary | ICD-10-CM

## 2021-03-06 DIAGNOSIS — Z9989 Dependence on other enabling machines and devices: Secondary | ICD-10-CM | POA: Diagnosis not present

## 2021-03-06 DIAGNOSIS — J432 Centrilobular emphysema: Secondary | ICD-10-CM | POA: Diagnosis not present

## 2021-03-06 MED ORDER — DOXYCYCLINE HYCLATE 100 MG PO TABS: 100.0000 mg | ORAL_TABLET | Freq: Two times a day (BID) | ORAL | 0 refills | Status: DC

## 2021-03-06 MED ORDER — PREDNISONE 10 MG PO TABS: ORAL_TABLET | ORAL | 0 refills | Status: AC

## 2021-03-06 MED ORDER — ALBUTEROL SULFATE (2.5 MG/3ML) 0.083% IN NEBU: 2.5000 mg | INHALATION_SOLUTION | Freq: Four times a day (QID) | RESPIRATORY_TRACT | 3 refills | Status: DC | PRN

## 2021-03-06 NOTE — Progress Notes (Signed)
Subjective:    Patient ID: Eduardo [name], male    DOB: December 30, 1970, 50 y.o.   MRN: [id number]  HPI  50 yo obese smoker with severe OSA presents for evaluation of shortness of breath and wheezing  PMH -pansinusitis, status post septoplasty Chronic opiate use  Gout   Chief Complaint  Patient presents with  . Follow-up    Reports shortness of breath with exertion for past 6 months.    He has been evaluated in the past for severe OSA but he presents today for shortness of breath and wheezing.  He continues to smoke about a pack per day.  He has gained 17 pounds since his last visit 2 years ago.  He works in the school system feeling passes.  He feels that the hot weather makes his breathing worse.  The fumes from the exhaust also makes his breathing worse.  He reports green sputum production and increased wheezing.  He reports pedal edema  I reviewed PCP notes and 3/28 he was prescribed Symbicort and albuterol which she has been using without significant relief   For OSA, we have tried multiple nasal and fullface interfaces in the past.  He is unable to tolerate the repeated split study 08/2019 which I reviewed.  He needs CPAP at 20 cm he has a machine at home but is unable to use.  He would like to explore alternative options  Reports significant stressors and has been unable to quit smoking   Significant tests/ events reviewed Split 08/2019  Severe, AHI 99/h >> CPAP 20 cm , Lg FF mask NPSG 11/2017 severe OSA with AHI of 83/h and was desaturation of 87%  Spirometry 04/2018  ratio of 83, and with FVC of 71% suggestive of mild restriction  LDCT 01/2021 emphysema   Past Medical History:  Diagnosis Date  . Anxiety   . Chronic back pain   . Degenerative disk disease   . Depression   . DVT of upper extremity (deep vein thrombosis) ([place])   . GERD (gastroesophageal reflux disease)   . Hypertension   . MRSA (methicillin resistant staph aureus) culture positive   . PAD  (peripheral artery disease) ([place])   . Peripheral neuropathy   . Pilonidal cyst   . Pulmonary embolus ([place])    no blood thinner 2002  . Sleep apnea    can't use cpap due to deviated nasal septumg    Past Surgical History:  Procedure Laterality Date  . Irrigation and debridement of back abscess    . MASS EXCISION Right 06/14/2019   Procedure: EXCISION BENIGN LESION RIGHT FOOT;  Surgeon: [name], DPM;  [facility];  Service: Podiatry;  Laterality: Right;  . NASAL SEPTOPLASTY W/ TURBINOPLASTY [name] 02/03/2018   Procedure: NASAL SEPTOPLASTY WITH TURBINATE REDUCTION;  Surgeon: [name], MD;  Location: [facility];  Service: ENT;  Laterality: [name];  . PILONIDAL CYST DRAINAGE [name] 04/10/2017   Procedure: IRRIGATION AND DEBRIDEMENT BACK ABSCESS;  Surgeon: [name], MD;  [facility];  Service: General;  Laterality: [name];  . SINUS ENDO WITH FUSION [name] 02/03/2018   Procedure: ENDOSCOPIC SINUS SURGERY WITH FUSION;  Surgeon: [name], MD;  Location: [facility];  Service: ENT;  Laterality: [name];  . THORACIC OUTLET SURGERY  2000  . TOE ARTHROPLASTY Right 06/14/2019   Procedure: HALLUX ARTHROPLASTY RIGHT FOOT WITH TISSUE TRANSER EXCISION OF SESAMOID BONE;  Surgeon: [name], DPM;  [facility];  Service: Podiatry;  Laterality: Right;  .  WRIST FUSION  2002    Allergies  Allergen Reactions  . Pollen Extract Cough  . Dust Mite Extract Other (See Comments)  . Other Other (See Comments)    Ragweed     Social History   Socioeconomic History  . Marital status: Divorced    Spouse name: Not on file  . Number of children: 2  . Years of education: 12th   . Highest education level: Not on file  Occupational History  . Occupation: [name]  Tobacco Use  . Smoking status: Current Every Day Smoker    Packs/day: 0.25    Years: 33.00    Pack years: 8.25    Types: Cigarettes  . Smokeless tobacco: Never Used  . Tobacco comment: 1/2 pack per day 03/06/2021  Vaping Use  . Vaping Use:  Former  Substance and Sexual Activity  . Alcohol use: No    Alcohol/week: 0.0 standard drinks    Comment: Rarely  . Drug use: No  . Sexual activity: Yes    Birth control/protection: None  Other Topics Concern  . Not on file  Social History Narrative   Lives at home w/ his step-mother   Right-handed   Drinks about 2-3 [place] a day   Social Determinants of Health   Financial Resource Strain: Not on file  Food Insecurity: Not on file  Transportation Needs: Not on file  Physical Activity: Not on file  Stress: Not on file  Social Connections: Not on file  Intimate Partner Violence: Not on file     Family History  Problem Relation Age of Onset  . Cancer Maternal Grandmother        breast, ovarian & colon  . Hypertension Mother   . Diabetes Mother   . Hypertension Father   . Heart disease Father   . Stroke Paternal Grandfather   . Neuropathy Neg Hx     Review of Systems  Constitutional: negative for anorexia, fevers and sweats  Eyes: negative for irritation, redness and visual disturbance  Ears, nose, mouth, throat, and face: negative for earaches, epistaxis, nasal congestion and sore throat  Respiratory: positive for cough, dyspnea on exertion, sputum and wheezing  Cardiovascular: negative for chest pain,, orthopnea, palpitations and syncope  Gastrointestinal: negative for abdominal pain, constipation, diarrhea, melena, nausea and vomiting  Genitourinary:negative for dysuria, frequency and hematuria  Hematologic/lymphatic: negative for bleeding, easy bruising and lymphadenopathy  Musculoskeletal:negative for arthralgias, muscle weakness +stiff joints  Neurological: negative for coordination problems, gait problems, headaches and weakness  Endocrine: negative for diabetic symptoms including polydipsia, polyuria and weight loss     Objective:   Physical Exam  Gen. Pleasant, obese, in no distress, normal affect ENT - no pallor,icterus, no post nasal drip, class 2-3  airway Neck: No JVD, no thyromegaly, no carotid bruits Lungs: no use of accessory muscles, no dullness to percussion, bilateral scattered rhonchi Cardiovascular: Rhythm regular, heart sounds  normal, no murmurs or gallops, 1+ peripheral edema Abdomen: soft and non-tender, no hepatosplenomegaly, BS normal. Musculoskeletal: No deformities, no cyanosis or clubbing Neuro:  alert, non focal, no tremors       Assessment & Plan:

## 2021-03-06 NOTE — Assessment & Plan Note (Signed)
He is unable to tolerate CPAP.  We have tried various interfaces in the past.  He has had at least 2 titration studies.  He is simply not able to tolerate and does not want to try anymore. I do not think he is a candidate for dental appliance given severity of OSA. I explained to him that due to his obesity he would not be a candidate for hypoglossal nerve stimulation since BMI of less than 32 is recommended.  He would still want to proceed with ENT referral.  He also has pansinusitis so we will proceed with referral

## 2021-03-06 NOTE — Patient Instructions (Addendum)
Prednisone 10 mg tabs Take 4 tabs  daily with food x 4 days, then 3 tabs daily x 4 days, then 2 tabs daily x 4 days, then 1 tab daily x4 days then stop. #40 Doxycycline 100 mg twice daily for 7 days.  Stay on Symbicort 2 puffs twice daily, rinse mouth after use.  Schedule PFTs, based on this we will decide if you need a different medication from Symbicort.  Prescription to DME for nebulizer and albuterol nebs every 6 hours as needed for emergency  You have to [place] smoking!   Refer to ENT for evaluation for inspire device

## 2021-03-06 NOTE — Assessment & Plan Note (Signed)
We will treat him for COPD exacerbation  Prednisone 10 mg tabs Take 4 tabs  daily with food x 4 days, then 3 tabs daily x 4 days, then 2 tabs daily x 4 days, then 1 tab daily x4 days then stop. #40 Doxycycline 100 mg twice daily for 7 days.  Stay on Symbicort 2 puffs twice daily, rinse mouth after use.  Schedule PFTs, based on this we will decide if you need a different medication from Symbicort.  Prescription to DME for nebulizer and albuterol nebs every 6 hours as needed for emergency

## 2021-03-06 NOTE — Assessment & Plan Note (Signed)
I again emphasized smoking cessation is the most important intervention for him.  He states he has too many stressors and was not willing to commit to a quit attempt.  We will continue this discussion and counseling

## 2021-03-07 ENCOUNTER — Other Ambulatory Visit: Payer: Self-pay | Admitting: Physical Medicine and Rehabilitation

## 2021-03-07 ENCOUNTER — Other Ambulatory Visit: Payer: Self-pay | Admitting: Physician Assistant

## 2021-03-07 DIAGNOSIS — G609 Hereditary and idiopathic neuropathy, unspecified: Secondary | ICD-10-CM

## 2021-03-07 DIAGNOSIS — J302 Other seasonal allergic rhinitis: Secondary | ICD-10-CM

## 2021-03-07 DIAGNOSIS — J3089 Other allergic rhinitis: Secondary | ICD-10-CM

## 2021-03-24 ENCOUNTER — Telehealth: Payer: Self-pay

## 2021-03-24 ENCOUNTER — Telehealth: Payer: Self-pay | Admitting: Physical Medicine and Rehabilitation

## 2021-03-24 NOTE — Telephone Encounter (Signed)
Mr.Mceuen is calling to get his oxycodone refilled per PMP last filled was 02/24/21 next appt is 04/25/21 . Call back number [phone]

## 2021-03-24 NOTE — Telephone Encounter (Signed)
Patient called and is out of pain (percocet ) needs refill

## 2021-03-25 ENCOUNTER — Other Ambulatory Visit: Payer: Self-pay | Admitting: Physical Medicine and Rehabilitation

## 2021-03-25 MED ORDER — OXYCODONE-ACETAMINOPHEN 7.5-325 MG PO TABS: 1.0000 | ORAL_TABLET | Freq: Every day | ORAL | 0 refills | Status: DC | PRN

## 2021-03-25 NOTE — Telephone Encounter (Signed)
Please let him know I have sent!

## 2021-04-15 ENCOUNTER — Encounter: Payer: BC Managed Care – PPO | Admitting: Physician Assistant

## 2021-04-22 ENCOUNTER — Other Ambulatory Visit: Payer: Self-pay | Admitting: Physical Medicine and Rehabilitation

## 2021-04-22 ENCOUNTER — Telehealth: Payer: Self-pay | Admitting: *Deleted

## 2021-04-22 MED ORDER — OXYCODONE-ACETAMINOPHEN 7.5-325 MG PO TABS: 1.0000 | ORAL_TABLET | Freq: Every day | ORAL | 0 refills | Status: DC | PRN

## 2021-04-22 NOTE — Telephone Encounter (Signed)
Eduardo [name] called to ask for a refill on his percocet.  His last fill date was 03/25/21  and his next appt is 04/25/21

## 2021-04-25 ENCOUNTER — Other Ambulatory Visit: Payer: Self-pay

## 2021-04-25 ENCOUNTER — Encounter: Payer: Self-pay | Admitting: Physical Medicine and Rehabilitation

## 2021-04-25 ENCOUNTER — Encounter
Payer: BC Managed Care – PPO | Attending: Physical Medicine & Rehabilitation | Admitting: Physical Medicine and Rehabilitation

## 2021-04-25 VITALS — BP 136/82 | HR 81 | Temp 98.4°F | Ht 77.0 in | Wt 352.4 lb

## 2021-04-25 DIAGNOSIS — G894 Chronic pain syndrome: Secondary | ICD-10-CM | POA: Insufficient documentation

## 2021-04-25 DIAGNOSIS — Z79891 Long term (current) use of opiate analgesic: Secondary | ICD-10-CM | POA: Insufficient documentation

## 2021-04-25 DIAGNOSIS — Z5181 Encounter for therapeutic drug level monitoring: Secondary | ICD-10-CM | POA: Diagnosis present

## 2021-04-25 NOTE — Progress Notes (Addendum)
Subjective:    Patient ID: Eduardo [name], male    DOB: February 03, 1971, 50 y.o.   MRN: [id number]  HPI  Eduardo [name] is a 50 year old man who presents for follow-up of his bilateral ankle, knee pain.   1) Chronic pain syndrome:  -His pain has been worsened since last visit given his uncle's death.    2) Shoulder pain, left: -he is unable to raise the shoulder above this head.  -He does not want   3) Obesity:  -Has gained 6 lbs since last visit -He has been cooking and with his brother.  -He has been doing well with sugar.   4) Insomnia:  -He has been sleeping well.   5) Bilateral ankle pain: -he has seen a podiatrist and had MRIs done. -he has flat foot.  -would like to be pulled out of work.   6) Emphysema: -struggles to breath when he is working in the heat.   7) Poor quality of life: -he feels he takes the medicine so he can work, he works all day, and then feels exhausted when he gets home.  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  -he sometimes wonders what is the point of his life.  -he wishes he could stop working and spend more time with his grandkids, and help his daughters  70) Peripheral neuropathy -would like to try Qutenza today -has decreased sensation on soles of both feet -pain is present both feet and ankes, less so on dorsum of feet  Prior history: Since last visit he moved and that was quite a strain. He hurt his left shoulder on the 20th of last month. It is not getting better. He took a legal variety of CBD and was worried if this would come in his sample.   He has using ice pads on his ankle and knees. The Percocet has been helping.   BP 128/75 this visit!  He lost 12 lbs over the past 4 months. He has been drinking less sodas. He still drinks about three per day.   He looks more energetic and vibrant!     Pain Inventory Average Pain 9 Pain Right Now 8 My pain is constant, sharp, stabbing and tingling  In the last 24 hours, has pain  interfered with the following? General activity 9 Relation with others 10 Enjoyment of life 10 What TIME of day is your pain at its worst? morning , daytime, evening and night Sleep (in general) Poor  Pain is worse with: walking, sitting and standing Pain improves with: rest and medication Relief from Meds: 8  Family History  Problem Relation Age of Onset   Cancer Maternal Grandmother        breast, ovarian & colon   Hypertension Mother    Diabetes Mother    Hypertension Father    Heart disease Father    Stroke Paternal Grandfather    Neuropathy Neg Hx    Social History   Socioeconomic History   Marital status: Divorced    Spouse name: Not on file   Number of children: 2   Years of education: 12th    Highest education level: Not on file  Occupational History   Occupation: [name]  Tobacco Use   Smoking status: Every Day    Packs/day: 0.25    Years: 33.00    Pack years: 8.25    Types: Cigarettes   Smokeless tobacco: Never   Tobacco comments:    1/2 pack per day 03/06/2021  Vaping Use   Vaping Use: Former  Substance and Sexual Activity   Alcohol use: No    Alcohol/week: 0.0 standard drinks    Comment: Rarely   Drug use: No   Sexual activity: Yes    Birth control/protection: None  Other Topics Concern   Not on file  Social History Narrative   Lives at home w/ his step-mother   Right-handed   Drinks about 2-3 [place] a day   Social Determinants of Health   Financial Resource Strain: Not on file  Food Insecurity: Not on file  Transportation Needs: Not on file  Physical Activity: Not on file  Stress: Not on file  Social Connections: Not on file   Past Surgical History:  Procedure Laterality Date   Irrigation and debridement of back abscess     MASS EXCISION Right 06/14/2019   Procedure: EXCISION BENIGN LESION RIGHT FOOT;  Surgeon: [name], DPM;  Location: [facility];  Service: Podiatry;  Laterality: Right;   NASAL SEPTOPLASTY W/ TURBINOPLASTY [name]  02/03/2018   Procedure: NASAL SEPTOPLASTY WITH TURBINATE REDUCTION;  Surgeon: [name], MD;  Location: [place];  Service: ENT;  Laterality: [name];   PILONIDAL CYST DRAINAGE [name] 04/10/2017   Procedure: IRRIGATION AND DEBRIDEMENT BACK ABSCESS;  Surgeon: [name], MD;  [facility];  Service: General;  Laterality: [name];   SINUS ENDO WITH FUSION [name] 02/03/2018   Procedure: ENDOSCOPIC SINUS SURGERY WITH FUSION;  Surgeon: [name], MD;  Location: [place];  Service: ENT;  Laterality: [name];   THORACIC OUTLET SURGERY  2000   TOE ARTHROPLASTY Right 06/14/2019   Procedure: HALLUX ARTHROPLASTY RIGHT FOOT WITH TISSUE TRANSER EXCISION OF SESAMOID BONE;  Surgeon: [name], DPM;  [facility];  Service: Podiatry;  Laterality: Right;   WRIST FUSION  2002   Past Surgical History:  Procedure Laterality Date   Irrigation and debridement of back abscess     MASS EXCISION Right 06/14/2019   Procedure: EXCISION BENIGN LESION RIGHT FOOT;  Surgeon: [name], DPM;  [facility];  Service: Podiatry;  Laterality: Right;   NASAL SEPTOPLASTY W/ TURBINOPLASTY [name] 02/03/2018   Procedure: NASAL SEPTOPLASTY WITH TURBINATE REDUCTION;  Surgeon: [name], MD;  Location: [place];  Service: ENT;  Laterality: [name];   PILONIDAL CYST DRAINAGE [name] 04/10/2017   Procedure: IRRIGATION AND DEBRIDEMENT BACK ABSCESS;  Surgeon: [name], MD;  [facility];  Service: General;  Laterality: [name];   SINUS ENDO WITH FUSION [name] 02/03/2018   Procedure: ENDOSCOPIC SINUS SURGERY WITH FUSION;  Surgeon: [name], MD;  Location: [place];  Service: ENT;  Laterality: [name];   THORACIC OUTLET SURGERY  2000   TOE ARTHROPLASTY Right 06/14/2019   Procedure: HALLUX ARTHROPLASTY RIGHT FOOT WITH TISSUE TRANSER EXCISION OF SESAMOID BONE;  Surgeon: [name], DPM;  [facility];  Service: Podiatry;  Laterality: Right;   WRIST FUSION  2002   Past Medical History:  Diagnosis Date   Anxiety    Chronic back pain     Degenerative disk disease    Depression    DVT of upper extremity (deep vein thrombosis) (HCC)    GERD (gastroesophageal reflux disease)    Hypertension    MRSA (methicillin resistant staph aureus) culture positive    PAD (peripheral artery disease) (HCC)    Peripheral neuropathy    Pilonidal cyst    Pulmonary embolus ([place])    no blood thinner 2002   Sleep apnea  can't use cpap due to deviated nasal septumg   BP 136/82   Pulse 81   Temp 98.4 F (36.9 C)   Ht 6\' 5"  (1.956 m)   Wt (!) 352 lb 6.4 oz (159.8 kg)   SpO2 96%   BMI 41.79 kg/m   Opioid Risk Score:   Fall Risk Score:  `1  Depression screen PHQ 2/9  Depression screen [facility] 2/9 04/25/2021 02/07/2021 01/07/2021 10/02/2020 07/11/2020 06/05/2020 04/12/2020  Decreased Interest 1 1 2 1  0 1 2  Down, Depressed, Hopeless 1 1 1 1  0 1 1  PHQ - 2 Score 2 2 3 2  0 2 3  Altered sleeping - - 3 - 3 - 2  Tired, decreased energy - - 3 - 3 - 3  Change in appetite - - 2 - 1 - 3  Feeling bad or failure about yourself  - - 0 - 0 - 0  Trouble concentrating - - 3 - 3 - 3  Moving slowly or fidgety/restless - - 1 - 3 - 1  Suicidal thoughts - - 0 - 0 - 0  PHQ-9 Score - - 15 - 13 - 15  Difficult doing work/chores - - - - - - Very difficult  Some recent data might be hidden   Review of Systems     Objective:   Physical Exam Gen: no distress, normal appearing HEENT: oral mucosa pink and moist, NCAT Cardio: Reg rate Chest: normal effort, normal rate of breathing Abd: soft, non-distended Ext: no edema Psych: pleasant, normal affect Skin: intact Neuro: Alert and oriented x3 Musculoskeletal: Bilateral lower extremities edematous and tender to palpation at bilateral knees and ankles Range of motion decreased to 60 degrees in abduction and elevation of left arm. Psych: pleasant, normal affect    Assessment & Plan:  1. Chronic idiopathic axonal polyneuropathy: Continue Lyrica.   2. Posterior tibial tendinitis of left lower extremity:  Continue HEP as Tolerated. Continue to Monitor.    3. Lumbar DDD/ Lumbar Radiculitis: Continue current medication regime and HEP as Tolerated.  -Discussed Qutenza as an option for neuropathic pain control. Discussed that this is a capsaicin patch, stronger than capsaicin cream. Discussed that it is currently approved for diabetic peripheral neuropathy and post-herpetic neuralgia, but that it has also shown benefit in treating other forms of neuropathy. Provided patient with link to site to learn more about the patch: CinemaBonus.fr. Discussed that the patch would be placed in office and benefits usually last 3 months. Discussed that unintended exposure to capsaicin can cause severe irritation of eyes, mucous membranes, respiratory tract, and skin, but that Qutenza is a local treatment and does not have the systemic side effects of other nerve medications. Discussed that there may be pain, itching, erythema, and decreased sensory function associated with the application of Qutenza. Side effects usually subside within 1 week. A cold pack of analgesic medications can help with these side effects. Blood pressure can also be increased due to pain associated with administration of the patch.   4. Chronic Pain Syndrome:  Continue Percocet 7.5/325mg  one tablet 5 times daily as needed for pain. This has resulted in improved pain control, especially when he has to work 10 hour long days.  -Discussed following foods that may reduce pain: 1) Ginger (especially studied for arthritis)- reduce leukotriene production to decrease inflammation 2) Blueberries- high in phytonutrients that decrease inflammation 3) Salmon- marine omega-3s reduce joint swelling and pain 4) Pumpkin seeds- reduce inflammation 5) dark chocolate- reduces inflammation 6)  turmeric- reduces inflammation 7) tart cherries - reduce pain and stiffness 8) extra virgin olive oil - its compound olecanthal helps to block prostaglandins  9) chili  peppers- can be eaten or applied topically via capsaicin 10) mint- helpful for headache, muscle aches, joint pain, and itching 11) garlic- reduces inflammation  Link to further information on diet for chronic pain: http://www.randall.com/   5. Obesity: Discussed good exercise and nutrition habits. Current weight is 361 lbs, down 6 lbs in the past 6 months! Made goal to decrease soda to down to twice per day. Monitor each visit.    6. Insomnia: Benefited from Amitriptyline 10mg  in the past. Does not require anymore- continues to be sleeping well!   7. Pilonidal cyst: Reviewed ID note. Recommended ID follow-up. Co-pay is expensive for patient and is the reason he could not follow-up. He cannot pursue surgery at this time due to cost. Patient has been taking medication for 9 months and tolerating it well. Discussed side effects of long term use of antibiotics. Prescribed DS Bactrim as previously ordered by ID. Plans to follow-up with ID by the end of the year. Referred to surgery for removal.   8) BP reviewed and better controlled this visit! Systolic is still slightly elevated.   9) Left sided adhesive capsulitis: Repeat steroid injection next visit.   10) Peripheral neuropathy:  -Discussed Qutenza as an option for neuropathic pain control. Discussed that this is a capsaicin patch, stronger than capsaicin cream. Discussed that it is currently approved for diabetic peripheral neuropathy and post-herpetic neuralgia, but that it has also shown benefit in treating other forms of neuropathy. Provided patient with link to site to learn more about the patch: CinemaBonus.fr. Discussed that the patch would be placed in office and benefits usually last 3 months. Discussed that unintended exposure to capsaicin can cause severe irritation of eyes, mucous membranes, respiratory tract, and skin, but that Qutenza is a local treatment and  does not have the systemic side effects of other nerve medications. Discussed that there may be pain, itching, erythema, and decreased sensory function associated with the application of Qutenza. Side effects usually subside within 1 week. A cold pack of analgesic medications can help with these side effects. Blood pressure can also be increased due to pain associated with administration of the patch.  4 patches of Qutenza was applied to the area of pain. Ice packs were applied during the procedure to ensure patient comfort. Blood pressure was monitored every 15 minutes. The patient tolerated the procedure well. Post-procedure instructions were given and follow-up has been scheduled.  [facility] [id number]   All questions were encouraged and answered.  Follow up with me in 3 months for repeat Qutenza application.   40 minutes spent in discussing benefits of Qutenza, possible side effects, application of Qutenza and ice packs, expected onset of benefits, impaired quality of life due to pain and decreasing ability to work, depression regarding current quality of life, his desire to spend more time with his grandchildren and to help his daughter

## 2021-05-05 ENCOUNTER — Other Ambulatory Visit: Payer: Self-pay | Admitting: Physical Medicine and Rehabilitation

## 2021-05-05 DIAGNOSIS — G609 Hereditary and idiopathic neuropathy, unspecified: Secondary | ICD-10-CM

## 2021-05-05 MED ORDER — PREGABALIN 300 MG PO CAPS: 300.0000 mg | ORAL_CAPSULE | Freq: Two times a day (BID) | ORAL | 2 refills | Status: DC

## 2021-05-13 ENCOUNTER — Other Ambulatory Visit: Payer: Self-pay | Admitting: Physical Medicine and Rehabilitation

## 2021-05-13 ENCOUNTER — Telehealth: Payer: Self-pay | Admitting: Registered Nurse

## 2021-05-13 MED ORDER — OXYCODONE-ACETAMINOPHEN 7.5-325 MG PO TABS: 1.0000 | ORAL_TABLET | Freq: Every day | ORAL | 0 refills | Status: DC | PRN

## 2021-05-13 NOTE — Telephone Encounter (Signed)
P called to report he will be out of RX next week - please return call [phone]

## 2021-06-03 ENCOUNTER — Other Ambulatory Visit (HOSPITAL_COMMUNITY): Payer: BC Managed Care – PPO

## 2021-06-06 ENCOUNTER — Ambulatory Visit: Payer: BC Managed Care – PPO | Admitting: Adult Health

## 2021-06-06 ENCOUNTER — Other Ambulatory Visit: Payer: Self-pay | Admitting: Physical Medicine and Rehabilitation

## 2021-06-06 ENCOUNTER — Ambulatory Visit: Payer: BC Managed Care – PPO | Admitting: Primary Care

## 2021-06-09 ENCOUNTER — Other Ambulatory Visit: Payer: Self-pay

## 2021-06-09 ENCOUNTER — Ambulatory Visit: Payer: BC Managed Care – PPO

## 2021-06-09 ENCOUNTER — Ambulatory Visit: Payer: BC Managed Care – PPO | Admitting: Adult Health

## 2021-06-10 ENCOUNTER — Other Ambulatory Visit: Payer: Self-pay

## 2021-06-10 MED ORDER — OXYCODONE-ACETAMINOPHEN 7.5-325 MG PO TABS: 1.0000 | ORAL_TABLET | Freq: Every day | ORAL | 0 refills | Status: DC | PRN

## 2021-06-10 NOTE — Telephone Encounter (Signed)
Eduardo [name] Calling about refill on his Oxycodone per pmp last filled was 05/13/2021 . Has an upcoming appt in September with Danella Sensing NP.

## 2021-06-12 ENCOUNTER — Telehealth: Payer: Self-pay | Admitting: *Deleted

## 2021-06-12 NOTE — Telephone Encounter (Signed)
Eduardo [name] KeyGarey Ham  PA Case [id number] Rx #[id number]   oxyCODONE-Acetaminophen 7.5-'325MG'$  tablets  Sent to [facility]

## 2021-06-13 NOTE — Telephone Encounter (Signed)
Approval received 06/12/21- 12/13/21.

## 2021-06-27 ENCOUNTER — Other Ambulatory Visit: Payer: Self-pay

## 2021-06-27 ENCOUNTER — Encounter: Payer: Self-pay | Admitting: Registered Nurse

## 2021-06-27 ENCOUNTER — Encounter: Payer: BC Managed Care – PPO | Attending: Physical Medicine & Rehabilitation | Admitting: Registered Nurse

## 2021-06-27 ENCOUNTER — Telehealth: Payer: Self-pay | Admitting: Registered Nurse

## 2021-06-27 VITALS — BP 143/88 | HR 82 | Temp 98.7°F | Ht 77.0 in | Wt 355.4 lb

## 2021-06-27 DIAGNOSIS — Z79891 Long term (current) use of opiate analgesic: Secondary | ICD-10-CM | POA: Diagnosis not present

## 2021-06-27 DIAGNOSIS — M5136 Other intervertebral disc degeneration, lumbar region: Secondary | ICD-10-CM | POA: Diagnosis not present

## 2021-06-27 DIAGNOSIS — M79672 Pain in left foot: Secondary | ICD-10-CM | POA: Diagnosis present

## 2021-06-27 DIAGNOSIS — G609 Hereditary and idiopathic neuropathy, unspecified: Secondary | ICD-10-CM | POA: Diagnosis present

## 2021-06-27 DIAGNOSIS — M25572 Pain in left ankle and joints of left foot: Secondary | ICD-10-CM

## 2021-06-27 DIAGNOSIS — Z5181 Encounter for therapeutic drug level monitoring: Secondary | ICD-10-CM

## 2021-06-27 DIAGNOSIS — M5416 Radiculopathy, lumbar region: Secondary | ICD-10-CM | POA: Diagnosis present

## 2021-06-27 DIAGNOSIS — G8929 Other chronic pain: Secondary | ICD-10-CM

## 2021-06-27 DIAGNOSIS — G894 Chronic pain syndrome: Secondary | ICD-10-CM

## 2021-06-27 MED ORDER — OXYCODONE-ACETAMINOPHEN 7.5-325 MG PO TABS: 1.0000 | ORAL_TABLET | Freq: Every day | ORAL | 0 refills | Status: DC | PRN

## 2021-06-27 NOTE — Progress Notes (Signed)
Subjective:    Patient ID: Eduardo [name], male    DOB: [birth year], 50 y.o.   MRN: [id number]  HPI: Eduardo [name] is a 50 y.o. male who returns for follow up appointment for chronic pain and medication refill. He states his  pain is located in his lower back radiating into his left lower extremity, left ankle and left foot pain. Also reports increase intensity and frequency of neuropathic pain. He states he is compliant with his Lyrica. We will continue to monitor. He is scheduled for Qutenza with Dr [name] in November/2022, he is aware. [name] He rates his pain 8. His  current exercise regime is walking.  Eduardo [name] Morphine equivalent is 56.25 MME.   UDS ordered today.  Eduardo [name] is short on his medication by 20 pills, narcotic contract was reviewed, he was instructed to take his medication as prescribed. He states " the medication doesn't relieve his pain", he was instructed not to self medicate. This will be discussed with Dr [name], he verbalizes understanding.      Pain Inventory Average Pain 9 Pain Right Now 8 My pain is constant, sharp, burning, and stabbing  In the last 24 hours, has pain interfered with the following? General activity 10 Relation with others 9 Enjoyment of life 10 What TIME of day is your pain at its worst? morning , daytime, evening, and night Sleep (in general) Poor  Pain is worse with: walking, sitting, inactivity, and standing Pain improves with: rest, heat/ice, and medication Relief from Meds: 6  Family History  Problem Relation Age of Onset   Cancer Maternal Grandmother        breast, ovarian & colon   Hypertension Mother    Diabetes Mother    Hypertension Father    Heart disease Father    Stroke Paternal Grandfather    Neuropathy Neg Hx    Social History   Socioeconomic History   Marital status: Divorced    Spouse name: Not on file   Number of children: 2   Years of education: 12th    Highest education level: Not on file   Occupational History   Occupation: [name]  Tobacco Use   Smoking status: Every Day    Packs/day: 0.25    Years: 33.00    Pack years: 8.25    Types: Cigarettes   Smokeless tobacco: Never   Tobacco comments:    1/2 pack per day 03/06/2021  Vaping Use   Vaping Use: Former  Substance and Sexual Activity   Alcohol use: No    Alcohol/week: 0.0 standard drinks    Comment: Rarely   Drug use: No   Sexual activity: Yes    Birth control/protection: None  Other Topics Concern   Not on file  Social History Narrative   Lives at home w/ his step-mother   Right-handed   Drinks about 2-3 [place] a day   Social Determinants of Health   Financial Resource Strain: Not on file  Food Insecurity: Not on file  Transportation Needs: Not on file  Physical Activity: Not on file  Stress: Not on file  Social Connections: Not on file   Past Surgical History:  Procedure Laterality Date   Irrigation and debridement of back abscess     MASS EXCISION Right 06/14/2019   Procedure: EXCISION BENIGN LESION RIGHT FOOT;  Surgeon: [name], DPM;  [facility];  Service: Podiatry;  Laterality: Right;   NASAL SEPTOPLASTY W/ TURBINOPLASTY [name] 02/03/2018  Procedure: NASAL SEPTOPLASTY WITH TURBINATE REDUCTION;  Surgeon: [name], MD;  Location: [place];  Service: ENT;  Laterality: [name];   PILONIDAL CYST DRAINAGE [name] 04/10/2017   Procedure: IRRIGATION AND DEBRIDEMENT BACK ABSCESS;  Surgeon: [name], MD;  [facility];  Service: General;  Laterality: [name];   SINUS ENDO WITH FUSION [name] 02/03/2018   Procedure: ENDOSCOPIC SINUS SURGERY WITH FUSION;  Surgeon: [name], MD;  Location: [place];  Service: ENT;  Laterality: [name];   THORACIC OUTLET SURGERY  2000   TOE ARTHROPLASTY Right 06/14/2019   Procedure: HALLUX ARTHROPLASTY RIGHT FOOT WITH TISSUE TRANSER EXCISION OF SESAMOID BONE;  Surgeon: [name], DPM;  [facility];  Service: Podiatry;  Laterality: Right;   WRIST FUSION  2002   Past  Surgical History:  Procedure Laterality Date   Irrigation and debridement of back abscess     MASS EXCISION Right 06/14/2019   Procedure: EXCISION BENIGN LESION RIGHT FOOT;  Surgeon: [name], DPM;  [facility];  Service: Podiatry;  Laterality: Right;   NASAL SEPTOPLASTY W/ TURBINOPLASTY [name] 02/03/2018   Procedure: NASAL SEPTOPLASTY WITH TURBINATE REDUCTION;  Surgeon: [name], MD;  Location: [place];  Service: ENT;  Laterality: [name];   PILONIDAL CYST DRAINAGE [name] 04/10/2017   Procedure: IRRIGATION AND DEBRIDEMENT BACK ABSCESS;  Surgeon: [name], MD;  [facility];  Service: General;  Laterality: [name];   SINUS ENDO WITH FUSION [name] 02/03/2018   Procedure: ENDOSCOPIC SINUS SURGERY WITH FUSION;  Surgeon: [name], MD;  Location: [place];  Service: ENT;  Laterality: [name];   THORACIC OUTLET SURGERY  2000   TOE ARTHROPLASTY Right 06/14/2019   Procedure: HALLUX ARTHROPLASTY RIGHT FOOT WITH TISSUE TRANSER EXCISION OF SESAMOID BONE;  Surgeon: [name], DPM;  [facility];  Service: Podiatry;  Laterality: Right;   WRIST FUSION  2002   Past Medical History:  Diagnosis Date   Anxiety    Chronic back pain    Degenerative disk disease    Depression    DVT of upper extremity (deep vein thrombosis) (HCC)    GERD (gastroesophageal reflux disease)    Hypertension    MRSA (methicillin resistant staph aureus) culture positive    PAD (peripheral artery disease) (HCC)    Peripheral neuropathy    Pilonidal cyst    Pulmonary embolus (HCC)    no blood thinner 2002   Sleep apnea    can't use cpap due to deviated nasal septumg   BP (!) 143/88   Pulse 82   Temp 98.7 F (37.1 C)   Ht '6\' 5"'$  (1.956 m)   Wt (!) 355 lb 6.4 oz (161.2 kg)   SpO2 97%   BMI 42.14 kg/m   Opioid Risk Score:   Fall Risk Score:  `1  Depression screen PHQ 2/9  Depression screen [facility] 2/9 06/27/2021 04/25/2021 02/07/2021 01/07/2021 10/02/2020 07/11/2020 06/05/2020  Decreased Interest '1 1 1 2 1 '$ 0 1  Down,  Depressed, Hopeless '1 1 1 1 1 '$ 0 1  PHQ - 2 Score '2 2 2 3 2 '$ 0 2  Altered sleeping - - - 3 - 3 -  Tired, decreased energy - - - 3 - 3 -  Change in appetite - - - 2 - 1 -  Feeling bad or failure about yourself  - - - 0 - 0 -  Trouble concentrating - - - 3 - 3 -  Moving slowly or fidgety/restless - - - 1 - 3 -  Suicidal thoughts - - - 0 - 0 -  PHQ-9 Score - - - 15 - 13 -  Difficult doing work/chores - - - - - - -  Some recent data might be hidden    Review of Systems  Constitutional: Negative.   HENT: Negative.    Eyes: Negative.   Respiratory: Negative.    Cardiovascular: Negative.   Gastrointestinal: Negative.   Endocrine: Negative.   Genitourinary: Negative.   Musculoskeletal:  Positive for back pain.       Left shoulder and hand, right hand  Skin: Negative.   Neurological:        Neuropathy in his feet, "pain is bad"/ having difficulty walking barefoot  Hematological: Negative.   Psychiatric/Behavioral:  Positive for dysphoric mood.   All other systems reviewed and are negative.     Objective:   Physical Exam Vitals and nursing note reviewed.  Constitutional:      Appearance: Normal appearance.  Cardiovascular:     Rate and Rhythm: Normal rate and regular rhythm.     Pulses: Normal pulses.     Heart sounds: Normal heart sounds.  Pulmonary:     Effort: Pulmonary effort is normal.     Breath sounds: Normal breath sounds.  Musculoskeletal:     Cervical back: Normal range of motion and neck supple.     Comments: Normal Muscle Bulk and Muscle Testing Reveals:  Upper Extremities: Full ROM and Muscle Strength 5/5 Lumbar Paraspinal Tenderness: L-3-L-5 Lower Extremities: Full ROM and Muscle Strength 5/5 Arises from Table with ease Narrow Based  Gait     Skin:    General: Skin is warm and dry.  Neurological:     Mental Status: He is alert and oriented to person, place, and time.  Psychiatric:        Mood and Affect: Mood normal.        Behavior: Behavior normal.          Assessment & Plan:  Left Lumbar Radiculitis: Continue Lyrica. Continue HEP as Tolerated. Continue to Monitor.  2. Chronic idiopathic axonal polyneuropathy: He is scheduled for Qutenza in November with Dr [name]. Continue Lyrica. 06/27/2020 3. Posterior tibial tendinitis of left lower extremity: Continue HEP as Tolerated. Continue to Monitor. 06/27/2020 4. Lumbar DDD/ Lumbar Radiculitis: Continue current medication regime and HEP as Tolerated. 06/27/2020. 5. Chronic Pain Syndrome: Refilled : Oxycodone 7.5/'325mg'$  one tablet 5 times daily as needed for pain. #150. We will continue the opioid monitoring program, this consists of regular clinic visits, examinations, urine drug screen, pill counts as well as use of [place] Controlled Substance Reporting system.  6. Left Foot and Left Ankle Pain: Continue HEP as Tolerated. Continue current medication regimen. Continue to monitor.   F/U in 1 month

## 2021-06-27 NOTE — Telephone Encounter (Signed)
Dr [name] I seen [name] good today, he was short on his medication 20 pills. He states the medication doesn't work. I told him I would refer his request to you. He also asked if you would order him  a left ankle brace.

## 2021-06-30 ENCOUNTER — Telehealth: Payer: Self-pay

## 2021-06-30 NOTE — Telephone Encounter (Signed)
Patient is responding to the letter about over using medication. He states he takes 6 tablets of Percocet 7.5/325 mg to help him function. He states his pain is so severe in his ankles that it feels like someone is taking a sedge hammer and hitting them. He states he does mention every visit that his pain has increased. Please advise.

## 2021-06-30 NOTE — Telephone Encounter (Signed)
RX for left ankle brace faxed with clinic note to [facility].

## 2021-06-30 NOTE — Telephone Encounter (Signed)
Patient has been notified

## 2021-06-30 NOTE — Telephone Encounter (Signed)
Formal warning letter sent to Eduardo [name] for increasing medication without permission. It was sent via [place] and [place].

## 2021-07-03 ENCOUNTER — Other Ambulatory Visit: Payer: Self-pay

## 2021-07-04 ENCOUNTER — Other Ambulatory Visit: Payer: Self-pay

## 2021-07-04 DIAGNOSIS — M25532 Pain in left wrist: Secondary | ICD-10-CM | POA: Insufficient documentation

## 2021-07-04 DIAGNOSIS — M25572 Pain in left ankle and joints of left foot: Secondary | ICD-10-CM | POA: Insufficient documentation

## 2021-07-04 LAB — TOXASSURE SELECT,+ANTIDEPR,UR

## 2021-07-07 ENCOUNTER — Other Ambulatory Visit: Payer: Self-pay | Admitting: Medical

## 2021-07-07 DIAGNOSIS — M545 Low back pain, unspecified: Secondary | ICD-10-CM

## 2021-07-08 ENCOUNTER — Telehealth: Payer: Self-pay | Admitting: *Deleted

## 2021-07-08 NOTE — Telephone Encounter (Signed)
Urine drug screen for this encounter is consistent for prescribed medication 

## 2021-07-09 DIAGNOSIS — S93409A Sprain of unspecified ligament of unspecified ankle, initial encounter: Secondary | ICD-10-CM | POA: Insufficient documentation

## 2021-07-14 ENCOUNTER — Ambulatory Visit: Payer: BC Managed Care – PPO | Admitting: Adult Health

## 2021-07-15 ENCOUNTER — Ambulatory Visit
Admission: RE | Admit: 2021-07-15 | Discharge: 2021-07-15 | Disposition: A | Discharge status: Home / Self Care | Payer: BC Managed Care – PPO | Source: Ambulatory Visit | Attending: Medical | Admitting: Medical

## 2021-07-15 ENCOUNTER — Other Ambulatory Visit: Payer: Self-pay

## 2021-07-15 DIAGNOSIS — M545 Low back pain, unspecified: Secondary | ICD-10-CM

## 2021-07-18 ENCOUNTER — Ambulatory Visit: Payer: BC Managed Care – PPO | Admitting: Primary Care

## 2021-07-19 ENCOUNTER — Other Ambulatory Visit: Payer: BC Managed Care – PPO

## 2021-07-30 ENCOUNTER — Ambulatory Visit: Payer: BC Managed Care – PPO | Admitting: Registered Nurse

## 2021-08-04 ENCOUNTER — Other Ambulatory Visit: Payer: Self-pay | Admitting: Physical Medicine and Rehabilitation

## 2021-08-04 ENCOUNTER — Other Ambulatory Visit: Payer: Self-pay | Admitting: Specialist

## 2021-08-04 ENCOUNTER — Other Ambulatory Visit: Payer: Self-pay

## 2021-08-04 DIAGNOSIS — M5451 Vertebrogenic low back pain: Secondary | ICD-10-CM

## 2021-08-04 DIAGNOSIS — G609 Hereditary and idiopathic neuropathy, unspecified: Secondary | ICD-10-CM

## 2021-08-05 ENCOUNTER — Ambulatory Visit: Payer: BC Managed Care – PPO | Admitting: Primary Care

## 2021-08-05 MED ORDER — OXYCODONE-ACETAMINOPHEN 7.5-325 MG PO TABS: 1.0000 | ORAL_TABLET | Freq: Every day | ORAL | 0 refills | Status: DC | PRN

## 2021-08-07 ENCOUNTER — Telehealth: Payer: Self-pay

## 2021-08-07 DIAGNOSIS — M7660 Achilles tendinitis, unspecified leg: Secondary | ICD-10-CM | POA: Insufficient documentation

## 2021-08-07 NOTE — Telephone Encounter (Signed)
Patient called to request refill on Percocet and Lyrica. Left voicemail to inform patient that both medications were sent to the pharmacy on 08/05/21.

## 2021-08-08 ENCOUNTER — Other Ambulatory Visit: Payer: Self-pay

## 2021-08-08 ENCOUNTER — Ambulatory Visit
Admission: RE | Admit: 2021-08-08 | Discharge: 2021-08-08 | Disposition: A | Discharge status: Home / Self Care | Payer: BC Managed Care – PPO | Source: Ambulatory Visit | Attending: Specialist | Admitting: Specialist

## 2021-08-08 DIAGNOSIS — M5451 Vertebrogenic low back pain: Secondary | ICD-10-CM

## 2021-08-08 MED ORDER — METHYLPREDNISOLONE ACETATE 40 MG/ML INJ SUSP (RADIOLOG
90.0000 mg | Freq: Once | INTRAMUSCULAR | Status: AC
  Administered 2021-08-08: 90 mg via INTRA_ARTICULAR

## 2021-08-08 NOTE — Discharge Instructions (Signed)

## 2021-08-13 ENCOUNTER — Ambulatory Visit: Payer: BC Managed Care – PPO | Admitting: Physical Medicine and Rehabilitation

## 2021-08-15 ENCOUNTER — Ambulatory Visit: Payer: BC Managed Care – PPO | Admitting: Physical Medicine and Rehabilitation

## 2021-08-22 ENCOUNTER — Encounter: Payer: Self-pay | Admitting: Physical Medicine and Rehabilitation

## 2021-08-22 ENCOUNTER — Other Ambulatory Visit: Payer: Self-pay

## 2021-08-22 ENCOUNTER — Encounter
Payer: BC Managed Care – PPO | Attending: Physical Medicine & Rehabilitation | Admitting: Physical Medicine and Rehabilitation

## 2021-08-22 VITALS — BP 129/85 | HR 70 | Temp 97.9°F | Ht 77.0 in | Wt 359.0 lb

## 2021-08-22 DIAGNOSIS — G609 Hereditary and idiopathic neuropathy, unspecified: Secondary | ICD-10-CM | POA: Diagnosis not present

## 2021-08-22 DIAGNOSIS — M47816 Spondylosis without myelopathy or radiculopathy, lumbar region: Secondary | ICD-10-CM | POA: Diagnosis not present

## 2021-08-22 DIAGNOSIS — M8468XA Pathological fracture in other disease, other site, initial encounter for fracture: Secondary | ICD-10-CM | POA: Diagnosis not present

## 2021-08-22 MED ORDER — OXYCODONE-ACETAMINOPHEN 7.5-325 MG PO TABS: 1.0000 | ORAL_TABLET | ORAL | 0 refills | Status: DC | PRN

## 2021-08-22 NOTE — Progress Notes (Signed)
Subjective:    Patient ID: Eduardo [name], male    DOB: [birth year], 50 y.o.   MRN: [id number]  HPI  Eduardo [name] is a 50 year old man who presents for follow-up of his bilateral foot and knee pain, and low back pain.   1) Chronic pain syndrome:  -His pain has been worsened since last visit given his uncle's death.    2) Shoulder pain, left: -he is unable to raise the shoulder above this head.  -He does not want   3) Obesity:  -Has gained 6 lbs since last visit -He has been cooking and with his brother.  -He has been doing well with sugar.   4) Insomnia:  -He has been sleeping well.   5) Bilateral ankle pain: -he has seen a podiatrist and had MRIs done. -he has flat foot.  -would like to be pulled out of work.  -tried [employer] last visit and did not get good benefit.   6) Emphysema: -struggles to breath when he is working in the heat.   7) Poor quality of life: -he feels he takes the medicine so he can work, he works all day, and then feels exhausted when he gets home.  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  -he sometimes wonders what is the point of his life.  -he wishes he could stop working and spend more time with his grandkids, and help his daughters  54) Peripheral neuropathy -did not have much benefit from [place] -has decreased sensation on soles of both feet -pain is present both feet and ankes, less so on dorsum of feet'  9) Vertebral fracture: -would like to increase his pain medicine.   10) Facet arthropathy: -he had benefit, immediate relief.   Prior history: Since last visit he moved and that was quite a strain. He hurt his left shoulder on the 20th of last month. It is not getting better. He took a legal variety of CBD and was worried if this would come in his sample.   He has using ice pads on his ankle and knees. The Percocet has been helping.   BP 128/75 this visit!  He lost 12 lbs over the past 4 months. He has been drinking less sodas.  He still drinks about three per day.   He looks more energetic and vibrant!     Pain Inventory Average Pain 9 Pain Right Now 8 My pain is constant, sharp, stabbing and tingling  In the last 24 hours, has pain interfered with the following? General activity 9 Relation with others 10 Enjoyment of life 10 What TIME of day is your pain at its worst? morning , daytime, evening and night Sleep (in general) Poor  Pain is worse with: walking, sitting and standing Pain improves with: rest and medication Relief from Meds: 8  Family History  Problem Relation Age of Onset   Cancer Maternal Grandmother        breast, ovarian & colon   Hypertension Mother    Diabetes Mother    Hypertension Father    Heart disease Father    Stroke Paternal Grandfather    Neuropathy Neg Hx    Social History   Socioeconomic History   Marital status: Divorced    Spouse name: Not on file   Number of children: 2   Years of education: 12th    Highest education level: Not on file  Occupational History   Occupation: [name]  Tobacco Use   Smoking status: Every  Day    Packs/day: 0.25    Years: 33.00    Pack years: 8.25    Types: Cigarettes   Smokeless tobacco: Never   Tobacco comments:    1/2 pack per day 03/06/2021  Vaping Use   Vaping Use: Former  Substance and Sexual Activity   Alcohol use: No    Alcohol/week: 0.0 standard drinks    Comment: Rarely   Drug use: No   Sexual activity: Yes    Birth control/protection: None  Other Topics Concern   Not on file  Social History Narrative   Lives at home w/ his step-mother   Right-handed   Drinks about 2-3 [place] a day   Social Determinants of Health   Financial Resource Strain: Not on file  Food Insecurity: Not on file  Transportation Needs: Not on file  Physical Activity: Not on file  Stress: Not on file  Social Connections: Not on file   Past Surgical History:  Procedure Laterality Date   Irrigation and debridement of back abscess      MASS EXCISION Right 06/14/2019   Procedure: EXCISION BENIGN LESION RIGHT FOOT;  Surgeon: [name], DPM;  Location: [facility];  Service: Podiatry;  Laterality: Right;   NASAL SEPTOPLASTY W/ TURBINOPLASTY [name] 02/03/2018   Procedure: NASAL SEPTOPLASTY WITH TURBINATE REDUCTION;  Surgeon: [name], MD;  Location: [facility];  Service: ENT;  Laterality: [name];   PILONIDAL CYST DRAINAGE [name] 04/10/2017   Procedure: IRRIGATION AND DEBRIDEMENT BACK ABSCESS;  Surgeon: [name], MD;  Location: [facility];  Service: General;  Laterality: [name];   SINUS ENDO WITH FUSION [name] 02/03/2018   Procedure: ENDOSCOPIC SINUS SURGERY WITH FUSION;  Surgeon: [name], MD;  Location: [facility];  Service: ENT;  Laterality: [name];   THORACIC OUTLET SURGERY  2000   TOE ARTHROPLASTY Right 06/14/2019   Procedure: HALLUX ARTHROPLASTY RIGHT FOOT WITH TISSUE TRANSER EXCISION OF SESAMOID BONE;  Surgeon: [name], DPM;  [facility];  Service: Podiatry;  Laterality: Right;   WRIST FUSION  2002   Past Surgical History:  Procedure Laterality Date   Irrigation and debridement of back abscess     MASS EXCISION Right 06/14/2019   Procedure: EXCISION BENIGN LESION RIGHT FOOT;  Surgeon: [name], DPM;  [facility];  Service: Podiatry;  Laterality: Right;   NASAL SEPTOPLASTY W/ TURBINOPLASTY [name] 02/03/2018   Procedure: NASAL SEPTOPLASTY WITH TURBINATE REDUCTION;  Surgeon: [name], MD;  Location: [facility];  Service: ENT;  Laterality: [name];   PILONIDAL CYST DRAINAGE [name] 04/10/2017   Procedure: IRRIGATION AND DEBRIDEMENT BACK ABSCESS;  Surgeon: [name], MD;  [facility];  Service: General;  Laterality: [name];   SINUS ENDO WITH FUSION [name] 02/03/2018   Procedure: ENDOSCOPIC SINUS SURGERY WITH FUSION;  Surgeon: [name], MD;  Location: [facility];  Service: ENT;  Laterality: [name];   THORACIC OUTLET SURGERY  2000   TOE ARTHROPLASTY Right 06/14/2019   Procedure: HALLUX ARTHROPLASTY RIGHT FOOT WITH TISSUE TRANSER EXCISION OF  SESAMOID BONE;  Surgeon: [name], DPM;  [facility];  Service: Podiatry;  Laterality: Right;   WRIST FUSION  2002   Past Medical History:  Diagnosis Date   Anxiety    Chronic back pain    Degenerative disk disease    Depression    DVT of upper extremity (deep vein thrombosis) (HCC)    GERD (gastroesophageal reflux disease)    Hypertension    MRSA (methicillin resistant staph aureus)  culture positive    PAD (peripheral artery disease) (HCC)    Peripheral neuropathy    Pilonidal cyst    Pulmonary embolus ([place])    no blood thinner 2002   Sleep apnea    can't use cpap due to deviated nasal septumg   There were no vitals taken for this visit.  Opioid Risk Score:   Fall Risk Score:  `1  Depression screen PHQ 2/9  Depression screen [facility] 2/9 06/27/2021 04/25/2021 02/07/2021 01/07/2021 10/02/2020 07/11/2020 06/05/2020  Decreased Interest 1 1 1 2 1  0 1  Down, Depressed, Hopeless 1 1 1 1 1  0 1  PHQ - 2 Score 2 2 2 3 2  0 2  Altered sleeping - - - 3 - 3 -  Tired, decreased energy - - - 3 - 3 -  Change in appetite - - - 2 - 1 -  Feeling bad or failure about yourself  - - - 0 - 0 -  Trouble concentrating - - - 3 - 3 -  Moving slowly or fidgety/restless - - - 1 - 3 -  Suicidal thoughts - - - 0 - 0 -  PHQ-9 Score - - - 15 - 13 -  Difficult doing work/chores - - - - - - -  Some recent data might be hidden   Review of Systems     Objective:   Physical Exam Gen: no distress, normal appearing HEENT: oral mucosa pink and moist, NCAT Cardio: Reg rate Chest: normal effort, normal rate of breathing Abd: soft, non-distended Ext: no edema Psych: pleasant, normal affect Skin: intact Neuro: Alert and oriented x3 Musculoskeletal: Bilateral lower extremities edematous and tender to palpation at bilateral knees and ankles Range of motion decreased to 60 degrees in abduction and elevation of left arm. Pain worst when bending backwards.  Psych: pleasant, normal affect    Assessment &  Plan:  1. Chronic idiopathic axonal polyneuropathy: Continue [place].   2. Posterior tibial tendinitis of left lower extremity: Continue HEP as Tolerated. Continue to Monitor.    3. Lumbar DDD/ Lumbar Radiculitis:Continue current medication regime and HEP as Tolerated.    4. Chronic Pain Syndrome:  Increase Percocet 7.5/325mg  one tablet 6 times daily as needed for pain. This has resulted in improved pain control, especially when he has to work 10 hour long days.  -Discussed following foods that may reduce pain: 1) Ginger (especially studied for arthritis)- reduce leukotriene production to decrease inflammation 2) Blueberries- high in phytonutrients that decrease inflammation 3) Salmon- marine omega-3s reduce joint swelling and pain 4) Pumpkin seeds- reduce inflammation 5) dark chocolate- reduces inflammation 6) turmeric- reduces inflammation 7) tart cherries - reduce pain and stiffness 8) extra virgin olive oil - its compound olecanthal helps to block prostaglandins  9) chili peppers- can be eaten or applied topically via capsaicin 10) mint- helpful for headache, muscle aches, joint pain, and itching 11) garlic- reduces inflammation  Link to further information on diet for chronic pain: http://[email or web address].randall.[email or web address]   5. Obesity: Discussed good exercise and nutrition habits. Current weight is 361 lbs, down 6 lbs in the past 6 months! Made goal to decrease soda to down to twice per day. Monitor each visit.    6. Insomnia: Benefited from Amitriptyline 10mg  in the past. Does not require anymore- continues to be sleeping well!   7. Pilonidal cyst: Reviewed ID note. Recommended ID follow-up. Co-pay is expensive for patient and is the reason he could not follow-up. He cannot pursue surgery at this time  due to cost. Patient has been taking medication for 9 months and tolerating it well. Discussed side effects of long term use of  antibiotics. Prescribed DS Bactrim as previously ordered by ID. Plans to follow-up with ID by the end of the year. Referred to surgery for removal.   8) BP reviewed and better controlled this visit! Systolic is still slightly elevated.   9) Left sided adhesive capsulitis: Repeat steroid injection next visit.   10) Peripheral neuropathy:  -No benefit with [place].    All questions were encouraged and answered.

## 2021-08-29 ENCOUNTER — Other Ambulatory Visit: Payer: Self-pay | Admitting: Physician Assistant

## 2021-08-29 DIAGNOSIS — J302 Other seasonal allergic rhinitis: Secondary | ICD-10-CM

## 2021-08-29 DIAGNOSIS — J3089 Other allergic rhinitis: Secondary | ICD-10-CM

## 2021-09-26 ENCOUNTER — Telehealth: Payer: Self-pay | Admitting: Physical Medicine and Rehabilitation

## 2021-09-26 ENCOUNTER — Other Ambulatory Visit: Payer: Self-pay | Admitting: Physical Medicine and Rehabilitation

## 2021-09-26 MED ORDER — OXYCODONE-ACETAMINOPHEN 7.5-325 MG PO TABS: 1.0000 | ORAL_TABLET | ORAL | 0 refills | Status: DC | PRN

## 2021-09-26 NOTE — Telephone Encounter (Signed)
Needs Refill of perkiest

## 2021-09-29 ENCOUNTER — Telehealth: Payer: Self-pay | Admitting: *Deleted

## 2021-09-29 ENCOUNTER — Other Ambulatory Visit: Payer: Self-pay | Admitting: Physical Medicine and Rehabilitation

## 2021-09-29 DIAGNOSIS — I890 Lymphedema, not elsewhere classified: Secondary | ICD-10-CM | POA: Insufficient documentation

## 2021-09-29 MED ORDER — SERTRALINE HCL 25 MG PO TABS: 25.0000 mg | ORAL_TABLET | Freq: Every day | ORAL | 2 refills | Status: DC

## 2021-09-29 NOTE — Telephone Encounter (Signed)
Mr Straka called very distraught because his brother just found out he has cancer and he only has "days" to live and Mr Hartnett is having a hard time coping. He is asking for some kind of help. Can you give him a call. [phone]

## 2021-10-21 ENCOUNTER — Telehealth: Payer: Self-pay

## 2021-10-21 NOTE — Telephone Encounter (Signed)
Eduardo [name] had to reschedule his for tomorrow. Because he did not have his co-pay, his Brother died.  He will see [name] NP on 11/19/2021. Patient wanted to know if you will send in his pain medication refill?   Call back phone [phone].

## 2021-10-22 ENCOUNTER — Encounter: Payer: BC Managed Care – PPO | Admitting: Registered Nurse

## 2021-10-22 ENCOUNTER — Other Ambulatory Visit: Payer: Self-pay | Admitting: Physical Medicine and Rehabilitation

## 2021-10-22 DIAGNOSIS — G609 Hereditary and idiopathic neuropathy, unspecified: Secondary | ICD-10-CM

## 2021-10-22 MED ORDER — OXYCODONE-ACETAMINOPHEN 7.5-325 MG PO TABS: 1.0000 | ORAL_TABLET | ORAL | 0 refills | Status: DC | PRN

## 2021-10-22 NOTE — Telephone Encounter (Signed)
Patient informed. 

## 2021-11-03 ENCOUNTER — Encounter: Payer: Self-pay | Admitting: Physician Assistant

## 2021-11-03 ENCOUNTER — Ambulatory Visit (INDEPENDENT_AMBULATORY_CARE_PROVIDER_SITE_OTHER): Payer: BC Managed Care – PPO | Admitting: Physician Assistant

## 2021-11-03 ENCOUNTER — Other Ambulatory Visit: Payer: Self-pay

## 2021-11-03 VITALS — BP 138/81 | HR 74 | Temp 97.6°F | Ht 77.0 in | Wt 356.0 lb

## 2021-11-03 DIAGNOSIS — B999 Unspecified infectious disease: Secondary | ICD-10-CM

## 2021-11-03 DIAGNOSIS — Z8614 Personal history of Methicillin resistant Staphylococcus aureus infection: Secondary | ICD-10-CM

## 2021-11-03 DIAGNOSIS — F4321 Adjustment disorder with depressed mood: Secondary | ICD-10-CM

## 2021-11-03 DIAGNOSIS — F430 Acute stress reaction: Secondary | ICD-10-CM | POA: Diagnosis not present

## 2021-11-03 MED ORDER — HYDROXYZINE PAMOATE 25 MG PO CAPS
25.0000 mg | ORAL_CAPSULE | Freq: Three times a day (TID) | ORAL | 0 refills | Status: DC | PRN
Start: 2021-11-03 — End: 2021-11-13

## 2021-11-03 MED ORDER — CEFTRIAXONE SODIUM 1 G IJ SOLR
1.0000 g | Freq: Once | INTRAMUSCULAR | Status: AC
  Administered 2021-11-03: 1 g via INTRAMUSCULAR

## 2021-11-03 MED ORDER — DOXYCYCLINE HYCLATE 100 MG PO TABS: 100.0000 mg | ORAL_TABLET | Freq: Two times a day (BID) | ORAL | 0 refills | Status: DC

## 2021-11-03 NOTE — Progress Notes (Signed)
Acute Office Visit  Subjective:    Patient ID: Eduardo [name], male    DOB: January 10, 1971, 51 y.o.   MRN: [id number]  Chief Complaint  Patient presents with   Acute Visit    HPI Patient is in today for c/o recurrent skin infection. Patient has hx of MRSA. States in the past has needed to have IV antibiotics to clear the infection. Denies fever, chest pain, or palpitations. Does report intermittent night sweats. Feels disoriented which has been going since his brother passed away 10-19-21. States under increased stress due to finances and being out of work due to his ankle injury last year. Was started on sertraline 25 mg by his pain doctor. Continues to have anxiety. States coping fine with the death of his brother, not doing grief counseling.   Past Medical History:  Diagnosis Date   Anxiety    Chronic back pain    Degenerative disk disease    Depression    DVT of upper extremity (deep vein thrombosis) (HCC)    GERD (gastroesophageal reflux disease)    Hypertension    MRSA (methicillin resistant staph aureus) culture positive    PAD (peripheral artery disease) (HCC)    Peripheral neuropathy    Pilonidal cyst    Pulmonary embolus ([place])    no blood thinner 2002   Sleep apnea    can't use cpap due to deviated nasal septumg    Past Surgical History:  Procedure Laterality Date   Irrigation and debridement of back abscess     MASS EXCISION Right 06/14/2019   Procedure: EXCISION BENIGN LESION RIGHT FOOT;  Surgeon: [name], DPM;  [facility];  Service: Podiatry;  Laterality: Right;   NASAL SEPTOPLASTY W/ TURBINOPLASTY [name] 02/03/2018   Procedure: NASAL SEPTOPLASTY WITH TURBINATE REDUCTION;  Surgeon: [name], MD;  Location: [facility];  Service: ENT;  Laterality: [name];   PILONIDAL CYST DRAINAGE [name] 04/10/2017   Procedure: IRRIGATION AND DEBRIDEMENT BACK ABSCESS;  Surgeon: [name], MD;  [facility];  Service: General;  Laterality: [name];   SINUS ENDO WITH FUSION  [name] 02/03/2018   Procedure: ENDOSCOPIC SINUS SURGERY WITH FUSION;  Surgeon: [name], MD;  Location: [facility];  Service: ENT;  Laterality: [name];   THORACIC OUTLET SURGERY  2000   TOE ARTHROPLASTY Right 06/14/2019   Procedure: HALLUX ARTHROPLASTY RIGHT FOOT WITH TISSUE TRANSER EXCISION OF SESAMOID BONE;  Surgeon: [name], DPM;  [facility];  Service: Podiatry;  Laterality: Right;   WRIST FUSION  2002    Family History  Problem Relation Age of Onset   Cancer Maternal Grandmother        breast, ovarian & colon   Hypertension Mother    Diabetes Mother    Hypertension Father    Heart disease Father    Stroke Paternal Grandfather    Neuropathy Neg Hx     Social History   Socioeconomic History   Marital status: Divorced    Spouse name: Not on file   Number of children: 2   Years of education: 12th    Highest education level: Not on file  Occupational History   Occupation: [name]  Tobacco Use   Smoking status: Every Day    Packs/day: 0.25    Years: 33.00    Pack years: 8.25    Types: Cigarettes   Smokeless tobacco: Never   Tobacco comments:    1/2 pack per day 03/06/2021  Vaping Use   Vaping Use: Former  Substance and Sexual Activity   Alcohol use: No    Alcohol/week: 0.0 standard drinks    Comment: Rarely   Drug use: No   Sexual activity: Yes    Birth control/protection: None  Other Topics Concern   Not on file  Social History Narrative   Lives at home w/ his step-mother   Right-handed   Drinks about 2-3 [place] a day   Social Determinants of Health   Financial Resource Strain: Not on file  Food Insecurity: Not on file  Transportation Needs: Not on file  Physical Activity: Not on file  Stress: Not on file  Social Connections: Not on file  Intimate Partner Violence: Not on file    Outpatient Medications Prior to Visit  Medication Sig Dispense Refill   Acetaminophen (TYLENOL ARTHRITIS PAIN PO) Take 600 mg by mouth 3 (three) times daily.     albuterol  (PROVENTIL) (2.5 MG/3ML) 0.083% nebulizer solution Take 3 mLs (2.5 mg total) by nebulization every 6 (six) hours as needed for wheezing or shortness of breath. 75 mL 3   albuterol (VENTOLIN HFA) 108 (90 Base) MCG/ACT inhaler Inhale 2 puffs into the lungs every 6 (six) hours as needed for wheezing or shortness of breath. 1 each 1   azelastine (ASTELIN) 0.1 % nasal spray Place 1 spray into both nostrils 2 (two) times daily. Use in each nostril as directed 30 mL 1   budesonide-formoterol (SYMBICORT) 80-4.5 MCG/ACT inhaler Symbicort 80 mcg-4.5 mcg/actuation HFA aerosol inhaler  INHALE 2 PUFFS INTO THE LUNGS TWICE A DAY     [employer] NICOTINE TRANSDERMAL SYS 14 MG/24HR patch 14 mg daily.     Elastic Bandages & Supports (MEDICAL COMPRESSION STOCKINGS) MISC Wear one pair compression stockings daily while awake. Take off at bedtime. 2 each 5   fluticasone (FLONASE) 50 MCG/ACT nasal spray 1 spray each nostril twice daily after sinus rinses 16 g 2   montelukast (SINGULAIR) 10 MG tablet TAKE 1 TABLET BY MOUTH AT BEDTIME 90 tablet 0   Nutritional Supplements (JUICE PLUS FIBRE PO) Take 4 each by mouth daily with lunch.     oxyCODONE-acetaminophen (PERCOCET) 7.5-325 MG tablet Take 1 tablet by mouth every 4 (four) hours as needed. Do Not Fill Before 07/09/2021 180 tablet 0   pregabalin (LYRICA) 300 MG capsule TAKE 1 CAPSULE BY MOUTH TWICE A DAY 60 capsule 3   sertraline (ZOLOFT) 25 MG tablet Take 1 tablet (25 mg total) by mouth daily. 30 tablet 2   sulfamethoxazole-trimethoprim (BACTRIM DS) 800-160 MG tablet TAKE 1 TABLET BY MOUTH TWICE A DAY 120 tablet 3   amitriptyline (ELAVIL) 10 MG tablet Take 1 tablet (10 mg total) by mouth at bedtime. 30 tablet 3   doxycycline (VIBRA-TABS) 100 MG tablet Take 1 tablet (100 mg total) by mouth 2 (two) times daily. 14 tablet 0   meloxicam (MOBIC) 7.5 MG tablet Take 7.5 mg by mouth 2 (two) times daily.     predniSONE (DELTASONE) 10 MG tablet Take by mouth.     furosemide (LASIX) 40 MG  tablet Take 1 tablet (40 mg total) by mouth daily as needed (if weight increases 3 lbs overnight or 5 lbs in 1 week). (Patient not taking: Reported on 06/25/2020) 30 tablet 3   atorvastatin (LIPITOR) 10 MG tablet Take 1 tablet (10 mg total) by mouth daily. (Patient not taking: Reported on 03/06/2021) 90 tablet 3   No facility-administered medications prior to visit.    Allergies  Allergen Reactions   Pollen Extract  Cough   Dust Mite Extract Other (See Comments)   Other Other (See Comments)    Ragweed     Review of Systems Review of Systems:  A fourteen system review of systems was performed and found to be positive as per HPI.    Objective:    Physical Exam General:  Well Developed, well nourished, appropriate for stated age.  Neuro:  Alert and oriented,  extra-ocular muscles intact  HEENT:  Normocephalic, atraumatic, neck supple Skin:  several scattered skin lesion of LE and UE with scab formation Cardiac:  RRR, S1 S2 Respiratory:  CTA B/L w/ slight dec breath sounds at lung bases, Not using accessory muscles, speaking in full sentences- unlabored. Vascular:  Ext warm, no cyanosis apprec.; cap RF less 2 sec. Psych:  No HI/SI, judgement and insight good, Euthymic mood. Full Affect.  BP 138/81    Pulse 74    Temp 97.6 F (36.4 C)    Ht 6\' 5"  (1.956 m)    Wt (!) 356 lb (161.5 kg)    SpO2 98%    BMI 42.22 kg/m  Wt Readings from Last 3 Encounters:  11/03/21 (!) 356 lb (161.5 kg)  08/22/21 (!) 359 lb (162.8 kg)  06/27/21 (!) 355 lb 6.4 oz (161.2 kg)    Health Maintenance Due  Topic Date Due   COVID-19 Vaccine (1) Never done   Pneumococcal Vaccine 40-55 Years old (1 - PCV) Never done   URINE MICROALBUMIN  Never done   Zoster Vaccines- Shingrix (1 of 2) Never done   INFLUENZA VACCINE  Never done    There are no preventive care reminders to display for this patient.   Lab Results  Component Value Date   TSH 1.680 02/23/2020   Lab Results  Component Value Date   WBC 8.9  02/23/2020   HGB 16.0 02/23/2020   HCT 46.1 02/23/2020   MCV 95 02/23/2020   PLT 181 02/23/2020   Lab Results  Component Value Date   NA 140 04/12/2020   K 4.3 04/12/2020   CO2 22 04/12/2020   GLUCOSE 122 (H) 04/12/2020   BUN 13 04/12/2020   CREATININE 1.13 04/12/2020   BILITOT 0.8 04/12/2020   ALKPHOS 121 04/12/2020   AST 17 04/12/2020   ALT 16 04/12/2020   PROT 7.3 04/12/2020   ALBUMIN 4.3 04/12/2020   CALCIUM 8.9 04/12/2020   ANIONGAP 9 06/12/2019   Lab Results  Component Value Date   CHOL 92 (L) 02/23/2020   Lab Results  Component Value Date   HDL 19 (L) 02/23/2020   Lab Results  Component Value Date   LDLCALC 28 02/23/2020   Lab Results  Component Value Date   TRIG 299 (H) 02/23/2020   Lab Results  Component Value Date   CHOLHDL 4.8 02/23/2020   Lab Results  Component Value Date   HGBA1C 5.0 02/23/2020       Assessment & Plan:   Problem List Items Addressed This Visit       Other   History of MRSA infection - Primary   Relevant Medications   doxycycline (VIBRA-TABS) 100 MG tablet   Other Relevant Orders   Wound culture   Recurrent infections   Other Visit Diagnoses     Acute reaction to situational stress       Relevant Medications   hydrOXYzine (VISTARIL) 25 MG capsule   Grief          Patient has hx of MRSA infection and recurrent skin infections.  Has been evaluated by ID in the past and also has pilonidal cyst which he has not been able to take care of with general surgery because of cost. Will administer 1 gram of ceftriaxone in office today and recommend to start oral antibiotic- doxycycline tomorrow. Wound culture obtained. Recommend to avoid scratching lesions. Discussed considering general surgery consult for pilonidal cyst once feasible. Pt verbalized understanding.  Acute reaction to situational stress: -Recommend to re-consider grief counseling. Should continue with sertraline 25 mg which can be titrated if needed.  -Discussed  with patient to take hydroxyzine as needed for severe anxiety/panic attack. Follow-up if symptoms fail to improve or worsen.    Meds ordered this encounter  Medications   hydrOXYzine (VISTARIL) 25 MG capsule    Sig: Take 1 capsule (25 mg total) by mouth every 8 (eight) hours as needed for anxiety.    Dispense:  30 capsule    Refill:  0    Order Specific Question:   Supervising Provider    Answer:   Beatrice Lecher D [[id number]]   doxycycline (VIBRA-TABS) 100 MG tablet    Sig: Take 1 tablet (100 mg total) by mouth 2 (two) times daily for 10 days.    Dispense:  20 tablet    Refill:  0    Order Specific Question:   Supervising Provider    Answer:   Beatrice Lecher D [[id number]]   cefTRIAXone (ROCEPHIN) injection 1 g    Order Specific Question:   Antibiotic Indication:    Answer:   Other Indication (list below)    Order Specific Question:   Other Indication:    Answer:   History of MRSA infection     Lorrene Reid, PA-C

## 2021-11-03 NOTE — Patient Instructions (Signed)
MRSA Infection, Self-Care, Adult Methicillin-resistant Staphylococcus aureus (MRSA) infection is caused by bacteria that no longer respond to antibiotic medicines. MRSA infection can be hard to treat. Self-care is important after being diagnosed with MRSA. Following instructions for self-care will: Help you heal well. Prevent the infection from spreading to others. Your health care provider may also give you more specific instructions. If you have problems or questions, contact your health care provider. What are the risks? MRSA can be on the skin or in the nose of many people without causing problems. This is called MRSA colonization. However, MRSA can cause problems for you and others if it enters the body through a cut, a sore, or an invasive medical procedure or device. If MRSA enters your body, it may cause serious problems, such as: Skin infections. Bone or joint infections. Pneumonia. Bloodstream infections (sepsis). If you have these infections, MRSA may spread to others, including: Visitors or other patients in the hospital. Friends, family, or other people at home or in your community. Supplies needed: Soap and water. Germ-free (sterile) dressing. Alcohol-based hand sanitizer (optional). Home cleaning solutions that contain bleach. Clean or disposable towels. How to prevent MRSA infections Take these steps to avoid getting another MRSA infection and to prevent the bacteria from spreading to others. Hand washing  Wash your hands frequently with soap and water. If soap and water are not available, use an alcohol-based hand sanitizer. Dry your hands with a clean or disposable towel. Make sure that everyone in the household washes their hands often. Wound care  If you have a wound, follow instructions from your health care provider about how to take care of your wound. Make sure you: Wash your hands with soap and water before and after you change your bandage (dressing). If soap  and water are not available, use an alcohol-based hand sanitizer. Change your dressing as told by your health care provider. Leave any stitches (sutures), skin glue, or adhesive strips in place. These skin closures may need to be in place for 2 weeks or longer. If adhesive strip edges start to loosen and curl up, you may trim the loose edges. Do not remove adhesive strips completely unless your health care provider tells you to do that. Clean wounds, cuts, and abrasions with soap and water and cover them with dry, sterile dressings until they heal. Check your wound every day for signs of infection. Check for: More redness, swelling, or pain. More fluid or blood. Warmth. Pus or a bad smell. If you have a wound that seems to be infected, ask your health care provider if a culture should be done for MRSA and other bacteria. Personal hygiene Maintain good hygiene by bathing often and keeping your body clean. Wash hands before preparing food. Always shower after exercising. Wash towels, bedding, and clothes in the washing machine with detergent and hot water. Dry them in a hot dryer. General tips Take your antibiotic medicine as told by your health care provider. Take them only when absolutely necessary. Do not stop taking the antibiotic even if you start to feel better. Avoid close contact with others as much as possible. Do not use towels, razors, toothbrushes, bedding, or other items that will be used by others. Clean surfaces regularly to remove germs (disinfection). Use products or solutions that contain bleach. Make sure you disinfect bathroom surfaces, food preparation areas, and doorknobs. Follow these instructions at home: Take over-the-counter and prescription medicines only as told by your health care provider. Tell all health  care providers who care for you that you have MRSA or that you have had MRSA. If you are breastfeeding, talk to your health care provider about MRSA. You may be  asked to temporarily stop breastfeeding. If you have an invasive medical device, make sure that you know how to take care of it to prevent infection. Keep all follow-up visits as told by your health care provider. This is important. Contact a health care provider if: Your infection seems to be getting worse. Signs may include: Warmth, redness, or tenderness around your wound site. A red line that spreads from your infection site. A dark color in the area around your infection. Wound drainage that is tan, yellow, or green. A bad smell coming from your wound. You have symptoms of a new MRSA infection: A pus-filled pimple. A boil on your skin. Pus draining from your skin. A sore (abscess) under your skin or somewhere in your body. Fever with or without chills. Get help right away if: You have trouble breathing. You feel nauseous, you vomit, or you cannot take medicine without vomiting. You have chest pain. These symptoms may represent a serious problem that is an emergency. Do not wait to see if the symptoms will go away. Get medical help right away. Call your local emergency services (911 in the [place] Do not drive yourself to the hospital. Summary Self-care is important after being diagnosed with MRSA. This will help you heal well and prevent the infection from spreading to others. Wash your hands often, avoid close contact with others, and avoid sharing towels, razors, toothbrushes, and bedding with other people. This will help prevent the infection from spreading to others. If you are being treated for a MRSA infection, make sure to take over-the-counter and prescription medicines as told. Finish all antibiotic medicine even if you start to feel better. If you have a wound, follow instructions from your health care provider about how to take care of it. Check your wound every day for signs of infection. This information is not intended to replace advice given to you by your health care  provider. Make sure you discuss any questions you have with your health care provider. Document Revised: 12/22/2018 Document Reviewed: 12/23/2018 Elsevier Patient Education  [place].

## 2021-11-05 LAB — WOUND CULTURE: Organism ID, Bacteria: NONE SEEN

## 2021-11-13 ENCOUNTER — Ambulatory Visit (INDEPENDENT_AMBULATORY_CARE_PROVIDER_SITE_OTHER): Payer: BC Managed Care – PPO | Admitting: Physician Assistant

## 2021-11-13 ENCOUNTER — Encounter: Payer: Self-pay | Admitting: Physician Assistant

## 2021-11-13 ENCOUNTER — Other Ambulatory Visit: Payer: Self-pay

## 2021-11-13 VITALS — BP 122/76 | HR 70 | Temp 98.0°F | Ht 77.0 in | Wt 355.0 lb

## 2021-11-13 DIAGNOSIS — Z1329 Encounter for screening for other suspected endocrine disorder: Secondary | ICD-10-CM

## 2021-11-13 DIAGNOSIS — Z125 Encounter for screening for malignant neoplasm of prostate: Secondary | ICD-10-CM | POA: Diagnosis not present

## 2021-11-13 DIAGNOSIS — Z13228 Encounter for screening for other metabolic disorders: Secondary | ICD-10-CM

## 2021-11-13 DIAGNOSIS — E781 Pure hyperglyceridemia: Secondary | ICD-10-CM | POA: Diagnosis not present

## 2021-11-13 DIAGNOSIS — Z0000 Encounter for general adult medical examination without abnormal findings: Secondary | ICD-10-CM

## 2021-11-13 DIAGNOSIS — Z130 Encounter for screening for diseases of the blood and blood-forming organs and certain disorders involving the immune mechanism: Secondary | ICD-10-CM

## 2021-11-13 DIAGNOSIS — Z1321 Encounter for screening for nutritional disorder: Secondary | ICD-10-CM

## 2021-11-13 NOTE — Patient Instructions (Signed)

## 2021-11-13 NOTE — Progress Notes (Signed)
Complete physical exam   Patient: Eduardo [name]   DOB: 1971/09/24   51 y.o. Male  MRN: [id number] Visit Date: 11/13/2021   Chief Complaint  Patient presents with   Annual Exam   Subjective    Eduardo [name] is a 51 y.o. male who presents today for a complete physical exam.  He reports consuming a general diet. Exercise is limited by orthopedic condition(s): lumbar DDD, peripheral neuropathy, OA. He generally feels fair, states feeling better. He does not have additional problems to discuss today.    Past Medical History:  Diagnosis Date   Anxiety    Chronic back pain    Degenerative disk disease    Depression    DVT of upper extremity (deep vein thrombosis) (HCC)    GERD (gastroesophageal reflux disease)    Hypertension    MRSA (methicillin resistant staph aureus) culture positive    PAD (peripheral artery disease) (HCC)    Peripheral neuropathy    Pilonidal cyst    Pulmonary embolus ([place])    no blood thinner 2002   Sleep apnea    can't use cpap due to deviated nasal septumg   Past Surgical History:  Procedure Laterality Date   Irrigation and debridement of back abscess     MASS EXCISION Right 06/14/2019   Procedure: EXCISION BENIGN LESION RIGHT FOOT;  Surgeon: [name], DPM;  Location: [facility];  Service: Podiatry;  Laterality: Right;   NASAL SEPTOPLASTY W/ TURBINOPLASTY [name] 02/03/2018   Procedure: NASAL SEPTOPLASTY WITH TURBINATE REDUCTION;  Surgeon: [name], MD;  Location: [facility];  Service: ENT;  Laterality: [name];   PILONIDAL CYST DRAINAGE [name] 04/10/2017   Procedure: IRRIGATION AND DEBRIDEMENT BACK ABSCESS;  Surgeon: [name], MD;  Location: [facility];  Service: General;  Laterality: [name];   SINUS ENDO WITH FUSION [name] 02/03/2018   Procedure: ENDOSCOPIC SINUS SURGERY WITH FUSION;  Surgeon: [name], MD;  Location: [facility];  Service: ENT;  Laterality: [name];   THORACIC OUTLET SURGERY  2000   TOE ARTHROPLASTY Right 06/14/2019   Procedure:  HALLUX ARTHROPLASTY RIGHT FOOT WITH TISSUE TRANSER EXCISION OF SESAMOID BONE;  Surgeon: [name], DPM;  Location: [facility];  Service: Podiatry;  Laterality: Right;   WRIST FUSION  2002   Social History   Socioeconomic History   Marital status: Divorced    Spouse name: Not on file   Number of children: 2   Years of education: 12th    Highest education level: Not on file  Occupational History   Occupation: [name]  Tobacco Use   Smoking status: Every Day    Packs/day: 0.25    Years: 33.00    Pack years: 8.25    Types: Cigarettes   Smokeless tobacco: Never   Tobacco comments:    1/2 pack per day 03/06/2021  Vaping Use   Vaping Use: Former  Substance and Sexual Activity   Alcohol use: No    Alcohol/week: 0.0 standard drinks    Comment: Rarely   Drug use: No   Sexual activity: Yes    Birth control/protection: None  Other Topics Concern   Not on file  Social History Narrative   Lives at home w/ his step-mother   Right-handed   Drinks about 2-3 [place] a day   Social Determinants of Health   Financial Resource Strain: Not on file  Food Insecurity: Not on file  Transportation Needs: Not on file  Physical Activity: Not on file  Stress: Not on file  Social Connections: Not on file  Intimate Partner Violence: Not on file     Medications: Outpatient Medications Prior to Visit  Medication Sig   Acetaminophen (TYLENOL ARTHRITIS PAIN PO) Take 600 mg by mouth 3 (three) times daily.   albuterol (PROVENTIL) (2.5 MG/3ML) 0.083% nebulizer solution Take 3 mLs (2.5 mg total) by nebulization every 6 (six) hours as needed for wheezing or shortness of breath.   albuterol (VENTOLIN HFA) 108 (90 Base) MCG/ACT inhaler Inhale 2 puffs into the lungs every 6 (six) hours as needed for wheezing or shortness of breath.   budesonide-formoterol (SYMBICORT) 80-4.5 MCG/ACT inhaler Symbicort 80 mcg-4.5 mcg/actuation HFA aerosol inhaler  INHALE 2 PUFFS INTO THE LUNGS TWICE A DAY   [employer] NICOTINE  TRANSDERMAL SYS 14 MG/24HR patch 14 mg daily.   Elastic Bandages & Supports (MEDICAL COMPRESSION STOCKINGS) MISC Wear one pair compression stockings daily while awake. Take off at bedtime.   fluticasone (FLONASE) 50 MCG/ACT nasal spray 1 spray each nostril twice daily after sinus rinses   furosemide (LASIX) 40 MG tablet Take 1 tablet (40 mg total) by mouth daily as needed (if weight increases 3 lbs overnight or 5 lbs in 1 week). (Patient not taking: Reported on 06/25/2020)   montelukast (SINGULAIR) 10 MG tablet TAKE 1 TABLET BY MOUTH AT BEDTIME   Nutritional Supplements (JUICE PLUS FIBRE PO) Take 4 each by mouth daily with lunch.   oxyCODONE-acetaminophen (PERCOCET) 7.5-325 MG tablet Take 1 tablet by mouth every 4 (four) hours as needed. Do Not Fill Before 07/09/2021   pregabalin (LYRICA) 300 MG capsule TAKE 1 CAPSULE BY MOUTH TWICE A DAY   sertraline (ZOLOFT) 25 MG tablet Take 1 tablet (25 mg total) by mouth daily.   sulfamethoxazole-trimethoprim (BACTRIM DS) 800-160 MG tablet TAKE 1 TABLET BY MOUTH TWICE A DAY   [DISCONTINUED] azelastine (ASTELIN) 0.1 % nasal spray Place 1 spray into both nostrils 2 (two) times daily. Use in each nostril as directed   [DISCONTINUED] doxycycline (VIBRA-TABS) 100 MG tablet Take 1 tablet (100 mg total) by mouth 2 (two) times daily for 10 days.   [DISCONTINUED] hydrOXYzine (VISTARIL) 25 MG capsule Take 1 capsule (25 mg total) by mouth every 8 (eight) hours as needed for anxiety.   No facility-administered medications prior to visit.    Review of Systems Review of Systems:  A fourteen system review of systems was performed and found to be positive as per HPI.    Objective    BP 122/76    Pulse 70    Temp 98 F (36.7 C)    Ht 6\' 5"  (1.956 m)    Wt (!) 355 lb (161 kg)    SpO2 95%    BMI 42.10 kg/m    Physical Exam   General Appearance:    Well developed, well nourished male. Alert, cooperative, in no acute distress, appears stated age  Head:     Normocephalic, without obvious abnormality, atraumatic  Eyes:    PERRL, conjunctiva/corneas clear, EOM's intact, fundi    benign, both eyes       Ears:    Normal TM's and external ear canals, both ears  Nose:   Nares normal, septum midline, mucosa normal, no drainage   or sinus tenderness  Throat:   Lips, mucosa, and tongue normal; teeth and gums normal  Neck:   Supple, symmetrical, trachea midline, no adenopathy;  thyroid:  No enlargement/tenderness/nodules; no JVD  Back:     Symmetric, no curvature, ROM normal, no CVA tenderness  Lungs:     Scattered rhonchi, no wheezing or crackles, respirations unlabored  Chest wall:    No tenderness or deformity  Heart:    Normal heart rate. Normal rhythm. No murmurs, rubs, or gallops.  S1 and S2 normal  Abdomen:     Soft, non-tender, bowel sounds active all four quadrants,    no masses, no organomegaly  Genitalia:    deferred  Rectal:    deferred  Extremities:   All extremities are intact. No cyanosis or edema  Pulses:   2+ and symmetric all extremities  Skin:   Skin color, texture, turgor normal, no rashes or lesions  Lymph nodes:   Cervical, supraclavicular, and axillary nodes normal  Neurologic:   CNII-XII grossly intact     Last depression screening scores PHQ 2/9 Scores 11/13/2021 11/03/2021 08/22/2021  PHQ - 2 Score 4 6 2   PHQ- 9 Score 17 20 -   Last fall risk screening Fall Risk  11/13/2021  Falls in the past year? 1  Comment -  Number falls in past yr: 1  Comment -  Injury with Fall? 1  Risk for fall due to : History of fall(s);Impaired balance/gait;Impaired vision  Risk for fall due to: Comment -  Follow up Falls evaluation completed     No results found for any visits on 11/13/21.  Assessment & Plan    Routine Health Maintenance and Physical Exam  Exercise Activities and Dietary recommendations -Discussed heart healthy diet low in fat and carbohydrates. Recommend to continue to stay as active as  possible.  Immunization History  Administered Date(s) Administered   Tdap 07/30/2017    Health Maintenance  Topic Date Due   COVID-19 Vaccine (1) Never done   URINE MICROALBUMIN  Never done   Zoster Vaccines- Shingrix (1 of 2) Never done   INFLUENZA VACCINE  Never done   COLONOSCOPY (Pts 45-51yrs Insurance coverage will need to be confirmed)  01/07/2022 (Originally 12/21/2013)   TETANUS/TDAP  07/31/2027   Hepatitis C Screening  Completed   HIV Screening  Completed   HPV VACCINES  Aged Out    Discussed health benefits of physical activity, and encouraged him to engage in regular exercise appropriate for his age and condition.  Problem List Items Addressed This Visit       Other   Healthcare maintenance - Primary   Relevant Orders   TSH   Lipid panel   Hemoglobin A1c   Comprehensive metabolic panel   CBC with Differential/Platelet   Hypertriglyceridemia   Relevant Orders   Lipid panel   Other Visit Diagnoses     Screening for endocrine, nutritional, metabolic and immunity disorder       Relevant Orders   TSH   Lipid panel   Hemoglobin A1c   Comprehensive metabolic panel   CBC with Differential/Platelet   Screening for prostate cancer       Relevant Orders   PSA      Patient deferred immunizations.  Will obtain routine fasting labs. Continue montelukast and recommend to take oral antihistamine for environmental/seasonal allergies. Continue to follow up with various specialists. The patient was counseled on the dangers of tobacco use, and was advised to quit.  Reviewed strategies to maximize success, including stress management, substitution of other forms of reinforcement, and support of family/friends. Continue tobacco cessation efforts.  Return in about 1 year (around  11/13/2022) for CPE and FBW.       Lorrene Reid, PA-C  [facility] [phone] (phone) [phone] (fax)  [place]

## 2021-11-14 LAB — CBC WITH DIFFERENTIAL/PLATELET
Basophils Absolute: 0.1 10*3/uL (ref 0.0–0.2)
Basos: 1 %
EOS (ABSOLUTE): 0.3 10*3/uL (ref 0.0–0.4)
Eos: 3 %
Hematocrit: 48 % (ref 37.5–51.0)
Hemoglobin: 16.9 g/dL (ref 13.0–17.7)
Immature Grans (Abs): 0.1 10*3/uL (ref 0.0–0.1)
Immature Granulocytes: 1 %
Lymphocytes Absolute: 3.7 10*3/uL — ABNORMAL HIGH (ref 0.7–3.1)
Lymphs: 32 %
MCH: 33.5 pg — ABNORMAL HIGH (ref 26.6–33.0)
MCHC: 35.2 g/dL (ref 31.5–35.7)
MCV: 95 fL (ref 79–97)
Monocytes Absolute: 0.8 10*3/uL (ref 0.1–0.9)
Monocytes: 7 %
Neutrophils Absolute: 6.5 10*3/uL (ref 1.4–7.0)
Neutrophils: 56 %
Platelets: 170 10*3/uL (ref 150–450)
RBC: 5.04 x10E6/uL (ref 4.14–5.80)
RDW: 15.1 % (ref 11.6–15.4)
WBC: 11.5 10*3/uL — ABNORMAL HIGH (ref 3.4–10.8)

## 2021-11-14 LAB — COMPREHENSIVE METABOLIC PANEL
ALT: 17 IU/L (ref 0–44)
AST: 19 IU/L (ref 0–40)
Albumin/Globulin Ratio: 1.5 (ref 1.2–2.2)
Albumin: 4.4 g/dL (ref 3.8–4.9)
Alkaline Phosphatase: 122 IU/L — ABNORMAL HIGH (ref 44–121)
BUN/Creatinine Ratio: 12 (ref 9–20)
BUN: 14 mg/dL (ref 6–24)
Bilirubin Total: 0.3 mg/dL (ref 0.0–1.2)
CO2: 23 mmol/L (ref 20–29)
Calcium: 9.1 mg/dL (ref 8.7–10.2)
Chloride: 101 mmol/L (ref 96–106)
Creatinine, Ser: 1.19 mg/dL (ref 0.76–1.27)
Globulin, Total: 2.9 g/dL (ref 1.5–4.5)
Glucose: 83 mg/dL (ref 70–99)
Potassium: 4.4 mmol/L (ref 3.5–5.2)
Sodium: 141 mmol/L (ref 134–144)
Total Protein: 7.3 g/dL (ref 6.0–8.5)
eGFR: 73 mL/min/{1.73_m2} (ref >59)

## 2021-11-14 LAB — HEMOGLOBIN A1C
Est. average glucose Bld gHb Est-mCnc: 91 mg/dL
Hgb A1c MFr Bld: 4.8 % (ref 4.8–5.6)

## 2021-11-14 LAB — LIPID PANEL
Chol/HDL Ratio: 5.1 ratio — ABNORMAL HIGH (ref 0.0–5.0)
Cholesterol, Total: 128 mg/dL (ref 100–199)
HDL: 25 mg/dL — ABNORMAL LOW (ref >39)
LDL Chol Calc (NIH): 58 mg/dL (ref 0–99)
Triglycerides: 283 mg/dL — ABNORMAL HIGH (ref 0–149)
VLDL Cholesterol Cal: 45 mg/dL — ABNORMAL HIGH (ref 5–40)

## 2021-11-14 LAB — PSA: Prostate Specific Ag, Serum: 4.2 ng/mL — ABNORMAL HIGH (ref 0.0–4.0)

## 2021-11-14 LAB — TSH: TSH: 1.27 u[IU]/mL (ref 0.450–4.500)

## 2021-11-17 ENCOUNTER — Telehealth: Payer: Self-pay

## 2021-11-17 NOTE — Telephone Encounter (Signed)
Pt has cancelled appointment for tomorrow  because he is unable to pay for his visit , however he is stating he is out of pain meds .  He is a Dr.Raulkars patient but also sees you . Please advise and I can call him back . He has an appointment March 1st . This will be the second time canceling an appointment because of financial reasons.

## 2021-11-17 NOTE — Telephone Encounter (Signed)
Patient called requesting Percocet. Last filled #[id number] on 10/22/21

## 2021-11-18 ENCOUNTER — Encounter: Payer: Self-pay | Admitting: Registered Nurse

## 2021-11-18 ENCOUNTER — Other Ambulatory Visit: Payer: Self-pay | Admitting: Physical Medicine and Rehabilitation

## 2021-11-18 ENCOUNTER — Other Ambulatory Visit: Payer: Self-pay

## 2021-11-18 ENCOUNTER — Encounter: Payer: BC Managed Care – PPO | Attending: Physical Medicine & Rehabilitation | Admitting: Registered Nurse

## 2021-11-18 ENCOUNTER — Other Ambulatory Visit: Payer: Self-pay | Admitting: Physician Assistant

## 2021-11-18 VITALS — BP 166/98 | HR 81 | Temp 98.3°F | Ht 77.0 in | Wt 366.0 lb

## 2021-11-18 DIAGNOSIS — M5136 Other intervertebral disc degeneration, lumbar region: Secondary | ICD-10-CM | POA: Diagnosis present

## 2021-11-18 DIAGNOSIS — G894 Chronic pain syndrome: Secondary | ICD-10-CM

## 2021-11-18 DIAGNOSIS — Z5181 Encounter for therapeutic drug level monitoring: Secondary | ICD-10-CM

## 2021-11-18 DIAGNOSIS — M25571 Pain in right ankle and joints of right foot: Secondary | ICD-10-CM | POA: Diagnosis present

## 2021-11-18 DIAGNOSIS — M47816 Spondylosis without myelopathy or radiculopathy, lumbar region: Secondary | ICD-10-CM

## 2021-11-18 DIAGNOSIS — G8929 Other chronic pain: Secondary | ICD-10-CM

## 2021-11-18 DIAGNOSIS — M25572 Pain in left ankle and joints of left foot: Secondary | ICD-10-CM

## 2021-11-18 DIAGNOSIS — Z79891 Long term (current) use of opiate analgesic: Secondary | ICD-10-CM

## 2021-11-18 DIAGNOSIS — J3089 Other allergic rhinitis: Secondary | ICD-10-CM

## 2021-11-18 DIAGNOSIS — M5416 Radiculopathy, lumbar region: Secondary | ICD-10-CM

## 2021-11-18 DIAGNOSIS — G609 Hereditary and idiopathic neuropathy, unspecified: Secondary | ICD-10-CM

## 2021-11-18 DIAGNOSIS — J302 Other seasonal allergic rhinitis: Secondary | ICD-10-CM

## 2021-11-18 DIAGNOSIS — D72829 Elevated white blood cell count, unspecified: Secondary | ICD-10-CM

## 2021-11-18 MED ORDER — OXYCODONE-ACETAMINOPHEN 7.5-325 MG PO TABS: 1.0000 | ORAL_TABLET | ORAL | 0 refills | Status: DC | PRN

## 2021-11-18 MED ORDER — SERTRALINE HCL 50 MG PO TABS: 50.0000 mg | ORAL_TABLET | Freq: Every day | ORAL | 2 refills | Status: DC

## 2021-11-18 NOTE — Progress Notes (Signed)
Subjective:    Patient ID: Eduardo [name], male    DOB: [birth year], 51 y.o.   MRN: [id number]  HPI: Eduardo [name] is a 51 y.o. male who returns for follow up appointment for chronic pain and medication refill. He states his  pain is located in his lower back radiating into her bilateral lower extremities and bilateral feet with tingling and burning and bilateral ankle pain L>R. He rates his pain 7. His current exercise regime is walking and performing stretching exercises.  Eduardo [name] states he's having financial hardship, at this time he has to pay his own insurance while he is off from work, he also states his brother passed away  on 10/12/21. Emotional support given.  Dr [name] allowing him to come to office every two months due to the above, we reviewed the narcotic policy. We also discussed medication compliance, he has to take his medication as prescribed, he verbalizes understanding. We discussed self medicating this will lead to him to being weaned off his medication and discharged from our office he verbalizes understanding.   Eduardo [name] states the Zoloft isn't working, this was discussed with Dr [name], she will send him a prescription for Zoloft 50 mg. We will continue to monitor.   Eduardo [name] Morphine equivalent is 67.50 MME.   Oral Swab was Performed today     Pain Inventory Average Pain 9 Pain Right Now 7 My pain is sharp, burning, and tingling  In the last 24 hours, has pain interfered with the following? General activity 9 Relation with others 8 Enjoyment of life 10 What TIME of day is your pain at its worst? morning , daytime, evening, and night Sleep (in general) Poor  Pain is worse with: walking, bending, sitting, and standing Pain improves with: rest, medication, and injections Relief from Meds: 9  Family History  Problem Relation Age of Onset   Cancer Maternal Grandmother        breast, ovarian & colon   Hypertension Mother    Diabetes  Mother    Hypertension Father    Heart disease Father    Stroke Paternal Grandfather    Neuropathy Neg Hx    Social History   Socioeconomic History   Marital status: Divorced    Spouse name: Not on file   Number of children: 2   Years of education: 12th    Highest education level: Not on file  Occupational History   Occupation: [name]  Tobacco Use   Smoking status: Every Day    Packs/day: 0.25    Years: 33.00    Pack years: 8.25    Types: Cigarettes   Smokeless tobacco: Never   Tobacco comments:    1/2 pack per day 03/06/2021  Vaping Use   Vaping Use: Former  Substance and Sexual Activity   Alcohol use: No    Alcohol/week: 0.0 standard drinks    Comment: Rarely   Drug use: No   Sexual activity: Yes    Birth control/protection: None  Other Topics Concern   Not on file  Social History Narrative   Lives at home w/ his step-mother   Right-handed   Drinks about 2-3 [employer] a day   Social Determinants of Health   Financial Resource Strain: Not on file  Food Insecurity: Not on file  Transportation Needs: Not on file  Physical Activity: Not on file  Stress: Not on file  Social Connections: Not on file   Past Surgical History:  Procedure Laterality  Date   Irrigation and debridement of back abscess     MASS EXCISION Right 06/14/2019   Procedure: EXCISION BENIGN LESION RIGHT FOOT;  Surgeon: [name], DPM;  Location: [facility];  Service: Podiatry;  Laterality: Right;   NASAL SEPTOPLASTY W/ TURBINOPLASTY [name] 02/03/2018   Procedure: NASAL SEPTOPLASTY WITH TURBINATE REDUCTION;  Surgeon: [name], MD;  Location: [place];  Service: ENT;  Laterality: [name];   PILONIDAL CYST DRAINAGE [name] 04/10/2017   Procedure: IRRIGATION AND DEBRIDEMENT BACK ABSCESS;  Surgeon: [name], MD;  Location: [facility];  Service: General;  Laterality: [name];   SINUS ENDO WITH FUSION [name] 02/03/2018   Procedure: ENDOSCOPIC SINUS SURGERY WITH FUSION;  Surgeon: [name], MD;  Location: [place];  Service:  ENT;  Laterality: [name];   THORACIC OUTLET SURGERY  2000   TOE ARTHROPLASTY Right 06/14/2019   Procedure: HALLUX ARTHROPLASTY RIGHT FOOT WITH TISSUE TRANSER EXCISION OF SESAMOID BONE;  Surgeon: [name], DPM;  Location: [facility];  Service: Podiatry;  Laterality: Right;   WRIST FUSION  2002   Past Surgical History:  Procedure Laterality Date   Irrigation and debridement of back abscess     MASS EXCISION Right 06/14/2019   Procedure: EXCISION BENIGN LESION RIGHT FOOT;  Surgeon: [name], DPM;  Location: [facility];  Service: Podiatry;  Laterality: Right;   NASAL SEPTOPLASTY W/ TURBINOPLASTY [name] 02/03/2018   Procedure: NASAL SEPTOPLASTY WITH TURBINATE REDUCTION;  Surgeon: [name], MD;  Location: [place];  Service: ENT;  Laterality: [name];   PILONIDAL CYST DRAINAGE [name] 04/10/2017   Procedure: IRRIGATION AND DEBRIDEMENT BACK ABSCESS;  Surgeon: [name], MD;  Location[facility];  Service: General;  Laterality: [name];   SINUS ENDO WITH FUSION [name] 02/03/2018   Procedure: ENDOSCOPIC SINUS SURGERY WITH FUSION;  Surgeon: [name], MD;  Location: [place];  Service: ENT;  Laterality: [name];   THORACIC OUTLET SURGERY  2000   TOE ARTHROPLASTY Right 06/14/2019   Procedure: HALLUX ARTHROPLASTY RIGHT FOOT WITH TISSUE TRANSER EXCISION OF SESAMOID BONE;  Surgeon: [name], DPM;  Location[facility];  Service: Podiatry;  Laterality: Right;   WRIST FUSION  2002   Past Medical History:  Diagnosis Date   Anxiety    Chronic back pain    Degenerative disk disease    Depression    DVT of upper extremity (deep vein thrombosis) (HCC)    GERD (gastroesophageal reflux disease)    Hypertension    MRSA (methicillin resistant staph aureus) culture positive    PAD (peripheral artery disease) (HCC)    Peripheral neuropathy    Pilonidal cyst    Pulmonary embolus (HCC)    no blood thinner 2002   Sleep apnea    can't use cpap due to deviated nasal septumg   BP (!) 166/98    Pulse 81    Temp 98.3 F (36.8 C)     Ht 6\' 5"  (1.956 m)    Wt (!) 366 lb (166 kg)    SpO2 95%    BMI 43.40 kg/m   Opioid Risk Score:   Fall Risk Score:  `1  Depression screen PHQ 2/9  Depression screen [facility] 2/9 11/18/2021 11/13/2021 11/03/2021 08/22/2021 06/27/2021 04/25/2021 02/07/2021  Decreased Interest 3 2 3 1 1 1 1   Down, Depressed, Hopeless 3 2 3 1 1 1 1   PHQ - 2 Score 6 4 6 2 2 2 2   Altered sleeping - 3 3 - - - -  Tired, decreased  energy - 3 3 - - - -  Change in appetite - 1 3 - - - -  Feeling bad or failure about yourself  - 1 1 - - - -  Trouble concentrating - 3 3 - - - -  Moving slowly or fidgety/restless - 2 1 - - - -  Suicidal thoughts - 0 0 - - - -  PHQ-9 Score - 17 20 - - - -  Difficult doing work/chores - Very difficult Extremely dIfficult - - - -  Some recent data might be hidden     Review of Systems  Constitutional: Negative.   HENT: Negative.    Eyes: Negative.   Respiratory: Negative.    Cardiovascular: Negative.   Gastrointestinal: Negative.   Endocrine: Negative.   Genitourinary: Negative.   Musculoskeletal:  Positive for back pain.  Skin: Negative.   Allergic/Immunologic: Negative.   Neurological:        Tingling  Hematological: Negative.   Psychiatric/Behavioral:  Positive for dysphoric mood.   All other systems reviewed and are negative.     Objective:   Physical Exam Vitals and nursing note reviewed.  Constitutional:      Appearance: Normal appearance. He is obese.  Cardiovascular:     Rate and Rhythm: Normal rate and regular rhythm.     Pulses: Normal pulses.     Heart sounds: Normal heart sounds.  Pulmonary:     Effort: Pulmonary effort is normal.     Breath sounds: Normal breath sounds.  Musculoskeletal:     Cervical back: Normal range of motion and neck supple.     Comments: Normal Muscle Bulk and Muscle Testing Reveals:  Upper Extremities: Full ROM and Muscle Strength 5/5 Lumbar Paraspinal Tenderness: L-4-L-5 Lower Extremities: Right: Full ROM and Muscle Strength  5/5 Left Lower Extremity: Decreased ROM and Muscle Strength 4/5 Left Lower Extremity Flexion Produces Pain into his left ankle Arises from Table Slowly Narrow Based  Gait     Skin:    General: Skin is warm and dry.  Neurological:     Mental Status: He is alert and oriented to person, place, and time.  Psychiatric:        Mood and Affect: Mood normal.        Behavior: Behavior normal.         Assessment & Plan:  1. Lumbar Radiculitis: Continue Lyrica. Continue HEP as Tolerated. Continue to Monitor. 11/18/2021 2. Chronic idiopathic axonal polyneuropathy:  Continue Lyrica. Continue to Monitor. 11/18/2021 3. Posterior tibial tendinitis of left lower extremity: Continue HEP as Tolerated. Continue to Monitor. 11/18/2021 4. Lumbar DDD/ Lumbar Radiculitis: Continue current medication regime and HEP as Tolerated. 11/18/2021. 5. Chronic Pain Syndrome: Refilled : Oxycodone 7.5/325mg  one tablet every 4 hours  as needed for pain. #180. We will continue the opioid monitoring program, this consists of regular clinic visits, examinations, urine drug screen, pill counts as well as use of [place] Controlled Substance Reporting system on 11/18/2021  6. Chronic Bilateral  Ankle Pain: Continue HEP as Tolerated. Continue current medication regimen. Continue to monitor. 11/18/2021   F/U in 2 months

## 2021-11-19 ENCOUNTER — Encounter: Payer: BC Managed Care – PPO | Admitting: Registered Nurse

## 2021-11-21 ENCOUNTER — Other Ambulatory Visit: Payer: BC Managed Care – PPO

## 2021-11-21 ENCOUNTER — Other Ambulatory Visit: Payer: Self-pay

## 2021-11-21 DIAGNOSIS — D72829 Elevated white blood cell count, unspecified: Secondary | ICD-10-CM

## 2021-11-22 LAB — CBC WITH DIFFERENTIAL/PLATELET
Basophils Absolute: 0.1 10*3/uL (ref 0.0–0.2)
Basos: 1 %
EOS (ABSOLUTE): 0.4 10*3/uL (ref 0.0–0.4)
Eos: 4 %
Hematocrit: 43.6 % (ref 37.5–51.0)
Hemoglobin: 15.2 g/dL (ref 13.0–17.7)
Immature Grans (Abs): 0.1 10*3/uL (ref 0.0–0.1)
Immature Granulocytes: 1 %
Lymphocytes Absolute: 2.9 10*3/uL (ref 0.7–3.1)
Lymphs: 30 %
MCH: 33.6 pg — ABNORMAL HIGH (ref 26.6–33.0)
MCHC: 34.9 g/dL (ref 31.5–35.7)
MCV: 96 fL (ref 79–97)
Monocytes Absolute: 0.5 10*3/uL (ref 0.1–0.9)
Monocytes: 5 %
Neutrophils Absolute: 5.6 10*3/uL (ref 1.4–7.0)
Neutrophils: 59 %
Platelets: 152 10*3/uL (ref 150–450)
RBC: 4.53 x10E6/uL (ref 4.14–5.80)
RDW: 15.1 % (ref 11.6–15.4)
WBC: 9.6 10*3/uL (ref 3.4–10.8)

## 2021-11-22 LAB — DRUG TOX MONITOR 1 W/CONF, ORAL FLD
Amphetamines: NEGATIVE ng/mL (ref <10)
Barbiturates: NEGATIVE ng/mL (ref <10)
Benzodiazepines: NEGATIVE ng/mL (ref <0.50)
Buprenorphine: NEGATIVE ng/mL (ref <0.10)
Cocaine: NEGATIVE ng/mL (ref <5.0)
Codeine: NEGATIVE ng/mL (ref <2.5)
Cotinine: >250 ng/mL — ABNORMAL HIGH (ref <5.0)
Dihydrocodeine: NEGATIVE ng/mL (ref <2.5)
Fentanyl: NEGATIVE ng/mL (ref <0.10)
Heroin Metabolite: NEGATIVE ng/mL (ref <1.0)
Hydrocodone: NEGATIVE ng/mL (ref <2.5)
Hydromorphone: NEGATIVE ng/mL (ref <2.5)
MARIJUANA: POSITIVE ng/mL — AB (ref <2.5)
MDMA: NEGATIVE ng/mL (ref <10)
Meprobamate: NEGATIVE ng/mL (ref <2.5)
Methadone: NEGATIVE ng/mL (ref <5.0)
Morphine: NEGATIVE ng/mL (ref <2.5)
Nicotine Metabolite: POSITIVE ng/mL — AB (ref <5.0)
Norhydrocodone: NEGATIVE ng/mL (ref <2.5)
Noroxycodone: 52.3 ng/mL — ABNORMAL HIGH (ref <2.5)
Opiates: POSITIVE ng/mL — AB (ref <2.5)
Oxycodone: >250 ng/mL — ABNORMAL HIGH (ref <2.5)
Oxymorphone: NEGATIVE ng/mL (ref <2.5)
Phencyclidine: NEGATIVE ng/mL (ref <10)
THC: 63.5 ng/mL — ABNORMAL HIGH (ref <2.5)
Tapentadol: NEGATIVE ng/mL (ref <5.0)
Tramadol: NEGATIVE ng/mL (ref <5.0)
Zolpidem: NEGATIVE ng/mL (ref <5.0)

## 2021-11-22 LAB — DRUG TOX ALC METAB W/CON, ORAL FLD: Alcohol Metabolite: NEGATIVE ng/mL (ref <25)

## 2021-11-25 ENCOUNTER — Telehealth: Payer: Self-pay | Admitting: *Deleted

## 2021-11-25 NOTE — Telephone Encounter (Signed)
Urine drug screen is positive for marijuana. He had also taken more of his medication than prescribed. He has  been warned previously with letters about both of these issues.

## 2021-11-26 NOTE — Telephone Encounter (Signed)
Per Dr [name] [name] is to be discharged. I have canceled the Rx on hold to be filled 12/16/21 and sent a discharge letter through [employer] and [employer] certified mail. Included with letter is a wean down schedule and a list of surrounding pain clinics.

## 2021-12-09 ENCOUNTER — Other Ambulatory Visit: Payer: Self-pay

## 2021-12-09 ENCOUNTER — Telehealth: Payer: Self-pay | Admitting: Physician Assistant

## 2021-12-09 DIAGNOSIS — B999 Unspecified infectious disease: Secondary | ICD-10-CM

## 2021-12-09 DIAGNOSIS — Z8614 Personal history of Methicillin resistant Staphylococcus aureus infection: Secondary | ICD-10-CM

## 2021-12-09 NOTE — Telephone Encounter (Signed)
Spoke with patient and per PCP recommendation to send him to infectious disease for further evaluation of skin infection. Placed referral.

## 2021-12-09 NOTE — Telephone Encounter (Signed)
Patient left vm stating the places on his elbow and tailbone aren't any better and he needs to know what to do. Please advise? [phone]

## 2021-12-17 ENCOUNTER — Ambulatory Visit: Payer: BC Managed Care – PPO | Admitting: Registered Nurse

## 2021-12-23 ENCOUNTER — Encounter: Payer: Self-pay | Admitting: Infectious Disease

## 2021-12-23 ENCOUNTER — Ambulatory Visit (INDEPENDENT_AMBULATORY_CARE_PROVIDER_SITE_OTHER): Payer: BC Managed Care – PPO | Admitting: Infectious Disease

## 2021-12-23 ENCOUNTER — Other Ambulatory Visit: Payer: Self-pay

## 2021-12-23 VITALS — BP 163/95 | HR 78 | Temp 98.1°F | Resp 16 | Ht 76.0 in | Wt 354.0 lb

## 2021-12-23 DIAGNOSIS — Z8614 Personal history of Methicillin resistant Staphylococcus aureus infection: Secondary | ICD-10-CM | POA: Diagnosis not present

## 2021-12-23 DIAGNOSIS — F1120 Opioid dependence, uncomplicated: Secondary | ICD-10-CM

## 2021-12-23 DIAGNOSIS — Z6841 Body Mass Index (BMI) 40.0 and over, adult: Secondary | ICD-10-CM

## 2021-12-23 DIAGNOSIS — L299 Pruritus, unspecified: Secondary | ICD-10-CM | POA: Diagnosis not present

## 2021-12-23 DIAGNOSIS — B999 Unspecified infectious disease: Secondary | ICD-10-CM

## 2021-12-23 HISTORY — DX: Pruritus, unspecified: L29.9

## 2021-12-23 MED ORDER — DOXYCYCLINE HYCLATE 100 MG PO TABS: 100.0000 mg | ORAL_TABLET | Freq: Two times a day (BID) | ORAL | 1 refills | Status: DC

## 2021-12-23 NOTE — Progress Notes (Signed)
Subjective:  Chief complaint: Going problems with recurrent pruritic skin lesions   Patient ID: Eduardo [name], male    DOB: July 15, 1971, 51 y.o.   MRN: [id number] HPI  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    He told me that he has been suffering from these since around the year 2000.  He attributes some of this to problems with a pilonidal cyst that he developed for which she has not yet had surgery.  He has had outbreaks throughout on legs buttocks particular in the area where he has the pilonidal cyst, also on arms.  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    He has tried decolonization with Hibiclens in the past but not bleach baths.  We discussed the idea of bleach baths in the past but he believes he cannot get in and out of the bathtub and it will not fit in his current bathtub.  When I first saw him we gave him several months of doxycycline which he felt helped.  We then tried Bactrim and I had last seen him in November 2020.    Appears that he has been receiving Bactrim prescription since then but not for me.  He also is continue to take Hibiclens baths neither of these interventions seem to me making much of a difference he has had recent outbreaks on his arms and his face and 2 areas on his beard and has multiple nonhealing areas on his buttocks and perirectal area.   Past Medical History:  Diagnosis Date   Anxiety    Chronic back pain    Degenerative disk disease    Depression    DVT of upper extremity (deep vein thrombosis) (HCC)    GERD (gastroesophageal reflux disease)    Hypertension    MRSA (methicillin resistant staph aureus) culture positive    PAD (peripheral artery disease) (HCC)    Peripheral neuropathy    Pilonidal cyst    Pulmonary embolus ([place])    no blood thinner 2002   Sleep apnea    can't use cpap due to deviated nasal  septumg    Past Surgical History:  Procedure Laterality Date   Irrigation and debridement of back abscess     MASS EXCISION Right 06/14/2019   Procedure: EXCISION BENIGN LESION RIGHT FOOT;  Surgeon: [name], DPM;  Location[facility];  Service: Podiatry;  Laterality: Right;   NASAL SEPTOPLASTY W/ TURBINOPLASTY [name] 02/03/2018   Procedure: NASAL SEPTOPLASTY WITH TURBINATE REDUCTION;  Surgeon: [name], MD;  Location: [place];  Service: ENT;  Laterality: [name];   PILONIDAL CYST DRAINAGE [name] 04/10/2017   Procedure: IRRIGATION AND DEBRIDEMENT BACK ABSCESS;  Surgeon: [name], MD;  Location: [facility];  Service: General;  Laterality: [name];   SINUS ENDO WITH FUSION [name] 02/03/2018   Procedure: ENDOSCOPIC SINUS SURGERY WITH FUSION;  Surgeon: [name], MD;  Location: [place];  Service: ENT;  Laterality: [name];   THORACIC OUTLET SURGERY  2000   TOE ARTHROPLASTY Right 06/14/2019   Procedure: HALLUX ARTHROPLASTY RIGHT FOOT WITH TISSUE TRANSER EXCISION OF SESAMOID BONE;  Surgeon: [name], DPM;  Location[facility];  Service: Podiatry;  Laterality: Right;   WRIST FUSION  2002    Family History  Problem Relation Age of Onset   Cancer Maternal Grandmother        breast, ovarian & colon  Hypertension Mother    Diabetes Mother    Hypertension Father    Heart disease Father    Stroke Paternal Grandfather    Neuropathy Neg Hx       Social History   Socioeconomic History   Marital status: Divorced    Spouse name: Not on file   Number of children: 2   Years of education: 12th    Highest education level: Not on file  Occupational History   Occupation: [name]  Tobacco Use   Smoking status: Every Day    Packs/day: 0.25    Years: 33.00    Pack years: 8.25    Types: Cigarettes   Smokeless tobacco: Never   Tobacco comments:    1/2 pack per day 03/06/2021  Vaping Use   Vaping Use: Former  Substance and Sexual Activity   Alcohol use: No    Alcohol/week: 0.0 standard drinks    Comment:  Rarely   Drug use: No   Sexual activity: Yes    Birth control/protection: None  Other Topics Concern   Not on file  Social History Narrative   Lives at home w/ his step-mother   Right-handed   Drinks about 2-3 [place] a day   Social Determinants of Radio broadcast assistant Strain: Not on file  Food Insecurity: Not on file  Transportation Needs: Not on file  Physical Activity: Not on file  Stress: Not on file  Social Connections: Not on file    Allergies  Allergen Reactions   Pollen Extract Cough   Dust Mite Extract Other (See Comments)   Other Other (See Comments)    Ragweed      Current Outpatient Medications:    Acetaminophen (TYLENOL ARTHRITIS PAIN PO), Take 600 mg by mouth 3 (three) times daily., Disp: , Rfl:    montelukast (SINGULAIR) 10 MG tablet, TAKE 1 TABLET BY MOUTH AT BEDTIME, Disp: 90 tablet, Rfl: 1   Nutritional Supplements (JUICE PLUS FIBRE PO), Take 4 each by mouth daily with lunch., Disp: , Rfl:    albuterol (PROVENTIL) (2.5 MG/3ML) 0.083% nebulizer solution, Take 3 mLs (2.5 mg total) by nebulization every 6 (six) hours as needed for wheezing or shortness of breath. (Patient not taking: Reported on 12/23/2021), Disp: 75 mL, Rfl: 3   albuterol (VENTOLIN HFA) 108 (90 Base) MCG/ACT inhaler, Inhale 2 puffs into the lungs every 6 (six) hours as needed for wheezing or shortness of breath. (Patient not taking: Reported on 12/23/2021), Disp: 1 each, Rfl: 1   budesonide-formoterol (SYMBICORT) 80-4.5 MCG/ACT inhaler, Symbicort 80 mcg-4.5 mcg/actuation HFA aerosol inhaler  INHALE 2 PUFFS INTO THE LUNGS TWICE A DAY (Patient not taking: Reported on 12/23/2021), Disp: , Rfl:    [employer] NICOTINE TRANSDERMAL SYS 14 MG/24HR patch, 14 mg daily. (Patient not taking: Reported on 12/23/2021), Disp: , Rfl:    Elastic Bandages & Supports ([place]) MISC, Wear one pair compression stockings daily while awake. Take off at bedtime. (Patient not taking: Reported on 12/23/2021),  Disp: 2 each, Rfl: 5   fluticasone (FLONASE) 50 MCG/ACT nasal spray, 1 spray each nostril twice daily after sinus rinses (Patient not taking: Reported on 12/23/2021), Disp: 16 g, Rfl: 2   furosemide (LASIX) 40 MG tablet, Take 1 tablet (40 mg total) by mouth daily as needed (if weight increases 3 lbs overnight or 5 lbs in 1 week). (Patient not taking: Reported on 06/25/2020), Disp: 30 tablet, Rfl: 3   oxyCODONE-acetaminophen (PERCOCET) 7.5-325 MG tablet, Take 1 tablet by  mouth every 4 (four) hours as needed. Do Not Fill Before 12/16/2021 (Patient not taking: Reported on 12/23/2021), Disp: 180 tablet, Rfl: 0   pregabalin (LYRICA) 300 MG capsule, TAKE 1 CAPSULE BY MOUTH TWICE A DAY (Patient not taking: Reported on 12/23/2021), Disp: 60 capsule, Rfl: 3   sertraline (ZOLOFT) 50 MG tablet, Take 1 tablet (50 mg total) by mouth daily. (Patient not taking: Reported on 12/23/2021), Disp: 30 tablet, Rfl: 2   sulfamethoxazole-trimethoprim (BACTRIM DS) 800-160 MG tablet, TAKE 1 TABLET BY MOUTH TWICE A DAY (Patient not taking: Reported on 12/23/2021), Disp: 120 tablet, Rfl: 3     Review of Systems  Constitutional:  Negative for chills and fever.  HENT:  Negative for congestion and sore throat.   Eyes:  Negative for photophobia.  Respiratory:  Negative for cough, shortness of breath and wheezing.   Cardiovascular:  Negative for chest pain, palpitations and leg swelling.  Gastrointestinal:  Negative for abdominal pain, blood in stool, constipation, diarrhea, nausea and vomiting.  Genitourinary:  Negative for dysuria, flank pain and hematuria.  Musculoskeletal:  Negative for back pain and myalgias.  Skin:  Positive for rash and wound.  Neurological:  Negative for dizziness, weakness and headaches.  Hematological:  Does not bruise/bleed easily.  Psychiatric/Behavioral:  Negative for agitation, confusion, dysphoric mood and suicidal ideas.       Objective:   Physical Exam Constitutional:      Appearance: He is  well-developed. He is obese.  HENT:     Head: Normocephalic and atraumatic.  Eyes:     Conjunctiva/sclera: Conjunctivae normal.  Cardiovascular:     Rate and Rhythm: Normal rate and regular rhythm.  Pulmonary:     Effort: Pulmonary effort is normal. No respiratory distress.     Breath sounds: No wheezing.  Abdominal:     General: There is no distension.     Palpations: Abdomen is soft.  Musculoskeletal:        General: No tenderness. Normal range of motion.     Cervical back: Normal range of motion and neck supple.  Skin:    General: Skin is warm and dry.     Coloration: Skin is not pale.     Findings: No erythema or rash.  Neurological:     General: No focal deficit present.     Mental Status: He is alert and oriented to person, place, and time.  Psychiatric:        Mood and Affect: Mood normal.        Behavior: Behavior normal.        Thought Content: Thought content normal.        Judgment: Judgment normal.     Arms 12/23/2021:        Face:      Buttocks             Assessment & Plan:   Recurrent pruritic skin lesions:  I really do not think MRSA is driving this pathology I suspect his intense itching and likely scratching of these areas is causing the pathology.  I wonder if opiates which I believe he is beling rx may be contributing to this  I am going to refer him to wound care since I do not think he will be able to see a dermatologist quickly it may be helpful for him to have something that he can apply topically that does not worsen the itching   Give him a month of doxycycline and see him back at that point  in time but I do not really think the antibiotics of the answer to his problem.

## 2021-12-25 ENCOUNTER — Ambulatory Visit: Payer: BC Managed Care – PPO | Admitting: Adult Health

## 2021-12-25 ENCOUNTER — Ambulatory Visit: Payer: BC Managed Care – PPO | Admitting: Primary Care

## 2021-12-25 ENCOUNTER — Other Ambulatory Visit: Payer: Self-pay

## 2021-12-25 ENCOUNTER — Ambulatory Visit (INDEPENDENT_AMBULATORY_CARE_PROVIDER_SITE_OTHER): Payer: BC Managed Care – PPO | Admitting: Pulmonary Disease

## 2021-12-25 ENCOUNTER — Telehealth: Payer: Self-pay | Admitting: Physician Assistant

## 2021-12-25 ENCOUNTER — Encounter: Payer: Self-pay | Admitting: Adult Health

## 2021-12-25 DIAGNOSIS — G4733 Obstructive sleep apnea (adult) (pediatric): Secondary | ICD-10-CM

## 2021-12-25 DIAGNOSIS — F17210 Nicotine dependence, cigarettes, uncomplicated: Secondary | ICD-10-CM

## 2021-12-25 DIAGNOSIS — J3089 Other allergic rhinitis: Secondary | ICD-10-CM

## 2021-12-25 DIAGNOSIS — J432 Centrilobular emphysema: Secondary | ICD-10-CM | POA: Diagnosis not present

## 2021-12-25 DIAGNOSIS — R062 Wheezing: Secondary | ICD-10-CM

## 2021-12-25 DIAGNOSIS — J302 Other seasonal allergic rhinitis: Secondary | ICD-10-CM

## 2021-12-25 DIAGNOSIS — Z9989 Dependence on other enabling machines and devices: Secondary | ICD-10-CM

## 2021-12-25 DIAGNOSIS — J310 Chronic rhinitis: Secondary | ICD-10-CM

## 2021-12-25 DIAGNOSIS — F17200 Nicotine dependence, unspecified, uncomplicated: Secondary | ICD-10-CM

## 2021-12-25 LAB — PULMONARY FUNCTION TEST
DL/VA % pred: 89 %
DL/VA: 3.83 ml/min/mmHg/L
DLCO cor % pred: 83 %
DLCO cor: 28.34 ml/min/mmHg
DLCO unc % pred: 84 %
DLCO unc: 28.81 ml/min/mmHg
FEF 25-75 Post: 3.44 L/sec
FEF 25-75 Pre: 2.68 L/sec
FEF2575-%Change-Post: 28 %
FEF2575-%Pred-Post: 89 %
FEF2575-%Pred-Pre: 69 %
FEV1-%Change-Post: 5 %
FEV1-%Pred-Post: 78 %
FEV1-%Pred-Pre: 73 %
FEV1-Post: 3.57 L
FEV1-Pre: 3.38 L
FEV1FVC-%Change-Post: 4 %
FEV1FVC-%Pred-Pre: 98 %
FEV6-%Change-Post: 0 %
FEV6-%Pred-Post: 77 %
FEV6-%Pred-Pre: 76 %
FEV6-Post: 4.42 L
FEV6-Pre: 4.4 L
FEV6FVC-%Change-Post: 1 %
FEV6FVC-%Pred-Post: 103 %
FEV6FVC-%Pred-Pre: 102 %
FVC-%Change-Post: 0 %
FVC-%Pred-Post: 75 %
FVC-%Pred-Pre: 75 %
FVC-Post: 4.49 L
FVC-Pre: 4.46 L
Post FEV1/FVC ratio: 79 %
Post FEV6/FVC ratio: 100 %
Pre FEV1/FVC ratio: 76 %
Pre FEV6/FVC Ratio: 99 %
RV % pred: 88 %
RV: 2.12 L
TLC % pred: 92 %
TLC: 7.46 L

## 2021-12-25 MED ORDER — ALBUTEROL SULFATE HFA 108 (90 BASE) MCG/ACT IN AERS: 2.0000 | INHALATION_SPRAY | Freq: Four times a day (QID) | RESPIRATORY_TRACT | 6 refills | Status: DC | PRN

## 2021-12-25 MED ORDER — MONTELUKAST SODIUM 10 MG PO TABS: 10.0000 mg | ORAL_TABLET | Freq: Every day | ORAL | 6 refills | Status: DC

## 2021-12-25 MED ORDER — BUDESONIDE-FORMOTEROL FUMARATE 80-4.5 MCG/ACT IN AERO: 2.0000 | INHALATION_SPRAY | Freq: Every day | RESPIRATORY_TRACT | 6 refills | Status: DC

## 2021-12-25 NOTE — Progress Notes (Signed)
$'@Patient'J$  ID: Eduardo [name], male    DOB: 1971/02/28, 51 y.o.   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  Last seen May 2022.  Patient has underlying severe obstructive sleep apnea with previous sleep study showing AHI at 99/hour.  Patient was tried on several different CPAP mask but unfortunately was unable to tolerate.  Patient was referred to ENT last year.  He was seen by Dr. Redmond Baseman May 15, 2021.  Unfortunately patient's severity of obstructive sleep apnea and BMI at 43 disqualify him from the inspire device.  ENT notes were reviewed.  There was discussion of a tracheostomy patient declined. Has severe anxiety and claustrophobia . Can not stand for anything on his face.  We discussed positional sleep , referral for weight loss.  Patient declines referral for healthy weight and wellness.  Long discussion regarding dangers of sleep apnea and potential complications of untreated sleep apnea.  Long discussion regarding driving and dangers of sleepiness while driving..  Patient has underlying COPD with emphysema.  He continues to smoke a half a pack a day.  We discussed smoking cessation in detail.  He remains on Symbicort twice daily.  Admits does not use everyday . Uses albuterol few times a week.  Patient was set up for PFTs which were completed this morning that show mild to moderate restriction with an FEV1 at  78%, ratio 79, FVC 75%, no significant bronchodilator response, DLCO 84% Patient participates in the lung cancer screening program, CT chest January 24, 2021 showed emphysema.  Smoking-related respiratory bronchiolitis and stable small pulmonary nodules max measuring at 4.1 mm.  This was a lung RADS 2.  Due for CT chest next month  He has chronic allergies and is on Singulair daily.  Uses Flonase as needed.  Says under good control.  Patient declines vaccines  Allergies  Allergen Reactions   Pollen Extract Cough   Dust Mite Extract Other (See Comments)   Other Other (See Comments)    Ragweed     Immunization History  Administered Date(s) Administered   Tdap 07/30/2017    Past Medical History:  Diagnosis Date   Anxiety    Chronic back pain    Degenerative disk disease    Depression    DVT of upper extremity (deep vein thrombosis) (HCC)    GERD (gastroesophageal reflux disease)    Hypertension    MRSA (methicillin resistant staph aureus) culture positive    PAD (peripheral artery disease) (HCC)    Peripheral neuropathy    Pilonidal cyst    Pruritus 12/23/2021   Pulmonary embolus (HCC)    no blood thinner 2002   Sleep apnea    can't use cpap due to deviated nasal septumg    Tobacco History: Social History   Tobacco Use  Smoking Status Every Day   Packs/day: 0.25   Years:  33.00   Pack years: 8.25   Types: Cigarettes  Smokeless Tobacco Never  Tobacco Comments   1/2 pack per day 03/06/2021   Ready to quit: Not Answered Counseling given: Not Answered Tobacco comments: 1/2 pack per day 03/06/2021   Outpatient Medications Prior to Visit  Medication Sig Dispense Refill   Acetaminophen (TYLENOL ARTHRITIS PAIN PO) Take 600 mg by mouth 3 (three) times daily.     albuterol (PROVENTIL) (2.5 MG/3ML) 0.083% nebulizer solution Take 3 mLs (2.5 mg total) by nebulization every 6 (six) hours as needed for wheezing or shortness of breath. 75 mL 3   albuterol (VENTOLIN HFA) 108 (90  Base) MCG/ACT inhaler Inhale 2 puffs into the lungs every 6 (six) hours as needed for wheezing or shortness of breath. 1 each 1   budesonide-formoterol (SYMBICORT) 80-4.5 MCG/ACT inhaler      [employer] NICOTINE TRANSDERMAL SYS 14 MG/24HR patch 14 mg daily.     Elastic Bandages & Supports (MEDICAL COMPRESSION STOCKINGS) MISC Wear one pair compression stockings daily while awake. Take off at bedtime. 2 each 5   fluticasone (FLONASE) 50 MCG/ACT nasal spray 1 spray each nostril twice daily after sinus rinses 16 g 2   montelukast (SINGULAIR) 10 MG tablet TAKE 1 TABLET BY MOUTH AT BEDTIME 90 tablet 1   Nutritional Supplements (JUICE PLUS FIBRE PO) Take 4 each by mouth daily with lunch.     oxyCODONE-acetaminophen (PERCOCET) 7.5-325 MG tablet Take 1 tablet by mouth every 4 (four) hours as needed. Do Not Fill Before 12/16/2021 180 tablet 0   pregabalin (LYRICA) 300 MG capsule TAKE 1 CAPSULE BY MOUTH TWICE A DAY 60 capsule 3   sertraline (ZOLOFT) 50 MG tablet Take 1 tablet (50 mg total) by mouth daily. 30 tablet 2   doxycycline (VIBRA-TABS) 100 MG tablet Take 1 tablet (100 mg total) by mouth 2 (two) times daily. (Patient not taking: Reported on 12/25/2021) 60 tablet 1   furosemide (LASIX) 40 MG tablet Take 1 tablet (40 mg total) by mouth daily as needed (if weight increases 3 lbs overnight or 5 lbs in 1 week). (Patient not taking: Reported on 06/25/2020) 30 tablet 3   No facility-administered medications prior to visit.     Review of Systems:   Constitutional:   No  weight loss, night sweats,  Fevers, chills, fatigue, or  lassitude.  HEENT:   No headaches,  Difficulty swallowing,  Tooth/dental problems, or  Sore throat,                No sneezing, itching, ear ache, nasal congestion, post nasal drip,   CV:  No chest pain,  Orthopnea, PND, swelling in lower extremities, anasarca, dizziness, palpitations, syncope.   GI  No heartburn, indigestion, abdominal pain, nausea, vomiting, diarrhea, change in bowel  habits, loss of appetite, bloody stools.   Resp:   No excess mucus, no productive cough,  No non-productive cough,  No coughing up of blood.  No change in color of mucus.  No wheezing.  No chest wall deformity  Skin: no rash or lesions.  GU: no dysuria, change in color of urine, no urgency or frequency.  No flank pain, no hematuria   MS:  No joint pain or swelling.  No decreased range of motion.  No back pain.    Physical Exam  BP 134/82 (BP Location: Left Arm, Cuff Size: Large)    Pulse 84    Ht '6\' 3"'$  (1.905 m)    Wt (!) 346 lb (156.9  kg)    SpO2 95%    BMI 43.25 kg/m   GEN: A/Ox3; pleasant , NAD, BMI 43   HEENT:  [place]/AT,   NOSE-clear, THROAT-clear, no lesions, no postnasal drip or exudate noted.  Class III MP airway  NECK:  Supple w/ fair ROM; no JVD; normal carotid impulses w/o bruits; no thyromegaly or nodules palpated; no lymphadenopathy.    RESP  Clear  P & A; w/o, wheezes/ rales/ or rhonchi. no accessory muscle use, no dullness to percussion  CARD:  RRR, no m/r/g, no peripheral edema, pulses intact, no cyanosis or clubbing.  GI:   Soft & nt; nml bowel sounds; no organomegaly or masses detected.   Musco: Warm bil, no deformities or joint swelling noted.   Neuro: alert, no focal deficits noted.    Skin: Warm, no lesions or rashes    Lab Results:  CBC   BMET     ProBNP No results found for: PROBNP  Imaging: No results found.  cefTRIAXone (ROCEPHIN) injection 1 g     Date Action Dose Route User   11/03/2021 1417 Given 1 g Intramuscular (Right Upper Outer Quadrant) Aron Baba, CMA       PFT Results Latest Ref Rng & Units 12/25/2021  FVC-Pre L 4.46  FVC-Predicted Pre % 75  FVC-Post L 4.49  FVC-Predicted Post % 75  Pre FEV1/FVC % % 76  Post FEV1/FCV % % 79  FEV1-Pre L 3.38  FEV1-Predicted Pre % 73  FEV1-Post L 3.57  DLCO uncorrected ml/min/mmHg 28.81  DLCO UNC% % 84  DLCO corrected ml/min/mmHg 28.34  DLCO COR %Predicted % 83  DLVA  Predicted % 89  TLC L 7.46  TLC % Predicted % 92  RV % Predicted % 88    No results found for: NITRICOXIDE      Assessment & Plan:   OSA on CPAP Very severe sleep apnea unfortunately patient has been unable to tolerate.  He was referred to ENT 2022 but was not a candidate for the inspire device.  He declined tracheostomy. - discussed how weight can impact sleep and risk for sleep disordered breathing - discussed options to assist with weight loss: combination of diet modification, cardiovascular and strength training exercises   - had an extensive discussion regarding the adverse health consequences related to untreated sleep disordered breathing - specifically discussed the risks for hypertension, coronary artery disease, cardiac dysrhythmias, cerebrovascular disease, and diabetes - lifestyle modification discussed   - discussed how sleep disruption can increase risk of accidents, particularly when driving - safe driving practices were discussed  Plan  Patient Instructions  Continue on Symbicort 2 puffs twice daily, rinse after use Albuterol inhaler as needed Most important goal was to quit smoking Continue on Singulair daily Flonase nasal spray 2 puffs daily as needed Continue on healthy weight loss Call back if you change your mind on restarting CPAP. Do not drive if sleepy Follow-up with Dr. Elsworth Soho in 6 months or as needed        Chronic obstructive pulmonary disease ([place]) COPD with emphysema.  PFT showed no significant airflow obstruction.  Mild to moderate restriction appears to be stable.  Long discussion with patient regarding smoking cessation.  Encouraged on Symbicort compliance. Continue with lung cancer screening program.  Patient declines vaccines.  Plan  Patient Instructions  Continue on Symbicort 2 puffs twice daily, rinse after use Albuterol inhaler as needed Most important goal was to quit smoking Continue on Singulair daily Flonase nasal spray 2  puffs daily as needed Continue on healthy weight loss Call back if you change your mind on restarting CPAP. Do not drive if sleepy Follow-up with Dr. Elsworth Soho in 6 months or as needed     Morbid obesity ([place]) Healthy weight loss discussed.  Declines referral to healthy weight and wellness  Nicotine dependence with current use Encouraged on smoking cessation  Chronic rhinitis Continue on Singulair daily.  Flonase as needed     Rexene Edison, NP 12/25/2021

## 2021-12-25 NOTE — Assessment & Plan Note (Signed)
Very severe sleep apnea unfortunately patient has been unable to tolerate.  He was referred to ENT 2022 but was not a candidate for the inspire device.  He declined tracheostomy. ?- discussed how weight can impact sleep and risk for sleep disordered breathing ?- discussed options to assist with weight loss: combination of diet modification, cardiovascular and strength training exercises ?  ?- had an extensive discussion regarding the adverse health consequences related to untreated sleep disordered breathing ?- specifically discussed the risks for hypertension, coronary artery disease, cardiac dysrhythmias, cerebrovascular disease, and diabetes ?- lifestyle modification discussed ?  ?- discussed how sleep disruption can increase risk of accidents, particularly when driving ?- safe driving practices were discussed ? ?Plan  ?Patient Instructions  ?Continue on Symbicort 2 puffs twice daily, rinse after use ?Albuterol inhaler as needed ?Most important goal was to quit smoking ?Continue on Singulair daily ?Flonase nasal spray 2 puffs daily as needed ?Continue on healthy weight loss ?Call back if you change your mind on restarting CPAP. ?Do not drive if sleepy ?Follow-up with Dr. Elsworth Soho in 6 months or as needed ?  ? ?  ? ?

## 2021-12-25 NOTE — Assessment & Plan Note (Signed)
Continue on Singulair daily.  Flonase as needed ?

## 2021-12-25 NOTE — Patient Instructions (Addendum)
Continue on Symbicort 2 puffs twice daily, rinse after use ?Albuterol inhaler as needed ?Most important goal was to quit smoking ?Continue on Singulair daily ?Flonase nasal spray 2 puffs daily as needed ?Continue on healthy weight loss ?Call back if you change your mind on restarting CPAP. ?Do not drive if sleepy ?Follow-up with Dr. Elsworth Soho in 6 months or as needed ?

## 2021-12-25 NOTE — Telephone Encounter (Signed)
Patient is requesting a referral to Pain Management. I asked had he been seen in the office for that and he stated he had. Please advise. [phone] ?

## 2021-12-25 NOTE — Progress Notes (Signed)
PFT done today. 

## 2021-12-25 NOTE — Assessment & Plan Note (Signed)
Encouraged on smoking cessation 

## 2021-12-25 NOTE — Assessment & Plan Note (Signed)
COPD with emphysema.  PFT showed no significant airflow obstruction.  Mild to moderate restriction appears to be stable.  Long discussion with patient regarding smoking cessation.  Encouraged on Symbicort compliance. ?Continue with lung cancer screening program.  Patient declines vaccines. ? ?Plan  ?Patient Instructions  ?Continue on Symbicort 2 puffs twice daily, rinse after use ?Albuterol inhaler as needed ?Most important goal was to quit smoking ?Continue on Singulair daily ?Flonase nasal spray 2 puffs daily as needed ?Continue on healthy weight loss ?Call back if you change your mind on restarting CPAP. ?Do not drive if sleepy ?Follow-up with Dr. Elsworth Soho in 6 months or as needed ?  ? ?

## 2021-12-25 NOTE — Assessment & Plan Note (Signed)
Healthy weight loss discussed.  Declines referral to healthy weight and wellness ?

## 2021-12-29 ENCOUNTER — Other Ambulatory Visit: Payer: Self-pay

## 2021-12-29 ENCOUNTER — Telehealth: Payer: Self-pay | Admitting: Physician Assistant

## 2021-12-29 DIAGNOSIS — F11282 Opioid dependence with opioid-induced sleep disorder: Secondary | ICD-10-CM

## 2021-12-29 NOTE — Telephone Encounter (Signed)
Referral to [facility] pain clinic has been placed. AS, CMA ?

## 2021-12-29 NOTE — Telephone Encounter (Signed)
Patient called back and was told we would not be picking up his pain management and due to his positive drug result it will be very hard for another pain management clinic to accept him as a patient. He gave verbal understanding of advice.  ?

## 2021-12-29 NOTE — Addendum Note (Signed)
Addended by: Mickel Crow on: 12/29/2021 01:03 PM ? ? Modules accepted: Orders ? ?

## 2021-12-29 NOTE — Telephone Encounter (Signed)
Patient requesting referral to another pain medicine clinic. Patient has been discharged from the once he was seeing due to non-compliance. AS, CMA ?

## 2022-01-01 ENCOUNTER — Telehealth: Payer: Self-pay | Admitting: Physician Assistant

## 2022-01-01 NOTE — Telephone Encounter (Signed)
Patient called stating that he had dark maroon colored blood to red crimson blood in his stool. States he is having abdominal pain.  ? ?Advised patient we had no openings until Monday afternoon, but suggested that patient go to the ED for evaluation. Patient declined going to the ED and said he can wait until Monday. Advised against this, patient refused to seek treatment elsewhere. Apt scheduled for Monday afternoon. AS, CMA ?

## 2022-01-05 ENCOUNTER — Ambulatory Visit: Payer: BC Managed Care – PPO | Admitting: Physician Assistant

## 2022-01-05 ENCOUNTER — Other Ambulatory Visit: Payer: Self-pay

## 2022-01-05 ENCOUNTER — Encounter: Payer: Self-pay | Admitting: Physician Assistant

## 2022-01-05 VITALS — BP 127/80 | HR 84 | Temp 98.2°F | Ht 72.0 in | Wt 339.0 lb

## 2022-01-05 DIAGNOSIS — R1032 Left lower quadrant pain: Secondary | ICD-10-CM | POA: Diagnosis not present

## 2022-01-05 DIAGNOSIS — K921 Melena: Secondary | ICD-10-CM | POA: Diagnosis not present

## 2022-01-05 LAB — HEMOCCULT GUIAC POC 1CARD (OFFICE): Fecal Occult Blood, POC: NEGATIVE

## 2022-01-05 NOTE — Addendum Note (Signed)
Addended by: Aron Baba on: 01/05/2022 03:46 PM ? ? Modules accepted: Orders ? ?

## 2022-01-05 NOTE — Progress Notes (Signed)
?Established patient acute visit ? ? ?Patient: Eduardo [name]   DOB: [birth year]   51 y.o. Male  MRN: [id number] ?Visit Date: 01/05/2022 ? ?No chief complaint on file. ? ?Subjective  ?  ?HPI  ?Patient comes in with c/o bloody stools. Symptoms started last week. Also reports nausea, vomiting, some abdominal pain, and dry heaving. Unsure about fever but had cold chills few days ago. Has had 1 bowel movement daily with loose stool. Started taking imodium which has helped with diarrhea. No prior hx of diverticulitis. No chest pain, palpitations, belching, dizziness or recent travel.  ? ? ? ? ?Medications: ?Outpatient Medications Prior to Visit  ?Medication Sig  ? Acetaminophen (TYLENOL ARTHRITIS PAIN PO) Take 600 mg by mouth 3 (three) times daily.  ? albuterol (PROVENTIL) (2.5 MG/3ML) 0.083% nebulizer solution Take 3 mLs (2.5 mg total) by nebulization every 6 (six) hours as needed for wheezing or shortness of breath.  ? albuterol (VENTOLIN HFA) 108 (90 Base) MCG/ACT inhaler Inhale 2 puffs into the lungs every 6 (six) hours as needed for wheezing or shortness of breath.  ? budesonide-formoterol (SYMBICORT) 80-4.5 MCG/ACT inhaler Inhale 2 puffs into the lungs daily.  ? [employer] NICOTINE TRANSDERMAL SYS 14 MG/24HR patch 14 mg daily.  ? doxycycline (VIBRA-TABS) 100 MG tablet Take 1 tablet (100 mg total) by mouth 2 (two) times daily. (Patient not taking: Reported on 12/25/2021)  ? Elastic Bandages & Supports (MEDICAL COMPRESSION STOCKINGS) MISC Wear one pair compression stockings daily while awake. Take off at bedtime.  ? fluticasone (FLONASE) 50 MCG/ACT nasal spray 1 spray each nostril twice daily after sinus rinses  ? furosemide (LASIX) 40 MG tablet Take 1 tablet (40 mg total) by mouth daily as needed (if weight increases 3 lbs overnight or 5 lbs in 1 week). (Patient not taking: Reported on 06/25/2020)  ? montelukast (SINGULAIR) 10 MG tablet Take 1 tablet (10 mg total) by mouth at bedtime.  ? Nutritional Supplements (JUICE PLUS  FIBRE PO) Take 4 each by mouth daily with lunch.  ? oxyCODONE-acetaminophen (PERCOCET) 7.5-325 MG tablet Take 1 tablet by mouth every 4 (four) hours as needed. Do Not Fill Before 12/16/2021  ? pregabalin (LYRICA) 300 MG capsule TAKE 1 CAPSULE BY MOUTH TWICE A DAY  ? sertraline (ZOLOFT) 50 MG tablet Take 1 tablet (50 mg total) by mouth daily.  ? ?No facility-administered medications prior to visit.  ? ? ?Review of Systems ?Review of Systems:  ?A fourteen system review of systems was performed and found to be positive as per HPI. ? ? ?  Objective  ?  ?There were no vitals taken for this visit. ? ? ?Physical Exam  ?General:  Well Developed, well nourished, appropriate for stated age.  ?Neuro:  Alert and oriented,  extra-ocular muscles intact  ?HEENT:  Normocephalic, atraumatic, neck supple  ?Skin:  no gross rash, warm, mild pallor. ?Abdomen: Protuberant, tenderness of LLQ, +BS, no rebound tenderness, neg Murphy's and Rovsing's sign,  ?Cardiac:  RRR, S1 S2 ?Respiratory: CTA B/L  ?GU: small external hemorrhoids noted ?Vascular:  Ext warm, no cyanosis apprec.; cap RF less 2 sec. ?Psych:  No HI/SI, judgement and insight good, Euthymic mood. Full Affect. ? ? ?No results found for any visits on 01/05/22. ? Assessment & Plan  ?  ? ?Discussed with patient has s/sx highly suspicious for diverticulitis with LLQ pain on exam and hx of bloody stool. Will place order for abdomen pelvis CT w/ contrast and also collect labs and stool culture for further  evaluation. Advised patient if symptoms worsen before obtaining imaging study then will consider starting empiric antibiotic therapy. Pt verbalized understanding. Recommend to stay hydrated. ? ? ?No follow-ups on file.  ?   ? ? ? ?Lorrene Reid, PA-C  ?[facility] ?[phone] (phone) ?[phone] (fax) ? ?[facility] ?

## 2022-01-05 NOTE — Patient Instructions (Signed)
Diverticulitis Diverticulitis is infection or inflammation of small pouches (diverticula) in the colon that form due to a condition called diverticulosis. Diverticula can trap stool (feces) and bacteria, causing infection and inflammation. Diverticulitis may cause severe stomach pain and diarrhea. It may lead to tissue damage in the colon that causes bleeding or blockage. The diverticula may also burst (rupture) and cause infected stool to enter other areas of the abdomen. What are the causes? This condition is caused by stool becoming trapped in the diverticula, which allows bacteria to grow in the diverticula. This leads to inflammation and infection. What increases the risk? You are more likely to develop this condition if you have diverticulosis. The risk increases if you: Are overweight or obese. Do not get enough exercise. Drink alcohol. Use tobacco products. Eat a diet that has a lot of red meat such as beef, pork, or lamb. Eat a diet that does not include enough fiber. High-fiber foods include fruits, vegetables, beans, nuts, and whole grains. Are over 40 years of age. What are the signs or symptoms? Symptoms of this condition may include: Pain and tenderness in the abdomen. The pain is normally located on the left side of the abdomen, but it may occur in other areas. Fever and chills. Nausea. Vomiting. Cramping. Bloating. Changes in bowel routines. Blood in your stool. How is this diagnosed? This condition is diagnosed based on: Your medical history. A physical exam. Tests to make sure there is nothing else causing your condition. These tests may include: Blood tests. Urine tests. CT scan of the abdomen. How is this treated? Most cases of this condition are mild and can be treated at home. Treatment may include: Taking over-the-counter pain medicines. Following a clear liquid diet. Taking antibiotic medicines by mouth. Resting. More severe cases may need to be treated  at a hospital. Treatment may include: Not eating or drinking. Taking prescription pain medicine. Receiving antibiotic medicines through an IV. Receiving fluids and nutrition through an IV. Surgery. When your condition is under control, your health care provider may recommend that you have a colonoscopy. This is an exam to look at the entire large intestine. During the exam, a lubricated, bendable tube is inserted into the anus and then passed into the rectum, colon, and other parts of the large intestine. A colonoscopy can show how severe your diverticula are and whether something else may be causing your symptoms. Follow these instructions at home: Medicines Take over-the-counter and prescription medicines only as told by your health care provider. These include fiber supplements, probiotics, and stool softeners. If you were prescribed an antibiotic medicine, take it as told by your health care provider. Do not stop taking the antibiotic even if you start to feel better. Ask your health care provider if the medicine prescribed to you requires you to avoid driving or using machinery. Eating and drinking  Follow a full liquid diet or another diet as directed by your health care provider. After your symptoms improve, your health care provider may tell you to change your diet. He or she may recommend that you eat a diet that contains at least 25 grams (25 g) of fiber daily. Fiber makes it easier to pass stool. Healthy sources of fiber include: Berries. One cup contains 4-8 grams of fiber. Beans or lentils. One-half cup contains 5-8 grams of fiber. Green vegetables. One cup contains 4 grams of fiber. Avoid eating red meat. General instructions Do not use any products that contain nicotine or tobacco, such as cigarettes,   e-cigarettes, and chewing tobacco. If you need help quitting, ask your health care provider. Exercise for at least 30 minutes, 3 times each week. You should exercise hard enough to  raise your heart rate and break a sweat. Keep all follow-up visits as told by your health care provider. This is important. You may need to have a colonoscopy. Contact a health care provider if: Your pain does not improve. Your bowel movements do not return to normal. Get help right away if: Your pain gets worse. Your symptoms do not get better with treatment. Your symptoms suddenly get worse. You have a fever. You vomit more than one time. You have stools that are bloody, black, or tarry. Summary Diverticulitis is infection or inflammation of small pouches (diverticula) in the colon that form due to a condition called diverticulosis. Diverticula can trap stool (feces) and bacteria, causing infection and inflammation. You are at higher risk for this condition if you have diverticulosis and you eat a diet that does not include enough fiber. Most cases of this condition are mild and can be treated at home. More severe cases may need to be treated at a hospital. When your condition is under control, your health care provider may recommend that you have an exam called a colonoscopy. This exam can show how severe your diverticula are and whether something else may be causing your symptoms. Keep all follow-up visits as told by your health care provider. This is important. This information is not intended to replace advice given to you by your health care provider. Make sure you discuss any questions you have with your health care provider. Document Revised: 07/17/2019 Document Reviewed: 07/17/2019 Elsevier Patient Education  2022 [employer]  

## 2022-01-06 LAB — CBC WITH DIFFERENTIAL/PLATELET
Basophils Absolute: 0.1 10*3/uL (ref 0.0–0.2)
Basos: 1 %
EOS (ABSOLUTE): 0.4 10*3/uL (ref 0.0–0.4)
Eos: 3 %
Hematocrit: 47.7 % (ref 37.5–51.0)
Hemoglobin: 17.6 g/dL (ref 13.0–17.7)
Immature Grans (Abs): 0.1 10*3/uL (ref 0.0–0.1)
Immature Granulocytes: 1 %
Lymphocytes Absolute: 3.4 10*3/uL — ABNORMAL HIGH (ref 0.7–3.1)
Lymphs: 28 %
MCH: 34.6 pg — ABNORMAL HIGH (ref 26.6–33.0)
MCHC: 36.9 g/dL — ABNORMAL HIGH (ref 31.5–35.7)
MCV: 94 fL (ref 79–97)
Monocytes Absolute: 0.8 10*3/uL (ref 0.1–0.9)
Monocytes: 7 %
Neutrophils Absolute: 7.2 10*3/uL — ABNORMAL HIGH (ref 1.4–7.0)
Neutrophils: 60 %
Platelets: 211 10*3/uL (ref 150–450)
RBC: 5.08 x10E6/uL (ref 4.14–5.80)
RDW: 14.3 % (ref 11.6–15.4)
WBC: 11.9 10*3/uL — ABNORMAL HIGH (ref 3.4–10.8)

## 2022-01-06 LAB — COMPREHENSIVE METABOLIC PANEL
ALT: 31 IU/L (ref 0–44)
AST: 25 IU/L (ref 0–40)
Albumin/Globulin Ratio: 1.6 (ref 1.2–2.2)
Albumin: 4.5 g/dL (ref 3.8–4.9)
Alkaline Phosphatase: 117 IU/L (ref 44–121)
BUN/Creatinine Ratio: 10 (ref 9–20)
BUN: 12 mg/dL (ref 6–24)
Bilirubin Total: 0.9 mg/dL (ref 0.0–1.2)
CO2: 21 mmol/L (ref 20–29)
Calcium: 9.4 mg/dL (ref 8.7–10.2)
Chloride: 103 mmol/L (ref 96–106)
Creatinine, Ser: 1.19 mg/dL (ref 0.76–1.27)
Globulin, Total: 2.9 g/dL (ref 1.5–4.5)
Glucose: 74 mg/dL (ref 70–99)
Potassium: 4 mmol/L (ref 3.5–5.2)
Sodium: 141 mmol/L (ref 134–144)
Total Protein: 7.4 g/dL (ref 6.0–8.5)
eGFR: 73 mL/min/{1.73_m2} (ref >59)

## 2022-01-06 LAB — LIPASE: Lipase: 28 U/L (ref 13–78)

## 2022-01-12 ENCOUNTER — Ambulatory Visit: Payer: BC Managed Care – PPO | Admitting: Registered Nurse

## 2022-01-12 ENCOUNTER — Other Ambulatory Visit: Payer: Self-pay

## 2022-01-12 DIAGNOSIS — K921 Melena: Secondary | ICD-10-CM

## 2022-01-12 DIAGNOSIS — R1032 Left lower quadrant pain: Secondary | ICD-10-CM

## 2022-01-13 ENCOUNTER — Other Ambulatory Visit: Payer: Self-pay | Admitting: Physician Assistant

## 2022-01-13 DIAGNOSIS — R1032 Left lower quadrant pain: Secondary | ICD-10-CM

## 2022-01-13 DIAGNOSIS — K921 Melena: Secondary | ICD-10-CM

## 2022-01-14 ENCOUNTER — Ambulatory Visit (HOSPITAL_BASED_OUTPATIENT_CLINIC_OR_DEPARTMENT_OTHER): Payer: BC Managed Care – PPO

## 2022-01-26 ENCOUNTER — Ambulatory Visit
Admission: RE | Admit: 2022-01-26 | Discharge: 2022-01-26 | Disposition: A | Discharge status: Home / Self Care | Payer: BC Managed Care – PPO | Source: Ambulatory Visit | Attending: Physician Assistant | Admitting: Physician Assistant

## 2022-01-26 DIAGNOSIS — K921 Melena: Secondary | ICD-10-CM

## 2022-01-26 DIAGNOSIS — R1032 Left lower quadrant pain: Secondary | ICD-10-CM

## 2022-01-27 ENCOUNTER — Ambulatory Visit: Payer: BC Managed Care – PPO | Admitting: Infectious Disease

## 2022-02-05 ENCOUNTER — Telehealth: Payer: Self-pay | Admitting: Physician Assistant

## 2022-02-05 NOTE — Telephone Encounter (Signed)
Patient called asking if you can add another medication to his Singulair because he is having a terrible time with his allergies and the Singulair is not helping. Please advise.  ?

## 2022-02-05 NOTE — Telephone Encounter (Signed)
Patient was called and provided PCP recommendations. He was agreeable to trying an antihistamine and nasal spray.  ?

## 2022-02-12 ENCOUNTER — Other Ambulatory Visit: Payer: BC Managed Care – PPO

## 2022-02-12 DIAGNOSIS — R972 Elevated prostate specific antigen [PSA]: Secondary | ICD-10-CM

## 2022-02-12 DIAGNOSIS — Z125 Encounter for screening for malignant neoplasm of prostate: Secondary | ICD-10-CM

## 2022-02-13 LAB — PSA: Prostate Specific Ag, Serum: 0.5 ng/mL (ref 0.0–4.0)

## 2022-02-17 ENCOUNTER — Ambulatory Visit: Payer: BC Managed Care – PPO | Admitting: Physical Medicine and Rehabilitation

## 2022-02-18 ENCOUNTER — Ambulatory Visit: Payer: BC Managed Care – PPO | Admitting: Physical Medicine and Rehabilitation

## 2022-05-01 ENCOUNTER — Ambulatory Visit (INDEPENDENT_AMBULATORY_CARE_PROVIDER_SITE_OTHER): Payer: BC Managed Care – PPO | Admitting: Physician Assistant

## 2022-05-01 ENCOUNTER — Encounter: Payer: Self-pay | Admitting: Physician Assistant

## 2022-05-01 VITALS — BP 128/86 | HR 82 | Ht 72.0 in | Wt 327.1 lb

## 2022-05-01 DIAGNOSIS — L0291 Cutaneous abscess, unspecified: Secondary | ICD-10-CM | POA: Diagnosis not present

## 2022-05-01 DIAGNOSIS — B9689 Other specified bacterial agents as the cause of diseases classified elsewhere: Secondary | ICD-10-CM

## 2022-05-01 DIAGNOSIS — L089 Local infection of the skin and subcutaneous tissue, unspecified: Secondary | ICD-10-CM

## 2022-05-01 MED ORDER — CEFTRIAXONE SODIUM 1 G IJ SOLR
1.0000 g | Freq: Once | INTRAMUSCULAR | Status: AC
  Administered 2022-05-01: 1 g via INTRAMUSCULAR

## 2022-05-01 MED ORDER — SULFAMETHOXAZOLE-TRIMETHOPRIM 800-160 MG PO TABS: 1.0000 | ORAL_TABLET | Freq: Two times a day (BID) | ORAL | 0 refills | Status: AC

## 2022-05-01 NOTE — Progress Notes (Signed)
Established patient acute visit   Patient: Eduardo [name]   DOB: 1971-05-10   51 y.o. Male  MRN: [id number] Visit Date: 05/01/2022  Chief Complaint  Patient presents with   Back Pain   Subjective    HPI  Patient presents for c/o skin infection of low back. Patient reports has three skin lesions. Reports large lesion appeared a few weeks ago after working in the yard. 2 smaller lesions appeared this week. States lesions are painful. The first lesion has drained. The two smaller lesions have started to drain pus. Does report chills and thinks had a fever. Has been cleaning lesions with Hibiclens. Patient is asking something for pain. States it is hard to lay down because of the pain.   2.5 x 1.5 1 cm x 1 cm      Medications: Outpatient Medications Prior to Visit  Medication Sig   Acetaminophen (TYLENOL ARTHRITIS PAIN PO) Take 600 mg by mouth 3 (three) times daily.   montelukast (SINGULAIR) 10 MG tablet Take 1 tablet (10 mg total) by mouth at bedtime.   Nutritional Supplements (JUICE PLUS FIBRE PO) Take 4 each by mouth daily with lunch.   albuterol (PROVENTIL) (2.5 MG/3ML) 0.083% nebulizer solution Take 3 mLs (2.5 mg total) by nebulization every 6 (six) hours as needed for wheezing or shortness of breath.   albuterol (VENTOLIN HFA) 108 (90 Base) MCG/ACT inhaler Inhale 2 puffs into the lungs every 6 (six) hours as needed for wheezing or shortness of breath.   budesonide-formoterol (SYMBICORT) 80-4.5 MCG/ACT inhaler Inhale 2 puffs into the lungs daily.   [facility] NICOTINE TRANSDERMAL SYS 14 MG/24HR patch 14 mg daily.   Elastic Bandages & Supports (MEDICAL COMPRESSION STOCKINGS) MISC Wear one pair compression stockings daily while awake. Take off at bedtime.   fluticasone (FLONASE) 50 MCG/ACT nasal spray 1 spray each nostril twice daily after sinus rinses   furosemide (LASIX) 40 MG tablet Take 1 tablet (40 mg total) by mouth daily as needed (if weight increases 3 lbs overnight or 5 lbs in  1 week). (Patient not taking: Reported on 06/25/2020)   oxyCODONE-acetaminophen (PERCOCET) 7.5-325 MG tablet Take 1 tablet by mouth every 4 (four) hours as needed. Do Not Fill Before 12/16/2021   pregabalin (LYRICA) 300 MG capsule TAKE 1 CAPSULE BY MOUTH TWICE A DAY   sertraline (ZOLOFT) 50 MG tablet Take 1 tablet (50 mg total) by mouth daily.   [DISCONTINUED] doxycycline (VIBRA-TABS) 100 MG tablet Take 1 tablet (100 mg total) by mouth 2 (two) times daily.   No facility-administered medications prior to visit.    Review of Systems Review of Systems:  A fourteen system review of systems was performed and found to be positive as per HPI.     Objective    BP 128/86   Pulse 82   Ht 6' (1.829 m)   Wt (!) 327 lb 1.9 oz (148.4 kg)   SpO2 98%   BMI 44.37 kg/m    Physical Exam  General:  In no acute distress, non-toxic appearing, appropriate for stated age.  Neuro:  Alert and oriented,  extra-ocular muscles intact  HEENT:  Normocephalic, atraumatic, neck supple  Skin:  2.5 cm x 1.5 cm scabbed lesion of mid back, 1 cm x 1 cm lesion with small amount of mucopurulent drainage with surrounding erythema of left lower back, 1 cm x 1 cm lesion with black discoloration, lesions are painful with slight fluctuance  Respiratory: No respiratory distress. Speaking in full sentences.  Vascular:  Ext warm, no cyanosis apprec.; cap RF less 2 sec. Psych:  No HI/SI, judgement and insight appropriate    No results found for any visits on 05/01/22.  Assessment & Plan     Patient has history of MRSA and pilonidal cyst with signs consistent with bacterial skin infection. Wound culture collected. Will administer ceftriaxone 1 gram in office and start oral antibiotic therapy with Bactrim DS 800-160 mg BID x 7 days. Recommend to monitor for worsening symptoms which should warrant ED evaluation. Patient discharged from pain clinic for positive UDS for marijuana and signs of taking more medication than prescribed  so reluctant to prescribe pain medication for acute symptoms. Discussed alternatives such as Gabapentin or Lyrica. Pt deferred. Follow-up as needed.   Return if symptoms worsen or fail to improve.        Lorrene Reid, PA-C  [facility] [phone] (phone) [phone] (fax)  [place]

## 2022-05-07 LAB — WOUND CULTURE

## 2022-07-14 ENCOUNTER — Ambulatory Visit: Payer: BC Managed Care – PPO | Admitting: Physical Medicine and Rehabilitation

## 2022-08-19 ENCOUNTER — Other Ambulatory Visit: Payer: Self-pay | Admitting: Physician Assistant

## 2022-08-19 ENCOUNTER — Telehealth: Payer: Self-pay

## 2022-08-19 DIAGNOSIS — M47816 Spondylosis without myelopathy or radiculopathy, lumbar region: Secondary | ICD-10-CM

## 2022-08-19 DIAGNOSIS — M5416 Radiculopathy, lumbar region: Secondary | ICD-10-CM

## 2022-08-19 DIAGNOSIS — G894 Chronic pain syndrome: Secondary | ICD-10-CM

## 2022-08-19 DIAGNOSIS — M5136 Other intervertebral disc degeneration, lumbar region: Secondary | ICD-10-CM

## 2022-08-19 NOTE — Telephone Encounter (Signed)
Pt is calling requesting a new referral to pain management.  Pt scheduled his CPE for 10/2022. Advise the pt that the referral will be placed and if they request updated noted then we will call him in for an appointment.  Please advise

## 2022-09-22 ENCOUNTER — Encounter (HOSPITAL_BASED_OUTPATIENT_CLINIC_OR_DEPARTMENT_OTHER): Payer: Self-pay | Admitting: Pulmonary Disease

## 2022-09-22 ENCOUNTER — Ambulatory Visit (INDEPENDENT_AMBULATORY_CARE_PROVIDER_SITE_OTHER): Payer: BC Managed Care – PPO | Admitting: Pulmonary Disease

## 2022-09-22 VITALS — BP 146/70 | HR 82 | Temp 98.1°F | Ht 77.0 in | Wt 341.4 lb

## 2022-09-22 DIAGNOSIS — R0602 Shortness of breath: Secondary | ICD-10-CM | POA: Diagnosis not present

## 2022-09-22 DIAGNOSIS — G4733 Obstructive sleep apnea (adult) (pediatric): Secondary | ICD-10-CM | POA: Diagnosis not present

## 2022-09-22 DIAGNOSIS — F17200 Nicotine dependence, unspecified, uncomplicated: Secondary | ICD-10-CM | POA: Diagnosis not present

## 2022-09-22 MED ORDER — NICOTINE 10 MG IN INHA: 1.0000 | RESPIRATORY_TRACT | Status: DC | PRN

## 2022-09-22 NOTE — Progress Notes (Signed)
   Subjective:    Patient ID: Eduardo [name], male    DOB: September 25, 1971, 51 y.o.   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   Lost 26 lbs OV with APP 12/2021 He has severe OSA but has been unable to tolerate CPAP in the past due to claustrophobia, has some degree of PTSD. He continues to smoke about a pack per day .  He complains of shortness of breath on exertion.  Symbicort helps somewhat. He has tried a nicotine patch in the past but this would not stick/stay on his skin  Significant tests/ events reviewed Split 08/2019  Severe, AHI 99/h >> CPAP 20 cm , Lg FF mask NPSG 11/2017  severe OSA with AHI of 83/h and was desaturation of 87%  PFTs 12/2021 FEV1 at 78%, ratio 79, FVC 75%, no significant bronchodilator response, DLCO 84%   Spirometry 04/2018  ratio of 83, and with FVC of 71% suggestive of mild restriction   LDCT 01/2021 emphysema  Review of Systems neg for any significant sore throat, dysphagia, itching, sneezing, nasal congestion or excess/ purulent secretions, fever, chills, sweats, unintended wt loss, pleuritic or exertional cp, hempoptysis, orthopnea pnd or change in chronic leg swelling. Also denies presyncope, palpitations, heartburn, abdominal pain, nausea, vomiting, diarrhea or change in bowel or urinary habits, dysuria,hematuria, rash, arthralgias, visual complaints, headache, numbness weakness or ataxia.     Objective:   Physical Exam  Gen. Pleasant, obese, in no distress ENT - no lesions, no post nasal drip Neck: No JVD, no thyromegaly, no carotid bruits Lungs: no use of accessory muscles, no dullness to percussion, decreased without rales or rhonchi  Cardiovascular: Rhythm regular,  heart sounds  normal, no murmurs or gallops, no peripheral edema Musculoskeletal: No deformities, no cyanosis or clubbing , no tremors       Assessment & Plan:

## 2022-09-22 NOTE — Patient Instructions (Signed)
X sample of Trelegy 100 -1 puff daily Let us know if this works and we can send in prescription  X Rx for Nicotrol Inhaler 1 puff every 2 hours as needed  X BiPAP titration study Okay to use melatonin 10 mg prior to study

## 2022-09-22 NOTE — Assessment & Plan Note (Signed)
Nicotine patch does not stay on his skin.  He has tried Chantix and is concerned about side effects. We will provide him a prescription for Nicotrol inhaler

## 2022-09-22 NOTE — Assessment & Plan Note (Signed)
Lung function is relatively maintained and shortness of breath is quite out of proportion to his lung function. He had cardiology evaluation including coronary CT in 2021 I am not convinced that he has a pulmonary etiology for shortness of breath, will escalate to Trelegy as a sample and see if this provides him any relief

## 2022-09-22 NOTE — Assessment & Plan Note (Signed)
He has severe OSA and has been unable to tolerate CPAP due to claustrophobia.  He is not a candidate for hypoglossal nerve stimulator implant due to high BMI. I was able to convince him to trial BiPAP and we will schedule BiPAP titration study, hopefully he will be able to trial nasal mask with chinstrap

## 2022-11-13 ENCOUNTER — Encounter: Payer: BC Managed Care – PPO | Admitting: Nurse Practitioner

## 2022-11-15 ENCOUNTER — Ambulatory Visit (HOSPITAL_BASED_OUTPATIENT_CLINIC_OR_DEPARTMENT_OTHER): Payer: BC Managed Care – PPO | Attending: Pulmonary Disease | Admitting: Pulmonary Disease

## 2023-01-20 ENCOUNTER — Ambulatory Visit (INDEPENDENT_AMBULATORY_CARE_PROVIDER_SITE_OTHER): Payer: Medicaid Other | Admitting: Family Medicine

## 2023-01-20 ENCOUNTER — Encounter: Payer: Self-pay | Admitting: Family Medicine

## 2023-01-20 VITALS — BP 162/96 | HR 82 | Temp 98.9°F | Resp 18 | Ht 77.0 in | Wt 369.0 lb

## 2023-01-20 DIAGNOSIS — R351 Nocturia: Secondary | ICD-10-CM

## 2023-01-20 DIAGNOSIS — S90811A Abrasion, right foot, initial encounter: Secondary | ICD-10-CM

## 2023-01-20 DIAGNOSIS — R0602 Shortness of breath: Secondary | ICD-10-CM | POA: Diagnosis not present

## 2023-01-20 DIAGNOSIS — R42 Dizziness and giddiness: Secondary | ICD-10-CM

## 2023-01-20 DIAGNOSIS — R600 Localized edema: Secondary | ICD-10-CM | POA: Diagnosis not present

## 2023-01-20 DIAGNOSIS — L089 Local infection of the skin and subcutaneous tissue, unspecified: Secondary | ICD-10-CM

## 2023-01-20 MED ORDER — SULFAMETHOXAZOLE-TRIMETHOPRIM 800-160 MG PO TABS: 1.0000 | ORAL_TABLET | Freq: Two times a day (BID) | ORAL | 0 refills | Status: AC

## 2023-01-20 MED ORDER — FUROSEMIDE 20 MG PO TABS: 20.0000 mg | ORAL_TABLET | Freq: Every day | ORAL | 0 refills | Status: DC

## 2023-01-20 NOTE — Patient Instructions (Signed)
Stop taking Lyrica as we discussed.  Take the 5 days of Lasix.  Finish the full 2-week course of the antibiotic for the wounds on your foot.

## 2023-01-20 NOTE — Progress Notes (Signed)
Acute Office Visit  Subjective:     Patient ID: Eduardo [name], male    DOB: 12/22/1970, 52 y.o.   MRN: [id number]  Chief Complaint  Patient presents with   Leg Swelling   Foot Swelling    HPI Patient is in today for swelling and pain of his legs and feet.  Swelling initially started about a month ago but has worsened significantly over the past 2 weeks.  Patient has underlying stasis dermatitis, but legs have gotten more swollen and darker in color.  Patient believes that this is secondary to an increase in Lyrica from 150 mg to 300 mg twice a day per Dr. Herma Mering with [facility].  Patient has also been experiencing shortness of breath, worsening sleep apnea, and dizziness since this change.  Dizziness and peripheral edema or known side effects of Lyrica which is why patient is suspicious that this is the reason for the change.  Additionally, he has 2 sores on his right foot that has had for quite some time and are taking a very long time to heal.  He has been doing Epsom salt baths and trying to keep them clean.  He denies any fever.  ROS See HPI     Objective:    BP (!) 162/96 (BP Location: Left Arm, Patient Position: Sitting, Cuff Size: Large)   Pulse 82   Temp 98.9 F (37.2 C) (Oral)   Resp 18   Ht 6\' 5"  (1.956 m)   Wt (!) 369 lb (167.4 kg)   SpO2 96%   BMI 43.76 kg/m   Physical Exam Constitutional:      General: He is not in acute distress.    Appearance: Normal appearance.  HENT:     Head: Normocephalic and atraumatic.  Cardiovascular:     Rate and Rhythm: Normal rate and regular rhythm.     Pulses: Normal pulses.     Heart sounds: Normal heart sounds. No murmur heard.    No friction rub. No gallop.  Pulmonary:     Effort: Pulmonary effort is normal.     Breath sounds: Normal breath sounds.  Musculoskeletal:        General: Swelling and tenderness present. Normal range of motion.  Skin:    General: Skin is warm and dry.     Comments: Dark color on lower  legs  Neurological:     Mental Status: He is alert and oriented to person, place, and time.  Psychiatric:        Mood and Affect: Mood normal.      Assessment & Plan:  Bilateral lower extremity edema Assessment & Plan: Starting with lab work today for evaluation of kidney function, A1c, and PSA.  5-day course of Lasix to alleviate swelling to the point that he can fit his compression stockings on his feet.  Encouraged to limit salt and continue with foot elevation.  Patient would like to discontinue Lyrica, he has a follow-up with [facility] later this month to discuss this further with them.  Orders: -     Furosemide; Take 1 tablet (20 mg total) by mouth daily for 5 days.  Dispense: 5 tablet; Refill: 0 -     CBC with Differential/Platelet; Future -     Comprehensive metabolic panel; Future -     Hemoglobin A1c; Future -     PSA; Future  Shortness of breath -     EKG 12-Lead -     Furosemide; Take 1 tablet (20 mg total)  by mouth daily for 5 days.  Dispense: 5 tablet; Refill: 0 -     CBC with Differential/Platelet; Future -     Comprehensive metabolic panel; Future -     Hemoglobin A1c; Future -     PSA; Future  Dizziness -     EKG 12-Lead -     Furosemide; Take 1 tablet (20 mg total) by mouth daily for 5 days.  Dispense: 5 tablet; Refill: 0 -     CBC with Differential/Platelet; Future -     Comprehensive metabolic panel; Future -     Hemoglobin A1c; Future -     PSA; Future  Nocturia Assessment & Plan: Starting with A1c and PSA to look for reasons of nocturia.  Do not want to start medication at this time without first identifying cause of shortness of breath and peripheral edema.  May consider tamsulosin or something like trazodone or mirtazapine depending on what current workup shows.  Orders: -     Hemoglobin A1c; Future -     PSA; Future  Infected abrasion of right foot, initial encounter -     Sulfamethoxazole-Trimethoprim; Take 1 tablet by mouth 2 (two) times daily  for 14 days.  Dispense: 28 tablet; Refill: 0  Starting workup today with lab work.  Starting course of furosemide 20 mg daily for 5 days to reduce swelling.  Patient is discontinuing Lyrica.  Will send message with lab results and to discuss next steps.  Also starting 14-day course of Bactrim twice a day for infected sores on his right foot.  Encourage patient to schedule follow-up or send a message if symptoms get worse.  Otherwise, return for annual physical.  Return in about 2 months (around 03/22/2023) for annual physical, fasting blood work 1 week before.  I spent 45 minutes on the day of the encounter to include pre-visit record review, face-to-face time with the patient and post visit ordering of test.  Velva Harman, PA

## 2023-01-20 NOTE — Assessment & Plan Note (Signed)
Starting with lab work today for evaluation of kidney function, A1c, and PSA.  5-day course of Lasix to alleviate swelling to the point that he can fit his compression stockings on his feet.  Encouraged to limit salt and continue with foot elevation.  Patient would like to discontinue Lyrica, he has a follow-up with [facility] later this month to discuss this further with them.

## 2023-01-20 NOTE — Assessment & Plan Note (Signed)
Starting with A1c and PSA to look for reasons of nocturia.  Do not want to start medication at this time without first identifying cause of shortness of breath and peripheral edema.  May consider tamsulosin or something like trazodone or mirtazapine depending on what current workup shows.

## 2023-01-21 LAB — COMPREHENSIVE METABOLIC PANEL
ALT: 14 IU/L (ref 0–44)
AST: 18 IU/L (ref 0–40)
Albumin/Globulin Ratio: 1.3 (ref 1.2–2.2)
Albumin: 4.1 g/dL (ref 3.8–4.9)
Alkaline Phosphatase: 122 IU/L — ABNORMAL HIGH (ref 44–121)
BUN/Creatinine Ratio: 13 (ref 9–20)
BUN: 17 mg/dL (ref 6–24)
Bilirubin Total: 0.5 mg/dL (ref 0.0–1.2)
CO2: 25 mmol/L (ref 20–29)
Calcium: 9 mg/dL (ref 8.7–10.2)
Chloride: 101 mmol/L (ref 96–106)
Creatinine, Ser: 1.26 mg/dL (ref 0.76–1.27)
Globulin, Total: 3.2 g/dL (ref 1.5–4.5)
Glucose: 127 mg/dL — ABNORMAL HIGH (ref 70–99)
Potassium: 4.3 mmol/L (ref 3.5–5.2)
Sodium: 138 mmol/L (ref 134–144)
Total Protein: 7.3 g/dL (ref 6.0–8.5)
eGFR: 68 mL/min/{1.73_m2} (ref >59)

## 2023-01-21 LAB — CBC WITH DIFFERENTIAL/PLATELET
Basophils Absolute: 0.1 10*3/uL (ref 0.0–0.2)
Basos: 1 %
EOS (ABSOLUTE): 0.5 10*3/uL — ABNORMAL HIGH (ref 0.0–0.4)
Eos: 5 %
Hematocrit: 44.8 % (ref 37.5–51.0)
Hemoglobin: 15.5 g/dL (ref 13.0–17.7)
Immature Grans (Abs): 0.1 10*3/uL (ref 0.0–0.1)
Immature Granulocytes: 1 %
Lymphocytes Absolute: 3 10*3/uL (ref 0.7–3.1)
Lymphs: 29 %
MCH: 31.6 pg (ref 26.6–33.0)
MCHC: 34.6 g/dL (ref 31.5–35.7)
MCV: 91 fL (ref 79–97)
Monocytes Absolute: 0.7 10*3/uL (ref 0.1–0.9)
Monocytes: 6 %
Neutrophils Absolute: 6.2 10*3/uL (ref 1.4–7.0)
Neutrophils: 58 %
Platelets: 179 10*3/uL (ref 150–450)
RBC: 4.9 x10E6/uL (ref 4.14–5.80)
RDW: 15.7 % — ABNORMAL HIGH (ref 11.6–15.4)
WBC: 10.6 10*3/uL (ref 3.4–10.8)

## 2023-01-21 LAB — HEMOGLOBIN A1C
Est. average glucose Bld gHb Est-mCnc: 108 mg/dL
Hgb A1c MFr Bld: 5.4 % (ref 4.8–5.6)

## 2023-01-21 LAB — PSA: Prostate Specific Ag, Serum: 0.3 ng/mL (ref 0.0–4.0)

## 2023-02-04 ENCOUNTER — Telehealth: Payer: Self-pay

## 2023-02-04 DIAGNOSIS — J3089 Other allergic rhinitis: Secondary | ICD-10-CM

## 2023-02-04 DIAGNOSIS — J310 Chronic rhinitis: Secondary | ICD-10-CM

## 2023-02-04 MED ORDER — MONTELUKAST SODIUM 10 MG PO TABS
10.0000 mg | ORAL_TABLET | Freq: Every day | ORAL | 3 refills | Status: DC
Start: 2023-02-04 — End: 2023-02-08

## 2023-02-04 NOTE — Telephone Encounter (Signed)
Patient states it's the generic for Singulair.

## 2023-02-04 NOTE — Telephone Encounter (Signed)
Patient called office to get a refill on medication for allergies, this medication is no longer on patients medication list, please advise, thanks.

## 2023-02-04 NOTE — Telephone Encounter (Signed)
Refill for montelukast 10 mg daily sent to [facility] [address].

## 2023-02-05 NOTE — Telephone Encounter (Signed)
Please resend to correct pharmacy  [id number] - [place], [place] - [address]

## 2023-02-08 MED ORDER — MONTELUKAST SODIUM 10 MG PO TABS
10.0000 mg | ORAL_TABLET | Freq: Every day | ORAL | 3 refills | Status: AC
Start: 2023-02-08 — End: ?

## 2023-02-08 NOTE — Addendum Note (Signed)
Addended by: Tonny Bollman on: 02/08/2023 11:16 AM   Modules accepted: Orders

## 2023-02-08 NOTE — Telephone Encounter (Signed)
Prescription sent to corrected pharmacy.

## 2023-03-02 ENCOUNTER — Other Ambulatory Visit: Payer: Self-pay

## 2023-03-02 DIAGNOSIS — Z0000 Encounter for general adult medical examination without abnormal findings: Secondary | ICD-10-CM

## 2023-03-02 DIAGNOSIS — Z130 Encounter for screening for diseases of the blood and blood-forming organs and certain disorders involving the immune mechanism: Secondary | ICD-10-CM

## 2023-03-09 ENCOUNTER — Telehealth: Payer: Self-pay | Admitting: Adult Health

## 2023-03-09 NOTE — Telephone Encounter (Signed)
Pharmacy called to inform the doctor that they received a request but the doctor did not indicate which one was needed of the Singulair.  Please call to verify at [phone]

## 2023-03-10 NOTE — Telephone Encounter (Signed)
Spoke with [place]. Patient has received Singular from pcp nfn

## 2023-03-16 ENCOUNTER — Other Ambulatory Visit: Payer: Medicaid Other

## 2023-03-16 DIAGNOSIS — Z0000 Encounter for general adult medical examination without abnormal findings: Secondary | ICD-10-CM

## 2023-03-16 DIAGNOSIS — Z130 Encounter for screening for diseases of the blood and blood-forming organs and certain disorders involving the immune mechanism: Secondary | ICD-10-CM

## 2023-03-17 LAB — HEMOGLOBIN A1C
Est. average glucose Bld gHb Est-mCnc: 91 mg/dL
Hgb A1c MFr Bld: 4.8 % (ref 4.8–5.6)

## 2023-03-17 LAB — CBC
Hematocrit: 46.5 % (ref 37.5–51.0)
Hemoglobin: 15.8 g/dL (ref 13.0–17.7)
MCH: 30.7 pg (ref 26.6–33.0)
MCHC: 34 g/dL (ref 31.5–35.7)
MCV: 90 fL (ref 79–97)
Platelets: 187 10*3/uL (ref 150–450)
RBC: 5.15 x10E6/uL (ref 4.14–5.80)
RDW: 14.5 % (ref 11.6–15.4)
WBC: 9.3 10*3/uL (ref 3.4–10.8)

## 2023-03-17 LAB — COMPREHENSIVE METABOLIC PANEL
ALT: 10 IU/L (ref 0–44)
AST: 16 IU/L (ref 0–40)
Albumin/Globulin Ratio: 1.5 (ref 1.2–2.2)
Albumin: 4.4 g/dL (ref 3.8–4.9)
Alkaline Phosphatase: 130 IU/L — ABNORMAL HIGH (ref 44–121)
BUN/Creatinine Ratio: 12 (ref 9–20)
BUN: 15 mg/dL (ref 6–24)
Bilirubin Total: 0.6 mg/dL (ref 0.0–1.2)
CO2: 23 mmol/L (ref 20–29)
Calcium: 9.1 mg/dL (ref 8.7–10.2)
Chloride: 97 mmol/L (ref 96–106)
Creatinine, Ser: 1.23 mg/dL (ref 0.76–1.27)
Globulin, Total: 2.9 g/dL (ref 1.5–4.5)
Glucose: 111 mg/dL — ABNORMAL HIGH (ref 70–99)
Potassium: 4.1 mmol/L (ref 3.5–5.2)
Sodium: 135 mmol/L (ref 134–144)
Total Protein: 7.3 g/dL (ref 6.0–8.5)
eGFR: 70 mL/min/{1.73_m2} (ref >59)

## 2023-03-17 LAB — LIPID PANEL
Chol/HDL Ratio: 4.8 ratio (ref 0.0–5.0)
Cholesterol, Total: 111 mg/dL (ref 100–199)
HDL: 23 mg/dL — ABNORMAL LOW (ref >39)
LDL Chol Calc (NIH): 45 mg/dL (ref 0–99)
Triglycerides: 276 mg/dL — ABNORMAL HIGH (ref 0–149)
VLDL Cholesterol Cal: 43 mg/dL — ABNORMAL HIGH (ref 5–40)

## 2023-03-17 LAB — TSH: TSH: 2.15 u[IU]/mL (ref 0.450–4.500)

## 2023-03-17 LAB — VITAMIN D 25 HYDROXY (VIT D DEFICIENCY, FRACTURES): Vit D, 25-Hydroxy: 20.6 ng/mL — ABNORMAL LOW (ref 30.0–100.0)

## 2023-03-22 ENCOUNTER — Encounter: Payer: Medicaid Other | Admitting: Family Medicine

## 2023-03-23 ENCOUNTER — Encounter: Payer: Self-pay | Admitting: Family Medicine

## 2023-03-23 ENCOUNTER — Ambulatory Visit (INDEPENDENT_AMBULATORY_CARE_PROVIDER_SITE_OTHER): Payer: Medicaid Other | Admitting: Family Medicine

## 2023-03-23 VITALS — BP 141/91 | HR 84 | Resp 18 | Ht 77.0 in | Wt 340.0 lb

## 2023-03-23 DIAGNOSIS — F411 Generalized anxiety disorder: Secondary | ICD-10-CM

## 2023-03-23 DIAGNOSIS — Z0000 Encounter for general adult medical examination without abnormal findings: Secondary | ICD-10-CM

## 2023-03-23 DIAGNOSIS — F5104 Psychophysiologic insomnia: Secondary | ICD-10-CM | POA: Insufficient documentation

## 2023-03-23 DIAGNOSIS — E559 Vitamin D deficiency, unspecified: Secondary | ICD-10-CM | POA: Diagnosis not present

## 2023-03-23 DIAGNOSIS — Z1211 Encounter for screening for malignant neoplasm of colon: Secondary | ICD-10-CM

## 2023-03-23 DIAGNOSIS — Z1212 Encounter for screening for malignant neoplasm of rectum: Secondary | ICD-10-CM

## 2023-03-23 DIAGNOSIS — Z122 Encounter for screening for malignant neoplasm of respiratory organs: Secondary | ICD-10-CM

## 2023-03-23 DIAGNOSIS — R1032 Left lower quadrant pain: Secondary | ICD-10-CM | POA: Insufficient documentation

## 2023-03-23 MED ORDER — ESCITALOPRAM OXALATE 10 MG PO TABS
10.0000 mg | ORAL_TABLET | Freq: Every day | ORAL | 1 refills | Status: DC
Start: 2023-03-23 — End: 2023-04-20

## 2023-03-23 MED ORDER — VITAMIN D (ERGOCALCIFEROL) 1.25 MG (50000 UNIT) PO CAPS
50000.0000 [IU] | ORAL_CAPSULE | ORAL | 0 refills | Status: DC
Start: 2023-03-23 — End: 2023-06-15

## 2023-03-23 MED ORDER — QUETIAPINE FUMARATE 25 MG PO TABS
25.0000 mg | ORAL_TABLET | Freq: Every day | ORAL | 1 refills | Status: AC
Start: 2023-03-23 — End: ?

## 2023-03-23 NOTE — Assessment & Plan Note (Signed)
PHQ-9 score 13, GAD-7 score 15.  Patient has tried and failed Zoloft, Prozac, Cymbalta.  In the past, has also tried buspirone.  He does find himself worrying often about finances and does not know if therapy is a viable option for that reason.  He is agreeable to a trial of Lexapro 10 mg daily.  Additionally, trial quetiapine 25 mg daily at bedtime to help with sleep and anxiety.  Follow-up in about 6 weeks for efficacy.  Denies current SI/HI, we discussed that if he does start to experience any of the symptoms he is to discontinue medication immediately.  Patient verbalized understanding and is agreeable to this plan.

## 2023-03-23 NOTE — Assessment & Plan Note (Signed)
Start quetiapine 25 mg daily at bedtime for sleep and anxiety.  Will continue to monitor.

## 2023-03-23 NOTE — Progress Notes (Signed)
Complete physical exam  Patient: Eduardo [name]   DOB: 13-May-1971   52 y.o. Male  MRN: [id number]  Subjective:    Chief Complaint  Patient presents with   Annual Exam    Eduardo [name] is a 52 y.o. male who presents today for a complete physical exam. He reports consuming a general diet.  He generally feels poorly. He reports sleeping poorly.  His biggest concern is that his anxiety is so high that he finds himself worrying throughout the day and unable to sleep because of it.  Since he is taking Percocet as prescribed by pain management, he states that no one in the past has been willing to give him anything for his anxiety.  Years ago, he did try Zoloft and Prozac which were not effective.  He has also tried Cymbalta which caused homicidal thoughts.  He has also taken Xanax as needed in the past with improvement of his anxiety in the short-term. He also complains of waxing and waning LLQ pain.  It does not seem to be associated with eating or with bowel movements.  There are no aggravating factors that he can identify.  This has been ongoing for several months now.   Most recent fall risk assessment:    03/23/2023    9:23 AM  Fall Risk   Falls in the past year? 0  Number falls in past yr: 0  Injury with Fall? 0  Risk for fall due to : No Fall Risks  Follow up Falls evaluation completed     Most recent depression and anxiety screenings:    03/23/2023    9:22 AM 01/20/2023    1:41 PM  PHQ 2/9 Scores  PHQ - 2 Score 3 3  PHQ- 9 Score 13 14      03/23/2023    9:22 AM 01/20/2023    1:41 PM 05/01/2022    9:33 AM 01/05/2022    2:09 PM  GAD 7 : Generalized Anxiety Score  Nervous, Anxious, on Edge 3 1 1 1   Control/stop worrying 3 3 1 2   Worry too much - different things 2 3 1 2   Trouble relaxing 3 3 2 3   Restless 2 3 2 2   Easily annoyed or irritable 2 1 2 3   Afraid - awful might happen 1 0 0 1  Total GAD 7 Score 16 14 9 14   Anxiety Difficulty Extremely difficult Very  difficult  Extremely difficult    Patient Active Problem List   Diagnosis Date Noted   Left lower quadrant abdominal pain 03/23/2023   Psychophysiological insomnia 03/23/2023   Vitamin D deficiency 03/23/2023   Nocturia 01/20/2023   Chronic rhinitis 12/25/2021   Lymphedema of lower extremity 09/29/2021   Insertional Achilles tendinopathy 08/07/2021   Arthralgia of left ankle 07/04/2021   Chronic obstructive pulmonary disease (HCC) 01/14/2021   Hypertriglyceridemia 04/12/2020   Recurrent infections 03/07/2019   Blepharitis of right upper eyelid 03/07/2019   Body mass index 40.0-44.9, adult (HCC) 04/19/2018   Bilateral lower extremity edema 04/07/2018   Stasis dermatitis of both legs 04/07/2018   Low libido 02/09/2018   OSA on CPAP 02/03/2018   Chronic pansinusitis 12/07/2017   Hypertrophy, nasal, turbinate 12/07/2017   Deviated septum 11/11/2017   Umbilical hernia without obstruction and without gangrene 11/11/2017   Lumbar degenerative disc disease 08/02/2017   Chronic folliculitis 04/10/2017   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   Major depression 12/26/2012   Generalized anxiety disorder 12/26/2012   Opiate  dependence (HCC) 12/24/2012   Benzodiazepine dependence (HCC) 12/24/2012   HTN (hypertension) 12/24/2012   Pilonidal cyst 11/22/2012   Morbid obesity (HCC) 09/09/2010   Nicotine dependence with current use 09/09/2010    Past Surgical History:  Procedure Laterality Date   Irrigation and debridement of back abscess     MASS EXCISION Right 06/14/2019   Procedure: EXCISION BENIGN LESION RIGHT FOOT;  Surgeon: Park Liter, DPM;  Location: [facility];  Service: Podiatry;  Laterality: Right;   NASAL SEPTOPLASTY W/ TURBINOPLASTY [name] 02/03/2018   Procedure: NASAL SEPTOPLASTY WITH TURBINATE REDUCTION;  Surgeon: Suzanna Obey, MD;  Location: [facility] OR;  Service: ENT;  Laterality: [name];   PILONIDAL CYST DRAINAGE [name] 04/10/2017   Procedure: IRRIGATION AND DEBRIDEMENT BACK  ABSCESS;  Surgeon: Karie Soda, MD;  Location: [facility];  Service: General;  Laterality: [name];   SINUS ENDO WITH FUSION [name] 02/03/2018   Procedure: ENDOSCOPIC SINUS SURGERY WITH FUSION;  Surgeon: Suzanna Obey, MD;  Location: [facility] OR;  Service: ENT;  Laterality: [name];   THORACIC OUTLET SURGERY  2000   TOE ARTHROPLASTY Right 06/14/2019   Procedure: HALLUX ARTHROPLASTY RIGHT FOOT WITH TISSUE TRANSER EXCISION OF SESAMOID BONE;  Surgeon: Park Liter, DPM;  Location: [facility];  Service: Podiatry;  Laterality: Right;   WRIST FUSION  2002   Social History   Tobacco Use   Smoking status: Every Day    Packs/day: 1.00    Years: 33.00    Additional pack years: 0.00    Total pack years: 33.00    Types: Cigarettes    Passive exposure: Never   Smokeless tobacco: Never   Tobacco comments:    1pack per day 09/22/2022  Vaping Use   Vaping Use: Former  Substance Use Topics   Alcohol use: No    Alcohol/week: 0.0 standard drinks of alcohol    Comment: Rarely   Drug use: No   Family History  Problem Relation Age of Onset   Cancer Maternal Grandmother        breast, ovarian & colon   Hypertension Mother    Diabetes Mother    Hypertension Father    Heart disease Father    Stroke Paternal Grandfather    Neuropathy Neg Hx    Allergies  Allergen Reactions   Pollen Extract Cough   Dust Mite Extract Other (See Comments)   Other Other (See Comments)    Ragweed      Patient Care Team: Melida Quitter, PA as PCP - General (Family Medicine) Jodelle Red, MD as PCP - Cardiology (Cardiology) Nita Sells, MD (Dermatology) Wynn Banker, Victorino Sparrow, MD as Consulting Physician (Physical Medicine and Rehabilitation) Nadara Mustard, MD as Consulting Physician (Orthopedic Surgery) Carlis Abbott, Drema Pry, MD as Consulting Physician (Physical Medicine and Rehabilitation) Coralyn Helling, MD as Consulting Physician (Pulmonary Disease) Park Liter, DPM (Inactive) as Consulting Physician (Podiatry) Daiva Eves, Lisette Grinder, MD as Consulting Physician (Infectious Diseases) Janalyn Harder, MD (Inactive) as Consulting Physician (Dermatology)   Outpatient Medications Prior to Visit  Medication Sig   Acetaminophen (TYLENOL ARTHRITIS PAIN PO) Take 600 mg by mouth 3 (three) times daily.   montelukast (SINGULAIR) 10 MG tablet Take 1 tablet (10 mg total) by mouth at bedtime.   oxyCODONE-acetaminophen (PERCOCET) 10-325 MG tablet Take 1 tablet by mouth every 6 (six) hours as needed for pain.   [DISCONTINUED] albuterol (PROVENTIL) (2.5 MG/3ML) 0.083% nebulizer solution Take 3 mLs (2.5 mg total) by nebulization every 6 (six) hours as needed for wheezing  or shortness of breath.   [DISCONTINUED] albuterol (VENTOLIN HFA) 108 (90 Base) MCG/ACT inhaler Inhale 2 puffs into the lungs every 6 (six) hours as needed for wheezing or shortness of breath.   [DISCONTINUED] budesonide-formoterol (SYMBICORT) 80-4.5 MCG/ACT inhaler Inhale 2 puffs into the lungs daily.   [DISCONTINUED] [employer] NICOTINE TRANSDERMAL SYS 14 MG/24HR patch 14 mg daily.   [DISCONTINUED] Elastic Bandages & Supports (MEDICAL COMPRESSION STOCKINGS) MISC Wear one pair compression stockings daily while awake. Take off at bedtime.   [DISCONTINUED] fluticasone (FLONASE) 50 MCG/ACT nasal spray 1 spray each nostril twice daily after sinus rinses   [DISCONTINUED] furosemide (LASIX) 20 MG tablet Take 1 tablet (20 mg total) by mouth daily for 5 days.   [DISCONTINUED] oxyCODONE-acetaminophen (PERCOCET) 7.5-325 MG tablet Take 1 tablet by mouth every 4 (four) hours as needed. Do Not Fill Before 12/16/2021   [DISCONTINUED] pregabalin (LYRICA) 300 MG capsule TAKE 1 CAPSULE BY MOUTH TWICE A DAY   Facility-Administered Medications Prior to Visit  Medication Dose Route Frequency Provider   nicotine (NICOTROL) 10 MG inhaler 1 continuous puffing  1 continuous puffing Inhalation PRN Oretha Milch, MD    Review of Systems  Constitutional:  Negative for chills, fever and  malaise/fatigue.  HENT:  Positive for congestion (Seasonal allergies). Negative for hearing loss.   Eyes:  Negative for blurred vision and double vision.  Respiratory:  Negative for cough and shortness of breath.   Cardiovascular:  Negative for chest pain, palpitations and leg swelling.  Gastrointestinal:  Negative for abdominal pain, constipation, diarrhea and heartburn.  Genitourinary:  Negative for frequency and urgency.  Musculoskeletal:  Negative for myalgias and neck pain.  Neurological:  Negative for headaches.  Endo/Heme/Allergies:  Negative for polydipsia.  Psychiatric/Behavioral:  Negative for depression. The patient is nervous/anxious and has insomnia.       Objective:    BP (!) 141/91 (BP Location: Right Arm, Patient Position: Sitting, Cuff Size: Large)   Pulse 84   Resp 18   Ht 6\' 5"  (1.956 m)   Wt (!) 340 lb (154.2 kg)   SpO2 96%   BMI 40.32 kg/m    Physical Exam Constitutional:      General: He is not in acute distress.    Appearance: Normal appearance.  HENT:     Head: Normocephalic and atraumatic.     Right Ear: Tympanic membrane, ear canal and external ear normal. There is no impacted cerumen.     Left Ear: Tympanic membrane, ear canal and external ear normal. There is no impacted cerumen.     Nose: Nose normal. No congestion or rhinorrhea.     Mouth/Throat:     Mouth: Mucous membranes are moist.     Pharynx: Oropharynx is clear.  Eyes:     Conjunctiva/sclera: Conjunctivae normal.     Pupils: Pupils are equal, round, and reactive to light.     Comments: Does not wear glasses or contacts  Neck:     Thyroid: No thyroid mass, thyromegaly or thyroid tenderness.  Cardiovascular:     Rate and Rhythm: Normal rate and regular rhythm.     Pulses: Normal pulses.     Heart sounds: Normal heart sounds. No murmur heard.    No friction rub. No gallop.  Pulmonary:     Effort: Pulmonary effort is normal. No respiratory distress.     Breath sounds: Normal breath  sounds. No wheezing, rhonchi or rales.  Abdominal:     General: Bowel sounds are normal. There is no  distension.     Palpations: Abdomen is soft. There is no mass.     Tenderness: There is abdominal tenderness (LLQ, no palpable defect).     Hernia: No hernia is present.  Musculoskeletal:        General: No swelling. Normal range of motion.     Cervical back: Normal range of motion and neck supple.  Lymphadenopathy:     Cervical: No cervical adenopathy.  Skin:    General: Skin is warm and dry.     Findings: Rash (Bilateral stasis dermatitis of lower legs) present.  Neurological:     Mental Status: He is alert and oriented to person, place, and time.     Cranial Nerves: No cranial nerve deficit.     Motor: No weakness.     Deep Tendon Reflexes: Reflexes normal.  Psychiatric:        Mood and Affect: Mood normal.       Assessment & Plan:    Routine Health Maintenance and Physical Exam  Immunization History  Administered Date(s) Administered   Tdap 07/30/2017    Health Maintenance  Topic Date Due   COVID-19 Vaccine (1) Never done   Colonoscopy  Never done   Zoster Vaccines- Shingrix (1 of 2) Never done   Lung Cancer Screening  01/24/2022   COLON CANCER SCREENING ANNUAL FOBT  01/06/2023   DTaP/Tdap/Td (2 - Td or Tdap) 07/31/2027   Hepatitis C Screening  Completed   HIV Screening  Completed   HPV VACCINES  Aged Out   INFLUENZA VACCINE  Discontinued   We discussed the preventative care that Eduardo [name] is due for including colonoscopy, shingles vaccines, lung cancer screening.  Patient is agreeable to Cologuard, low-dose chest CT.  Discussed health benefits of physical activity, and encouraged him to engage in regular exercise appropriate for his age and condition.  Wellness examination  Psychophysiological insomnia Assessment & Plan: Start quetiapine 25 mg daily at bedtime for sleep and anxiety.  Will continue to monitor.  Orders: -     QUEtiapine Fumarate; Take 1 tablet  (25 mg total) by mouth at bedtime.  Dispense: 60 tablet; Refill: 1  Generalized anxiety disorder Assessment & Plan: PHQ-9 score 13, GAD-7 score 15.  Patient has tried and failed Zoloft, Prozac, Cymbalta.  In the past, has also tried buspirone.  He does find himself worrying often about finances and does not know if therapy is a viable option for that reason.  He is agreeable to a trial of Lexapro 10 mg daily.  Additionally, trial quetiapine 25 mg daily at bedtime to help with sleep and anxiety.  Follow-up in about 6 weeks for efficacy.  Denies current SI/HI, we discussed that if he does start to experience any of the symptoms he is to discontinue medication immediately.  Patient verbalized understanding and is agreeable to this plan.  Orders: -     Escitalopram Oxalate; Take 1 tablet (10 mg total) by mouth daily.  Dispense: 60 tablet; Refill: 1  Vitamin D deficiency Assessment & Plan: Prescription strength vitamin D 50,000 units weekly for 12 weeks, then switch to over-the-counter vitamin D3 2000 unit daily supplement to maintain.  Orders: -     Vitamin D (Ergocalciferol); Take 1 capsule (50,000 Units total) by mouth every 7 (seven) days.  Dispense: 12 capsule; Refill: 0  Screening for colorectal cancer -     Cologuard  Screening for lung cancer -     CT CHEST LUNG CANCER SCREENING LOW DOSE WO CONTRAST; Future  Left lower quadrant abdominal pain Assessment & Plan: Last abdominal CT approximately 1 year ago revealed mildly enlarged spleen and small fat-containing inguinal hernias.  Eduardo [name] recommended following up with GI if he continued to experience pain/bloody stools.  Repeat CT to evaluate for any progression, then likely will recommend follow-up with GI.  Orders: -     CT ABDOMEN PELVIS W CONTRAST; Future    Return in about 6 weeks (around 05/04/2023) for follow-up for mood and sleep, started Lexapro and Seroquel.     Melida Quitter, PA

## 2023-03-23 NOTE — Assessment & Plan Note (Signed)
Last abdominal CT approximately 1 year ago revealed mildly enlarged spleen and small fat-containing inguinal hernias.  Maritza recommended following up with GI if he continued to experience pain/bloody stools.  Repeat CT to evaluate for any progression, then likely will recommend follow-up with GI.

## 2023-03-23 NOTE — Assessment & Plan Note (Signed)
Prescription strength vitamin D 50,000 units weekly for 12 weeks, then switch to over-the-counter vitamin D3 2000 unit daily supplement to maintain.

## 2023-04-06 ENCOUNTER — Telehealth: Payer: Self-pay

## 2023-04-06 ENCOUNTER — Encounter: Payer: Self-pay | Admitting: Family Medicine

## 2023-04-06 NOTE — Telephone Encounter (Signed)
Sent a message on MyChart to discuss options

## 2023-04-06 NOTE — Telephone Encounter (Signed)
Pt is calling about Anxiety medication   escitalopram (LEXAPRO) 10 MG tablet   Causing daily headache, grinding teeth, Pt feels really tension.  Pt started medication a couple of weeks ago. Please follow up with patient as to what he should do

## 2023-04-08 LAB — COLOGUARD: COLOGUARD: NEGATIVE

## 2023-04-20 ENCOUNTER — Ambulatory Visit (INDEPENDENT_AMBULATORY_CARE_PROVIDER_SITE_OTHER): Payer: Medicaid Other | Admitting: Family Medicine

## 2023-04-20 ENCOUNTER — Encounter: Payer: Self-pay | Admitting: Family Medicine

## 2023-04-20 VITALS — BP 128/86 | HR 69 | Resp 18 | Ht 77.0 in | Wt 342.0 lb

## 2023-04-20 DIAGNOSIS — R351 Nocturia: Secondary | ICD-10-CM

## 2023-04-20 DIAGNOSIS — M5136 Other intervertebral disc degeneration, lumbar region: Secondary | ICD-10-CM | POA: Diagnosis not present

## 2023-04-20 DIAGNOSIS — F5104 Psychophysiologic insomnia: Secondary | ICD-10-CM | POA: Diagnosis not present

## 2023-04-20 DIAGNOSIS — F411 Generalized anxiety disorder: Secondary | ICD-10-CM

## 2023-04-20 MED ORDER — FLUOXETINE HCL 10 MG PO TABS
10.0000 mg | ORAL_TABLET | Freq: Every day | ORAL | 1 refills | Status: DC
Start: 2023-04-20 — End: 2023-06-30

## 2023-04-20 NOTE — Assessment & Plan Note (Addendum)
Last A1c 4.8, PSA 0.3.  Patient is agreeable to referral to see urology to complete workup of nocturia which has been ongoing for several years.  I am hesitant to start something like tamsulosin without knowing the definitive cause of ongoing nocturia.

## 2023-04-20 NOTE — Assessment & Plan Note (Signed)
Continue quetiapine 25 mg daily at bedtime for sleep and anxiety.  Will continue to monitor.  May consider making medication changes once nocturia has been fully evaluated.

## 2023-04-20 NOTE — Progress Notes (Signed)
Established Patient Office Visit  Subjective   Patient ID: Eduardo [name], male    DOB: 1970/10/30  Age: 52 y.o. MRN: [id number]  Chief Complaint  Patient presents with   Anxiety   Depression   Back Pain    HPI Eduardo [name] is a 52 y.o. male presenting today for follow up of sleep, mood. Insomnia: He is taking Seroquel nightly, denies side effects.  This has helped significantly with sleep induction, but he still struggles with waking up to urinate about every hour throughout the night.  Additionally, his back pain is causing significant difficulties staying asleep right now.  He has not seen urology since 2019, states that the provider that he saw at the time tested his urine and that was the extent of his workup.  At the time, he was told that nothing is wrong.  He produces around 10-12 ounces of fluid each time that he gets up to use the bathroom throughout the night.  Mood: Patient is here to follow up for anxiety and depression, currently managing with Lexapro as started at last appointment. Taking medication without side effects, reports excellent compliance with treatment. Denies mood changes or SI/HI. He feels mood is  the same  last visit, the Lexapro has not improved his mood at all.     04/20/2023    9:49 AM 03/23/2023    9:22 AM 01/20/2023    1:41 PM  Depression screen PHQ 2/9  Decreased Interest 2 2 2   Down, Depressed, Hopeless 0 1 1  PHQ - 2 Score 2 3 3   Altered sleeping 3 3 3   Tired, decreased energy 3 2 3   Change in appetite 1 1 1   Feeling bad or failure about yourself  0 0 0  Trouble concentrating 2 3 3   Moving slowly or fidgety/restless 0 1 1  Suicidal thoughts 0 0 0  PHQ-9 Score 11 13 14   Difficult doing work/chores Very difficult Extremely dIfficult Very difficult       04/20/2023    9:49 AM 03/23/2023    9:22 AM 01/20/2023    1:41 PM 05/01/2022    9:33 AM  GAD 7 : Generalized Anxiety Score  Nervous, Anxious, on Edge 2 3 1 1   Control/stop worrying 2 3  3 1   Worry too much - different things 2 2 3 1   Trouble relaxing 3 3 3 2   Restless 2 2 3 2   Easily annoyed or irritable 1 2 1 2   Afraid - awful might happen 0 1 0 0  Total GAD 7 Score 12 16 14 9   Anxiety Difficulty Very difficult Extremely difficult Very difficult    ROS Negative unless otherwise noted in HPI   Objective:     BP 128/86 (BP Location: Left Arm, Patient Position: Sitting, Cuff Size: Large)   Pulse 69   Resp 18   Ht 6\' 5"  (1.956 m)   Wt (!) 342 lb (155.1 kg)   SpO2 97%   BMI 40.56 kg/m   Physical Exam Vitals reviewed.  Constitutional:      General: He is not in acute distress.    Appearance: Normal appearance. He is not ill-appearing.  HENT:     Head: Normocephalic and atraumatic.     Nose: Nose normal.  Eyes:     Conjunctiva/sclera: Conjunctivae normal.  Pulmonary:     Effort: Pulmonary effort is normal.  Musculoskeletal:     Cervical back: Normal range of motion.  Skin:    Coloration:  Skin is not jaundiced or pale.     Findings: No bruising.  Neurological:     Mental Status: He is alert and oriented to person, place, and time.  Psychiatric:        Mood and Affect: Mood normal.      Assessment & Plan:  Generalized anxiety disorder Assessment & Plan: PHQ-9 score 11, GAD-7 score of 12.  Both have improved slightly, but patient has not felt any efficacy of Lexapro.  We discussed management options including continuing Lexapro, switching to Prozac, or switching to an SNRI.  Patient would like to switch medications as Lexapro has not done anything for him and would like to start Prozac.  Discussed mechanism of action and that it is in the same class as Lexapro.  Discussed how to switch from Lexapro to Prozac by tapering off of Lexapro completely before starting Prozac.  Patient verbalized understanding, instructions also written in AVS.  To give him time to taper off of Lexapro as well as the full 6 weeks to assess Prozac efficacy, follow-up in about 8  weeks.  Orders: -     FLUoxetine HCl; Take 1 tablet (10 mg total) by mouth daily.  Dispense: 90 tablet; Refill: 1  Psychophysiological insomnia Assessment & Plan: Continue quetiapine 25 mg daily at bedtime for sleep and anxiety.  Will continue to monitor.  May consider making medication changes once nocturia has been fully evaluated.   Lumbar degenerative disc disease Assessment & Plan: Saw [facility] 04/19/2023. MRI and CT ordered for 04/21/2023 and will follow up with them afterward.   Nocturia Assessment & Plan: Last A1c 4.8, PSA 0.3.  Patient is agreeable to referral to see urology to complete workup of nocturia which has been ongoing for several years.  I am hesitant to start something like tamsulosin without knowing the definitive cause of ongoing nocturia.  Orders: -     Ambulatory referral to Urology    Return in about 2 months (around 06/21/2023) for follow-up for mood, sleep.    Melida Quitter, PA

## 2023-04-20 NOTE — Assessment & Plan Note (Signed)
PHQ-9 score 11, GAD-7 score of 12.  Both have improved slightly, but patient has not felt any efficacy of Lexapro.  We discussed management options including continuing Lexapro, switching to Prozac, or switching to an SNRI.  Patient would like to switch medications as Lexapro has not done anything for him and would like to start Prozac.  Discussed mechanism of action and that it is in the same class as Lexapro.  Discussed how to switch from Lexapro to Prozac by tapering off of Lexapro completely before starting Prozac.  Patient verbalized understanding, instructions also written in AVS.  To give him time to taper off of Lexapro as well as the full 6 weeks to assess Prozac efficacy, follow-up in about 8 weeks.

## 2023-04-20 NOTE — Assessment & Plan Note (Signed)
Saw [facility] 04/19/2023. MRI and CT ordered for 04/21/2023 and will follow up with them afterward.

## 2023-04-20 NOTE — Patient Instructions (Addendum)
SWITCHING LEXAPRO TO PROZAC: -Alternate LEXAPRO full tablet and half tablet every other day for 5 days. If tolerating well, continue to next step. If not, stay at current regimen additional 2-3 days. -LEXAPRO half tablet every day for 5 days. If tolerating well, continue to next step. If not, stay at current regimen additional 2-3 days. -Alternate LEXAPRO half tablet and no tablet every other day for 5 days. If tolerating well, continue to next step. If not, stay at current regimen additional 2-3 days. -START PROZAC 10 mg daily.

## 2023-04-26 ENCOUNTER — Ambulatory Visit
Admission: RE | Admit: 2023-04-26 | Discharge: 2023-04-26 | Disposition: A | Discharge status: Home / Self Care | Payer: Medicaid Other | Source: Ambulatory Visit | Attending: Family Medicine | Admitting: Family Medicine

## 2023-04-26 ENCOUNTER — Ambulatory Visit: Admission: RE | Admit: 2023-04-26 | Payer: Medicaid Other | Source: Ambulatory Visit

## 2023-04-26 DIAGNOSIS — Z122 Encounter for screening for malignant neoplasm of respiratory organs: Secondary | ICD-10-CM

## 2023-04-26 DIAGNOSIS — R1032 Left lower quadrant pain: Secondary | ICD-10-CM

## 2023-04-26 MED ORDER — IOPAMIDOL (ISOVUE-300) INJECTION 61%
100.0000 mL | Freq: Once | INTRAVENOUS | Status: AC | PRN
  Administered 2023-04-26: 100 mL via INTRAVENOUS

## 2023-04-30 ENCOUNTER — Other Ambulatory Visit: Payer: Self-pay | Admitting: Family Medicine

## 2023-04-30 DIAGNOSIS — R161 Splenomegaly, not elsewhere classified: Secondary | ICD-10-CM

## 2023-04-30 DIAGNOSIS — K8020 Calculus of gallbladder without cholecystitis without obstruction: Secondary | ICD-10-CM

## 2023-04-30 DIAGNOSIS — R1032 Left lower quadrant pain: Secondary | ICD-10-CM

## 2023-05-06 ENCOUNTER — Encounter: Payer: Self-pay | Admitting: Gastroenterology

## 2023-05-24 ENCOUNTER — Telehealth: Payer: Self-pay

## 2023-05-24 NOTE — Telephone Encounter (Signed)
Pt has called a few times in regards to finding a medication to help him with weight loss goals. Pt has an appt on 06/30/23. Pt has called his insurance to see what is covered and was told that Zepbound, Reginal Lutes and Greggory Keen were all possibilities that are now covered by insurance. Pt is have trouble with his sleep apnea and refuses to wear the mask with CPAP as he feeling like Claustrophobic.

## 2023-05-24 NOTE — Telephone Encounter (Signed)
We can definitely talk through those options at his upcoming appointment to figure out which one is going to be the best fit for him!  If he is not able to wear CPAP for his sleep apnea, I would be happy to send a referral for him to talk to the sleep disorder specialists about any alternatives.

## 2023-06-15 ENCOUNTER — Encounter: Payer: Self-pay | Admitting: Family Medicine

## 2023-06-15 ENCOUNTER — Ambulatory Visit (INDEPENDENT_AMBULATORY_CARE_PROVIDER_SITE_OTHER): Payer: Medicaid Other | Admitting: Family Medicine

## 2023-06-15 VITALS — BP 138/82 | HR 79 | Ht 77.0 in | Wt 345.8 lb

## 2023-06-15 DIAGNOSIS — L97522 Non-pressure chronic ulcer of other part of left foot with fat layer exposed: Secondary | ICD-10-CM | POA: Diagnosis not present

## 2023-06-15 DIAGNOSIS — L97512 Non-pressure chronic ulcer of other part of right foot with fat layer exposed: Secondary | ICD-10-CM | POA: Diagnosis not present

## 2023-06-15 NOTE — Assessment & Plan Note (Signed)
Recurrent problem for patient.  Due to peripheral vascular disease and worsened by smoking.  No diabetes. - Continue antibiotic ointment and Epsom salt baths - Referral to podiatry - Consider referral to vascular surgery - Encourage smoking cessation.

## 2023-06-15 NOTE — Patient Instructions (Addendum)
It was nice to see you today,  We addressed the following topics today: -I have sent in a referral to the podiatrist.  While you are waiting to see them I would continue using the antibacterial ointment and soaking them with Epsom salt or soapy water. - If you start to notice any signs of infection like purulence or increased swelling, pain, redness let us know we can prescribe antibiotics - I will look into any other smoking resources and let you know if I find anything that we have tried yet. -I will also look into seeing if it is appropriate to send you back to another vascular specialist regarding peripheral artery disease.  Have a great day,  Frederic Jericho, MD

## 2023-06-15 NOTE — Progress Notes (Unsigned)
   Acute Office Visit  Subjective:     Patient ID: Eduardo [name], male    DOB: 1971-06-20, 52 y.o.   MRN: [id number]  Chief Complaint  Patient presents with   Foot Swelling    HPI Patient is in today for wounds on feet - hx of stasis dermatitis  Patient states that he periodically gets wounds on his feet.  He puts antibiotic cream on them and soaks in Epsom salt and they eventually resolve.  He has had multiple surgeries on a broken right toe in the past.  He has a history of peripheral artery disease and saw a vascular surgeon in the past but does not believe he ever had a angiography.  Currently no symptoms or signs of infection.  Comes in today because the wound is in the webbing of his toe between first and second toe.  Has never seen a podiatrist.  Continues to smoke.  Has tried quitting in the past with Chantix, Wellbutrin, nicotine patches.  Has not been successful other than when he was in a skilled rehab facility for 3 weeks and not allowed to smoke.   ROS      Objective:    BP 138/82   Pulse 79   Ht 6\' 5"  (1.956 m)   Wt (!) 345 lb 12.8 oz (156.9 kg)   SpO2 97%   BMI 41.01 kg/m  {Vitals History (Optional):23777}  Physical Exam General: Alert, oriented Pulmonary: Stertorous breath sounds Extremities: Ulcerations on the plantar aspect of the first MTP joints bilaterally.  Ulceration on the right toe between the first and second digit.  No pus or drainage.  No evidence of infection.  DP pulses present bilaterally.    No results found for any visits on 06/15/23.      Assessment & Plan:   Ulcer of both feet with fat layer exposed [facility]) Assessment & Plan: Recurrent problem for patient.  Due to peripheral vascular disease and worsened by smoking.  No diabetes. - Continue antibiotic ointment and Epsom salt baths - Referral to podiatry - Consider referral to vascular surgery - Encourage smoking cessation.  Orders: -     Ambulatory referral to  Podiatry     Return if symptoms worsen or fail to improve.  Sandre Kitty, MD

## 2023-06-17 ENCOUNTER — Ambulatory Visit (INDEPENDENT_AMBULATORY_CARE_PROVIDER_SITE_OTHER): Payer: Medicaid Other | Admitting: Podiatry

## 2023-06-17 DIAGNOSIS — L97522 Non-pressure chronic ulcer of other part of left foot with fat layer exposed: Secondary | ICD-10-CM

## 2023-06-17 DIAGNOSIS — I739 Peripheral vascular disease, unspecified: Secondary | ICD-10-CM

## 2023-06-17 DIAGNOSIS — L97512 Non-pressure chronic ulcer of other part of right foot with fat layer exposed: Secondary | ICD-10-CM | POA: Diagnosis not present

## 2023-06-17 MED ORDER — DOXYCYCLINE HYCLATE 100 MG PO TABS: 100.0000 mg | ORAL_TABLET | Freq: Two times a day (BID) | ORAL | 0 refills | Status: AC

## 2023-06-17 MED ORDER — MUPIROCIN 2 % EX OINT: 1.0000 | TOPICAL_OINTMENT | Freq: Every day | CUTANEOUS | 3 refills | Status: DC

## 2023-06-17 NOTE — Progress Notes (Signed)
Subjective:  Patient ID: Eduardo [name], male    DOB: 1971/09/07,  MRN: [id number]  Chief Complaint  Patient presents with   Wound Check    Ulcers to bilateral feet- ball of foot. Patient has been treating areas himself. He has been soaking in epsom salt, applying A&D ointment. Not diabetic. He has had this ulcers in the past and they would heal and open again.     52 y.o. male presents with concern for ulcerations on the bilateral plantar forefoot.  Patient has been treating the area with Epsom salt soaks apply antibiotic ointment.  He denies a history of diabetes.  Does have a history of smoking as well as concern for peripheral vascular disease.  She has previously had vascular testing done but this was a while ago per chart review was in 2021.  He says the wounds occasionally heal up and then come back.  He does note that he has some numbness in his feet in the forefoot area.  Not sure what is causing that.  Past Medical History:  Diagnosis Date   Anxiety    Chronic back pain    Degenerative disk disease    Depression    DVT of upper extremity (deep vein thrombosis) (HCC)    GERD (gastroesophageal reflux disease)    Hypertension    MRSA (methicillin resistant staph aureus) culture positive    PAD (peripheral artery disease) (HCC)    Peripheral neuropathy    Pilonidal cyst    Pruritus 12/23/2021   Pulmonary embolus (HCC)    no blood thinner 2002   Sleep apnea    can't use cpap due to deviated nasal septumg    Allergies  Allergen Reactions   Pollen Extract Cough   Dust Mite Extract Other (See Comments)   Other Other (See Comments)    Ragweed     ROS: Negative except as per HPI above  Objective:  General: AAO x3, NAD  Dermatological: Ulceration present bilateral plantar forefoot.  On the left foot wound probes to subcutaneous fat tissue.  Measures approximately 1.5 x 2.5 x 0.3 cm postdebridement.  Centered at the plantar aspect of the first and second metatarsal head  area.  On the right foot there is scattered wounds plantar first and second metatarsal head measuring 2 x 1 x 0.3 postdebridement.  Healthy granular tissue base.  Probes subcutaneous fat tissue  Vascular: Diminished pedal pulses bilaterally  Neruologic: Grossly diminished via light touch bilateral protective sensation is absent to the forefoot  Musculoskeletal: No gross boney pedal deformities bilateral. No pain, crepitus, or limitation noted with foot and ankle range of motion bilateral. Muscular strength 5/5 in all groups tested bilateral.  Gait: Unassisted, Nonantalgic.   No images are attached to the encounter.   Assessment:   1. Ulcer of right foot with fat layer exposed (HCC)   2. Ulcer of left foot with fat layer exposed (HCC)   3. PVD (peripheral vascular disease) (HCC)      Plan:  Patient was evaluated and treated and all questions answered.  Ulcer bilateral plantar forefoot -We discussed the etiology and factors that are a part of the wound healing process.  We also discussed the risk of infection both soft tissue and osteomyelitis from open ulceration.  Discussed the risk of limb loss if this happens or worsens. -Debridement as below. -Dressed with antibiotic ointment, DSD. -Continue home dressing changes daily with mupirocin ointment and gauze wrap -Continue off-loading with surgical shoe.  Dispensed bilateral surgical  shoe to the patient at this visit -Vascular testing ordered given history of PVD as well as diminished pedal pulses -HgbA1c: Within normal limits -Last antibiotics: eRx for doxycycline 100 mg twice daily for 10 days -Imaging: Deferred due to superficial nature of the wounds -[facility] wound care center was placed at this visit.  Procedure: Excisional Debridement of Wound Rationale: Removal of non-viable soft tissue from the wound to promote healing.  Anesthesia: none Post-Debridement Wound Measurements: 2.5 cm x 1.5 cm x 0.3 cm LEFT foot. Right foot 2x1x0.3  cm.  Type of Debridement: Sharp Excisional Tissue Removed: Non-viable soft tissue Depth of Debridement: subcutaneous tissue. Technique: Sharp excisional debridement to bleeding, viable wound base.  Dressing: Dry, sterile, compression dressing. Disposition: Patient tolerated procedure well.     Return in about 3 weeks (around 07/08/2023) for f/u bilateral foot ulcers.          Corinna Gab, DPM [facility] / [facility]

## 2023-06-18 ENCOUNTER — Encounter: Payer: Self-pay | Admitting: Podiatry

## 2023-06-22 ENCOUNTER — Ambulatory Visit (HOSPITAL_BASED_OUTPATIENT_CLINIC_OR_DEPARTMENT_OTHER): Payer: Medicaid Other | Admitting: General Surgery

## 2023-06-22 ENCOUNTER — Other Ambulatory Visit: Payer: Self-pay | Admitting: *Deleted

## 2023-06-22 DIAGNOSIS — L97522 Non-pressure chronic ulcer of other part of left foot with fat layer exposed: Secondary | ICD-10-CM

## 2023-06-23 ENCOUNTER — Encounter: Payer: Self-pay | Admitting: Podiatry

## 2023-06-24 ENCOUNTER — Ambulatory Visit (HOSPITAL_COMMUNITY)
Admission: RE | Admit: 2023-06-24 | Discharge: 2023-06-24 | Disposition: A | Discharge status: Home / Self Care | Payer: Medicaid Other | Source: Ambulatory Visit | Attending: Vascular Surgery | Admitting: Vascular Surgery

## 2023-06-24 DIAGNOSIS — L97522 Non-pressure chronic ulcer of other part of left foot with fat layer exposed: Secondary | ICD-10-CM | POA: Diagnosis present

## 2023-06-24 DIAGNOSIS — L97512 Non-pressure chronic ulcer of other part of right foot with fat layer exposed: Secondary | ICD-10-CM | POA: Diagnosis present

## 2023-06-24 LAB — VAS US ABI WITH/WO TBI
Left ABI: 1.18
Right ABI: 1.11

## 2023-06-30 ENCOUNTER — Ambulatory Visit (INDEPENDENT_AMBULATORY_CARE_PROVIDER_SITE_OTHER): Payer: Medicaid Other | Admitting: Family Medicine

## 2023-06-30 ENCOUNTER — Encounter: Payer: Self-pay | Admitting: Family Medicine

## 2023-06-30 VITALS — BP 121/82 | HR 83 | Resp 18 | Ht 77.0 in | Wt 341.0 lb

## 2023-06-30 DIAGNOSIS — L97512 Non-pressure chronic ulcer of other part of right foot with fat layer exposed: Secondary | ICD-10-CM | POA: Diagnosis not present

## 2023-06-30 DIAGNOSIS — F321 Major depressive disorder, single episode, moderate: Secondary | ICD-10-CM

## 2023-06-30 DIAGNOSIS — G4733 Obstructive sleep apnea (adult) (pediatric): Secondary | ICD-10-CM

## 2023-06-30 DIAGNOSIS — E781 Pure hyperglyceridemia: Secondary | ICD-10-CM

## 2023-06-30 DIAGNOSIS — F5104 Psychophysiologic insomnia: Secondary | ICD-10-CM | POA: Diagnosis not present

## 2023-06-30 DIAGNOSIS — F411 Generalized anxiety disorder: Secondary | ICD-10-CM | POA: Diagnosis not present

## 2023-06-30 DIAGNOSIS — L97522 Non-pressure chronic ulcer of other part of left foot with fat layer exposed: Secondary | ICD-10-CM

## 2023-06-30 DIAGNOSIS — I10 Essential (primary) hypertension: Secondary | ICD-10-CM

## 2023-06-30 MED ORDER — ZEPBOUND 2.5 MG/0.5ML ~~LOC~~ SOAJ
2.5000 mg | SUBCUTANEOUS | 2 refills | Status: AC
Start: 2023-06-30 — End: ?

## 2023-06-30 MED ORDER — DESVENLAFAXINE SUCCINATE ER 50 MG PO TB24: 50.0000 mg | ORAL_TABLET | Freq: Every day | ORAL | 3 refills | Status: DC

## 2023-06-30 NOTE — Assessment & Plan Note (Signed)
PHQ-9 score 15, GAD-7 score 15.  Both Lexapro and Prozac have been completely ineffective, patient states that it was as if he was not taking anything.  In the past, has also failed Zoloft and Cymbalta.  Initiate trial of desvenlafaxine 50 mg daily.  We discussed tapering versus direct switch, patient states he is most likely to do direct switch.  If desvenlafaxine also proves ineffective, referral to psychiatry for further evaluation and management may be warranted.

## 2023-06-30 NOTE — Progress Notes (Signed)
Established Patient Office Visit  Subjective   Patient ID: Eduardo [name], male    DOB: [birth year]  Age: 52 y.o. MRN: [id number]  Chief Complaint  Patient presents with   Anxiety   Depression   Foot Pain    Bilateral pain. Ulcers on feet. Scheduled w/ wound care 07/22/23.    HPI Eduardo [name] is a 52 y.o. male presenting today for follow up of sleep, mood.  He would also like to discuss weight loss options, insurance should cover injectable options like Wegovy or Zepbound. Patient would like to lose weight and cites health as reasons for wanting to lose weight.  His weight has been exacerbating the open ulcers on his feet.  He does have an upcoming appointment with wound management on 07/22/2023 and is on the waiting list to get in sooner if they have any availability. Insomnia: He struggles with sleep maintenance. He is taking Seroquel nightly, denies side effects.  This has still not been helpful, he is waking up about once every hour.  Every time he gets up he does need to pee.  He has an upcoming appointment with urology for further evaluation, most recent PSA within normal limits at 0.3 on 01/20/2023. Mood: Patient is here to follow up for depression and anxiety, currently managing with Prozac. Taking medication without side effects, reports excellent compliance with treatment. Denies mood changes or SI/HI. He feels mood is worse since last visit.  The Prozac has not had any effect, he states that it is as if he is not taking any medication at all.     06/30/2023   10:28 AM 06/15/2023   10:02 AM 04/20/2023    9:49 AM  Depression screen PHQ 2/9  Decreased Interest 3 3 2   Down, Depressed, Hopeless 1 0 0  PHQ - 2 Score 4 3 2   Altered sleeping 3 3 3   Tired, decreased energy 3 3 3   Change in appetite 1 1 1   Feeling bad or failure about yourself  0 0 0  Trouble concentrating 3 3 2   Moving slowly or fidgety/restless 1 2 0  Suicidal thoughts 0 0 0  PHQ-9 Score 15 15 11   Difficult  doing work/chores Very difficult Very difficult Very difficult       06/30/2023   10:28 AM 04/20/2023    9:49 AM 03/23/2023    9:22 AM 01/20/2023    1:41 PM  GAD 7 : Generalized Anxiety Score  Nervous, Anxious, on Edge 3 2 3 1   Control/stop worrying 2 2 3 3   Worry too much - different things 2 2 2 3   Trouble relaxing 3 3 3 3   Restless 2 2 2 3   Easily annoyed or irritable 3 1 2 1   Afraid - awful might happen 0 0 1 0  Total GAD 7 Score 15 12 16 14   Anxiety Difficulty Very difficult Very difficult Extremely difficult Very difficult   ROS Negative unless otherwise noted in HPI   Objective:     BP 121/82   Pulse 83   Resp 18   Ht 6\' 5"  (1.956 m)   Wt (!) 341 lb (154.7 kg)   SpO2 96%   BMI 40.44 kg/m   Physical Exam Constitutional:      General: He is not in acute distress.    Appearance: Normal appearance.  HENT:     Head: Normocephalic and atraumatic.  Cardiovascular:     Rate and Rhythm: Normal rate and regular rhythm.  Heart sounds: Normal heart sounds. No murmur heard.    No friction rub. No gallop.  Pulmonary:     Effort: Pulmonary effort is normal. No respiratory distress.     Breath sounds: Normal breath sounds. No wheezing, rhonchi or rales.  Feet:     Right foot:     Skin integrity: Ulcer present.     Left foot:     Skin integrity: Ulcer present.     Comments: Ulcer between first and second digits on right foot has started healing and is nearly completely closed Skin:    General: Skin is warm and dry.  Neurological:     Mental Status: He is alert and oriented to person, place, and time.  Psychiatric:        Mood and Affect: Mood normal.   Foot ulcer images from appt on 06/15/2023    Assessment & Plan:  Generalized anxiety disorder Assessment & Plan: PHQ-9 score 15, GAD-7 score 15.  Both Lexapro and Prozac have been completely ineffective, patient states that it was as if he was not taking anything.  In the past, has also failed Zoloft and Cymbalta.   Initiate trial of desvenlafaxine 50 mg daily.  We discussed tapering versus direct switch, patient states he is most likely to do direct switch.  If desvenlafaxine also proves ineffective, referral to psychiatry for further evaluation and management may be warranted.  Orders: -     Desvenlafaxine Succinate ER; Take 1 tablet (50 mg total) by mouth daily.  Dispense: 30 tablet; Refill: 3  Current moderate episode of major depressive disorder without prior episode (HCC) Assessment & Plan: PHQ-9 score 15.  Discontinue Prozac, initiate desvenlafaxine 50 mg daily.  We discussed either tapering off of Prozac or a direct switch, patient will likely do direct switch.   Morbid obesity (HCC) Assessment & Plan: Current weight 341 pounds, BMI 40.44.  Weight loss would be beneficial for patient's overall health including hypertension, OSA, hypertriglyceridemia.  Start Zepbound 2.5 mg weekly injection for 1 month.  At that time, may continue at lowest dose or start titrating up.  Discussed mechanism of action and potential side effects, patient verbalized understanding and is agreeable with this plan.  Orders: -     Zepbound; Inject 2.5 mg into the skin once a week.  Dispense: 2 mL; Refill: 2  Ulcer of both feet with fat layer exposed (HCC) Assessment & Plan: Continue antibiotic ointment and Epsom salt baths until upcoming appointment with wound management.  After that appointment, we may discuss a referral to vascular surgery and will further discussed the importance of smoking cessation.   Psychophysiological insomnia Assessment & Plan: Still not sleeping well.  Continue quetiapine 25 mg daily at bedtime for sleep and anxiety.  He also has an upcoming appointment with neurology for ongoing nocturia.  Will continue to monitor.  May consider making medication changes once nocturia has been fully evaluated.   Primary hypertension -     Zepbound; Inject 2.5 mg into the skin once a week.  Dispense: 2 mL;  Refill: 2  OSA on CPAP -     Zepbound; Inject 2.5 mg into the skin once a week.  Dispense: 2 mL; Refill: 2  Hypertriglyceridemia -     Zepbound; Inject 2.5 mg into the skin once a week.  Dispense: 2 mL; Refill: 2    Return in about 6 weeks (around 08/11/2023) for follow-up for mood, weight management.    Melida Quitter, PA

## 2023-06-30 NOTE — Assessment & Plan Note (Addendum)
Still not sleeping well.  Continue quetiapine 25 mg daily at bedtime for sleep and anxiety.  He also has an upcoming appointment with neurology for ongoing nocturia.  Will continue to monitor.  May consider making medication changes once nocturia has been fully evaluated.

## 2023-06-30 NOTE — Assessment & Plan Note (Addendum)
Current weight 341 pounds, BMI 40.44.  Weight loss would be beneficial for patient's overall health including hypertension, OSA, hypertriglyceridemia.  Start Zepbound 2.5 mg weekly injection for 1 month.  At that time, may continue at lowest dose or start titrating up.  Discussed mechanism of action and potential side effects, patient verbalized understanding and is agreeable with this plan.

## 2023-06-30 NOTE — Assessment & Plan Note (Signed)
Continue antibiotic ointment and Epsom salt baths until upcoming appointment with wound management.  After that appointment, we may discuss a referral to vascular surgery and will further discussed the importance of smoking cessation.

## 2023-06-30 NOTE — Assessment & Plan Note (Signed)
PHQ-9 score 15.  Discontinue Prozac, initiate desvenlafaxine 50 mg daily.  We discussed either tapering off of Prozac or a direct switch, patient will likely do direct switch.

## 2023-07-08 ENCOUNTER — Encounter: Payer: Self-pay | Admitting: Podiatry

## 2023-07-08 ENCOUNTER — Ambulatory Visit (INDEPENDENT_AMBULATORY_CARE_PROVIDER_SITE_OTHER): Payer: Medicaid Other | Admitting: Podiatry

## 2023-07-08 DIAGNOSIS — I739 Peripheral vascular disease, unspecified: Secondary | ICD-10-CM

## 2023-07-08 DIAGNOSIS — L97512 Non-pressure chronic ulcer of other part of right foot with fat layer exposed: Secondary | ICD-10-CM

## 2023-07-08 DIAGNOSIS — L97522 Non-pressure chronic ulcer of other part of left foot with fat layer exposed: Secondary | ICD-10-CM

## 2023-07-08 NOTE — Progress Notes (Signed)
Subjective:  Patient ID: CEDELL COWFER, male    DOB: 03-13-1971,  MRN: [id number]  Chief Complaint  Patient presents with   Wound Check    f/u bilateral foot ulcers. Patient stated he had the vascular test done but he was unable to go to the wound center. He finished the course of antibiotics.     52 y.o. male presents For follow-up bilateral foot ulcers.  Patient states he had vascular testing done as previously ordered.  He has finished course of antibiotics.  Reports that the ulcers seem to be doing little bit better especially the left foot.  Past Medical History:  Diagnosis Date   Anxiety    Chronic back pain    Degenerative disk disease    Depression    DVT of upper extremity (deep vein thrombosis) (HCC)    GERD (gastroesophageal reflux disease)    Hypertension    MRSA (methicillin resistant staph aureus) culture positive    PAD (peripheral artery disease) (HCC)    Peripheral neuropathy    Pilonidal cyst    Pruritus 12/23/2021   Pulmonary embolus (HCC)    no blood thinner 2002   Sleep apnea    can't use cpap due to deviated nasal septumg    Allergies  Allergen Reactions   Pollen Extract Cough   Dust Mite Extract Other (See Comments)   Other Other (See Comments)    Ragweed     ROS: Negative except as per HPI above  Objective:  General: AAO x3, NAD  Dermatological: Ulceration present bilateral plantar forefoot.  On the left foot wound probes to subcutaneous fat tissue.  Measures approximately 1 x 1 x 0.3 cm postdebridement much improved from prior.  Centered at the plantar aspect of the first and second metatarsal head area.  On the right foot there is scattered wounds plantar first and second metatarsal head measuring 1.5 x 1.5 x 0.3 postdebridement.  Healthy granular tissue base.  Probes subcutaneous fat tissue overall much improved from prior no evidence of infection  Vascular: Diminished pedal pulses bilaterally  Neruologic: Grossly diminished via light  touch bilateral protective sensation is absent to the forefoot  Musculoskeletal: No gross boney pedal deformities bilateral. No pain, crepitus, or limitation noted with foot and ankle range of motion bilateral. Muscular strength 5/5 in all groups tested bilateral.  Gait: Unassisted, Nonantalgic.   No images are attached to the encounter.   Assessment:   1. Ulcer of right foot with fat layer exposed (HCC)   2. Ulcer of left foot with fat layer exposed (HCC)   3. PVD (peripheral vascular disease) (HCC)       Plan:  Patient was evaluated and treated and all questions answered.  Ulcer bilateral plantar forefoot -Improving with wound care including mupirocin ointment dressing changes daily and prior course antibiotics -We discussed the etiology and factors that are a part of the wound healing process.  We also discussed the risk of infection both soft tissue and osteomyelitis from open ulceration.  Discussed the risk of limb loss if this happens or worsens. -Debridement as below. -Dressed with antibiotic ointment, DSD. -Continue home dressing changes daily with mupirocin ointment and gauze wrap -Continue off-loading with surgical shoe.   -Vascular testing completed and showed normal ABI PVR study unchanged from prior study in 2021 -HgbA1c: Within normal limits -Last antibiotics: Previously a course of doxycycline will defer further antibiotics is no evidence of infection at this time -Imaging: Deferred due to superficial nature of the wounds -  wound care center referral completed and patient will see them in October 1  Procedure: Excisional Debridement of Wound Rationale: Removal of non-viable soft tissue from the wound to promote healing.  Anesthesia: none Post-Debridement Wound Measurements: 1 cm x 1 cm x 0.3 cm LEFT foot. Right foot 1.5 x 1.5 x0.3 cm.  Type of Debridement: Sharp Excisional Tissue Removed: Non-viable soft tissue Depth of Debridement: subcutaneous  tissue. Technique: Sharp excisional debridement to bleeding, viable wound base.  Dressing: Dry, sterile, compression dressing. Disposition: Patient tolerated procedure well.     Return in about 3 weeks (around 07/29/2023) for f/u bilateral foot uclers.          Corinna Gab, DPM [facility] / [facility]

## 2023-07-12 ENCOUNTER — Ambulatory Visit: Payer: Medicaid Other | Admitting: Family Medicine

## 2023-07-20 ENCOUNTER — Other Ambulatory Visit: Payer: Self-pay | Admitting: Family Medicine

## 2023-07-20 DIAGNOSIS — F5104 Psychophysiologic insomnia: Secondary | ICD-10-CM

## 2023-07-22 ENCOUNTER — Encounter: Payer: Self-pay | Admitting: Gastroenterology

## 2023-07-22 ENCOUNTER — Encounter (HOSPITAL_BASED_OUTPATIENT_CLINIC_OR_DEPARTMENT_OTHER): Payer: Medicaid Other | Attending: General Surgery | Admitting: General Surgery

## 2023-07-22 ENCOUNTER — Ambulatory Visit: Payer: Medicaid Other | Admitting: Gastroenterology

## 2023-07-22 VITALS — BP 140/78 | HR 84 | Ht 77.0 in | Wt 339.8 lb

## 2023-07-22 DIAGNOSIS — I890 Lymphedema, not elsewhere classified: Secondary | ICD-10-CM | POA: Insufficient documentation

## 2023-07-22 DIAGNOSIS — G9089 Other disorders of autonomic nervous system: Secondary | ICD-10-CM | POA: Insufficient documentation

## 2023-07-22 DIAGNOSIS — G629 Polyneuropathy, unspecified: Secondary | ICD-10-CM | POA: Insufficient documentation

## 2023-07-22 DIAGNOSIS — M1990 Unspecified osteoarthritis, unspecified site: Secondary | ICD-10-CM | POA: Diagnosis not present

## 2023-07-22 DIAGNOSIS — I739 Peripheral vascular disease, unspecified: Secondary | ICD-10-CM | POA: Insufficient documentation

## 2023-07-22 DIAGNOSIS — Z86718 Personal history of other venous thrombosis and embolism: Secondary | ICD-10-CM | POA: Diagnosis not present

## 2023-07-22 DIAGNOSIS — G8929 Other chronic pain: Secondary | ICD-10-CM | POA: Insufficient documentation

## 2023-07-22 DIAGNOSIS — L97512 Non-pressure chronic ulcer of other part of right foot with fat layer exposed: Secondary | ICD-10-CM | POA: Insufficient documentation

## 2023-07-22 DIAGNOSIS — R1032 Left lower quadrant pain: Secondary | ICD-10-CM

## 2023-07-22 DIAGNOSIS — G9009 Other idiopathic peripheral autonomic neuropathy: Secondary | ICD-10-CM | POA: Insufficient documentation

## 2023-07-22 DIAGNOSIS — L97522 Non-pressure chronic ulcer of other part of left foot with fat layer exposed: Secondary | ICD-10-CM | POA: Insufficient documentation

## 2023-07-22 DIAGNOSIS — I10 Essential (primary) hypertension: Secondary | ICD-10-CM | POA: Insufficient documentation

## 2023-07-22 DIAGNOSIS — Z86711 Personal history of pulmonary embolism: Secondary | ICD-10-CM | POA: Insufficient documentation

## 2023-07-22 NOTE — Progress Notes (Signed)
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 does not feel he could walk safely in forefoot offloading shoes due to his neuropathy. He was advised to decrease his nicotine exposure and stay off his feet is much as possible. Questions about his diet suggest that he is getting adequate protein intake on his own. Follow-up in 1 week. Electronic Signature(s) Signed: 07/22/2023 1:47:05 PM By: Duanne Guess MD FACS Entered By: Duanne Guess on 07/22/2023 10:47:05 -------------------------------------------------------------------------------- HxROS Details Patient Name: Date of Service: Eduardo [name] 07/22/2023 12:45 PM Medical Record Number: [id number]  Patient Account Number: [id number] Date of Birth/Sex: Treating RN: 16-Oct-1971 (52 y.o. [name] Primary Care Provider: Saralyn Pilar Other Clinician: Referring Provider: Treating Provider/Extender: Lina Sar in Treatment: 0 Information Obtained From Patient Chart Eyes Complaints and Symptoms: Negative for: Dry Eyes; Vision Changes; Glasses / Contacts Ear/Nose/Mouth/Throat Complaints and Symptoms: Negative for: Chronic sinus problems or rhinitis Endocrine SIMONE, TUCKEY ([id number]) [id number] Page 9 of 11 Complaints and Symptoms: Negative for: Heat/cold intolerance Medical History: Negative for: Type I Diabetes; Type II Diabetes Genitourinary Complaints and Symptoms: Negative for: Frequent urination Integumentary (Skin) Complaints and Symptoms: Positive for: Wounds Medical History: Past Medical History Notes: Pruritus Pilonidal cyst stasis dermatitis Constitutional Symptoms (General Health) Medical History: Past Medical History Notes: MRSA Hematologic/Lymphatic Medical History: Positive for: Lymphedema Respiratory Medical History: Positive for: Chronic Obstructive Pulmonary Disease (COPD); Sleep Apnea Past Medical History Notes: Pulmonary embolus Cardiovascular Medical History: Positive for: Deep Vein Thrombosis; Peripheral Arterial Disease Negative for: Hypertension Gastrointestinal Medical History: Past Medical History Notes: GERD (gastroesophageal reflux disease) Immunological Musculoskeletal Medical History: Positive for: Osteoarthritis Past Medical History Notes: Chronic back pain Degenerative disk disease Neurologic Medical  History: Positive for: Neuropathy Oncologic Psychiatric Medical History: Past Medical History Notes: Anxiety Depression opiate dependence benzodiazepine dependence Immunizations JARIN, CORNFIELD ([id number]) [id number] Page 10 of 11 Pneumococcal Vaccine: Received Pneumococcal Vaccination: No Implantable Devices None Hospitalization / Surgery History Type of Hospitalization/Surgery Mass excision (Right) T arthroplasty (Right) oe Nasal septoplasty w/ turbinoplasty Sinus endo with fusion Wrist fusion Thoracic outlet surgery Irrigation and debridement of back abscess Family and Social History Cancer: Yes - Siblings; Diabetes: Yes - Mother; Heart Disease: Yes - Father; Hereditary Spherocytosis: No; Hypertension: Yes - Father; Kidney Disease: No; Lung Disease: No; Seizures: No; Stroke: No; Thyroid Problems: Yes - Mother; Tuberculosis: No; Current every day smoker - 1 ppd; Marital Status - Divorced; Alcohol Use: Rarely; Drug Use: Prior History; Caffeine Use: Daily; Financial Concerns: No; Food, Clothing or Shelter Needs: No; Support System Lacking: No; Transportation Concerns: No Electronic Signature(s) Signed: 07/22/2023 1:48:04 PM By: Duanne Guess MD FACS Signed: 07/22/2023 3:36:57 PM By: Gelene Mink By: Samuella Bruin on 07/22/2023 09:45:55 -------------------------------------------------------------------------------- SuperBill Details Patient Name: Date of Service: Eduardo [name] 07/22/2023 Medical Record Number: [id number] Patient Account Number: [id number] Date of Birth/Sex: Treating RN: 1971/05/20 (52 y.o. 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 G90.09 Other idiopathic peripheral autonomic neuropathy Z72.0 Tobacco use E66.01 Morbid (severe) obesity due to excess calories Facility Procedures : CPT4 Code: 41324401 Description: 99213 - WOUND CARE VISIT-LEV 3 EST PT Modifier: 25 Quantity: 1 : CPT4 Code: [id number] Description: 97597 - DEBRIDE WOUND 1ST 20 SQ CM OR < ICD-10 Diagnosis Description L97.512 Non-pressure chronic ulcer of other part of right foot with fat layer exposed L97.522 Non-pressure chronic ulcer of other part of left foot with fat layer exposed Modifier: Quantity: 1 Physician Procedures : CPT4 Code Description Modifier [id number] 99204 - WC PHYS LEVEL 4 - NEW PT 25 ICD-10 Diagnosis Description L97.512 Non-pressure chronic ulcer of other part of right foot with fat layer exposed L97.522 Non-pressure chronic ulcer of other part of left foot  with fat layer exposed G90.09 Other idiopathic peripheral autonomic neuropathy Z72.0 Tobacco  use DEHAVEN, SINE ([id number]) [phone] Quantity: 1 227.pdf Page 11 of 11 : [id number] 97597 - WC PHYS DEBR WO ANESTH 20 SQ CM ICD-10 Diagnosis Description L97.512 Non-pressure chronic ulcer of other part of right foot with fat layer exposed L97.522 Non-pressure chronic ulcer of other part of left foot with fat layer exposed Quantity: 1 Electronic Signature(s) Signed: 07/22/2023 3:18:17 PM By: Duanne Guess MD FACS Signed: 07/22/2023 3:36:57 PM By: Samuella Bruin Previous Signature: 07/22/2023 1:47:24 PM Version By: Duanne Guess MD FACS Entered By: Samuella Bruin on 07/22/2023 10:55:45

## 2023-07-22 NOTE — Progress Notes (Signed)
FINIS, HENDRICKSEN ([id number]) [id number] Page 1 of 10 Visit Report for 07/22/2023 Allergy List Details Patient Name: Date of Service: Eduardo [name] 07/22/2023 12:45 PM Medical Record Number: [id number] Patient Account Number: [id number] Date of Birth/Sex: Treating RN: Feb 26, 1971 (52 y.o. Eduardo [name] Primary Care Ala Kratz: Saralyn Pilar Other Clinician: Referring Juanda Luba: Treating Osie Amparo/Extender: Lina Sar in Treatment: 0 Allergies Active Allergies pollen extracts house dust mite Ragweed Allergy Notes Electronic Signature(s) Signed: 07/22/2023 3:36:57 PM By: Samuella Bruin Entered By: Samuella Bruin on 07/21/2023 06:06:13 -------------------------------------------------------------------------------- Arrival Information Details Patient Name: Date of Service: Eduardo Ruder W. 07/22/2023 12:45 PM Medical Record Number: [id number] Patient Account Number: [id number] Date of Birth/Sex: Treating RN: 08-31-1971 (52 y.o. Eduardo [name] Primary Care Tawonna Esquer: Saralyn Pilar Other Clinician: Referring Amour Cutrone: Treating Rayvin Abid/Extender: Lina Sar in Treatment: 0 Visit Information Patient Arrived: Ambulatory Arrival Time: 12:42 Accompanied By: self Transfer Assistance: None Patient Identification Verified: Yes Secondary Verification Process Completed: Yes Patient Has Alerts: Yes Patient Alerts: ABIs: R:1.11 L:1.18 9/24 TBIs: R:1.13 L:1.02 9/24 Electronic Signature(s) Signed: 07/22/2023 3:36:57 PM By: Samuella Bruin Entered By: Samuella Bruin on 07/22/2023 09:42:23 Eduardo [name] ([id number]) [id number] Page 2 of 10 -------------------------------------------------------------------------------- Clinic Level of Care Assessment Details Patient Name: Date of Service: Eduardo [name]  07/22/2023 12:45 PM Medical Record Number: [id number] Patient Account Number: [id number] Date of Birth/Sex: Treating RN: [birth year] (52 y.o. Eduardo [name] Primary Care Carleah Yablonski: Saralyn Pilar Other Clinician: Referring Sabrinia Prien: Treating Jahsir Rama/Extender: Lina Sar in Treatment: 0 Clinic Level of Care Assessment Items TOOL 1 Quantity Score X- 1 0 Use when EandM and Procedure is performed on INITIAL visit ASSESSMENTS - Nursing Assessment / Reassessment X- 1 20 General Physical Exam (combine w/ comprehensive assessment (listed just below) when performed on new pt. evals) X- 1 25 Comprehensive Assessment (HX, ROS, Risk Assessments, Wounds Hx, etc.) ASSESSMENTS - Wound and Skin Assessment / Reassessment []  - 0 Dermatologic / Skin Assessment (not related to wound area) ASSESSMENTS - Ostomy and/or Continence Assessment and Care []  - 0 Incontinence Assessment and Management []  - 0 Ostomy Care Assessment and Management (repouching, etc.) PROCESS - Coordination of Care X - Simple Patient / Family Education for ongoing care 1 15 []  - 0 Complex (extensive) Patient / Family Education for ongoing care X- 1 10 Staff obtains Chiropractor, Records, T Results / Process Orders est []  - 0 Staff telephones HHA, Nursing Homes / Clarify orders / etc []  - 0 Routine Transfer to another Facility (non-emergent condition) []  - 0 Routine Hospital Admission (non-emergent condition) X- 1 15 New Admissions / Manufacturing engineer / Ordering NPWT Apligraf, etc. , []  - 0 Emergency Hospital Admission (emergent condition) PROCESS - Special Needs []  - 0 Pediatric / Minor Patient Management []  - 0 Isolation Patient Management []  - 0 Hearing / Language / Visual special needs []  - 0 Assessment of Community assistance (transportation, D/C planning, etc.) []  - 0 Additional assistance / Altered mentation []  - 0 Support Surface(s) Assessment (bed, cushion, seat,  etc.) INTERVENTIONS - Miscellaneous []  - 0 External ear exam []  - 0 Patient Transfer (multiple staff / Nurse, adult / Similar devices) []  - 0 Simple Staple / Suture removal (25 or less) []  - 0 Complex Staple / Suture removal (26 or more) []  - 0 Hypo/Hyperglycemic Management (do not check if billed separately) []  - 0 Ankle / Brachial Index (ABI) - do not  check if billed separately Has the patient been seen at the hospital within the last three years: Yes Total Score: 85 Level Of Care: New/Established - Level 3 Electronic Signature(s) Signed: 07/22/2023 3:36:57 PM By: Darlin Priestly, [place] W3:36:57 PM By: Darcella Gasman Signed: 07/22/2023 ([id number]) [id number] Page 3 of 10 aylor Entered By: Samuella Bruin on 07/22/2023 10:21:53 -------------------------------------------------------------------------------- Encounter Discharge Information Details Patient Name: Date of Service: Eduardo [name] 07/22/2023 12:45 PM Medical Record Number: [id number] Patient Account Number: [id number] Date of Birth/Sex: Treating RN: [birth year] (52 y.o. Eduardo [name] Primary Care Tausha Milhoan: Saralyn Pilar Other Clinician: Referring Leeanne Butters: Treating Kendyl Festa/Extender: Lina Sar in Treatment: 0 Encounter Discharge Information Items Post Procedure Vitals Discharge Condition: Stable Temperature (F): 98.2 Ambulatory Status: Ambulatory Pulse (bpm): 83 Discharge Destination: Home Respiratory Rate (breaths/min): 16 Transportation: Private Auto Blood Pressure (mmHg): 148/91 Accompanied By: self Schedule Follow-up Appointment: Yes Clinical Summary of Care: Patient Declined Electronic Signature(s) Signed: 07/22/2023 3:36:57 PM By: Samuella Bruin Entered By: Samuella Bruin on 07/22/2023 10:56:24 -------------------------------------------------------------------------------- Lower Extremity Assessment  Details Patient Name: Date of Service: Eduardo Ruder W. 07/22/2023 12:45 PM Medical Record Number: [id number] Patient Account Number: [id number] Date of Birth/Sex: Treating RN: 11-27-1970 (52 y.o. Eduardo [name] Primary Care Kainat Pizana: Saralyn Pilar Other Clinician: Referring Keyden Pavlov: Treating Xavior Niazi/Extender: Lina Sar in Treatment: 0 Edema Assessment Assessed: Kyra Searles: No] Franne Forts: No] [Left: Edema] [Right: :] Calf Left: Right: Point of Measurement: From Medial Instep 48.5 cm 49.4 cm Ankle Left: Right: Point of Measurement: From Medial Instep 29.5 cm 30 cm Knee To Floor Left: Right: From Medial Instep 55.8 cm 55.8 cm Vascular Assessment Pulses: Dorsalis Pedis Palpable: [Left:Yes] [Right:Yes] Extremity colors, hair growth, and conditions: Eduardo [name], Eduardo [name] ([id number]) [Right:[id number] Page 4 of 10] Extremity Color: [Left:Hyperpigmented] [Right:Hyperpigmented] Hair Growth on Extremity: [Left:Yes] [Right:Yes] Temperature of Extremity: [Left:Warm] [Right:Warm] Capillary Refill: [Left:< 3 seconds] [Right:< 3 seconds] Dependent Rubor: [Left:No] [Right:No] Blanched when Elevated: [Left:No No] [Right:No No] Toe Nail Assessment Left: Right: Thick: Yes Yes Discolored: No No Deformed: No No Improper Length and Hygiene: No No Electronic Signature(s) Signed: 07/22/2023 3:36:57 PM By: Samuella Bruin Entered By: Samuella Bruin on 07/22/2023 09:57:05 -------------------------------------------------------------------------------- Multi Wound Chart Details Patient Name: Date of Service: Eduardo Ruder W. 07/22/2023 12:45 PM Medical Record Number: [id number] Patient Account Number: [id number] Date of Birth/Sex: Treating RN: 10-23-1970 (52 y.o. M) Primary Care Tayvien Kane: Saralyn Pilar Other Clinician: Referring Andreika Vandagriff: Treating Honest Vanleer/Extender: Lina Sar in Treatment: 0 Vital Signs Height(in): 77 Pulse(bpm): 83 Weight(lbs): 330 Blood Pressure(mmHg): 148/91 Body Mass Index(BMI): 39.1 Temperature(F): 98.2 Respiratory Rate(breaths/min): 16 [1:Photos:] [[name]:[name]] Right Metatarsal head second Left Metatarsal head first [name] Wound Location: Thermal Burn Gradually Appeared [name] Wounding Event: Neuropathic Ulcer-Non Diabetic Neuropathic Ulcer-Non Diabetic [name] Primary Etiology: Lymphedema, Chronic Obstructive Lymphedema, Chronic Obstructive [name] Comorbid History: Pulmonary Disease (COPD), Sleep Pulmonary Disease (COPD), Sleep Apnea, Deep Vein Thrombosis, Apnea, Deep Vein Thrombosis, Peripheral Arterial Disease, Peripheral Arterial Disease, Osteoarthritis, Neuropathy Osteoarthritis, Neuropathy 04/19/2023 05/20/2023 [name] Date Acquired: 0 0 [name] Weeks of Treatment: Open Open [name] Wound Status: No No [name] Wound Recurrence: 2.3x1.2x0.2 0.7x0.8x0.2 [name] Measurements L x W x D (cm) 2.168 0.44 [name] A (cm) : rea 0.434 0.088 [name] Volume (cm) : Full Thickness Without Exposed Full Thickness Without Exposed [name] Classification: Support Structures Support Structures Medium Medium [name] Exudate Amount: Serosanguineous Serosanguineous [name] Exudate Type: red, brown red, brown [name] Exudate Color: Distinct, outline attached  Distinct, outline attached [name] Wound Margin: Large (67-100%) Large (67-100%) [name] Granulation Amount: Eduardo [name] ([id number]) [id number] Page 5 of 10 Red Red [name] Granulation Quality: Small (1-33%) Small (1-33%) [name] Necrotic Amount: Fat Layer (Subcutaneous Tissue): Yes Fat Layer (Subcutaneous Tissue): Yes [name] Exposed Structures: Fascia: No Fascia: No Tendon: No Tendon: No Muscle: No Muscle: No Joint: No Joint: No Bone: No Bone: No None None [name] Epithelialization: Debridement - Selective/Open Wound Debridement - Selective/Open Wound [name] Debridement: Pre-procedure  Verification/Time Out 13:14 13:14 [name] Taken: Lidocaine 4% T opical Solution Lidocaine 4% T opical Solution [name] Pain Control: Callus, Slough Callus, Slough [name] Tissue Debrided: Skin/Epidermis Skin/Epidermis [name] Level: 2.17 0.44 [name] Debridement A (sq cm): rea Curette Curette [name] Instrument: Minimum Minimum [name] Bleeding: Pressure Pressure [name] Hemostasis A chieved: Procedure was tolerated well Procedure was tolerated well [name] Debridement Treatment Response: 2.3x1.2x0.2 0.7x0.8x0.2 [name] Post Debridement Measurements L x W x D (cm) 0.434 0.088 [name] Post Debridement Volume: (cm) Callus: Yes Callus: Yes [name] Periwound Skin Texture: No Abnormalities Noted No Abnormalities Noted [name] Periwound Skin Moisture: No Abnormalities Noted No Abnormalities Noted [name] Periwound Skin Color: No Abnormality No Abnormality [name] Temperature: Debridement Debridement [name] Procedures Performed: Treatment Notes Electronic Signature(s) Signed: 07/22/2023 1:40:12 PM By: Duanne Guess MD FACS Entered By: Duanne Guess on 07/22/2023 10:40:12 -------------------------------------------------------------------------------- Multi-Disciplinary Care Plan Details Patient Name: Date of Service: Eduardo Ruder W. 07/22/2023 12:45 PM Medical Record Number: [id number] Patient Account Number: [id number] Date of Birth/Sex: Treating RN: January 29, 1971 (52 y.o. Eduardo [name] Primary Care Zarriah Starkel: Saralyn Pilar Other Clinician: Referring Jacqueline Spofford: Treating Hadeel Hillebrand/Extender: Lina Sar in Treatment: 0 Active Inactive Peripheral Neuropathy Nursing Diagnoses: Knowledge deficit related to disease process and management of peripheral neurovascular dysfunction Potential alteration in peripheral tissue perfusion (select prior to confirmation of diagnosis) Goals: Patient/caregiver will verbalize understanding of disease process and disease management Date Initiated:  07/22/2023 Target Resolution Date: 09/24/2023 Goal Status: Active Interventions: Assess signs and symptoms of neuropathy upon admission and as needed Provide education on Management of Neuropathy and Related Ulcers Notes: Wound/Skin Impairment Nursing Diagnoses: Impaired tissue integrity Knowledge deficit related to ulceration/compromised skin integrity Eduardo [name] ([id number]) [id number] Page 6 of 10 Goals: Patient will demonstrate a reduced rate of smoking or cessation of smoking Date Initiated: 07/22/2023 Target Resolution Date: 09/24/2023 Goal Status: Active Patient/caregiver will verbalize understanding of skin care regimen Date Initiated: 07/22/2023 Target Resolution Date: 09/24/2023 Goal Status: Active Interventions: Assess patient/caregiver ability to obtain necessary supplies Assess patient/caregiver ability to perform ulcer/skin care regimen upon admission and as needed Assess ulceration(s) every visit Provide education on smoking Treatment Activities: Referred to DME Temeca Somma for dressing supplies : 07/22/2023 Skin care regimen initiated : 07/22/2023 Topical wound management initiated : 07/22/2023 Notes: Electronic Signature(s) Signed: 07/22/2023 3:36:57 PM By: Samuella Bruin Entered By: Samuella Bruin on 07/22/2023 10:20:27 -------------------------------------------------------------------------------- Pain Assessment Details Patient Name: Date of Service: Eduardo Ruder W. 07/22/2023 12:45 PM Medical Record Number: [id number] Patient Account Number: [id number] Date of Birth/Sex: Treating RN: 15-May-1971 (52 y.o. Eduardo [name] Primary Care Adilenne Ashworth: Saralyn Pilar Other Clinician: Referring Amadeo Coke: Treating Analis Distler/Extender: Lina Sar in Treatment: 0 Active Problems Location of Pain Severity and Description of Pain Patient Has Paino Yes Site Locations Pain  Location: Generalized Pain, Pain in Ulcers Duration of the Pain. Constant / Intermittento Constant Rate the pain. Current Pain Level: 8 Character of Pain Describe the Pain: Shooting, Throbbing Pain Management and Medication Current Pain  Management: Medication: Yes Electronic Signature(s) Eduardo [name], Eduardo [name] ([id number]) [id number] Page 7 of 10 Signed: 07/22/2023 3:36:57 PM By: Gelene Mink By: Samuella Bruin on 07/22/2023 10:06:24 -------------------------------------------------------------------------------- Patient/Caregiver Education Details Patient Name: Date of Service: Eduardo [name] 10/3/2024andnbsp12:45 PM Medical Record Number: [id number] Patient Account Number: [id number] Date of Birth/Gender: Treating RN: 1971/09/05 (52 y.o. Eduardo [name] Primary Care Physician: Saralyn Pilar Other Clinician: Referring Physician: Treating Physician/Extender: Lina Sar in Treatment: 0 Education Assessment Education Provided To: Patient Education Topics Provided Wound Debridement: Methods: Explain/Verbal Responses: Reinforcements needed, State content correctly Wound/Skin Impairment: Responses: Reinforcements needed, State content correctly Electronic Signature(s) Signed: 07/22/2023 3:36:57 PM By: Samuella Bruin Entered By: Samuella Bruin on 07/22/2023 10:21:03 -------------------------------------------------------------------------------- Wound Assessment Details Patient Name: Date of Service: Eduardo Ruder W. 07/22/2023 12:45 PM Medical Record Number: [id number] Patient Account Number: [id number] Date of Birth/Sex: Treating RN: December 14, 1970 (52 y.o. Eduardo [name] Primary Care Dava Rensch: Saralyn Pilar Other Clinician: Referring Dyon Rotert: Treating Regie Bunner/Extender: Lina Sar in Treatment: 0 Wound Status Wound Number: 1  Primary Neuropathic Ulcer-Non Diabetic Etiology: Wound Location: Right Metatarsal head second Wound Open Wounding Event: Thermal Burn Status: Date Acquired: 04/19/2023 Comorbid Lymphedema, Chronic Obstructive Pulmonary Disease (COPD), Weeks Of Treatment: 0 History: Sleep Apnea, Deep Vein Thrombosis, Peripheral Arterial Disease, Clustered Wound: No Osteoarthritis, Neuropathy Photos FERNANDEZ, KENLEY ([id number]) [id number] Page 8 of 10 Wound Measurements Length: (cm) 2.3 Width: (cm) 1.2 Depth: (cm) 0.2 Area: (cm) 2.168 Volume: (cm) 0.434 % Reduction in Area: % Reduction in Volume: Epithelialization: None Tunneling: No Undermining: No Wound Description Classification: Full Thickness Without Exposed Support Structures Wound Margin: Distinct, outline attached Exudate Amount: Medium Exudate Type: Serosanguineous Exudate Color: red, brown Foul Odor After Cleansing: No Slough/Fibrino Yes Wound Bed Granulation Amount: Large (67-100%) Exposed Structure Granulation Quality: Red Fascia Exposed: No Necrotic Amount: Small (1-33%) Fat Layer (Subcutaneous Tissue) Exposed: Yes Necrotic Quality: Adherent Slough Tendon Exposed: No Muscle Exposed: No Joint Exposed: No Bone Exposed: No Periwound Skin Texture Texture Color No Abnormalities Noted: No No Abnormalities Noted: Yes Callus: Yes Temperature / Pain Temperature: No Abnormality Moisture No Abnormalities Noted: Yes Treatment Notes Wound #1 (Metatarsal head second) Wound Laterality: Right Cleanser Soap and Water Discharge Instruction: May shower and wash wound with dial antibacterial soap and water prior to dressing change. Wound Cleanser Discharge Instruction: Cleanse the wound with wound cleanser prior to applying a clean dressing using gauze sponges, not tissue or cotton balls. Peri-Wound Care Topical Primary Dressing Maxorb Extra Ag+ Alginate Dressing, 2x2 (in/in) Discharge Instruction:  Apply to wound bed as instructed Secondary Dressing Zetuvit Plus Silicone Border Dressing 4x4 (in/in) Discharge Instruction: Apply silicone border over primary dressing as directed. Secured With Compression Wrap Compression Stockings Facilities manager) Signed: 07/22/2023 3:36:57 PM By: Darlin Priestly, Ramir W3:36:57 PM By: Darcella Gasman Signed: 07/22/2023 ([id number]) [id number] Page 9 of 10 aylor Entered By: Samuella Bruin on 07/22/2023 10:08:23 -------------------------------------------------------------------------------- Wound Assessment Details Patient Name: Date of Service: Eduardo [name] 07/22/2023 12:45 PM Medical Record Number: [id number] Patient Account Number: [id number] Date of Birth/Sex: Treating RN: July 23, 1971 (52 y.o. Eduardo [name] Primary Care Jeet Shough: Saralyn Pilar Other Clinician: Referring Gentle Hoge: Treating Gustav Knueppel/Extender: Lina Sar in Treatment: 0 Wound Status Wound Number: 2 Primary Neuropathic Ulcer-Non Diabetic Etiology: Wound Location: Left Metatarsal head first Wound Open Wounding Event: Gradually Appeared Status: Date Acquired: 05/20/2023 Comorbid Lymphedema, Chronic Obstructive Pulmonary Disease (COPD), Weeks Of  Treatment: 0 History: Sleep Apnea, Deep Vein Thrombosis, Peripheral Arterial Disease, Clustered Wound: No Osteoarthritis, Neuropathy Photos Wound Measurements Length: (cm) 0.7 Width: (cm) 0.8 Depth: (cm) 0.2 Area: (cm) 0.44 Volume: (cm) 0.088 % Reduction in Area: % Reduction in Volume: Epithelialization: None Tunneling: No Undermining: No Wound Description Classification: Full Thickness Without Exposed Support Structures Wound Margin: Distinct, outline attached Exudate Amount: Medium Exudate Type: Serosanguineous Exudate Color: red, brown Foul Odor After Cleansing: No Slough/Fibrino Yes Wound Bed Granulation  Amount: Large (67-100%) Exposed Structure Granulation Quality: Red Fascia Exposed: No Necrotic Amount: Small (1-33%) Fat Layer (Subcutaneous Tissue) Exposed: Yes Necrotic Quality: Adherent Slough Tendon Exposed: No Muscle Exposed: No Joint Exposed: No Bone Exposed: No Periwound Skin Texture Texture Color No Abnormalities Noted: No No Abnormalities Noted: Yes Callus: Yes Temperature / Pain Temperature: No Abnormality Moisture No Abnormalities Noted: Yes Treatment Notes FALLOU, HULBERT ([id number]) [id number] Page 10 of 10 Wound #2 (Metatarsal head first) Wound Laterality: Left Cleanser Soap and Water Discharge Instruction: May shower and wash wound with dial antibacterial soap and water prior to dressing change. Wound Cleanser Discharge Instruction: Cleanse the wound with wound cleanser prior to applying a clean dressing using gauze sponges, not tissue or cotton balls. Peri-Wound Care Topical Primary Dressing Maxorb Extra Ag+ Alginate Dressing, 2x2 (in/in) Discharge Instruction: Apply to wound bed as instructed Secondary Dressing Zetuvit Plus Silicone Border Dressing 4x4 (in/in) Discharge Instruction: Apply silicone border over primary dressing as directed. Secured With Compression Wrap Compression Stockings Add-Ons Electronic Signature(s) Signed: 07/22/2023 3:36:57 PM By: Samuella Bruin Entered By: Samuella Bruin on 07/22/2023 10:08:42 -------------------------------------------------------------------------------- Vitals Details Patient Name: Date of Service: Eduardo Ruder W. 07/22/2023 12:45 PM Medical Record Number: [id number] Patient Account Number: [id number] Date of Birth/Sex: Treating RN: April 19, 1971 (52 y.o. Eduardo [name] Primary Care Cosmo Tetreault: Saralyn Pilar Other Clinician: Referring Brooklin Rieger: Treating Gaynor Ferreras/Extender: Lina Sar in Treatment: 0 Vital Signs Time  Taken: 12:42 Temperature (F): 98.2 Height (in): 77 Pulse (bpm): 83 Source: Stated Respiratory Rate (breaths/min): 16 Weight (lbs): 330 Blood Pressure (mmHg): 148/91 Source: Stated Reference Range: 80 - 120 mg / dl Body Mass Index (BMI): 39.1 Electronic Signature(s) Signed: 07/22/2023 3:36:57 PM By: Samuella Bruin Entered By: Samuella Bruin on 07/22/2023 09:43:01

## 2023-07-22 NOTE — Progress Notes (Signed)
EWARD, RUTIGLIANO ([id number]) [id number] Page 1 of 4 Visit Report for 07/22/2023 Abuse Risk Screen Details Patient Name: Date of Service: Eduardo [name] 07/22/2023 12:45 PM Medical Record Number: [id number] Patient Account Number: [id number] Date of Birth/Sex: Treating RN: 01/22/1971 (52 y.o. Eduardo [name] Primary Care Eduardo [name]: Saralyn Pilar Other Clinician: Referring Eduardo [name]: Treating Eduardo [name]/Extender: Lina Sar in Treatment: 0 Abuse Risk Screen Items Answer ABUSE RISK SCREEN: Has anyone close to you tried to hurt or harm you recentlyo No Do you feel uncomfortable with anyone in your familyo No Has anyone forced you do things that you didnt want to doo No Electronic Signature(s) Signed: 07/22/2023 3:36:57 PM By: Samuella Bruin Entered By: Samuella Bruin on 07/22/2023 09:46:02 -------------------------------------------------------------------------------- Activities of Daily Living Details Patient Name: Date of Service: Eduardo [name] 07/22/2023 12:45 PM Medical Record Number: [id number] Patient Account Number: [id number] Date of Birth/Sex: Treating RN: [birth year] (52 y.o. Eduardo [name] Primary Care Eduardo [name]: Saralyn Pilar Other Clinician: Referring Eduardo [name]: Treating Eduardo [name]/Extender: Lina Sar in Treatment: 0 Activities of Daily Living Items Answer Activities of Daily Living (Please select one for each item) Drive Automobile Completely Able T Medications ake Completely Able Use T elephone Completely Able Care for Appearance Completely Able Use T oilet Completely Able Bath / Shower Completely Able Dress Self Completely Able Feed Self Completely Able Walk Completely Able Get In / Out Bed Completely Able Housework Completely Able Prepare Meals Completely Able Handle Money Completely Able Shop for Self Completely  Able Electronic Signature(s) Signed: 07/22/2023 3:36:57 PM By: Samuella Bruin Entered By: Samuella Bruin on 07/22/2023 09:46:51 Eduardo [name] ([id number]) [id number] Page 2 of 4 -------------------------------------------------------------------------------- Education Screening Details Patient Name: Date of Service: Eduardo [name] 07/22/2023 12:45 PM Medical Record Number: [id number] Patient Account Number: [id number] Date of Birth/Sex: Treating RN: 01-13-1971 (52 y.o. Eduardo [name] Primary Care Eduardo [name]: Saralyn Pilar Other Clinician: Referring Eduardo [name]: Treating Eduardo [name]/Extender: Lina Sar in Treatment: 0 Primary Learner Assessed: Patient Learning Preferences/Education Level/Primary Language Learning Preference: Explanation, Demonstration, Video, Printed Material Highest Education Level: High School Preferred Language: English Cognitive Barrier Language Barrier: No Translator Needed: No Memory Deficit: No Emotional Barrier: No Cultural/Religious Beliefs Affecting Medical Care: No Physical Barrier Impaired Vision: No Impaired Hearing: No Decreased Hand dexterity: No Knowledge/Comprehension Knowledge Level: Medium Comprehension Level: Medium Ability to understand written instructions: Medium Ability to understand verbal instructions: Medium Motivation Anxiety Level: Calm Cooperation: Cooperative Education Importance: Acknowledges Need Interest in Health Problems: Asks Questions Perception: Coherent Willingness to Engage in Self-Management High Activities: Readiness to Engage in Self-Management High Activities: Electronic Signature(s) Signed: 07/22/2023 3:36:57 PM By: Samuella Bruin Entered By: Samuella Bruin on 07/22/2023 09:47:52 -------------------------------------------------------------------------------- Fall Risk Assessment Details Patient Name: Date  of Service: Eduardo Ruder W. 07/22/2023 12:45 PM Medical Record Number: [id number] Patient Account Number: [id number] Date of Birth/Sex: Treating RN: 07/03/1971 (52 y.o. Eduardo [name] Primary Care Eduardo [name]: Saralyn Pilar Other Clinician: Referring Camira Geidel: Treating Eduardo [name]/Extender: Lina Sar in Treatment: 0 Fall Risk Assessment Items Have you had 2 or more falls in the last [address] FAHD, Eduardo ([id number]) [phone] [id number] Page 3 of 4 Have you had any fall that resulted in injury in the last 12 monthso 0 No FALLS RISK SCREEN History of falling - immediate or within 3 months 0 No Secondary diagnosis (Do you have 2 or more  medical diagnoseso) 15 Yes Ambulatory aid None/bed rest/wheelchair/nurse 0 Yes Crutches/cane/walker 0 No Furniture 0 No Intravenous therapy Access/Saline/Heparin Lock 0 No Gait/Transferring Normal/ bed rest/ wheelchair 0 Yes Weak (short steps with or without shuffle, stooped but able to lift head while walking, may seek 0 No support from furniture) Impaired (short steps with shuffle, may have difficulty arising from chair, head down, impaired 0 No balance) Mental Status Oriented to own ability 0 Yes Electronic Signature(s) Signed: 07/22/2023 3:36:57 PM By: Samuella Bruin Entered By: Samuella Bruin on 07/22/2023 09:48:40 -------------------------------------------------------------------------------- Foot Assessment Details Patient Name: Date of Service: Eduardo Ruder W. 07/22/2023 12:45 PM Medical Record Number: [id number] Patient Account Number: [id number] Date of Birth/Sex: Treating RN: 09-29-1971 (52 y.o. Eduardo [name] Primary Care Square [name]: Saralyn Pilar Other Clinician: Referring Brailyn Killion: Treating Eduardo [name]/Extender: Lina Sar in Treatment: 0 Foot Assessment Items Site Locations + = Sensation  present, - = Sensation absent, C = Callus, U = Ulcer R = Redness, W = Warmth, M = Maceration, PU = Pre-ulcerative lesion F = Fissure, S = Swelling, D = Dryness Assessment Right: Left: Other Deformity: No No Prior Foot Ulcer: No No Prior Amputation: No No Charcot Joint: No No Ambulatory Status: Ambulatory Without Help GaitMEDFORD, [name] ([id number]) [phone] [id number] Page 4 of 4 Electronic Signature(s) Signed: 07/22/2023 3:36:57 PM By: Gelene Mink By: Samuella Bruin on 07/22/2023 09:52:25 -------------------------------------------------------------------------------- Nutrition Risk Screening Details Patient Name: Date of Service: Eduardo [name] 07/22/2023 12:45 PM Medical Record Number: [id number] Patient Account Number: [id number] Date of Birth/Sex: Treating RN: June 24, 1971 (52 y.o. Eduardo [name] Primary Care Kerrigan Glendening: Saralyn Pilar Other Clinician: Referring Kiyo Heal: Treating Kelsee Preslar/Extender: Lina Sar in Treatment: 0 Height (in): 77 Weight (lbs): 330 Body Mass Index (BMI): 39.1 Nutrition Risk Screening Items Score Screening NUTRITION RISK SCREEN: I have an illness or condition that made me change the kind and/or amount of food I eat 0 No I eat fewer than two meals per day 0 No I eat few fruits and vegetables, or milk products 0 No I have three or more drinks of beer, liquor or wine almost every day 0 No I have tooth or mouth problems that make it hard for me to eat 0 No I don't always have enough money to buy the food I need 0 No I eat alone most of the time 0 No I take three or more different prescribed or over-the-counter drugs a day 1 Yes Without wanting to, I have lost or gained 10 pounds in the last six months 0 No I am not always physically able to shop, cook and/or feed myself 0 No Nutrition Protocols Good Risk Protocol 0 No interventions  needed Moderate Risk Protocol High Risk Proctocol Risk Level: Good Risk Score: 1 Electronic Signature(s) Signed: 07/22/2023 3:36:57 PM By: Samuella Bruin Entered By: Samuella Bruin on 07/22/2023 09:49:32

## 2023-07-22 NOTE — Patient Instructions (Signed)
Follow up as needed.   Consider seeing a surgeon for possible hernia.   _______________________________________________________  If your blood pressure at your visit was 140/90 or greater, please contact your primary care physician to follow up on this.  _______________________________________________________  If you are age 54 or older, your body mass index should be between 23-30. Your Body mass index is 40.29 kg/m. If this is out of the aforementioned range listed, please consider follow up with your Primary Care Provider.  If you are age 68 or younger, your body mass index should be between 19-25. Your Body mass index is 40.29 kg/m. If this is out of the aformentioned range listed, please consider follow up with your Primary Care Provider.   ________________________________________________________  The [facility] providers would like to encourage you to use [facility] to communicate with providers for non-urgent requests or questions.  Due to long hold times on the telephone, sending your provider a message by [facility] may be a faster and more efficient way to get a response.  Please allow 48 business hours for a response.  Please remember that this is for non-urgent requests.  _______________________________________________________

## 2023-07-22 NOTE — Progress Notes (Signed)
07/22/2023 Eduardo [name] [id number] 05-10-1971   HISTORY OF PRESENT ILLNESS: This is a 52 year old male who is new to our office.  He has been referred here by Saralyn Pilar, PA, for evaluation of left lower quadrant abdominal pain.  He tells me that this pain has been present intermittently for several years.  He says that back 15 years or more ago he had a severe coughing fit and felt something tear on the left side of his abdomen.  He really thinks it was since then that he has been having intermittent pain.  It seems to occur more so with certain movements.  He has had CT scans performed with no cause of his pain seen.  He has never had a colonoscopy, but had a negative Cologuard just a couple of months ago.  He denies any nausea, vomiting, constipation, diarrhea.  He says that it really seems to occur much less frequently at this point.  Says that the abdominal pain has not really been so significant, but when he gets that it also causes a severe pulling pain in his testicles.  Looks like they may have ultrasound his testicles in the past as well.  Says that he had seen urology, but he says that they did not really do anything.   Past Medical History:  Diagnosis Date   Anxiety    Chronic back pain    Degenerative disk disease    Depression    DVT of upper extremity (deep vein thrombosis) (HCC)    GERD (gastroesophageal reflux disease)    Hypertension    MRSA (methicillin resistant staph aureus) culture positive    PAD (peripheral artery disease) (HCC)    Peripheral neuropathy    Pilonidal cyst    Pruritus 12/23/2021   Pulmonary embolus (HCC)    no blood thinner 2002   Sleep apnea    can't use cpap due to deviated nasal septumg   Past Surgical History:  Procedure Laterality Date   Irrigation and debridement of back abscess     MASS EXCISION Right 06/14/2019   Procedure: EXCISION BENIGN LESION RIGHT FOOT;  Surgeon: Park Liter, DPM;  Location: [facility];  Service:  Podiatry;  Laterality: Right;   NASAL SEPTOPLASTY W/ TURBINOPLASTY [name] 02/03/2018   Procedure: NASAL SEPTOPLASTY WITH TURBINATE REDUCTION;  Surgeon: Suzanna Obey, MD;  Location: [facility] OR;  Service: ENT;  Laterality: [name];   PILONIDAL CYST DRAINAGE [name] 04/10/2017   Procedure: IRRIGATION AND DEBRIDEMENT BACK ABSCESS;  Surgeon: Karie Soda, MD;  Location: [facility];  Service: General;  Laterality: [name];   SINUS ENDO WITH FUSION [name] 02/03/2018   Procedure: ENDOSCOPIC SINUS SURGERY WITH FUSION;  Surgeon: Suzanna Obey, MD;  Location: [facility] OR;  Service: ENT;  Laterality: [name];   THORACIC OUTLET SURGERY  2000   TOE ARTHROPLASTY Right 06/14/2019   Procedure: HALLUX ARTHROPLASTY RIGHT FOOT WITH TISSUE TRANSER EXCISION OF SESAMOID BONE;  Surgeon: Park Liter, DPM;  Location: [facility];  Service: Podiatry;  Laterality: Right;   WRIST FUSION  2002    reports that he has been smoking cigarettes. He has a 33 pack-year smoking history. He has never been exposed to tobacco smoke. He has never used smokeless tobacco. He reports that he does not drink alcohol and does not use drugs. family history includes Cancer in his maternal grandmother; Diabetes in his mother; Heart disease in his father; Hypertension in his father and mother; Pancreatic cancer in his brother; Stroke in his paternal grandfather.  Allergies  Allergen Reactions   Pollen Extract Cough   Dust Mite Extract Other (See Comments)   Other Other (See Comments)    Ragweed       Outpatient Encounter Medications as of 07/22/2023  Medication Sig   Acetaminophen (TYLENOL ARTHRITIS PAIN PO) Take 600 mg by mouth 3 (three) times daily.   desvenlafaxine (PRISTIQ) 50 MG 24 hr tablet Take 1 tablet (50 mg total) by mouth daily.   mupirocin ointment (BACTROBAN) 2 % Apply 1 Application topically daily.   oxyCODONE-acetaminophen (PERCOCET) 10-325 MG tablet Take 1 tablet by mouth every 6 (six) hours as needed for pain.   QUEtiapine (SEROQUEL) 25 MG tablet TAKE 1 TABLET BY  MOUTH EVERY NIGHT AT BEDTIME   [DISCONTINUED] montelukast (SINGULAIR) 10 MG tablet Take 1 tablet (10 mg total) by mouth at bedtime.   [DISCONTINUED] tirzepatide (ZEPBOUND) 2.5 MG/0.5ML Pen Inject 2.5 mg into the skin once a week.   [DISCONTINUED] nicotine (NICOTROL) 10 MG inhaler 1 continuous puffing    No facility-administered encounter medications on file as of 07/22/2023.    REVIEW OF SYSTEMS  : All other systems reviewed and negative except where noted in the History of Present Illness.   PHYSICAL EXAM: BP (!) 140/78   Pulse 84   Ht 6\' 5"  (1.956 m)   Wt (!) 339 lb 12.8 oz (154.1 kg)   BMI 40.29 kg/m  General: Well developed white male in no acute distress Head: Normocephalic and atraumatic Eyes:  Sclerae anicteric, conjunctiva pink. Ears: Normal auditory acuity Lungs: Clear throughout to auscultation; no W/R/R. Heart: Regular rate and rhythm; no M/R/G. Abdomen: Soft, non-distended.  BS present.  Diastasis recti noted.  Minimal TTP in LLQ. Musculoskeletal: Symmetrical with no gross deformities  Skin: No lesions on visible extremities Extremities: No edema  Neurological: Alert oriented x 4, grossly nonfocal Cervical Nodes:  No significant cervical adenopathy Psychological:  Alert and cooperative. Normal mood and affect  ASSESSMENT AND PLAN: *Left-sided/LLQ abdominal pain: This has been intermittent for several years.  Sounds like it occurs more with certain movements, etc.  Sounds musculoskeletal or possible hernia.  No definite hernia has been seen on imaging, however.  He also describes an associated pulling sensation in his testicles when this occurs.  He has had CT scans performed that have shown no cause of his symptoms.  Never had a colonoscopy, but just had a negative Cologuard a few months ago.  Told him we can certainly offer him a colonoscopy for evaluation, but I think with the longevity of symptoms, negative Cologuard, and unremarkable CT scans that the yield of [place]  finding something on a colonoscopy that is causing this pain is low.  He agrees and declines to schedule colonoscopy at this time.  Could consider seeing a Careers adviser.   CC:  Melida Quitter, PA

## 2023-07-26 NOTE — Progress Notes (Unsigned)
Patient name: Eduardo [name] MRN: [id number] DOB: 1971/01/08 Sex: male  REASON FOR CONSULT: Bilateral lower extremity ulceration with fat layer exposed  HPI: Eduardo [name] is a 52 y.o. male, with history of upper extremity DVT/TOS, hypertension, tobacco abuse that presents for evaluation of bilateral lower extremity ulcers with fat layer exposed.  Patient is followed by Dr. Verna Czech with podiatry.  He did have ABIs on 06/24/2023 that were 1.11 on the right triphasic and 1.18 on the left triphasic.  Patient states the ulcer on his left foot has been there for several months.  The ulcers on his right foot have been present for years.  He is now going to the wound clinic.  States he smokes 1.5 packs/day.  Past Medical History:  Diagnosis Date   Anxiety    Chronic back pain    Degenerative disk disease    Depression    DVT of upper extremity (deep vein thrombosis) (HCC)    GERD (gastroesophageal reflux disease)    Hypertension    MRSA (methicillin resistant staph aureus) culture positive    PAD (peripheral artery disease) (HCC)    Peripheral neuropathy    Pilonidal cyst    Pruritus 12/23/2021   Pulmonary embolus (HCC)    no blood thinner 2002   Sleep apnea    can't use cpap due to deviated nasal septumg    Past Surgical History:  Procedure Laterality Date   Irrigation and debridement of back abscess     MASS EXCISION Right 06/14/2019   Procedure: EXCISION BENIGN LESION RIGHT FOOT;  Surgeon: Park Liter, DPM;  Location: [facility];  Service: Podiatry;  Laterality: Right;   NASAL SEPTOPLASTY W/ TURBINOPLASTY [name] 02/03/2018   Procedure: NASAL SEPTOPLASTY WITH TURBINATE REDUCTION;  Surgeon: Suzanna Obey, MD;  Location: [facility] OR;  Service: ENT;  Laterality: [name];   PILONIDAL CYST DRAINAGE [name] 04/10/2017   Procedure: IRRIGATION AND DEBRIDEMENT BACK ABSCESS;  Surgeon: Karie Soda, MD;  Location: [facility];  Service: General;  Laterality: [name];   SINUS ENDO WITH FUSION [name] 02/03/2018    Procedure: ENDOSCOPIC SINUS SURGERY WITH FUSION;  Surgeon: Suzanna Obey, MD;  Location: [facility] OR;  Service: ENT;  Laterality: [name];   THORACIC OUTLET SURGERY  2000   TOE ARTHROPLASTY Right 06/14/2019   Procedure: HALLUX ARTHROPLASTY RIGHT FOOT WITH TISSUE TRANSER EXCISION OF SESAMOID BONE;  Surgeon: Park Liter, DPM;  Location: [facility];  Service: Podiatry;  Laterality: Right;   WRIST FUSION  2002    Family History  Problem Relation Age of Onset   Hypertension Mother    Diabetes Mother    Hypertension Father    Heart disease Father    Pancreatic cancer Brother    Cancer Maternal Grandmother        breast, ovarian & colon   Stroke Paternal Grandfather    Neuropathy Neg Hx    Colon cancer Neg Hx    Esophageal cancer Neg Hx    Liver disease Neg Hx     SOCIAL HISTORY: Social History   Socioeconomic History   Marital status: Divorced    Spouse name: Not on file   Number of children: 2   Years of education: 12th    Highest education level: Not on file  Occupational History   Occupation: [name]  Tobacco Use   Smoking status: Every Day    Current packs/day: 1.00    Average packs/day: 1 pack/day for 33.0 years (33.0 ttl pk-yrs)    Types: Cigarettes  Passive exposure: Never   Smokeless tobacco: Never   Tobacco comments:    1pack per day 09/22/2022  Vaping Use   Vaping status: Former  Substance and Sexual Activity   Alcohol use: No    Alcohol/week: 0.0 standard drinks of alcohol    Comment: Rarely   Drug use: No   Sexual activity: Yes    Birth control/protection: None  Other Topics Concern   Not on file  Social History Narrative   Lives at home w/ his step-mother   Right-handed   Drinks about 2-3 Mt Dews a day   Social Determinants of Corporate investment banker Strain: Not on file  Food Insecurity: Not on file  Transportation Needs: Not on file  Physical Activity: Not on file  Stress: Not on file  Social Connections: Not on file  Intimate Partner Violence: Not on  file    Allergies  Allergen Reactions   Pollen Extract Cough   Dust Mite Extract Other (See Comments)   Other Other (See Comments)    Ragweed     Current Outpatient Medications  Medication Sig Dispense Refill   Acetaminophen (TYLENOL ARTHRITIS PAIN PO) Take 600 mg by mouth 3 (three) times daily.     desvenlafaxine (PRISTIQ) 50 MG 24 hr tablet Take 1 tablet (50 mg total) by mouth daily. 30 tablet 3   mupirocin ointment (BACTROBAN) 2 % Apply 1 Application topically daily. 22 g 3   oxyCODONE-acetaminophen (PERCOCET) 10-325 MG tablet Take 1 tablet by mouth every 6 (six) hours as needed for pain.     QUEtiapine (SEROQUEL) 25 MG tablet TAKE 1 TABLET BY MOUTH EVERY NIGHT AT BEDTIME 30 tablet 2   No current facility-administered medications for this visit.    REVIEW OF SYSTEMS:  [X]  denotes positive finding, [ ]  denotes negative finding Cardiac  Comments:  Chest pain or chest pressure:    Shortness of breath upon exertion:    Short of breath when lying flat:    Irregular heart rhythm:        Vascular    Pain in calf, thigh, or hip brought on by ambulation:    Pain in feet at night that wakes you up from your sleep:     Blood clot in your veins:    Leg swelling:         Pulmonary    Oxygen at home:    Productive cough:     Wheezing:         Neurologic    Sudden weakness in arms or legs:     Sudden numbness in arms or legs:     Sudden onset of difficulty speaking or slurred speech:    Temporary loss of vision in one eye:     Problems with dizziness:         Gastrointestinal    Blood in stool:     Vomited blood:         Genitourinary    Burning when urinating:     Blood in urine:        Psychiatric    Major depression:         Hematologic    Bleeding problems:    Problems with blood clotting too easily:        Skin    Rashes or ulcers:        Constitutional    Fever or chills:      PHYSICAL EXAM: There were no vitals filed for this visit.  GENERAL: The  patient is a well-nourished male, in no acute distress. The vital signs are documented above. CARDIAC: There is a regular rate and rhythm.  VASCULAR:  Bilateral femoral pulses palpable Bilateral PT pulses palpable Ulcer on the plantar surface of the left fifth metatarsal as pictured Ulcer on the plantar surface of the right fifth metatarsal as pictured Missing toenails on right first second and fourth toes as well as an ulcer between the right first and second toe PULMONARY: No respiratory distress ABDOMEN: Soft and non-tender. MUSCULOSKELETAL: There are no major deformities or cyanosis. NEUROLOGIC: No focal weakness or paresthesias are detected. PSYCHIATRIC: The patient has a normal affect.    DATA:   ABIs on 06/24/2023 that were 1.11 on the right triphasic and 1.18 on the left triphasic.  Assessment/Plan:  52 y.o. male, with history of upper extremity DVT/TOS, hypertension, tobacco abuse that presents for evaluation of bilateral lower extremity ulcers with fat layer exposed.  Patient is followed by Dr. Verna Czech with podiatry.  Discussed that his ABIs are normal today.  He has palpable PT pulses bilaterally.  He also has pulsatile toe tracings that appear adequate for wound healing.  I anticipate he has enough inflow for wound healing.  He is now going to the wound clinic and is also followed by podiatry.  Discussed I will see him in 1 month.  I will get lower extremity arterial duplex bilaterally just to ensure we are not missing any flow-limiting stenosis given the chronicity of his wounds particularly in the right leg that he states has present for years.   Cephus Shelling, MD [facility] Office: [phone]

## 2023-07-27 ENCOUNTER — Encounter: Payer: Self-pay | Admitting: Vascular Surgery

## 2023-07-27 ENCOUNTER — Ambulatory Visit (INDEPENDENT_AMBULATORY_CARE_PROVIDER_SITE_OTHER): Payer: Medicaid Other | Admitting: Vascular Surgery

## 2023-07-27 VITALS — BP 151/89 | HR 76 | Temp 98.0°F | Resp 18 | Ht 77.0 in | Wt 343.6 lb

## 2023-07-27 DIAGNOSIS — L97512 Non-pressure chronic ulcer of other part of right foot with fat layer exposed: Secondary | ICD-10-CM | POA: Diagnosis not present

## 2023-07-27 DIAGNOSIS — L97522 Non-pressure chronic ulcer of other part of left foot with fat layer exposed: Secondary | ICD-10-CM

## 2023-07-29 ENCOUNTER — Telehealth: Payer: Self-pay

## 2023-07-29 ENCOUNTER — Encounter: Payer: Self-pay | Admitting: Podiatry

## 2023-07-29 ENCOUNTER — Ambulatory Visit: Payer: Medicaid Other | Admitting: Podiatry

## 2023-07-29 DIAGNOSIS — L97512 Non-pressure chronic ulcer of other part of right foot with fat layer exposed: Secondary | ICD-10-CM | POA: Diagnosis not present

## 2023-07-29 DIAGNOSIS — L97522 Non-pressure chronic ulcer of other part of left foot with fat layer exposed: Secondary | ICD-10-CM

## 2023-07-29 NOTE — Telephone Encounter (Signed)
[employer] called stating that we needed a signed appeal from the patient giving [place] permission to submit it on his behalf.   Called pt and he stated that after hearing all the monster stories he is no longer interested in the medication.

## 2023-07-29 NOTE — Progress Notes (Signed)
Subjective:  Patient ID: Eduardo [name], male    DOB: [birth year],  MRN: [id number]  Chief Complaint  Patient presents with   Foot Ulcer    Bilateral foot ulcers    52 y.o. male presents For follow-up bilateral foot ulcers.  He has been going to wound care clinic.  They are treating him with absorbent dressing for bilateral foot ulcers.  Discontinue use of mupirocin per wound care orders.  He reports overall he thinks the ulcers are doing well.   Past Medical History:  Diagnosis Date   Anxiety    Chronic back pain    Degenerative disk disease    Depression    DVT of upper extremity (deep vein thrombosis) (HCC)    GERD (gastroesophageal reflux disease)    Hypertension    MRSA (methicillin resistant staph aureus) culture positive    PAD (peripheral artery disease) (HCC)    Peripheral neuropathy    Pilonidal cyst    Pruritus 12/23/2021   Pulmonary embolus (HCC)    no blood thinner 2002   Sleep apnea    can't use cpap due to deviated nasal septumg    Allergies  Allergen Reactions   Pollen Extract Cough   Dust Mite Extract Other (See Comments)   Other Other (See Comments)    Ragweed     ROS: Negative except as per HPI above  Objective:  General: AAO x3, NAD  Dermatological: Ulceration present bilateral plantar forefoot.  On the left foot wound probes to subcutaneous fat tissue.  Measures approximately 1 x 0.8 x 0.2 cm postdebridement improved from prior.  Centered at the plantar aspect of the first and second metatarsal head area.  No evidence of infection   On the right foot there is scattered wounds plantar first and second metatarsal head measuring 1.5 x 1.8 x 0.2 postdebridement.  Healthy granular tissue base.  Probes subcutaneous fat tissue no erythema or drainage also some maceration wound present in the first webspace to subcutaneous fat tissue   Vascular: Diminished pedal pulses bilaterally  Neruologic: Grossly diminished via light touch bilateral protective  sensation is absent to the forefoot  Musculoskeletal: No gross boney pedal deformities bilateral. No pain, crepitus, or limitation noted with foot and ankle range of motion bilateral. Muscular strength 5/5 in all groups tested bilateral.  Gait: Unassisted, Nonantalgic.   No images are attached to the encounter.   Assessment:   1. Ulcer of right foot with fat layer exposed (HCC)   2. Ulcer of left foot with fat layer exposed (HCC)        Plan:  Patient was evaluated and treated and all questions answered.  Ulcer bilateral plantar forefoot -Improving with wound care - dressing changes daily with absorbent dressing.  Applied collagen dressing today -We discussed the etiology and factors that are a part of the wound healing process.  We also discussed the risk of infection both soft tissue and osteomyelitis from open ulceration.  Discussed the risk of limb loss if this happens or worsens. -Debridement as below. -Dressed with collagen and adhesive dressing -Continue home dressing changes daily per wound care center recommendations currently absorbent silver dressing gauze -Continue off-loading with surgical shoe.   -Vascular testing completed and showed normal ABI PVR study unchanged from prior study in 2021 -HgbA1c: Within normal limits -Last antibiotics: Previously a course of doxycycline will defer further antibiotics is no evidence of infection at this time -Imaging: Deferred due to superficial nature of the wounds -Appreciate wound care assistance  with managing these wounds  Procedure: Excisional Debridement of Wound Rationale: Removal of non-viable soft tissue from the wound to promote healing.  Anesthesia: none Post-Debridement Wound Measurements:  1 x 0.8 x 0.2 cm LEFT foot. Right foot 1.5 x 1.8 x 0.2  Type of Debridement: Sharp Excisional Tissue Removed: Non-viable soft tissue Depth of Debridement: subcutaneous tissue. Technique: Sharp excisional debridement to bleeding,  viable wound base.  Dressing: Dry, sterile, compression dressing. Disposition: Patient tolerated procedure well.     Return in about 3 weeks (around 08/19/2023) for f/u bilateral foot ulcers.          Corinna Gab, DPM [facility]

## 2023-07-30 ENCOUNTER — Encounter (HOSPITAL_BASED_OUTPATIENT_CLINIC_OR_DEPARTMENT_OTHER): Payer: Medicaid Other | Admitting: General Surgery

## 2023-07-30 DIAGNOSIS — L97512 Non-pressure chronic ulcer of other part of right foot with fat layer exposed: Secondary | ICD-10-CM | POA: Diagnosis not present

## 2023-07-30 NOTE — Progress Notes (Signed)
ARELI, JOWETT ([id number]) [id number] Page 1 of 10 Visit Report for 07/30/2023 Arrival Information Details Patient Name: Date of Service: Eduardo [name] 07/30/2023 9:00 A M Medical Record Number: [id number] Patient Account Number: [id number] Date of Birth/Sex: Treating RN: Mar 08, 1971 (52 y.o. Eduardo [name] Primary Care Gilberta Peeters: Saralyn Pilar Other Clinician: Referring Taia Bramlett: Treating Sneha Willig/Extender: Harrie Jeans Weeks in Treatment: 1 Visit Information History Since Last Visit Added or deleted any medications: No Patient Arrived: Ambulatory Any new allergies or adverse reactions: No Arrival Time: 09:26 Had a fall or experienced change in No Accompanied By: self activities of daily living that may affect Transfer Assistance: Manual risk of falls: Patient Identification Verified: Yes Signs or symptoms of abuse/neglect since last visito No Secondary Verification Process Completed: Yes Hospitalized since last visit: No Patient Has Alerts: Yes Implantable device outside of the clinic excluding No Patient Alerts: ABIs: R:1.11 L:1.18 9/24 cellular tissue based products placed in the center TBIs: R:1.13 L:1.02 9/24 since last visit: Has Dressing in Place as Prescribed: No Pain Present Now: No Electronic Signature(s) Signed: 07/30/2023 11:32:03 AM By: Samuella Bruin Entered By: Samuella Bruin on 07/30/2023 06:26:45 -------------------------------------------------------------------------------- Encounter Discharge Information Details Patient Name: Date of Service: Eduardo Ruder W. 07/30/2023 9:00 A M Medical Record Number: [id number] Patient Account Number: [id number] Date of Birth/Sex: Treating RN: October 04, 1971 (52 y.o. Eduardo [name] Primary Care Cozetta Seif: Saralyn Pilar Other Clinician: Referring Kanyah Matsushima: Treating Omare Bilotta/Extender: Park Liter in  Treatment: 1 Encounter Discharge Information Items Post Procedure Vitals Discharge Condition: Stable Temperature (F): 97.6 Ambulatory Status: Ambulatory Pulse (bpm): 85 Discharge Destination: Home Respiratory Rate (breaths/min): 18 Transportation: Private Auto Blood Pressure (mmHg): 174/93 Accompanied By: self Schedule Follow-up Appointment: Yes Clinical Summary of Care: Patient Declined Electronic Signature(s) Signed: 07/30/2023 11:32:03 AM By: Samuella Bruin Entered By: Samuella Bruin on 07/30/2023 06:56:19 Eduardo [name] ([id number]) [id number] Page 2 of 10 -------------------------------------------------------------------------------- Lower Extremity Assessment Details Patient Name: Date of Service: Eduardo [name] 07/30/2023 9:00 A M Medical Record Number: [id number] Patient Account Number: [id number] Date of Birth/Sex: Treating RN: 11/15/1970 (52 y.o. Eduardo [name] Primary Care Wendle Kina: Saralyn Pilar Other Clinician: Referring Stevi Hollinshead: Treating Laterria Lasota/Extender: Harrie Jeans Weeks in Treatment: 1 Edema Assessment Assessed: Eduardo [name]: No] Eduardo [name]: No] [Left: Edema] [Right: :] Calf Left: Right: Point of Measurement: From Medial Instep 48.5 cm 49.4 cm Ankle Left: Right: Point of Measurement: From Medial Instep 29.5 cm 30 cm Vascular Assessment Extremity colors, hair growth, and conditions: Extremity Color: [Left:Hyperpigmented] [Right:Hyperpigmented] Hair Growth on Extremity: [Left:Yes] [Right:Yes] Temperature of Extremity: [Left:Warm] [Right:Warm] Capillary Refill: [Left:< 3 seconds] [Right:< 3 seconds] Dependent Rubor: [Left:No No] [Right:No No] Electronic Signature(s) Signed: 07/30/2023 11:32:03 AM By: Samuella Bruin Entered By: Samuella Bruin on 07/30/2023 06:27:09 -------------------------------------------------------------------------------- Multi Wound Chart Details Patient  Name: Date of Service: Eduardo Ruder W. 07/30/2023 9:00 A M Medical Record Number: [id number] Patient Account Number: [id number] Date of Birth/Sex: Treating RN: 1971-03-17 (52 y.o. M) Primary Care Iyonna Rish: Saralyn Pilar Other Clinician: Referring Jemmie Ledgerwood: Treating Jakaila Norment/Extender: Harrie Jeans Weeks in Treatment: 1 Vital Signs Height(in): 77 Pulse(bpm): 85 Weight(lbs): 330 Blood Pressure(mmHg): 174/93 Body Mass Index(BMI): 39.1 Temperature(F): 97.6 Respiratory Rate(breaths/min): 16 [1:Photos:] [[id number] Page 3 of 10] Right Metatarsal head second Left Metatarsal head first Right T - Web between 1st and 2nd oe Wound Location: Thermal Burn Gradually Appeared Gradually Appeared Wounding Event: Neuropathic Ulcer-Non Diabetic Neuropathic Ulcer-Non Diabetic Neuropathic  Ulcer-Non Diabetic Primary Etiology: Lymphedema, Chronic Obstructive Lymphedema, Chronic Obstructive Lymphedema, Chronic Obstructive Comorbid History: Pulmonary Disease (COPD), Sleep Pulmonary Disease (COPD), Sleep Pulmonary Disease (COPD), Sleep Apnea, Deep Vein Thrombosis, Apnea, Deep Vein Thrombosis, Apnea, Deep Vein Thrombosis, Peripheral Arterial Disease, Peripheral Arterial Disease, Peripheral Arterial Disease, Osteoarthritis, Neuropathy Osteoarthritis, Neuropathy Osteoarthritis, Neuropathy 04/19/2023 05/20/2023 07/29/2023 Date Acquired: 1 1 0 Weeks of Treatment: Open Open Open Wound Status: No No No Wound Recurrence: 1.5x1x0.2 0.7x0.6x0.1 0.5x0.2x0.1 Measurements L x W x D (cm) 1.178 0.33 0.079 A (cm) : rea 0.236 0.033 0.008 Volume (cm) : 45.70% 25.00% [name] % Reduction in A rea: 45.60% 62.50% [name] % Reduction in Volume: Full Thickness Without Exposed Full Thickness Without Exposed Full Thickness Without Exposed Classification: Support Structures Support Structures Support Structures Medium Medium Medium Exudate A mount: Serosanguineous  Serosanguineous Serosanguineous Exudate Type: red, brown red, brown red, brown Exudate Color: Distinct, outline attached Distinct, outline attached Distinct, outline attached Wound Margin: Large (67-100%) Large (67-100%) Large (67-100%) Granulation A mount: Red Red, Pink Red Granulation Quality: Small (1-33%) Small (1-33%) None Present (0%) Necrotic A mount: Fat Layer (Subcutaneous Tissue): Yes Fat Layer (Subcutaneous Tissue): Yes Fat Layer (Subcutaneous Tissue): Yes Exposed Structures: Fascia: No Fascia: No Fascia: No Tendon: No Tendon: No Tendon: No Muscle: No Muscle: No Muscle: No Joint: No Joint: No Joint: No Bone: No Bone: No Bone: No Small (1-33%) Small (1-33%) Small (1-33%) Epithelialization: Debridement - Selective/Open Wound Debridement - Selective/Open Wound Debridement - Selective/Open Wound Debridement: Pre-procedure Verification/Time Out 09:40 09:40 09:40 Taken: Lidocaine 4% T opical Solution Lidocaine 4% Topical Solution Lidocaine 4% Topical Solution Pain Control: Callus, Slough Slough Callus Tissue Debrided: Skin/Epidermis Skin/Epidermis Skin/Epidermis Level: 1.18 0.33 0.08 Debridement A (sq cm): rea Curette Curette Curette Instrument: Minimum Minimum Minimum Bleeding: Pressure Pressure Pressure Hemostasis A chieved: 0 0 0 Procedural Pain: 0 0 0 Post Procedural Pain: Procedure was tolerated well Procedure was tolerated well Procedure was tolerated well Debridement Treatment Response: 1.5x1x0.2 0.7x0.6x0.1 0.5x0.2x0.1 Post Debridement Measurements L x W x D (cm) 0.236 0.033 0.008 Post Debridement Volume: (cm) Callus: Yes Callus: Yes Callus: Yes Periwound Skin Texture: No Abnormalities Noted No Abnormalities Noted No Abnormalities Noted Periwound Skin Moisture: No Abnormalities Noted No Abnormalities Noted No Abnormalities Noted Periwound Skin Color: No Abnormality No Abnormality No Abnormality Temperature: Debridement Debridement  Debridement Procedures Performed: Treatment Notes Electronic Signature(s) Signed: 07/30/2023 9:50:38 AM By: Duanne Guess MD FACS Entered By: Duanne Guess on 07/30/2023 06:50:38 -------------------------------------------------------------------------------- Multi-Disciplinary Care Plan Details Patient Name: Date of Service: Eduardo Ruder W. 07/30/2023 9:00 A M Medical Record Number: [id number] Patient Account Number: [id number] Date of Birth/Sex: Treating RN: 04/30/1971 (52 y.o. [name] Primary Care Maeola Mchaney: Saralyn Pilar Other Clinician: Referring Ferrell Claiborne: Treating Icess Bertoni/Extender: Harrie Jeans Eduardo [name] ([id number]) [id number] Page 4 of 10 Weeks in Treatment: 1 Multidisciplinary Care Plan reviewed with physician Active Inactive Peripheral Neuropathy Nursing Diagnoses: Knowledge deficit related to disease process and management of peripheral neurovascular dysfunction Potential alteration in peripheral tissue perfusion (select prior to confirmation of diagnosis) Goals: Patient/caregiver will verbalize understanding of disease process and disease management Date Initiated: 07/22/2023 Target Resolution Date: 09/24/2023 Goal Status: Active Interventions: Assess signs and symptoms of neuropathy upon admission and as needed Provide education on Management of Neuropathy and Related Ulcers Notes: Wound/Skin Impairment Nursing Diagnoses: Impaired tissue integrity Knowledge deficit related to ulceration/compromised skin integrity Goals: Patient will demonstrate a reduced rate of smoking or cessation of smoking Date Initiated: 07/22/2023 Target Resolution Date:  09/24/2023 Goal Status: Active Patient/caregiver will verbalize understanding of skin care regimen Date Initiated: 07/22/2023 Target Resolution Date: 09/24/2023 Goal Status: Active Interventions: Assess patient/caregiver ability to obtain  necessary supplies Assess patient/caregiver ability to perform ulcer/skin care regimen upon admission and as needed Assess ulceration(s) every visit Provide education on smoking Treatment Activities: Referred to DME Shaunette Gassner for dressing supplies : 07/22/2023 Skin care regimen initiated : 07/22/2023 Topical wound management initiated : 07/22/2023 Notes: Electronic Signature(s) Signed: 07/30/2023 11:51:00 AM By: Zenaida Deed RN, BSN Entered By: Zenaida Deed on 07/30/2023 06:29:41 -------------------------------------------------------------------------------- Pain Assessment Details Patient Name: Date of Service: Eduardo Ruder W. 07/30/2023 9:00 A M Medical Record Number: [id number] Patient Account Number: [id number] Date of Birth/Sex: Treating RN: May 31, 1971 (52 y.o. Eduardo [name] Primary Care Alette Kataoka: Saralyn Pilar Other Clinician: Referring Adelyn Roscher: Treating Jerlisa Diliberto/Extender: Harrie Jeans Weeks in Treatment: 1 Active Problems Location of Pain Severity and Description of Pain KORBY, RATAY ([id number]) [phone].pdf Page 5 of 10 Patient Has Paino No Site Locations Rate the pain. Current Pain Level: 0 Pain Management and Medication Current Pain Management: Electronic Signature(s) Signed: 07/30/2023 11:32:03 AM By: Samuella Bruin Entered By: Samuella Bruin on 07/30/2023 06:27:07 -------------------------------------------------------------------------------- Patient/Caregiver Education Details Patient Name: Date of Service: Eduardo [name] 10/11/2024andnbsp9:00 A M Medical Record Number: [id number] Patient Account Number: [id number] Date of Birth/Gender: Treating RN: 29-Mar-1971 (52 y.o. [name] Primary Care Physician: Saralyn Pilar Other Clinician: Referring Physician: Treating Physician/Extender: Park Liter in Treatment: 1 Education  Assessment Education Provided To: Patient Education Topics Provided Offloading: Methods: Explain/Verbal Responses: Reinforcements needed, State content correctly Wound/Skin Impairment: Methods: Explain/Verbal Responses: Reinforcements needed, State content correctly Electronic Signature(s) Signed: 07/30/2023 11:51:00 AM By: Zenaida Deed RN, BSN Entered By: Zenaida Deed on 07/30/2023 06:30:11 Eduardo [name] ([id number]) [id number] Page 6 of 10 -------------------------------------------------------------------------------- Wound Assessment Details Patient Name: Date of Service: Eduardo [name] 07/30/2023 9:00 A M Medical Record Number: [id number] Patient Account Number: [id number] Date of Birth/Sex: Treating RN: 22-Jan-1971 (52 y.o. Eduardo [name] Primary Care Amonie Wisser: Saralyn Pilar Other Clinician: Referring Kathlean Cinco: Treating Synthia Fairbank/Extender: Harrie Jeans Weeks in Treatment: 1 Wound Status Wound Number: 1 Primary Neuropathic Ulcer-Non Diabetic Etiology: Wound Location: Right Metatarsal head second Wound Open Wounding Event: Thermal Burn Status: Date Acquired: 04/19/2023 Comorbid Lymphedema, Chronic Obstructive Pulmonary Disease (COPD), Weeks Of Treatment: 1 History: Sleep Apnea, Deep Vein Thrombosis, Peripheral Arterial Disease, Clustered Wound: No Osteoarthritis, Neuropathy Photos Wound Measurements Length: (cm) 1.5 Width: (cm) 1 Depth: (cm) 0.2 Area: (cm) 1.178 Volume: (cm) 0.236 % Reduction in Area: 45.7% % Reduction in Volume: 45.6% Epithelialization: Small (1-33%) Tunneling: No Undermining: No Wound Description Classification: Full Thickness Without Exposed Support Structures Wound Margin: Distinct, outline attached Exudate Amount: Medium Exudate Type: Serosanguineous Exudate Color: red, brown Foul Odor After Cleansing: No Slough/Fibrino Yes Wound Bed Granulation Amount: Large  (67-100%) Exposed Structure Granulation Quality: Red Fascia Exposed: No Necrotic Amount: Small (1-33%) Fat Layer (Subcutaneous Tissue) Exposed: Yes Necrotic Quality: Adherent Slough Tendon Exposed: No Muscle Exposed: No Joint Exposed: No Bone Exposed: No Periwound Skin Texture Texture Color No Abnormalities Noted: No No Abnormalities Noted: Yes Callus: Yes Temperature / Pain Temperature: No Abnormality Moisture No Abnormalities Noted: Yes Treatment Notes Wound #1 (Metatarsal head second) Wound Laterality: Right Cleanser Soap and Water Discharge Instruction: May shower and wash wound with dial antibacterial soap and water prior to dressing change. Wound Cleanser Discharge Instruction: Cleanse the wound  with wound cleanser prior to applying a clean dressing using gauze sponges, not tissue or cotton balls. Peri-Wound Care Eduardo [name], Eduardo [name] ([id number]) [phone].pdf Page 7 of 10 Topical Primary Dressing Maxorb Extra Ag+ Alginate Dressing, 2x2 (in/in) Discharge Instruction: Apply to wound bed as instructed Secondary Dressing Woven Gauze Sponge, Non-Sterile 4x4 in Discharge Instruction: Apply over primary dressing as directed. Secured With Conforming Stretch Gauze Bandage, Sterile 2x75 (in/in) Discharge Instruction: Secure with stretch gauze as directed. Compression Wrap Compression Stockings Add-Ons Electronic Signature(s) Signed: 07/30/2023 11:32:03 AM By: Samuella Bruin Entered By: Samuella Bruin on 07/30/2023 06:34:05 -------------------------------------------------------------------------------- Wound Assessment Details Patient Name: Date of Service: Eduardo Ruder W. 07/30/2023 9:00 A M Medical Record Number: [id number] Patient Account Number: [id number] Date of Birth/Sex: Treating RN: December 18, 1970 (52 y.o. Eduardo [name] Primary Care Wladyslawa Disbro: Saralyn Pilar Other Clinician: Referring Praneel Haisley: Treating  Ifeoluwa Bartz/Extender: Harrie Jeans Weeks in Treatment: 1 Wound Status Wound Number: 2 Primary Neuropathic Ulcer-Non Diabetic Etiology: Wound Location: Left Metatarsal head first Wound Open Wounding Event: Gradually Appeared Status: Date Acquired: 05/20/2023 Comorbid Lymphedema, Chronic Obstructive Pulmonary Disease (COPD), Weeks Of Treatment: 1 History: Sleep Apnea, Deep Vein Thrombosis, Peripheral Arterial Disease, Clustered Wound: No Osteoarthritis, Neuropathy Photos Wound Measurements Length: (cm) 0.7 Width: (cm) 0.6 Depth: (cm) 0.1 Area: (cm) 0.33 Volume: (cm) 0.033 % Reduction in Area: 25% % Reduction in Volume: 62.5% Epithelialization: Small (1-33%) Tunneling: No Undermining: No Wound Description Classification: Full Thickness Without Exposed Support Structures Wound Margin: Distinct, outline attached Exudate Amount: Medium Eduardo [name], Eduardo [name] ([id number]) Exudate Type: Serosanguineous Exudate Color: red, brown Foul Odor After Cleansing: No Slough/Fibrino Yes [phone].pdf Page 8 of 10 Wound Bed Granulation Amount: Large (67-100%) Exposed Structure Granulation Quality: Red, Pink Fascia Exposed: No Necrotic Amount: Small (1-33%) Fat Layer (Subcutaneous Tissue) Exposed: Yes Necrotic Quality: Adherent Slough Tendon Exposed: No Muscle Exposed: No Joint Exposed: No Bone Exposed: No Periwound Skin Texture Texture Color No Abnormalities Noted: No No Abnormalities Noted: Yes Callus: Yes Temperature / Pain Temperature: No Abnormality Moisture No Abnormalities Noted: Yes Treatment Notes Wound #2 (Metatarsal head first) Wound Laterality: Left Cleanser Soap and Water Discharge Instruction: May shower and wash wound with dial antibacterial soap and water prior to dressing change. Wound Cleanser Discharge Instruction: Cleanse the wound with wound cleanser prior to applying a clean dressing using gauze sponges, not tissue or  cotton balls. Peri-Wound Care Topical Primary Dressing Maxorb Extra Ag+ Alginate Dressing, 2x2 (in/in) Discharge Instruction: Apply to wound bed as instructed Secondary Dressing Woven Gauze Sponge, Non-Sterile 4x4 in Discharge Instruction: Apply over primary dressing as directed. Secured With Conforming Stretch Gauze Bandage, Sterile 2x75 (in/in) Discharge Instruction: Secure with stretch gauze as directed. Compression Wrap Compression Stockings Add-Ons Electronic Signature(s) Signed: 07/30/2023 11:32:03 AM By: Samuella Bruin Entered By: Samuella Bruin on 07/30/2023 06:34:28 -------------------------------------------------------------------------------- Wound Assessment Details Patient Name: Date of Service: Eduardo Ruder W. 07/30/2023 9:00 A M Medical Record Number: [id number] Patient Account Number: [id number] Date of Birth/Sex: Treating RN: 04-15-1971 (52 y.o. Eduardo [name] Primary Care Lambert Jeanty: Saralyn Pilar Other Clinician: Referring Amiah Frohlich: Treating Kenyette Gundy/Extender: Harrie Jeans Weeks in Treatment: 1 Wound Status Wound Number: 3 Primary Neuropathic Ulcer-Non Diabetic Etiology: Wound Location: Right T - Web between 1st and 2nd Eduardo [name], Eduardo [name] ([id number]) [phone].pdf Page 9 of 10 Wound Open Wounding Event: Gradually Appeared Status: Date Acquired: 07/29/2023 Comorbid Lymphedema, Chronic Obstructive Pulmonary Disease (COPD), Weeks Of Treatment: 0 History: Sleep Apnea, Deep Vein Thrombosis, Peripheral Arterial Disease,  Clustered Wound: No Osteoarthritis, Neuropathy Photos Wound Measurements Length: (cm) 0.5 Width: (cm) 0.2 Depth: (cm) 0.1 Area: (cm) 0.079 Volume: (cm) 0.008 % Reduction in Area: % Reduction in Volume: Epithelialization: Small (1-33%) Tunneling: No Undermining: No Wound Description Classification: Full Thickness Without Exposed Support Structures Wound Margin:  Distinct, outline attached Exudate Amount: Medium Exudate Type: Serosanguineous Exudate Color: red, brown Foul Odor After Cleansing: No Slough/Fibrino No Wound Bed Granulation Amount: Large (67-100%) Exposed Structure Granulation Quality: Red Fascia Exposed: No Necrotic Amount: None Present (0%) Fat Layer (Subcutaneous Tissue) Exposed: Yes Tendon Exposed: No Muscle Exposed: No Joint Exposed: No Bone Exposed: No Periwound Skin Texture Texture Color No Abnormalities Noted: No No Abnormalities Noted: Yes Callus: Yes Temperature / Pain Temperature: No Abnormality Moisture No Abnormalities Noted: Yes Treatment Notes Wound #3 (Toe - Web between 1st and 2nd) Wound Laterality: Right Cleanser Soap and Water Discharge Instruction: May shower and wash wound with dial antibacterial soap and water prior to dressing change. Wound Cleanser Discharge Instruction: Cleanse the wound with wound cleanser prior to applying a clean dressing using gauze sponges, not tissue or cotton balls. Peri-Wound Care Topical Primary Dressing Maxorb Extra Ag+ Alginate Dressing, 2x2 (in/in) Discharge Instruction: Apply to wound bed as instructed Secondary Dressing Woven Gauze Sponge, Non-Sterile 4x4 in Discharge Instruction: Apply over primary dressing as directed. Secured With [employer], Sterile 2x75 (in/in) Eduardo [name], Eduardo [name] ([id number]) [id number] Page 10 of 10 Discharge Instruction: Secure with stretch gauze as directed. Compression Wrap Compression Stockings Add-Ons Electronic Signature(s) Signed: 07/30/2023 11:32:03 AM By: Samuella Bruin Entered By: Samuella Bruin on 07/30/2023 06:34:53 -------------------------------------------------------------------------------- Vitals Details Patient Name: Date of Service: Eduardo Ruder W. 07/30/2023 9:00 A M Medical Record Number: [id number] Patient Account Number: [id number] Date of  Birth/Sex: Treating RN: 03-02-1971 (52 y.o. Eduardo [name] Primary Care Sherica Paternostro: Saralyn Pilar Other Clinician: Referring Makye Radle: Treating Dale Strausser/Extender: Harrie Jeans Weeks in Treatment: 1 Vital Signs Time Taken: 09:26 Temperature (F): 97.6 Height (in): 77 Pulse (bpm): 85 Weight (lbs): 330 Respiratory Rate (breaths/min): 16 Body Mass Index (BMI): 39.1 Blood Pressure (mmHg): 174/93 Reference Range: 80 - 120 mg / dl Electronic Signature(s) Signed: 07/30/2023 11:32:03 AM By: Samuella Bruin Entered By: Samuella Bruin on 07/30/2023 06:27:00

## 2023-07-30 NOTE — Telephone Encounter (Signed)
"  Monster stories"?  Did he specify any particular concerns?

## 2023-07-30 NOTE — Progress Notes (Signed)
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 bleeding was controlled with Pressure. The procedure was tolerated well with a pain level of 0 throughout and a pain level of 0 following the procedure. Post Debridement Measurements: 0.5cm length x 0.2cm width x 0.1cm depth; 0.008cm^3 volume. Character of Wound/Ulcer Post Debridement is improved. Post procedure Diagnosis Wound #3: Same as  Pre-Procedure Plan Follow-up Appointments: Return Appointment in 1 week. - Dr. Lady Gary - room 2 Anesthetic: (In clinic) Topical Lidocaine 4% applied to wound bed Bathing/ Shower/ Hygiene: May shower and wash wound with soap and water. - with dressing changes ANWAR, SAKATA ([id number]) [phone].pdf Page 9 of 11 Edema Control - Lymphedema / SCD / Other: Elevate legs to the level of the heart or above for 30 minutes daily and/or when sitting for 3-4 times a day throughout the day. Avoid standing for long periods  of time. Off-Loading: Open toe surgical shoe to: - to both feet with peg assist inserts Additional Orders / Instructions: Stop/Decrease Smoking WOUND #1: - Metatarsal head second Wound Laterality: Right Cleanser: Soap and Water 1 x Per Day/30 Days Discharge Instructions: May shower and wash wound with dial antibacterial soap and water prior to dressing change. Cleanser: Wound Cleanser 1 x Per Day/30 Days Discharge Instructions: Cleanse the wound with wound cleanser prior to applying a clean dressing using gauze sponges, not tissue or cotton balls. Prim Dressing: Maxorb Extra Ag+ Alginate Dressing, 2x2 (in/in) 1 x Per Day/30 Days ary Discharge Instructions: Apply to wound bed as instructed Secondary Dressing: Woven Gauze Sponge, Non-Sterile 4x4 in 1 x Per Day/30 Days Discharge Instructions: Apply over primary dressing as directed. Secured With: Insurance underwriter, Sterile 2x75 (in/in) 1 x Per Day/30 Days Discharge Instructions: Secure with stretch gauze as directed. WOUND #2: - Metatarsal head first Wound Laterality: Left Cleanser: Soap and Water 1 x Per Day/30 Days Discharge Instructions: May shower and wash wound with dial antibacterial soap and water prior to dressing change. Cleanser: Wound Cleanser 1 x Per Day/30 Days Discharge Instructions: Cleanse the wound with wound cleanser prior to applying a clean dressing using gauze  sponges, not tissue or cotton balls. Prim Dressing: Maxorb Extra Ag+ Alginate Dressing, 2x2 (in/in) 1 x Per Day/30 Days ary Discharge Instructions: Apply to wound bed as instructed Secondary Dressing: Woven Gauze Sponge, Non-Sterile 4x4 in 1 x Per Day/30 Days Discharge Instructions: Apply over primary dressing as directed. Secured With: Insurance underwriter, Sterile 2x75 (in/in) 1 x Per Day/30 Days Discharge Instructions: Secure with stretch gauze as directed. WOUND #3: - T - Web between 1st and 2nd Wound Laterality: Right oe Cleanser: Soap and Water 1 x Per Day/30 Days Discharge Instructions: May shower and wash wound with dial antibacterial soap and water prior to dressing change. Cleanser: Wound Cleanser 1 x Per Day/30 Days Discharge Instructions: Cleanse the wound with wound cleanser prior to applying a clean dressing using gauze sponges, not tissue or cotton balls. Prim Dressing: Maxorb Extra Ag+ Alginate Dressing, 2x2 (in/in) 1 x Per Day/30 Days ary Discharge Instructions: Apply to wound bed as instructed Secondary Dressing: Woven Gauze Sponge, Non-Sterile 4x4 in 1 x Per Day/30 Days Discharge Instructions: Apply over primary dressing as directed. Secured With: Insurance underwriter, Sterile 2x75 (in/in) 1 x Per Day/30 Days Discharge Instructions: Secure with stretch gauze as directed. 07/30/2023: Both plantar ulcers are smaller today. There is some senescent skin around the edges of each with some accumulated callus present. The crack at his right first toe with has opened into a wound. I used a curette to debride callus, slough, and senescent skin from his wounds. We will continue to use silver alginate and peg assist offloading inserts. Follow-up in 1 week. Electronic Signature(s) Signed: 07/30/2023 9:55:52 AM By: Duanne Guess MD FACS Entered By: Duanne Guess on 07/30/2023  06:55:52 -------------------------------------------------------------------------------- HxROS Details Patient Name: Date of Service: [name] 07/30/2023 9:00 A M Medical Record Number: [id number] Patient Account Number: [id number] Date of Birth/Sex: Treating RN: June 03, 1971 (52 y.o. M) Primary Care Provider: Saralyn Pilar Other Clinician: Referring Provider: Treating Provider/Extender: Park Liter in Treatment: 1 Information Obtained From Patient Chart Constitutional Symptoms (General Health) Medical History: Past Medical History Notes: MRSA Hematologic/Lymphatic SIVAN, QUAST ([id number]) [phone].pdf Page 10 of 11 Medical History: Positive for: Lymphedema Respiratory Medical History: Positive for: Chronic Obstructive Pulmonary Disease (  COPD); Sleep Apnea Past Medical History Notes: Pulmonary embolus Cardiovascular Medical History: Positive for: Deep Vein Thrombosis; Peripheral Arterial Disease Negative for: Hypertension Gastrointestinal Medical History: Past Medical History Notes: GERD (gastroesophageal reflux disease) Endocrine Medical History: Negative for: Type I Diabetes; Type II Diabetes Integumentary (Skin) Medical History: Past Medical History Notes: Pruritus Pilonidal cyst stasis dermatitis Musculoskeletal Medical History: Positive for: Osteoarthritis Past Medical History Notes: Chronic back pain Degenerative disk disease Neurologic Medical History: Positive for: Neuropathy Psychiatric Medical History: Past Medical History Notes: Anxiety Depression opiate dependence benzodiazepine dependence Immunizations Pneumococcal Vaccine: Received Pneumococcal Vaccination: No Implantable Devices None Hospitalization / Surgery History Type of Hospitalization/Surgery Mass excision (Right) T arthroplasty (Right) oe Nasal septoplasty w/ turbinoplasty Sinus endo with  fusion Wrist fusion Thoracic outlet surgery Irrigation and debridement of back abscess Family and Social History Cancer: Yes - Siblings; Diabetes: Yes - Mother; Heart Disease: Yes - Father; Hereditary Spherocytosis: No; Hypertension: Yes - Father; Kidney Disease: No; Lung Disease: No; Seizures: No; Stroke: No; Thyroid Problems: Yes - Mother; Tuberculosis: No; Current every day smoker - 1 ppd; Marital Status - Divorced; Alcohol Use: Rarely; Drug Use: Prior History; Caffeine Use: Daily; Financial Concerns: No; Food, Clothing or Shelter Needs: No; Support System MOHAMMED, MCANDREW ([id number]) [id number] Page 11 of 11 Lacking: No; Transportation Concerns: No Electronic Signature(s) Signed: 07/30/2023 10:43:13 AM By: Duanne Guess MD FACS Entered By: Duanne Guess on 07/30/2023 06:53:54 -------------------------------------------------------------------------------- SuperBill Details Patient Name: Date of Service: Eduardo [name] 07/30/2023 Medical Record Number: [id number] Patient Account Number: [id number] Date of Birth/Sex: Treating RN: [birth year] (52 y.o. M) Primary Care Provider: Saralyn Pilar Other Clinician: Referring Provider: Treating Provider/Extender: Harrie Jeans Weeks in Treatment: 1 Diagnosis Coding ICD-10 Codes Code Description [phone] Non-pressure chronic ulcer of other part of right foot with fat layer exposed L97.522 Non-pressure chronic ulcer of other part of left foot with fat layer exposed G90.09 Other idiopathic peripheral autonomic neuropathy Z72.0 Tobacco use E66.01 Morbid (severe) obesity due to excess calories Facility Procedures : CPT4 Code: 46962952 Description: 97597 - DEBRIDE WOUND 1ST 20 SQ CM OR < ICD-10 Diagnosis Description L97.512 Non-pressure chronic ulcer of other part of right foot with fat layer exposed L97.522 Non-pressure chronic ulcer of other part of left foot with fat layer  exposed Modifier: Quantity: 1 Physician Procedures : CPT4 Code Description Modifier [id number] - WC PHYS LEVEL 3 - EST PT 25 ICD-10 Diagnosis Description L97.512 Non-pressure chronic ulcer of other part of right foot with fat layer exposed L97.522 Non-pressure chronic ulcer of other part of left foot  with fat layer exposed G90.09 Other idiopathic peripheral autonomic neuropathy Z72.0 Tobacco use Quantity: 1 : [id number] - WC PHYS DEBR WO ANESTH 20 SQ CM ICD-10 Diagnosis Description L97.512 Non-pressure chronic ulcer of other part of right foot with fat layer exposed L97.522 Non-pressure chronic ulcer of other part of left foot with fat layer exposed Quantity: 1 Electronic Signature(s) Signed: 07/30/2023 9:56:12 AM By: Duanne Guess MD FACS Entered By: Duanne Guess on 07/30/2023 06:56:11

## 2023-08-02 ENCOUNTER — Encounter: Payer: Self-pay | Admitting: Family Medicine

## 2023-08-06 ENCOUNTER — Encounter (HOSPITAL_BASED_OUTPATIENT_CLINIC_OR_DEPARTMENT_OTHER): Payer: Medicaid Other | Admitting: General Surgery

## 2023-08-06 DIAGNOSIS — L97512 Non-pressure chronic ulcer of other part of right foot with fat layer exposed: Secondary | ICD-10-CM | POA: Diagnosis not present

## 2023-08-06 NOTE — Progress Notes (Signed)
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 Character of Wound/Ulcer Post Debridement is improved. 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 There was a  Selective/Open oe Wound Non-Viable Tissue Debridement with a total area of 0.28 sq cm performed by Duanne Guess, MD. With the following instrument(s): Curette to remove Non-Viable tissue/material. Material removed includes Callus after achieving pain control using Lidocaine 4% Topical Solution. No specimens were taken. A time out was conducted at 08:56, prior to the start of the procedure. A Minimum amount of bleeding was controlled with Pressure. The procedure was tolerated well. Post Debridement Measurements: 1.8cm length x 0.2cm width x 0.1cm depth; 0.028cm^3 volume. Character of Wound/Ulcer Post Debridement is improved. Post procedure Diagnosis Wound #3: Same as Pre-Procedure Plan Jinkins,  Carlis Stable ([id number]) [id number] Page 9 of 11 Follow-up Appointments: Return Appointment in 1 week. - Dr. Lady Gary - room 2 Anesthetic: (In clinic) Topical Lidocaine 4% applied to wound bed Bathing/ Shower/ Hygiene: May shower and wash wound with soap and water. - with dressing changes Edema Control - Lymphedema / SCD / Other: Elevate legs to the level of the heart or above for 30 minutes daily and/or when sitting for 3-4 times a day throughout the day. Avoid standing for long periods of time. Off-Loading: Open toe surgical shoe to: - to both feet with peg assist inserts Additional Orders / Instructions: Stop/Decrease Smoking The following medication(s) was prescribed: lidocaine topical 4 % cream cream topical was prescribed at facility WOUND #1: - Metatarsal head second Wound Laterality: Right Cleanser: Soap and Water 1 x Per Day/30 Days Discharge Instructions: May shower and wash wound with dial antibacterial soap and water prior to dressing change. Cleanser: Wound Cleanser 1 x Per Day/30 Days Discharge Instructions: Cleanse the wound with wound cleanser prior to applying a clean dressing using gauze sponges, not tissue or cotton balls. Prim Dressing: Maxorb Extra  Ag+ Alginate Dressing, 2x2 (in/in) 1 x Per Day/30 Days ary Discharge Instructions: Apply to wound bed as instructed Secondary Dressing: Woven Gauze Sponge, Non-Sterile 4x4 in 1 x Per Day/30 Days Discharge Instructions: Apply over primary dressing as directed. Secured With: Insurance underwriter, Sterile 2x75 (in/in) 1 x Per Day/30 Days Discharge Instructions: Secure with stretch gauze as directed. WOUND #2: - Metatarsal head first Wound Laterality: Left Cleanser: Soap and Water 1 x Per Day/30 Days Discharge Instructions: May shower and wash wound with dial antibacterial soap and water prior to dressing change. Cleanser: Wound Cleanser 1 x Per Day/30 Days Discharge Instructions: Cleanse the wound with wound cleanser prior to applying a clean dressing using gauze sponges, not tissue or cotton balls. Prim Dressing: Maxorb Extra Ag+ Alginate Dressing, 2x2 (in/in) 1 x Per Day/30 Days ary Discharge Instructions: Apply to wound bed as instructed Secondary Dressing: Woven Gauze Sponge, Non-Sterile 4x4 in 1 x Per Day/30 Days Discharge Instructions: Apply over primary dressing as directed. Secured With: Insurance underwriter, Sterile 2x75 (in/in) 1 x Per Day/30 Days Discharge Instructions: Secure with stretch gauze as directed. WOUND #3: - T - Web between 1st and 2nd Wound Laterality: Right oe Cleanser: Soap and Water 1 x Per Day/30 Days Discharge Instructions: May shower and wash wound with dial antibacterial soap and water prior to dressing change. Cleanser: Wound Cleanser 1 x Per Day/30 Days Discharge Instructions: Cleanse the wound with wound cleanser prior to applying a clean dressing using gauze sponges, not tissue or cotton balls. Prim Dressing: Maxorb Extra Ag+ Alginate Dressing, 2x2 (in/in) 1 x Per Day/30 Days ary Discharge Instructions: Apply to wound bed as instructed Secondary Dressing: Woven Gauze Sponge, Non-Sterile 4x4 in 1 x Per Day/30 Days Discharge  Instructions: Apply over primary dressing as directed. Secured With: Insurance underwriter, Sterile 2x75 (in/in) 1 x Per Day/30 Days Discharge Instructions: Secure with stretch gauze as directed. 08/06/2023: The plantar ulcers are little bit smaller today. He has built up callus around each of them and there is thin slough on the surfaces. The crack between his first and second toes is basically stable with substantial callus accumulation along each margin. No concern for infection. I used a curette to debride callus and slough from the left wound and callus skin and slough from the right plantar wound. I debrided callus from the wound  between his first and second toes. We will continue silver alginate to all sites. Continue leg elevation and offload as much as possible. Follow-up in 1 week. Electronic Signature(s) Signed: 08/06/2023 9:36:10 AM By: Duanne Guess MD FACS Entered By: Duanne Guess on 08/06/2023 06:36:10 -------------------------------------------------------------------------------- HxROS Details Patient Name: Date of Service: [name] 08/06/2023 9:00 A M Medical Record Number: [id number] Patient Account Number: [id number] Date of Birth/Sex: Treating RN: May 28, 1971 (52 y.o. M) Primary Care Provider: Saralyn Pilar Other Clinician: Referring Provider: Treating Provider/Extender: Park Liter in Treatment: 2 Information Obtained From Patient Chart ELIUS, CHIPLEY ([id number]) [id number] Page 10 of 11 Constitutional Symptoms (General Health) Medical History: Past Medical History Notes: MRSA Hematologic/Lymphatic Medical History: Positive for: Lymphedema Respiratory Medical History: Positive for: Chronic Obstructive Pulmonary Disease (COPD); Sleep Apnea Past Medical History Notes: Pulmonary embolus Cardiovascular Medical History: Positive for: Deep Vein Thrombosis; Peripheral  Arterial Disease Negative for: Hypertension Gastrointestinal Medical History: Past Medical History Notes: GERD (gastroesophageal reflux disease) Endocrine Medical History: Negative for: Type I Diabetes; Type II Diabetes Integumentary (Skin) Medical History: Past Medical History Notes: Pruritus Pilonidal cyst stasis dermatitis Musculoskeletal Medical History: Positive for: Osteoarthritis Past Medical History Notes: Chronic back pain Degenerative disk disease Neurologic Medical History: Positive for: Neuropathy Psychiatric Medical History: Past Medical History Notes: Anxiety Depression opiate dependence benzodiazepine dependence Immunizations Pneumococcal Vaccine: Received Pneumococcal Vaccination: No Implantable Devices None Hospitalization / Surgery History Type of Hospitalization/Surgery Mass excision (Right) T arthroplasty (Right) oe Nasal septoplasty w/ turbinoplasty Sinus endo with fusion Wrist fusion [name] ([id number]) [id number] Page 11 of 11 Thoracic outlet surgery Irrigation and debridement of back abscess Family and Social History Cancer: Yes - Siblings; Diabetes: Yes - Mother; Heart Disease: Yes - Father; Hereditary Spherocytosis: No; Hypertension: Yes - Father; Kidney Disease: No; Lung Disease: No; Seizures: No; Stroke: No; Thyroid Problems: Yes - Mother; Tuberculosis: No; Current every day smoker - 1 ppd; Marital Status - Divorced; Alcohol Use: Rarely; Drug Use: Prior History; Caffeine Use: Daily; Financial Concerns: No; Food, Clothing or Shelter Needs: No; Support System Lacking: No; Transportation Concerns: No Electronic Signature(s) Signed: 08/06/2023 12:31:34 PM By: Duanne Guess MD FACS Entered By: Duanne Guess on 08/06/2023 06:34:28 -------------------------------------------------------------------------------- SuperBill Details Patient Name: Date of Service: Eduardo [name]  08/06/2023 Medical Record Number: [id number] Patient Account Number: [id number] Date of Birth/Sex: Treating RN: 05-07-1971 (52 y.o. M) Primary Care Provider: Saralyn Pilar Other Clinician: Referring Provider: Treating Provider/Extender: Harrie Jeans Weeks in Treatment: 2 Diagnosis Coding ICD-10 Codes Code Description [phone] Non-pressure chronic ulcer of other part of right foot with fat layer exposed L97.522 Non-pressure chronic ulcer of other part of left foot with fat layer exposed G90.09 Other idiopathic peripheral autonomic neuropathy Z72.0 Tobacco use E66.01 Morbid (severe) obesity due to excess calories Facility Procedures : CPT4 Code: 41324401 Description: 97597 - DEBRIDE WOUND 1ST 20 SQ CM OR < ICD-10 Diagnosis Description L97.512 Non-pressure chronic ulcer of other part of right foot with fat layer exposed L97.522 Non-pressure chronic ulcer of other part of left foot with fat layer exposed Modifier: Quantity: 1 Physician Procedures : CPT4 Code Description Modifier [id number] - WC PHYS LEVEL 3 - EST PT 25 ICD-10 Diagnosis Description L97.512 Non-pressure chronic ulcer of other part of right foot with fat layer exposed L97.522 Non-pressure chronic ulcer of other part of left foot  with fat layer exposed G90.09 Other idiopathic peripheral autonomic neuropathy Z72.0 Tobacco use Quantity: 1 : [id number]  97597 - WC PHYS DEBR WO ANESTH 20 SQ CM ICD-10 Diagnosis Description L97.512 Non-pressure chronic ulcer of other part of right foot with fat layer exposed L97.522 Non-pressure chronic ulcer of other part of left foot with fat layer exposed Quantity: 1 Electronic Signature(s) Signed: 08/06/2023 9:36:29 AM By: Duanne Guess MD FACS Entered By: Duanne Guess on 08/06/2023 93:26:71

## 2023-08-06 NOTE — Progress Notes (Signed)
MAYSIN, MOEHRING ([id number]) [id number] Page 1 of 11 Visit Report for 08/06/2023 Arrival Information Details Patient Name: Date of Service: Eduardo [name] 08/06/2023 9:00 A M Medical Record Number: [id number] Patient Account Number: [id number] Date of Birth/Sex: Treating RN: Nov 19, 1970 (52 y.o. [name] Primary Care Keean Wilmeth: Saralyn Pilar Other Clinician: Referring Venice Marcucci: Treating Danine Hor/Extender: Harrie Jeans Weeks in Treatment: 2 Visit Information History Since Last Visit Added or deleted any medications: No Patient Arrived: Ambulatory Any new allergies or adverse reactions: No Arrival Time: 08:37 Had a fall or experienced change in No Accompanied By: self activities of daily living that may affect Transfer Assistance: None risk of falls: Patient Identification Verified: Yes Signs or symptoms of abuse/neglect since last visito No Secondary Verification Process Completed: Yes Hospitalized since last visit: No Patient Has Alerts: Yes Implantable device outside of the clinic excluding No Patient Alerts: ABIs: R:1.11 L:1.18 9/24 cellular tissue based products placed in the center TBIs: R:1.13 L:1.02 9/24 since last visit: Has Dressing in Place as Prescribed: Yes Pain Present Now: Yes Electronic Signature(s) Signed: 08/06/2023 11:28:30 AM By: Samuella Bruin Entered By: Samuella Bruin on 08/06/2023 05:37:47 -------------------------------------------------------------------------------- Encounter Discharge Information Details Patient Name: Date of Service: Eduardo Ruder W. 08/06/2023 9:00 A M Medical Record Number: [id number] Patient Account Number: [id number] Date of Birth/Sex: Treating RN: 04/25/1971 (52 y.o. [name] Primary Care Ziomara Birenbaum: Saralyn Pilar Other Clinician: Referring Trayce Caravello: Treating Shikha Bibb/Extender: Park Liter in  Treatment: 2 Encounter Discharge Information Items Post Procedure Vitals Discharge Condition: Stable Temperature (F): 97.8 Ambulatory Status: Ambulatory Pulse (bpm): 89 Discharge Destination: Home Respiratory Rate (breaths/min): 16 Transportation: Private Auto Blood Pressure (mmHg): 156/93 Accompanied By: self Schedule Follow-up Appointment: Yes Clinical Summary of Care: Patient Declined Electronic Signature(s) Signed: 08/06/2023 11:28:30 AM By: Samuella Bruin Entered By: Samuella Bruin on 08/06/2023 06:06:32 Eduardo [name] ([id number]) [id number] Page 2 of 11 -------------------------------------------------------------------------------- Lower Extremity Assessment Details Patient Name: Date of Service: Eduardo [name] 08/06/2023 9:00 A M Medical Record Number: [id number] Patient Account Number: [id number] Date of Birth/Sex: Treating RN: [birth year] (52 y.o. [name] Primary Care Gabriella Woodhead: Saralyn Pilar Other Clinician: Referring Manolo Bosket: Treating Jakita Dutkiewicz/Extender: Harrie Jeans Weeks in Treatment: 2 Edema Assessment Assessed: Eduardo [name]: No] Franne Forts: No] [Left: Edema] [Right: :] Calf Left: Right: Point of Measurement: From Medial Instep 48.5 cm 49.4 cm Ankle Left: Right: Point of Measurement: From Medial Instep 29.5 cm 30 cm Vascular Assessment Extremity colors, hair growth, and conditions: Extremity Color: [Left:Hyperpigmented] [Right:Hyperpigmented] Hair Growth on Extremity: [Left:Yes] [Right:Yes] Temperature of Extremity: [Left:Warm] [Right:Warm] Capillary Refill: [Left:< 3 seconds] [Right:< 3 seconds] Dependent Rubor: [Left:No No] [Right:No No] Electronic Signature(s) Signed: 08/06/2023 11:28:30 AM By: Samuella Bruin Entered By: Samuella Bruin on 08/06/2023 05:38:29 -------------------------------------------------------------------------------- Multi Wound Chart Details Patient  Name: Date of Service: Eduardo Ruder W. 08/06/2023 9:00 A M Medical Record Number: [id number] Patient Account Number: [id number] Date of Birth/Sex: Treating RN: 1971-08-09 (52 y.o. M) Primary Care Jaydalee Bardwell: Saralyn Pilar Other Clinician: Referring Jarnell Cordaro: Treating Miachel Nardelli/Extender: Harrie Jeans Weeks in Treatment: 2 Vital Signs Height(in): 77 Pulse(bpm): 89 Weight(lbs): 330 Blood Pressure(mmHg): 156/93 Body Mass Index(BMI): 39.1 Temperature(F): 97.8 Respiratory Rate(breaths/min): 16 [1:Photos:] [3:[id number] Page 3 of 11] Right Metatarsal head second Left Metatarsal head first Right T - Web between 1st and 2nd oe Wound Location: Thermal Burn Gradually Appeared Gradually Appeared Wounding Event: Neuropathic Ulcer-Non Diabetic Neuropathic Ulcer-Non Diabetic Neuropathic  Ulcer-Non Diabetic Primary Etiology: Lymphedema, Chronic Obstructive Lymphedema, Chronic Obstructive Lymphedema, Chronic Obstructive Comorbid History: Pulmonary Disease (COPD), Sleep Pulmonary Disease (COPD), Sleep Pulmonary Disease (COPD), Sleep Apnea, Deep Vein Thrombosis, Apnea, Deep Vein Thrombosis, Apnea, Deep Vein Thrombosis, Peripheral Arterial Disease, Peripheral Arterial Disease, Peripheral Arterial Disease, Osteoarthritis, Neuropathy Osteoarthritis, Neuropathy Osteoarthritis, Neuropathy 04/19/2023 05/20/2023 07/29/2023 Date Acquired: 2 2 1  Weeks of Treatment: Open Open Open Wound Status: No No No Wound Recurrence: 1.4x1.1x0.2 0.5x0.5x0.1 1.8x0.2x0.1 Measurements L x W x D (cm) 1.21 0.196 0.283 A (cm) : rea 0.242 0.02 0.028 Volume (cm) : 44.20% 55.50% -258.20% % Reduction in A rea: 44.20% 77.30% -250.00% % Reduction in Volume: Full Thickness Without Exposed Full Thickness Without Exposed Full Thickness Without Exposed Classification: Support Structures Support Structures Support Structures Medium Medium Medium Exudate A  mount: Serosanguineous Serosanguineous Serosanguineous Exudate Type: red, brown red, brown red, brown Exudate Color: Distinct, outline attached Distinct, outline attached Distinct, outline attached Wound Margin: Large (67-100%) Large (67-100%) Large (67-100%) Granulation A mount: Red Red, Pink Red Granulation Quality: Small (1-33%) Small (1-33%) Small (1-33%) Necrotic A mount: Fat Layer (Subcutaneous Tissue): Yes Fat Layer (Subcutaneous Tissue): Yes Fat Layer (Subcutaneous Tissue): Yes Exposed Structures: Fascia: No Fascia: No Fascia: No Tendon: No Tendon: No Tendon: No Muscle: No Muscle: No Muscle: No Joint: No Joint: No Joint: No Bone: No Bone: No Bone: No Small (1-33%) Small (1-33%) Small (1-33%) Epithelialization: Debridement - Selective/Open Wound Debridement - Selective/Open Wound Debridement - Selective/Open Wound Debridement: Pre-procedure Verification/Time Out 08:56 08:56 08:56 Taken: Lidocaine 4% T opical Solution Lidocaine 4% T opical Solution Lidocaine 4% Topical Solution Pain Control: Callus, Slough Callus, Slough Callus Tissue Debrided: Skin/Epidermis Non-Viable Tissue Non-Viable Tissue Level: 1.21 0.2 0.28 Debridement A (sq cm): rea Curette Curette Curette Instrument: Minimum Minimum Minimum Bleeding: Pressure Pressure Pressure Hemostasis A chieved: Procedure was tolerated well Procedure was tolerated well Procedure was tolerated well Debridement Treatment Response: 1.4x1.1x0.2 0.5x0.5x0.1 1.8x0.2x0.1 Post Debridement Measurements L x W x D (cm) 0.242 0.02 0.028 Post Debridement Volume: (cm) Callus: Yes Callus: Yes Callus: Yes Periwound Skin Texture: No Abnormalities Noted No Abnormalities Noted No Abnormalities Noted Periwound Skin Moisture: No Abnormalities Noted No Abnormalities Noted No Abnormalities Noted Periwound Skin Color: No Abnormality No Abnormality No Abnormality Temperature: Debridement Debridement  Debridement Procedures Performed: Treatment Notes Wound #1 (Metatarsal head second) Wound Laterality: Right Cleanser Soap and Water Discharge Instruction: May shower and wash wound with dial antibacterial soap and water prior to dressing change. Wound Cleanser Discharge Instruction: Cleanse the wound with wound cleanser prior to applying a clean dressing using gauze sponges, not tissue or cotton balls. Peri-Wound Care Topical Primary Dressing Maxorb Extra Ag+ Alginate Dressing, 2x2 (in/in) Discharge Instruction: Apply to wound bed as instructed Secondary Dressing Woven Gauze Sponge, Non-Sterile 4x4 in Discharge Instruction: Apply over primary dressing as directed. Secured With [employer], Sterile 2x75 (in/in) Eduardo [name], Eduardo [name] ([id number]) [id number] Page 4 of 11 Discharge Instruction: Secure with stretch gauze as directed. Compression Wrap Compression Stockings Add-Ons Wound #2 (Metatarsal head first) Wound Laterality: Left Cleanser Soap and Water Discharge Instruction: May shower and wash wound with dial antibacterial soap and water prior to dressing change. Wound Cleanser Discharge Instruction: Cleanse the wound with wound cleanser prior to applying a clean dressing using gauze sponges, not tissue or cotton balls. Peri-Wound Care Topical Primary Dressing Maxorb Extra Ag+ Alginate Dressing, 2x2 (in/in) Discharge Instruction: Apply to wound bed as instructed Secondary Dressing Woven Gauze Sponge, Non-Sterile 4x4 in Discharge Instruction: Apply over primary  dressing as directed. Secured With Conforming Stretch Gauze Bandage, Sterile 2x75 (in/in) Discharge Instruction: Secure with stretch gauze as directed. Compression Wrap Compression Stockings Add-Ons Wound #3 (Toe - Web between 1st and 2nd) Wound Laterality: Right Cleanser Soap and Water Discharge Instruction: May shower and wash wound with dial antibacterial soap and  water prior to dressing change. Wound Cleanser Discharge Instruction: Cleanse the wound with wound cleanser prior to applying a clean dressing using gauze sponges, not tissue or cotton balls. Peri-Wound Care Topical Primary Dressing Maxorb Extra Ag+ Alginate Dressing, 2x2 (in/in) Discharge Instruction: Apply to wound bed as instructed Secondary Dressing Woven Gauze Sponge, Non-Sterile 4x4 in Discharge Instruction: Apply over primary dressing as directed. Secured With Conforming Stretch Gauze Bandage, Sterile 2x75 (in/in) Discharge Instruction: Secure with stretch gauze as directed. Compression Wrap Compression Stockings Add-Ons Electronic Signature(s) Signed: 08/06/2023 9:31:59 AM By: Duanne Guess MD FACS Entered By: Duanne Guess on 08/06/2023 06:31:59 Eduardo [name] ([id number]) [id number] Page 5 of 11 -------------------------------------------------------------------------------- Multi-Disciplinary Care Plan Details Patient Name: Date of Service: Eduardo [name] 08/06/2023 9:00 A M Medical Record Number: [id number] Patient Account Number: [id number] Date of Birth/Sex: Treating RN: 1971/07/18 (52 y.o. [name] Primary Care Lorry Furber: Saralyn Pilar Other Clinician: Referring Joziyah Roblero: Treating Calie Buttrey/Extender: Park Liter in Treatment: 2 Multidisciplinary Care Plan reviewed with physician Active Inactive Peripheral Neuropathy Nursing Diagnoses: Knowledge deficit related to disease process and management of peripheral neurovascular dysfunction Potential alteration in peripheral tissue perfusion (select prior to confirmation of diagnosis) Goals: Patient/caregiver will verbalize understanding of disease process and disease management Date Initiated: 07/22/2023 Target Resolution Date: 09/24/2023 Goal Status: Active Interventions: Assess signs and symptoms of neuropathy upon admission and  as needed Provide education on Management of Neuropathy and Related Ulcers Notes: Wound/Skin Impairment Nursing Diagnoses: Impaired tissue integrity Knowledge deficit related to ulceration/compromised skin integrity Goals: Patient will demonstrate a reduced rate of smoking or cessation of smoking Date Initiated: 07/22/2023 Target Resolution Date: 09/24/2023 Goal Status: Active Patient/caregiver will verbalize understanding of skin care regimen Date Initiated: 07/22/2023 Target Resolution Date: 09/24/2023 Goal Status: Active Interventions: Assess patient/caregiver ability to obtain necessary supplies Assess patient/caregiver ability to perform ulcer/skin care regimen upon admission and as needed Assess ulceration(s) every visit Provide education on smoking Treatment Activities: Referred to DME [place] Doughman for dressing supplies : 07/22/2023 Skin care regimen initiated : 07/22/2023 Topical wound management initiated : 07/22/2023 Notes: Electronic Signature(s) Signed: 08/06/2023 11:28:30 AM By: Samuella Bruin Entered By: Samuella Bruin on 08/06/2023 05:48:00 Eduardo [name] ([id number]) [id number] Page 6 of 11 -------------------------------------------------------------------------------- Pain Assessment Details Patient Name: Date of Service: Eduardo [name] 08/06/2023 9:00 A M Medical Record Number: [id number] Patient Account Number: [id number] Date of Birth/Sex: Treating RN: October 30, 1970 (52 y.o. [name] Primary Care Carolie Mcilrath: Saralyn Pilar Other Clinician: Referring Aaliya Maultsby: Treating Mekiah Wahler/Extender: Harrie Jeans Weeks in Treatment: 2 Active Problems Location of Pain Severity and Description of Pain Patient Has Paino Yes Site Locations Pain Location: Pain in Ulcers Duration of the Pain. Constant / Intermittento Constant Rate the pain. Current Pain Level: 7 Character of Pain Describe the Pain:  Aching Pain Management and Medication Current Pain Management: Medication: Yes Electronic Signature(s) Signed: 08/06/2023 11:28:30 AM By: Samuella Bruin Entered By: Samuella Bruin on 08/06/2023 05:38:27 -------------------------------------------------------------------------------- Patient/Caregiver Education Details Patient Name: Date of Service: Eduardo [name] 10/18/2024andnbsp9:00 A M Medical Record Number: [id number] Patient Account Number: [id number] Date of Birth/Gender: Treating RN: 1971/01/12 (  52 y.o. [name] Primary Care Physician: Saralyn Pilar Other Clinician: Referring Physician: Treating Physician/Extender: Park Liter in Treatment: 2 Education Assessment Education Provided To: Patient Education Topics Provided Wound/Skin ImpairmentERVEN, WINKLE ([id number]) [id number] Page 7 of 11 Methods: Explain/Verbal Responses: Reinforcements needed, State content correctly Electronic Signature(s) Signed: 08/06/2023 11:28:30 AM By: Samuella Bruin Entered By: Samuella Bruin on 08/06/2023 05:48:09 -------------------------------------------------------------------------------- Wound Assessment Details Patient Name: Date of Service: Eduardo Ruder W. 08/06/2023 9:00 A M Medical Record Number: [id number] Patient Account Number: [id number] Date of Birth/Sex: Treating RN: 11-Jan-1971 (52 y.o. [name] Primary Care Kiana Hollar: Saralyn Pilar Other Clinician: Referring Chosen Garron: Treating Madysen Faircloth/Extender: Harrie Jeans Weeks in Treatment: 2 Wound Status Wound Number: 1 Primary Neuropathic Ulcer-Non Diabetic Etiology: Wound Location: Right Metatarsal head second Wound Open Wounding Event: Thermal Burn Status: Date Acquired: 04/19/2023 Comorbid Lymphedema, Chronic Obstructive Pulmonary Disease (COPD), Weeks Of Treatment: 2 History: Sleep  Apnea, Deep Vein Thrombosis, Peripheral Arterial Disease, Clustered Wound: No Osteoarthritis, Neuropathy Photos Wound Measurements Length: (cm) 1.4 Width: (cm) 1.1 Depth: (cm) 0.2 Area: (cm) 1.21 Volume: (cm) 0.242 % Reduction in Area: 44.2% % Reduction in Volume: 44.2% Epithelialization: Small (1-33%) Tunneling: No Undermining: No Wound Description Classification: Full Thickness Without Exposed Suppor Wound Margin: Distinct, outline attached Exudate Amount: Medium Exudate Type: Serosanguineous Exudate Color: red, brown t Structures Foul Odor After Cleansing: No Slough/Fibrino Yes Wound Bed Granulation Amount: Large (67-100%) Exposed Structure Granulation Quality: Red Fascia Exposed: No Necrotic Amount: Small (1-33%) Fat Layer (Subcutaneous Tissue) Exposed: Yes Necrotic Quality: Adherent Slough Tendon Exposed: No Muscle Exposed: No Joint Exposed: No Bone Exposed: No [address] Eduardo [name], Eduardo [name] ([id number]) [id number] Page 8 of 11 No Abnormalities Noted: No No Abnormalities Noted: Yes Callus: Yes Temperature / Pain Temperature: No Abnormality Moisture No Abnormalities Noted: Yes Treatment Notes Wound #1 (Metatarsal head second) Wound Laterality: Right Cleanser Soap and Water Discharge Instruction: May shower and wash wound with dial antibacterial soap and water prior to dressing change. Wound Cleanser Discharge Instruction: Cleanse the wound with wound cleanser prior to applying a clean dressing using gauze sponges, not tissue or cotton balls. Peri-Wound Care Topical Primary Dressing Maxorb Extra Ag+ Alginate Dressing, 2x2 (in/in) Discharge Instruction: Apply to wound bed as instructed Secondary Dressing Woven Gauze Sponge, Non-Sterile 4x4 in Discharge Instruction: Apply over primary dressing as directed. Secured With Conforming Stretch Gauze Bandage, Sterile 2x75 (in/in) Discharge Instruction: Secure  with stretch gauze as directed. Compression Wrap Compression Stockings Add-Ons Electronic Signature(s) Signed: 08/06/2023 11:28:30 AM By: Samuella Bruin Entered By: Samuella Bruin on 08/06/2023 05:44:06 -------------------------------------------------------------------------------- Wound Assessment Details Patient Name: Date of Service: Eduardo Ruder W. 08/06/2023 9:00 A M Medical Record Number: [id number] Patient Account Number: [id number] Date of Birth/Sex: Treating RN: 1970-11-14 (52 y.o. [name] Primary Care Charod Slawinski: Saralyn Pilar Other Clinician: Referring Jaryiah Mehlman: Treating Herron Fero/Extender: Harrie Jeans Weeks in Treatment: 2 Wound Status Wound Number: 2 Primary Neuropathic Ulcer-Non Diabetic Etiology: Wound Location: Left Metatarsal head first Wound Open Wounding Event: Gradually Appeared Status: Date Acquired: 05/20/2023 Comorbid Lymphedema, Chronic Obstructive Pulmonary Disease (COPD), Weeks Of Treatment: 2 History: Sleep Apnea, Deep Vein Thrombosis, Peripheral Arterial Disease, Clustered Wound: No Osteoarthritis, Neuropathy Photos Eduardo [name], Eduardo [name] ([id number]) [id number] Page 9 of 11 Wound Measurements Length: (cm) 0.5 Width: (cm) 0.5 Depth: (cm) 0.1 Area: (cm) 0.196 Volume: (cm) 0.02 % Reduction in Area: 55.5% % Reduction in Volume: 77.3% Epithelialization: Small (1-33%) Tunneling:  No Undermining: No Wound Description Classification: Full Thickness Without Exposed Support Structures Wound Margin: Distinct, outline attached Exudate Amount: Medium Exudate Type: Serosanguineous Exudate Color: red, brown Foul Odor After Cleansing: No Slough/Fibrino Yes Wound Bed Granulation Amount: Large (67-100%) Exposed Structure Granulation Quality: Red, Pink Fascia Exposed: No Necrotic Amount: Small (1-33%) Fat Layer (Subcutaneous Tissue) Exposed: Yes Necrotic Quality: Adherent  Slough Tendon Exposed: No Muscle Exposed: No Joint Exposed: No Bone Exposed: No Periwound Skin Texture Texture Color No Abnormalities Noted: No No Abnormalities Noted: Yes Callus: Yes Temperature / Pain Temperature: No Abnormality Moisture No Abnormalities Noted: Yes Treatment Notes Wound #2 (Metatarsal head first) Wound Laterality: Left Cleanser Soap and Water Discharge Instruction: May shower and wash wound with dial antibacterial soap and water prior to dressing change. Wound Cleanser Discharge Instruction: Cleanse the wound with wound cleanser prior to applying a clean dressing using gauze sponges, not tissue or cotton balls. Peri-Wound Care Topical Primary Dressing Maxorb Extra Ag+ Alginate Dressing, 2x2 (in/in) Discharge Instruction: Apply to wound bed as instructed Secondary Dressing Woven Gauze Sponge, Non-Sterile 4x4 in Discharge Instruction: Apply over primary dressing as directed. Secured With Conforming Stretch Gauze Bandage, Sterile 2x75 (in/in) Discharge Instruction: Secure with stretch gauze as directed. Compression Wrap Compression Stockings Add-Ons Eduardo [name], Eduardo [name] ([id number]) [id number].pdf Page 10 of 11 Electronic Signature(s) Signed: 08/06/2023 11:28:30 AM By: Gelene Mink By: Samuella Bruin on 08/06/2023 05:44:25 -------------------------------------------------------------------------------- Wound Assessment Details Patient Name: Date of Service: Eduardo Ruder W. 08/06/2023 9:00 A M Medical Record Number: [id number] Patient Account Number: [id number] Date of Birth/Sex: Treating RN: October 01, 1971 (52 y.o. [name] Primary Care Chirag Krueger: Saralyn Pilar Other Clinician: Referring Meggen Spaziani: Treating Josyah Achor/Extender: Harrie Jeans Weeks in Treatment: 2 Wound Status Wound Number: 3 Primary Neuropathic Ulcer-Non Diabetic Etiology: Wound Location: Right T - Web between  1st and 2nd oe Wound Open Wounding Event: Gradually Appeared Status: Date Acquired: 07/29/2023 Comorbid Lymphedema, Chronic Obstructive Pulmonary Disease (COPD), Weeks Of Treatment: 1 History: Sleep Apnea, Deep Vein Thrombosis, Peripheral Arterial Disease, Clustered Wound: No Osteoarthritis, Neuropathy Photos Wound Measurements Length: (cm) 1.8 Width: (cm) 0.2 Depth: (cm) 0.1 Area: (cm) 0.283 Volume: (cm) 0.028 % Reduction in Area: -258.2% % Reduction in Volume: -250% Epithelialization: Small (1-33%) Tunneling: No Undermining: No Wound Description Classification: Full Thickness Without Exposed Suppor Wound Margin: Distinct, outline attached Exudate Amount: Medium Exudate Type: Serosanguineous Exudate Color: red, brown t Structures Foul Odor After Cleansing: No Slough/Fibrino Yes Wound Bed Granulation Amount: Large (67-100%) Exposed Structure Granulation Quality: Red Fascia Exposed: No Necrotic Amount: Small (1-33%) Fat Layer (Subcutaneous Tissue) Exposed: Yes Necrotic Quality: Adherent Slough Tendon Exposed: No Muscle Exposed: No Joint Exposed: No Bone Exposed: No Periwound Skin Texture Texture Color No Abnormalities Noted: No No Abnormalities Noted: Yes Callus: Yes Temperature / Pain Temperature: No Abnormality Moisture No Abnormalities NotedMICCAH, Eduardo [name] ([id number]) [id number] Page 11 of 11 Treatment Notes Wound #3 (Toe - Web between 1st and 2nd) Wound Laterality: Right Cleanser Soap and Water Discharge Instruction: May shower and wash wound with dial antibacterial soap and water prior to dressing change. Wound Cleanser Discharge Instruction: Cleanse the wound with wound cleanser prior to applying a clean dressing using gauze sponges, not tissue or cotton balls. Peri-Wound Care Topical Primary Dressing Maxorb Extra Ag+ Alginate Dressing, 2x2 (in/in) Discharge Instruction: Apply to wound bed as instructed Secondary  Dressing Woven Gauze Sponge, Non-Sterile 4x4 in Discharge Instruction: Apply over primary dressing as directed. Secured With Conforming Stretch Gauze Bandage, Sterile  2x75 (in/in) Discharge Instruction: Secure with stretch gauze as directed. Compression Wrap Compression Stockings Add-Ons Electronic Signature(s) Signed: 08/06/2023 11:28:30 AM By: Samuella Bruin Entered By: Samuella Bruin on 08/06/2023 05:44:43 -------------------------------------------------------------------------------- Vitals Details Patient Name: Date of Service: Eduardo Ruder W. 08/06/2023 9:00 A M Medical Record Number: [id number] Patient Account Number: [id number] Date of Birth/Sex: Treating RN: 07/02/1971 (52 y.o. [name] Primary Care Ollie Esty: Saralyn Pilar Other Clinician: Referring Jin Shockley: Treating Arcenia Scarbro/Extender: Harrie Jeans Weeks in Treatment: 2 Vital Signs Time Taken: 08:37 Temperature (F): 97.8 Height (in): 77 Pulse (bpm): 89 Weight (lbs): 330 Respiratory Rate (breaths/min): 16 Body Mass Index (BMI): 39.1 Blood Pressure (mmHg): 156/93 Reference Range: 80 - 120 mg / dl Electronic Signature(s) Signed: 08/06/2023 11:28:30 AM By: Samuella Bruin Entered By: Samuella Bruin on 08/06/2023 05:38:15

## 2023-08-13 ENCOUNTER — Encounter (HOSPITAL_BASED_OUTPATIENT_CLINIC_OR_DEPARTMENT_OTHER): Payer: Medicaid Other | Admitting: General Surgery

## 2023-08-13 DIAGNOSIS — L97512 Non-pressure chronic ulcer of other part of right foot with fat layer exposed: Secondary | ICD-10-CM | POA: Diagnosis not present

## 2023-08-13 NOTE — Progress Notes (Signed)
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 performed by Duanne Guess, MD. With the following instrument(s): Curette to remove Non-Viable tissue/material. Material removed includes Callus and Slough and after achieving pain control using Lidocaine 4% Topical Solution. No specimens were taken. A time out was conducted at 10:00, prior to the start of the procedure. A Minimum amount of bleeding was controlled with Pressure. The procedure was tolerated well with a pain level of 0  throughout and a pain level of 0 following the procedure. Post Debridement Measurements: 0.5cm length x 0.4cm width x 0.1cm depth; 0.016cm^3 volume. Character of Wound/Ulcer Post Debridement is improved. 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 There was a Selective/Open oe Wound Non-Viable Tissue Debridement with a total area of 0.14  sq cm performed by Duanne Guess, MD. With the following instrument(s): Curette to remove Non-Viable tissue/material. Material removed includes Callus and Slough and after achieving pain control using Lidocaine 4% T opical Solution. No specimens were taken. A time out was conducted at 10:00, prior to the start of the procedure. A Minimum amount of bleeding was controlled with Pressure. The procedure was tolerated well with a pain level of 0 throughout and a pain level of 0 following the procedure. Post Debridement Measurements: 0.9cm length x 0.2cm width x 0.1cm depth; 0.014cm^3 volume. Character of Wound/Ulcer Post Debridement is improved. Post procedure Diagnosis Wound #3: Same as Pre-Procedure Plan Eduardo [name], Eduardo [name] ([id number]) [id number] Page 9 of 11 Follow-up Appointments: Return Appointment in 1 week. - Dr. Lady Gary - room 2 Anesthetic: (In clinic) Topical Lidocaine 4% applied to wound bed Bathing/ Shower/ Hygiene: May shower and wash wound with soap and water. - with dressing changes Off-Loading: Open toe surgical shoe to: - to both feet with peg assist inserts Additional Orders / Instructions: Stop/Decrease Smoking WOUND #1: - Metatarsal head second Wound Laterality: Right Cleanser: Soap and Water 1 x Per Day/30 Days Discharge Instructions: May shower and wash wound with dial antibacterial soap and water prior to dressing change. Cleanser: Wound Cleanser 1 x Per Day/30  Days Discharge Instructions: Cleanse the wound with wound cleanser prior to applying a clean dressing using gauze sponges, not tissue or cotton balls. Prim Dressing: Maxorb Extra Ag+ Alginate Dressing, 2x2 (in/in) 1 x Per Day/30 Days ary Discharge Instructions: Apply to wound bed as instructed Secondary Dressing: Woven Gauze Sponge, Non-Sterile 4x4 in 1 x Per Day/30 Days Discharge Instructions: Apply over primary dressing as directed. Secured With: Insurance underwriter, Sterile 2x75 (in/in) 1 x Per Day/30 Days Discharge Instructions: Secure with stretch gauze as directed. WOUND #2: - Metatarsal head first Wound Laterality: Left Cleanser: Soap and Water 1 x Per Day/30 Days Discharge Instructions: May shower and wash wound with dial antibacterial soap and water prior to dressing change. Cleanser: Wound Cleanser 1 x Per Day/30 Days Discharge Instructions: Cleanse the wound with wound cleanser prior to applying a clean dressing using gauze sponges, not tissue or cotton balls. Prim Dressing: Maxorb Extra Ag+ Alginate Dressing, 2x2 (in/in) 1 x Per Day/30 Days ary Discharge Instructions: Apply to wound bed as instructed Secondary Dressing: Woven Gauze Sponge, Non-Sterile 4x4 in 1 x Per Day/30 Days Discharge Instructions: Apply over primary dressing as directed. Secured With: Insurance underwriter, Sterile 2x75 (in/in) 1 x Per Day/30 Days Discharge Instructions: Secure with stretch gauze as directed. WOUND #3: - T - Web between 1st and 2nd Wound Laterality: Right oe Cleanser: Soap and Water 1 x Per Day/30 Days Discharge Instructions: May shower and wash wound with dial antibacterial soap and water prior to dressing change. Cleanser: Wound Cleanser 1 x Per Day/30 Days Discharge Instructions: Cleanse the wound with wound cleanser prior to applying a clean dressing using gauze sponges, not tissue or cotton balls. Prim Dressing: Maxorb Extra Ag+ Alginate Dressing, 2x2 (in/in) 1  x Per Day/30 Days ary Discharge Instructions: Apply to wound bed as instructed Secondary Dressing: Woven Gauze Sponge, Non-Sterile 4x4 in 1 x Per Day/30 Days Discharge Instructions: Apply over primary dressing as directed. Secured With: Insurance underwriter, Sterile 2x75 (in/in) 1 x Per Day/30 Days Discharge Instructions: Secure with stretch gauze as directed. 08/13/2023: The plantar ulcers are measuring a little bit smaller today. There is a little callus accumulation around the edges with thin  slough on the surface. The crack between his toes is nearly closed but he still manages to accumulate callus along the margins. I used a curette to debride callus and slough from the left plantar foot wound and the wound between his toes. I debrided callus, slough, and skin from the right plantar foot ulcer. We will continue silver alginate to all sites. Continue peg assist inserts for offloading. Follow-up in 1 week. Electronic Signature(s) Signed: 08/13/2023 10:31:29 AM By: Duanne Guess MD FACS Previous Signature: 08/13/2023 10:24:21 AM Version By: Duanne Guess MD FACS Entered By: Duanne Guess on 08/13/2023 10:31:29 -------------------------------------------------------------------------------- HxROS Details Patient Name: Date of Service: Eduardo Ruder W. 08/13/2023 9:45 A M Medical Record Number: [id number] Patient Account Number: [id number] Date of Birth/Sex: Treating RN: [birth year] (52 y.o. M) Primary Care Provider: Saralyn Pilar Other Clinician: Referring Provider: Treating Provider/Extender: Eduardo [name] in Treatment: 3 Information Obtained From Patient Chart Constitutional Symptoms (General Health) Medical History: Past Medical History Notes: Eduardo [name], Eduardo [name] ([id number]) [id number] Page 10 of 11 MRSA Hematologic/Lymphatic Medical History: Positive for: Lymphedema Respiratory Medical  History: Positive for: Chronic Obstructive Pulmonary Disease (COPD); Sleep Apnea Past Medical History Notes: Pulmonary embolus Cardiovascular Medical History: Positive for: Deep Vein Thrombosis; Peripheral Arterial Disease Negative for: Hypertension Gastrointestinal Medical History: Past Medical History Notes: GERD (gastroesophageal reflux disease) Endocrine Medical History: Negative for: Type I Diabetes; Type II Diabetes Integumentary (Skin) Medical History: Past Medical History Notes: Pruritus Pilonidal cyst stasis dermatitis Musculoskeletal Medical History: Positive for: Osteoarthritis Past Medical History Notes: Chronic back pain Degenerative disk disease Neurologic Medical History: Positive for: Neuropathy Psychiatric Medical History: Past Medical History Notes: Anxiety Depression opiate dependence benzodiazepine dependence Immunizations Pneumococcal Vaccine: Received Pneumococcal Vaccination: No Implantable Devices None Hospitalization / Surgery History Type of Hospitalization/Surgery Mass excision (Right) T arthroplasty (Right) oe Nasal septoplasty w/ turbinoplasty Sinus endo with fusion Wrist fusion Thoracic outlet surgery Irrigation and debridement of back abscess Eduardo [name] ([id number]) [id number] Page 57 of 23 Family and Social History Cancer: Yes - Siblings; Diabetes: Yes - Mother; Heart Disease: Yes - Father; Hereditary Spherocytosis: No; Hypertension: Yes - Father; Kidney Disease: No; Lung Disease: No; Seizures: No; Stroke: No; Thyroid Problems: Yes - Mother; Tuberculosis: No; Current every day smoker - 1 ppd; Marital Status - Divorced; Alcohol Use: Rarely; Drug Use: Prior History; Caffeine Use: Daily; Financial Concerns: No; Food, Clothing or Shelter Needs: No; Support System Lacking: No; Transportation Concerns: No Electronic Signature(s) Signed: 08/13/2023 12:25:45 PM By: Duanne Guess MD  FACS Entered By: Duanne Guess on 08/13/2023 10:23:14 -------------------------------------------------------------------------------- SuperBill Details Patient Name: Date of Service: Eduardo [name] 08/13/2023 Medical Record Number: [id number] Patient Account Number: [id number] Date of Birth/Sex: Treating RN: 09/01/1971 (52 y.o. M) Primary Care Provider: Saralyn Pilar Other Clinician: Referring Provider: Treating Provider/Extender: Eduardo [name] in Treatment: 3 Diagnosis Coding ICD-10 Codes Code Description [phone] Non-pressure chronic ulcer of other part of right foot with fat layer exposed L97.522 Non-pressure chronic ulcer of other part of left foot with fat layer exposed G90.09 Other idiopathic peripheral autonomic neuropathy Z72.0 Tobacco use E66.01 Morbid (severe) obesity due to excess calories Facility Procedures : CPT4 Code: [id number] Description: 97597 - DEBRIDE WOUND 1ST 20 SQ CM OR < ICD-10 Diagnosis Description L97.512 Non-pressure chronic ulcer of other part of right foot with fat layer exposed L97.522 Non-pressure chronic ulcer of other part of left foot with fat layer exposed Modifier: Quantity: 1 Physician Procedures : CPT4 Code Description  Modifier [id number] - WC PHYS LEVEL 3 - EST PT ICD-10 Diagnosis Description L97.512 Non-pressure chronic ulcer of other part of right foot with fat layer exposed L97.522 Non-pressure chronic ulcer of other part of left foot  with fat layer exposed G90.09 Other idiopathic peripheral autonomic neuropathy Z72.0 Tobacco use Quantity: 1 : [id number] - WC PHYS DEBR WO ANESTH 20 SQ CM ICD-10 Diagnosis Description L97.512 Non-pressure chronic ulcer of other part of right foot with fat layer exposed L97.522 Non-pressure chronic ulcer of other part of left foot with fat layer exposed Quantity: 1 Electronic Signature(s) Signed: 08/13/2023 10:31:48 AM By: Duanne Guess MD FACS Entered By:  Duanne Guess on 08/13/2023 10:31:48

## 2023-08-13 NOTE — Progress Notes (Signed)
Eduardo, [name] ([id number]) [id number] Page 1 of 10 Visit Report for 08/13/2023 Arrival Information Details Patient Name: Date of Service: Eduardo [name] 08/13/2023 9:45 A M Medical Record Number: [id number] Patient Account Number: [id number] Date of Birth/Sex: Treating RN: January 24, 1971 (52 y.o. M) Primary Care Keaundra Stehle: Saralyn Pilar Other Clinician: Referring Takeyah Wieman: Treating Anika Shore/Extender: Park Liter in Treatment: 3 Visit Information History Since Last Visit Added or deleted any medications: No Patient Arrived: Ambulatory Any new allergies or adverse reactions: No Arrival Time: 09:30 Had a fall or experienced change in No Accompanied By: self activities of daily living that may affect Transfer Assistance: None risk of falls: Patient Identification Verified: Yes Signs or symptoms of abuse/neglect since last visito No Secondary Verification Process Completed: Yes Hospitalized since last visit: No Patient Has Alerts: Yes Implantable device outside of the clinic excluding No Patient Alerts: ABIs: R:1.11 L:1.18 9/24 cellular tissue based products placed in the center TBIs: R:1.13 L:1.02 9/24 since last visit: Has Dressing in Place as Prescribed: Yes Pain Present Now: Yes Electronic Signature(s) Signed: 08/13/2023 10:42:43 AM By: Karl Ito Entered By: Karl Ito on 08/13/2023 09:31:16 -------------------------------------------------------------------------------- Encounter Discharge Information Details Patient Name: Date of Service: Eduardo Ruder W. 08/13/2023 9:45 A M Medical Record Number: [id number] Patient Account Number: [id number] Date of Birth/Sex: Treating RN: 18-Apr-1971 (52 y.o. [name] Primary Care Everli Rother: Saralyn Pilar Other Clinician: Referring Gladiola Madore: Treating Shiheem Corporan/Extender: Park Liter in Treatment: 3 Encounter  Discharge Information Items Post Procedure Vitals Discharge Condition: Stable Temperature (F): 98 Ambulatory Status: Ambulatory Pulse (bpm): 76 Discharge Destination: Home Respiratory Rate (breaths/min): 16 Transportation: Private Auto Blood Pressure (mmHg): 160/91 Accompanied By: self Schedule Follow-up Appointment: Yes Clinical Summary of Care: Patient Declined Electronic Signature(s) Signed: 08/13/2023 11:55:59 AM By: Samuella Bruin Entered By: Samuella Bruin on 08/13/2023 10:11:50 [name] ([id number]) [id number] Page 2 of 10 -------------------------------------------------------------------------------- Lower Extremity Assessment Details Patient Name: Date of Service: Eduardo [name] 08/13/2023 9:45 A M Medical Record Number: [id number] Patient Account Number: [id number] Date of Birth/Sex: Treating RN: [birth year] (52 y.o. [name] Primary Care Makaria Poarch: Saralyn Pilar Other Clinician: Referring Mardell Suttles: Treating Corban Kistler/Extender: Harrie Jeans Weeks in Treatment: 3 Edema Assessment Assessed: Kyra Searles: No] Franne Forts: No] [Left: Edema] [Right: :] Calf Left: Right: Point of Measurement: From Medial Instep 48.5 cm 49.4 cm Ankle Left: Right: Point of Measurement: From Medial Instep 29.5 cm 30 cm Vascular Assessment Extremity colors, hair growth, and conditions: Extremity Color: [Left:Hyperpigmented] [Right:Hyperpigmented] Hair Growth on Extremity: [Left:Yes] [Right:Yes] Temperature of Extremity: [Left:Warm] [Right:Warm] Capillary Refill: [Left:< 3 seconds] [Right:< 3 seconds] Dependent Rubor: [Left:No No] [Right:No No] Electronic Signature(s) Signed: 08/13/2023 11:55:59 AM By: Samuella Bruin Entered By: Samuella Bruin on 08/13/2023 09:53:15 -------------------------------------------------------------------------------- Multi Wound Chart Details Patient Name: Date of  Service: Eduardo Ruder W. 08/13/2023 9:45 A M Medical Record Number: [id number] Patient Account Number: [id number] Date of Birth/Sex: Treating RN: 1971/01/20 (52 y.o. M) Primary Care Deonne Rooks: Saralyn Pilar Other Clinician: Referring Mariel Gaudin: Treating Siennah Barrasso/Extender: Harrie Jeans Weeks in Treatment: 3 Vital Signs Height(in): 77 Pulse(bpm): 76 Weight(lbs): 330 Blood Pressure(mmHg): 160/91 Body Mass Index(BMI): 39.1 Temperature(F): 98.0 Respiratory Rate(breaths/min): 16 [1:Photos:] [3:[id number] Page 3 of 10] Right Metatarsal head second Left Metatarsal head first Right T - Web between 1st and 2nd oe Wound Location: Thermal Burn Gradually Appeared Gradually Appeared Wounding Event: Neuropathic Ulcer-Non Diabetic Neuropathic Ulcer-Non Diabetic Neuropathic Ulcer-Non Diabetic  Primary Etiology: Lymphedema, Chronic Obstructive Lymphedema, Chronic Obstructive Lymphedema, Chronic Obstructive Comorbid History: Pulmonary Disease (COPD), Sleep Pulmonary Disease (COPD), Sleep Pulmonary Disease (COPD), Sleep Apnea, Deep Vein Thrombosis, Apnea, Deep Vein Thrombosis, Apnea, Deep Vein Thrombosis, Peripheral Arterial Disease, Peripheral Arterial Disease, Peripheral Arterial Disease, Osteoarthritis, Neuropathy Osteoarthritis, Neuropathy Osteoarthritis, Neuropathy 04/19/2023 05/20/2023 07/29/2023 Date Acquired: 3 3 2  Weeks of Treatment: Open Open Open Wound Status: No No No Wound Recurrence: 1.2x0.9x0.2 0.5x0.4x0.1 0.9x0.2x0.1 Measurements L x W x D (cm) 0.848 0.157 0.141 A (cm) : rea 0.17 0.016 0.014 Volume (cm) : 60.90% 64.30% -78.50% % Reduction in A rea: 60.80% 81.80% -75.00% % Reduction in Volume: Full Thickness Without Exposed Full Thickness Without Exposed Full Thickness Without Exposed Classification: Support Structures Support Structures Support Structures Medium Medium Medium Exudate A mount: Serosanguineous  Serosanguineous Serosanguineous Exudate Type: red, brown red, brown red, brown Exudate Color: Distinct, outline attached Distinct, outline attached Distinct, outline attached Wound Margin: Large (67-100%) Large (67-100%) Large (67-100%) Granulation A mount: Red, Pink Red, Pink Red Granulation Quality: Small (1-33%) Small (1-33%) Small (1-33%) Necrotic A mount: Fat Layer (Subcutaneous Tissue): Yes Fat Layer (Subcutaneous Tissue): Yes Fat Layer (Subcutaneous Tissue): Yes Exposed Structures: Fascia: No Fascia: No Fascia: No Tendon: No Tendon: No Tendon: No Muscle: No Muscle: No Muscle: No Joint: No Joint: No Joint: No Bone: No Bone: No Bone: No Small (1-33%) Medium (34-66%) Medium (34-66%) Epithelialization: Debridement - Selective/Open Wound Debridement - Selective/Open Wound Debridement - Selective/Open Wound Debridement: Pre-procedure Verification/Time Out 10:00 10:00 10:00 Taken: Lidocaine 4% T opical Solution Lidocaine 4% T opical Solution Lidocaine 4% T opical Solution Pain Control: Callus, Slough Callus, Slough Callus, Slough Tissue Debrided: Skin/Epidermis Non-Viable Tissue Non-Viable Tissue Level: 0.85 0.16 0.14 Debridement A (sq cm): rea Curette Curette Curette Instrument: Minimum Minimum Minimum Bleeding: Pressure Pressure Pressure Hemostasis A chieved: 0 0 0 Procedural Pain: 0 0 0 Post Procedural Pain: Procedure was tolerated well Procedure was tolerated well Procedure was tolerated well Debridement Treatment Response: 1.2x0.9x0.2 0.5x0.4x0.1 0.9x0.2x0.1 Post Debridement Measurements L x W x D (cm) 0.17 0.016 0.014 Post Debridement Volume: (cm) Callus: Yes Callus: Yes Callus: Yes Periwound Skin Texture: No Abnormalities Noted No Abnormalities Noted No Abnormalities Noted Periwound Skin Moisture: No Abnormalities Noted No Abnormalities Noted No Abnormalities Noted Periwound Skin Color: No Abnormality No Abnormality No  Abnormality Temperature: Debridement Debridement Debridement Procedures Performed: Treatment Notes Electronic Signature(s) Signed: 08/13/2023 10:07:52 AM By: Duanne Guess MD FACS Entered By: Duanne Guess on 08/13/2023 10:07:51 -------------------------------------------------------------------------------- Multi-Disciplinary Care Plan Details Patient Name: Date of Service: Eduardo Ruder W. 08/13/2023 9:45 A M Medical Record Number: [id number] Patient Account Number: [id number] Date of Birth/Sex: Treating RN: 1971/05/10 (52 y.o. [name] Primary Care Deoni Cosey: Saralyn Pilar Other Clinician: Referring Emrie Gayle: Treating Madelaine Whipple/Extender: Harrie Jeans [name] ([id number]) [id number] Page 4 of 10 Weeks in Treatment: 3 Multidisciplinary Care Plan reviewed with physician Active Inactive Peripheral Neuropathy Nursing Diagnoses: Knowledge deficit related to disease process and management of peripheral neurovascular dysfunction Potential alteration in peripheral tissue perfusion (select prior to confirmation of diagnosis) Goals: Patient/caregiver will verbalize understanding of disease process and disease management Date Initiated: 07/22/2023 Target Resolution Date: 09/24/2023 Goal Status: Active Interventions: Assess signs and symptoms of neuropathy upon admission and as needed Provide education on Management of Neuropathy and Related Ulcers Notes: Wound/Skin Impairment Nursing Diagnoses: Impaired tissue integrity Knowledge deficit related to ulceration/compromised skin integrity Goals: Patient will demonstrate a reduced rate of smoking or cessation of smoking Date Initiated:  07/22/2023 Target Resolution Date: 09/24/2023 Goal Status: Active Patient/caregiver will verbalize understanding of skin care regimen Date Initiated: 07/22/2023 Target Resolution Date: 09/24/2023 Goal Status:  Active Interventions: Assess patient/caregiver ability to obtain necessary supplies Assess patient/caregiver ability to perform ulcer/skin care regimen upon admission and as needed Assess ulceration(s) every visit Provide education on smoking Treatment Activities: Referred to DME Hakop Humbarger for dressing supplies : 07/22/2023 Skin care regimen initiated : 07/22/2023 Topical wound management initiated : 07/22/2023 Notes: Electronic Signature(s) Signed: 08/13/2023 12:18:37 PM By: Zenaida Deed RN, BSN Entered By: Zenaida Deed on 08/13/2023 09:28:26 -------------------------------------------------------------------------------- Pain Assessment Details Patient Name: Date of Service: Eduardo Ruder W. 08/13/2023 9:45 A M Medical Record Number: [id number] Patient Account Number: [id number] Date of Birth/Sex: Treating RN: Jan 07, 1971 (52 y.o. M) Primary Care Emmalynn Pinkham: Saralyn Pilar Other Clinician: Referring Almee Pelphrey: Treating Sanyiah Kanzler/Extender: Park Liter in Treatment: 3 Active Problems Location of Pain Severity and Description of Pain KONG, BURKEEN ([id number]) [phone].pdf Page 5 of 10 Patient Has Paino Yes Site Locations Rate the pain. Current Pain Level: 7 Pain Management and Medication Current Pain Management: Electronic Signature(s) Signed: 08/13/2023 10:42:43 AM By: Karl Ito Entered By: Karl Ito on 08/13/2023 09:31:40 -------------------------------------------------------------------------------- Patient/Caregiver Education Details Patient Name: Date of Service: Eduardo [name] 10/25/2024andnbsp9:45 A M Medical Record Number: [id number] Patient Account Number: [id number] Date of Birth/Gender: Treating RN: 01/14/1971 (52 y.o. [name] Primary Care Physician: Saralyn Pilar Other Clinician: Referring Physician: Treating Physician/Extender: Park Liter in Treatment: 3 Education Assessment Education Provided To: Patient Education Topics Provided Offloading: Methods: Explain/Verbal Responses: Reinforcements needed, State content correctly Wound/Skin Impairment: Methods: Explain/Verbal Responses: Reinforcements needed, State content correctly Electronic Signature(s) Signed: 08/13/2023 12:18:37 PM By: Zenaida Deed RN, BSN Entered By: Zenaida Deed on 08/13/2023 09:57:50 [name] ([id number]) [id number] Page 6 of 10 -------------------------------------------------------------------------------- Wound Assessment Details Patient Name: Date of Service: Eduardo [name] 08/13/2023 9:45 A M Medical Record Number: [id number] Patient Account Number: [id number] Date of Birth/Sex: Treating RN: [birth year] (52 y.o. [name] Primary Care Fronie Holstein: Saralyn Pilar Other Clinician: Referring Omere Marti: Treating Deionna Marcantonio/Extender: Harrie Jeans Weeks in Treatment: 3 Wound Status Wound Number: 1 Primary Neuropathic Ulcer-Non Diabetic Etiology: Wound Location: Right Metatarsal head second Wound Open Wounding Event: Thermal Burn Status: Date Acquired: 04/19/2023 Comorbid Lymphedema, Chronic Obstructive Pulmonary Disease (COPD), Weeks Of Treatment: 3 History: Sleep Apnea, Deep Vein Thrombosis, Peripheral Arterial Disease, Clustered Wound: No Osteoarthritis, Neuropathy Photos Wound Measurements Length: (cm) 1.2 Width: (cm) 0.9 Depth: (cm) 0.2 Area: (cm) 0.848 Volume: (cm) 0.17 % Reduction in Area: 60.9% % Reduction in Volume: 60.8% Epithelialization: Small (1-33%) Tunneling: No Undermining: No Wound Description Classification: Full Thickness Without Exposed Support Structures Wound Margin: Distinct, outline attached Exudate Amount: Medium Exudate Type: Serosanguineous Exudate Color: red, brown Foul Odor After Cleansing: No Slough/Fibrino  Yes Wound Bed Granulation Amount: Large (67-100%) Exposed Structure Granulation Quality: Red, Pink Fascia Exposed: No Necrotic Amount: Small (1-33%) Fat Layer (Subcutaneous Tissue) Exposed: Yes Necrotic Quality: Adherent Slough Tendon Exposed: No Muscle Exposed: No Joint Exposed: No Bone Exposed: No Periwound Skin Texture Texture Color No Abnormalities Noted: No No Abnormalities Noted: Yes Callus: Yes Temperature / Pain Temperature: No Abnormality Moisture No Abnormalities Noted: Yes Treatment Notes Wound #1 (Metatarsal head second) Wound Laterality: Right Cleanser Soap and Water Discharge Instruction: May shower and wash wound with dial antibacterial soap and water prior to dressing change. Wound Cleanser Discharge Instruction:  Cleanse the wound with wound cleanser prior to applying a clean dressing using gauze sponges, not tissue or cotton balls. Peri-Wound Care NAPOLEAN, SIDMAN ([id number]) [id number].pdf Page 7 of 10 Topical Primary Dressing Maxorb Extra Ag+ Alginate Dressing, 2x2 (in/in) Discharge Instruction: Apply to wound bed as instructed Secondary Dressing Woven Gauze Sponge, Non-Sterile 4x4 in Discharge Instruction: Apply over primary dressing as directed. Secured With Conforming Stretch Gauze Bandage, Sterile 2x75 (in/in) Discharge Instruction: Secure with stretch gauze as directed. Compression Wrap Compression Stockings Add-Ons Electronic Signature(s) Signed: 08/13/2023 11:55:59 AM By: Samuella Bruin Entered By: Samuella Bruin on 08/13/2023 09:53:33 -------------------------------------------------------------------------------- Wound Assessment Details Patient Name: Date of Service: Eduardo Ruder W. 08/13/2023 9:45 A M Medical Record Number: [id number] Patient Account Number: [id number] Date of Birth/Sex: Treating RN: 27-Dec-1970 (52 y.o. [name] Primary Care Jonia Oakey: Saralyn Pilar Other  Clinician: Referring Jamea Robicheaux: Treating Carman Essick/Extender: Harrie Jeans Weeks in Treatment: 3 Wound Status Wound Number: 2 Primary Neuropathic Ulcer-Non Diabetic Etiology: Wound Location: Left Metatarsal head first Wound Open Wounding Event: Gradually Appeared Status: Date Acquired: 05/20/2023 Comorbid Lymphedema, Chronic Obstructive Pulmonary Disease (COPD), Weeks Of Treatment: 3 History: Sleep Apnea, Deep Vein Thrombosis, Peripheral Arterial Disease, Clustered Wound: No Osteoarthritis, Neuropathy Photos Wound Measurements Length: (cm) Width: (cm) Depth: (cm) Area: (cm) Volume: (cm) 0.5 % Reduction in Area: 64.3% 0.4 % Reduction in Volume: 81.8% 0.1 Epithelialization: Medium (34-66%) 0.157 Tunneling: No 0.016 Undermining: No Wound Description Classification: Full Thickness Without Exposed Support Structures Wound Margin: Distinct, outline attached Exudate Amount: Medium CJ, GOREY ([id number]) Exudate Type: Serosanguineous Exudate Color: red, brown Foul Odor After Cleansing: No Slough/Fibrino Yes [phone].pdf Page 8 of 10 Wound Bed Granulation Amount: Large (67-100%) Exposed Structure Granulation Quality: Red, Pink Fascia Exposed: No Necrotic Amount: Small (1-33%) Fat Layer (Subcutaneous Tissue) Exposed: Yes Necrotic Quality: Adherent Slough Tendon Exposed: No Muscle Exposed: No Joint Exposed: No Bone Exposed: No Periwound Skin Texture Texture Color No Abnormalities Noted: No No Abnormalities Noted: Yes Callus: Yes Temperature / Pain Temperature: No Abnormality Moisture No Abnormalities Noted: Yes Treatment Notes Wound #2 (Metatarsal head first) Wound Laterality: Left Cleanser Soap and Water Discharge Instruction: May shower and wash wound with dial antibacterial soap and water prior to dressing change. Wound Cleanser Discharge Instruction: Cleanse the wound with wound cleanser prior to applying a  clean dressing using gauze sponges, not tissue or cotton balls. Peri-Wound Care Topical Primary Dressing Maxorb Extra Ag+ Alginate Dressing, 2x2 (in/in) Discharge Instruction: Apply to wound bed as instructed Secondary Dressing Woven Gauze Sponge, Non-Sterile 4x4 in Discharge Instruction: Apply over primary dressing as directed. Secured With Conforming Stretch Gauze Bandage, Sterile 2x75 (in/in) Discharge Instruction: Secure with stretch gauze as directed. Compression Wrap Compression Stockings Add-Ons Electronic Signature(s) Signed: 08/13/2023 11:55:59 AM By: Samuella Bruin Entered By: Samuella Bruin on 08/13/2023 09:53:46 -------------------------------------------------------------------------------- Wound Assessment Details Patient Name: Date of Service: Eduardo Ruder W. 08/13/2023 9:45 A M Medical Record Number: [id number] Patient Account Number: [id number] Date of Birth/Sex: Treating RN: 09-17-1971 (52 y.o. [name] Primary Care Yailene Badia: Saralyn Pilar Other Clinician: Referring Nyshawn Gowdy: Treating Jehad Bisono/Extender: Harrie Jeans Weeks in Treatment: 3 Wound Status Wound Number: 3 Primary Neuropathic Ulcer-Non Diabetic Etiology: Wound Location: Right T - Web between 1st and 2nd DEKEL, WAITERS ([id number]) [phone] Page 9 of 10 Wound Open Wounding Event: Gradually Appeared Status: Date Acquired: 07/29/2023 Comorbid Lymphedema, Chronic Obstructive Pulmonary Disease (COPD), Weeks Of Treatment: 2 History: Sleep Apnea, Deep Vein Thrombosis,  Peripheral Arterial Disease, Clustered Wound: No Osteoarthritis, Neuropathy Photos Wound Measurements Length: (cm) 0.9 Width: (cm) 0.2 Depth: (cm) 0.1 Area: (cm) 0.141 Volume: (cm) 0.014 % Reduction in Area: -78.5% % Reduction in Volume: -75% Epithelialization: Medium (34-66%) Tunneling: No Undermining: No Wound Description Classification:  Full Thickness Without Exposed Support Structures Wound Margin: Distinct, outline attached Exudate Amount: Medium Exudate Type: Serosanguineous Exudate Color: red, brown Foul Odor After Cleansing: No Slough/Fibrino Yes Wound Bed Granulation Amount: Large (67-100%) Exposed Structure Granulation Quality: Red Fascia Exposed: No Necrotic Amount: Small (1-33%) Fat Layer (Subcutaneous Tissue) Exposed: Yes Necrotic Quality: Adherent Slough Tendon Exposed: No Muscle Exposed: No Joint Exposed: No Bone Exposed: No Periwound Skin Texture Texture Color No Abnormalities Noted: No No Abnormalities Noted: Yes Callus: Yes Temperature / Pain Temperature: No Abnormality Moisture No Abnormalities Noted: Yes Treatment Notes Wound #3 (Toe - Web between 1st and 2nd) Wound Laterality: Right Cleanser Soap and Water Discharge Instruction: May shower and wash wound with dial antibacterial soap and water prior to dressing change. Wound Cleanser Discharge Instruction: Cleanse the wound with wound cleanser prior to applying a clean dressing using gauze sponges, not tissue or cotton balls. Peri-Wound Care Topical Primary Dressing Maxorb Extra Ag+ Alginate Dressing, 2x2 (in/in) Discharge Instruction: Apply to wound bed as instructed Secondary Dressing Woven Gauze Sponge, Non-Sterile 4x4 in Discharge Instruction: Apply over primary dressing as directed. Secured With [employer], Sterile 2x75 (in/in) BRAYSEN, HYDEN ([id number]) [id number] Page 10 of 10 Discharge Instruction: Secure with stretch gauze as directed. Compression Wrap Compression Stockings Add-Ons Electronic Signature(s) Signed: 08/13/2023 11:55:59 AM By: Samuella Bruin Entered By: Samuella Bruin on 08/13/2023 09:54:02 -------------------------------------------------------------------------------- Vitals Details Patient Name: Date of Service: Eduardo Ruder W.  08/13/2023 9:45 A M Medical Record Number: [id number] Patient Account Number: [id number] Date of Birth/Sex: Treating RN: [birth year] (52 y.o. M) Primary Care Shanyce Daris: Saralyn Pilar Other Clinician: Referring Meah Jiron: Treating Telesha Deguzman/Extender: Harrie Jeans Weeks in Treatment: 3 Vital Signs Time Taken: 09:31 Temperature (F): 98.0 Height (in): 77 Pulse (bpm): 76 Weight (lbs): 330 Respiratory Rate (breaths/min): 16 Body Mass Index (BMI): 39.1 Blood Pressure (mmHg): 160/91 Reference Range: 80 - 120 mg / dl Electronic Signature(s) Signed: 08/13/2023 10:42:43 AM By: Karl Ito Entered By: Karl Ito on 08/13/2023 09:31:33

## 2023-08-19 ENCOUNTER — Ambulatory Visit: Payer: Medicaid Other | Admitting: Family Medicine

## 2023-08-19 ENCOUNTER — Encounter (HOSPITAL_BASED_OUTPATIENT_CLINIC_OR_DEPARTMENT_OTHER): Payer: Medicaid Other | Admitting: General Surgery

## 2023-08-19 DIAGNOSIS — L97512 Non-pressure chronic ulcer of other part of right foot with fat layer exposed: Secondary | ICD-10-CM | POA: Diagnosis not present

## 2023-08-19 NOTE — Progress Notes (Addendum)
Eduardo [name] ([id number]) [id number] Page 1 of 10 Visit Report for 08/19/2023 Arrival Information Details Patient Name: Date of Service: Eduardo [name] 08/19/2023 1:00 PM Medical Record Number: [id number] Patient Account Number: [id number] Date of Birth/Sex: Treating RN: 08/09/1971 (52 y.o. M) Primary Care Eduardo [name]: Eduardo [name] Other Clinician: Referring Eduardo [name]: Treating Eduardo [name]/Extender: Eduardo [name] in Treatment: 4 Visit Information History Since Last Visit Added or deleted any medications: No Patient Arrived: Ambulatory Any new allergies or adverse reactions: No Arrival Time: 13:02 Had a fall or experienced change in No Accompanied By: self activities of daily living that may affect Transfer Assistance: None risk of falls: Patient Identification Verified: Yes Signs or symptoms of abuse/neglect since last visito No Secondary Verification Process Completed: Yes Hospitalized since last visit: No Patient Has Alerts: Yes Implantable device outside of the clinic excluding No Patient Alerts: ABIs: R:1.11 L:1.18 9/24 cellular tissue based products placed in the center TBIs: R:1.13 L:1.02 9/24 since last visit: Has Dressing in Place as Prescribed: Yes Pain Present Now: Yes Electronic Signature(s) Signed: 08/19/2023 5:49:41 PM By: Eduardo Deed RN, BSN Previous Signature: 08/19/2023 1:11:09 PM Version By: Eduardo [name] Entered By: Eduardo [name] on 08/19/2023 10:24:22 -------------------------------------------------------------------------------- Encounter Discharge Information Details Patient Name: Date of Service: Eduardo Ruder W. 08/19/2023 1:00 PM Medical Record Number: [id number] Patient Account Number: [id number] Date of Birth/Sex: Treating RN: 04/08/1971 (52 y.o. Eduardo [name] Primary Care Eduardo [name]: Eduardo [name] Other Clinician: Referring Sami Froh: Treating Eduardo [name]/Extender:  Eduardo [name] in Treatment: 4 Encounter Discharge Information Items Post Procedure Vitals Discharge Condition: Stable Temperature (F): 97.8 Ambulatory Status: Ambulatory Pulse (bpm): 70 Discharge Destination: Home Respiratory Rate (breaths/min): 18 Transportation: Private Auto Blood Pressure (mmHg): 133/84 Accompanied By: self Schedule Follow-up Appointment: Yes Clinical Summary of Care: Patient Declined Electronic Signature(s) Signed: 08/19/2023 5:49:41 PM By: Eduardo Deed RN, BSN Entered By: Eduardo [name] on 08/19/2023 14:05:16 Eduardo [name] ([id number]) [id number] Page 2 of 10 -------------------------------------------------------------------------------- Lower Extremity Assessment Details Patient Name: Date of Service: Eduardo [name] 08/19/2023 1:00 PM Medical Record Number: [id number] Patient Account Number: [id number] Date of Birth/Sex: Treating RN: [birth year] (52 y.o. Eduardo [name] Primary Care Hao Dion: Eduardo [name] Other Clinician: Referring Eduardo [name]: Treating Eduardo [name]/Extender: Eduardo [name] Weeks in Treatment: 4 Edema Assessment Assessed: [Left: No] [Right: No] Edema: [Left: Yes] [Right: Yes] Calf Left: Right: Point of Measurement: From Medial Instep 48.5 cm 49.4 cm Ankle Left: Right: Point of Measurement: From Medial Instep 29.5 cm 30 cm Vascular Assessment Pulses: Dorsalis Pedis Palpable: [Left:Yes] [Right:Yes] Extremity colors, hair growth, and conditions: Extremity Color: [Left:Hyperpigmented] [Right:Hyperpigmented] Hair Growth on Extremity: [Left:Yes] [Right:Yes] Temperature of Extremity: [Left:Warm] [Right:Warm] Capillary Refill: [Left:< 3 seconds] [Right:< 3 seconds] Dependent Rubor: [Left:No No] [Right:No No] Electronic Signature(s) Signed: 08/19/2023 5:49:41 PM By: Eduardo Deed RN, BSN Entered By: Eduardo [name] on 08/19/2023  10:25:51 -------------------------------------------------------------------------------- Multi Wound Chart Details Patient Name: Date of Service: Eduardo Ruder W. 08/19/2023 1:00 PM Medical Record Number: [id number] Patient Account Number: [id number] Date of Birth/Sex: Treating RN: 1970-11-18 (52 y.o. M) Primary Care Eduardo [name]: Eduardo [name] Other Clinician: Referring Eduardo [name]: Treating Eduardo [name]/Extender: Eduardo [name] Weeks in Treatment: 4 Vital Signs Height(in): 77 Pulse(bpm): 70 Weight(lbs): 330 Blood Pressure(mmHg): 133/84 Body Mass Index(BMI): 39.1 Temperature(F): 97.8 Respiratory Rate(breaths/min): 18 [1:Photos:] [[id number] Page 3 of 10] Right Metatarsal head second Left Metatarsal head first Right T - Web between 1st and 2nd oe Wound  Location: Thermal Burn Gradually Appeared Gradually Appeared Wounding Event: Neuropathic Ulcer-Non Diabetic Neuropathic Ulcer-Non Diabetic Neuropathic Ulcer-Non Diabetic Primary Etiology: Lymphedema, Chronic Obstructive Lymphedema, Chronic Obstructive Lymphedema, Chronic Obstructive Comorbid History: Pulmonary Disease (COPD), Sleep Pulmonary Disease (COPD), Sleep Pulmonary Disease (COPD), Sleep Apnea, Deep Vein Thrombosis, Apnea, Deep Vein Thrombosis, Apnea, Deep Vein Thrombosis, Peripheral Arterial Disease, Peripheral Arterial Disease, Peripheral Arterial Disease, Osteoarthritis, Neuropathy Osteoarthritis, Neuropathy Osteoarthritis, Neuropathy 04/19/2023 05/20/2023 07/29/2023 Date Acquired: 4 4 2  Weeks of Treatment: Open Open Open Wound Status: No No No Wound Recurrence: 1.1x0.9x0.2 0.3x0.3x0.1 0.8x0.1x0.1 Measurements L x W x D (cm) 0.778 0.071 0.063 A (cm) : rea 0.156 0.007 0.006 Volume (cm) : 64.10% 83.90% 20.30% % Reduction in A rea: 64.10% 92.00% 25.00% % Reduction in Volume: Full Thickness Without Exposed Full Thickness Without Exposed Full Thickness Without  Exposed Classification: Support Structures Support Structures Support Structures Medium Medium Medium Exudate A mount: Serosanguineous Serosanguineous Serosanguineous Exudate Type: red, brown red, brown red, brown Exudate Color: Distinct, outline attached Distinct, outline attached Distinct, outline attached Wound Margin: Large (67-100%) Large (67-100%) Large (67-100%) Granulation A mount: Red Red, Pink Red Granulation Quality: None Present (0%) None Present (0%) None Present (0%) Necrotic A mount: Fat Layer (Subcutaneous Tissue): Yes Fat Layer (Subcutaneous Tissue): Yes Fat Layer (Subcutaneous Tissue): Yes Exposed Structures: Fascia: No Fascia: No Fascia: No Tendon: No Tendon: No Tendon: No Muscle: No Muscle: No Muscle: No Joint: No Joint: No Joint: No Bone: No Bone: No Bone: No Small (1-33%) Small (1-33%) Medium (34-66%) Epithelialization: Debridement - Excisional Debridement - Excisional Debridement - Excisional Debridement: Pre-procedure Verification/Time Out 13:45 13:45 13:45 Taken: Callus, Subcutaneous, Slough Callus, Subcutaneous, Slough Callus, Subcutaneous Tissue Debrided: Skin/Subcutaneous Tissue Skin/Subcutaneous Tissue Skin/Subcutaneous Tissue Level: 0.78 0.2 0.07 Debridement A (sq cm): rea Curette Curette Curette Instrument: Minimum Minimum Minimum Bleeding: Pressure Pressure Pressure Hemostasis A chieved: 0 0 0 Procedural Pain: 0 0 0 Post Procedural Pain: Procedure was tolerated well Procedure was tolerated well Procedure was tolerated well Debridement Treatment Response: 1.1x0.9x0.1 0.5x0.5x0.1 0.8x0.1x0.1 Post Debridement Measurements L x W x D (cm) 0.078 0.02 0.006 Post Debridement Volume: (cm) Callus: Yes Callus: Yes Callus: Yes Periwound Skin Texture: No Abnormalities Noted No Abnormalities Noted No Abnormalities Noted Periwound Skin Moisture: No Abnormalities Noted No Abnormalities Noted No Abnormalities Noted Periwound Skin  Color: No Abnormality No Abnormality No Abnormality Temperature: Debridement Debridement Debridement Procedures Performed: Treatment Notes Electronic Signature(s) Signed: 08/19/2023 2:12:49 PM By: Duanne Guess MD FACS Entered By: Duanne Guess on 08/19/2023 11:12:49 -------------------------------------------------------------------------------- Multi-Disciplinary Care Plan Details Patient Name: Date of Service: Eduardo Ruder W. 08/19/2023 1:00 PM Medical Record Number: [id number] Patient Account Number: [id number] Date of Birth/Sex: Treating RN: 1971-05-19 (52 y.o. [place], [place], [place] ([id number]) [id number] Page 4 of 10 Primary Care Collie Kittel: Eduardo [name] Other Clinician: Referring Lavetta Geier: Treating Marsia Cino/Extender: Eduardo [name] in Treatment: 4 Multidisciplinary Care Plan reviewed with physician Active Inactive Peripheral Neuropathy Nursing Diagnoses: Knowledge deficit related to disease process and management of peripheral neurovascular dysfunction Potential alteration in peripheral tissue perfusion (select prior to confirmation of diagnosis) Goals: Patient/caregiver will verbalize understanding of disease process and disease management Date Initiated: 07/22/2023 Target Resolution Date: 09/24/2023 Goal Status: Active Interventions: Assess signs and symptoms of neuropathy upon admission and as needed Provide education on Management of Neuropathy and Related Ulcers Notes: Wound/Skin Impairment Nursing Diagnoses: Impaired tissue integrity Knowledge deficit related to ulceration/compromised skin integrity Goals: Patient will demonstrate a reduced rate of smoking or cessation of smoking  Date Initiated: 07/22/2023 Target Resolution Date: 09/24/2023 Goal Status: Active Patient/caregiver will verbalize understanding of skin care regimen Date Initiated: 07/22/2023 Target Resolution Date:  09/24/2023 Goal Status: Active Interventions: Assess patient/caregiver ability to obtain necessary supplies Assess patient/caregiver ability to perform ulcer/skin care regimen upon admission and as needed Assess ulceration(s) every visit Provide education on smoking Treatment Activities: Referred to DME Trevis Eden for dressing supplies : 07/22/2023 Skin care regimen initiated : 07/22/2023 Topical wound management initiated : 07/22/2023 Notes: Electronic Signature(s) Signed: 08/19/2023 5:49:41 PM By: Eduardo Deed RN, BSN Entered By: Eduardo [name] on 08/19/2023 10:29:12 -------------------------------------------------------------------------------- Pain Assessment Details Patient Name: Date of Service: Eduardo Ruder W. 08/19/2023 1:00 PM Medical Record Number: [id number] Patient Account Number: [id number] Date of Birth/Sex: Treating RN: September 19, 1971 (52 y.o. M) Primary Care Vishwa Dais: Eduardo [name] Other Clinician: Referring Gracie Gupta: Treating Kemani Demarais/Extender: Eduardo [name] in Treatment: [address] PADEN, SENGER ([id number]) [id number] Page 5 of 10 Location of Pain Severity and Description of Pain Patient Has Paino Yes Site Locations Rate the pain. Current Pain Level: 7 Worst Pain Level: 10 Least Pain Level: 0 Tolerable Pain Level: 3 Character of Pain Describe the Pain: Other: neuropathy pain Pain Management and Medication Current Pain Management: Electronic Signature(s) Signed: 08/19/2023 5:49:41 PM By: Eduardo Deed RN, BSN Previous Signature: 08/19/2023 1:11:09 PM Version By: Eduardo [name] Entered By: Eduardo [name] on 08/19/2023 10:24:57 -------------------------------------------------------------------------------- Patient/Caregiver Education Details Patient Name: Date of Service: Eduardo [name] 10/31/2024andnbsp1:00 PM Medical Record Number: [id number] Patient Account Number:  [id number] Date of Birth/Gender: Treating RN: Apr 11, 1971 (52 y.o. Eduardo [name] Primary Care Physician: Eduardo [name] Other Clinician: Referring Physician: Treating Physician/Extender: Eduardo [name] in Treatment: 4 Education Assessment Education Provided To: Patient Education Topics Provided Offloading: Methods: Explain/Verbal Responses: Reinforcements needed, State content correctly Wound/Skin Impairment: Methods: Explain/Verbal Responses: Reinforcements needed, State content correctly Electronic Signature(s) Signed: 08/19/2023 5:49:41 PM By: Eduardo Deed RN, BSN Entered By: Eduardo [name] on 08/19/2023 10:30:06 Eduardo [name] ([id number]) [id number] Page 6 of 10 -------------------------------------------------------------------------------- Wound Assessment Details Patient Name: Date of Service: Eduardo [name] 08/19/2023 1:00 PM Medical Record Number: [id number] Patient Account Number: [id number] Date of Birth/Sex: Treating RN: 02/16/1971 (52 y.o. M) Primary Care Sanam Marmo: Eduardo [name] Other Clinician: Referring Yelina Sarratt: Treating Cash Duce/Extender: Eduardo [name] Weeks in Treatment: 4 Wound Status Wound Number: 1 Primary Neuropathic Ulcer-Non Diabetic Etiology: Wound Location: Right Metatarsal head second Wound Open Wounding Event: Thermal Burn Status: Date Acquired: 04/19/2023 Comorbid Lymphedema, Chronic Obstructive Pulmonary Disease (COPD), Weeks Of Treatment: 4 History: Sleep Apnea, Deep Vein Thrombosis, Peripheral Arterial Disease, Clustered Wound: No Osteoarthritis, Neuropathy Photos Wound Measurements Length: (cm) 1.1 Width: (cm) 0.9 Depth: (cm) 0.2 Area: (cm) 0.778 Volume: (cm) 0.156 % Reduction in Area: 64.1% % Reduction in Volume: 64.1% Epithelialization: Small (1-33%) Tunneling: No Undermining: No Wound Description Classification: Full Thickness Without  Exposed Support Wound Margin: Distinct, outline attached Exudate Amount: Medium Exudate Type: Serosanguineous Exudate Color: red, brown Structures Foul Odor After Cleansing: No Slough/Fibrino Yes Wound Bed Granulation Amount: Large (67-100%) Exposed Structure Granulation Quality: Red Fascia Exposed: No Necrotic Amount: None Present (0%) Fat Layer (Subcutaneous Tissue) Exposed: Yes Tendon Exposed: No Muscle Exposed: No Joint Exposed: No Bone Exposed: No Periwound Skin Texture Texture Color No Abnormalities Noted: No No Abnormalities Noted: Yes Callus: Yes Temperature / Pain Temperature: No Abnormality Moisture No Abnormalities Noted: Yes Treatment Notes Wound #1 (Metatarsal head second) Wound Laterality: Right  Cleanser [address] JONAHTAN, MANSEAU ([id number]) [phone].pdf Page 7 of 10 Discharge Instruction: Cleanse the wound with Normal Saline prior to applying a clean dressing using gauze sponges, not tissue or cotton balls. Soap and Water Discharge Instruction: May shower and wash wound with dial antibacterial soap and water prior to dressing change. Wound Cleanser Discharge Instruction: Cleanse the wound with wound cleanser prior to applying a clean dressing using gauze sponges, not tissue or cotton balls. Peri-Wound Care Topical Primary Dressing Promogran Prisma Matrix, 4.34 (sq in) (silver collagen) Discharge Instruction: Moisten collagen with saline or hydrogel Secondary Dressing Woven Gauze Sponge, Non-Sterile 4x4 in Discharge Instruction: Apply over primary dressing as directed. Secured With Conforming Stretch Gauze Bandage, Sterile 2x75 (in/in) Discharge Instruction: Secure with stretch gauze as directed. Compression Wrap Compression Stockings Add-Ons Electronic Signature(s) Signed: 08/19/2023 5:49:41 PM By: Eduardo Deed RN, BSN Previous Signature: 08/19/2023 1:11:09 PM Version By: Eduardo [name] Entered By: Eduardo [name] on  08/19/2023 10:26:35 -------------------------------------------------------------------------------- Wound Assessment Details Patient Name: Date of Service: Eduardo Ruder W. 08/19/2023 1:00 PM Medical Record Number: [id number] Patient Account Number: [id number] Date of Birth/Sex: Treating RN: 1971/05/17 (52 y.o. M) Primary Care Ragen Laver: Eduardo [name] Other Clinician: Referring Elleanor Guyett: Treating Melbert Botelho/Extender: Eduardo [name] Weeks in Treatment: 4 Wound Status Wound Number: 2 Primary Neuropathic Ulcer-Non Diabetic Etiology: Wound Location: Left Metatarsal head first Wound Open Wounding Event: Gradually Appeared Status: Date Acquired: 05/20/2023 Comorbid Lymphedema, Chronic Obstructive Pulmonary Disease (COPD), Weeks Of Treatment: 4 History: Sleep Apnea, Deep Vein Thrombosis, Peripheral Arterial Disease, Clustered Wound: No Osteoarthritis, Neuropathy Photos Wound Measurements Length: (cm) 0.3 Width: (cm) 0.3 Eduardo [name] ([id number]) Depth: (cm) 0.1 Area: (cm) 0.071 Volume: (cm) 0.007 % Reduction in Area: 83.9% % Reduction in Volume: 92% [id number] Page 8 of 10 Epithelialization: Small (1-33%) Tunneling: No Undermining: No Wound Description Classification: Full Thickness Without Exposed Support Structures Wound Margin: Distinct, outline attached Exudate Amount: Medium Exudate Type: Serosanguineous Exudate Color: red, brown Foul Odor After Cleansing: No Slough/Fibrino Yes Wound Bed Granulation Amount: Large (67-100%) Exposed Structure Granulation Quality: Red, Pink Fascia Exposed: No Necrotic Amount: None Present (0%) Fat Layer (Subcutaneous Tissue) Exposed: Yes Tendon Exposed: No Muscle Exposed: No Joint Exposed: No Bone Exposed: No Periwound Skin Texture Texture Color No Abnormalities Noted: No No Abnormalities Noted: Yes Callus: Yes Temperature / Pain Temperature: No Abnormality Moisture No  Abnormalities Noted: Yes Treatment Notes Wound #2 (Metatarsal head first) Wound Laterality: Left Cleanser Normal Saline Discharge Instruction: Cleanse the wound with Normal Saline prior to applying a clean dressing using gauze sponges, not tissue or cotton balls. Soap and Water Discharge Instruction: May shower and wash wound with dial antibacterial soap and water prior to dressing change. Wound Cleanser Discharge Instruction: Cleanse the wound with wound cleanser prior to applying a clean dressing using gauze sponges, not tissue or cotton balls. Peri-Wound Care Topical Primary Dressing Promogran Prisma Matrix, 4.34 (sq in) (silver collagen) Discharge Instruction: Moisten collagen with saline or hydrogel Secondary Dressing Woven Gauze Sponge, Non-Sterile 4x4 in Discharge Instruction: Apply over primary dressing as directed. Secured With Conforming Stretch Gauze Bandage, Sterile 2x75 (in/in) Discharge Instruction: Secure with stretch gauze as directed. Compression Wrap Compression Stockings Add-Ons Electronic Signature(s) Signed: 08/19/2023 5:49:41 PM By: Eduardo Deed RN, BSN Previous Signature: 08/19/2023 1:11:09 PM Version By: Eduardo [name] Entered By: Eduardo [name] on 08/19/2023 10:27:39 Eduardo [name] ([id number]) [id number] Page 9 of 10 -------------------------------------------------------------------------------- Wound Assessment Details Patient Name: Date of Service: LIV Loney Loh DLEY  W. 08/19/2023 1:00 PM Medical Record Number: [id number] Patient Account Number: [id number] Date of Birth/Sex: Treating RN: 1971/07/02 (52 y.o. M) Primary Care Penney Domanski: Eduardo [name] Other Clinician: Referring Tonia Avino: Treating Nycholas Rayner/Extender: Eduardo [name] Weeks in Treatment: 4 Wound Status Wound Number: 3 Primary Neuropathic Ulcer-Non Diabetic Etiology: Wound Location: Right T - Web between 1st and 2nd oe Wound  Open Wounding Event: Gradually Appeared Status: Date Acquired: 07/29/2023 Comorbid Lymphedema, Chronic Obstructive Pulmonary Disease (COPD), Weeks Of Treatment: 2 History: Sleep Apnea, Deep Vein Thrombosis, Peripheral Arterial Disease, Clustered Wound: No Osteoarthritis, Neuropathy Photos Wound Measurements Length: (cm) 0.8 Width: (cm) 0.1 Depth: (cm) 0.1 Area: (cm) 0.063 Volume: (cm) 0.006 % Reduction in Area: 20.3% % Reduction in Volume: 25% Epithelialization: Medium (34-66%) Tunneling: No Undermining: No Wound Description Classification: Full Thickness Without Exposed Support Structures Wound Margin: Distinct, outline attached Exudate Amount: Medium Exudate Type: Serosanguineous Exudate Color: red, brown Foul Odor After Cleansing: No Slough/Fibrino Yes Wound Bed Granulation Amount: Large (67-100%) Exposed Structure Granulation Quality: Red Fascia Exposed: No Necrotic Amount: None Present (0%) Fat Layer (Subcutaneous Tissue) Exposed: Yes Tendon Exposed: No Muscle Exposed: No Joint Exposed: No Bone Exposed: No Periwound Skin Texture Texture Color No Abnormalities Noted: No No Abnormalities Noted: Yes Callus: Yes Temperature / Pain Temperature: No Abnormality Moisture No Abnormalities Noted: Yes Treatment Notes Wound #3 (Toe - Web between 1st and 2nd) Wound Laterality: Right Cleanser Soap and Water Discharge Instruction: May shower and wash wound with dial antibacterial soap and water prior to dressing change. Wound Cleanser Discharge Instruction: Cleanse the wound with wound cleanser prior to applying a clean dressing using gauze sponges, not tissue or cotton balls. Peri-Wound Care ROBY, DONAWAY ([id number]) [phone].pdf Page 10 of 10 Topical Primary Dressing Secondary Dressing Woven Gauze Sponge, Non-Sterile 4x4 in Discharge Instruction: Apply over primary dressing as directed. Secured With Conforming Stretch Gauze Bandage,  Sterile 2x75 (in/in) Discharge Instruction: Secure with stretch gauze as directed. Compression Wrap Compression Stockings Add-Ons Electronic Signature(s) Signed: 08/19/2023 5:49:41 PM By: Eduardo Deed RN, BSN Previous Signature: 08/19/2023 1:11:09 PM Version By: Eduardo [name] Entered By: Eduardo [name] on 08/19/2023 10:28:25 -------------------------------------------------------------------------------- Vitals Details Patient Name: Date of Service: Eduardo Ruder W. 08/19/2023 1:00 PM Medical Record Number: [id number] Patient Account Number: [id number] Date of Birth/Sex: Treating RN: [birth year] (52 y.o. M) Primary Care Timisha Mondry: Eduardo [name] Other Clinician: Referring Conway Fedora: Treating Eshan Trupiano/Extender: Eduardo [name] Weeks in Treatment: 4 Vital Signs Time Taken: 01:03 Temperature (F): 97.8 Height (in): 77 Pulse (bpm): 70 Weight (lbs): 330 Respiratory Rate (breaths/min): 18 Body Mass Index (BMI): 39.1 Blood Pressure (mmHg): 133/84 Reference Range: 80 - 120 mg / dl Electronic Signature(s) Signed: 08/19/2023 5:49:41 PM By: Eduardo Deed RN, BSN Previous Signature: 08/19/2023 1:11:09 PM Version By: Eduardo [name] Entered By: Eduardo [name] on 08/19/2023 10:24:35

## 2023-08-20 ENCOUNTER — Other Ambulatory Visit: Payer: Self-pay

## 2023-08-20 DIAGNOSIS — L97512 Non-pressure chronic ulcer of other part of right foot with fat layer exposed: Secondary | ICD-10-CM

## 2023-08-20 NOTE — Progress Notes (Signed)
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 the procedure. Post Debridement Measurements: 1.1cm length x 0.9cm width x 0.1cm depth; 0.078cm^3 volume. Character of Wound/Ulcer Post Debridement is improved. 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 There was a Excisional  Skin/Subcutaneous Tissue Debridement with a total area of 0.2 sq cm performed by Duanne Guess, MD. With the following instrument(s): Curette to remove Viable and Non- Viable tissue/material. Material removed includes Callus, Subcutaneous Tissue, Slough, and Skin: Epidermis. No specimens were taken. A time out was conducted at 13:45, prior to the start of the procedure. A Minimum amount of bleeding was controlled with Pressure. The procedure was tolerated well with  a pain level of 0 throughout and a pain level of 0 following the procedure. Post Debridement Measurements: 0.5cm length x 0.5cm width x 0.1cm depth; 0.02cm^3 volume. Character of Wound/Ulcer Post Debridement is improved. 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 There was a Excisional oe Skin/Subcutaneous Tissue Debridement with a total area of 0.07 sq cm performed by Duanne Guess, MD. With the following instrument(s): Curette to remove Viable and Non-Viable tissue/material. Material removed includes Callus, Subcutaneous Tissue, and Skin: Epidermis. No specimens were taken. A time out was conducted at 13:45, prior to the start of the procedure. A Minimum amount of bleeding was controlled with Pressure. The procedure was tolerated well with a pain level of 0 throughout and a pain level of 0 following the procedure. Post Debridement Measurements: 0.8cm length x 0.1cm width x 0.1cm depth; 0.006cm^3 volume. Character of Wound/Ulcer Post Debridement is improved. Post procedure Diagnosis Wound #3: Same as Pre-Procedure NIAL, HAWE ([id number]) [phone].pdf Page 9 of 11 Plan Follow-up Appointments: Return Appointment in 1 week. - Dr. Lady Gary - room 2 Anesthetic: (In clinic) Topical Lidocaine 4% applied to wound bed Bathing/ Shower/ Hygiene: May shower  and wash wound with soap and water. - with dressing changes Off-Loading: Open toe surgical shoe to: - to both feet with peg assist inserts Additional Orders / Instructions: Stop/Decrease Smoking WOUND #1: - Metatarsal head second Wound Laterality: Right Cleanser: Normal Saline 1 x Per Day/30 Days Discharge Instructions: Cleanse the wound with Normal Saline prior to applying a clean dressing using gauze sponges, not tissue or cotton balls. Cleanser: Soap and Water 1 x Per Day/30 Days Discharge Instructions: May shower and wash wound with dial antibacterial soap and water prior to dressing change. Cleanser: Wound Cleanser 1 x Per Day/30 Days Discharge Instructions: Cleanse the wound with wound cleanser prior to applying a clean dressing using gauze sponges, not tissue or cotton balls. Prim Dressing: Promogran Prisma Matrix, 4.34 (sq in) (silver collagen) 1 x Per Day/30 Days ary Discharge Instructions: Moisten collagen with saline or hydrogel Secondary Dressing: Woven Gauze Sponge, Non-Sterile 4x4 in 1 x Per Day/30 Days Discharge Instructions: Apply over primary dressing as directed. Secured With: Insurance underwriter, Sterile 2x75 (in/in) 1 x Per Day/30 Days Discharge Instructions: Secure with stretch gauze as directed. WOUND #2: - Metatarsal head first Wound Laterality: Left Cleanser: Normal Saline 1 x Per Day/30 Days Discharge Instructions: Cleanse the wound with Normal Saline prior to applying a clean dressing using gauze sponges, not tissue or cotton balls. Cleanser: Soap and Water 1 x Per Day/30 Days Discharge Instructions: May shower and wash wound with dial antibacterial soap and water prior to dressing change. Cleanser: Wound Cleanser 1 x Per Day/30 Days Discharge Instructions: Cleanse the wound with wound cleanser prior to applying a clean dressing using gauze sponges, not tissue or cotton balls. Prim Dressing: Promogran Prisma Matrix, 4.34 (sq in) (silver collagen) 1 x  Per Day/30 Days ary Discharge Instructions: Moisten collagen with saline or hydrogel Secondary Dressing: Woven Gauze Sponge, Non-Sterile 4x4 in 1 x Per Day/30 Days Discharge Instructions: Apply over primary dressing as directed. Secured With: Insurance underwriter, Sterile 2x75 (in/in) 1 x Per Day/30 Days Discharge Instructions: Secure with stretch gauze as directed. WOUND #3: - T - Web between 1st and 2nd Wound Laterality: Right oe Cleanser: Soap and Water 1 x Per  Day/30 Days Discharge Instructions: May shower and wash wound with dial antibacterial soap and water prior to dressing change. Cleanser: Wound Cleanser 1 x Per Day/30 Days Discharge Instructions: Cleanse the wound with wound cleanser prior to applying a clean dressing using gauze sponges, not tissue or cotton balls. Secondary Dressing: Woven Gauze Sponge, Non-Sterile 4x4 in 1 x Per Day/30 Days Discharge Instructions: Apply over primary dressing as directed. Secured With: Insurance underwriter, Sterile 2x75 (in/in) 1 x Per Day/30 Days Discharge Instructions: Secure with stretch gauze as directed. 08/19/2023: Both plantar ulcers are measuring about the same, but on visual inspection they look smaller. The crack between his toes is only open at the most plantar aspect at this point. There is less accumulation of callus at all sites, although it is still present. He is not wearing his peg assist sandals. I used a curette to debride callus, slough, and subcutaneous tissue from each of his wounds, although there was no slough debrided from the webspace wound. Given how dry the wounds appear, I am going to change his contact layer to Prisma silver collagen. He was encouraged to wear his peg assist offloading inserts and sandals. I am going to see if his insurance might pay for a skin substitute, although he is not diabetic; I think he would benefit from the use of Epicord or similar product. He will follow-up in 1  week. Electronic Signature(s) Signed: 08/19/2023 2:18:02 PM By: Duanne Guess MD FACS Entered By: Duanne Guess on 08/19/2023 11:18:02 -------------------------------------------------------------------------------- HxROS Details Patient Name: Date of Service: [name] 08/19/2023 1:00 PM Medical Record Number: [id number] Patient Account Number: [id number] Date of Birth/Sex: Treating RN: April 22, 1971 (52 y.o. M) Primary Care Provider: Saralyn Pilar Other Clinician: Referring Provider: Treating Provider/Extender: Park Liter in Treatment: 4 DUAINE, RADIN ([id number]) [id number] Page 10 of 11 Information Obtained From Patient Chart Constitutional Symptoms (General Health) Medical History: Past Medical History Notes: MRSA Hematologic/Lymphatic Medical History: Positive for: Lymphedema Respiratory Medical History: Positive for: Chronic Obstructive Pulmonary Disease (COPD); Sleep Apnea Past Medical History Notes: Pulmonary embolus Cardiovascular Medical History: Positive for: Deep Vein Thrombosis; Peripheral Arterial Disease Negative for: Hypertension Gastrointestinal Medical History: Past Medical History Notes: GERD (gastroesophageal reflux disease) Endocrine Medical History: Negative for: Type I Diabetes; Type II Diabetes Integumentary (Skin) Medical History: Past Medical History Notes: Pruritus Pilonidal cyst stasis dermatitis Musculoskeletal Medical History: Positive for: Osteoarthritis Past Medical History Notes: Chronic back pain Degenerative disk disease Neurologic Medical History: Positive for: Neuropathy Psychiatric Medical History: Past Medical History Notes: Anxiety Depression opiate dependence benzodiazepine dependence Immunizations Pneumococcal Vaccine: Received Pneumococcal Vaccination: No Implantable Devices None Hospitalization / Surgery History Type of  Hospitalization/Surgery Mass excision (Right) T arthroplasty (Right) JANIE, STROTHMAN ([id number]) [phone].pdf Page 11 of 11 Nasal septoplasty w/ turbinoplasty Sinus endo with fusion Wrist fusion Thoracic outlet surgery Irrigation and debridement of back abscess Family and Social History Cancer: Yes - Siblings; Diabetes: Yes - Mother; Heart Disease: Yes - Father; Hereditary Spherocytosis: No; Hypertension: Yes - Father; Kidney Disease: No; Lung Disease: No; Seizures: No; Stroke: No; Thyroid Problems: Yes - Mother; Tuberculosis: No; Current every day smoker - 1 ppd; Marital Status - Divorced; Alcohol Use: Rarely; Drug Use: Prior History; Caffeine Use: Daily; Financial Concerns: No; Food, Clothing or Shelter Needs: No; Support System Lacking: No; Transportation Concerns: No Electronic Signature(s) Signed: 08/19/2023 3:34:18 PM By: Duanne Guess MD FACS Entered By: Duanne Guess on 08/19/2023 11:15:58 -------------------------------------------------------------------------------- SuperBill Details Patient Name:  Date of Service: Eduardo [name] 08/19/2023 Medical Record Number: [id number] Patient Account Number: [id number] Date of Birth/Sex: Treating RN: 1970/11/25 (52 y.o. M) Primary Care Provider: Saralyn Pilar Other Clinician: Referring Provider: Treating Provider/Extender: Harrie Jeans Weeks in Treatment: 4 Diagnosis Coding ICD-10 Codes Code Description [phone] Non-pressure chronic ulcer of other part of right foot with fat layer exposed L97.522 Non-pressure chronic ulcer of other part of left foot with fat layer exposed G90.09 Other idiopathic peripheral autonomic neuropathy Z72.0 Tobacco use E66.01 Morbid (severe) obesity due to excess calories Facility Procedures : CPT4 Code: [id number] Description: 11042 - DEB SUBQ TISSUE 20 SQ CM/< ICD-10 Diagnosis Description L97.512 Non-pressure chronic ulcer of other  part of right foot with fat layer exposed L97.522 Non-pressure chronic ulcer of other part of left foot with fat layer exposed Modifier: Quantity: 1 Physician Procedures : CPT4 Code Description Modifier [id number] - WC PHYS LEVEL 3 - EST PT ICD-10 Diagnosis Description L97.512 Non-pressure chronic ulcer of other part of right foot with fat layer exposed L97.522 Non-pressure chronic ulcer of other part of left foot  with fat layer exposed G90.09 Other idiopathic peripheral autonomic neuropathy Z72.0 Tobacco use Quantity: 1 : [id number] 11042 - WC PHYS SUBQ TISS 20 SQ CM ICD-10 Diagnosis Description L97.512 Non-pressure chronic ulcer of other part of right foot with fat layer exposed L97.522 Non-pressure chronic ulcer of other part of left foot with fat layer exposed Quantity: 1 Electronic Signature(s) Signed: 08/19/2023 2:19:01 PM By: Duanne Guess MD FACS Entered By: Duanne Guess on 08/19/2023 11:19:01

## 2023-08-26 ENCOUNTER — Ambulatory Visit (INDEPENDENT_AMBULATORY_CARE_PROVIDER_SITE_OTHER): Payer: Medicaid Other | Admitting: Podiatry

## 2023-08-26 DIAGNOSIS — Z91199 Patient's noncompliance with other medical treatment and regimen due to unspecified reason: Secondary | ICD-10-CM

## 2023-08-26 NOTE — Progress Notes (Signed)
No show

## 2023-08-27 ENCOUNTER — Encounter (HOSPITAL_BASED_OUTPATIENT_CLINIC_OR_DEPARTMENT_OTHER): Payer: Medicaid Other | Attending: General Surgery | Admitting: General Surgery

## 2023-08-27 ENCOUNTER — Other Ambulatory Visit: Payer: Self-pay

## 2023-08-27 ENCOUNTER — Ambulatory Visit: Payer: Medicaid Other | Admitting: Family Medicine

## 2023-08-27 DIAGNOSIS — J449 Chronic obstructive pulmonary disease, unspecified: Secondary | ICD-10-CM | POA: Diagnosis not present

## 2023-08-27 DIAGNOSIS — Z86711 Personal history of pulmonary embolism: Secondary | ICD-10-CM | POA: Insufficient documentation

## 2023-08-27 DIAGNOSIS — I739 Peripheral vascular disease, unspecified: Secondary | ICD-10-CM | POA: Diagnosis not present

## 2023-08-27 DIAGNOSIS — Z86718 Personal history of other venous thrombosis and embolism: Secondary | ICD-10-CM | POA: Diagnosis not present

## 2023-08-27 DIAGNOSIS — G9009 Other idiopathic peripheral autonomic neuropathy: Secondary | ICD-10-CM | POA: Insufficient documentation

## 2023-08-27 DIAGNOSIS — I890 Lymphedema, not elsewhere classified: Secondary | ICD-10-CM | POA: Insufficient documentation

## 2023-08-27 DIAGNOSIS — L97512 Non-pressure chronic ulcer of other part of right foot with fat layer exposed: Secondary | ICD-10-CM | POA: Diagnosis not present

## 2023-08-27 DIAGNOSIS — L97522 Non-pressure chronic ulcer of other part of left foot with fat layer exposed: Secondary | ICD-10-CM | POA: Insufficient documentation

## 2023-08-27 DIAGNOSIS — G8929 Other chronic pain: Secondary | ICD-10-CM | POA: Insufficient documentation

## 2023-08-27 DIAGNOSIS — I10 Essential (primary) hypertension: Secondary | ICD-10-CM | POA: Diagnosis not present

## 2023-08-27 DIAGNOSIS — G4730 Sleep apnea, unspecified: Secondary | ICD-10-CM | POA: Insufficient documentation

## 2023-08-27 NOTE — Progress Notes (Signed)
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 change. Cleanser: Wound Cleanser 1 x Per Day/30 Days Discharge Instructions: Cleanse the wound with wound cleanser prior to applying a clean dressing using gauze sponges, not tissue or cotton balls. Prim Dressing: Promogran Prisma Matrix, 4.34 (sq in) (silver collagen) 1 x Per Day/30 Days ary Discharge Instructions: Moisten collagen with saline or hydrogel Secondary Dressing: Woven Gauze Sponge, Non-Sterile 4x4 in 1 x Per Day/30 Days Discharge Instructions: Apply over primary  dressing as directed. Secured With: Insurance underwriter, Sterile 2x75 (in/in) 1 x Per Day/30 Days Discharge Instructions: Secure with stretch  gauze as directed. WOUND #2: - Metatarsal head first Wound Laterality: Left Cleanser: Normal Saline 1 x Per Day/30 Days Discharge Instructions: Cleanse the wound with Normal Saline prior to applying a clean dressing using gauze sponges, not tissue or cotton balls. Cleanser: Soap and Water 1 x Per Day/30 Days Discharge Instructions: May shower and wash wound with dial antibacterial soap and water prior to dressing change. Cleanser: Wound Cleanser 1 x Per Day/30 Days Discharge Instructions: Cleanse the wound with wound cleanser prior to applying a clean dressing using gauze sponges, not tissue or cotton balls. Prim Dressing: Promogran Prisma Matrix, 4.34 (sq in) (silver collagen) 1 x Per Day/30 Days ary Discharge Instructions: Moisten collagen with saline or hydrogel Secondary Dressing: Woven Gauze Sponge, Non-Sterile 4x4 in 1 x Per Day/30 Days Discharge Instructions: Apply over primary dressing as directed. Secured With: Insurance underwriter, Sterile 2x75 (in/in) 1 x Per Day/30 Days Discharge Instructions: Secure with stretch gauze as directed. 08/27/2023: The wound in the crack between his toes has closed. The plantar ulcers both measured smaller. They have some periwound callus accumulation and slough on their surfaces. I used a curette to debride callus, skin, slough, and subcutaneous tissue from each of the plantar foot ulcers. We will continue Prisma silver collagen and peg assist offloading sandals. He asked what he could do to help keep his calluses soft and I recommended that he apply 40% urea cream, which she can get over-the-counter. He will follow-up in 1 week. Electronic Signature(s) Signed: 08/27/2023 10:58:34 AM By: Duanne Guess MD FACS Entered By: Duanne Guess on 08/27/2023  10:58:34 -------------------------------------------------------------------------------- HxROS Details Patient Name: Date of Service: [name] 08/27/2023 9:45 A M Medical Record Number: [id number] Patient Account Number: [id number] TREASURE, LISONBEE ([id number]) [phone].pdf Page 9 of 11 Date of Birth/Sex: Treating RN: 1970/11/03 (52 y.o. M) Primary Care Provider: Saralyn Pilar Other Clinician: Referring Provider: Treating Provider/Extender: Park Liter in Treatment: 5 Information Obtained From Patient Chart Constitutional Symptoms (General Health) Medical History: Past Medical History Notes: MRSA Hematologic/Lymphatic Medical History: Positive for: Lymphedema Respiratory Medical History: Positive for: Chronic Obstructive Pulmonary Disease (COPD); Sleep Apnea Past Medical History Notes: Pulmonary embolus Cardiovascular Medical History: Positive for: Deep Vein Thrombosis; Peripheral Arterial Disease Negative for: Hypertension Gastrointestinal Medical History: Past Medical History Notes: GERD (gastroesophageal reflux disease) Endocrine Medical History: Negative for: Type I Diabetes; Type II Diabetes Integumentary (Skin) Medical History: Past Medical History Notes: Pruritus Pilonidal cyst stasis dermatitis Musculoskeletal Medical History: Positive for: Osteoarthritis Past Medical History Notes: Chronic back pain Degenerative disk disease Neurologic Medical History: Positive for: Neuropathy Psychiatric Medical History: Past Medical History Notes: Anxiety Depression opiate dependence benzodiazepine dependence Immunizations Pneumococcal Vaccine: Received Pneumococcal Vaccination: No Implantable Devices ARTHAR, SICHER ([id number]) [phone].pdf Page 10 of 11 None Hospitalization / Surgery History Type of Hospitalization/Surgery Mass excision (Right) T  arthroplasty (Right) oe Nasal septoplasty w/ turbinoplasty Sinus endo with fusion Wrist fusion Thoracic outlet surgery Irrigation and debridement of back abscess Family and Social History Cancer: Yes - Siblings; Diabetes: Yes - Mother; Heart Disease: Yes - Father; Hereditary Spherocytosis: No; Hypertension: Yes - Father; Kidney Disease: No; Lung Disease: No; Seizures: No; Stroke: No; Thyroid Problems: Yes - Mother; Tuberculosis: No; Current every day smoker - 1 ppd; Marital Status - Divorced; Alcohol Use: Rarely; Drug Use: Prior History; Caffeine Use: Daily; Financial Concerns: No; Food, Clothing or Shelter Needs: No; Support System Lacking: No; Transportation Concerns: No Electronic Signature(s) Signed: 08/27/2023 11:04:04 AM By:  Duanne Guess MD FACS Entered By: Duanne Guess on 08/27/2023 10:56:57 -------------------------------------------------------------------------------- SuperBill Details Patient Name: Date of Service: [name] 08/27/2023 Medical Record Number: [id number] Patient Account Number: [id number] Date of Birth/Sex: Treating RN: 1971/07/18 (52 y.o. M) Primary Care Provider: Saralyn Pilar Other Clinician: Referring Provider: Treating Provider/Extender: Harrie Jeans Weeks in Treatment: 5 Diagnosis Coding ICD-10 Codes Code Description [phone] Non-pressure chronic ulcer of other part of right foot with fat layer exposed L97.522 Non-pressure chronic ulcer of other part of left foot with fat layer exposed G90.09 Other idiopathic peripheral autonomic neuropathy Z72.0 Tobacco use E66.01 Morbid (severe) obesity due to excess calories Facility Procedures : CPT4 Code: [id number] Description: 11042 - DEB SUBQ TISSUE 20 SQ CM/< ICD-10 Diagnosis Description L97.522 Non-pressure chronic ulcer of other part of left foot with fat layer exposed L97.512 Non-pressure chronic ulcer of other part of right foot with fat layer  exposed Modifier: Quantity: 1 Physician Procedures : CPT4 Code Description Modifier [id number] - WC PHYS LEVEL 3 - EST PT ICD-10 Diagnosis Description L97.512 Non-pressure chronic ulcer of other part of right foot with fat layer exposed L97.522 Non-pressure chronic ulcer of other part of left foot  with fat layer exposed G90.09 Other idiopathic peripheral autonomic neuropathy Z72.0 Tobacco use Quantity: 1 : [id number] 11042 - WC PHYS SUBQ TISS 20 SQ CM ICD-10 Diagnosis Description L97.522 Non-pressure chronic ulcer of other part of left foot with fat layer exposed L97.512 Non-pressure chronic ulcer of other part of right foot with fat layer exposed  [name] ([id number]) [id number] Quantity: 1 f Page 11 of 11 Electronic Signature(s) Signed: 08/27/2023 10:58:58 AM By: Duanne Guess MD FACS Entered By: Duanne Guess on 08/27/2023 10:58:58

## 2023-08-27 NOTE — Progress Notes (Signed)
ABOU, PRICHETT ([id number]) [id number] Page 1 of 10 Visit Report for 08/27/2023 Arrival Information Details Patient Name: Date of Service: Eduardo [name] 08/27/2023 9:45 A M Medical Record Number: [id number] Patient Account Number: [id number] Date of Birth/Sex: Treating RN: 1971-08-14 (52 y.o. Dianna Limbo Primary Care Omnia Dollinger: Saralyn Pilar Other Clinician: Referring Ashland Wiseman: Treating Yahayra Geis/Extender: Park Liter in Treatment: 5 Visit Information History Since Last Visit Added or deleted any medications: No Patient Arrived: Ambulatory Any new allergies or adverse reactions: No Arrival Time: 10:07 Had a fall or experienced change in No Accompanied By: self activities of daily living that may affect Transfer Assistance: None risk of falls: Patient Identification Verified: Yes Signs or symptoms of abuse/neglect since last visito No Patient Has Alerts: Yes Hospitalized since last visit: No Patient Alerts: ABIs: R:1.11 L:1.18 9/24 Implantable device outside of the clinic excluding No TBIs: R:1.13 L:1.02 9/24 cellular tissue based products placed in the center since last visit: Has Dressing in Place as Prescribed: Yes Pain Present Now: Yes Electronic Signature(s) Signed: 08/27/2023 12:21:02 PM By: Karie Schwalbe RN Entered By: Karie Schwalbe on 08/27/2023 10:08:18 -------------------------------------------------------------------------------- Encounter Discharge Information Details Patient Name: Date of Service: Eduardo Ruder W. 08/27/2023 9:45 A M Medical Record Number: [id number] Patient Account Number: [id number] Date of Birth/Sex: Treating RN: 1971/06/15 (52 y.o. [name] Primary Care Shahira Fiske: Saralyn Pilar Other Clinician: Referring Gerda Yin: Treating Arlayne Liggins/Extender: Harrie Jeans Weeks in Treatment: 5 Encounter Discharge Information Items Post  Procedure Vitals Discharge Condition: Stable Temperature (F): 98.1 Ambulatory Status: Ambulatory Pulse (bpm): 65 Discharge Destination: Home Respiratory Rate (breaths/min): 16 Transportation: Private Auto Blood Pressure (mmHg): 136/90 Accompanied By: self Schedule Follow-up Appointment: Yes Clinical Summary of Care: Patient Declined Electronic Signature(s) Signed: 08/27/2023 12:10:28 PM By: Samuella Bruin Entered By: Samuella Bruin on 08/27/2023 11:17:07 [name] ([id number]) [id number] Page 2 of 10 -------------------------------------------------------------------------------- Lower Extremity Assessment Details Patient Name: Date of Service: Eduardo [name] 08/27/2023 9:45 A M Medical Record Number: [id number] Patient Account Number: [id number] Date of Birth/Sex: Treating RN: 02-05-1971 (52 y.o. Dianna Limbo Primary Care Jakala Herford: Saralyn Pilar Other Clinician: Referring Lilou Kneip: Treating Krish Bailly/Extender: Harrie Jeans Weeks in Treatment: 5 Edema Assessment Assessed: [Left: No] [Right: No] Edema: [Left: Yes] [Right: Yes] Calf Left: Right: Point of Measurement: From Medial Instep 48.2 cm 48.8 cm Ankle Left: Right: Point of Measurement: From Medial Instep 29.1 cm 30.1 cm Vascular Assessment Pulses: Dorsalis Pedis Palpable: [Left:Yes] [Right:Yes] Extremity colors, hair growth, and conditions: Extremity Color: [Left:Hyperpigmented] [Right:Hyperpigmented] Hair Growth on Extremity: [Left:Yes] [Right:Yes] Temperature of Extremity: [Left:Warm] [Right:Warm] Capillary Refill: [Left:< 3 seconds] [Right:< 3 seconds] Dependent Rubor: [Left:No No] [Right:No No] Electronic Signature(s) Signed: 08/27/2023 12:21:02 PM By: Karie Schwalbe RN Entered By: Karie Schwalbe on 08/27/2023 10:13:41 -------------------------------------------------------------------------------- Multi Wound Chart  Details Patient Name: Date of Service: Eduardo Ruder W. 08/27/2023 9:45 A M Medical Record Number: [id number] Patient Account Number: [id number] Date of Birth/Sex: Treating RN: 1970/12/20 (52 y.o. M) Primary Care Vina Byrd: Saralyn Pilar Other Clinician: Referring Atom Solivan: Treating Marlea Gambill/Extender: Harrie Jeans Weeks in Treatment: 5 Vital Signs Height(in): 77 Pulse(bpm): 65 Weight(lbs): 330 Blood Pressure(mmHg): 136/90 Body Mass Index(BMI): 39.1 Temperature(F): 98.1 Respiratory Rate(breaths/min): 16 [1:Photos:] [id number].pdf Page 3 of 10] Right Metatarsal head second Left Metatarsal head first Right T - Web between 1st and 2nd oe Wound Location: Thermal Burn Gradually Appeared Gradually Appeared Wounding Event: Neuropathic Ulcer-Non Diabetic  Neuropathic Ulcer-Non Diabetic Neuropathic Ulcer-Non Diabetic Primary Etiology: Lymphedema, Chronic Obstructive Lymphedema, Chronic Obstructive Lymphedema, Chronic Obstructive Comorbid History: Pulmonary Disease (COPD), Sleep Pulmonary Disease (COPD), Sleep Pulmonary Disease (COPD), Sleep Apnea, Deep Vein Thrombosis, Apnea, Deep Vein Thrombosis, Apnea, Deep Vein Thrombosis, Peripheral Arterial Disease, Peripheral Arterial Disease, Peripheral Arterial Disease, Osteoarthritis, Neuropathy Osteoarthritis, Neuropathy Osteoarthritis, Neuropathy 04/19/2023 05/20/2023 07/29/2023 Date Acquired: 5 5 4  Weeks of Treatment: Open Open Healed - Epithelialized Wound Status: No No No Wound Recurrence: 1x0.7x0.3 0.3x0.3x0.1 0x0x0 Measurements L x W x D (cm) 0.55 0.071 0 A (cm) : rea 0.165 0.007 0 Volume (cm) : 74.60% 83.90% 100.00% % Reduction in A rea: 62.00% 92.00% 100.00% % Reduction in Volume: Full Thickness Without Exposed Full Thickness Without Exposed Full Thickness Without Exposed Classification: Support Structures Support Structures Support Structures Medium Medium Medium Exudate A  mount: Serosanguineous Serosanguineous Serosanguineous Exudate Type: red, brown red, brown red, brown Exudate Color: Distinct, outline attached Distinct, outline attached Distinct, outline attached Wound Margin: Large (67-100%) Large (67-100%) Large (67-100%) Granulation A mount: Red Red, Pink Pink Granulation Quality: None Present (0%) None Present (0%) None Present (0%) Necrotic A mount: Fat Layer (Subcutaneous Tissue): Yes Fat Layer (Subcutaneous Tissue): Yes Fat Layer (Subcutaneous Tissue): Yes Exposed Structures: Fascia: No Fascia: No Fascia: No Tendon: No Tendon: No Tendon: No Muscle: No Muscle: No Muscle: No Joint: No Joint: No Joint: No Bone: No Bone: No Bone: No Small (1-33%) Small (1-33%) Medium (34-66%) Epithelialization: Debridement - Excisional Debridement - Excisional [name] Debridement: Pre-procedure Verification/Time Out 10:25 10:25 [name] Taken: Callus, Subcutaneous, Slough Callus, Subcutaneous, Slough [name] Tissue Debrided: Skin/Subcutaneous Tissue Skin/Subcutaneous Tissue [name] Level: 0.89 0.2 [name] Debridement A (sq cm): rea Curette Curette [name] Instrument: Minimum Minimum [name] Bleeding: Pressure Pressure [name] Hemostasis A chieved: 0 0 [name] Procedural Pain: 0 0 [name] Post Procedural Pain: Procedure was tolerated well Procedure was tolerated well [name] Debridement Treatment Response: 1.2x0.9x0.3 0.5x0.5x0.2 [name] Post Debridement Measurements L x W x D (cm) 0.254 0.039 [name] Post Debridement Volume: (cm) Callus: Yes Callus: Yes Callus: Yes Periwound Skin Texture: No Abnormalities Noted No Abnormalities Noted No Abnormalities Noted Periwound Skin Moisture: No Abnormalities Noted No Abnormalities Noted No Abnormalities Noted Periwound Skin Color: No Abnormality No Abnormality No Abnormality Temperature: Debridement Debridement [name] Procedures Performed: Treatment Notes Electronic Signature(s) Signed: 08/27/2023 10:54:00 AM By: Duanne Guess MD  FACS Entered By: Duanne Guess on 08/27/2023 10:53:59 -------------------------------------------------------------------------------- Multi-Disciplinary Care Plan Details Patient Name: Date of Service: Eduardo Ruder W. 08/27/2023 9:45 A M Medical Record Number: [id number] Patient Account Number: [id number] Date of Birth/Sex: Treating RN: December 02, 1970 (52 y.o. Joskar, Mccutcheon, Jackson ([id number]) [id number] Page 4 of 10 Primary Care Kamaree Wheatley: Saralyn Pilar Other Clinician: Referring Tasheka Houseman: Treating Makensie Mulhall/Extender: Park Liter in Treatment: 5 Multidisciplinary Care Plan reviewed with physician Active Inactive Peripheral Neuropathy Nursing Diagnoses: Knowledge deficit related to disease process and management of peripheral neurovascular dysfunction Potential alteration in peripheral tissue perfusion (select prior to confirmation of diagnosis) Goals: Patient/caregiver will verbalize understanding of disease process and disease management Date Initiated: 07/22/2023 Target Resolution Date: 09/24/2023 Goal Status: Active Interventions: Assess signs and symptoms of neuropathy upon admission and as needed Provide education on Management of Neuropathy and Related Ulcers Notes: Wound/Skin Impairment Nursing Diagnoses: Impaired tissue integrity Knowledge deficit related to ulceration/compromised skin integrity Goals: Patient will demonstrate a reduced rate of smoking or cessation of smoking Date Initiated: 07/22/2023 Target Resolution Date: 09/24/2023 Goal Status: Active Patient/caregiver will verbalize understanding of skin  care regimen Date Initiated: 07/22/2023 Target Resolution Date: 09/24/2023 Goal Status: Active Interventions: Assess patient/caregiver ability to obtain necessary supplies Assess patient/caregiver ability to perform ulcer/skin care regimen upon admission and as needed Assess  ulceration(s) every visit Provide education on smoking Treatment Activities: Referred to DME Krystalyn Kubota for dressing supplies : 07/22/2023 Skin care regimen initiated : 07/22/2023 Topical wound management initiated : 07/22/2023 Notes: Electronic Signature(s) Signed: 08/27/2023 1:08:45 PM By: Zenaida Deed RN, BSN Entered By: Zenaida Deed on 08/27/2023 10:24:37 -------------------------------------------------------------------------------- Pain Assessment Details Patient Name: Date of Service: Eduardo Ruder W. 08/27/2023 9:45 A M Medical Record Number: [id number] Patient Account Number: [id number] Date of Birth/Sex: Treating RN: Jul 02, 1971 (52 y.o. Dianna Limbo Primary Care Greysen Devino: Saralyn Pilar Other Clinician: Referring Salahuddin Arismendez: Treating Jahkeem Kurka/Extender: Park Liter in Treatment: [address] LEAN, VALLIERE ([id number]) [id number] Page 5 of 10 Location of Pain Severity and Description of Pain Patient Has Paino Yes Site Locations Pain Location: Generalized Pain With Dressing Change: No Duration of the Pain. Constant / Intermittento Constant Rate the pain. Current Pain Level: 7 Worst Pain Level: 10 Least Pain Level: 7 Tolerable Pain Level: 7 Pain Management and Medication Current Pain Management: Medication: Yes Cold Application: No Rest: Yes Massage: No Activity: No T.E.N.S.: No Heat Application: No Leg drop or elevation: No Is the Current Pain Management Adequate: Adequate How does your wound impact your activities of daily livingo Sleep: No Bathing: No Appetite: No Relationship With Others: No Bladder Continence: No Emotions: No Bowel Continence: No Work: No Toileting: No Drive: No Dressing: No Hobbies: No Electronic Signature(s) Signed: 08/27/2023 12:21:02 PM By: Karie Schwalbe RN Entered By: Karie Schwalbe on 08/27/2023  10:10:53 -------------------------------------------------------------------------------- Patient/Caregiver Education Details Patient Name: Date of Service: Eduardo [name] 11/8/2024andnbsp9:45 A M Medical Record Number: [id number] Patient Account Number: [id number] Date of Birth/Gender: Treating RN: May 22, 1971 (52 y.o. [name] Primary Care Physician: Saralyn Pilar Other Clinician: Referring Physician: Treating Physician/Extender: Park Liter in Treatment: 5 Education Assessment Education Provided To: Patient Education Topics Provided Offloading: Methods: Explain/Verbal Responses: Reinforcements needed, State content correctly Wound/Skin Impairment: Methods: Explain/Verbal Responses: Reinforcements needed, State content correctly KEYUR, NOVOTNY ([id number]) [phone].pdf Page 6 of 10 Electronic Signature(s) Signed: 08/27/2023 1:08:45 PM By: Zenaida Deed RN, BSN Entered By: Zenaida Deed on 08/27/2023 10:25:10 -------------------------------------------------------------------------------- Wound Assessment Details Patient Name: Date of Service: Eduardo Ruder W. 08/27/2023 9:45 A M Medical Record Number: [id number] Patient Account Number: [id number] Date of Birth/Sex: Treating RN: Oct 13, 1971 (52 y.o. M) Primary Care Charmagne Buhl: Saralyn Pilar Other Clinician: Referring Nattie Lazenby: Treating Junius Faucett/Extender: Harrie Jeans Weeks in Treatment: 5 Wound Status Wound Number: 1 Primary Neuropathic Ulcer-Non Diabetic Etiology: Wound Location: Right Metatarsal head second Wound Open Wounding Event: Thermal Burn Status: Date Acquired: 04/19/2023 Comorbid Lymphedema, Chronic Obstructive Pulmonary Disease (COPD), Weeks Of Treatment: 5 History: Sleep Apnea, Deep Vein Thrombosis, Peripheral Arterial Disease, Clustered Wound: No Osteoarthritis, Neuropathy Photos Wound  Measurements Length: (cm) 1 Width: (cm) 0.7 Depth: (cm) 0.3 Area: (cm) 0.55 Volume: (cm) 0.165 % Reduction in Area: 74.6% % Reduction in Volume: 62% Epithelialization: Small (1-33%) Tunneling: No Undermining: No Wound Description Classification: Full Thickness Without Exposed Support Wound Margin: Distinct, outline attached Exudate Amount: Medium Exudate Type: Serosanguineous Exudate Color: red, brown Structures Foul Odor After Cleansing: No Slough/Fibrino Yes Wound Bed Granulation Amount: Large (67-100%) Exposed Structure Granulation Quality: Red Fascia Exposed: No Necrotic Amount: None Present (0%) Fat Layer (  Subcutaneous Tissue) Exposed: Yes Tendon Exposed: No Muscle Exposed: No Joint Exposed: No Bone Exposed: No Periwound Skin Texture Texture Color No Abnormalities Noted: No No Abnormalities Noted: Yes Callus: Yes Temperature / Pain Temperature: No [address] FARAAZ, STEC ([id number]) [phone].pdf Page 7 of 10 No Abnormalities Noted: Yes Treatment Notes Wound #1 (Metatarsal head second) Wound Laterality: Right Cleanser Normal Saline Discharge Instruction: Cleanse the wound with Normal Saline prior to applying a clean dressing using gauze sponges, not tissue or cotton balls. Soap and Water Discharge Instruction: May shower and wash wound with dial antibacterial soap and water prior to dressing change. Wound Cleanser Discharge Instruction: Cleanse the wound with wound cleanser prior to applying a clean dressing using gauze sponges, not tissue or cotton balls. Peri-Wound Care Topical Primary Dressing Promogran Prisma Matrix, 4.34 (sq in) (silver collagen) Discharge Instruction: Moisten collagen with saline or hydrogel Secondary Dressing Woven Gauze Sponge, Non-Sterile 4x4 in Discharge Instruction: Apply over primary dressing as directed. Secured With Conforming Stretch Gauze Bandage, Sterile 2x75 (in/in) Discharge  Instruction: Secure with stretch gauze as directed. Compression Wrap Compression Stockings Add-Ons Electronic Signature(s) Signed: 08/27/2023 12:21:02 PM By: Karie Schwalbe RN Entered By: Karie Schwalbe on 08/27/2023 10:14:02 -------------------------------------------------------------------------------- Wound Assessment Details Patient Name: Date of Service: Eduardo Ruder W. 08/27/2023 9:45 A M Medical Record Number: [id number] Patient Account Number: [id number] Date of Birth/Sex: Treating RN: 08/02/1971 (52 y.o. M) Primary Care Jolisa Intriago: Saralyn Pilar Other Clinician: Referring Neelah Mannings: Treating Sydney Hasten/Extender: Harrie Jeans Weeks in Treatment: 5 Wound Status Wound Number: 2 Primary Neuropathic Ulcer-Non Diabetic Etiology: Wound Location: Left Metatarsal head first Wound Open Wounding Event: Gradually Appeared Status: Date Acquired: 05/20/2023 Comorbid Lymphedema, Chronic Obstructive Pulmonary Disease (COPD), Weeks Of Treatment: 5 History: Sleep Apnea, Deep Vein Thrombosis, Peripheral Arterial Disease, Clustered Wound: No Osteoarthritis, Neuropathy Photos KYPTON, ARCIA ([id number]) [id number] Page 8 of 10 Wound Measurements Length: (cm) 0.3 Width: (cm) 0.3 Depth: (cm) 0.1 Area: (cm) 0.071 Volume: (cm) 0.007 % Reduction in Area: 83.9% % Reduction in Volume: 92% Epithelialization: Small (1-33%) Tunneling: No Undermining: No Wound Description Classification: Full Thickness Without Exposed Support Structures Wound Margin: Distinct, outline attached Exudate Amount: Medium Exudate Type: Serosanguineous Exudate Color: red, brown Foul Odor After Cleansing: No Slough/Fibrino Yes Wound Bed Granulation Amount: Large (67-100%) Exposed Structure Granulation Quality: Red, Pink Fascia Exposed: No Necrotic Amount: None Present (0%) Fat Layer (Subcutaneous Tissue) Exposed: Yes Tendon Exposed: No Muscle Exposed:  No Joint Exposed: No Bone Exposed: No Periwound Skin Texture Texture Color No Abnormalities Noted: No No Abnormalities Noted: Yes Callus: Yes Temperature / Pain Temperature: No Abnormality Moisture No Abnormalities Noted: Yes Treatment Notes Wound #2 (Metatarsal head first) Wound Laterality: Left Cleanser Normal Saline Discharge Instruction: Cleanse the wound with Normal Saline prior to applying a clean dressing using gauze sponges, not tissue or cotton balls. Soap and Water Discharge Instruction: May shower and wash wound with dial antibacterial soap and water prior to dressing change. Wound Cleanser Discharge Instruction: Cleanse the wound with wound cleanser prior to applying a clean dressing using gauze sponges, not tissue or cotton balls. Peri-Wound Care Topical Primary Dressing Promogran Prisma Matrix, 4.34 (sq in) (silver collagen) Discharge Instruction: Moisten collagen with saline or hydrogel Secondary Dressing Woven Gauze Sponge, Non-Sterile 4x4 in Discharge Instruction: Apply over primary dressing as directed. Secured With Conforming Stretch Gauze Bandage, Sterile 2x75 (in/in) Discharge Instruction: Secure with stretch gauze as directed. Compression [address] VINCENT, BRIDWELL ([id number]) [id number] Page 9 of 10 Compression Stockings  Add-Ons Electronic Signature(s) Signed: 08/27/2023 12:21:02 PM By: Karie Schwalbe RN Entered By: Karie Schwalbe on 08/27/2023 10:14:27 -------------------------------------------------------------------------------- Wound Assessment Details Patient Name: Date of Service: Eduardo Ruder W. 08/27/2023 9:45 A M Medical Record Number: [id number] Patient Account Number: [id number] Date of Birth/Sex: Treating RN: 1971-01-12 (52 y.o. [name] Primary Care Zipporah Finamore: Saralyn Pilar Other Clinician: Referring Gali Spinney: Treating Tekeyah Santiago/Extender: Harrie Jeans Weeks in  Treatment: 5 Wound Status Wound Number: 3 Primary Neuropathic Ulcer-Non Diabetic Etiology: Wound Location: Right T - Web between 1st and 2nd oe Wound Healed - Epithelialized Wounding Event: Gradually Appeared Status: Date Acquired: 07/29/2023 Comorbid Lymphedema, Chronic Obstructive Pulmonary Disease (COPD), Weeks Of Treatment: 4 History: Sleep Apnea, Deep Vein Thrombosis, Peripheral Arterial Disease, Clustered Wound: No Osteoarthritis, Neuropathy Photos Wound Measurements Length: (cm) Width: (cm) Depth: (cm) Area: (cm) Volume: (cm) 0 % Reduction in Area: 100% 0 % Reduction in Volume: 100% 0 Epithelialization: Medium (34-66%) 0 Tunneling: No 0 Undermining: No Wound Description Classification: Full Thickness Without Exposed Support Wound Margin: Distinct, outline attached Exudate Amount: Medium Exudate Type: Serosanguineous Exudate Color: red, brown Structures Foul Odor After Cleansing: No Slough/Fibrino No Wound Bed Granulation Amount: Large (67-100%) Exposed Structure Granulation Quality: Pink Fascia Exposed: No Necrotic Amount: None Present (0%) Fat Layer (Subcutaneous Tissue) Exposed: Yes Tendon Exposed: No Muscle Exposed: No Joint Exposed: No Bone Exposed: No Periwound Skin Texture Texture Color No Abnormalities Noted: No No Abnormalities Noted: Yes Callus: Yes [name] ([id number]) [phone].pdf Page 10 of 10 Callus: Yes Temperature / Pain Temperature: No Abnormality Moisture No Abnormalities Noted: Yes Treatment Notes Wound #3 (Toe - Web between 1st and 2nd) Wound Laterality: Right Cleanser Peri-Wound Care Topical Primary Dressing Secondary Dressing Secured With Compression Wrap Compression Stockings Add-Ons Electronic Signature(s) Signed: 08/27/2023 1:08:45 PM By: Zenaida Deed RN, BSN Entered By: Zenaida Deed on 08/27/2023  10:30:17 -------------------------------------------------------------------------------- Vitals Details Patient Name: Date of Service: Eduardo Ruder W. 08/27/2023 9:45 A M Medical Record Number: [id number] Patient Account Number: [id number] Date of Birth/Sex: Treating RN: July 08, 1971 (52 y.o. Dianna Limbo Primary Care Allegra Cerniglia: Saralyn Pilar Other Clinician: Referring Jery Hollern: Treating Rosella Crandell/Extender: Harrie Jeans Weeks in Treatment: 5 Vital Signs Time Taken: 10:10 Temperature (F): 98.1 Height (in): 77 Pulse (bpm): 65 Weight (lbs): 330 Respiratory Rate (breaths/min): 16 Body Mass Index (BMI): 39.1 Blood Pressure (mmHg): 136/90 Reference Range: 80 - 120 mg / dl Electronic Signature(s) Signed: 08/27/2023 12:21:02 PM By: Karie Schwalbe RN Entered By: Karie Schwalbe on 08/27/2023 10:24:56

## 2023-09-03 ENCOUNTER — Encounter (HOSPITAL_BASED_OUTPATIENT_CLINIC_OR_DEPARTMENT_OTHER): Payer: Medicaid Other | Admitting: General Surgery

## 2023-09-03 DIAGNOSIS — L97512 Non-pressure chronic ulcer of other part of right foot with fat layer exposed: Secondary | ICD-10-CM | POA: Diagnosis not present

## 2023-09-03 NOTE — Progress Notes (Signed)
TRAETON, EGLER ([id number]) [id number] Page 1 of 10 Visit Report for 09/03/2023 Arrival Information Details Patient Name: Date of Service: Eduardo [name] 09/03/2023 10:00 A M Medical Record Number: [id number] Patient Account Number: [id number] Date of Birth/Sex: Treating RN: Oct 25, 1970 (52 y.o. [name] Primary Care Nitesh Pitstick: Saralyn Pilar Other Clinician: Referring Tyshauna Finkbiner: Treating Prosper Paff/Extender: Park Liter in Treatment: 6 Visit Information History Since Last Visit Added or deleted any medications: No Patient Arrived: Ambulatory Any new allergies or adverse reactions: No Arrival Time: 10:11 Had a fall or experienced change in No Accompanied By: self activities of daily living that may affect Transfer Assistance: None risk of falls: Patient Identification Verified: Yes Signs or symptoms of abuse/neglect since last visito No Secondary Verification Process Completed: Yes Hospitalized since last visit: No Patient Requires Transmission-Based Precautions: No Implantable device outside of the clinic excluding No Patient Has Alerts: Yes cellular tissue based products placed in the center Patient Alerts: ABIs: R:1.11 L:1.18 9/24 since last visit: TBIs: R:1.13 L:1.02 9/24 Has Dressing in Place as Prescribed: Yes Pain Present Now: Yes Electronic Signature(s) Signed: 09/03/2023 1:02:24 PM By: Zenaida Deed RN, BSN Entered By: Zenaida Deed on 09/03/2023 07:12:41 -------------------------------------------------------------------------------- Encounter Discharge Information Details Patient Name: Date of Service: Eduardo Ruder W. 09/03/2023 10:00 A M Medical Record Number: [id number] Patient Account Number: [id number] Date of Birth/Sex: Treating RN: Mar 14, 1971 (52 y.o. Dianna Limbo Primary Care Janique Hoefer: Saralyn Pilar Other Clinician: Referring Joydan Gretzinger: Treating  Philena Obey/Extender: Park Liter in Treatment: 6 Encounter Discharge Information Items Post Procedure Vitals Discharge Condition: Stable Temperature (F): 98 Ambulatory Status: Ambulatory Pulse (bpm): 66 Discharge Destination: Home Respiratory Rate (breaths/min): 20 Transportation: Private Auto Blood Pressure (mmHg): 143/88 Accompanied By: self Schedule Follow-up Appointment: Yes Clinical Summary of Care: Patient Declined Electronic Signature(s) Signed: 09/03/2023 1:08:41 PM By: Karie Schwalbe RN Entered By: Karie Schwalbe on 09/03/2023 07:47:39 Eduardo [name] ([id number]) [id number] Page 2 of 10 -------------------------------------------------------------------------------- Lower Extremity Assessment Details Patient Name: Date of Service: Eduardo [name] 09/03/2023 10:00 A M Medical Record Number: [id number] Patient Account Number: [id number] Date of Birth/Sex: Treating RN: Dec 15, 1970 (52 y.o. [name] Primary Care Lindora Alviar: Saralyn Pilar Other Clinician: Referring Tedi Hughson: Treating Marquett Bertoli/Extender: Harrie Jeans Weeks in Treatment: 6 Edema Assessment Assessed: [Left: No] [Right: No] Edema: [Left: Yes] [Right: Yes] Calf Left: Right: Point of Measurement: From Medial Instep 48.2 cm 48.8 cm Ankle Left: Right: Point of Measurement: From Medial Instep 29.1 cm 30.1 cm Vascular Assessment Pulses: Dorsalis Pedis Palpable: [Left:No] [Right:No] Extremity colors, hair growth, and conditions: Extremity Color: [Left:Hyperpigmented] [Right:Hyperpigmented] Hair Growth on Extremity: [Left:Yes] [Right:Yes] Temperature of Extremity: [Left:Warm] [Right:Warm] Capillary Refill: [Left:< 3 seconds] [Right:< 3 seconds] Dependent Rubor: [Left:No No] [Right:No No] Electronic Signature(s) Signed: 09/03/2023 1:02:24 PM By: Zenaida Deed RN, BSN Entered By: Zenaida Deed on 09/03/2023  07:16:09 -------------------------------------------------------------------------------- Multi Wound Chart Details Patient Name: Date of Service: Eduardo Ruder W. 09/03/2023 10:00 A M Medical Record Number: [id number] Patient Account Number: [id number] Date of Birth/Sex: Treating RN: 03-17-1971 (52 y.o. M) Primary Care Kayvan Hoefling: Saralyn Pilar Other Clinician: Referring Itha Kroeker: Treating Sevannah Madia/Extender: Harrie Jeans Weeks in Treatment: 6 Vital Signs Height(in): 77 Pulse(bpm): 66 Weight(lbs): 330 Blood Pressure(mmHg): 143/88 Body Mass Index(BMI): 39.1 Temperature(F): 98.0 Respiratory Rate(breaths/min): 20 [1:Photos:] [[name]:[name] [id number] Page 3 of 10] Right Metatarsal head second Left Metatarsal head first [name] Wound Location: Thermal Burn Gradually Appeared [name]  Wounding Event: Neuropathic Ulcer-Non Diabetic Neuropathic Ulcer-Non Diabetic [name] Primary Etiology: Lymphedema, Chronic Obstructive Lymphedema, Chronic Obstructive [name] Comorbid History: Pulmonary Disease (COPD), Sleep Pulmonary Disease (COPD), Sleep Apnea, Deep Vein Thrombosis, Apnea, Deep Vein Thrombosis, Peripheral Arterial Disease, Peripheral Arterial Disease, Osteoarthritis, Neuropathy Osteoarthritis, Neuropathy 04/19/2023 05/20/2023 [name] Date Acquired: 6 6 [name] Weeks of Treatment: Open Open [name] Wound Status: No No [name] Wound Recurrence: 0.8x0.7x0.3 0.2x0.2x0.2 [name] Measurements L x W x D (cm) 0.44 0.031 [name] A (cm) : rea 0.132 0.006 [name] Volume (cm) : 79.70% 93.00% [name] % Reduction in A rea: 69.60% 93.20% [name] % Reduction in Volume: 1 12 Starting Position 1 (o'clock): 9 12 Ending Position 1 (o'clock): 0.3 0.2 Maximum Distance 1 (cm): Yes Yes [name] Undermining: Full Thickness Without Exposed Full Thickness Without Exposed [name] Classification: Support Structures Support Structures Medium Medium [name] Exudate A mount: Serosanguineous Serosanguineous  [name] Exudate Type: red, brown red, brown [name] Exudate Color: Distinct, outline attached Distinct, outline attached [name] Wound Margin: Large (67-100%) Large (67-100%) [name] Granulation A mount: Red, Pale Red, Pink [name] Granulation Quality: Small (1-33%) None Present (0%) [name] Necrotic A mount: Fat Layer (Subcutaneous Tissue): Yes Fat Layer (Subcutaneous Tissue): Yes [name] Exposed Structures: Fascia: No Fascia: No Tendon: No Tendon: No Muscle: No Muscle: No Joint: No Joint: No Bone: No Bone: No Small (1-33%) None [name] Epithelialization: Debridement - Excisional Debridement - Excisional [name] Debridement: Pre-procedure Verification/Time Out 10:29 10:29 [name] Taken: Lidocaine 4% Topical Solution Lidocaine 4% Topical Solution [name] Pain Control: Callus, Subcutaneous, Slough Callus, Subcutaneous, Slough [name] Tissue Debrided: Skin/Subcutaneous Tissue Skin/Subcutaneous Tissue [name] Level: 0.44 0.03 [name] Debridement A (sq cm): rea Curette Curette [name] Instrument: Minimum Minimum [name] Bleeding: Pressure Pressure [name] Hemostasis A chieved: Procedure was tolerated well Procedure was tolerated well [name] Debridement Treatment Response: 0.8x0.7x0.3 0.2x0.2x0.2 [name] Post Debridement Measurements L x W x D (cm) 0.132 0.006 [name] Post Debridement Volume: (cm) Callus: Yes Callus: Yes [name] Periwound Skin Texture: No Abnormalities Noted No Abnormalities Noted [name] Periwound Skin Moisture: No Abnormalities Noted No Abnormalities Noted [name] Periwound Skin Color: No Abnormality No Abnormality [name] Temperature: Debridement Debridement [name] Procedures Performed: Treatment Notes Wound #1 (Metatarsal head second) Wound Laterality: Right Cleanser Normal Saline Discharge Instruction: Cleanse the wound with Normal Saline prior to applying a clean dressing using gauze sponges, not tissue or cotton balls. Soap and Water Discharge Instruction: May shower and wash wound with dial antibacterial soap and water prior  to dressing change. Wound Cleanser Discharge Instruction: Cleanse the wound with wound cleanser prior to applying a clean dressing using gauze sponges, not tissue or cotton balls. Peri-Wound Care Topical Silvadene Cream Discharge Instruction: Apply thin layer to wound bed only HOLDON, CORDNER ([id number]) [phone].pdf Page 4 of 10 Primary Dressing Maxorb Extra Ag+ Alginate Dressing, 2x2 (in/in) Discharge Instruction: Apply to wound bed as instructed Secondary Dressing Woven Gauze Sponge, Non-Sterile 4x4 in Discharge Instruction: Apply over primary dressing as directed. Secured With Conforming Stretch Gauze Bandage, Sterile 2x75 (in/in) Discharge Instruction: Secure with stretch gauze as directed. Compression Wrap Compression Stockings Add-Ons Wound #2 (Metatarsal head first) Wound Laterality: Left Cleanser Normal Saline Discharge Instruction: Cleanse the wound with Normal Saline prior to applying a clean dressing using gauze sponges, not tissue or cotton balls. Soap and Water Discharge Instruction: May shower and wash wound with dial antibacterial soap and water prior to dressing change. Wound Cleanser Discharge Instruction: Cleanse the wound with wound cleanser prior to applying a clean dressing using gauze sponges, not tissue or cotton balls. Peri-Wound  Care Topical Silvadene Cream Discharge Instruction: Apply thin layer to wound bed only Primary Dressing Maxorb Extra Ag+ Alginate Dressing, 2x2 (in/in) Discharge Instruction: Apply to wound bed as instructed Secondary Dressing Woven Gauze Sponge, Non-Sterile 4x4 in Discharge Instruction: Apply over primary dressing as directed. Secured With Conforming Stretch Gauze Bandage, Sterile 2x75 (in/in) Discharge Instruction: Secure with stretch gauze as directed. Compression Wrap Compression Stockings Add-Ons Electronic Signature(s) Signed: 09/03/2023 10:55:41 AM By: Duanne Guess MD FACS Entered  By: Duanne Guess on 09/03/2023 07:55:41 -------------------------------------------------------------------------------- Multi-Disciplinary Care Plan Details Patient Name: Date of Service: Eduardo Ruder W. 09/03/2023 10:00 A M Medical Record Number: [id number] Patient Account Number: [id number] Date of Birth/Sex: Treating RN: Dec 17, 1970 (52 y.o. Eduardo [name] Primary Care Kelty Szafran: Saralyn Pilar Other Clinician: Referring Nomie Buchberger: Treating Camara Renstrom/Extender: Park Liter in Treatment: 6 LARWRENCE, BRAZZEL ([id number]) [id number] Page 5 of 10 Multidisciplinary Care Plan reviewed with physician Active Inactive Peripheral Neuropathy Nursing Diagnoses: Knowledge deficit related to disease process and management of peripheral neurovascular dysfunction Potential alteration in peripheral tissue perfusion (select prior to confirmation of diagnosis) Goals: Patient/caregiver will verbalize understanding of disease process and disease management Date Initiated: 07/22/2023 Target Resolution Date: 09/24/2023 Goal Status: Active Interventions: Assess signs and symptoms of neuropathy upon admission and as needed Provide education on Management of Neuropathy and Related Ulcers Notes: Wound/Skin Impairment Nursing Diagnoses: Impaired tissue integrity Knowledge deficit related to ulceration/compromised skin integrity Goals: Patient will demonstrate a reduced rate of smoking or cessation of smoking Date Initiated: 07/22/2023 Target Resolution Date: 09/24/2023 Goal Status: Active Patient/caregiver will verbalize understanding of skin care regimen Date Initiated: 07/22/2023 Target Resolution Date: 09/24/2023 Goal Status: Active Interventions: Assess patient/caregiver ability to obtain necessary supplies Assess patient/caregiver ability to perform ulcer/skin care regimen upon admission and as needed Assess ulceration(s) every  visit Provide education on smoking Treatment Activities: Referred to DME Lynnie Koehler for dressing supplies : 07/22/2023 Skin care regimen initiated : 07/22/2023 Topical wound management initiated : 07/22/2023 Notes: Electronic Signature(s) Signed: 09/03/2023 11:57:20 AM By: Samuella Bruin Entered By: Samuella Bruin on 09/03/2023 07:32:48 -------------------------------------------------------------------------------- Pain Assessment Details Patient Name: Date of Service: Eduardo Ruder W. 09/03/2023 10:00 A M Medical Record Number: [id number] Patient Account Number: [id number] Date of Birth/Sex: Treating RN: 1971/07/27 (52 y.o. [name] Primary Care Sophi Calligan: Saralyn Pilar Other Clinician: Referring Maciah Feeback: Treating Aleera Gilcrease/Extender: Harrie Jeans Weeks in Treatment: 6 Active Problems Location of Pain Severity and Description of Pain Patient Has Paino Yes Site Locations Pain Location: Eduardo, [name] ([id number]) [id number] Page 6 of 10 Pain Location: Generalized Pain With Dressing Change: No Duration of the Pain. Constant / Intermittento Intermittent Rate the pain. Current Pain Level: 7 Worst Pain Level: 8 Character of Pain Describe the Pain: Aching Pain Management and Medication Current Pain Management: Medication: Yes Is the Current Pain Management Adequate: Adequate How does your wound impact your activities of daily livingo Sleep: No Bathing: No Appetite: No Relationship With Others: No Bladder Continence: No Emotions: No Bowel Continence: No Work: No Toileting: No Drive: No Dressing: No Hobbies: No Notes reports chronic back pain Electronic Signature(s) Signed: 09/03/2023 1:02:24 PM By: Zenaida Deed RN, BSN Entered By: Zenaida Deed on 09/03/2023 07:13:58 -------------------------------------------------------------------------------- Patient/Caregiver Education  Details Patient Name: Date of Service: Eduardo [name] 11/15/2024andnbsp10:00 A M Medical Record Number: [id number] Patient Account Number: [id number] Date of Birth/Gender: Treating RN: 02/16/1971 (52 y.o. Eduardo [name] Primary Care Physician: Saralyn Pilar Other  Clinician: Referring Physician: Treating Physician/Extender: Park Liter in Treatment: 6 Education Assessment Education Provided To: Patient Education Topics Provided Wound/Skin Impairment: Methods: Explain/Verbal Responses: Reinforcements needed, State content correctly Electronic Signature(s) Signed: 09/03/2023 11:57:20 AM By: Samuella Bruin Entered By: Samuella Bruin on 09/03/2023 07:33:14 Eduardo [name] ([id number]) [id number] Page 7 of 10 -------------------------------------------------------------------------------- Wound Assessment Details Patient Name: Date of Service: Eduardo [name] 09/03/2023 10:00 A M Medical Record Number: [id number] Patient Account Number: [id number] Date of Birth/Sex: Treating RN: Mar 10, 1971 (52 y.o. M) Primary Care Sterling Mondo: Saralyn Pilar Other Clinician: Referring Syra Sirmons: Treating Jaylenn Altier/Extender: Harrie Jeans Weeks in Treatment: 6 Wound Status Wound Number: 1 Primary Neuropathic Ulcer-Non Diabetic Etiology: Wound Location: Right Metatarsal head second Wound Open Wounding Event: Thermal Burn Status: Date Acquired: 04/19/2023 Comorbid Lymphedema, Chronic Obstructive Pulmonary Disease (COPD), Weeks Of Treatment: 6 History: Sleep Apnea, Deep Vein Thrombosis, Peripheral Arterial Disease, Clustered Wound: No Osteoarthritis, Neuropathy Photos Wound Measurements Length: (cm) 0.8 Width: (cm) 0.7 Depth: (cm) 0.3 Area: (cm) 0.44 Volume: (cm) 0.132 % Reduction in Area: 79.7% % Reduction in Volume: 69.6% Epithelialization: Small (1-33%) Tunneling: No Undermining:  Yes Starting Position (o'clock): 1 Ending Position (o'clock): 9 Maximum Distance: (cm) 0.3 Wound Description Classification: Full Thickness Without Exposed Support Wound Margin: Distinct, outline attached Exudate Amount: Medium Exudate Type: Serosanguineous Exudate Color: red, brown Structures Foul Odor After Cleansing: No Slough/Fibrino Yes Wound Bed Granulation Amount: Large (67-100%) Exposed Structure Granulation Quality: Red, Pale Fascia Exposed: No Necrotic Amount: Small (1-33%) Fat Layer (Subcutaneous Tissue) Exposed: Yes Necrotic Quality: Adherent Slough Tendon Exposed: No Muscle Exposed: No Joint Exposed: No Bone Exposed: No Periwound Skin Texture Texture Color No Abnormalities Noted: No No Abnormalities Noted: Yes Callus: Yes Temperature / Pain Temperature: No Abnormality Moisture No Abnormalities NotedMARSHAL, Eduardo [name] ([id number]) [id number] Page 8 of 10 Treatment Notes Wound #1 (Metatarsal head second) Wound Laterality: Right Cleanser Normal Saline Discharge Instruction: Cleanse the wound with Normal Saline prior to applying a clean dressing using gauze sponges, not tissue or cotton balls. Soap and Water Discharge Instruction: May shower and wash wound with dial antibacterial soap and water prior to dressing change. Wound Cleanser Discharge Instruction: Cleanse the wound with wound cleanser prior to applying a clean dressing using gauze sponges, not tissue or cotton balls. Peri-Wound Care Topical Silvadene Cream Discharge Instruction: Apply thin layer to wound bed only Primary Dressing Maxorb Extra Ag+ Alginate Dressing, 2x2 (in/in) Discharge Instruction: Apply to wound bed as instructed Secondary Dressing Woven Gauze Sponge, Non-Sterile 4x4 in Discharge Instruction: Apply over primary dressing as directed. Secured With Conforming Stretch Gauze Bandage, Sterile 2x75 (in/in) Discharge Instruction: Secure with stretch  gauze as directed. Compression Wrap Compression Stockings Add-Ons Electronic Signature(s) Signed: 09/03/2023 1:02:24 PM By: Zenaida Deed RN, BSN Entered By: Zenaida Deed on 09/03/2023 07:21:03 -------------------------------------------------------------------------------- Wound Assessment Details Patient Name: Date of Service: Eduardo Ruder W. 09/03/2023 10:00 A M Medical Record Number: [id number] Patient Account Number: [id number] Date of Birth/Sex: Treating RN: Nov 29, 1970 (52 y.o. M) Primary Care Anaysha Andre: Saralyn Pilar Other Clinician: Referring Winford Hehn: Treating Kelvis Berger/Extender: Harrie Jeans Weeks in Treatment: 6 Wound Status Wound Number: 2 Primary Neuropathic Ulcer-Non Diabetic Etiology: Wound Location: Left Metatarsal head first Wound Open Wounding Event: Gradually Appeared Status: Date Acquired: 05/20/2023 Comorbid Lymphedema, Chronic Obstructive Pulmonary Disease (COPD), Weeks Of Treatment: 6 History: Sleep Apnea, Deep Vein Thrombosis, Peripheral Arterial Disease, Clustered Wound: No Osteoarthritis, Neuropathy Photos Eduardo [name], Eduardo [name] ([id number]) [id number] Page  9 of 10 Wound Measurements Length: (cm) 0.2 Width: (cm) 0.2 Depth: (cm) 0.2 Area: (cm) 0.031 Volume: (cm) 0.006 % Reduction in Area: 93% % Reduction in Volume: 93.2% Epithelialization: None Tunneling: No Undermining: Yes Starting Position (o'clock): 12 Ending Position (o'clock): 12 Maximum Distance: (cm) 0.2 Wound Description Classification: Full Thickness Without Exposed Support Structures Wound Margin: Distinct, outline attached Exudate Amount: Medium Exudate Type: Serosanguineous Exudate Color: red, brown Foul Odor After Cleansing: No Slough/Fibrino Yes Wound Bed Granulation Amount: Large (67-100%) Exposed Structure Granulation Quality: Red, Pink Fascia Exposed: No Necrotic Amount: None Present (0%) Fat Layer (Subcutaneous  Tissue) Exposed: Yes Tendon Exposed: No Muscle Exposed: No Joint Exposed: No Bone Exposed: No Periwound Skin Texture Texture Color No Abnormalities Noted: No No Abnormalities Noted: Yes Callus: Yes Temperature / Pain Temperature: No Abnormality Moisture No Abnormalities Noted: Yes Treatment Notes Wound #2 (Metatarsal head first) Wound Laterality: Left Cleanser Normal Saline Discharge Instruction: Cleanse the wound with Normal Saline prior to applying a clean dressing using gauze sponges, not tissue or cotton balls. Soap and Water Discharge Instruction: May shower and wash wound with dial antibacterial soap and water prior to dressing change. Wound Cleanser Discharge Instruction: Cleanse the wound with wound cleanser prior to applying a clean dressing using gauze sponges, not tissue or cotton balls. Peri-Wound Care Topical Silvadene Cream Discharge Instruction: Apply thin layer to wound bed only Primary Dressing Maxorb Extra Ag+ Alginate Dressing, 2x2 (in/in) Discharge Instruction: Apply to wound bed as instructed Secondary Dressing Woven Gauze Sponge, Non-Sterile 4x4 in Discharge Instruction: Apply over primary dressing as directed. Eduardo [name], Eduardo [name] ([id number]) [id number] Page 10 of 10 Secured With Conforming Stretch Gauze Bandage, Sterile 2x75 (in/in) Discharge Instruction: Secure with stretch gauze as directed. Compression Wrap Compression Stockings Add-Ons Electronic Signature(s) Signed: 09/03/2023 1:02:24 PM By: Zenaida Deed RN, BSN Entered By: Zenaida Deed on 09/03/2023 07:21:37 -------------------------------------------------------------------------------- Vitals Details Patient Name: Date of Service: Eduardo Ruder W. 09/03/2023 10:00 A M Medical Record Number: [id number] Patient Account Number: [id number] Date of Birth/Sex: Treating RN: Nov 16, 1970 (52 y.o. [name] Primary Care Riely Baskett: Saralyn Pilar  Other Clinician: Referring Jwan Hornbaker: Treating Lilias Lorensen/Extender: Harrie Jeans Weeks in Treatment: 6 Vital Signs Time Taken: 10:12 Temperature (F): 98.0 Height (in): 77 Pulse (bpm): 66 Weight (lbs): 330 Respiratory Rate (breaths/min): 20 Body Mass Index (BMI): 39.1 Blood Pressure (mmHg): 143/88 Reference Range: 80 - 120 mg / dl Electronic Signature(s) Signed: 09/03/2023 1:02:24 PM By: Zenaida Deed RN, BSN Entered By: Zenaida Deed on 09/03/2023 07:14:40

## 2023-09-03 NOTE — Progress Notes (Signed)
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 clean dressing using gauze sponges, not tissue or cotton balls. Cleanser: Soap and Water 1 x Per Day/30 Days Discharge Instructions: May shower and wash wound with dial antibacterial soap and water prior to dressing change. Cleanser: Wound Cleanser 1 x Per Day/30 Days Discharge Instructions: Cleanse the wound with wound cleanser prior to applying a clean dressing using gauze sponges, not tissue or cotton balls. Topical: Silvadene Cream 1 x Per Day/30  Days Discharge Instructions: Apply thin layer to wound bed only Prim Dressing: Maxorb Extra Ag+ Alginate Dressing, 2x2 (in/in) 1 x Per Day/30 Days ary Discharge Instructions: Apply to wound bed as instructed Secondary Dressing: Woven Gauze Sponge, Non-Sterile 4x4 in 1 x Per Day/30 Days Discharge Instructions:  Apply over primary dressing as directed. Secured With: Insurance underwriter, Sterile 2x75 (in/in) 1 x Per Day/30 Days Discharge Instructions: Secure with stretch gauze as directed. WOUND #2: - Metatarsal head first Wound Laterality: Left Cleanser: Normal Saline 1 x Per Day/30 Days Discharge Instructions: Cleanse the wound with Normal Saline prior to applying a clean dressing using gauze sponges, not tissue or cotton balls. Cleanser: Soap and Water 1 x Per Day/30 Days Discharge Instructions: May shower and wash wound with dial antibacterial soap and water prior to dressing change. Cleanser: Wound Cleanser 1 x Per Day/30 Days Discharge Instructions: Cleanse the wound with wound cleanser prior to applying a clean dressing using gauze sponges, not tissue or cotton balls. Topical: Silvadene Cream 1 x Per Day/30 Days Discharge Instructions: Apply thin layer to wound bed only Prim Dressing: Maxorb Extra Ag+ Alginate Dressing, 2x2 (in/in) 1 x Per Day/30 Days ary Discharge Instructions: Apply to wound bed as instructed Secondary Dressing: Woven Gauze Sponge, Non-Sterile 4x4 in 1 x Per Day/30 Days Discharge Instructions: Apply over primary dressing as directed. Secured With: Insurance underwriter, Sterile 2x75 (in/in) 1 x Per Day/30 Days Discharge Instructions: Secure with stretch gauze as directed. 09/03/2023: No significant change this week. Callus has accumulated again around each of the wounds and covers much of the left foot wound. I used a curette to debride callus, slough, and subcutaneous tissue from his wounds. The patient asked about using Silvadene; I told him  it was not typically used for this particular indication, but he was quite interested in trying it so we will send in a prescription for a small tube and change his contact layer to Silvadene. The primary issue seems to be his persistent tobacco use and inadequate offloading. He is supposed to be wearing Peg assist shoes but does not do so reliably. He will follow-up in 1 week. Electronic Signature(s) Eduardo [name], Eduardo [name] ([id number]) [id number] Page 9 of 11 Signed: 09/03/2023 11:12:17 AM By: Duanne Guess MD FACS Entered By: Duanne Guess on 09/03/2023 08:12:16 -------------------------------------------------------------------------------- HxROS Details Patient Name: Date of Service: Eduardo Ruder W. 09/03/2023 10:00 A M Medical Record Number: [id number] Patient Account Number: [id number] Date of Birth/Sex: Treating RN: 1970-12-10 (52 y.o. M) Primary Care Provider: Saralyn Pilar Other Clinician: Referring Provider: Treating Provider/Extender: Park Liter in Treatment: 6 Information Obtained From Patient Chart Constitutional Symptoms (General Health) Medical History: Past Medical History Notes: MRSA Hematologic/Lymphatic Medical History: Positive for: Lymphedema Respiratory Medical History: Positive for: Chronic Obstructive Pulmonary Disease (COPD); Sleep Apnea Past Medical History Notes: Pulmonary embolus Cardiovascular Medical History: Positive for: Deep Vein Thrombosis; Peripheral Arterial Disease Negative for: Hypertension Gastrointestinal Medical History: Past Medical History Notes: GERD (gastroesophageal reflux disease) Endocrine Medical History: Negative for: Type I Diabetes; Type II Diabetes Integumentary (Skin) Medical History: Past Medical History Notes: Pruritus Pilonidal cyst stasis dermatitis Musculoskeletal Medical History: Positive for: Osteoarthritis Past Medical History  Notes: Chronic back pain Degenerative disk disease Neurologic Medical History: Positive for: Neuropathy Eduardo [name], Eduardo [name] ([id number]) [id number] Page 10 of 11 Psychiatric Medical History: Past Medical History Notes: Anxiety Depression opiate dependence benzodiazepine dependence Immunizations Pneumococcal Vaccine: Received Pneumococcal Vaccination: No Implantable Devices None Hospitalization / Surgery History Type of Hospitalization/Surgery Mass excision (Right) T arthroplasty (Right) oe Nasal septoplasty w/ turbinoplasty Sinus endo with fusion Wrist fusion Thoracic outlet surgery Irrigation and debridement of back abscess Family and Social History Cancer: Yes - Siblings; Diabetes: Yes - Mother; Heart Disease: Yes - Father; Hereditary Spherocytosis:  No; Hypertension: Yes - Father; Kidney Disease: No; Lung Disease: No; Seizures: No; Stroke: No; Thyroid Problems: Yes - Mother; Tuberculosis: No; Current every day smoker - 1 ppd; Marital Status - Divorced; Alcohol Use: Rarely; Drug Use: Prior History; Caffeine Use: Daily; Financial Concerns: No; Food, Clothing or Shelter Needs: No; Support System Lacking: No; Transportation Concerns: No Electronic Signature(s) Signed: 09/03/2023 12:51:55 PM By: Duanne Guess MD FACS Entered By: Duanne Guess on 09/03/2023 07:56:24 -------------------------------------------------------------------------------- SuperBill Details Patient Name: Date of Service: Eduardo [name] 09/03/2023 Medical Record Number: [id number] Patient Account Number: [id number] Date of Birth/Sex: Treating RN: Dec 31, 1970 (52 y.o. M) Primary Care Provider: Saralyn Pilar Other Clinician: Referring Provider: Treating Provider/Extender: Park Liter in Treatment: 6 Diagnosis Coding ICD-10 Codes Code Description [phone] Non-pressure chronic ulcer of other part of right foot with fat layer  exposed L97.522 Non-pressure chronic ulcer of other part of left foot with fat layer exposed G90.09 Other idiopathic peripheral autonomic neuropathy Z72.0 Tobacco use E66.01 Morbid (severe) obesity due to excess calories Facility Procedures : CPT4 Code: [id number] Description: 11042 - DEB SUBQ TISSUE 20 SQ CM/< ICD-10 Diagnosis Description L97.512 Non-pressure chronic ulcer of other part of right foot with fat layer exposed L97.522 Non-pressure chronic ulcer of other part of left foot with fat layer exposed Modifier: Quantity: 1 Physician Procedures Eduardo [name], Eduardo [name] ([id number]): CPT4 Code Description [id number] - WC PHYS LEVEL 4 - EST PT ICD-10 Diagnosis Description L97.512 Non-pressure chronic ulcer of other part of right foot with fat L97.522 Non-pressure chronic ulcer of other part of left  foot with fat G90.09 Other idiopathic peripheral autonomic neuropathy Z72.0 Tobacco use [id number] Page 11 of 11: Quantity Modifier 1 layer exposed layer exposed Eduardo [name], Eduardo [name] ([id number]): [id number] 11042 - WC PHYS SUBQ TISS 20 SQ CM ICD-10 Diagnosis Description L97.512 Non-pressure chronic ulcer of other part of right foot with fat L97.522 Non-pressure chronic ulcer of other part of left foot with fat [id number] Page 11 of 11: 1 layer exposed layer exposed Electronic Signature(s) Signed: 09/03/2023 11:12:45 AM By: Duanne Guess MD FACS Entered By: Duanne Guess on 09/03/2023 08:12:45

## 2023-09-10 ENCOUNTER — Encounter (HOSPITAL_BASED_OUTPATIENT_CLINIC_OR_DEPARTMENT_OTHER): Payer: Medicaid Other | Admitting: General Surgery

## 2023-09-10 DIAGNOSIS — L97512 Non-pressure chronic ulcer of other part of right foot with fat layer exposed: Secondary | ICD-10-CM | POA: Diagnosis not present

## 2023-09-10 NOTE — Progress Notes (Signed)
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 [id number] Page 8 of 10 Electronic Signature(s) Signed: 09/10/2023 10:11:39 AM By: Duanne Guess MD FACS Entered By: Duanne Guess on 09/10/2023 10:11:38 -------------------------------------------------------------------------------- HxROS Details Patient Name: Date of Service: Eduardo Ruder W. 09/10/2023  9:30 A M Medical Record Number: [id number] Patient Account Number: [id number] Date of Birth/Sex: Treating RN: 11-23-1970 (52 y.o. M) Primary Care Provider: Saralyn [name] Other Clinician: Referring Provider: Treating  Provider/Extender: Eduardo [name] in Treatment: 7 Information Obtained From Patient Chart Constitutional Symptoms (General Health) Medical History: Past Medical History Notes: MRSA Hematologic/Lymphatic Medical History: Positive for: Lymphedema Respiratory Medical History: Positive for: Chronic Obstructive Pulmonary Disease (COPD); Sleep Apnea Past Medical History Notes: Pulmonary embolus Cardiovascular Medical History: Positive for: Deep Vein Thrombosis; Peripheral Arterial Disease Negative for: Hypertension Gastrointestinal Medical History: Past Medical History Notes: GERD (gastroesophageal reflux disease) Endocrine Medical History: Negative for: Type I Diabetes; Type II Diabetes Integumentary (Skin) Medical History: Past Medical History Notes: Pruritus Pilonidal cyst stasis dermatitis Musculoskeletal Medical History: Positive for: Osteoarthritis Past Medical History Notes: Chronic back pain Degenerative disk disease Neurologic Medical History: Positive for: Neuropathy Eduardo [name], Eduardo [name] ([id number]) [id number] Page 9 of 10 Psychiatric Medical History: Past Medical History Notes: Anxiety Depression opiate dependence benzodiazepine dependence Immunizations Pneumococcal Vaccine: Received Pneumococcal Vaccination: No Implantable Devices None Hospitalization / Surgery History Type of Hospitalization/Surgery Mass excision (Right) T arthroplasty (Right) oe Nasal septoplasty w/ turbinoplasty Sinus endo with fusion Wrist fusion Thoracic outlet surgery Irrigation and debridement of back abscess Family and Social History Cancer: Yes - Siblings; Diabetes: Yes - Mother; Heart Disease: Yes - Father; Hereditary Spherocytosis: No; Hypertension: Yes - Father; Kidney Disease: No; Lung Disease: No; Seizures: No; Stroke: No; Thyroid Problems: Yes - Mother; Tuberculosis: No; Current every day smoker - 1 ppd;  Marital Status - Divorced; Alcohol Use: Rarely; Drug Use: Prior History; Caffeine Use: Daily; Financial Concerns: No; Food, Clothing or Shelter Needs: No; Support System Lacking: No; Transportation Concerns: No Electronic Signature(s) Signed: 09/10/2023 12:30:01 PM By: Duanne Guess MD FACS Entered By: Duanne Guess on 09/10/2023 09:54:54 -------------------------------------------------------------------------------- SuperBill Details Patient Name: Date of Service: Eduardo [name] 09/10/2023 Medical Record Number: [id number] Patient Account Number: [id number] Date of Birth/Sex: Treating RN: 1971/07/16 (52 y.o. M) Primary Care Provider: Saralyn [name] Other Clinician: Referring Provider: Treating Provider/Extender: Eduardo [name] in Treatment: 7 Diagnosis Coding ICD-10 Codes Code Description 6145707397 Non-pressure chronic ulcer of other part of right foot with fat layer exposed L97.522 Non-pressure chronic ulcer of other part of left foot with fat layer exposed G90.09 Other idiopathic peripheral autonomic neuropathy Z72.0 Tobacco use E66.01 Morbid (severe) obesity due to excess calories Facility Procedures : Jeanie Cooks Code: [id number] D, Mckoy W ([id number] Description: 11042 - DEB SUBQ TISSUE 20 SQ CM/< ICD-10 Diagnosis Description L97.512 Non-pressure chronic ulcer of other part of right foot with fat layer exposed L97.522 Non-pressure chronic ulcer of other part of left foot with fat layer exposed 88325)  [id number] Modifier: [id number] Quantity: 1 7.pdf Page 10 of 10 Physician Procedures : CPT4 Code Description Modifier [id number] 99214 - WC PHYS LEVEL 4 - EST PT ICD-10 Diagnosis Description L97.512 Non-pressure chronic ulcer of other part of right foot with fat layer exposed L97.522 Non-pressure chronic ulcer of other part of left foot  with fat layer exposed G90.09 Other idiopathic peripheral autonomic neuropathy Z72.0 Tobacco  use Quantity: 1 : [id number] 11042 - WC PHYS SUBQ TISS 20 SQ CM ICD-10 Diagnosis Description L97.512 Non-pressure chronic ulcer of other part of right foot with fat layer exposed L97.522 Non-pressure chronic ulcer of other part of left foot with fat layer exposed Quantity: 1 Electronic Signature(s) Signed: 09/10/2023 10:12:10 AM By: Duanne Guess MD FACS Entered By: Duanne Guess on  09/10/2023 10:12:10 

## 2023-09-13 NOTE — Progress Notes (Signed)
Eduardo [name] ([id number]) [id number] Page 1 of 9 Visit Report for 09/10/2023 Arrival Information Details Patient Name: Date of Service: Eduardo [name] 09/10/2023 9:30 A M Medical Record Number: [id number] Patient Account Number: [id number] Date of Birth/Sex: Treating RN: 08-08-1971 (52 y.o. M) Primary Care Mosi Hannold: Saralyn Pilar Other Clinician: Referring Leiland Mihelich: Treating Antwonette Feliz/Extender: Park Liter in Treatment: 7 Visit Information History Since Last Visit Added or deleted any medications: No Patient Arrived: Ambulatory Any new allergies or adverse reactions: No Arrival Time: 09:25 Had a fall or experienced change in No Accompanied By: self activities of daily living that may affect Transfer Assistance: None risk of falls: Patient Identification Verified: Yes Signs or symptoms of abuse/neglect since last visito No Secondary Verification Process Completed: Yes Hospitalized since last visit: No Patient Requires Transmission-Based Precautions: No Implantable device outside of the clinic excluding No Patient Has Alerts: Yes cellular tissue based products placed in the center Patient Alerts: ABIs: R:1.11 L:1.18 9/24 since last visit: TBIs: R:1.13 L:1.02 9/24 Has Dressing in Place as Prescribed: Yes Pain Present Now: Yes Electronic Signature(s) Signed: 09/13/2023 8:39:52 AM By: Karl Ito Entered By: Karl Ito on 09/10/2023 06:25:37 -------------------------------------------------------------------------------- Encounter Discharge Information Details Patient Name: Date of Service: Eduardo Ruder W. 09/10/2023 9:30 A M Medical Record Number: [id number] Patient Account Number: [id number] Date of Birth/Sex: Treating RN: 07/14/1971 (52 y.o. [name] Primary Care Xoey Warmoth: Saralyn Pilar Other Clinician: Referring Derra Shartzer: Treating Nithin Demeo/Extender: Park Liter in Treatment: 7 Encounter Discharge Information Items Post Procedure Vitals Discharge Condition: Stable Temperature (F): 98.6 Ambulatory Status: Ambulatory Pulse (bpm): 67 Discharge Destination: Home Respiratory Rate (breaths/min): 18 Transportation: Private Auto Blood Pressure (mmHg): 170/83 Accompanied By: self Schedule Follow-up Appointment: Yes Clinical Summary of Care: Patient Declined Electronic Signature(s) Signed: 09/10/2023 1:02:55 PM By: Zenaida Deed RN, BSN Entered By: Zenaida Deed on 09/10/2023 09:23:54 Eduardo [name] ([id number]) [id number] Page 2 of 9 -------------------------------------------------------------------------------- Lower Extremity Assessment Details Patient Name: Date of Service: Eduardo [name] 09/10/2023 9:30 A M Medical Record Number: [id number] Patient Account Number: [id number] Date of Birth/Sex: Treating RN: May 23, 1971 (52 y.o. [name] Primary Care Donnae Michels: Saralyn Pilar Other Clinician: Referring Oreoluwa Gilmer: Treating Darel Ricketts/Extender: Harrie Jeans Weeks in Treatment: 7 Edema Assessment Assessed: Kyra Searles: No] [Right: No] Edema: [Left: Yes] [Right: Yes] Calf Left: Right: Point of Measurement: From Medial Instep 48.2 cm 48.8 cm Ankle Left: Right: Point of Measurement: From Medial Instep 29.1 cm 30.1 cm Vascular Assessment Extremity colors, hair growth, and conditions: Extremity Color: [Left:Hyperpigmented] [Right:Hyperpigmented] Hair Growth on Extremity: [Left:Yes] [Right:Yes] Temperature of Extremity: [Left:Warm] [Right:Warm] Capillary Refill: [Left:< 3 seconds] [Right:< 3 seconds] Dependent Rubor: [Left:No No] [Right:No No] Electronic Signature(s) Signed: 09/10/2023 12:12:43 PM By: Samuella Bruin Entered By: Samuella Bruin on 09/10/2023  06:32:10 -------------------------------------------------------------------------------- Multi Wound Chart Details Patient Name: Date of Service: Eduardo Ruder W. 09/10/2023 9:30 A M Medical Record Number: [id number] Patient Account Number: [id number] Date of Birth/Sex: Treating RN: Dec 13, 1970 (52 y.o. M) Primary Care Anfernee Peschke: Saralyn Pilar Other Clinician: Referring Lasaundra Riche: Treating Lerline Valdivia/Extender: Harrie Jeans Weeks in Treatment: 7 Vital Signs Height(in): 77 Pulse(bpm): 67 Weight(lbs): 330 Blood Pressure(mmHg): 170/83 Body Mass Index(BMI): 39.1 Temperature(F): 98.6 Respiratory Rate(breaths/min): 20 [1:Photos:] [[name]:[name] [id number] Page 3 of 9] Right Metatarsal head second Left Metatarsal head first [name] Wound Location: Thermal Burn Gradually Appeared [name] Wounding Event: Neuropathic Ulcer-Non Diabetic Neuropathic Ulcer-Non Diabetic [name] Primary Etiology:  Lymphedema, Chronic Obstructive Lymphedema, Chronic Obstructive [name] Comorbid History: Pulmonary Disease (COPD), Sleep Pulmonary Disease (COPD), Sleep Apnea, Deep Vein Thrombosis, Apnea, Deep Vein Thrombosis, Peripheral Arterial Disease, Peripheral Arterial Disease, Osteoarthritis, Neuropathy Osteoarthritis, Neuropathy 04/19/2023 05/20/2023 [name] Date Acquired: 7 7 [name] Weeks of Treatment: Open Open [name] Wound Status: No No [name] Wound Recurrence: 0.7x0.5x0.3 0.2x0.2x0.2 [name] Measurements L x W x D (cm) 0.275 0.031 [name] A (cm) : rea 0.082 0.006 [name] Volume (cm) : 87.30% 93.00% [name] % Reduction in A rea: 81.10% 93.20% [name] % Reduction in Volume: Full Thickness Without Exposed Full Thickness Without Exposed [name] Classification: Support Structures Support Structures Medium Medium [name] Exudate A mount: Serosanguineous Serosanguineous [name] Exudate Type: red, brown red, brown [name] Exudate Color: Distinct, outline attached Distinct, outline attached [name] Wound  Margin: Large (67-100%) Large (67-100%) [name] Granulation A mount: Red, Pale Red, Pink [name] Granulation Quality: Small (1-33%) None Present (0%) [name] Necrotic A mount: Fat Layer (Subcutaneous Tissue): Yes Fat Layer (Subcutaneous Tissue): Yes [name] Exposed Structures: Fascia: No Fascia: No Tendon: No Tendon: No Muscle: No Muscle: No Joint: No Joint: No Bone: No Bone: No Small (1-33%) None [name] Epithelialization: [name] Debridement - Excisional [name] Debridement: Pre-procedure Verification/Time Out [name] 09:35 [name] Taken: [name] Lidocaine 4% Topical Solution [name] Pain Control: [name] Callus, Subcutaneous, Slough [name] Tissue Debrided: [name] Skin/Subcutaneous Tissue [name] Level: [name] 0.15 [name] Debridement A (sq cm): rea [name] Curette [name] Instrument: [name] Minimum [name] Bleeding: [name] Pressure [name] Hemostasis A chieved: [name] 0 [name] Procedural Pain: [name] 0 [name] Post Procedural Pain: [name] Procedure was tolerated well [name] Debridement Treatment Response: [name] 0.4x0.4x0.2 [name] Post Debridement Measurements L x W x D (cm) [name] 0.025 [name] Post Debridement Volume: (cm) Callus: Yes Callus: Yes [name] Periwound Skin Texture: No Abnormalities Noted No Abnormalities Noted [name] Periwound Skin Moisture: No Abnormalities Noted No Abnormalities Noted [name] Periwound Skin Color: No Abnormality No Abnormality [name] Temperature: [name] Debridement [name] Procedures Performed: Treatment Notes Electronic Signature(s) Signed: 09/10/2023 9:53:38 AM By: Duanne Guess MD FACS Entered By: Duanne Guess on 09/10/2023 06:53:38 -------------------------------------------------------------------------------- Multi-Disciplinary Care Plan Details Patient Name: Date of Service: Eduardo Ruder W. 09/10/2023 9:30 A M Medical Record Number: [id number] Patient Account Number: [id number] Date of Birth/Sex: Treating RN: April 25, 1971 (52 y.o. [name] Primary Care Sanaa Zilberman: Saralyn Pilar Other Clinician: Referring  Aaryan Essman: Treating Mervyn Pflaum/Extender: Harrie Jeans Arthur ([id number]) [id number] Page 4 of 9 Weeks in Treatment: 7 Multidisciplinary Care Plan reviewed with physician Active Inactive Peripheral Neuropathy Nursing Diagnoses: Knowledge deficit related to disease process and management of peripheral neurovascular dysfunction Potential alteration in peripheral tissue perfusion (select prior to confirmation of diagnosis) Goals: Patient/caregiver will verbalize understanding of disease process and disease management Date Initiated: 07/22/2023 Target Resolution Date: 09/24/2023 Goal Status: Active Interventions: Assess signs and symptoms of neuropathy upon admission and as needed Provide education on Management of Neuropathy and Related Ulcers Notes: Wound/Skin Impairment Nursing Diagnoses: Impaired tissue integrity Knowledge deficit related to ulceration/compromised skin integrity Goals: Patient will demonstrate a reduced rate of smoking or cessation of smoking Date Initiated: 07/22/2023 Target Resolution Date: 09/24/2023 Goal Status: Active Patient/caregiver will verbalize understanding of skin care regimen Date Initiated: 07/22/2023 Target Resolution Date: 09/24/2023 Goal Status: Active Interventions: Assess patient/caregiver ability to obtain necessary supplies Assess patient/caregiver ability to perform ulcer/skin care regimen upon admission and as needed Assess ulceration(s) every visit Provide education on smoking Treatment Activities: Referred to DME Earlie Arciga for dressing supplies : 07/22/2023 Skin care regimen initiated : 07/22/2023  Topical wound management initiated : 07/22/2023 Notes: Electronic Signature(s) Signed: 09/10/2023 1:02:55 PM By: Zenaida Deed RN, BSN Entered By: Zenaida Deed on 09/10/2023 06:39:17 -------------------------------------------------------------------------------- Pain Assessment  Details Patient Name: Date of Service: Eduardo Ruder W. 09/10/2023 9:30 A M Medical Record Number: [id number] Patient Account Number: [id number] Date of Birth/Sex: Treating RN: Mar 06, 1971 (52 y.o. M) Primary Care Jeanise Durfey: Saralyn Pilar Other Clinician: Referring Avier Jech: Treating Augustina Braddock/Extender: Harrie Jeans Weeks in Treatment: 7 Active Problems Location of Pain Severity and Description of Pain [facility], [facility] ([id number]) [id number].pdf Page 5 of 9 Patient Has Paino Yes Site Locations Rate the pain. Current Pain Level: 8 Pain Management and Medication Current Pain Management: Electronic Signature(s) Signed: 09/13/2023 8:39:52 AM By: Karl Ito Entered By: Karl Ito on 09/10/2023 06:26:19 -------------------------------------------------------------------------------- Patient/Caregiver Education Details Patient Name: Date of Service: Eduardo [name] 11/22/2024andnbsp9:30 A M Medical Record Number: [id number] Patient Account Number: [id number] Date of Birth/Gender: Treating RN: July 08, 1971 (52 y.o. [name] Primary Care Physician: Saralyn Pilar Other Clinician: Referring Physician: Treating Physician/Extender: Park Liter in Treatment: 7 Education Assessment Education Provided To: Patient Education Topics Provided Offloading: Methods: Explain/Verbal Responses: Reinforcements needed, State content correctly Wound/Skin Impairment: Methods: Explain/Verbal Responses: Reinforcements needed, State content correctly Electronic Signature(s) Signed: 09/10/2023 1:02:55 PM By: Zenaida Deed RN, BSN Entered By: Zenaida Deed on 09/10/2023 06:39:56 Eduardo [name] ([id number]) [id number] Page 6 of 9 -------------------------------------------------------------------------------- Wound Assessment Details Patient Name: Date of  Service: Eduardo [name] 09/10/2023 9:30 A M Medical Record Number: [id number] Patient Account Number: [id number] Date of Birth/Sex: Treating RN: Feb 16, 1971 (52 y.o. M) Primary Care Yamili Lichtenwalner: Saralyn Pilar Other Clinician: Referring Guy Toney: Treating Saphia Vanderford/Extender: Harrie Jeans Weeks in Treatment: 7 Wound Status Wound Number: 1 Primary Neuropathic Ulcer-Non Diabetic Etiology: Wound Location: Right Metatarsal head second Wound Open Wounding Event: Thermal Burn Status: Date Acquired: 04/19/2023 Comorbid Lymphedema, Chronic Obstructive Pulmonary Disease (COPD), Weeks Of Treatment: 7 History: Sleep Apnea, Deep Vein Thrombosis, Peripheral Arterial Disease, Clustered Wound: No Osteoarthritis, Neuropathy Photos Wound Measurements Length: (cm) 0.7 Width: (cm) 0.5 Depth: (cm) 0.3 Area: (cm) 0.275 Volume: (cm) 0.082 % Reduction in Area: 87.3% % Reduction in Volume: 81.1% Epithelialization: Small (1-33%) Wound Description Classification: Full Thickness Without Exposed Support Structures Wound Margin: Distinct, outline attached Exudate Amount: Medium Exudate Type: Serosanguineous Exudate Color: red, brown Foul Odor After Cleansing: No Slough/Fibrino Yes Wound Bed Granulation Amount: Large (67-100%) Exposed Structure Granulation Quality: Red, Pale Fascia Exposed: No Necrotic Amount: Small (1-33%) Fat Layer (Subcutaneous Tissue) Exposed: Yes Necrotic Quality: Adherent Slough Tendon Exposed: No Muscle Exposed: No Joint Exposed: No Bone Exposed: No Periwound Skin Texture Texture Color No Abnormalities Noted: No No Abnormalities Noted: Yes Callus: Yes Temperature / Pain Temperature: No Abnormality Moisture No Abnormalities Noted: Yes Treatment Notes Wound #1 (Metatarsal head second) Wound Laterality: Right Cleanser Normal Saline Discharge Instruction: Cleanse the wound with Normal Saline prior to applying a clean dressing using gauze  sponges, not tissue or cotton balls. Soap and Water Discharge Instruction: May shower and wash wound with dial antibacterial soap and water prior to dressing change. Wound Cleanser Discharge Instruction: Cleanse the wound with wound cleanser prior to applying a clean dressing using gauze sponges, not tissue or cotton balls. Eduardo [name] ([id number]) [id number] Page 7 of 9 Peri-Wound Care Topical Silvadene Cream Discharge Instruction: Apply thin layer to wound bed only Primary Dressing Maxorb Extra Ag+ Alginate Dressing, 2x2 (in/in) Discharge Instruction:  Apply to wound bed as instructed Secondary Dressing Woven Gauze Sponge, Non-Sterile 4x4 in Discharge Instruction: Apply over primary dressing as directed. Secured With Conforming Stretch Gauze Bandage, Sterile 2x75 (in/in) Discharge Instruction: Secure with stretch gauze as directed. Compression Wrap Compression Stockings Add-Ons Electronic Signature(s) Signed: 09/13/2023 8:39:52 AM By: Karl Ito Entered By: Karl Ito on 09/10/2023 06:32:38 -------------------------------------------------------------------------------- Wound Assessment Details Patient Name: Date of Service: Eduardo Ruder W. 09/10/2023 9:30 A M Medical Record Number: [id number] Patient Account Number: [id number] Date of Birth/Sex: Treating RN: 08-07-1971 (52 y.o. M) Primary Care Eimi Viney: Saralyn Pilar Other Clinician: Referring Beaulah Romanek: Treating Andruw Battie/Extender: Harrie Jeans Weeks in Treatment: 7 Wound Status Wound Number: 2 Primary Neuropathic Ulcer-Non Diabetic Etiology: Wound Location: Left Metatarsal head first Wound Open Wounding Event: Gradually Appeared Status: Date Acquired: 05/20/2023 Comorbid Lymphedema, Chronic Obstructive Pulmonary Disease (COPD), Weeks Of Treatment: 7 History: Sleep Apnea, Deep Vein Thrombosis, Peripheral Arterial Disease, Clustered Wound: No  Osteoarthritis, Neuropathy Photos Wound Measurements Length: (cm) 0.2 Width: (cm) 0.2 Depth: (cm) 0.2 Area: (cm) 0.031 Volume: (cm) 0.006 OLIN, ANDER ([id number]) Wound Description Classification: Full Thickness Without Exposed Support Structures Wound Margin: Distinct, outline attached Exudate Amount: Medium Exudate Type: Serosanguineous Exudate Color: red, brown Foul Odor After Cleansing: No Slough/Fibrino Yes % Reduction in Area: 93% % Reduction in Volume: 93.2% Epithelialization: None [id number] Page 8 of 9 Wound Bed Granulation Amount: Large (67-100%) Exposed Structure Granulation Quality: Red, Pink Fascia Exposed: No Necrotic Amount: None Present (0%) Fat Layer (Subcutaneous Tissue) Exposed: Yes Tendon Exposed: No Muscle Exposed: No Joint Exposed: No Bone Exposed: No Periwound Skin Texture Texture Color No Abnormalities Noted: No No Abnormalities Noted: Yes Callus: Yes Temperature / Pain Temperature: No Abnormality Moisture No Abnormalities Noted: Yes Treatment Notes Wound #2 (Metatarsal head first) Wound Laterality: Left Cleanser Normal Saline Discharge Instruction: Cleanse the wound with Normal Saline prior to applying a clean dressing using gauze sponges, not tissue or cotton balls. Soap and Water Discharge Instruction: May shower and wash wound with dial antibacterial soap and water prior to dressing change. Wound Cleanser Discharge Instruction: Cleanse the wound with wound cleanser prior to applying a clean dressing using gauze sponges, not tissue or cotton balls. Peri-Wound Care Topical Silvadene Cream Discharge Instruction: Apply thin layer to wound bed only Primary Dressing Maxorb Extra Ag+ Alginate Dressing, 2x2 (in/in) Discharge Instruction: Apply to wound bed as instructed Secondary Dressing Woven Gauze Sponge, Non-Sterile 4x4 in Discharge Instruction: Apply over primary dressing as directed. Secured  With Conforming Stretch Gauze Bandage, Sterile 2x75 (in/in) Discharge Instruction: Secure with stretch gauze as directed. Compression Wrap Compression Stockings Add-Ons Electronic Signature(s) Signed: 09/13/2023 8:39:52 AM By: Karl Ito Entered By: Karl Ito on 09/10/2023 06:33:00 Vitals Details -------------------------------------------------------------------------------- Eduardo [name] ([id number]) [id number] Page 9 of 9 Patient Name: Date of Service: Eduardo [name] 09/10/2023 9:30 A M Medical Record Number: [id number] Patient Account Number: [id number] Date of Birth/Sex: Treating RN: 1970/12/15 (52 y.o. M) Primary Care Rohn Fritsch: Saralyn Pilar Other Clinician: Referring Kareemah Grounds: Treating Maeryn Mcgath/Extender: Harrie Jeans Weeks in Treatment: 7 Vital Signs Time Taken: 09:25 Temperature (F): 98.6 Height (in): 77 Pulse (bpm): 67 Weight (lbs): 330 Respiratory Rate (breaths/min): 20 Body Mass Index (BMI): 39.1 Blood Pressure (mmHg): 170/83 Reference Range: 80 - 120 mg / dl Electronic Signature(s) Signed: 09/13/2023 8:39:52 AM By: Karl Ito Entered By: Karl Ito on 09/10/2023 06:26:11

## 2023-09-27 ENCOUNTER — Ambulatory Visit (HOSPITAL_BASED_OUTPATIENT_CLINIC_OR_DEPARTMENT_OTHER): Payer: Medicaid Other | Admitting: Internal Medicine

## 2023-09-28 ENCOUNTER — Encounter (HOSPITAL_BASED_OUTPATIENT_CLINIC_OR_DEPARTMENT_OTHER): Payer: Medicaid Other | Attending: Internal Medicine | Admitting: Internal Medicine

## 2023-09-28 ENCOUNTER — Encounter: Payer: Self-pay | Admitting: Vascular Surgery

## 2023-09-28 ENCOUNTER — Ambulatory Visit (INDEPENDENT_AMBULATORY_CARE_PROVIDER_SITE_OTHER)
Admission: RE | Admit: 2023-09-28 | Discharge: 2023-09-28 | Disposition: A | Discharge status: Home / Self Care | Payer: Medicaid Other | Source: Ambulatory Visit | Attending: Vascular Surgery

## 2023-09-28 ENCOUNTER — Ambulatory Visit (HOSPITAL_COMMUNITY)
Admission: RE | Admit: 2023-09-28 | Discharge: 2023-09-28 | Disposition: A | Discharge status: Home / Self Care | Payer: Medicaid Other | Source: Ambulatory Visit | Attending: Vascular Surgery | Admitting: Vascular Surgery

## 2023-09-28 ENCOUNTER — Ambulatory Visit (INDEPENDENT_AMBULATORY_CARE_PROVIDER_SITE_OTHER): Payer: Medicaid Other | Admitting: Vascular Surgery

## 2023-09-28 VITALS — BP 147/89 | HR 78 | Temp 97.9°F | Ht 77.0 in | Wt 333.9 lb

## 2023-09-28 DIAGNOSIS — Z6839 Body mass index (BMI) 39.0-39.9, adult: Secondary | ICD-10-CM | POA: Insufficient documentation

## 2023-09-28 DIAGNOSIS — G9009 Other idiopathic peripheral autonomic neuropathy: Secondary | ICD-10-CM | POA: Insufficient documentation

## 2023-09-28 DIAGNOSIS — J449 Chronic obstructive pulmonary disease, unspecified: Secondary | ICD-10-CM | POA: Diagnosis not present

## 2023-09-28 DIAGNOSIS — L97512 Non-pressure chronic ulcer of other part of right foot with fat layer exposed: Secondary | ICD-10-CM | POA: Insufficient documentation

## 2023-09-28 DIAGNOSIS — I10 Essential (primary) hypertension: Secondary | ICD-10-CM | POA: Insufficient documentation

## 2023-09-28 DIAGNOSIS — L97522 Non-pressure chronic ulcer of other part of left foot with fat layer exposed: Secondary | ICD-10-CM

## 2023-09-28 DIAGNOSIS — F17210 Nicotine dependence, cigarettes, uncomplicated: Secondary | ICD-10-CM | POA: Diagnosis not present

## 2023-09-28 DIAGNOSIS — I739 Peripheral vascular disease, unspecified: Secondary | ICD-10-CM | POA: Diagnosis not present

## 2023-09-28 LAB — VAS US ABI WITH/WO TBI
Left ABI: 1.18
Right ABI: 1.1

## 2023-09-28 NOTE — Progress Notes (Signed)
Patient name: KETCH RIVERA MRN: [id number] DOB: 28-May-1971 Sex: male  REASON FOR CONSULT: F/U, bilateral lower extremity ulcers with fat layer exposed  HPI: GOVINDA RABE is a 52 y.o. male, with history of upper extremity DVT/TOS, hypertension, tobacco abuse that presents for follow-up of bilateral lower extremity ulcers with fat layer exposed.  Patient is followed by Dr. Verna Czech with podiatry.  He did have ABIs on 06/24/2023 that were 1.11 on the right triphasic and 1.18 on the left triphasic.  Patient states the ulcer on his left foot has been there for several months.  The ulcers on his right foot have been present for years.  He is now going to the wound clinic.  States he smokes 1.5 packs/day.  I brought him back for lower extremity arterial duplex studies as he had palpable pulses.  Past Medical History:  Diagnosis Date   Anxiety    Chronic back pain    Degenerative disk disease    Depression    DVT of upper extremity (deep vein thrombosis) (HCC)    GERD (gastroesophageal reflux disease)    Hypertension    MRSA (methicillin resistant staph aureus) culture positive    PAD (peripheral artery disease) (HCC)    Peripheral arterial disease (HCC)    Peripheral neuropathy    Pilonidal cyst    Pruritus 12/23/2021   Pulmonary embolus (HCC)    no blood thinner 2002   Sleep apnea    can't use cpap due to deviated nasal septumg    Past Surgical History:  Procedure Laterality Date   Irrigation and debridement of back abscess     MASS EXCISION Right 06/14/2019   Procedure: EXCISION BENIGN LESION RIGHT FOOT;  Surgeon: Park Liter, DPM;  Location: [facility];  Service: Podiatry;  Laterality: Right;   NASAL SEPTOPLASTY W/ TURBINOPLASTY [name] 02/03/2018   Procedure: NASAL SEPTOPLASTY WITH TURBINATE REDUCTION;  Surgeon: Suzanna Obey, MD;  Location: [facility] OR;  Service: ENT;  Laterality: [name];   PILONIDAL CYST DRAINAGE [name] 04/10/2017   Procedure: IRRIGATION AND DEBRIDEMENT BACK ABSCESS;   Surgeon: Karie Soda, MD;  Location: [facility];  Service: General;  Laterality: [name];   SINUS ENDO WITH FUSION [name] 02/03/2018   Procedure: ENDOSCOPIC SINUS SURGERY WITH FUSION;  Surgeon: Suzanna Obey, MD;  Location: [facility] OR;  Service: ENT;  Laterality: [name];   THORACIC OUTLET SURGERY  2000   TOE ARTHROPLASTY Right 06/14/2019   Procedure: HALLUX ARTHROPLASTY RIGHT FOOT WITH TISSUE TRANSER EXCISION OF SESAMOID BONE;  Surgeon: Park Liter, DPM;  Location: [facility];  Service: Podiatry;  Laterality: Right;   WRIST FUSION  2002    Family History  Problem Relation Age of Onset   Hypertension Mother    Diabetes Mother    Hypertension Father    Heart disease Father    Pancreatic cancer Brother    Cancer Maternal Grandmother        breast, ovarian & colon   Stroke Paternal Grandfather    Neuropathy Neg Hx    Colon cancer Neg Hx    Esophageal cancer Neg Hx    Liver disease Neg Hx     SOCIAL HISTORY: Social History   Socioeconomic History   Marital status: Divorced    Spouse name: Not on file   Number of children: 2   Years of education: 12th    Highest education level: Not on file  Occupational History   Occupation: [name]  Tobacco Use   Smoking status: Every Day  Current packs/day: 1.00    Average packs/day: 1 pack/day for 33.0 years (33.0 ttl pk-yrs)    Types: Cigarettes    Passive exposure: Never   Smokeless tobacco: Never   Tobacco comments:    1pack per day 09/22/2022  Vaping Use   Vaping status: Former  Substance and Sexual Activity   Alcohol use: No    Alcohol/week: 0.0 standard drinks of alcohol    Comment: Rarely   Drug use: No   Sexual activity: Yes    Birth control/protection: None  Other Topics Concern   Not on file  Social History Narrative   Lives at home w/ his step-mother   Right-handed   Drinks about 2-3 Mt Dews a day   Social Determinants of Corporate investment banker Strain: Not on file  Food Insecurity: Not on file  Transportation Needs: Not on  file  Physical Activity: Not on file  Stress: Not on file  Social Connections: Not on file  Intimate Partner Violence: Not on file    Allergies  Allergen Reactions   Gabapentin Other (See Comments)   Pollen Extract Cough   Dust Mite Extract Other (See Comments)   Other Other (See Comments)    Ragweed    Pregabalin Swelling    Current Outpatient Medications  Medication Sig Dispense Refill   Acetaminophen (TYLENOL ARTHRITIS PAIN PO) Take 600 mg by mouth 3 (three) times daily.     desvenlafaxine (PRISTIQ) 50 MG 24 hr tablet Take 1 tablet (50 mg total) by mouth daily. 30 tablet 3   furosemide (LASIX) 20 MG tablet Take 20 mg by mouth daily.     oxyCODONE-acetaminophen (PERCOCET) 10-325 MG tablet Take 1 tablet by mouth every 6 (six) hours as needed for pain.     QUEtiapine (SEROQUEL) 25 MG tablet TAKE 1 TABLET BY MOUTH EVERY NIGHT AT BEDTIME 30 tablet 2   mupirocin ointment (BACTROBAN) 2 % Apply 1 Application topically daily. (Patient not taking: Reported on 07/29/2023) 22 g 3   No current facility-administered medications for this visit.    REVIEW OF SYSTEMS:  [X]  denotes positive finding, [ ]  denotes negative finding Cardiac  Comments:  Chest pain or chest pressure:    Shortness of breath upon exertion:    Short of breath when lying flat:    Irregular heart rhythm:        Vascular    Pain in calf, thigh, or hip brought on by ambulation:    Pain in feet at night that wakes you up from your sleep:     Blood clot in your veins:    Leg swelling:         Pulmonary    Oxygen at home:    Productive cough:     Wheezing:         Neurologic    Sudden weakness in arms or legs:     Sudden numbness in arms or legs:     Sudden onset of difficulty speaking or slurred speech:    Temporary loss of vision in one eye:     Problems with dizziness:         Gastrointestinal    Blood in stool:     Vomited blood:         Genitourinary    Burning when urinating:     Blood in urine:         Psychiatric    Major depression:         Hematologic  Bleeding problems:    Problems with blood clotting too easily:        Skin    Rashes or ulcers:        Constitutional    Fever or chills:      PHYSICAL EXAM: Vitals:   09/28/23 1151  BP: (!) 147/89  Pulse: 78  Temp: 97.9 F (36.6 C)  TempSrc: Temporal  SpO2: 93%  Weight: (!) 333 lb 14.4 oz (151.5 kg)  Height: 6\' 5"  (1.956 m)    GENERAL: The patient is a well-nourished male, in no acute distress. The vital signs are documented above. CARDIAC: There is a regular rate and rhythm.  VASCULAR:  Bilateral femoral pulses palpable Bilateral PT pulses palpable Ulcer on the plantar surface of each foot as pictured  PULMONARY: No respiratory distress ABDOMEN: Soft and non-tender. MUSCULOSKELETAL: There are no major deformities or cyanosis. NEUROLOGIC: No focal weakness or paresthesias are detected. PSYCHIATRIC: The patient has a normal affect.    DATA:   Lower extremity arterial duplex today shows widely patent arterial system in both lower extremities without evidence of significant stenosis or disease.  ABIs on 06/24/2023 that were 1.11 on the right triphasic and 1.18 on the left triphasic.  Assessment/Plan:  52 y.o. male, with history of upper extremity DVT/TOS, hypertension, tobacco abuse that presents for follow-up of bilateral lower extremity ulcers with fat layer exposed.  Patient is followed by Dr. Verna Czech with podiatry.  I discussed his lower extremity arterial duplex studies today show widely patent arterial system in both lower extremities without any significant stenosis or disease.  Again he has easily palpable posterior tibial pulses at the ankle.  I discussed that he should have adequate inflow for wound healing.  He is going to the wound clinic now.  I will arrange follow-up with me in 3 months to make sure these continue to show progress.   Cephus Shelling, MD [facility]  [place] Office: [phone]

## 2023-09-29 NOTE — Progress Notes (Signed)
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 with a total area of 0.66 sq cm performed by Maxwell Caul., MD. With the following instrument(s): Curette to remove Viable and  Non-Viable tissue/material. Material removed includes Callus, Subcutaneous Tissue, Slough, and Skin: Epidermis after achieving pain control using Lidocaine 4% T opical Solution. No specimens were taken. A time out was conducted at 14:00, prior to the start of the procedure. A Minimum amount of bleeding was controlled with Pressure. The procedure was tolerated well with a pain level of 0 throughout and a pain level of 0 following the procedure. Post Debridement Measurements: 0.7cm length x 1.2cm  width x 0.2cm depth; 0.132cm^3 volume. Character of Wound/Ulcer Post Debridement is improved. Post procedure Diagnosis Wound #2: Same as Pre-Procedure Plan Follow-up Appointments: Return Appointment in 2 weeks. - Dr. Lady Gary Anesthetic: (In clinic) Topical Lidocaine 4% applied to wound bed Bathing/ Shower/ Hygiene: May shower and wash wound with soap and water. - with dressing changes Off-Loading: Open toe surgical shoe to: - to both feet with peg assist inserts Additional Orders / Instructions: Stop/Decrease Smoking Non Wound Condition: Apply the following to affected area as directed: - 40% urea cream to callouses daily (not in wound beds) WOUND #1: - Metatarsal head second Wound Laterality: Right Cleanser: Normal Saline 1 x Per Day/30 Days Eduardo [name], Eduardo [name] ([id number]) [id number] Page 8 of 9 Discharge Instructions: Cleanse the wound with Normal Saline prior to applying a clean dressing using gauze sponges, not tissue or cotton balls. Cleanser: Soap and Water 1 x Per Day/30 Days Discharge Instructions: May shower and wash wound with dial antibacterial soap and water prior to dressing change. Cleanser: Wound Cleanser 1 x Per Day/30 Days Discharge Instructions: Cleanse the wound with wound cleanser prior to applying a clean dressing using gauze sponges, not tissue or cotton balls. Topical: Silvadene Cream 1 x Per Day/30 Days Discharge Instructions: Apply thin layer to wound bed only Prim Dressing: Maxorb Extra Ag+ Alginate Dressing, 2x2 (in/in) 1 x Per Day/30 Days ary Discharge Instructions: Apply to wound bed as instructed Secondary Dressing: Woven Gauze Sponge, Non-Sterile 4x4 in 1 x Per Day/30 Days Discharge Instructions: Apply over primary dressing as directed. Secured With: Insurance underwriter, Sterile 2x75 (in/in) 1 x Per Day/30 Days Discharge Instructions: Secure with stretch gauze as directed. WOUND #2: - Metatarsal head first Wound  Laterality: Left Cleanser: Normal Saline 1 x Per Day/30 Days Discharge Instructions: Cleanse the wound with Normal Saline prior to applying a clean dressing using gauze sponges, not tissue or cotton balls. Cleanser: Soap and Water 1 x Per Day/30 Days Discharge Instructions: May shower and wash wound with dial antibacterial soap and water prior to dressing change. Cleanser: Wound Cleanser 1 x Per Day/30 Days Discharge Instructions: Cleanse the wound with wound cleanser prior to applying a clean dressing using gauze sponges, not tissue or cotton balls. Topical: Silvadene Cream 1 x Per Day/30 Days Discharge Instructions: Apply thin layer to wound bed only Prim Dressing: Maxorb Extra Ag+ Alginate Dressing, 2x2 (in/in) 1 x Per Day/30 Days ary Discharge Instructions: Apply to wound bed as instructed Secondary Dressing: Woven Gauze Sponge, Non-Sterile 4x4 in 1 x Per Day/30 Days Discharge Instructions: Apply over primary dressing as directed. Secured With: Insurance underwriter, Sterile 2x75 (in/in) 1 x Per Day/30 Days Discharge Instructions: Secure with stretch gauze as directed. WOUND #4: - Foot Wound Laterality: Plantar, Right, Distal Cleanser: Normal Saline 1 x Per Day/30 Days Discharge Instructions: Cleanse the wound with Normal Saline prior to applying a clean dressing using gauze sponges, not tissue or cotton balls. Cleanser: Soap and Water 1 x Per Day/30 Days Discharge Instructions: May shower and wash wound with  dial antibacterial soap and water prior to dressing change. Cleanser: Wound Cleanser 1 x Per Day/30 Days Discharge Instructions: Cleanse the wound with wound cleanser prior to applying a clean dressing using gauze sponges, not tissue or cotton balls. Topical: Silvadene Cream 1 x Per Day/30 Days Discharge Instructions: Apply thin layer to wound bed only Prim Dressing: Maxorb Extra Ag+ Alginate Dressing, 2x2 (in/in) 1 x Per Day/30 Days ary Discharge Instructions: Apply to  wound bed as instructed Secondary Dressing: Woven Gauze Sponge, Non-Sterile 4x4 in 1 x Per Day/30 Days Discharge Instructions: Apply over primary dressing as directed. Secured With: Insurance underwriter, Sterile 2x75 (in/in) 1 x Per Day/30 Days Discharge Instructions: Secure with stretch gauze as directed. 1. We are continuing with the silver alginate based dressings 2. He has been using Silvadene cream under silver alginate based dressings 3. Not sure about his compliance with the surgical shoes Pegasys. 4. I cautioned that the right foot wound is deeper and that is concerning. The new wound on the left foot is superficial but larger than the original wound over the first met head Electronic Signature(s) Signed: 09/29/2023 10:03:28 AM By: Baltazar Najjar MD Entered By: Baltazar Najjar on 09/28/2023 11:26:29 -------------------------------------------------------------------------------- SuperBill Details Patient Name: Date of Service: Eduardo [name] 09/28/2023 Medical Record Number: [id number] Patient Account Number: [id number] Date of Birth/Sex: Treating RN: [birth year] (52 y.o. 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 autonomic neuropathy Z72.0 Tobacco use Eduardo, [name] ([id number]) [id number] Page 9 of 9 E66.01 Morbid (severe) obesity due to excess calories Facility Procedures : CPT4 Code: [id number] Description: 11042 - DEB SUBQ TISSUE 20 SQ CM/< ICD-10 Diagnosis Description L97.512 Non-pressure chronic ulcer of other part of right foot with fat layer  exposed Modifier: Quantity: 1 Physician Procedures : CPT4 Code Description Modifier [id number] 11042 - WC PHYS SUBQ TISS 20 SQ CM ICD-10 Diagnosis Description L97.512 Non-pressure chronic ulcer of other part of right foot with fat layer exposed Quantity: 1 Electronic Signature(s) Signed: 09/29/2023 10:03:28 AM By: Baltazar Najjar MD Entered By: Baltazar Najjar on 09/28/2023 11:26:42

## 2023-09-29 NOTE — Progress Notes (Signed)
AVRAJ, SCHLABACH ([id number]) [id number] Page 1 of 11 Visit Report for 09/28/2023 Arrival Information Details Patient Name: Date of Service: Eduardo [name] 09/28/2023 1:15 PM Medical Record Number: [id number] Patient Account Number: [id number] Date of Birth/Sex: Treating RN: 04-Sep-1971 (52 y.o. [name] Primary Care Alizzon Dioguardi: Saralyn Pilar Other Clinician: Referring Kahli Fitzgerald: Treating Donnisha Besecker/Extender: Aldona Lento in Treatment: 9 Visit Information History Since Last Visit Added or deleted any medications: No Patient Arrived: Ambulatory Any new allergies or adverse reactions: No Arrival Time: 13:30 Had a fall or experienced change in No Accompanied By: self activities of daily living that may affect Transfer Assistance: None risk of falls: Patient Identification Verified: Yes Signs or symptoms of abuse/neglect since last visito No Secondary Verification Process Completed: Yes Hospitalized since last visit: No Patient Requires Transmission-Based Precautions: No Implantable device outside of the clinic excluding No Patient Has Alerts: Yes cellular tissue based products placed in the center Patient Alerts: ABIs: R:1.11 L:1.18 9/24 since last visit: TBIs: R:1.13 L:1.02 9/24 Has Dressing in Place as Prescribed: Yes Pain Present Now: Yes Electronic Signature(s) Signed: 09/28/2023 4:57:48 PM By: Zenaida Deed RN, BSN Entered By: Zenaida Deed on 09/28/2023 10:31:12 -------------------------------------------------------------------------------- Encounter Discharge Information Details Patient Name: Date of Service: Eduardo Ruder W. 09/28/2023 1:15 PM Medical Record Number: [id number] Patient Account Number: [id number] Date of Birth/Sex: Treating RN: 1971/04/13 (52 y.o. [name] Primary Care Sylvia Kondracki: Saralyn Pilar Other Clinician: Referring Deen Deguia: Treating Journee Bobrowski/Extender:  Aldona Lento in Treatment: 9 Encounter Discharge Information Items Post Procedure Vitals Discharge Condition: Stable Temperature (F): 98.9 Ambulatory Status: Ambulatory Pulse (bpm): 77 Discharge Destination: Home Respiratory Rate (breaths/min): 18 Transportation: Private Auto Blood Pressure (mmHg): 170/102 Accompanied By: self Schedule Follow-up Appointment: Yes Clinical Summary of Care: Patient Declined Electronic Signature(s) Signed: 09/28/2023 4:57:48 PM By: Zenaida Deed RN, BSN Entered By: Zenaida Deed on 09/28/2023 11:22:18 Eduardo [name] ([id number]) [id number] Page 2 of 11 -------------------------------------------------------------------------------- Lower Extremity Assessment Details Patient Name: Date of Service: Eduardo [name] 09/28/2023 1:15 PM Medical Record Number: [id number] Patient Account Number: [id number] Date of Birth/Sex: Treating RN: September 01, 1971 (52 y.o. [name] Primary Care Kashlyn Salinas: Saralyn Pilar Other Clinician: Referring Rodolph Hagemann: Treating Jori Frerichs/Extender: Aldona Lento in Treatment: 9 Edema Assessment Assessed: Eduardo [name]: No] Franne Forts: No] Edema: [Left: Yes] [Right: Yes] Calf Left: Right: Point of Measurement: From Medial Instep 46 cm 49 cm Ankle Left: Right: Point of Measurement: From Medial Instep 26 cm 27 cm Vascular Assessment Pulses: Dorsalis Pedis Palpable: [Left:Yes] [Right:Yes] Extremity colors, hair growth, and conditions: Extremity Color: [Left:Hyperpigmented] [Right:Hyperpigmented] Hair Growth on Extremity: [Left:Yes] [Right:Yes] Temperature of Extremity: [Left:Warm] [Right:Warm] Capillary Refill: [Left:< 3 seconds] [Right:< 3 seconds] Dependent Rubor: [Left:No No] [Right:No No] Electronic Signature(s) Signed: 09/28/2023 4:57:48 PM By: Zenaida Deed RN, BSN Entered By: Zenaida Deed on 09/28/2023  10:34:39 -------------------------------------------------------------------------------- Multi Wound Chart Details Patient Name: Date of Service: Eduardo Ruder W. 09/28/2023 1:15 PM Medical Record Number: [id number] Patient Account Number: [id number] Date of Birth/Sex: Treating RN: 06/18/1971 (52 y.o. M) Primary Care Angeleen Horney: Saralyn Pilar Other Clinician: Referring Brooklyne Radke: Treating Kimori Tartaglia/Extender: Aldona Lento in Treatment: 9 Vital Signs Height(in): 77 Pulse(bpm): 77 Weight(lbs): 330 Blood Pressure(mmHg): 170/102 Body Mass Index(BMI): 39.1 Temperature(F): 98.9 Respiratory Rate(breaths/min): 18 [1:Photos:] [[id number] Page 3 of 11] Right Metatarsal head second Left Metatarsal head first Right, Distal, Plantar Foot Wound Location: Thermal Burn Gradually Appeared Blister Wounding  Event: Neuropathic Ulcer-Non Diabetic Neuropathic Ulcer-Non Diabetic Neuropathic Ulcer-Non Diabetic Primary Etiology: Lymphedema, Chronic Obstructive Lymphedema, Chronic Obstructive Lymphedema, Chronic Obstructive Comorbid History: Pulmonary Disease (COPD), Sleep Pulmonary Disease (COPD), Sleep Pulmonary Disease (COPD), Sleep Apnea, Deep Vein Thrombosis, Apnea, Deep Vein Thrombosis, Apnea, Deep Vein Thrombosis, Peripheral Arterial Disease, Peripheral Arterial Disease, Peripheral Arterial Disease, Osteoarthritis, Neuropathy Osteoarthritis, Neuropathy Osteoarthritis, Neuropathy 04/19/2023 05/20/2023 09/26/2023 Date Acquired: 9 9 0 Weeks of Treatment: Open Open Open Wound Status: No No No Wound Recurrence: 0.5x0.4x0.3 0.7x1.2x0.2 0.5x0.6x0.1 Measurements L x W x D (cm) 0.157 0.66 0.236 A (cm) : rea 0.047 0.132 0.024 Volume (cm) : 92.80% -50.00% [name] % Reduction in A rea: 89.20% -50.00% [name] % Reduction in Volume: 12 Starting Position 1 (o'clock): 12 Ending Position 1 (o'clock): 0.4 Maximum Distance 1 (cm): Yes No  No Undermining: Full Thickness Without Exposed Full Thickness Without Exposed Full Thickness Without Exposed Classification: Support Structures Support Structures Support Structures Medium Medium Medium Exudate A mount: Serosanguineous Serosanguineous Serosanguineous Exudate Type: red, brown red, brown red, brown Exudate Color: Distinct, outline attached Distinct, outline attached Flat and Intact Wound Margin: Medium (34-66%) Large (67-100%) Large (67-100%) Granulation A mount: Red, Pale Red, Pink Red Granulation Quality: Medium (34-66%) Small (1-33%) None Present (0%) Necrotic A mount: Fat Layer (Subcutaneous Tissue): Yes Fat Layer (Subcutaneous Tissue): Yes Fat Layer (Subcutaneous Tissue): Yes Exposed Structures: Fascia: No Fascia: No Fascia: No Tendon: No Tendon: No Tendon: No Muscle: No Muscle: No Muscle: No Joint: No Joint: No Joint: No Bone: No Bone: No Bone: No Small (1-33%) Small (1-33%) Small (1-33%) Epithelialization: Debridement - Excisional Debridement - Excisional [name] Debridement: Pre-procedure Verification/Time Out 14:00 14:00 [name] Taken: Lidocaine 4% Topical Solution Lidocaine 4% Topical Solution [name] Pain Control: Callus, Subcutaneous Callus, Subcutaneous, Slough [name] Tissue Debrided: Skin/Subcutaneous Tissue Skin/Subcutaneous Tissue [name] Level: 0.38 0.66 [name] Debridement A (sq cm): rea Curette Curette [name] Instrument: Minimum Minimum [name] Bleeding: Pressure Pressure [name] Hemostasis A chieved: 0 0 [name] Procedural Pain: 0 0 [name] Post Procedural Pain: Procedure was tolerated well Procedure was tolerated well [name] Debridement Treatment Response: 0.7x0.7x0.3 0.7x1.2x0.2 [name] Post Debridement Measurements L x W x D (cm) 0.115 0.132 [name] Post Debridement Volume: (cm) Callus: Yes Callus: Yes Callus: Yes Periwound Skin Texture: No Abnormalities Noted No Abnormalities Noted No Abnormalities Noted Periwound Skin Moisture: No Abnormalities Noted No  Abnormalities Noted No Abnormalities Noted Periwound Skin Color: No Abnormality No Abnormality No Abnormality Temperature: Debridement Debridement [name] Procedures Performed: Treatment Notes Electronic Signature(s) Signed: 09/29/2023 10:03:28 AM By: Baltazar Najjar MD Entered By: Baltazar Najjar on 09/28/2023 11:20:05 Eduardo [name] ([id number]) [id number] Page 4 of 11 -------------------------------------------------------------------------------- Multi-Disciplinary Care Plan Details Patient Name: Date of Service: Eduardo [name] 09/28/2023 1:15 PM Medical Record Number: [id number] Patient Account Number: [id number] Date of Birth/Sex: Treating RN: 1971-05-25 (52 y.o. [name] Primary Care Tadarius Maland: Saralyn Pilar Other Clinician: Referring Antwone Capozzoli: Treating Jaelon Gatley/Extender: Aldona Lento in Treatment: 9 Multidisciplinary Care Plan reviewed with physician Active Inactive Wound/Skin Impairment Nursing Diagnoses: Impaired tissue integrity Knowledge deficit related to ulceration/compromised skin integrity Goals: Patient will demonstrate a reduced rate of smoking or cessation of smoking Date Initiated: 07/22/2023 Target Resolution Date: 10/22/2023 Goal Status: Active Patient/caregiver will verbalize understanding of skin care regimen Date Initiated: 07/22/2023 Target Resolution Date: 10/22/2023 Goal Status: Active Interventions: Assess patient/caregiver ability to obtain necessary supplies Assess patient/caregiver ability to perform ulcer/skin care regimen upon admission and as needed Assess ulceration(s) every visit Provide education on smoking Treatment Activities:  Referred to DME Dorissa Stinnette for dressing supplies : 07/22/2023 Skin care regimen initiated : 07/22/2023 Topical wound management initiated : 07/22/2023 Notes: Electronic Signature(s) Signed: 09/28/2023 4:57:48 PM By: Zenaida Deed RN,  BSN Entered By: Zenaida Deed on 09/28/2023 10:27:50 -------------------------------------------------------------------------------- Pain Assessment Details Patient Name: Date of Service: Eduardo Ruder W. 09/28/2023 1:15 PM Medical Record Number: [id number] Patient Account Number: [id number] Date of Birth/Sex: Treating RN: [birth year] (52 y.o. [name] Primary Care Mateya Torti: Saralyn Pilar Other Clinician: Referring Mathis Cashman: Treating Rejeana Fadness/Extender: Aldona Lento in Treatment: 9 Active Problems Location of Pain Severity and Description of Pain Patient Has Paino Yes Site Locations Pain Location: LYNCH, GUCKERT ([id number]) [phone].pdf Page 5 of 11 Pain Location: Generalized Pain With Dressing Change: No Rate the pain. Current Pain Level: 7 Character of Pain Describe the Pain: Aching Pain Management and Medication Current Pain Management: Medication: Yes Notes reports chronic back pain Electronic Signature(s) Signed: 09/28/2023 4:57:48 PM By: Zenaida Deed RN, BSN Entered By: Zenaida Deed on 09/28/2023 10:34:05 -------------------------------------------------------------------------------- Patient/Caregiver Education Details Patient Name: Date of Service: Eduardo [name] 12/10/2024andnbsp1:15 PM Medical Record Number: [id number] Patient Account Number: [id number] Date of Birth/Gender: Treating RN: 1971/06/29 (52 y.o. [name] Primary Care Physician: Saralyn Pilar Other Clinician: Referring Physician: Treating Physician/Extender: Aldona Lento in Treatment: 9 Education Assessment Education Provided To: Patient Education Topics Provided Offloading: Methods: Explain/Verbal Responses: Reinforcements needed, State content correctly Peripheral Neuropathy: Methods: Explain/Verbal Responses: Reinforcements needed, State content  correctly Wound/Skin Impairment: Methods: Explain/Verbal Responses: Reinforcements needed, State content correctly Electronic Signature(s) Signed: 09/28/2023 4:57:48 PM By: Zenaida Deed RN, BSN Entered By: Zenaida Deed on 09/28/2023 64:40:34 Eduardo [name] ([id number]) [id number] Page 6 of 11 -------------------------------------------------------------------------------- Wound Assessment Details Patient Name: Date of Service: Eduardo [name] 09/28/2023 1:15 PM Medical Record Number: [id number] Patient Account Number: [id number] Date of Birth/Sex: Treating RN: 03-16-1971 (52 y.o. [name] Primary Care Kamarii Carton: Saralyn Pilar Other Clinician: Referring Timur Nibert: Treating Joanell Cressler/Extender: Aldona Lento in Treatment: 9 Wound Status Wound Number: 1 Primary Neuropathic Ulcer-Non Diabetic Etiology: Wound Location: Right Metatarsal head second Wound Open Wounding Event: Thermal Burn Status: Date Acquired: 04/19/2023 Comorbid Lymphedema, Chronic Obstructive Pulmonary Disease (COPD), Weeks Of Treatment: 9 History: Sleep Apnea, Deep Vein Thrombosis, Peripheral Arterial Disease, Clustered Wound: No Osteoarthritis, Neuropathy Photos Wound Measurements Length: (cm) 0.5 Width: (cm) 0.4 Depth: (cm) 0.3 Area: (cm) 0.157 Volume: (cm) 0.047 % Reduction in Area: 92.8% % Reduction in Volume: 89.2% Epithelialization: Small (1-33%) Tunneling: No Undermining: Yes Starting Position (o'clock): 12 Ending Position (o'clock): 12 Maximum Distance: (cm) 0.4 Wound Description Classification: Full Thickness Without Exposed Support Wound Margin: Distinct, outline attached Exudate Amount: Medium Exudate Type: Serosanguineous Exudate Color: red, brown Structures Foul Odor After Cleansing: No Slough/Fibrino Yes Wound Bed Granulation Amount: Medium (34-66%) Exposed Structure Granulation Quality: Red, Pale Fascia  Exposed: No Necrotic Amount: Medium (34-66%) Fat Layer (Subcutaneous Tissue) Exposed: Yes Necrotic Quality: Adherent Slough Tendon Exposed: No Muscle Exposed: No Joint Exposed: No Bone Exposed: No Periwound Skin Texture Texture Color No Abnormalities Noted: No No Abnormalities Noted: Yes Callus: Yes Temperature / Pain Temperature: No Abnormality Moisture No Abnormalities NotedJASMEET, Eduardo [name] ([id number]) [id number] Page 7 of 11 Treatment Notes Wound #1 (Metatarsal head second) Wound Laterality: Right Cleanser Normal Saline Discharge Instruction: Cleanse the wound with Normal Saline prior to applying a clean dressing using gauze sponges, not tissue or cotton balls. Soap  and Water Discharge Instruction: May shower and wash wound with dial antibacterial soap and water prior to dressing change. Wound Cleanser Discharge Instruction: Cleanse the wound with wound cleanser prior to applying a clean dressing using gauze sponges, not tissue or cotton balls. Peri-Wound Care Topical Silvadene Cream Discharge Instruction: Apply thin layer to wound bed only Primary Dressing Maxorb Extra Ag+ Alginate Dressing, 2x2 (in/in) Discharge Instruction: Apply to wound bed as instructed Secondary Dressing Woven Gauze Sponge, Non-Sterile 4x4 in Discharge Instruction: Apply over primary dressing as directed. Secured With Conforming Stretch Gauze Bandage, Sterile 2x75 (in/in) Discharge Instruction: Secure with stretch gauze as directed. Compression Wrap Compression Stockings Add-Ons Electronic Signature(s) Signed: 09/28/2023 4:57:48 PM By: Zenaida Deed RN, BSN Entered By: Zenaida Deed on 09/28/2023 10:43:13 -------------------------------------------------------------------------------- Wound Assessment Details Patient Name: Date of Service: Eduardo Ruder W. 09/28/2023 1:15 PM Medical Record Number: [id number] Patient Account Number:  [id number] Date of Birth/Sex: Treating RN: 1971-08-14 (52 y.o. [name] Primary Care Coe Angelos: Saralyn Pilar Other Clinician: Referring Zaylon Bossier: Treating Contina Strain/Extender: Aldona Lento in Treatment: 9 Wound Status Wound Number: 2 Primary Neuropathic Ulcer-Non Diabetic Etiology: Wound Location: Left Metatarsal head first Wound Open Wounding Event: Gradually Appeared Status: Date Acquired: 05/20/2023 Comorbid Lymphedema, Chronic Obstructive Pulmonary Disease (COPD), Weeks Of Treatment: 9 History: Sleep Apnea, Deep Vein Thrombosis, Peripheral Arterial Disease, Clustered Wound: No Osteoarthritis, Neuropathy Photos Eduardo [name], Eduardo [name] ([id number]) [id number] Page 8 of 11 Wound Measurements Length: (cm) 0.7 Width: (cm) 1.2 Depth: (cm) 0.2 Area: (cm) 0.66 Volume: (cm) 0.132 % Reduction in Area: -50% % Reduction in Volume: -50% Epithelialization: Small (1-33%) Tunneling: No Undermining: No Wound Description Classification: Full Thickness Without Exposed Support Structures Wound Margin: Distinct, outline attached Exudate Amount: Medium Exudate Type: Serosanguineous Exudate Color: red, brown Foul Odor After Cleansing: No Slough/Fibrino Yes Wound Bed Granulation Amount: Large (67-100%) Exposed Structure Granulation Quality: Red, Pink Fascia Exposed: No Necrotic Amount: Small (1-33%) Fat Layer (Subcutaneous Tissue) Exposed: Yes Necrotic Quality: Adherent Slough Tendon Exposed: No Muscle Exposed: No Joint Exposed: No Bone Exposed: No Periwound Skin Texture Texture Color No Abnormalities Noted: No No Abnormalities Noted: Yes Callus: Yes Temperature / Pain Temperature: No Abnormality Moisture No Abnormalities Noted: Yes Treatment Notes Wound #2 (Metatarsal head first) Wound Laterality: Left Cleanser Normal Saline Discharge Instruction: Cleanse the wound with Normal Saline prior to applying a clean dressing  using gauze sponges, not tissue or cotton balls. Soap and Water Discharge Instruction: May shower and wash wound with dial antibacterial soap and water prior to dressing change. Wound Cleanser Discharge Instruction: Cleanse the wound with wound cleanser prior to applying a clean dressing using gauze sponges, not tissue or cotton balls. Peri-Wound Care Topical Silvadene Cream Discharge Instruction: Apply thin layer to wound bed only Primary Dressing Maxorb Extra Ag+ Alginate Dressing, 2x2 (in/in) Discharge Instruction: Apply to wound bed as instructed Secondary Dressing Woven Gauze Sponge, Non-Sterile 4x4 in Discharge Instruction: Apply over primary dressing as directed. Secured With Conforming Stretch Gauze Bandage, Sterile 2x75 (in/in) Discharge Instruction: Secure with stretch gauze as directed. Eduardo [name], Eduardo [name] ([id number]) [id number] Page 9 of 11 Compression Wrap Compression Stockings Add-Ons Electronic Signature(s) Signed: 09/28/2023 4:57:48 PM By: Zenaida Deed RN, BSN Entered By: Zenaida Deed on 09/28/2023 10:43:41 -------------------------------------------------------------------------------- Wound Assessment Details Patient Name: Date of Service: Eduardo Ruder W. 09/28/2023 1:15 PM Medical Record Number: [id number] Patient Account Number: [id number] Date of Birth/Sex: Treating RN: 1971/07/11 (52 y.o. [name] Primary Care Jami Bogdanski: Saralyn Pilar  Other Clinician: Referring Addalynne Golding: Treating Fredericka Bottcher/Extender: Aldona Lento in Treatment: 9 Wound Status Wound Number: 4 Primary Neuropathic Ulcer-Non Diabetic Etiology: Wound Location: Right, Distal, Plantar Foot Wound Open Wounding Event: Blister Status: Date Acquired: 09/26/2023 Comorbid Lymphedema, Chronic Obstructive Pulmonary Disease (COPD), Weeks Of Treatment: 0 History: Sleep Apnea, Deep Vein Thrombosis, Peripheral Arterial  Disease, Clustered Wound: No Osteoarthritis, Neuropathy Photos Wound Measurements Length: (cm) 0.5 Width: (cm) 0.6 Depth: (cm) 0.1 Area: (cm) 0.236 Volume: (cm) 0.024 % Reduction in Area: % Reduction in Volume: Epithelialization: Small (1-33%) Tunneling: No Undermining: No Wound Description Classification: Full Thickness Without Exposed Support Structures Wound Margin: Flat and Intact Exudate Amount: Medium Exudate Type: Serosanguineous Exudate Color: red, brown Foul Odor After Cleansing: No Slough/Fibrino No Wound Bed Granulation Amount: Large (67-100%) Exposed Structure Granulation Quality: Red Fascia Exposed: No Necrotic Amount: None Present (0%) Fat Layer (Subcutaneous Tissue) Exposed: Yes Tendon Exposed: No Muscle Exposed: No Joint Exposed: No Bone Exposed: No [address] DENN[place], Eduardo [name] ([id number]) [id number] Page 10 of 11 No Abnormalities Noted: No No Abnormalities Noted: Yes Callus: Yes Temperature / Pain Temperature: No Abnormality Moisture No Abnormalities Noted: Yes Treatment Notes Wound #4 (Foot) Wound Laterality: Plantar, Right, Distal Cleanser Normal Saline Discharge Instruction: Cleanse the wound with Normal Saline prior to applying a clean dressing using gauze sponges, not tissue or cotton balls. Soap and Water Discharge Instruction: May shower and wash wound with dial antibacterial soap and water prior to dressing change. Wound Cleanser Discharge Instruction: Cleanse the wound with wound cleanser prior to applying a clean dressing using gauze sponges, not tissue or cotton balls. Peri-Wound Care Topical Silvadene Cream Discharge Instruction: Apply thin layer to wound bed only Primary Dressing Maxorb Extra Ag+ Alginate Dressing, 2x2 (in/in) Discharge Instruction: Apply to wound bed as instructed Secondary Dressing Woven Gauze Sponge, Non-Sterile 4x4 in Discharge Instruction: Apply over  primary dressing as directed. Secured With Conforming Stretch Gauze Bandage, Sterile 2x75 (in/in) Discharge Instruction: Secure with stretch gauze as directed. Compression Wrap Compression Stockings Add-Ons Electronic Signature(s) Signed: 09/28/2023 4:57:48 PM By: Zenaida Deed RN, BSN Entered By: Zenaida Deed on 09/28/2023 10:44:13 -------------------------------------------------------------------------------- Vitals Details Patient Name: Date of Service: Eduardo Ruder W. 09/28/2023 1:15 PM Medical Record Number: [id number] Patient Account Number: [id number] Date of Birth/Sex: Treating RN: [birth year] (52 y.o. [name] Primary Care Deztiny Sarra: Saralyn Pilar Other Clinician: Referring Virginie Josten: Treating Jorene Kaylor/Extender: Aldona Lento in Treatment: 9 Vital Signs Time Taken: 13:31 Temperature (F): 98.9 Height (in): 77 Pulse (bpm): 77 Weight (lbs): 330 Respiratory Rate (breaths/min): 18 Body Mass Index (BMI): 39.1 Blood Pressure (mmHg): 170/102 Reference Range: 80 - 120 mg / dl Electronic Signature(s) Signed: 09/28/2023 4:57:48 PM By: Zenaida Deed RN, BSN Entered By: Zenaida Deed on 09/28/2023 10:31:32 Eduardo [name] ([id number]) [id number] Page 11 of 11

## 2023-10-11 ENCOUNTER — Encounter (HOSPITAL_BASED_OUTPATIENT_CLINIC_OR_DEPARTMENT_OTHER): Payer: Medicaid Other | Admitting: General Surgery

## 2023-10-11 DIAGNOSIS — L97512 Non-pressure chronic ulcer of other part of right foot with fat layer exposed: Secondary | ICD-10-CM | POA: Diagnosis not present

## 2023-10-12 NOTE — Progress Notes (Signed)
DOYT, HETZEL ([id number]) [id number] Page 1 of 10 Visit Report for 10/11/2023 Arrival Information Details Patient Name: Date of Service: Eduardo [name] 10/11/2023 12:45 PM Medical Record Number: [id number] Patient Account Number: [id number] Date of Birth/Sex: Treating RN: 09-18-1971 (52 y.o. [name] Primary Care Javion Holmer: Saralyn Pilar Other Clinician: Referring Lashon Beringer: Treating Teresea Donley/Extender: Park Liter in Treatment: 11 Visit Information History Since Last Visit Added or deleted any medications: No Patient Arrived: Ambulatory Any new allergies or adverse reactions: No Arrival Time: 12:41 Had a fall or experienced change in No Accompanied By: self activities of daily living that may affect Transfer Assistance: None risk of falls: Patient Identification Verified: Yes Signs or symptoms of abuse/neglect since last visito No Secondary Verification Process Completed: Yes Hospitalized since last visit: No Patient Requires Transmission-Based Precautions: No Implantable device outside of the clinic excluding No Patient Has Alerts: Yes cellular tissue based products placed in the center Patient Alerts: ABIs: R:1.11 L:1.18 9/24 since last visit: TBIs: R:1.13 L:1.02 9/24 Has Dressing in Place as Prescribed: Yes Pain Present Now: Yes Electronic Signature(s) Signed: 10/11/2023 4:30:16 PM By: Zenaida Deed RN, BSN Entered By: Zenaida Deed on 10/11/2023 12:41:46 -------------------------------------------------------------------------------- Encounter Discharge Information Details Patient Name: Date of Service: Eduardo Ruder W. 10/11/2023 12:45 PM Medical Record Number: [id number] Patient Account Number: [id number] Date of Birth/Sex: Treating RN: Oct 31, 1970 (52 y.o. [name] Primary Care Westlynn Fifer: Saralyn Pilar Other Clinician: Referring Lawonda Pretlow: Treating Jalen Oberry/Extender:  Park Liter in Treatment: 11 Encounter Discharge Information Items Post Procedure Vitals Discharge Condition: Stable Temperature (F): 97.8 Ambulatory Status: Ambulatory Pulse (bpm): 82 Discharge Destination: Home Respiratory Rate (breaths/min): 20 Transportation: Private Auto Blood Pressure (mmHg): 158/98 Accompanied By: self Schedule Follow-up Appointment: Yes Clinical Summary of Care: Patient Declined Electronic Signature(s) Signed: 10/11/2023 4:30:16 PM By: Zenaida Deed RN, BSN Entered By: Zenaida Deed on 10/11/2023 13:16:26 Eduardo [name] ([id number]) [id number] Page 2 of 10 -------------------------------------------------------------------------------- Lower Extremity Assessment Details Patient Name: Date of Service: Eduardo [name] 10/11/2023 12:45 PM Medical Record Number: [id number] Patient Account Number: [id number] Date of Birth/Sex: Treating RN: 12/08/1970 (52 y.o. [name] Primary Care Eileen Croswell: Saralyn Pilar Other Clinician: Referring Beva Remund: Treating Aleya Durnell/Extender: Harrie Jeans Weeks in Treatment: 11 Edema Assessment Assessed: Kyra Searles: No] Franne Forts: No] Edema: [Left: Yes] [Right: Yes] Calf Left: Right: Point of Measurement: From Medial Instep 46 cm 49 cm Ankle Left: Right: Point of Measurement: From Medial Instep 26 cm 27 cm Vascular Assessment Pulses: Dorsalis Pedis Palpable: [Left:Yes] [Right:Yes] Extremity colors, hair growth, and conditions: Extremity Color: [Left:Hyperpigmented] [Right:Hyperpigmented] Hair Growth on Extremity: [Left:Yes] [Right:Yes] Temperature of Extremity: [Left:Warm] [Right:Warm] Capillary Refill: [Left:< 3 seconds] [Right:< 3 seconds] Dependent Rubor: [Left:No No] [Right:No No] Electronic Signature(s) Signed: 10/11/2023 4:30:16 PM By: Zenaida Deed RN, BSN Entered By: Zenaida Deed on 10/11/2023  12:47:58 -------------------------------------------------------------------------------- Multi Wound Chart Details Patient Name: Date of Service: Eduardo Ruder W. 10/11/2023 12:45 PM Medical Record Number: [id number] Patient Account Number: [id number] Date of Birth/Sex: Treating RN: [birth year] (52 y.o. 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 Primary Etiology: Lymphedema, Chronic Obstructive Lymphedema, Chronic Obstructive Lymphedema, Chronic Obstructive Comorbid History: Pulmonary Disease (COPD), Sleep Pulmonary Disease (COPD), Sleep Pulmonary Disease (COPD), Sleep Apnea, Deep Vein Thrombosis, Apnea, Deep Vein Thrombosis, Apnea, Deep Vein Thrombosis, Peripheral Arterial Disease, Peripheral Arterial Disease, Peripheral Arterial Disease, Osteoarthritis, Neuropathy Osteoarthritis, Neuropathy Osteoarthritis, Neuropathy 04/19/2023 05/20/2023 09/26/2023 Date Acquired: 11 11 1  Weeks of Treatment: Open Open Open Wound Status: No No No Wound Recurrence: 0.3x0.3x0.1 0.3x0.3x0.1 0x0x0 Measurements L x W x D (cm) 0.071 0.071 0 A (cm) : rea 0.007 0.007 0 Volume (cm) : 96.70% 83.90% 100.00% % Reduction in A rea: 98.40% 92.00% 100.00% % Reduction in Volume: Full Thickness Without Exposed Full Thickness Without Exposed Full Thickness Without Exposed Classification: Support  Structures Support Structures Support Structures Medium Medium None Present Exudate A mount: Serosanguineous Serosanguineous [name] Exudate Type: red, brown red, brown [name] Exudate Color: Distinct, outline attached Distinct, outline attached [name] Wound Margin: Large (67-100%) Large (67-100%) None Present (0%) Granulation A mount: Red Red [name] Granulation Quality: Small (1-33%) Small (1-33%) None Present (0%) Necrotic A mount: Fat Layer (Subcutaneous Tissue): Yes Fat Layer (Subcutaneous Tissue): Yes Fascia: No Exposed Structures: Fascia: No Fascia: No Fat Layer (Subcutaneous Tissue): No Tendon: No Tendon: No Tendon: No Muscle: No Muscle: No Muscle: No Joint: No Joint: No Joint: No Bone: No Bone: No Bone: No Small (1-33%) Small (1-33%) Large (67-100%) Epithelialization: Debridement - Excisional Debridement - Excisional [name] Debridement: Pre-procedure Verification/Time Out 13:00 13:00 [name] Taken: Lidocaine 4% Topical Solution Lidocaine 4% Topical Solution [name] Pain Control: Callus, Subcutaneous, Slough Subcutaneous, Slough [name] Tissue Debrided: Skin/Subcutaneous Tissue Skin/Subcutaneous Tissue [name] Level: 0.2 0.12 [name] Debridement A (sq cm): rea Curette Curette [name] Instrument: Minimum Minimum [name] Bleeding: Pressure Pressure [name] Hemostasis A chieved: 0 0 [name] Procedural Pain: 0 0 [name] Post Procedural Pain: Procedure was tolerated well Procedure was tolerated well [name] Debridement Treatment Response: 0.5x0.5x0.1 0.3x0.5x0.1 [name] Post Debridement Measurements L x W x D (cm) 0.02 0.012 [name] Post Debridement Volume: (cm) Callus: Yes Callus: Yes Callus: Yes Periwound Skin Texture: No Abnormalities Noted No Abnormalities Noted No Abnormalities Noted Periwound Skin Moisture: No Abnormalities Noted No Abnormalities Noted No Abnormalities Noted Periwound Skin Color: No Abnormality No Abnormality No Abnormality Temperature: Debridement Debridement [name] Procedures  Performed: Treatment Notes Electronic Signature(s) Signed: 10/11/2023 1:20:59 PM By: Duanne Guess MD FACS Entered By: Duanne Guess on 10/11/2023 13:11:50 -------------------------------------------------------------------------------- Multi-Disciplinary Care Plan Details Patient Name: Date of Service: Eduardo Ruder W. 10/11/2023 12:45 PM Medical Record Number: [id number] Patient Account Number: [id number] BIRD, LAMARQUE ([id number]) [id number] Page 4 of 10 Date of Birth/Sex: Treating RN: Apr 29, 1971 (52 y.o. [name] Primary Care Darrielle Pflieger: Saralyn Pilar Other Clinician: Referring Mirna Sutcliffe: Treating Lamiyah Schlotter/Extender: Park Liter in Treatment: 11 Multidisciplinary Care Plan reviewed with physician Active Inactive Wound/Skin Impairment Nursing Diagnoses: Impaired tissue integrity Knowledge deficit related to ulceration/compromised skin integrity Goals: Patient will demonstrate a reduced rate of smoking or cessation of smoking Date Initiated: 07/22/2023 Target Resolution Date: 10/22/2023 Goal Status: Active Patient/caregiver will verbalize understanding of skin care regimen Date Initiated: 07/22/2023 Target Resolution Date: 10/22/2023 Goal Status: Active Interventions: Assess patient/caregiver ability to obtain necessary supplies Assess patient/caregiver ability to perform ulcer/skin care regimen upon admission and as needed Assess ulceration(s) every visit Provide education on smoking Treatment Activities: Referred to DME Karilyn Wind for dressing supplies : 07/22/2023 Skin care regimen initiated : 07/22/2023 Topical wound management initiated : 07/22/2023  Notes: Electronic Signature(s) Signed: 10/11/2023 4:30:16 PM By: Zenaida Deed RN, BSN Entered By: Zenaida Deed on 10/11/2023 12:53:27 -------------------------------------------------------------------------------- Pain Assessment  Details Patient Name: Date of Service: Eduardo Ruder W. 10/11/2023 12:45 PM Medical Record Number: [id number] Patient Account Number: [id number] Date of Birth/Sex: Treating RN: 10/19/1971 (52 y.o. [name] Primary Care Finnegan Gatta: Saralyn Pilar Other Clinician: Referring Emonni Depasquale: Treating Kortnee Bas/Extender: Harrie Jeans Weeks in Treatment: 11 Active Problems Location of Pain Severity and Description of Pain Patient Has Paino Yes Site Locations Pain Location: TIMOTH, MACHIDA ([id number]) [id number] Page 5 of 10 Pain Location: Generalized Pain With Dressing Change: No Duration of the Pain. Constant / Intermittento Constant Rate the pain. Current Pain Level: 7 Worst Pain Level: 7 Least Pain Level: 4 Character of Pain Describe the Pain: Aching Pain Management and Medication Current Pain Management: Medication: Yes Is the Current Pain Management Adequate: Adequate How does your wound impact your activities of daily livingo Sleep: No Bathing: No Appetite: No Relationship With Others: No Bladder Continence: No Emotions: No Bowel Continence: No Work: No Toileting: No Drive: No Dressing: No Hobbies: No Notes reports chronic generalized pain Electronic Signature(s) Signed: 10/11/2023 4:30:16 PM By: Zenaida Deed RN, BSN Entered By: Zenaida Deed on 10/11/2023 12:45:21 -------------------------------------------------------------------------------- Patient/Caregiver Education Details Patient Name: Date of Service: Eduardo [name] 12/23/2024andnbsp12:45 PM Medical Record Number: [id number] Patient Account Number: [id number] Date of Birth/Gender: Treating RN: [birth year] (52 y.o. [name] Primary Care Physician: Saralyn Pilar Other Clinician: Referring Physician: Treating Physician/Extender: Park Liter in Treatment: 11 Education Assessment Education  Provided To: Patient Education Topics Provided Offloading: Methods: Explain/Verbal Responses: Reinforcements needed, State content correctly Smoking and Wound Healing: Methods: Explain/Verbal Responses: Reinforcements needed, State content correctly Wound/Skin Impairment: Methods: Explain/Verbal Eduardo [name] ([id number]) [id number] Page 6 of 10 Responses: Reinforcements needed, State content correctly Electronic Signature(s) Signed: 10/11/2023 4:30:16 PM By: Zenaida Deed RN, BSN Entered By: Zenaida Deed on 10/11/2023 12:54:01 -------------------------------------------------------------------------------- Wound Assessment Details Patient Name: Date of Service: Eduardo Ruder W. 10/11/2023 12:45 PM Medical Record Number: [id number] Patient Account Number: [id number] Date of Birth/Sex: Treating RN: Nov 07, 1970 (52 y.o. [name] Primary Care Leotta Weingarten: Saralyn Pilar Other Clinician: Referring Hanz Winterhalter: Treating Delane Stalling/Extender: Harrie Jeans Weeks in Treatment: 11 Wound Status Wound Number: 1 Primary Neuropathic Ulcer-Non Diabetic Etiology: Wound Location: Right Metatarsal head second Wound Open Wounding Event: Thermal Burn Status: Date Acquired: 04/19/2023 Comorbid Lymphedema, Chronic Obstructive Pulmonary Disease (COPD), Weeks Of Treatment: 11 History: Sleep Apnea, Deep Vein Thrombosis, Peripheral Arterial Disease, Clustered Wound: No Osteoarthritis, Neuropathy Photos Wound Measurements Length: (cm) 0.3 Width: (cm) 0.3 Depth: (cm) 0.1 Area: (cm) 0.071 Volume: (cm) 0.007 % Reduction in Area: 96.7% % Reduction in Volume: 98.4% Epithelialization: Small (1-33%) Tunneling: No Undermining: No Wound Description Classification: Full Thickness Without Exposed Support Wound Margin: Distinct, outline attached Exudate Amount: Medium Exudate Type: Serosanguineous Exudate Color: red, brown Structures  Foul Odor After Cleansing: No Slough/Fibrino Yes Wound Bed Granulation Amount: Large (67-100%) Exposed Structure Granulation Quality: Red Fascia Exposed: No Necrotic Amount: Small (1-33%) Fat Layer (Subcutaneous Tissue) Exposed: Yes Necrotic Quality: Adherent Slough Tendon Exposed: No Muscle Exposed: No Joint Exposed: No Bone Exposed: No Periwound Skin Texture Texture Color No Abnormalities Noted: No No Abnormalities Noted: Yes CallusADHRIT, CACCAVALE ([id number]) [id number] Page 7 of 10 Callus: Yes Temperature / Pain Temperature: No Abnormality Moisture No Abnormalities Noted: Yes Treatment Notes Wound #1 (Metatarsal  head second) Wound Laterality: Right Cleanser Normal Saline Discharge Instruction: Cleanse the wound with Normal Saline prior to applying a clean dressing using gauze sponges, not tissue or cotton balls. Soap and Water Discharge Instruction: May shower and wash wound with dial antibacterial soap and water prior to dressing change. Wound Cleanser Discharge Instruction: Cleanse the wound with wound cleanser prior to applying a clean dressing using gauze sponges, not tissue or cotton balls. Peri-Wound Care Topical Silvadene Cream Discharge Instruction: Apply thin layer to wound bed only Primary Dressing Maxorb Extra Ag+ Alginate Dressing, 2x2 (in/in) Discharge Instruction: Apply to wound bed as instructed Secondary Dressing Woven Gauze Sponge, Non-Sterile 4x4 in Discharge Instruction: Apply over primary dressing as directed. Secured With Conforming Stretch Gauze Bandage, Sterile 2x75 (in/in) Discharge Instruction: Secure with stretch gauze as directed. Compression Wrap Compression Stockings Add-Ons Electronic Signature(s) Signed: 10/11/2023 4:30:16 PM By: Zenaida Deed RN, BSN Entered By: Zenaida Deed on 10/11/2023 12:51:51 -------------------------------------------------------------------------------- Wound  Assessment Details Patient Name: Date of Service: Eduardo Ruder W. 10/11/2023 12:45 PM Medical Record Number: [id number] Patient Account Number: [id number] Date of Birth/Sex: Treating RN: [birth year] (52 y.o. [name] Primary Care Jadriel Saxer: Saralyn Pilar Other Clinician: Referring Gracelee Stemmler: Treating Allycia Pitz/Extender: Harrie Jeans Weeks in Treatment: 11 Wound Status Wound Number: 2 Primary Neuropathic Ulcer-Non Diabetic Etiology: Wound Location: Left Metatarsal head first Wound Open Wounding Event: Gradually Appeared Status: Date Acquired: 05/20/2023 Comorbid Lymphedema, Chronic Obstructive Pulmonary Disease (COPD), Weeks Of Treatment: 11 History: Sleep Apnea, Deep Vein Thrombosis, Peripheral Arterial Disease, Clustered Wound: No Osteoarthritis, Neuropathy Photos ZAKI, PLASKY ([id number]) [id number] Page 8 of 10 Wound Measurements Length: (cm) 0.3 Width: (cm) 0.3 Depth: (cm) 0.1 Area: (cm) 0.071 Volume: (cm) 0.007 % Reduction in Area: 83.9% % Reduction in Volume: 92% Epithelialization: Small (1-33%) Tunneling: No Undermining: No Wound Description Classification: Full Thickness Without Exposed Support Structures Wound Margin: Distinct, outline attached Exudate Amount: Medium Exudate Type: Serosanguineous Exudate Color: red, brown Foul Odor After Cleansing: No Slough/Fibrino Yes Wound Bed Granulation Amount: Large (67-100%) Exposed Structure Granulation Quality: Red Fascia Exposed: No Necrotic Amount: Small (1-33%) Fat Layer (Subcutaneous Tissue) Exposed: Yes Necrotic Quality: Adherent Slough Tendon Exposed: No Muscle Exposed: No Joint Exposed: No Bone Exposed: No Periwound Skin Texture Texture Color No Abnormalities Noted: No No Abnormalities Noted: Yes Callus: Yes Temperature / Pain Temperature: No Abnormality Moisture No Abnormalities Noted: Yes Treatment Notes Wound #2 (Metatarsal  head first) Wound Laterality: Left Cleanser Normal Saline Discharge Instruction: Cleanse the wound with Normal Saline prior to applying a clean dressing using gauze sponges, not tissue or cotton balls. Soap and Water Discharge Instruction: May shower and wash wound with dial antibacterial soap and water prior to dressing change. Wound Cleanser Discharge Instruction: Cleanse the wound with wound cleanser prior to applying a clean dressing using gauze sponges, not tissue or cotton balls. Peri-Wound Care Topical Silvadene Cream Discharge Instruction: Apply thin layer to wound bed only Primary Dressing Maxorb Extra Ag+ Alginate Dressing, 2x2 (in/in) Discharge Instruction: Apply to wound bed as instructed Secondary Dressing Woven Gauze Sponge, Non-Sterile 4x4 in Discharge Instruction: Apply over primary dressing as directed. Secured With Conforming Stretch Gauze Bandage, Sterile 2x75 (in/in) Discharge Instruction: Secure with stretch gauze as directed. Eduardo [name], Eduardo [name] ([id number]) [id number] Page 9 of 10 Compression Wrap Compression Stockings Add-Ons Electronic Signature(s) Signed: 10/11/2023 4:30:16 PM By: Zenaida Deed RN, BSN Entered By: Zenaida Deed on 10/11/2023 12:52:34 -------------------------------------------------------------------------------- Wound Assessment Details Patient Name: Date of Service: Gerline Legacy,  Karie Soda 10/11/2023 12:45 PM Medical Record Number: [id number] Patient Account Number: [id number] Date of Birth/Sex: Treating RN: Nov 02, 1970 (52 y.o. [name] Primary Care Jay Haskew: Saralyn Pilar Other Clinician: Referring Shayde Gervacio: Treating Booker Bhatnagar/Extender: Harrie Jeans Weeks in Treatment: 11 Wound Status Wound Number: 4 Primary Neuropathic Ulcer-Non Diabetic Etiology: Wound Location: Right, Distal, Plantar Foot Wound Open Wounding Event: Blister Status: Date Acquired:  09/26/2023 Comorbid Lymphedema, Chronic Obstructive Pulmonary Disease (COPD), Weeks Of Treatment: 1 History: Sleep Apnea, Deep Vein Thrombosis, Peripheral Arterial Disease, Clustered Wound: No Osteoarthritis, Neuropathy Photos Wound Measurements Length: (cm) Width: (cm) Depth: (cm) Area: (cm) Volume: (cm) 0 % Reduction in Area: 100% 0 % Reduction in Volume: 100% 0 Epithelialization: Large (67-100%) 0 Tunneling: No 0 Undermining: No Wound Description Classification: Full Thickness Without Exposed Support Structures Exudate Amount: None Present Foul Odor After Cleansing: No Slough/Fibrino No Wound Bed Granulation Amount: None Present (0%) Exposed Structure Necrotic Amount: None Present (0%) Fascia Exposed: No Fat Layer (Subcutaneous Tissue) Exposed: No Tendon Exposed: No Muscle Exposed: No Joint Exposed: No Bone Exposed: No Periwound Skin Texture Texture Color No Abnormalities Noted: No No Abnormalities Noted: Yes Callus: Yes Temperature / Pain Eduardo [name] ([id number]) [id number] Page 10 of 10 Temperature: No Abnormality Moisture No Abnormalities Noted: Yes Electronic Signature(s) Signed: 10/11/2023 4:30:16 PM By: Zenaida Deed RN, BSN Entered By: Zenaida Deed on 10/11/2023 12:53:06 -------------------------------------------------------------------------------- Vitals Details Patient Name: Date of Service: Eduardo Ruder W. 10/11/2023 12:45 PM Medical Record Number: [id number] Patient Account Number: [id number] Date of Birth/Sex: Treating RN: 12/20/1970 (52 y.o. [name] Primary Care Candus Braud: Saralyn Pilar Other Clinician: Referring Presleigh Feldstein: Treating Ameris Akamine/Extender: Harrie Jeans Weeks in Treatment: 11 Vital Signs Time Taken: 12:42 Temperature (F): 97.8 Height (in): 77 Pulse (bpm): 82 Weight (lbs): 330 Respiratory Rate (breaths/min): 20 Body Mass Index (BMI): 39.1 Blood  Pressure (mmHg): 158/98 Reference Range: 80 - 120 mg / dl Electronic Signature(s) Signed: 10/11/2023 4:30:16 PM By: Zenaida Deed RN, BSN Entered By: Zenaida Deed on 10/11/2023 12:43:19

## 2023-10-12 NOTE — Progress Notes (Signed)
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 dressing changes Off-Loading: Open toe surgical shoe to: - to both feet with peg assist inserts Additional Orders / Instructions: Stop/Decrease Smoking Non Wound Condition: Apply the following to affected area as directed: - 40% urea cream to callouses daily (not in wound beds) WOUND #1: - Metatarsal head second Wound Laterality:  Right Cleanser: Normal Saline 1 x Per Day/30 Days Discharge Instructions: Cleanse the wound with Normal Saline prior to applying a clean dressing using gauze sponges, not tissue or  cotton balls. Cleanser: Soap and Water 1 x Per Day/30 Days Discharge Instructions: May shower and wash wound with dial antibacterial soap and water prior to dressing change. Cleanser: Wound Cleanser 1 x Per Day/30 Days Discharge Instructions: Cleanse the wound with wound cleanser prior to applying a clean dressing using gauze sponges, not tissue or cotton balls. Topical: Silvadene Cream 1 x Per Day/30 Days Discharge Instructions: Apply thin layer to wound bed only Prim Dressing: Maxorb Extra Ag+ Alginate Dressing, 2x2 (in/in) 1 x Per Day/30 Days ary Discharge Instructions: Apply to wound bed as instructed Secondary Dressing: Woven Gauze Sponge, Non-Sterile 4x4 in 1 x Per Day/30 Days Discharge Instructions: Apply over primary dressing as directed. Secured With: Insurance underwriter, Sterile 2x75 (in/in) 1 x Per Day/30 Days Discharge Instructions: Secure with stretch gauze as directed. WOUND #2: - Metatarsal head first Wound Laterality: Left Cleanser: Normal Saline 1 x Per Day/30 Days Discharge Instructions: Cleanse the wound with Normal Saline prior to applying a clean dressing using gauze sponges, not tissue or cotton balls. Cleanser: Soap and Water 1 x Per Day/30 Days Discharge Instructions: May shower and wash wound with dial antibacterial soap and water prior to dressing change. Cleanser: Wound Cleanser 1 x Per Day/30 Days Discharge Instructions: Cleanse the wound with wound cleanser prior to applying a clean dressing using gauze sponges, not tissue or cotton balls. Topical: Silvadene Cream 1 x Per Day/30 Days Discharge Instructions: Apply thin layer to wound bed only Prim Dressing: Maxorb Extra Ag+ Alginate Dressing, 2x2 (in/in) 1 x Per Day/30 Days ary Discharge Instructions: Apply to wound bed as instructed Secondary Dressing: Woven Gauze Sponge, Non-Sterile 4x4 in 1 x Per Day/30 Days Discharge Instructions: Apply over primary dressing as directed. Secured With:  Insurance underwriter, Sterile 2x75 (in/in) 1 x Per Day/30 Days Discharge Instructions: Secure with stretch gauze as directed. 10/11/2023: His wounds are smaller. There is some callus accumulation, but less depth. No erythema, induration, malodor, or purulent drainage to suggest infection. I used a curette to debride callus, slough, and subcutaneous tissue from the wounds. The patient is using Silvadene (his request) and silver alginate on his wounds and they do seem to be improving. Will continue with his current regimen. He has peg assist inserts for offloading. He will follow-up in about a week. Electronic Signature(s) Signed: 10/11/2023 1:20:59 PM By: Duanne Guess MD FACS Entered By: Duanne Guess on 10/11/2023 13:18:26 -------------------------------------------------------------------------------- SuperBill Details Patient Name: Date of Service: Eduardo [name] 10/11/2023 Medical Record Number: [id number] Patient Account Number: [id number] Date of Birth/Sex: Treating RN: September 21, 1971 (52 y.o. 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 G90.09 Other idiopathic peripheral autonomic neuropathy Eduardo [name], Eduardo [name] ([id number]) [id number] Page 9 of 9 Z72.0 Tobacco use E66.01 Morbid (severe) obesity due to excess calories Facility Procedures : CPT4 Code: [id number] Description: 11042 - DEB SUBQ TISSUE 20 SQ CM/< ICD-10 Diagnosis Description L97.512 Non-pressure chronic ulcer of other part of right foot with fat layer exposed L97.522 Non-pressure chronic ulcer of other part of left foot with  fat layer  exposed Modifier: Quantity: 1 Physician Procedures : CPT4 Code Description Modifier [id number] - WC PHYS LEVEL 4 - EST PT ICD-10 Diagnosis Description L97.512 Non-pressure chronic ulcer of other part of right foot with fat layer exposed L97.522 Non-pressure chronic ulcer of other part of left foot  with fat layer exposed G90.09 Other idiopathic peripheral autonomic neuropathy Z72.0 Tobacco use Quantity: 1 : [id number] 11042 - WC PHYS SUBQ TISS 20 SQ CM ICD-10 Diagnosis Description L97.512 Non-pressure chronic ulcer of other part of right foot with fat layer exposed L97.522 Non-pressure chronic ulcer of other part of left foot with fat layer exposed Quantity: 1 Electronic Signature(s) Signed: 10/11/2023 1:20:59 PM By: Duanne Guess MD FACS Entered By: Duanne Guess on 10/11/2023 13:19:15

## 2023-10-22 ENCOUNTER — Encounter (HOSPITAL_BASED_OUTPATIENT_CLINIC_OR_DEPARTMENT_OTHER): Payer: Medicaid Other | Attending: General Surgery | Admitting: General Surgery

## 2023-10-22 DIAGNOSIS — L98492 Non-pressure chronic ulcer of skin of other sites with fat layer exposed: Secondary | ICD-10-CM | POA: Insufficient documentation

## 2023-10-22 DIAGNOSIS — F17210 Nicotine dependence, cigarettes, uncomplicated: Secondary | ICD-10-CM | POA: Insufficient documentation

## 2023-10-22 DIAGNOSIS — L97522 Non-pressure chronic ulcer of other part of left foot with fat layer exposed: Secondary | ICD-10-CM | POA: Insufficient documentation

## 2023-10-22 DIAGNOSIS — Z6839 Body mass index (BMI) 39.0-39.9, adult: Secondary | ICD-10-CM | POA: Insufficient documentation

## 2023-10-22 DIAGNOSIS — L84 Corns and callosities: Secondary | ICD-10-CM | POA: Diagnosis not present

## 2023-10-22 DIAGNOSIS — L97512 Non-pressure chronic ulcer of other part of right foot with fat layer exposed: Secondary | ICD-10-CM | POA: Diagnosis present

## 2023-10-22 DIAGNOSIS — G9009 Other idiopathic peripheral autonomic neuropathy: Secondary | ICD-10-CM | POA: Insufficient documentation

## 2023-10-22 NOTE — Progress Notes (Signed)
 Eduardo [name], Eduardo [name] ([id number]) [id number] Page 1 of 10 Visit Report for 10/22/2023 Arrival Information Details Patient Name: Date of Service: Eduardo Eduardo [name] Eduardo Eduardo [name] Eduardo Eduardo [name] Eduardo [name] 10/22/2023 11:00 A M Medical Record Number: [id number] Patient Account Number: [id number] Date of Birth/Sex: Treating RN: [birth year] (53 y.o. Eduardo Eduardo [name] Primary Care Eduardo Eduardo [name]: Eduardo Eduardo [name] Search Other Clinician: Referring Eduardo Eduardo [name]: Treating Eduardo Eduardo [name]/Extender: Eduardo Eduardo [name] Search Eduardo [name] in Treatment: 13 Visit Information History Since Last Visit Added or deleted any medications: No Patient Arrived: Ambulatory Any new allergies or adverse reactions: No Arrival Time: 10:44 Had a fall or experienced change in No Accompanied By: self activities of daily living that may affect Transfer Assistance: None risk of falls: Patient Identification Verified: Yes Signs or symptoms of abuse/neglect since last visito No Patient Requires Transmission-Based Precautions: No Hospitalized since last visit: No Patient Has Alerts: Yes Implantable device outside of the clinic excluding No Patient Alerts: ABIs: R:1.11 L:1.18 9/24 cellular tissue based products placed in the center TBIs: R:1.13 L:1.02 9/24 since last visit: Pain Present Now: No Electronic Signature(s) Signed: 10/22/2023 11:49:32 AM By: Eduardo Pollen RN Entered By: Eduardo Eduardo [name] on 10/22/2023 07:45:21 -------------------------------------------------------------------------------- Encounter Discharge Information Details Patient Name: Date of Service: Eduardo Eduardo [name] Eduardo Eduardo [name] Eduardo Eduardo [name] W. 10/22/2023 11:00 A M Medical Record Number: [id number] Patient Account Number: [id number] Date of Birth/Sex: Treating RN: 09/09/1971 (53 y.o. Eduardo Eduardo [name] Primary Care Kimmerly Lora: Eduardo Eduardo [name] Search Other Clinician: Referring Eduardo Eduardo [name]: Treating Eduardo Eduardo [name]/Extender: Eduardo Eduardo [name] Search Eduardo [name] in Treatment: 13 Encounter Discharge Information Items  Post Procedure Vitals Discharge Condition: Stable Temperature (F): 98.9 Ambulatory Status: Ambulatory Pulse (bpm): 82 Discharge Destination: Home Respiratory Rate (breaths/min): 20 Transportation: Private Auto Blood Pressure (mmHg): 146/87 Accompanied By: self Schedule Follow-up Appointment: Yes Clinical Summary of Care: Patient Declined Electronic Signature(s) Signed: 10/22/2023 11:49:32 AM By: Eduardo Pollen RN Entered By: Eduardo Eduardo [name] on 10/22/2023 08:48:02 Eduardo Eduardo Eduardo [name] ([id number]) [id number] Page 2 of 10 -------------------------------------------------------------------------------- Lower Extremity Assessment Details Patient Name: Date of Service: Eduardo Eduardo [name] Eduardo Eduardo [name] Eduardo Eduardo [name] Eduardo [name] 10/22/2023 11:00 A M Medical Record Number: [id number] Patient Account Number: [id number] Date of Birth/Sex: Treating RN: Jan 06, 1971 (53 y.o. Eduardo Eduardo [name] Primary Care Eduardo Eduardo [name]: Eduardo Eduardo [name] Search Other Clinician: Referring Eduardo Eduardo [name]: Treating Eduardo Eduardo [name]/Extender: Eduardo Eduardo [name] Search Weeks in Treatment: 13 Edema Assessment Assessed: Colletta: No] Glenis: No] Edema: [Left: Yes] [Right: Yes] Calf Left: Right: Point of Measurement: From Medial Instep 46 cm 49 cm Ankle Left: Right: Point of Measurement: From Medial Instep 27 cm 28 cm Vascular Assessment Pulses: Dorsalis Pedis Palpable: [Left:Yes] [Right:Yes] Extremity colors, hair growth, and conditions: Extremity Color: [Left:Hyperpigmented] [Right:Hyperpigmented] Hair Growth on Extremity: [Left:Yes] [Right:Yes] Temperature of Extremity: [Left:Warm] [Right:Warm] Capillary Refill: [Left:< 3 seconds] [Right:< 3 seconds] Dependent Rubor: [Left:No No] [Right:No No] Electronic Signature(s) Signed: 10/22/2023 11:49:32 AM By: Eduardo Pollen RN Entered By: Eduardo Eduardo [name] on 10/22/2023 07:48:56 -------------------------------------------------------------------------------- Multi Wound Chart Details Patient  Name: Date of Service: Eduardo Eduardo [name] Eduardo Eduardo [name] Eduardo Eduardo [name] W. 10/22/2023 11:00 A M Medical Record Number: [id number] Patient Account Number: [id number] Date of Birth/Sex: Treating RN: 07/21/1971 (53 y.o. M) Primary Care Jennye Runquist: Eduardo Eduardo [name] Search Other Clinician: Referring Eduardo Eduardo [name]: Treating Eduardo Eduardo [name]/Extender: Eduardo Eduardo [name] Search Weeks in Treatment: 13 Vital Signs Height(in): 77 Pulse(bpm): 82 Weight(lbs): 330 Blood Pressure(mmHg): 146/87 Body Mass Index(BMI): 39.1 Temperature(F): 98.9 Respiratory Rate(breaths/min): 20 [1:Photos:] [5:No Photos [id number].pdf Page 3 of 10] Right Metatarsal head second Left Metatarsal head first Right, Plantar T Second oe Wound Location: Thermal Burn Gradually Appeared Gradually Appeared Wounding Event: Neuropathic Ulcer-Non Diabetic Neuropathic Ulcer-Non Diabetic  Neuropathic Ulcer-Non Diabetic Primary Etiology: Lymphedema, Chronic Obstructive Lymphedema, Chronic Obstructive Lymphedema, Chronic Obstructive Comorbid History: Pulmonary Disease (COPD), Sleep Pulmonary Disease (COPD), Sleep Pulmonary Disease (COPD), Sleep Apnea, Deep Vein Thrombosis, Apnea, Deep Vein Thrombosis, Apnea, Deep Vein Thrombosis, Peripheral Arterial Disease, Peripheral Arterial Disease, Peripheral Arterial Disease, Osteoarthritis, Neuropathy Osteoarthritis, Neuropathy Osteoarthritis, Neuropathy 04/19/2023 05/20/2023 10/22/2023 Date Acquired: 13 13 0 Weeks of Treatment: Open Open Open Wound Status: No No No Wound Recurrence: 0.1x0.1x0.1 0.1x0.1x0.1 0.3x0.5x0.1 Measurements L x W x D (cm) 0.008 0.008 0.118 A (cm) : rea 0.001 0.001 0.012 Volume (cm) : 99.60% 98.20% [name] % Reduction in A rea: 99.80% 98.90% [name] % Reduction in Volume: Full Thickness Without Exposed Full Thickness Without Exposed Full Thickness Without Exposed Classification: Support Structures Support Structures Support Structures Medium Medium Small Exudate A mount: Serosanguineous  Serosanguineous Serosanguineous Exudate Type: red, brown red, brown red, brown Exudate Color: Distinct, outline attached Distinct, outline attached Flat and Intact Wound Margin: None Present (0%) None Present (0%) Large (67-100%) Granulation A mount: [name] [name] Red Granulation Quality: Large (67-100%) Large (67-100%) Small (1-33%) Necrotic A mount: Eschar Eschar, Adherent Slough Adherent Slough Necrotic Tissue: Fat Layer (Subcutaneous Tissue): Yes Fat Layer (Subcutaneous Tissue): Yes Fat Layer (Subcutaneous Tissue): Yes Exposed Structures: Fascia: No Fascia: No Fascia: No Tendon: No Tendon: No Tendon: No Muscle: No Muscle: No Muscle: No Joint: No Joint: No Joint: No Bone: No Bone: No Bone: No Small (1-33%) Small (1-33%) Medium (34-66%) Epithelialization: Debridement - Excisional Debridement - Excisional Debridement - Selective/Open Wound Debridement: Pre-procedure Verification/Time Out 11:15 11:15 11:15 Taken: Lidocaine  4% Topical Solution Lidocaine  4% Topical Solution Lidocaine  4% Topical Solution Pain Control: Necrotic/Eschar, Callus, Necrotic/Eschar, Callus, Necrotic/Eschar, Callus, Slough Tissue Debrided: Subcutaneous, Slough Subcutaneous, Slough Skin/Subcutaneous Tissue Skin/Subcutaneous Tissue Skin/Epidermis Level: 0.14 0.06 0.12 Debridement A (sq cm): rea Curette Curette Curette Instrument: Minimum Minimum Minimum Bleeding: Pressure Pressure Pressure Hemostasis Achieved: 0 0 0 Procedural Pain: 0 0 0 Post Procedural Pain: Debridement Treatment Response: Procedure was tolerated well Procedure was tolerated well Procedure was tolerated well Post Debridement Measurements L x 0.3x0.3x0.1 0.2x0.2x0.1 0.3x0.5x0.1 W x D (cm) 0.007 0.003 0.012 Post Debridement Volume: (cm) Callus: Yes Callus: Yes Callus: Yes Periwound Skin Texture: No Abnormalities Noted No Abnormalities Noted No Abnormalities Noted Periwound Skin Moisture: No Abnormalities Noted No  Abnormalities Noted No Abnormalities Noted Periwound Skin Color: No Abnormality No Abnormality [name] Temperature: Debridement Debridement Debridement Procedures Performed: Treatment Notes Electronic Signature(s) Signed: 10/22/2023 11:25:05 AM By: Eduardo Nest MD FACS Entered By: Eduardo Nest on 10/22/2023 08:25:04 -------------------------------------------------------------------------------- Multi-Disciplinary Care Plan Details Patient Name: Date of Service: Eduardo Eduardo [name] Eduardo Eduardo [name] Eduardo Eduardo [name] W. 10/22/2023 11:00 A CHRISTELLA Eduardo Eduardo [name] ([id number]) [id number] Page 4 of 10 Medical Record Number: [id number] Patient Account Number: [id number] Date of Birth/Sex: Treating RN: Sep 10, 1971 (53 y.o. Eduardo Merleen Handing Primary Care Dotty Gonzalo: Eduardo Eduardo [name] Search Other Clinician: Referring Azra Abrell: Treating Lowery Paullin/Extender: Eduardo Nest Eduardo Eduardo [name] Search Eduardo [name] in Treatment: 13 Multidisciplinary Care Plan reviewed with physician Active Inactive Wound/Skin Impairment Nursing Diagnoses: Impaired tissue integrity Knowledge deficit related to ulceration/compromised skin integrity Goals: Patient will demonstrate a reduced rate of smoking or cessation of smoking Date Initiated: 07/22/2023 Target Resolution Date: 11/19/2023 Goal Status: Active Patient/caregiver will verbalize understanding of skin care regimen Date Initiated: 07/22/2023 Target Resolution Date: 11/19/2023 Goal Status: Active Interventions: Assess patient/caregiver ability to obtain necessary supplies Assess patient/caregiver ability to perform ulcer/skin care regimen upon admission and as needed Assess ulceration(s) every visit Provide education on smoking Treatment Activities: Referred to DME Amybeth Sieg for  dressing supplies : 07/22/2023 Skin care regimen initiated : 07/22/2023 Topical wound management initiated : 07/22/2023 Notes: Electronic Signature(s) Signed: 10/22/2023 12:47:24 PM By: Merleen Handing RN,  BSN Entered By: Boehlein, Linda on 10/22/2023 08:16:30 -------------------------------------------------------------------------------- Pain Assessment Details Patient Name: Date of Service: Eduardo Eduardo [name] Eduardo Eduardo [name] Eduardo Eduardo [name] W. 10/22/2023 11:00 A M Medical Record Number: [id number] Patient Account Number: [id number] Date of Birth/Sex: Treating RN: [birth year] (53 y.o. Eduardo Eduardo [name] Primary Care Shafiq Larch: Eduardo Eduardo [name] Search Other Clinician: Referring Leaann Nevils: Treating Emre Stock/Extender: Eduardo Eduardo [name] Search Weeks in Treatment: 13 Active Problems Location of Pain Severity and Description of Pain Patient Has Paino No Site Locations [facility], [facility] ([id number]) [id number] Page 5 of 10 Pain Management and Medication Current Pain Management: Electronic Signature(s) Signed: 10/22/2023 11:49:32 AM By: Eduardo Pollen RN Entered By: Eduardo Eduardo [name] on 10/22/2023 07:45:55 -------------------------------------------------------------------------------- Patient/Caregiver Education Details Patient Name: Date of Service: Eduardo Eduardo [name] Eduardo Eduardo [name] Eduardo Eduardo [name] Eduardo Eduardo [name] 1/3/2025andnbsp11:00 A M Medical Record Number: [id number] Patient Account Number: [id number] Date of Birth/Gender: Treating RN: 1971/05/22 (53 y.o. Eduardo Merleen Handing Primary Care Physician: Eduardo Eduardo [name] Search Other Clinician: Referring Physician: Treating Physician/Extender: Eduardo Eduardo [name] Search Eduardo [name] in Treatment: 13 Education Assessment Education Provided To: Patient Education Topics Provided Offloading: Methods: Explain/Verbal Responses: Reinforcements needed, State content correctly Smoking and Wound Healing: Methods: Explain/Verbal Responses: Reinforcements needed, State content correctly Wound/Skin Impairment: Methods: Explain/Verbal Responses: Reinforcements needed, State content correctly Electronic Signature(s) Signed: 10/22/2023 12:47:24 PM By: Merleen Handing RN, BSN Entered By: Boehlein, Linda  on 10/22/2023 08:17:03 Eduardo Eduardo Eduardo [name] ([id number]) [id number] Page 6 of 10 -------------------------------------------------------------------------------- Wound Assessment Details Patient Name: Date of Service: Eduardo Eduardo [name] Eduardo Eduardo [name] Eduardo Eduardo [name] Eduardo [name] 10/22/2023 11:00 A M Medical Record Number: [id number] Patient Account Number: [id number] Date of Birth/Sex: Treating RN: 26-May-1971 (53 y.o. Eduardo Eduardo [name] Primary Care Dominie Benedick: Eduardo Eduardo [name] Search Other Clinician: Referring Dalayah Deahl: Treating Zelina Jimerson/Extender: Eduardo Eduardo [name] Search Weeks in Treatment: 13 Wound Status Wound Number: 1 Primary Neuropathic Ulcer-Non Diabetic Etiology: Wound Location: Right Metatarsal head second Wound Open Wounding Event: Thermal Burn Status: Date Acquired: 04/19/2023 Comorbid Lymphedema, Chronic Obstructive Pulmonary Disease (COPD), Weeks Of Treatment: 13 History: Sleep Apnea, Deep Vein Thrombosis, Peripheral Arterial Disease, Clustered Wound: No Osteoarthritis, Neuropathy Photos Wound Measurements Length: (cm) 0.1 Width: (cm) 0.1 Depth: (cm) 0.1 Area: (cm) 0.008 Volume: (cm) 0.001 % Reduction in Area: 99.6% % Reduction in Volume: 99.8% Epithelialization: Small (1-33%) Tunneling: No Undermining: No Wound Description Classification: Full Thickness Without Exposed Support Wound Margin: Distinct, outline attached Exudate Amount: Medium Exudate Type: Serosanguineous Exudate Color: red, brown Structures Foul Odor After Cleansing: No Slough/Fibrino No Wound Bed Granulation Amount: None Present (0%) Exposed Structure Necrotic Amount: Large (67-100%) Fascia Exposed: No Necrotic Quality: Eschar Fat Layer (Subcutaneous Tissue) Exposed: Yes Tendon Exposed: No Muscle Exposed: No Joint Exposed: No Bone Exposed: No Periwound Skin Texture Texture Color No Abnormalities Noted: No No Abnormalities Noted: Yes Callus: Yes Temperature / Pain Temperature: No  Abnormality Moisture No Abnormalities Noted: Yes Treatment Notes Wound #1 (Metatarsal head second) Wound Laterality: Right Cleanser Normal Saline HELIO, Eduardo Eduardo [name] ([id number]) [id number] Page 7 of 10 Discharge Instruction: Cleanse the wound with Normal Saline prior to applying a clean dressing using gauze sponges, not tissue or cotton balls. Soap and Water Discharge Instruction: May shower and wash wound with dial antibacterial soap and water prior to dressing change. Wound Cleanser Discharge Instruction: Cleanse the wound with wound cleanser prior to applying a clean dressing using gauze sponges, not tissue or cotton balls. Peri-Wound Care Topical Silvadene  Cream  Discharge Instruction: Apply thin layer to wound bed only Primary Dressing Maxorb Extra Ag+ Alginate Dressing, 2x2 (in/in) Discharge Instruction: Apply to wound bed as instructed Secondary Dressing Woven Gauze Sponge, Non-Sterile 4x4 in Discharge Instruction: Apply over primary dressing as directed. Secured With Conforming Stretch Gauze Bandage, Sterile 2x75 (in/in) Discharge Instruction: Secure with stretch gauze as directed. Compression Wrap Compression Stockings Add-Ons Electronic Signature(s) Signed: 10/22/2023 11:49:32 AM By: Eduardo Pollen RN Entered By: Eduardo Eduardo [name] on 10/22/2023 07:56:14 -------------------------------------------------------------------------------- Wound Assessment Details Patient Name: Date of Service: Eduardo Eduardo [name] Eduardo Eduardo [name] Eduardo Eduardo [name] W. 10/22/2023 11:00 A M Medical Record Number: [id number] Patient Account Number: [id number] Date of Birth/Sex: Treating RN: 1970/12/22 (53 y.o. Eduardo Eduardo [name] Primary Care Jossalyn Forgione: Eduardo Eduardo [name] Search Other Clinician: Referring Roxsana Riding: Treating Taline Nass/Extender: Eduardo Eduardo [name] Search Weeks in Treatment: 13 Wound Status Wound Number: 2 Primary Neuropathic Ulcer-Non Diabetic Etiology: Wound Location: Left Metatarsal head  first Wound Open Wounding Event: Gradually Appeared Status: Date Acquired: 05/20/2023 Comorbid Lymphedema, Chronic Obstructive Pulmonary Disease (COPD), Weeks Of Treatment: 13 History: Sleep Apnea, Deep Vein Thrombosis, Peripheral Arterial Disease, Clustered Wound: No Osteoarthritis, Neuropathy Photos Wound Measurements Eduardo, Eduardo [name] ([id number]) Length: (cm) 0.1 Width: (cm) 0.1 Depth: (cm) 0.1 Area: (cm) 0.008 Volume: (cm) 0.001 [id number] Page 8 of 10 % Reduction in Area: 98.2% % Reduction in Volume: 98.9% Epithelialization: Small (1-33%) Tunneling: No Undermining: No Wound Description Classification: Full Thickness Without Exposed Support Structures Wound Margin: Distinct, outline attached Exudate Amount: Medium Exudate Type: Serosanguineous Exudate Color: red, brown Foul Odor After Cleansing: No Slough/Fibrino No Wound Bed Granulation Amount: None Present (0%) Exposed Structure Necrotic Amount: Large (67-100%) Fascia Exposed: No Necrotic Quality: Eschar, Adherent Slough Fat Layer (Subcutaneous Tissue) Exposed: Yes Tendon Exposed: No Muscle Exposed: No Joint Exposed: No Bone Exposed: No Periwound Skin Texture Texture Color No Abnormalities Noted: No No Abnormalities Noted: Yes Callus: Yes Temperature / Pain Temperature: No Abnormality Moisture No Abnormalities Noted: Yes Treatment Notes Wound #2 (Metatarsal head first) Wound Laterality: Left Cleanser Normal Saline Discharge Instruction: Cleanse the wound with Normal Saline prior to applying a clean dressing using gauze sponges, not tissue or cotton balls. Soap and Water Discharge Instruction: May shower and wash wound with dial antibacterial soap and water prior to dressing change. Wound Cleanser Discharge Instruction: Cleanse the wound with wound cleanser prior to applying a clean dressing using gauze sponges, not tissue or cotton balls. Peri-Wound Care Topical Silvadene   Cream Discharge Instruction: Apply thin layer to wound bed only Primary Dressing Maxorb Extra Ag+ Alginate Dressing, 2x2 (in/in) Discharge Instruction: Apply to wound bed as instructed Secondary Dressing Woven Gauze Sponge, Non-Sterile 4x4 in Discharge Instruction: Apply over primary dressing as directed. Secured With Conforming Stretch Gauze Bandage, Sterile 2x75 (in/in) Discharge Instruction: Secure with stretch gauze as directed. Compression Wrap Compression Stockings Add-Ons Electronic Signature(s) Signed: 10/22/2023 11:49:32 AM By: Eduardo Pollen RN Entered By: Eduardo Eduardo [name] on 10/22/2023 07:55:44 Eduardo Eduardo Eduardo [name] ([id number]) [id number] Page 9 of 10 -------------------------------------------------------------------------------- Wound Assessment Details Patient Name: Date of Service: Eduardo Eduardo [name] Eduardo Eduardo [name] Eduardo Eduardo [name] Eduardo [name] 10/22/2023 11:00 A M Medical Record Number: [id number] Patient Account Number: [id number] Date of Birth/Sex: Treating RN: 05/01/1971 (53 y.o. Eduardo Merleen Handing Primary Care Rakiya Krawczyk: Eduardo Eduardo [name] Search Other Clinician: Referring Khadeem Rockett: Treating Ranata Laughery/Extender: Eduardo Eduardo [name] Search Weeks in Treatment: 13 Wound Status Wound Number: 5 Primary Neuropathic Ulcer-Non Diabetic Etiology: Wound Location: Right, Plantar T Second oe Wound Open Wounding Event: Gradually Appeared Status: Date Acquired: 10/22/2023 Comorbid Lymphedema, Chronic Obstructive Pulmonary Disease (COPD), Weeks Of  Treatment: 0 History: Sleep Apnea, Deep Vein Thrombosis, Peripheral Arterial Disease, Clustered Wound: No Osteoarthritis, Neuropathy Wound Measurements Length: (cm) 0.3 Width: (cm) 0.5 Depth: (cm) 0.1 Area: (cm) 0.118 Volume: (cm) 0.012 % Reduction in Area: % Reduction in Volume: Epithelialization: Medium (34-66%) Tunneling: No Undermining: No Wound Description Classification: Full Thickness Without Exposed Support Structures Wound Margin:  Flat and Intact Exudate Amount: Small Exudate Type: Serosanguineous Exudate Color: red, brown Foul Odor After Cleansing: No Slough/Fibrino Yes Wound Bed Granulation Amount: Large (67-100%) Exposed Structure Granulation Quality: Red Fascia Exposed: No Necrotic Amount: Small (1-33%) Fat Layer (Subcutaneous Tissue) Exposed: Yes Necrotic Quality: Adherent Slough Tendon Exposed: No Muscle Exposed: No Joint Exposed: No Bone Exposed: No Periwound Skin Texture Texture Color No Abnormalities Noted: No No Abnormalities Noted: Yes Callus: Yes Moisture No Abnormalities Noted: Yes Treatment Notes Wound #5 (Toe Second) Wound Laterality: Plantar, Right Cleanser Normal Saline Discharge Instruction: Cleanse the wound with Normal Saline prior to applying a clean dressing using gauze sponges, not tissue or cotton balls. Soap and Water Discharge Instruction: May shower and wash wound with dial antibacterial soap and water prior to dressing change. Wound Cleanser Discharge Instruction: Cleanse the wound with wound cleanser prior to applying a clean dressing using gauze sponges, not tissue or cotton balls. Peri-Wound Care Topical Silvadene  Cream Discharge Instruction: Apply thin layer to wound bed only Primary Dressing Maxorb Extra Ag+ Alginate Dressing, 2x2 (in/in) Discharge Instruction: Apply to wound bed as instructed Eduardo, Eduardo [name] ([id number]) [id number] Page 10 of 10 Secondary Dressing Woven Gauze Sponge, Non-Sterile 4x4 in Discharge Instruction: Apply over primary dressing as directed. Secured With Conforming Stretch Gauze Bandage, Sterile 2x75 (in/in) Discharge Instruction: Secure with stretch gauze as directed. Compression Wrap Compression Stockings Add-Ons Electronic Signature(s) Signed: 10/22/2023 12:47:24 PM By: Boehlein, Linda RN, BSN Entered By: Boehlein, Linda on 10/22/2023  08:20:25 -------------------------------------------------------------------------------- Vitals Details Patient Name: Date of Service: Eduardo Eduardo [name] Eduardo Eduardo [name] Eduardo Eduardo [name] W. 10/22/2023 11:00 A M Medical Record Number: [id number] Patient Account Number: [id number] Date of Birth/Sex: Treating RN: 1971/02/13 (53 y.o. Eduardo Eduardo [name] Primary Care Eusevio Schriver: Eduardo Eduardo [name] Search Other Clinician: Referring Eliazar Olivar: Treating Graycen Sadlon/Extender: Eduardo Eduardo [name] Search Weeks in Treatment: 13 Vital Signs Time Taken: 10:45 Temperature (F): 98.9 Height (in): 77 Pulse (bpm): 82 Weight (lbs): 330 Respiratory Rate (breaths/min): 20 Body Mass Index (BMI): 39.1 Blood Pressure (mmHg): 146/87 Reference Range: 80 - 120 mg / dl Electronic Signature(s) Signed: 10/22/2023 11:49:32 AM By: Eduardo Pollen RN Entered By: Eduardo Eduardo [name] on 10/22/2023 08:01:35

## 2023-10-22 NOTE — Progress Notes (Signed)
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 noted for moisture. The periwound skin appearance had no abnormalities noted for color. The periwound skin appearance exhibited: Callus. Periwound temperature was noted as No Abnormality. Wound #2 status is Open. Original cause of wound was Gradually Appeared. The date acquired was: 05/20/2023. The wound has been in treatment 13 weeks. The wound is located on the Left Metatarsal head first. The wound measures 0.1cm length x 0.1cm width x 0.1cm depth; 0.008cm^2 area and 0.001cm^3 volume. There is Fat Layer (Subcutaneous Tissue) exposed. There is no tunneling or undermining noted. There is a medium amount of serosanguineous drainage noted. The wound margin is distinct with the outline attached to the wound  base. There is no granulation within the wound bed. There is a large (67-100%) amount of necrotic tissue within the wound bed including Eschar and Adherent Slough. The periwound skin appearance had no abnormalities noted for moisture. The periwound skin  appearance had no abnormalities noted for color. The periwound skin appearance exhibited: Callus. Periwound temperature was noted as No Abnormality. Wound #5 status is Open. Original cause of wound was Gradually Appeared. The date acquired was: 10/22/2023. The wound is located on the Right,Plantar T oe Second. The wound measures 0.3cm length x 0.5cm width x 0.1cm depth; 0.118cm^2 area and 0.012cm^3 volume. There is Fat Layer (Subcutaneous Tissue) exposed. There is no tunneling or undermining noted. There is a small amount of serosanguineous drainage noted. The wound margin is flat and intact. There is large (67-100%) red granulation within the wound bed. There is a small (1-33%) amount of necrotic tissue within the wound bed including Adherent Slough. The periwound skin appearance had no abnormalities noted for moisture. The periwound skin appearance had no abnormalities noted for color. The periwound skin appearance exhibited: Callus. 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 There was a Excisional Skin/Subcutaneous Tissue Debridement with a total area of 0.14 sq cm performed by Marolyn Nest, MD. With the following instrument(s): Curette to remove Viable and Non-Viable tissue/material. Material removed includes Eschar, Callus, Subcutaneous Tissue, and Slough after achieving pain control using Lidocaine  4% T opical Solution. No specimens were taken. A time out was conducted at 11:15, prior to the start of the procedure. A  Minimum amount of bleeding was controlled with Pressure. The procedure was tolerated well with a pain level of 0 throughout and a pain level of 0 following the procedure. Post Debridement Measurements: 0.3cm length x 0.3cm width x 0.1cm depth; 0.007cm^3 volume. Character of Wound/Ulcer Post Debridement is improved. 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 There was a Excisional Skin/Subcutaneous Eduardo [name], Eduardo [name] ([id number]) [id number] Page 9 of 11 Tissue Debridement with a total area of 0.06 sq cm performed by Marolyn Nest, MD. With the following instrument(s): Curette to remove Viable and Non- Viable tissue/material. Material removed includes Eschar, Callus, Subcutaneous Tissue, and Slough after achieving pain control using Lidocaine  4% T opical Solution. No specimens were taken. A time out was conducted at 11:15, prior to the start of the procedure. A Minimum amount of bleeding was controlled with Pressure. The procedure was tolerated well with a pain level of 0 throughout and a pain level of 0 following the procedure. Post Debridement Measurements: 0.2cm length x 0.2cm width x 0.1cm depth; 0.003cm^3 volume. Character of Wound/Ulcer Post Debridement is improved. Post procedure Diagnosis Wound #2: Same as Pre-Procedure Wound #5 Pre-procedure diagnosis of Wound #5 is a Neuropathic Ulcer-Non Diabetic located on the Right,Plantar T Second . There was a Selective/Open Wound oe Skin/Epidermis Debridement with a total area of 0.12 sq cm performed by Marolyn Nest, MD. With the following instrument(s): Curette to remove Non- Viable tissue/material. Material removed  includes Eschar, Callus, Slough, and Skin: Epidermis after achieving pain control using Lidocaine  4% Topical Solution. No specimens were taken. A time out was  conducted at 11:15, prior to the start of the procedure. A Minimum amount of bleeding was controlled with Pressure. The procedure was tolerated well with a pain level of 0 throughout and a pain level of 0 following the procedure. Post Debridement Measurements: 0.3cm length x 0.5cm width x 0.1cm depth; 0.012cm^3 volume. Character of Wound/Ulcer Post Debridement is improved. Post procedure Diagnosis Wound #5: Same as Pre-Procedure Plan Follow-up Appointments: Return Appointment in 1 week. - Dr. Marolyn 1/10 @ 09:45 am Anesthetic: (In clinic) Topical Lidocaine  4% applied to wound bed Bathing/ Shower/ Hygiene: May shower and wash wound with soap and water. - with dressing changes Off-Loading: Open toe surgical shoe to: - to both feet with peg assist inserts Additional Orders / Instructions: Stop/Decrease Smoking Non Wound Condition: Apply the following to affected area as directed: - 40% urea cream to callouses daily (not in wound beds) WOUND #1: - Metatarsal head second Wound Laterality: Right Cleanser: Normal Saline 1 x Per Day/30 Days Discharge Instructions: Cleanse the wound with Normal Saline prior to applying a clean dressing using gauze sponges, not tissue or cotton balls. Cleanser: Soap and Water 1 x Per Day/30 Days Discharge Instructions: May shower and wash wound with dial antibacterial soap and water prior to dressing change. Cleanser: Wound Cleanser 1 x Per Day/30 Days Discharge Instructions: Cleanse the wound with wound cleanser prior to applying a clean dressing using gauze sponges, not tissue or cotton balls. Topical: Silvadene  Cream 1 x Per Day/30 Days Discharge Instructions: Apply thin layer to wound bed only Prim Dressing: Maxorb Extra Ag+ Alginate Dressing, 2x2 (in/in) 1 x Per Day/30 Days ary Discharge Instructions: Apply to wound bed as instructed Secondary Dressing: Woven Gauze Sponge, Non-Sterile 4x4 in 1 x Per Day/30 Days Discharge Instructions: Apply over primary  dressing as directed. Secured With: Insurance Underwriter, Sterile 2x75 (in/in) 1 x Per Day/30 Days Discharge Instructions: Secure with stretch gauze as directed. WOUND #2: - Metatarsal head first Wound Laterality: Left Cleanser: Normal Saline 1 x Per Day/30 Days Discharge Instructions: Cleanse the wound with Normal Saline prior to applying a clean dressing using gauze sponges, not tissue or cotton balls. Cleanser: Soap and Water 1 x Per Day/30 Days Discharge Instructions: May shower and wash wound with dial antibacterial soap and water prior to dressing change. Cleanser: Wound Cleanser 1 x Per Day/30 Days Discharge Instructions: Cleanse the wound with wound cleanser prior to applying a clean dressing using gauze sponges, not tissue or cotton balls. Topical: Silvadene  Cream 1 x Per Day/30 Days Discharge Instructions: Apply thin layer to wound bed only Prim Dressing: Maxorb Extra Ag+ Alginate Dressing, 2x2 (in/in) 1 x Per Day/30 Days ary Discharge Instructions: Apply to wound bed as instructed Secondary Dressing: Woven Gauze Sponge, Non-Sterile 4x4 in 1 x Per Day/30 Days Discharge Instructions: Apply over primary dressing as directed. Secured With: Insurance Underwriter, Sterile 2x75 (in/in) 1 x Per Day/30 Days Discharge Instructions: Secure with stretch gauze as directed. WOUND #5: - T Second Wound Laterality: Plantar, Right oe Cleanser: Normal Saline 1 x Per Day/30 Days Discharge Instructions: Cleanse the wound with Normal Saline prior to applying a clean dressing using gauze sponges, not tissue or cotton balls. Cleanser: Soap and Water 1 x Per Day/30 Days Discharge Instructions: May shower and wash wound with dial antibacterial soap and water prior to dressing change. Cleanser:  Wound Cleanser 1 x Per Day/30 Days Discharge Instructions: Cleanse the wound with wound cleanser prior to applying a clean dressing using gauze sponges, not tissue or cotton balls. Topical:  Silvadene  Cream 1 x Per Day/30 Days Discharge Instructions: Apply thin layer to wound bed only Prim Dressing: Maxorb Extra Ag+ Alginate Dressing, 2x2 (in/in) 1 x Per Day/30 Days ary Discharge Instructions: Apply to wound bed as instructed Secondary Dressing: Woven Gauze Sponge, Non-Sterile 4x4 in 1 x Per Day/30 Days Discharge Instructions: Apply over primary dressing as directed. Secured With: Insurance Underwriter, Sterile 2x75 (in/in) 1 x Per Day/30 Days Discharge Instructions: Secure with stretch gauze as directed. 10/22/2023: He has a new wound at the base of his right second toe. It appears to be an area where dry skin has cracked. It does appear to be limited to breakdown of skin. The other 2 wounds are smaller with eschar and callus accumulation bilaterally. KASHUS, KARLEN ([id number]) [id number] Page 10 of 11 I used a curette to debride callus, eschar, slough, and subcutaneous tissue from each of the plantar foot wounds. I debrided eschar and slough from the new wound at the base of his second toe. We will continue Silvadene  and silver  alginate to all sites, including the new wound. Continue offloading footwear. The patient has again been encouraged to quit smoking. Follow-up in 1 week. Electronic Signature(s) Signed: 10/22/2023 11:33:44 AM By: Marolyn Nest MD FACS Entered By: Marolyn Nest on 10/22/2023 11:33:44 -------------------------------------------------------------------------------- SuperBill Details Patient Name: Date of Service: Eduardo [name] Eduardo [name] Eduardo [name] [name] 10/22/2023 Medical Record Number: [id number] Patient Account Number: [id number] Date of Birth/Sex: Treating RN: Sep 04, 1971 (53 y.o. M) Primary Care Provider: Wallace Search Other Clinician: Referring Provider: Treating Provider/Extender: Marolyn Nest Wallace Search Devra in Treatment: 13 Diagnosis Coding ICD-10 Codes Code Description [phone] Non-pressure chronic  ulcer of other part of right foot with fat layer exposed L97.522 Non-pressure chronic ulcer of other part of left foot with fat layer exposed L97.511 Non-pressure chronic ulcer of other part of right foot limited to breakdown of skin G90.09 Other idiopathic peripheral autonomic neuropathy Z72.0 Tobacco use E66.01 Morbid (severe) obesity due to excess calories Facility Procedures : CPT4 Code: [id number] Description: 11042 - DEB SUBQ TISSUE 20 SQ CM/< ICD-10 Diagnosis Description L97.512 Non-pressure chronic ulcer of other part of right foot with fat layer exposed L97.522 Non-pressure chronic ulcer of other part of left foot with fat layer exposed  L97.511 Non-pressure chronic ulcer of other part of right foot limited to breakdown of s Modifier: kin Quantity: 1 : CPT4 Code: [id number] Description: 97597 - DEBRIDE WOUND 1ST 20 SQ CM OR < ICD-10 Diagnosis Description L97.511 Non-pressure chronic ulcer of other part of right foot limited to breakdown of s Modifier: kin Quantity: 1 Physician Procedures : CPT4 Code Description Modifier [id number] 99214 - WC PHYS LEVEL 4 - EST PT ICD-10 Diagnosis Description L97.512 Non-pressure chronic ulcer of other part of right foot with fat layer exposed L97.522 Non-pressure chronic ulcer of other part of left foot  with fat layer exposed L97.511 Non-pressure chronic ulcer of other part of right foot limited to breakdown of skin G90.09 Other idiopathic peripheral autonomic neuropathy Quantity: 1 : [id number] 11042 - WC PHYS SUBQ TISS 20 SQ CM ICD-10 Diagnosis Description L97.512 Non-pressure chronic ulcer of other part of right foot with fat layer exposed L97.522 Non-pressure chronic ulcer of other part of left foot with fat layer exposed L97.511  Non-pressure chronic ulcer of other part of right foot limited  to breakdown of skin Quantity: 1 : [id number] 97597 - WC PHYS DEBR WO ANESTH 20 SQ CM ICD-10 Diagnosis Description L97.511 Non-pressure chronic ulcer of other part of  right foot limited to breakdown of skin Eduardo [name], Eduardo [name] ([id number]) [id number] Pa Quantity: 1 ge 11 of 11 Electronic Signature(s) Signed: 10/22/2023 11:34:33 AM By: Marolyn Nest MD FACS Entered By: Marolyn Nest on 10/22/2023 11:34:32

## 2023-10-29 ENCOUNTER — Encounter (HOSPITAL_BASED_OUTPATIENT_CLINIC_OR_DEPARTMENT_OTHER): Payer: Medicaid Other | Admitting: General Surgery

## 2023-10-29 DIAGNOSIS — L97512 Non-pressure chronic ulcer of other part of right foot with fat layer exposed: Secondary | ICD-10-CM | POA: Diagnosis not present

## 2023-10-29 NOTE — Progress Notes (Signed)
 [name], [name] ([id number]) [id number] Page 1 of 10 Visit Report for 10/29/2023 Arrival Information Details Patient Name: Date of Service: Eduardo RANDELL [name] [name] [name] 10/29/2023 9:45 A M Medical Record Number: [id number] Patient Account Number: [id number] Date of Birth/Sex: Treating RN: 03/03/1971 (53 y.o. M) Primary Care Koichi Platte: Wallace Search Other Clinician: Referring Daviel Allegretto: Treating Verneal Wiers/Extender: Marolyn Delon Wallace Search Devra in Treatment: 14 Visit Information History Since Last Visit Added or deleted any medications: No Patient Arrived: Ambulatory Any new allergies or adverse reactions: No Arrival Time: 09:23 Had a fall or experienced change in No Accompanied By: self activities of daily living that may affect Transfer Assistance: None risk of falls: Patient Identification Verified: Yes Signs or symptoms of abuse/neglect since last visito No Secondary Verification Process Completed: Yes Hospitalized since last visit: No Patient Requires Transmission-Based Precautions: No Implantable device outside of the clinic excluding No Patient Has Alerts: Yes cellular tissue based products placed in the center Patient Alerts: ABIs: R:1.11 L:1.18 9/24 since last visit: TBIs: R:1.13 L:1.02 9/24 Has Dressing in Place as Prescribed: Yes Pain Present Now: No Electronic Signature(s) Signed: 10/29/2023 12:31:59 PM By: Merleen Handing RN, BSN Entered By: Boehlein, Linda on 10/29/2023 10:03:41 -------------------------------------------------------------------------------- Encounter Discharge Information Details Patient Name: Date of Service: Eduardo RANDELL [name] [name] W. 10/29/2023 9:45 A M Medical Record Number: [id number] Patient Account Number: [id number] Date of Birth/Sex: Treating RN: 07-10-1971 (53 y.o. Eduardo [name] Merleen Handing Primary Care Khilynn Borntreger: Wallace Search Other Clinician: Referring Lulamae Skorupski: Treating Storey Stangeland/Extender: Marolyn Delon [place] Search Devra in Treatment: 14 Encounter Discharge Information Items Post Procedure Vitals Discharge Condition: Stable Temperature (F): 97.9 Ambulatory Status: Ambulatory Pulse (bpm): 77 Discharge Destination: Home Respiratory Rate (breaths/min): 18 Transportation: Private Auto Blood Pressure (mmHg): 181/89 Accompanied By: self Schedule Follow-up Appointment: Yes Clinical Summary of Care: Patient Declined Electronic Signature(s) Signed: 10/29/2023 12:31:59 PM By: Merleen Handing RN, BSN Entered By: Merleen Handing on 10/29/2023 10:24:46 Eduardo [name] ([id number]) [id number] Page 2 of 10 -------------------------------------------------------------------------------- Lower Extremity Assessment Details Patient Name: Date of Service: Eduardo RANDELL [name] [name] [name] 10/29/2023 9:45 A M Medical Record Number: [id number] Patient Account Number: [id number] Date of Birth/Sex: Treating RN: 02/26/1971 (53 y.o. Eduardo [name] Merleen Handing Primary Care Valton Schwartz: Wallace Search Other Clinician: Referring Treniyah Lynn: Treating Jedidiah Demartini/Extender: Marolyn Delon Wallace Search Weeks in Treatment: 14 Edema Assessment Assessed: Colletta: No] Glenis: No] Edema: [Left: Yes] [Right: Yes] Calf Left: Right: Point of Measurement: From Medial Instep 46 cm 49 cm Ankle Left: Right: Point of Measurement: From Medial Instep 27 cm 28 cm Vascular Assessment Pulses: Dorsalis Pedis Palpable: [Left:Yes] [Right:Yes] Extremity colors, hair growth, and conditions: Extremity Color: [Left:Hyperpigmented] [Right:Hyperpigmented] Hair Growth on Extremity: [Left:Yes] [Right:Yes] Temperature of Extremity: [Left:Warm] [Right:Warm] Capillary Refill: [Left:< 3 seconds] [Right:< 3 seconds] Dependent Rubor: [Left:No No] [Right:No No] Electronic Signature(s) Signed: 10/29/2023 12:31:59 PM By: Merleen Handing RN, BSN Entered By: Merleen Handing on 10/29/2023  10:05:16 -------------------------------------------------------------------------------- Multi Wound Chart Details Patient Name: Date of Service: Eduardo RANDELL [name] [name] W. 10/29/2023 9:45 A M Medical Record Number: [id number] Patient Account Number: [id number] Date of Birth/Sex: Treating RN: 16-Sep-1971 (53 y.o. M) Primary Care Dijuan Sleeth: Wallace Search Other Clinician: Referring Tino Ronan: Treating Mitsugi Schrader/Extender: Marolyn Delon Wallace Search Weeks in Treatment: 14 Vital Signs Height(in): 77 Pulse(bpm): 77 Weight(lbs): 330 Blood Pressure(mmHg): 181/89 Body Mass Index(BMI): 39.1 Temperature(F): 97.9 Respiratory Rate(breaths/min): 20 [1:Photos:] [phone].pdf Page 3 of 10] Right Metatarsal head second Left Metatarsal head first Right, Plantar T Second oe Wound Location: Thermal Burn Gradually  Appeared Gradually Appeared Wounding Event: Neuropathic Ulcer-Non Diabetic Neuropathic Ulcer-Non Diabetic Neuropathic Ulcer-Non Diabetic Primary Etiology: Lymphedema, Chronic Obstructive Lymphedema, Chronic Obstructive Lymphedema, Chronic Obstructive Comorbid History: Pulmonary Disease (COPD), Sleep Pulmonary Disease (COPD), Sleep Pulmonary Disease (COPD), Sleep Apnea, Deep Vein Thrombosis, Apnea, Deep Vein Thrombosis, Apnea, Deep Vein Thrombosis, Peripheral Arterial Disease, Peripheral Arterial Disease, Peripheral Arterial Disease, Osteoarthritis, Neuropathy Osteoarthritis, Neuropathy Osteoarthritis, Neuropathy 04/19/2023 05/20/2023 10/22/2023 Date Acquired: 14 14 1  Weeks of Treatment: Open Open Healed - Epithelialized Wound Status: No No No Wound Recurrence: 0.2x0.2x0.2 0.1x0.1x0.1 0x0x0 Measurements L x W x D (cm) 0.031 0.008 0 A (cm) : rea 0.006 0.001 0 Volume (cm) : 98.60% 98.20% 100.00% % Reduction in A rea: 98.60% 98.90% 100.00% % Reduction in Volume: 12 Starting Position 1 (o'clock): 12 Ending Position 1 (o'clock): 0.2 Maximum Distance 1  (cm): Yes No No Undermining: Full Thickness Without Exposed Full Thickness Without Exposed Full Thickness Without Exposed Classification: Support Structures Support Structures Support Structures Small None Present None Present Exudate A mount: Serosanguineous [name] [name] Exudate Type: red, brown [name] [name] Exudate Color: Distinct, outline attached Distinct, outline attached [name] Wound Margin: None Present (0%) None Present (0%) None Present (0%) Granulation A mount: None Present (0%) None Present (0%) None Present (0%) Necrotic A mount: Fat Layer (Subcutaneous Tissue): Yes Fat Layer (Subcutaneous Tissue): Yes Fascia: No Exposed Structures: Fascia: No Fascia: No Fat Layer (Subcutaneous Tissue): No Tendon: No Tendon: No Tendon: No Muscle: No Muscle: No Muscle: No Joint: No Joint: No Joint: No Bone: No Bone: No Bone: No Large (67-100%) Large (67-100%) Large (67-100%) Epithelialization: Debridement - Excisional Debridement - Excisional [name] Debridement: Pre-procedure Verification/Time Out 10:10 10:10 [name] Taken: Callus, Subcutaneous, Slough Callus, Subcutaneous, Slough [name] Tissue Debrided: Skin/Subcutaneous Tissue Skin/Subcutaneous Tissue [name] Level: 0.08 0.31 [name] Debridement A (sq cm): rea Curette Curette [name] Instrument: Minimum Minimum [name] Bleeding: Pressure Pressure [name] Hemostasis A chieved: 0 0 [name] Procedural Pain: 0 0 [name] Post Procedural Pain: Procedure was tolerated well Procedure was tolerated well [name] Debridement Treatment Response: 0.3x0.3x0.1 0.2x1x0.1 [name] Post Debridement Measurements L x W x D (cm) 0.007 0.016 [name] Post Debridement Volume: (cm) Callus: Yes Callus: Yes Callus: Yes Periwound Skin Texture: No Abnormalities Noted No Abnormalities Noted No Abnormalities Noted Periwound Skin Moisture: No Abnormalities Noted No Abnormalities Noted No Abnormalities Noted Periwound Skin Color: No Abnormality No Abnormality No  Abnormality Temperature: Debridement Debridement [name] Procedures Performed: Treatment Notes Electronic Signature(s) Signed: 10/29/2023 10:20:55 AM By: Marolyn Nest MD FACS Entered By: Marolyn Nest on 10/29/2023 10:20:55 -------------------------------------------------------------------------------- Multi-Disciplinary Care Plan Details Patient Name: Date of Service: Eduardo RANDELL [name] [name] W. 10/29/2023 9:45 A Eduardo [name] ([id number]) [id number] Page 4 of 10 Medical Record Number: [id number] Patient Account Number: [id number] Date of Birth/Sex: Treating RN: 18-Jul-1971 (53 y.o. Eduardo [name] Merleen Handing Primary Care Jazzmon Prindle: Wallace Search Other Clinician: Referring Hellena Pridgen: Treating Oluwadamilola Deliz/Extender: Marolyn Nest Wallace Search Devra in Treatment: 14 Multidisciplinary Care Plan reviewed with physician Active Inactive Wound/Skin Impairment Nursing Diagnoses: Impaired tissue integrity Knowledge deficit related to ulceration/compromised skin integrity Goals: Patient will demonstrate a reduced rate of smoking or cessation of smoking Date Initiated: 07/22/2023 Target Resolution Date: 11/19/2023 Goal Status: Active Patient/caregiver will verbalize understanding of skin care regimen Date Initiated: 07/22/2023 Target Resolution Date: 11/19/2023 Goal Status: Active Interventions: Assess patient/caregiver ability to obtain necessary supplies Assess patient/caregiver ability to perform ulcer/skin care regimen upon admission and as needed Assess ulceration(s) every visit Provide education on smoking Treatment Activities: Referred to DME Nashly Olsson for dressing  supplies : 07/22/2023 Skin care regimen initiated : 07/22/2023 Topical wound management initiated : 07/22/2023 Notes: Electronic Signature(s) Signed: 10/29/2023 12:31:59 PM By: Merleen Handing RN, BSN Entered By: Boehlein, Linda on 10/29/2023  10:11:42 -------------------------------------------------------------------------------- Pain Assessment Details Patient Name: Date of Service: Eduardo RANDELL [name] [name] W. 10/29/2023 9:45 A M Medical Record Number: [id number] Patient Account Number: [id number] Date of Birth/Sex: Treating RN: 19-Jun-1971 (53 y.o. M) Primary Care Keviana Guida: Wallace Search Other Clinician: Referring Orlanda Frankum: Treating Bravlio Luca/Extender: Marolyn Delon Wallace Search Devra in Treatment: 14 Active Problems Location of Pain Severity and Description of Pain Patient Has Paino No Site Locations Rate the pain. Eduardo [name], Eduardo [name] ([id number]) [id number] Page 5 of 10 Rate the pain. Current Pain Level: 0 Pain Management and Medication Current Pain Management: Electronic Signature(s) Signed: 10/29/2023 12:31:59 PM By: Merleen Handing RN, BSN Entered By: Boehlein, Linda on 10/29/2023 10:03:52 -------------------------------------------------------------------------------- Patient/Caregiver Education Details Patient Name: Date of Service: Eduardo RANDELL [name] [name] MICAEL 1/10/2025andnbsp9:45 A M Medical Record Number: [id number] Patient Account Number: [id number] Date of Birth/Gender: Treating RN: January 03, 1971 (53 y.o. Eduardo [name] Merleen Handing Primary Care Physician: Wallace Search Other Clinician: Referring Physician: Treating Physician/Extender: Marolyn Delon Wallace Search Devra in Treatment: 14 Education Assessment Education Provided To: Patient Education Topics Provided Smoking and Wound Healing: Wound/Skin Impairment: Methods: Explain/Verbal Responses: Reinforcements needed, State content correctly Electronic Signature(s) Signed: 10/29/2023 12:31:59 PM By: Merleen Handing RN, BSN Entered By: Merleen Handing on 10/29/2023 10:12:09 -------------------------------------------------------------------------------- Wound Assessment Details Patient Name: Date of Service: Eduardo RANDELL [name] [name] W. 10/29/2023 9:45 A M Medical Record Number: [id number] Patient Account Number: [id number] Date of Birth/Sex: Treating RN: 07-23-1971 (52 y.o. M) Primary Care Keary Hanak: Wallace Search Other Clinician: FORBES ADINE [name] ([id number]) [id number] Page 6 of 10 Referring Aarsh Fristoe: Treating Jenilyn Magana/Extender: Marolyn Delon Wallace Search Weeks in Treatment: 14 Wound Status Wound Number: 1 Primary Neuropathic Ulcer-Non Diabetic Etiology: Wound Location: Right Metatarsal head second Wound Open Wounding Event: Thermal Burn Status: Date Acquired: 04/19/2023 Comorbid Lymphedema, Chronic Obstructive Pulmonary Disease (COPD), Weeks Of Treatment: 14 History: Sleep Apnea, Deep Vein Thrombosis, Peripheral Arterial Disease, Clustered Wound: No Osteoarthritis, Neuropathy Photos Wound Measurements Length: (cm) 0.2 Width: (cm) 0.2 Depth: (cm) 0.2 Area: (cm) 0.031 Volume: (cm) 0.006 % Reduction in Area: 98.6% % Reduction in Volume: 98.6% Epithelialization: Large (67-100%) Tunneling: No Undermining: Yes Starting Position (o'clock): 12 Ending Position (o'clock): 12 Maximum Distance: (cm) 0.2 Wound Description Classification: Full Thickness Without Exposed Support Structures Wound Margin: Distinct, outline attached Exudate Amount: Small Exudate Type: Serosanguineous Exudate Color: red, brown Foul Odor After Cleansing: No Slough/Fibrino No Wound Bed Granulation Amount: None Present (0%) Exposed Structure Necrotic Amount: None Present (0%) Fascia Exposed: No Fat Layer (Subcutaneous Tissue) Exposed: Yes Tendon Exposed: No Muscle Exposed: No Joint Exposed: No Bone Exposed: No Periwound Skin Texture Texture Color No Abnormalities Noted: No No Abnormalities Noted: Yes Callus: Yes Temperature / Pain Temperature: No Abnormality Moisture No Abnormalities Noted: Yes Treatment Notes Wound #1 (Metatarsal head second) Wound Laterality:  Right Cleanser Normal Saline Discharge Instruction: Cleanse the wound with Normal Saline prior to applying a clean dressing using gauze sponges, not tissue or cotton balls. Soap and Water Discharge Instruction: May shower and wash wound with dial antibacterial soap and water prior to dressing change. Wound Cleanser Discharge Instruction: Cleanse the wound with wound cleanser prior to applying a clean dressing using gauze sponges, not tissue or cotton balls. Peri-Wound Care Eduardo [name], Eduardo [name] ([id number]) [phone].pdf Page 7 of 10 Topical Primary Dressing Maxorb  Extra Ag+ Alginate Dressing, 2x2 (in/in) Discharge Instruction: Apply to wound bed as instructed Secondary Dressing Woven Gauze Sponge, Non-Sterile 4x4 in Discharge Instruction: Apply over primary dressing as directed. Secured With Conforming Stretch Gauze Bandage, Sterile 2x75 (in/in) Discharge Instruction: Secure with stretch gauze as directed. Compression Wrap Compression Stockings Add-Ons Notes pt going home to take a shower and will apply dressing at home Electronic Signature(s) Signed: 10/29/2023 12:31:59 PM By: Boehlein, Linda RN, BSN Entered By: Boehlein, Linda on 10/29/2023 10:06:01 -------------------------------------------------------------------------------- Wound Assessment Details Patient Name: Date of Service: Eduardo RANDELL [name] [name] W. 10/29/2023 9:45 A M Medical Record Number: [id number] Patient Account Number: [id number] Date of Birth/Sex: Treating RN: [birth year] (53 y.o. M) Primary Care Rosell Khouri: Wallace Search Other Clinician: Referring Lorenso Quirino: Treating Lakeyta Vandenheuvel/Extender: Marolyn Delon Wallace Search Weeks in Treatment: 14 Wound Status Wound Number: 2 Primary Neuropathic Ulcer-Non Diabetic Etiology: Wound Location: Left Metatarsal head first Wound Open Wounding Event: Gradually Appeared Status: Date Acquired: 05/20/2023 Comorbid Lymphedema, Chronic Obstructive  Pulmonary Disease (COPD), Weeks Of Treatment: 14 History: Sleep Apnea, Deep Vein Thrombosis, Peripheral Arterial Disease, Clustered Wound: No Osteoarthritis, Neuropathy Photos Wound Measurements Length: (cm) 0.1 Width: (cm) 0.1 Depth: (cm) 0.1 Area: (cm) 0.008 Volume: (cm) 0.001 % Reduction in Area: 98.2% % Reduction in Volume: 98.9% Epithelialization: Large (67-100%) Tunneling: No Undermining: No Wound Description Eduardo [name], Eduardo [name] ([id number]) Classification: Full Thickness Without Exposed Support Structures Wound Margin: Distinct, outline attached Exudate Amount: None Present [id number] Page 8 of 10 Foul Odor After Cleansing: No Slough/Fibrino No Wound Bed Granulation Amount: None Present (0%) Exposed Structure Necrotic Amount: None Present (0%) Fascia Exposed: No Fat Layer (Subcutaneous Tissue) Exposed: Yes Tendon Exposed: No Muscle Exposed: No Joint Exposed: No Bone Exposed: No Periwound Skin Texture Texture Color No Abnormalities Noted: No No Abnormalities Noted: Yes Callus: Yes Temperature / Pain Temperature: No Abnormality Moisture No Abnormalities Noted: Yes Treatment Notes Wound #2 (Metatarsal head first) Wound Laterality: Left Cleanser Normal Saline Discharge Instruction: Cleanse the wound with Normal Saline prior to applying a clean dressing using gauze sponges, not tissue or cotton balls. Soap and Water Discharge Instruction: May shower and wash wound with dial antibacterial soap and water prior to dressing change. Wound Cleanser Discharge Instruction: Cleanse the wound with wound cleanser prior to applying a clean dressing using gauze sponges, not tissue or cotton balls. Peri-Wound Care Topical Primary Dressing Secondary Dressing Woven Gauze Sponge, Non-Sterile 4x4 in Discharge Instruction: Apply over primary dressing as directed. Secured With Conforming Stretch Gauze Bandage, Sterile 2x75 (in/in) Discharge  Instruction: Secure with stretch gauze as directed. Compression Wrap Compression Stockings Add-Ons Notes pt going home to take a shower and will apply dressing at home Electronic Signature(s) Signed: 10/29/2023 12:31:59 PM By: Boehlein, Linda RN, BSN Entered By: Boehlein, Linda on 10/29/2023 10:06:25 -------------------------------------------------------------------------------- Wound Assessment Details Patient Name: Date of Service: Eduardo RANDELL [name] [name] W. 10/29/2023 9:45 A M Medical Record Number: [id number] Patient Account Number: [id number] Date of Birth/Sex: Treating RN: October 20, 1970 (53 y.o. Eduardo [name] Merleen Handing Primary Care Neida Ellegood: Wallace Search Other Clinician: Referring Graycen Degan: Treating Ellison Rieth/Extender: Marolyn Delon Wallace Search Devra in Treatment: [address] [place] W ([id number]) [id number] Page 9 of 10 Wound Status Wound Number: 5 Primary Neuropathic Ulcer-Non Diabetic Etiology: Wound Location: Right, Plantar T Second oe Wound Healed - Epithelialized Wounding Event: Gradually Appeared Status: Date Acquired: 10/22/2023 Comorbid Lymphedema, Chronic Obstructive Pulmonary Disease (COPD), Weeks Of Treatment: 1 History: Sleep Apnea, Deep Vein Thrombosis, Peripheral Arterial Disease, Clustered Wound: No Osteoarthritis, Neuropathy Photos Wound  Measurements Length: (cm) Width: (cm) Depth: (cm) Area: (cm) Volume: (cm) 0 % Reduction in Area: 100% 0 % Reduction in Volume: 100% 0 Epithelialization: Large (67-100%) 0 Tunneling: No 0 Undermining: No Wound Description Classification: Full Thickness Without Exposed Support Structures Exudate Amount: None Present Foul Odor After Cleansing: No Slough/Fibrino No Wound Bed Granulation Amount: None Present (0%) Exposed Structure Necrotic Amount: None Present (0%) Fascia Exposed: No Fat Layer (Subcutaneous Tissue) Exposed: No Tendon Exposed: No Muscle Exposed: No Joint Exposed:  No Bone Exposed: No Periwound Skin Texture Texture Color No Abnormalities Noted: No No Abnormalities Noted: Yes Callus: Yes Temperature / Pain Temperature: No Abnormality Moisture No Abnormalities Noted: Yes Electronic Signature(s) Signed: 10/29/2023 12:31:59 PM By: Merleen Handing RN, BSN Entered By: Merleen Handing on 10/29/2023 10:06:59 -------------------------------------------------------------------------------- Vitals Details Patient Name: Date of Service: Eduardo RANDELL [name] [name] W. 10/29/2023 9:45 A M Medical Record Number: [id number] Patient Account Number: [id number] Date of Birth/Sex: Treating RN: [birth year] (53 y.o. M) Primary Care Peityn Payton: Wallace Search Other Clinician: Referring Alwyn Cordner: Treating Vannie Hochstetler/Extender: Marolyn Delon Wallace Search Weeks in Treatment: [address] Eduardo [name] ([id number]) [id number] Page 10 of 10 Time Taken: 09:24 Temperature (F): 97.9 Height (in): 77 Pulse (bpm): 77 Weight (lbs): 330 Respiratory Rate (breaths/min): 20 Body Mass Index (BMI): 39.1 Blood Pressure (mmHg): 181/89 Reference Range: 80 - 120 mg / dl Electronic Signature(s) Signed: 10/29/2023 12:31:59 PM By: Merleen Handing RN, BSN Entered By: Boehlein, Linda on 10/29/2023 10:03:45

## 2023-10-29 NOTE — Progress Notes (Signed)
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 the start of the procedure. A Minimum amount of bleeding was controlled with Pressure. The procedure was DIVANTE, KOTCH ([id number]) [phone].pdf Page 8 of 9 tolerated well with a pain level of 0 throughout and a pain level of 0 following the procedure. Post Debridement Measurements: 0.3cm length x 0.3cm width x 0.1cm depth; 0.007cm^3 volume. Character of Wound/Ulcer Post Debridement is improved.  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 There was a Excisional Skin/Subcutaneous Tissue Debridement with a total area of 0.31 sq cm performed by Marolyn Nest, MD. With the following instrument(s): Curette to remove Viable and Non- Viable tissue/material. Material removed includes Callus, Subcutaneous Tissue, Slough, and Skin: Epidermis. No specimens were taken. A time out  was conducted at 10:10, prior to the start of the procedure. A Minimum amount of bleeding was controlled with Pressure. The procedure was tolerated well with a pain level of 0 throughout and a pain level of 0 following the procedure. Post Debridement Measurements: 0.2cm length x 1cm width x 0.1cm depth; 0.016cm^3 volume. Character of Wound/Ulcer Post Debridement is improved. Post procedure Diagnosis Wound #2: Same as Pre-Procedure Plan Follow-up Appointments: Return Appointment in 1 week. - Dr. Marolyn 1/15 @ 09:15 am Anesthetic: (In clinic) Topical Lidocaine  4% applied to wound bed Bathing/ Shower/ Hygiene: May shower and wash wound with soap and water. - with dressing changes Off-Loading: Open toe surgical shoe to: - to both feet with peg assist inserts Additional Orders / Instructions: Stop/Decrease Smoking Non Wound Condition: Apply the following to affected area as directed: - 40% urea cream to callouses daily (not in wound beds) WOUND #1: - Metatarsal head second Wound Laterality: Right Cleanser: Normal Saline 1 x Per Day/30 Days Discharge Instructions: Cleanse the wound with Normal Saline prior to applying a clean dressing using gauze sponges, not tissue or cotton balls. Cleanser: Soap and Water 1 x Per Day/30 Days Discharge Instructions: May shower and wash wound with dial antibacterial soap and water prior to dressing change. Cleanser: Wound Cleanser 1 x Per Day/30 Days Discharge Instructions: Cleanse the wound with wound cleanser prior to applying a clean dressing using gauze sponges, not tissue or cotton balls. Topical: Silvadene  Cream 1 x Per Day/30 Days Discharge Instructions: Apply thin layer to wound bed only Prim Dressing: Maxorb Extra Ag+ Alginate Dressing, 2x2 (in/in) 1 x Per Day/30 Days ary Discharge Instructions: Apply to wound bed as instructed Secondary Dressing: Woven Gauze Sponge, Non-Sterile 4x4 in 1 x Per Day/30 Days Discharge Instructions: Apply over primary  dressing as directed. Secured With: Insurance Underwriter, Sterile 2x75 (in/in) 1 x Per Day/30 Days Discharge Instructions: Secure with stretch gauze as directed. WOUND #2: - Metatarsal head first Wound Laterality: Left Cleanser: Normal Saline 1 x Per Day/30 Days Discharge Instructions: Cleanse the wound with Normal Saline prior to applying a clean dressing using gauze sponges, not tissue or cotton balls. Cleanser: Soap and Water 1 x Per Day/30 Days Discharge Instructions: May shower and wash wound with dial antibacterial soap and water prior to dressing change. Cleanser: Wound Cleanser 1 x Per Day/30 Days Discharge Instructions: Cleanse the wound with wound cleanser prior to applying a clean dressing using gauze sponges, not tissue or cotton balls. Topical: Silvadene  Cream 1 x Per Day/30 Days Discharge Instructions: Apply thin layer to wound bed only Prim Dressing: Maxorb Extra Ag+ Alginate Dressing, 2x2 (in/in) 1 x Per Day/30 Days ary Discharge Instructions: Apply to wound bed as instructed Secondary Dressing: Woven Gauze Sponge, Non-Sterile 4x4 in 1 x Per Day/30 Days Discharge Instructions: Apply over primary dressing as directed. Secured With: Insurance Underwriter, Sterile 2x75 (in/in) 1 x Per Day/30 Days Discharge Instructions: Secure with stretch gauze as directed. 10/29/2023: The wound at the base of his right second toe has healed.  The other wounds have accumulated callus once again, but are smaller after debridement of the callus to expose their true size. I used a curette to debride callus, slough, and subcutaneous tissue from the ulcers on his feet. He seems to be doing well with the Silvadene  that he requested so we will continue this, along with silver  alginate. He is supposed to be wearing a peg assist inserts, but he comes into clinic each week in [employer] shoes, so I am not sure he is actually using the off loaders. Follow-up in 1 week. Electronic  Signature(s) Signed: 10/29/2023 10:33:18 AM By: Marolyn Nest MD FACS Entered By: Marolyn Nest on 10/29/2023 10:33:17 FORBES ADINE ORN ([id number]) [id number] Page 9 of 9 -------------------------------------------------------------------------------- SuperBill Details Patient Name: Date of Service: KIRSTIE RANDELL [name] [name] MICAEL 10/29/2023 Medical Record Number: [id number] Patient Account Number: [id number] Date of Birth/Sex: Treating RN: 16-Aug-1971 (53 y.o. M) Primary Care Provider: Wallace Search Other Clinician: Referring Provider: Treating Provider/Extender: Marolyn Nest Wallace Search Devra in Treatment: 14 Diagnosis Coding ICD-10 Codes Code Description [phone] Non-pressure chronic ulcer of other part of right foot with fat layer exposed L97.522 Non-pressure chronic ulcer of other part of left foot with fat layer exposed G90.09 Other idiopathic peripheral autonomic neuropathy Z72.0 Tobacco use E66.01 Morbid (severe) obesity due to excess calories Facility Procedures : CPT4 Code: [id number] Description: 11042 - DEB SUBQ TISSUE 20 SQ CM/< ICD-10 Diagnosis Description L97.512 Non-pressure chronic ulcer of other part of right foot with fat layer exposed L97.522 Non-pressure chronic ulcer of other part of left foot with fat layer exposed Modifier: Quantity: 1 Physician Procedures : CPT4 Code Description Modifier [id number] - WC PHYS LEVEL 4 - EST PT ICD-10 Diagnosis Description L97.512 Non-pressure chronic ulcer of other part of right foot with fat layer exposed L97.522 Non-pressure chronic ulcer of other part of left foot  with fat layer exposed G90.09 Other idiopathic peripheral autonomic neuropathy Z72.0 Tobacco use Quantity: 1 : [id number] 11042 - WC PHYS SUBQ TISS 20 SQ CM ICD-10 Diagnosis Description L97.512 Non-pressure chronic ulcer of other part of right foot with fat layer exposed L97.522 Non-pressure chronic ulcer of other part of left  foot with fat layer exposed Quantity: 1 Electronic Signature(s) Signed: 10/29/2023 10:35:34 AM By: Marolyn Nest MD FACS Entered By: Marolyn Nest on 10/29/2023 10:35:34

## 2023-11-03 ENCOUNTER — Encounter (HOSPITAL_BASED_OUTPATIENT_CLINIC_OR_DEPARTMENT_OTHER): Payer: Medicaid Other | Admitting: General Surgery

## 2023-11-03 DIAGNOSIS — L97512 Non-pressure chronic ulcer of other part of right foot with fat layer exposed: Secondary | ICD-10-CM | POA: Diagnosis not present

## 2023-11-04 NOTE — Progress Notes (Addendum)
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 tissue within the wound bed. The periwound skin appearance had no abnormalities noted for moisture. The periwound skin appearance had no abnormalities noted for color. The periwound skin appearance exhibited: Callus. Periwound temperature was noted as No Abnormality. Wound #2 status is Open. Original cause of wound was Gradually Appeared. The date acquired was: 05/20/2023. The wound has been in  treatment 14 weeks. The wound is located on the Left Metatarsal head first. The wound measures 0.1cm length x 0.1cm width x 0.1cm depth; 0.008cm^2 area and 0.001cm^3 volume. There is Fat Layer (Subcutaneous Tissue) exposed. There is no tunneling or undermining noted. There is a none present amount of drainage noted. The wound margin is indistinct and nonvisible. There is no granulation within the wound bed. There is no necrotic tissue within the wound bed. The periwound skin appearance had no abnormalities noted for moisture. The periwound skin appearance had no abnormalities noted for color. The periwound skin appearance exhibited: Callus. Periwound temperature was noted as No Abnormality. Wound #6 status is Open. Original cause of wound was Bump. The date acquired was: 10/25/2023. The wound is located on the Head - occiput. The wound measures 0.5cm length x 0.5cm width x 0.5cm depth; 0.196cm^2 area and 0.098cm^3 volume. There is Fat Layer (Subcutaneous Tissue) exposed. There is no tunneling noted, however, there is undermining starting at 12:00 and ending at 12:00 with a maximum distance of 0.4cm. There is a medium amount of serosanguineous drainage noted. The wound margin is well defined and not attached to the wound base. There is large (67-100%) red granulation within the wound bed. There is a small (1-33%) amount of necrotic tissue within the wound bed including Adherent Slough. The periwound skin appearance had no abnormalities noted for texture. The periwound skin appearance had no abnormalities noted for moisture. The periwound skin appearance had no abnormalities noted for color. Periwound temperature was noted as No Abnormality. The periwound has tenderness on palpation. 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 There was a Excisional Skin/Subcutaneous Tissue Debridement with a total area of 0.14 sq cm performed by Duanne Guess, MD. With the following instrument(s): Curette to remove Viable and Non-Viable tissue/material. Material removed includes Callus, Subcutaneous Tissue, Slough, and Skin: Epidermis after achieving pain control using Lidocaine 4% Topical Solution. No specimens were taken. A time out was conducted at 09:50, prior to the start of the procedure. There was no bleeding. The procedure was tolerated well with a pain level of 0 throughout and a pain level of 0 following the procedure. Post Debridement Measurements: 0.3cm length x 0.3cm width x 0.2cm depth; 0.014cm^3 volume. Character of Wound/Ulcer Post Debridement is improved. 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 There was a Selective/Open Wound Skin/Epidermis Debridement with a total area of 0.02 sq cm performed by Duanne Guess, MD. With the following instrument(s): Curette to remove Non- Viable tissue/material. Material removed includes Callus,  Slough, and Skin: Epidermis after achieving pain control using Lidocaine 4% Topical Solution. No specimens were taken. A time out was conducted at 09:50, prior to the start of the procedure. There was no bleeding. The procedure was tolerated well with a pain level of 0 throughout and a pain level of 0 following the procedure. Post Debridement Measurements: 0.1cm length x 0.1cm  width x 0.1cm depth; 0.001cm^3 volume. Character of Wound/Ulcer Post Debridement is improved. Post procedure Diagnosis Wound #2: Same as Pre-Procedure Wound #6 Pre-procedure diagnosis of Wound #6 is an Abscess located on the Head - occiput . There was a Excisional Skin/Subcutaneous Tissue Debridement with a total area of 0.2 sq cm performed by Duanne Guess, MD. With the following instrument(s): Curette to remove Viable and Non-Viable tissue/material. Material removed includes Subcutaneous Tissue and Slough and after achieving pain control using Lidocaine 4% T opical Solution. No specimens were taken. A time out was conducted at 09:50, prior to the start of the procedure. There was no bleeding. The procedure was tolerated well with a pain level of 0 throughout and a pain level of 0 following the procedure. Post Debridement Measurements: 0.5cm length x 0.5cm width x 0.5cm depth; 0.098cm^3 volume. Character of Wound/Ulcer Post Debridement is improved. Post procedure Diagnosis Wound #6: Same as Pre-Procedure Plan Follow-up Appointments: Return Appointment in 1 week. - Dr. Lady Gary 1/22 @ 10:00 am Anesthetic: (In clinic) Topical Lidocaine 4% applied to wound bed Bathing/ Shower/ Hygiene: May shower and wash wound with soap and water. - with dressing changes Off-Loading: Open toe surgical shoe to: - to both feet with peg assist inserts Additional Orders / Instructions: Stop/Decrease Smoking Non Wound Condition: Apply the following to affected area as directed: - 40% urea cream to callouses daily (not in wound beds) WOUND #1: - Metatarsal head second Wound Laterality: Right Cleanser: Normal Saline 1 x Per Day/30 Days Discharge Instructions: Cleanse the wound with Normal Saline prior to applying a clean dressing using gauze sponges, not tissue or cotton balls. Cleanser: Soap and Water 1 x Per Day/30 Days Discharge Instructions: May shower and wash wound with dial antibacterial soap and water  prior to dressing change. Cleanser: Wound Cleanser 1 x Per Day/30 Days Discharge Instructions: Cleanse the wound with wound cleanser prior to applying a clean dressing using gauze sponges, not tissue or cotton balls. Topical: Silvadene Cream 1 x Per Day/30 Days Discharge Instructions: Apply thin layer to wound bed only Prim Dressing: Maxorb Extra Ag+ Alginate Dressing, 2x2 (in/in) 1 x Per Day/30 Days ary Discharge Instructions: Apply to wound bed as instructed Secondary Dressing: Woven Gauze Sponge, Non-Sterile 4x4 in 1 x Per Day/30 Days Discharge Instructions: Apply over primary dressing as directed. Secured With: Insurance underwriter, Sterile 2x75 (in/in) 1 x Per Day/30 Days Discharge Instructions: Secure with stretch gauze as directed. WOUND #2: - Metatarsal head first Wound Laterality: Left Cleanser: Normal Saline 1 x Per Day/30 Days Discharge Instructions: Cleanse the wound with Normal Saline prior to applying a clean dressing using gauze sponges, not tissue or cotton balls. Cleanser: Soap and Water 1 x Per Day/30 Days Discharge Instructions: May shower and wash wound with dial antibacterial soap and water prior to dressing change. Cleanser: Wound Cleanser 1 x Per Day/30 Days Discharge Instructions: Cleanse the wound with wound cleanser prior to applying a clean dressing using gauze sponges, not tissue or cotton balls. Topical: Silvadene Cream 1 x Per Day/30 Days Discharge Instructions: Apply thin layer to wound bed only Prim Dressing: Maxorb Extra  Ag+ Alginate Dressing, 2x2 (in/in) 1 x Per Day/30 Days ary Discharge Instructions: Apply to wound bed as instructed Secondary Dressing: Woven Gauze Sponge, Non-Sterile 4x4 in 1 x Per Day/30 Days Discharge Instructions: Apply over primary dressing as directed. Secured With: Insurance underwriter, Sterile 2x75 (in/in) 1 x Per Day/30 Days Discharge Instructions: Secure with stretch gauze as directed. WOUND #6: - Head -  occiput Wound Laterality: Cleanser: Soap and Water 1 x Per Day/30 Days Discharge Instructions: May shower and wash wound with dial antibacterial soap and water prior to dressing change. Topical: Silvadene Cream 1 x Per Day/30 Days KOLEMAN, MCKINNIES ([id number]) [phone].pdf Page 10 of 11 Discharge Instructions: Apply thin layer to wound bed only Prim Dressing: Maxorb Extra Ag+ Alginate Dressing, 2x2 (in/in) 1 x Per Day/30 Days ary Discharge Instructions: Apply to wound bed as instructed Secondary Dressing: Woven Gauze Sponges 2x2 in 1 x Per Day/30 Days Discharge Instructions: Apply over primary dressing as directed. Secured With: 32M Medipore H Soft Cloth Surgical T ape, 4 x 10 (in/yd) 1 x Per Day/30 Days Discharge Instructions: Secure with tape as directed. 11/03/2023: Both foot wounds are smaller underneath a layer of callus. He has a new wound on the back of his head where it sounds like a pimple or boil burst. The fat layer is exposed. There is no purulent material detected. I used a curette to debride the callus, skin, and slough from the left foot ulcer; this wound is nearly healed. I debrided callus, slough, and subcutaneous tissue from the right foot ulcer. I debrided slough and subcutaneous tissue from the new wound on his head. He will continue to use Silvadene and silver alginate on the feet. Will use silver alginate alone on his head. There is an area surrounding the head wound where he has plucked the hair and so a small circular Band-Aid should hold the silver alginate in place. He will follow-up in 1 week. Electronic Signature(s) Signed: 11/08/2023 2:44:18 PM By: Shawn Stall RN, BSN Signed: 11/08/2023 2:59:12 PM By: Duanne Guess MD FACS Previous Signature: 11/03/2023 10:10:04 AM Version By: Duanne Guess MD FACS Entered By: Shawn Stall on 11/08/2023  14:41:36 -------------------------------------------------------------------------------- SuperBill Details Patient Name: Date of Service: Eduardo [name] 11/03/2023 Medical Record Number: [id number] Patient Account Number: [id number] Date of Birth/Sex: Treating RN: [birth year] (53 y.o. Eduardo [name] Primary Care Provider: Saralyn Pilar Other Clinician: Referring Provider: Treating Provider/Extender: Harrie Jeans Weeks in Treatment: 14 Diagnosis Coding ICD-10 Codes Code Description [phone] Non-pressure chronic ulcer of other part of right foot with fat layer exposed L97.522 Non-pressure chronic ulcer of other part of left foot with fat layer exposed L98.492 Non-pressure chronic ulcer of skin of other sites with fat layer exposed G90.09 Other idiopathic peripheral autonomic neuropathy Z72.0 Tobacco use E66.01 Morbid (severe) obesity due to excess calories Facility Procedures : CPT4 Code: 32951884 Description: 11042 - DEB SUBQ TISSUE 20 SQ CM/< ICD-10 Diagnosis Description L97.512 Non-pressure chronic ulcer of other part of right foot with fat layer exposed L98.492 Non-pressure chronic ulcer of skin of other sites with fat layer exposed Modifier: Quantity: 1 : CPT4 Code: 16606301 Description: 97597 - DEBRIDE WOUND 1ST 20 SQ CM OR < ICD-10 Diagnosis Description L97.522 Non-pressure chronic ulcer of other part of left foot with fat layer exposed Modifier: Quantity: 1 Physician Procedures : CPT4 Code Description Modifier [id number] 99214 - WC PHYS LEVEL 4 - EST PT ICD-10 Diagnosis Description L97.512 Non-pressure chronic ulcer of other part  of right foot with fat layer exposed L97.522 Non-pressure chronic ulcer of other part of left foot  with fat layer exposed L98.492 Non-pressure chronic ulcer of skin of other sites with fat layer exposed [name] ([id number]) [phone].pdf P Z72.0 Tobacco use Quantity: 1 age 43 of  10 : [id number] 11042 - WC PHYS SUBQ TISS 20 SQ CM 1 ICD-10 Diagnosis Description L97.512 Non-pressure chronic ulcer of other part of right foot with fat layer exposed L98.492 Non-pressure chronic ulcer of skin of other sites with fat layer exposed Quantity: : [id number] 97597 - WC PHYS DEBR WO ANESTH 20 SQ CM 1 ICD-10 Diagnosis Description L97.522 Non-pressure chronic ulcer of other part of left foot with fat layer exposed Quantity: Electronic Signature(s) Signed: 11/03/2023 10:10:28 AM By: Duanne Guess MD FACS Entered By: Duanne Guess on 11/03/2023 10:10:28

## 2023-11-04 NOTE — Progress Notes (Addendum)
MAYCOL, SHOTTS ([id number]) [id number] Page 1 of 11 Visit Report for 11/03/2023 Arrival Information Details Patient Name: Date of Service: Eduardo [name] 11/03/2023 9:15 A M Medical Record Number: [id number] Patient Account Number: [id number] Date of Birth/Sex: Treating RN: 01-06-1971 (53 y.o. M) Primary Care Dace Denn: Saralyn Pilar Other Clinician: Referring Khala Tarte: Treating Tasmin Exantus/Extender: Park Liter in Treatment: 14 Visit Information History Since Last Visit Added or deleted any medications: No Patient Arrived: Ambulatory Any new allergies or adverse reactions: No Arrival Time: 09:21 Had a fall or experienced change in No Accompanied By: self activities of daily living that may affect Transfer Assistance: None risk of falls: Patient Identification Verified: Yes Signs or symptoms of abuse/neglect since last visito No Secondary Verification Process Completed: Yes Hospitalized since last visit: No Patient Requires Transmission-Based Precautions: No Implantable device outside of the clinic excluding No Patient Has Alerts: Yes cellular tissue based products placed in the center Patient Alerts: ABIs: R:1.11 L:1.18 9/24 since last visit: TBIs: R:1.13 L:1.02 9/24 Has Dressing in Place as Prescribed: Yes Pain Present Now: Yes Electronic Signature(s) Signed: 11/03/2023 5:36:28 PM By: Zenaida Deed RN, BSN Entered By: Zenaida Deed on 11/03/2023 09:30:16 -------------------------------------------------------------------------------- Complex / Palliative Patient Assessment Details Patient Name: Date of Service: Eduardo Ruder W. 11/03/2023 9:15 A M Medical Record Number: [id number] Patient Account Number: [id number] Date of Birth/Sex: Treating RN: 03/12/1971 (53 y.o. [name] Primary Care Janey Petron: Saralyn Pilar Other Clinician: Referring Lorinda Copland: Treating Jaquelyne Firkus/Extender: Harrie Jeans Weeks in Treatment: 14 Complex Wound Management Criteria Patient has remarkable or complex co-morbidities requiring medications or treatments that extend wound healing times. Examples: Diabetes mellitus with chronic renal failure or end stage renal disease requiring dialysis Advanced or poorly controlled rheumatoid arthritis Diabetes mellitus and end stage chronic obstructive pulmonary disease Active cancer with current chemo- or radiation therapy neuropathy, PVD, obesity, COPD Palliative Wound Management Criteria Care Approach Wound Care Plan: Complex Wound Management Electronic Signature(s) Signed: 11/08/2023 1:00:50 PM By: Zenaida Deed RN, BSN Signed: 11/08/2023 2:59:12 PM By: Duanne Guess MD FACS Entered By: Zenaida Deed on 11/08/2023 13:00:50 Eduardo [name] ([id number]) [id number] Page 2 of 11 -------------------------------------------------------------------------------- Encounter Discharge Information Details Patient Name: Date of Service: Eduardo [name] 11/03/2023 9:15 A M Medical Record Number: [id number] Patient Account Number: [id number] Date of Birth/Sex: Treating RN: 11-10-1970 (53 y.o. [name] Primary Care Mykell Rawl: Saralyn Pilar Other Clinician: Referring Geneveive Furness: Treating Chadley Dziedzic/Extender: Park Liter in Treatment: 14 Encounter Discharge Information Items Post Procedure Vitals Discharge Condition: Stable Temperature (F): 98.6 Ambulatory Status: Ambulatory Pulse (bpm): 93 Discharge Destination: Home Respiratory Rate (breaths/min): 18 Transportation: Private Auto Blood Pressure (mmHg): 163/97 Accompanied By: self Schedule Follow-up Appointment: Yes Clinical Summary of Care: Patient Declined Electronic Signature(s) Signed: 11/03/2023 5:36:28 PM By: Zenaida Deed RN, BSN Entered By: Zenaida Deed on 11/03/2023  10:15:22 -------------------------------------------------------------------------------- Lower Extremity Assessment Details Patient Name: Date of Service: Eduardo Ruder W. 11/03/2023 9:15 A M Medical Record Number: [id number] Patient Account Number: [id number] Date of Birth/Sex: Treating RN: May 05, 1971 (53 y.o. [name] Primary Care Limmie Schoenberg: Saralyn Pilar Other Clinician: Referring Arshawn Valdez: Treating Montavis Schubring/Extender: Harrie Jeans Weeks in Treatment: 14 Edema Assessment Assessed: Kyra Searles: No] Franne Forts: No] Edema: [Left: Yes] [Right: Yes] Calf Left: Right: Point of Measurement: From Medial Instep 46 cm 49 cm Ankle Left: Right: Point of Measurement: From Medial Instep 27 cm 28 cm Vascular Assessment  Pulses: Dorsalis Pedis Palpable: [Left:Yes] [Right:Yes] Extremity colors, hair growth, and conditions: Extremity Color: [Left:Hyperpigmented] [Right:Hyperpigmented] Hair Growth on Extremity: [Left:Yes] [Right:Yes] Temperature of Extremity: [Left:Warm] [Right:Warm] Capillary Refill: [Left:< 3 seconds] [Right:< 3 seconds] Dependent Rubor: [Left:No No] [Right:No No] Electronic Signature(s) Eduardo [name] ([id number]) [id number] Page 3 of 11 Signed: 11/03/2023 5:36:28 PM By: Zenaida Deed RN, BSN Entered By: Zenaida Deed on 11/03/2023 09:39:24 -------------------------------------------------------------------------------- Multi Wound Chart Details Patient Name: Date of Service: Eduardo Ruder W. 11/03/2023 9:15 A M Medical Record Number: [id number] Patient Account Number: [id number] Date of Birth/Sex: Treating RN: May 31, 1971 (53 y.o. [name] Primary Care Saesha Llerenas: Saralyn Pilar Other Clinician: Referring Sahvannah Rieser: Treating Leotha Voeltz/Extender: Harrie Jeans Weeks in Treatment: 14 Vital Signs Height(in): 77 Pulse(bpm): 93 Weight(lbs): 330 Blood Pressure(mmHg):  163/97 Body Mass Index(BMI): 39.1 Temperature(F): 98.6 Respiratory Rate(breaths/min): 20 [1:Photos:] Right Metatarsal head second Left Metatarsal head first Head - occiput Wound Location: Thermal Burn Gradually Appeared Bump Wounding Event: Neuropathic Ulcer-Non Diabetic Neuropathic Ulcer-Non Diabetic Abscess Primary Etiology: Lymphedema, Chronic Obstructive Lymphedema, Chronic Obstructive Lymphedema, Chronic Obstructive Comorbid History: Pulmonary Disease (COPD), Sleep Pulmonary Disease (COPD), Sleep Pulmonary Disease (COPD), Sleep Apnea, Deep Vein Thrombosis, Apnea, Deep Vein Thrombosis, Apnea, Deep Vein Thrombosis, Peripheral Arterial Disease, Peripheral Arterial Disease, Peripheral Arterial Disease, Osteoarthritis, Neuropathy Osteoarthritis, Neuropathy Osteoarthritis, Neuropathy 04/19/2023 05/20/2023 10/25/2023 Date Acquired: 14 14 0 Weeks of Treatment: Open Open Open Wound Status: No No No Wound Recurrence: 0.2x0.2x0.2 0.1x0.1x0.1 0.5x0.5x0.5 Measurements L x W x D (cm) 0.031 0.008 0.196 A (cm) : rea 0.006 0.001 0.098 Volume (cm) : 98.60% 98.20% [name] % Reduction in A rea: 98.60% 98.90% [name] % Reduction in Volume: 12 12 Starting Position 1 (o'clock): 12 12 Ending Position 1 (o'clock): 0.2 0.4 Maximum Distance 1 (cm): Yes No Yes Undermining: Full Thickness Without Exposed Full Thickness Without Exposed Full Thickness Without Exposed Classification: Support Structures Support Structures Support Structures Small None Present Medium Exudate A mount: Serosanguineous [name] Serosanguineous Exudate Type: red, brown [name] red, brown Exudate Color: Distinct, outline attached Indistinct, nonvisible Well defined, not attached Wound Margin: Large (67-100%) None Present (0%) Large (67-100%) Granulation A mount: Red [name] Red Granulation Quality: None Present (0%) None Present (0%) Small (1-33%) Necrotic A mount: Fat Layer (Subcutaneous Tissue): Yes Fat Layer (Subcutaneous  Tissue): Yes Fat Layer (Subcutaneous Tissue): Yes Exposed Structures: Fascia: No Fascia: No Fascia: No Tendon: No Tendon: No Tendon: No Muscle: No Muscle: No Muscle: No Joint: No Joint: No Joint: No Bone: No Bone: No Bone: No Medium (34-66%) Large (67-100%) None Epithelialization: Debridement - Excisional Debridement - Selective/Open Wound Debridement - Excisional Debridement: Pre-procedure Verification/Time Out 09:50 09:50 09:50 Taken: Lidocaine 4% Topical Solution Lidocaine 4% T opical Solution Lidocaine 4% Topical Solution Pain Control: Callus, Subcutaneous, Slough Callus, [employer], Slough Tissue Debrided: Eduardo [name] ([id number]) [id number] Page 4 of 11 Skin/Subcutaneous Tissue Skin/Epidermis Skin/Subcutaneous Tissue Level: 0.14 0.02 0.2 Debridement A (sq cm): rea Curette Curette Curette Instrument: None None None Bleeding: Pressure Pressure Pressure Hemostasis A chieved: 0 0 0 Procedural Pain: 0 0 0 Post Procedural Pain: Procedure was tolerated well Procedure was tolerated well Procedure was tolerated well Debridement Treatment Response: 0.3x0.3x0.2 0.1x0.1x0.1 0.5x0.5x0.5 Post Debridement Measurements L x W x D (cm) 0.014 0.001 0.098 Post Debridement Volume: (cm) Callus: Yes Callus: Yes No Abnormalities Noted Periwound Skin Texture: No Abnormalities Noted No Abnormalities Noted No Abnormalities Noted Periwound Skin Moisture: No Abnormalities Noted No Abnormalities Noted No Abnormalities Noted Periwound Skin Color: No Abnormality No Abnormality  No Abnormality Temperature: [name] [name] Yes Tenderness on Palpation: Debridement Debridement Debridement Procedures Performed: Treatment Notes Electronic Signature(s) Signed: 11/03/2023 10:06:07 AM By: Duanne Guess MD FACS Signed: 11/03/2023 5:36:28 PM By: Zenaida Deed RN, BSN Entered By: Duanne Guess on 11/03/2023  10:06:07 -------------------------------------------------------------------------------- Multi-Disciplinary Care Plan Details Patient Name: Date of Service: Eduardo Ruder W. 11/03/2023 9:15 A M Medical Record Number: [id number] Patient Account Number: [id number] Date of Birth/Sex: Treating RN: 1971/05/22 (53 y.o. [name] Primary Care Kayliee Atienza: Saralyn Pilar Other Clinician: Referring Gorden Stthomas: Treating Naydeline Morace/Extender: Park Liter in Treatment: 14 Multidisciplinary Care Plan reviewed with physician Active Inactive Wound/Skin Impairment Nursing Diagnoses: Impaired tissue integrity Knowledge deficit related to ulceration/compromised skin integrity Goals: Patient will demonstrate a reduced rate of smoking or cessation of smoking Date Initiated: 07/22/2023 Target Resolution Date: 11/19/2023 Goal Status: Active Patient/caregiver will verbalize understanding of skin care regimen Date Initiated: 07/22/2023 Target Resolution Date: 11/19/2023 Goal Status: Active Interventions: Assess patient/caregiver ability to obtain necessary supplies Assess patient/caregiver ability to perform ulcer/skin care regimen upon admission and as needed Assess ulceration(s) every visit Provide education on smoking Treatment Activities: Referred to DME Helyn Schwan for dressing supplies : 07/22/2023 Skin care regimen initiated : 07/22/2023 Topical wound management initiated : 07/22/2023 Notes: Electronic Signature(s) Eduardo [name] ([id number]) [id number].pdf Page 5 of 11 Signed: 11/03/2023 5:36:28 PM By: Zenaida Deed RN, BSN Entered By: Zenaida Deed on 11/03/2023 09:27:50 -------------------------------------------------------------------------------- Pain Assessment Details Patient Name: Date of Service: Eduardo Ruder W. 11/03/2023 9:15 A M Medical Record Number: [id number] Patient Account Number: [id number] Date of  Birth/Sex: Treating RN: 10-27-1970 (53 y.o. M) Primary Care Selda Jalbert: Saralyn Pilar Other Clinician: Referring Caulder Wehner: Treating Jenisse Vullo/Extender: Park Liter in Treatment: 14 Active Problems Location of Pain Severity and Description of Pain Patient Has Paino Yes Site Locations Pain Location: Generalized Pain, Pain in Ulcers With Dressing Change: No Duration of the Pain. Constant / Intermittento Constant Rate the pain. Current Pain Level: 7 Worst Pain Level: 10 Least Pain Level: 0 Tolerable Pain Level: 4 Character of Pain Describe the Pain: Sharp, Shooting, Other: neuropaathy pain Pain Management and Medication Current Pain Management: Medication: Yes Is the Current Pain Management Adequate: Adequate Rest: Yes How does your wound impact your activities of daily livingo Sleep: Yes Bathing: No Appetite: No Relationship With Others: No Bladder Continence: No Emotions: Yes Bowel Continence: No Work: No Toileting: No Drive: No Dressing: No Hobbies: No Electronic Signature(s) Signed: 11/03/2023 5:36:28 PM By: Zenaida Deed RN, BSN Entered By: Zenaida Deed on 11/03/2023 09:39:14 -------------------------------------------------------------------------------- Patient/Caregiver Education Details Patient Name: Date of Service: Eduardo [name] 1/15/2025andnbsp9:15 Ethlyn Daniels ([id number]) [id number] Page 6 of 11 Medical Record Number: [id number] Patient Account Number: [id number] Date of Birth/Gender: Treating RN: Sep 15, 1971 (53 y.o. [name] Primary Care Physician: Saralyn Pilar Other Clinician: Referring Physician: Treating Physician/Extender: Park Liter in Treatment: 14 Education Assessment Education Provided To: Patient Education Topics Provided Offloading: Methods: Explain/Verbal Responses: Reinforcements needed, State content  correctly Smoking and Wound Healing: Methods: Explain/Verbal Responses: Reinforcements needed, State content correctly Wound/Skin Impairment: Methods: Explain/Verbal Responses: Reinforcements needed, State content correctly Electronic Signature(s) Signed: 11/03/2023 5:36:28 PM By: Zenaida Deed RN, BSN Entered By: Zenaida Deed on 11/03/2023 09:28:17 -------------------------------------------------------------------------------- Wound Assessment Details Patient Name: Date of Service: Eduardo Ruder W. 11/03/2023 9:15 A M Medical Record Number: [id number] Patient Account Number: [id number] Date of Birth/Sex: Treating RN:  [birth year] (52 y.o. M) Primary Care Taariq Leitz: Saralyn Pilar Other Clinician: Referring Nashawn Hillock: Treating Kayla Deshaies/Extender: Harrie Jeans Weeks in Treatment: 14 Wound Status Wound Number: 1 Primary Neuropathic Ulcer-Non Diabetic Etiology: Wound Location: Right Metatarsal head second Wound Open Wounding Event: Thermal Burn Status: Date Acquired: 04/19/2023 Comorbid Lymphedema, Chronic Obstructive Pulmonary Disease (COPD), Weeks Of Treatment: 14 History: Sleep Apnea, Deep Vein Thrombosis, Peripheral Arterial Disease, Clustered Wound: No Osteoarthritis, Neuropathy Photos Wound Measurements Length: (cm) 0.2 Width: (cm) 0.2 Depth: (cm) 0.2 Eduardo [name] ([id number]) Area: (cm) 0 Volume: (cm) 0 % Reduction in Area: 98.6% % Reduction in Volume: 98.6% Epithelialization: Medium (34-66%) [id number] Page 7 of 11 .031 Tunneling: No .006 Undermining: Yes Starting Position (o'clock): 12 Ending Position (o'clock): 12 Maximum Distance: (cm) 0.2 Wound Description Classification: Full Thickness Without Exposed Support Structures Wound Margin: Distinct, outline attached Exudate Amount: Small Exudate Type: Serosanguineous Exudate Color: red, brown Foul Odor After Cleansing: No Slough/Fibrino No Wound  Bed Granulation Amount: Large (67-100%) Exposed Structure Granulation Quality: Red Fascia Exposed: No Necrotic Amount: None Present (0%) Fat Layer (Subcutaneous Tissue) Exposed: Yes Tendon Exposed: No Muscle Exposed: No Joint Exposed: No Bone Exposed: No Periwound Skin Texture Texture Color No Abnormalities Noted: No No Abnormalities Noted: Yes Callus: Yes Temperature / Pain Temperature: No Abnormality Moisture No Abnormalities Noted: Yes Treatment Notes Wound #1 (Metatarsal head second) Wound Laterality: Right Cleanser Normal Saline Discharge Instruction: Cleanse the wound with Normal Saline prior to applying a clean dressing using gauze sponges, not tissue or cotton balls. Soap and Water Discharge Instruction: May shower and wash wound with dial antibacterial soap and water prior to dressing change. Wound Cleanser Discharge Instruction: Cleanse the wound with wound cleanser prior to applying a clean dressing using gauze sponges, not tissue or cotton balls. Peri-Wound Care Topical Silvadene Cream Discharge Instruction: Apply thin layer to wound bed only Primary Dressing Maxorb Extra Ag+ Alginate Dressing, 2x2 (in/in) Discharge Instruction: Apply to wound bed as instructed Secondary Dressing Woven Gauze Sponge, Non-Sterile 4x4 in Discharge Instruction: Apply over primary dressing as directed. Secured With Conforming Stretch Gauze Bandage, Sterile 2x75 (in/in) Discharge Instruction: Secure with stretch gauze as directed. Compression Wrap Compression Stockings Add-Ons Electronic Signature(s) Signed: 11/03/2023 5:36:28 PM By: Zenaida Deed RN, BSN Entered By: Zenaida Deed on 11/03/2023 09:43:07 Eduardo [name] ([id number]) [id number] Page 8 of 11 -------------------------------------------------------------------------------- Wound Assessment Details Patient Name: Date of Service: Eduardo [name] 11/03/2023 9:15 A M Medical  Record Number: [id number] Patient Account Number: [id number] Date of Birth/Sex: Treating RN: [birth year] (53 y.o. M) Primary Care Safina Huard: Saralyn Pilar Other Clinician: Referring Sulaiman Imbert: Treating Natthew Marlatt/Extender: Harrie Jeans Weeks in Treatment: 14 Wound Status Wound Number: 2 Primary Neuropathic Ulcer-Non Diabetic Etiology: Wound Location: Left Metatarsal head first Wound Open Wounding Event: Gradually Appeared Status: Date Acquired: 05/20/2023 Comorbid Lymphedema, Chronic Obstructive Pulmonary Disease (COPD), Weeks Of Treatment: 14 History: Sleep Apnea, Deep Vein Thrombosis, Peripheral Arterial Disease, Clustered Wound: No Osteoarthritis, Neuropathy Photos Wound Measurements Length: (cm) 0.1 Width: (cm) 0.1 Depth: (cm) 0.1 Area: (cm) 0.008 Volume: (cm) 0.001 % Reduction in Area: 98.2% % Reduction in Volume: 98.9% Epithelialization: Large (67-100%) Tunneling: No Undermining: No Wound Description Classification: Full Thickness Without Exposed Support Structures Wound Margin: Indistinct, nonvisible Exudate Amount: None Present Foul Odor After Cleansing: No Slough/Fibrino No Wound Bed Granulation Amount: None Present (0%) Exposed Structure Necrotic Amount: None Present (0%) Fascia Exposed: No Fat Layer (Subcutaneous Tissue) Exposed: Yes Tendon Exposed: No Muscle Exposed: No Joint Exposed:  No Bone Exposed: No Periwound Skin Texture Texture Color No Abnormalities Noted: No No Abnormalities Noted: Yes Callus: Yes Temperature / Pain Temperature: No Abnormality Moisture No Abnormalities Noted: Yes Treatment Notes Wound #2 (Metatarsal head first) Wound Laterality: Left Cleanser Normal Saline Discharge Instruction: Cleanse the wound with Normal Saline prior to applying a clean dressing using gauze sponges, not tissue or cotton balls. SERVANDO, BURGHART ([id number]) [id number] Page 9 of 11 Soap and Water Discharge  Instruction: May shower and wash wound with dial antibacterial soap and water prior to dressing change. Wound Cleanser Discharge Instruction: Cleanse the wound with wound cleanser prior to applying a clean dressing using gauze sponges, not tissue or cotton balls. Peri-Wound Care Topical Silvadene Cream Discharge Instruction: Apply thin layer to wound bed only Primary Dressing Maxorb Extra Ag+ Alginate Dressing, 2x2 (in/in) Discharge Instruction: Apply to wound bed as instructed Secondary Dressing Woven Gauze Sponge, Non-Sterile 4x4 in Discharge Instruction: Apply over primary dressing as directed. Secured With Conforming Stretch Gauze Bandage, Sterile 2x75 (in/in) Discharge Instruction: Secure with stretch gauze as directed. Compression Wrap Compression Stockings Add-Ons Electronic Signature(s) Signed: 11/03/2023 5:36:28 PM By: Zenaida Deed RN, BSN Entered By: Zenaida Deed on 11/03/2023 09:43:28 -------------------------------------------------------------------------------- Wound Assessment Details Patient Name: Date of Service: Eduardo Ruder W. 11/03/2023 9:15 A M Medical Record Number: [id number] Patient Account Number: [id number] Date of Birth/Sex: Treating RN: 07-Oct-1971 (53 y.o. [name] Primary Care Drezden Seitzinger: Saralyn Pilar Other Clinician: Referring Haadiya Frogge: Treating Tamey Wanek/Extender: Harrie Jeans Weeks in Treatment: 14 Wound Status Wound Number: 6 Primary Abscess Etiology: Wound Location: Head - occiput Wound Open Wounding Event: Bump Status: Date Acquired: 10/25/2023 Comorbid Lymphedema, Chronic Obstructive Pulmonary Disease (COPD), Weeks Of Treatment: 0 History: Sleep Apnea, Deep Vein Thrombosis, Peripheral Arterial Disease, Clustered Wound: No Osteoarthritis, Neuropathy Photos Wound Measurements Length: (cm) Eduardo [name] ([id number]) Width: (cm) 0. Depth: (cm) 0. Area: (cm) 0 Volume: (cm) 0 0.5 %  Reduction in Area: [id number] Page 10 of 11 5 % Reduction in Volume: 5 Epithelialization: None .196 Tunneling: No .098 Undermining: Yes Starting Position (o'clock): 12 Ending Position (o'clock): 12 Maximum Distance: (cm) 0.4 Wound Description Classification: Full Thickness Without Exposed Support Structures Wound Margin: Well defined, not attached Exudate Amount: Medium Exudate Type: Serosanguineous Exudate Color: red, brown Foul Odor After Cleansing: No Slough/Fibrino Yes Wound Bed Granulation Amount: Large (67-100%) Exposed Structure Granulation Quality: Red Fascia Exposed: No Necrotic Amount: Small (1-33%) Fat Layer (Subcutaneous Tissue) Exposed: Yes Necrotic Quality: Adherent Slough Tendon Exposed: No Muscle Exposed: No Joint Exposed: No Bone Exposed: No Periwound Skin Texture Texture Color No Abnormalities Noted: Yes No Abnormalities Noted: Yes Moisture Temperature / Pain No Abnormalities Noted: Yes Temperature: No Abnormality Tenderness on Palpation: Yes Treatment Notes Wound #6 (Head - occiput) Cleanser Soap and Water Discharge Instruction: May shower and wash wound with dial antibacterial soap and water prior to dressing change. Peri-Wound Care Topical Silvadene Cream Discharge Instruction: Apply thin layer to wound bed only Primary Dressing Maxorb Extra Ag+ Alginate Dressing, 2x2 (in/in) Discharge Instruction: Apply to wound bed as instructed Secondary Dressing Woven Gauze Sponges 2x2 in Discharge Instruction: Apply over primary dressing as directed. Secured With 71M Medipore H Soft Cloth Surgical T ape, 4 x 10 (in/yd) Discharge Instruction: Secure with tape as directed. Compression Wrap Compression Stockings Add-Ons Electronic Signature(s) Signed: 11/03/2023 5:36:28 PM By: Zenaida Deed RN, BSN Entered By: Zenaida Deed on 11/03/2023 09:47:00 Eduardo [name] ([id number]) [id number] Page  11 of 11 -------------------------------------------------------------------------------- Vitals Details  Patient Name: Date of Service: Eduardo [name] 11/03/2023 9:15 A M Medical Record Number: [id number] Patient Account Number: [id number] Date of Birth/Sex: Treating RN: Mar 20, 1971 (53 y.o. M) Primary Care Sandhya Denherder: Saralyn Pilar Other Clinician: Referring Dandrae Kustra: Treating Admir Candelas/Extender: Park Liter in Treatment: 14 Vital Signs Time Taken: 09:22 Temperature (F): 98.6 Height (in): 77 Pulse (bpm): 93 Weight (lbs): 330 Respiratory Rate (breaths/min): 20 Body Mass Index (BMI): 39.1 Blood Pressure (mmHg): 163/97 Reference Range: 80 - 120 mg / dl Electronic Signature(s) Signed: 11/03/2023 5:36:28 PM By: Zenaida Deed RN, BSN Entered By: Zenaida Deed on 11/03/2023 09:30:33

## 2023-11-10 ENCOUNTER — Encounter (HOSPITAL_BASED_OUTPATIENT_CLINIC_OR_DEPARTMENT_OTHER): Payer: Medicaid Other | Admitting: General Surgery

## 2023-11-10 DIAGNOSIS — L97512 Non-pressure chronic ulcer of other part of right foot with fat layer exposed: Secondary | ICD-10-CM | POA: Diagnosis not present

## 2023-11-17 ENCOUNTER — Ambulatory Visit (HOSPITAL_BASED_OUTPATIENT_CLINIC_OR_DEPARTMENT_OTHER): Payer: Medicaid Other | Admitting: General Surgery

## 2023-11-24 ENCOUNTER — Ambulatory Visit (HOSPITAL_BASED_OUTPATIENT_CLINIC_OR_DEPARTMENT_OTHER): Payer: Medicaid Other | Admitting: General Surgery

## 2023-11-25 ENCOUNTER — Encounter: Payer: Self-pay | Admitting: Family Medicine

## 2023-12-01 ENCOUNTER — Ambulatory Visit (HOSPITAL_BASED_OUTPATIENT_CLINIC_OR_DEPARTMENT_OTHER): Payer: Medicaid Other | Admitting: General Surgery

## 2023-12-08 ENCOUNTER — Ambulatory Visit (HOSPITAL_BASED_OUTPATIENT_CLINIC_OR_DEPARTMENT_OTHER): Payer: Medicaid Other | Admitting: General Surgery

## 2023-12-27 ENCOUNTER — Ambulatory Visit: Payer: Self-pay | Admitting: Family Medicine

## 2023-12-27 NOTE — Telephone Encounter (Signed)
  Chief Complaint: 3 "bumps" to back, hx of MRSA Symptoms: red, swollen hard bumps to left shoulder blade, middle of back and base of neck Frequency: x couple of weeks Pertinent Negatives: Patient denies fever, chills, rash to other parts of body. Disposition: [] ED /[] Urgent Care (no appt availability in office) / [x] Appointment(In office/virtual)/ []  [facility]/ [] Home Care/ [] Refused Recommended Disposition /[] [facility]/ []  Follow-up with PCP Additional Notes: Patient states he has a history of abscesses and MRSA. He states last time he had a large one on his back that had to be cut into. Patient states he started taking an antibiotic that he had leftover at his house and it made the abscess worse. Patient states he has been taking his back pain medication (Percocet). Patient states it is hard to take care of the bumps/keep them clean due to he can not reach them on his back. Patient scheduled for acute visit tomorrow with [facility] (per [facility] provider can see cyst removal/abscess).   Copied from CRM [id number]. Topic: Clinical - Red Word Triage >> Dec 27, 2023 10:25 AM Tiffany B wrote: Red Word that prompted transfer to Nurse Triage: Patient has 3 large bumps on his back and states his painful to touch. Reason for Disposition  Boil > 2 inches across (> 5 cm; larger than a golf ball or ping pong ball)  Answer Assessment - Initial Assessment Questions 1. APPEARANCE of BOIL: "What does the boil look like?"      Red, swollen hard knots. The one in the middle of the back has yellow, pus drainage.  2. LOCATION: "Where is the boil located?"      One is in the middle of back, one to left shoulder blade and one to base of neck.  3. NUMBER: "How many boils are there?"      3.  4. SIZE: "How big is the boil?" (e.g., inches, cm; compare to size of a coin or other object)     The one on his back and shoulder are about the size of the palm of his sister's hand, the one  at the base of his neck is bigger than a quarter.  5. ONSET: "When did the boil start?"     Couple of weeks ago.  6. PAIN: "Is there any pain?" If Yes, ask: "How bad is the pain?"   (Scale 1-10; or mild, moderate, severe)     Severe painful to touch.  7. FEVER: "Do you have a fever?" If Yes, ask: "What is it, how was it measured, and when did it start?"      Denies.  8. SOURCE: "Have you been around anyone with boils or other Staph infections?" "Have you ever had boils before?"     Patient states he has had MRSA before and states last time they had to cut his back and get it out.  9. OTHER SYMPTOMS: "Do you have any other symptoms?" (e.g., shaking chills, weakness, rash elsewhere on body)     Fatigue.  Protocols used: Boil (Skin Abscess)-A-AH

## 2023-12-28 ENCOUNTER — Ambulatory Visit: Payer: Medicaid Other | Admitting: Vascular Surgery

## 2023-12-28 ENCOUNTER — Encounter: Payer: Self-pay | Admitting: Internal Medicine

## 2023-12-28 ENCOUNTER — Ambulatory Visit (INDEPENDENT_AMBULATORY_CARE_PROVIDER_SITE_OTHER): Admitting: Internal Medicine

## 2023-12-28 VITALS — BP 160/90 | HR 76 | Temp 98.0°F | Ht 77.0 in | Wt 334.0 lb

## 2023-12-28 DIAGNOSIS — L0291 Cutaneous abscess, unspecified: Secondary | ICD-10-CM

## 2023-12-28 MED ORDER — SULFAMETHOXAZOLE-TRIMETHOPRIM 800-160 MG PO TABS: 1.0000 | ORAL_TABLET | Freq: Two times a day (BID) | ORAL | 0 refills | Status: AC

## 2023-12-28 NOTE — Progress Notes (Signed)
 [facility] [address] [place] [address] Dept: [phone] Dept Fax: [phone]  Acute Care Office Visit  Subjective:   Eduardo [name] 03-Apr-1971 12/28/2023  Chief Complaint  Patient presents with   Mass    On back noticed a week ago     HPI: Discussed the use of AI scribe software for clinical note transcription with the patient, who gave verbal consent to proceed.  History of Present Illness   The patient presents with recurrent skin abscesses, which he attributes to ingrown hairs. He reports prior abscesses have previously been infected with MRSA. He currently has two abscesses of concern, one on his back and one on his left shoulder.  The patient has previously had abscesses that needed to be opened and drained. He has tried warm compresses and warm showers to alleviate the discomfort, but reports that heat makes the abscesses swell.      The following portions of the patient's history were reviewed and updated as appropriate: past medical history, past surgical history, family history, social history, allergies, medications, and problem list.   Patient Active Problem List   Diagnosis Date Noted   Left lower quadrant abdominal pain 03/23/2023   Psychophysiological insomnia 03/23/2023   Vitamin D deficiency 03/23/2023   Nocturia 01/20/2023   Chronic rhinitis 12/25/2021   Lymphedema of lower extremity 09/29/2021   Insertional Achilles tendinopathy 08/07/2021   Chronic obstructive pulmonary disease (HCC) 01/14/2021   Hypertriglyceridemia 04/12/2020   Ulcer of both feet with fat layer exposed (HCC)    Recurrent infections 03/07/2019   Bilateral lower extremity edema 04/07/2018   Stasis dermatitis of both legs 04/07/2018   Low libido 02/09/2018   OSA on CPAP 02/03/2018   Chronic pansinusitis 12/07/2017   Hypertrophy, nasal, turbinate 12/07/2017   Deviated septum 11/11/2017   Umbilical hernia without  obstruction and without gangrene 11/11/2017   Lumbar degenerative disc disease 08/02/2017   Chronic folliculitis 04/10/2017   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   Major depression 12/26/2012   Generalized anxiety disorder 12/26/2012   Opiate dependence (HCC) 12/24/2012   Benzodiazepine dependence (HCC) 12/24/2012   HTN (hypertension) 12/24/2012   Morbid obesity (HCC) 09/09/2010   Nicotine dependence with current use 09/09/2010   Past Medical History:  Diagnosis Date   Anxiety    Chronic back pain    Degenerative disk disease    Depression    DVT of upper extremity (deep vein thrombosis) (HCC)    GERD (gastroesophageal reflux disease)    Hypertension    MRSA (methicillin resistant staph aureus) culture positive    PAD (peripheral artery disease) (HCC)    Peripheral arterial disease (HCC)    Peripheral neuropathy    Pilonidal cyst    Pruritus 12/23/2021   Pulmonary embolus (HCC)    no blood thinner 2002   Sleep apnea    can't use cpap due to deviated nasal septumg   Past Surgical History:  Procedure Laterality Date   Irrigation and debridement of back abscess     MASS EXCISION Right 06/14/2019   Procedure: EXCISION BENIGN LESION RIGHT FOOT;  Surgeon: Park Liter, DPM;  Location: [facility];  Service: Podiatry;  Laterality: Right;   NASAL SEPTOPLASTY W/ TURBINOPLASTY [name] 02/03/2018   Procedure: NASAL SEPTOPLASTY WITH TURBINATE REDUCTION;  Surgeon: Suzanna Obey, MD;  Location: [facility] OR;  Service: ENT;  Laterality: [name];   PILONIDAL CYST DRAINAGE [name] 04/10/2017   Procedure: IRRIGATION AND DEBRIDEMENT BACK ABSCESS;  Surgeon: Michaell Cowing,  Viviann Spare, MD;  Location: [facility];  Service: General;  Laterality: [name];   SINUS ENDO WITH FUSION [name] 02/03/2018   Procedure: ENDOSCOPIC SINUS SURGERY WITH FUSION;  Surgeon: Suzanna Obey, MD;  Location: [facility];  Service: ENT;  Laterality: [name];   THORACIC OUTLET SURGERY  2000   TOE ARTHROPLASTY Right 06/14/2019   Procedure: HALLUX ARTHROPLASTY RIGHT  FOOT WITH TISSUE TRANSER EXCISION OF SESAMOID BONE;  Surgeon: Park Liter, DPM;  Location: [facility];  Service: Podiatry;  Laterality: Right;   WRIST FUSION  2002   Family History  Problem Relation Age of Onset   Hypertension Mother    Diabetes Mother    Hypertension Father    Heart disease Father    Pancreatic cancer Brother    Cancer Maternal Grandmother        breast, ovarian & colon   Stroke Paternal Grandfather    Neuropathy Neg Hx    Colon cancer Neg Hx    Esophageal cancer Neg Hx    Liver disease Neg Hx     Current Outpatient Medications:    Acetaminophen (TYLENOL ARTHRITIS PAIN PO), Take 600 mg by mouth 3 (three) times daily., Disp: , Rfl:    desvenlafaxine (PRISTIQ) 50 MG 24 hr tablet, Take 1 tablet (50 mg total) by mouth daily., Disp: 30 tablet, Rfl: 3   furosemide (LASIX) 20 MG tablet, Take 20 mg by mouth daily., Disp: , Rfl:    oxyCODONE-acetaminophen (PERCOCET) 10-325 MG tablet, Take 1 tablet by mouth every 6 (six) hours as needed for pain., Disp: , Rfl:    QUEtiapine (SEROQUEL) 25 MG tablet, TAKE 1 TABLET BY MOUTH EVERY NIGHT AT BEDTIME, Disp: 30 tablet, Rfl: 2   sulfamethoxazole-trimethoprim (BACTRIM DS) 800-160 MG tablet, Take 1 tablet by mouth 2 (two) times daily for 7 days., Disp: 14 tablet, Rfl: 0   mupirocin ointment (BACTROBAN) 2 %, Apply 1 Application topically daily. (Patient not taking: Reported on 12/28/2023), Disp: 22 g, Rfl: 3 Allergies  Allergen Reactions   Gabapentin Other (See Comments)   Pollen Extract Cough   Dust Mite Extract Other (See Comments)   Other Other (See Comments)    Ragweed    Pregabalin Swelling     ROS: A complete ROS was performed with pertinent positives/negatives noted in the HPI. The remainder of the ROS are negative.    Objective:   Today's Vitals   12/28/23 1012  BP: (!) 160/90  Pulse: 76  Temp: 98 F (36.7 C)  TempSrc: Temporal  SpO2: 97%  Weight: (!) 334 lb (151.5 kg)  Height: 6\' 5"  (1.956 m)    GENERAL:  Well-appearing, in NAD. Well nourished.  SKIN: Pink, warm and dry. 3cm x 3cm fluctuant abscess with warmth and redness to left shoulder, 4cm x 4cm fluctuant abscess with warmth and redness to midline lower back. No active drainage.  RESPIRATORY: Respirations even and non-labored. PSYCH/MENTAL STATUS: Alert, oriented x 3. Cooperative, appropriate mood and affect.    No results found for any visits on 12/28/23.    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 Incision and drainage recommended. - Prescribed Bactrim BID for 7 days. - Advised warm compresses to facilitate drainage. - Referred to [facility] for incision and drainage. Appt today at 4PM, patient made aware to be there at 3:30PM. Address provided on d/c paperwork    Meds ordered this encounter  Medications  sulfamethoxazole-trimethoprim (BACTRIM DS) 800-160 MG tablet    Sig: Take 1 tablet by mouth 2 (two) times daily for 7 days.    Dispense:  14 tablet    Refill:  0    Supervising Provider:   Garnette Gunner [[id number]]   No orders of the defined types were placed in this encounter.  Lab Orders  No laboratory test(s) ordered today   No images are attached to the encounter or orders placed in the encounter.  Return if symptoms worsen or fail to improve.   Salvatore Decent, FNP

## 2023-12-28 NOTE — Patient Instructions (Addendum)
 Take antibiotic as prescribed  Warm compresses to areas   Follow up with general surgery  [address] Be there at 3:30PM, appointment at [facility]

## 2024-01-31 ENCOUNTER — Ambulatory Visit: Admitting: Internal Medicine

## 2024-02-02 ENCOUNTER — Ambulatory Visit (INDEPENDENT_AMBULATORY_CARE_PROVIDER_SITE_OTHER): Admitting: Internal Medicine

## 2024-02-02 ENCOUNTER — Encounter: Payer: Self-pay | Admitting: Internal Medicine

## 2024-02-02 ENCOUNTER — Other Ambulatory Visit: Payer: Self-pay

## 2024-02-02 VITALS — BP 161/107 | HR 75 | Temp 98.2°F | Wt 341.6 lb

## 2024-02-02 DIAGNOSIS — L989 Disorder of the skin and subcutaneous tissue, unspecified: Secondary | ICD-10-CM

## 2024-02-02 MED ORDER — DOXYCYCLINE HYCLATE 100 MG PO TABS: 100.0000 mg | ORAL_TABLET | Freq: Two times a day (BID) | ORAL | 0 refills | Status: DC

## 2024-02-02 NOTE — Progress Notes (Unsigned)
 Patient Active Problem List   Diagnosis Date Noted   Left lower quadrant abdominal pain 03/23/2023   Psychophysiological insomnia 03/23/2023   Vitamin D deficiency 03/23/2023   Nocturia 01/20/2023   Chronic rhinitis 12/25/2021   Lymphedema of lower extremity 09/29/2021   Insertional Achilles tendinopathy 08/07/2021   Chronic obstructive pulmonary disease (HCC) 01/14/2021   Hypertriglyceridemia 04/12/2020   Ulcer of both feet with fat layer exposed (HCC)    Recurrent infections 03/07/2019   Bilateral lower extremity edema 04/07/2018   Stasis dermatitis of both legs 04/07/2018   Low libido 02/09/2018   OSA on CPAP 02/03/2018   Chronic pansinusitis 12/07/2017   Hypertrophy, nasal, turbinate 12/07/2017   Deviated septum 11/11/2017   Umbilical hernia without obstruction and without gangrene 11/11/2017   Lumbar degenerative disc disease 08/02/2017   Chronic folliculitis 04/10/2017   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   DESVENLAFAXINE (PRISTIQ) 50 MG 24 HR TABLET    Take 1 tablet (50 mg total) by mouth daily.   FUROSEMIDE (LASIX) 20 MG TABLET    Take 20 mg by mouth daily.   MUPIROCIN OINTMENT (BACTROBAN) 2 %    Apply 1 Application topically daily.   OXYCODONE-ACETAMINOPHEN (PERCOCET) 10-325 MG TABLET    Take 1 tablet by mouth every 6 (six) hours as needed for pain.   QUETIAPINE (SEROQUEL) 25 MG TABLET    TAKE 1 TABLET BY MOUTH EVERY NIGHT AT BEDTIME  Modified Medications   No medications on file   Discontinued Medications   No medications on file    Subjective: 53 year old male with past medical history of GERD, DVT upper extremity, MRSA skin soft tissue infection, pleuritis, PE presents for management of skin lesions.  He was last seen by Dr. Dollene Cleveland on 3//7/23 for recurrent skin pathology in part thought to be MRSA.  At that point he had told the provider that patient been suffering from this since 2000.  He attributed problem to a prolonged emphasis that developed.  Got a brace on his leg, buttocks where he had pilonidal cyst.  He had taken doxycycline for flare of blepharitis.  Tried decolonization with Hibiclens.  When he was first seen he was on several months of Doxy which he thinks he had it helped.  He had been receiving Bactrim prescription as well in the past but not from Dr. Algis Liming.  Per note it was noted that did not think it was MRSA or driving his pathology due to the intense itching and scratching that is causing the pathology.  He was referred to wound care.  Given 1 month of doxycycline.  He was seen by [facility] in consultation for recurrent "skin infection.  In referred back to infectious disease.  He was having skin infection at the gastric region.  No fluctuance.  He thought it was improving on antibiotics. Today @DATE @ : Discussed the use of AI scribe software for clinical note transcription with the patient, who gave verbal consent to proceed.  History of Present Illness  Review of Systems: ROS  Past Medical History:  Diagnosis Date   Anxiety    Chronic back pain    Degenerative disk disease    Depression    DVT of upper extremity (deep vein thrombosis) (HCC)    GERD (gastroesophageal reflux disease)    Hypertension    MRSA (methicillin resistant staph aureus) culture positive    PAD (peripheral artery disease) (HCC)    Peripheral arterial disease (HCC)    Peripheral neuropathy    Pilonidal cyst    Pruritus 12/23/2021   Pulmonary embolus (HCC)     no blood thinner 2002   Sleep apnea    can't use cpap due to deviated nasal septumg    Social History   Tobacco Use   Smoking status: Every Day    Current packs/day: 1.00    Average packs/day: 1 pack/day for 33.0 years (33.0 ttl pk-yrs)    Types: Cigarettes    Passive exposure: Never   Smokeless tobacco: Never   Tobacco comments:    1pack per day 09/22/2022  Vaping Use   Vaping status: Former  Substance Use Topics   Alcohol use: No    Alcohol/week: 0.0 standard drinks of alcohol    Comment: Rarely   Drug use: No    Family History  Problem Relation Age of Onset   Hypertension Mother    Diabetes Mother    Hypertension Father    Heart disease Father    Pancreatic cancer Brother    Cancer Maternal Grandmother        breast, ovarian & colon   Stroke Paternal Grandfather    Neuropathy Neg Hx    Colon cancer Neg Hx    Esophageal cancer Neg Hx    Liver disease Neg Hx     Allergies  Allergen Reactions   Gabapentin Other (See Comments)   Pollen Extract Cough   Dust Mite Extract Other (See Comments)   Other Other (See Comments)    Ragweed    Pregabalin Swelling    Health Maintenance  Topic Date Due   COVID-19 Vaccine (1) Never done   Pneumococcal Vaccine 5-52 Years old (1 of 2 - PCV) Never done   Zoster Vaccines- Shingrix (1 of 2) Never done   Lung Cancer Screening  10/27/2023   INFLUENZA VACCINE  05/19/2024   Fecal DNA (Cologuard)  04/01/2026   DTaP/Tdap/Td (2 - Td or Tdap) 07/31/2027   Hepatitis C Screening  Completed   HIV Screening  Completed   HPV VACCINES  Aged Out   Meningococcal B Vaccine  Aged Out    Objective:  Vitals:   02/02/24 0944  BP: (!) 161/107  Pulse: 75  Temp: 98.2 F (36.8 C)  TempSrc: Oral  SpO2: 97%  Weight: (!) 341 lb 9.6 oz (154.9 kg)   Body mass index is 40.51 kg/m.  Physical Exam Physical Exam   Lab Results Lab Results  Component Value Date   WBC 9.3 03/16/2023   HGB 15.8 03/16/2023   HCT 46.5 03/16/2023    MCV 90 03/16/2023   PLT 187 03/16/2023    Lab Results  Component Value Date   CREATININE 1.23 03/16/2023   BUN 15 03/16/2023   NA 135 03/16/2023   K 4.1 03/16/2023   CL 97 03/16/2023   CO2 23 03/16/2023    Lab Results  Component Value Date   ALT 10 03/16/2023   AST 16 03/16/2023   ALKPHOS 130 (H) 03/16/2023   BILITOT 0.6 03/16/2023  Lab Results  Component Value Date   CHOL 111 03/16/2023   HDL 23 (L) 03/16/2023   LDLCALC 45 03/16/2023   LDLDIRECT 29 04/12/2020   TRIG 276 (H) 03/16/2023   CHOLHDL 4.8 03/16/2023   Lab Results  Component Value Date   LABRPR Non Reactive 05/09/2015   No results found for: "HIV1RNAQUANT", "HIV1RNAVL", "CD4TABS"   Problem List Items Addressed This Visit   None  Results   Assessment/Plan   Orlie Bjornstad, MD [facility] 02/02/2024, 9:48 AM

## 2024-02-15 ENCOUNTER — Ambulatory Visit: Admitting: Dermatology

## 2024-04-27 ENCOUNTER — Encounter: Payer: Self-pay | Admitting: Internal Medicine

## 2024-04-27 ENCOUNTER — Other Ambulatory Visit: Payer: Self-pay

## 2024-04-27 ENCOUNTER — Ambulatory Visit (INDEPENDENT_AMBULATORY_CARE_PROVIDER_SITE_OTHER): Admitting: Internal Medicine

## 2024-04-27 VITALS — BP 165/99 | HR 80 | Temp 97.7°F | Ht 77.0 in | Wt 352.0 lb

## 2024-04-27 DIAGNOSIS — L989 Disorder of the skin and subcutaneous tissue, unspecified: Secondary | ICD-10-CM | POA: Diagnosis present

## 2024-04-27 NOTE — Patient Instructions (Signed)
[phone] 

## 2024-04-27 NOTE — Progress Notes (Signed)
 Patient Active Problem List   Diagnosis Date Noted   Left lower quadrant abdominal pain 03/23/2023   Psychophysiological insomnia 03/23/2023   Vitamin D  deficiency 03/23/2023   Nocturia 01/20/2023   Chronic rhinitis 12/25/2021   Lymphedema of lower extremity 09/29/2021   Insertional Achilles tendinopathy 08/07/2021   Chronic obstructive pulmonary disease (HCC) 01/14/2021   Hypertriglyceridemia 04/12/2020   Ulcer of both feet with fat layer exposed (HCC)    Recurrent infections 03/07/2019   Bilateral lower extremity edema 04/07/2018   Stasis dermatitis of both legs 04/07/2018   Low libido 02/09/2018   OSA on CPAP 02/03/2018   Chronic pansinusitis 12/07/2017   Hypertrophy, nasal, turbinate 12/07/2017   Deviated septum 11/11/2017   Umbilical hernia without obstruction and without gangrene 11/11/2017   Lumbar degenerative disc disease 08/02/2017   Chronic folliculitis 04/10/2017   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   Major depression 12/26/2012   Generalized anxiety disorder 12/26/2012   Opiate dependence (HCC) 12/24/2012   Benzodiazepine dependence (HCC) 12/24/2012   HTN (hypertension) 12/24/2012   Morbid obesity (HCC) 09/09/2010   Nicotine  dependence with current use 09/09/2010    Patient's Medications  New Prescriptions   No medications on file  Previous Medications   ACETAMINOPHEN  (TYLENOL  ARTHRITIS PAIN PO)    Take 600 mg by mouth 3 (three) times daily.   DESVENLAFAXINE  (PRISTIQ ) 50 MG 24 HR TABLET    Take 1 tablet (50 mg total) by mouth daily.   DOXYCYCLINE  (VIBRA -TABS) 100 MG TABLET    Take 1 tablet (100 mg total) by mouth 2 (two) times daily.   FUROSEMIDE  (LASIX ) 20 MG TABLET    Take 20 mg by mouth daily.   MUPIROCIN  OINTMENT (BACTROBAN ) 2 %    Apply 1 Application topically daily.   OXYCODONE -ACETAMINOPHEN  (PERCOCET) 10-325 MG TABLET    Take 1 tablet by mouth every 6 (six) hours as needed for pain.   QUETIAPINE  (SEROQUEL ) 25 MG TABLET     TAKE 1 TABLET BY MOUTH EVERY NIGHT AT BEDTIME  Modified Medications   No medications on file  Discontinued Medications   No medications on file    Subjective: 53 year old male with past medical history of GERD, DVT upper extremity, MRSA skin soft tissue infection, pleuritis, PE presents for management of skin lesions.  He was last seen by Dr. Vandyne on 3//7/23 for recurrent skin pathology in part thought to be MRSA.  At that point he had told the provider that patient been suffering from this since 2000.  He attributed problem to a prolonged emphasis that developed.  Got a brace on his leg, buttocks where he had pilonidal cyst.  He had taken doxycycline  for flare of blepharitis.  Tried decolonization with Hibiclens .  When he was first seen he was on several months of Doxy which he thinks he had it helped.  He had been receiving Bactrim  prescription as well in the past but not from Dr. Lindia.  Per note it was noted that did not think it was MRSA or driving his pathology due to the intense itching and scratching that is causing the pathology.  He was referred to wound care.  Given 1 month of doxycycline .  He was seen by [facility] in consultation for recurrent skin infection.  In referred back to infectious disease.  He was having skin infection at the gastric region.  No fluctuance.  He thought it was improving on antibiotics. 02/02/24 : Discussed the use of AI  scribe software for clinical note transcription with the patient, who gave verbal consent to proceed. He has been experiencing recurrent boils that begin as infected hairs and develop into large, painful masses. These lesions have been occurring for several years, with the first noted around 2000. He describes the lesions as 'massive hard balls inside' and notes significant pain, especially when touched.   He has been treated with antibiotics, including doxycycline  and Bactrim , but these treatments seem to exacerbate the condition rather  than improve it. He has not been on antibiotics for a few months and experiences fewer infections without them.   The lesions primarily occur on his back, arms, and chest, with visible scarring from previous outbreaks. He has had some lesions surgically removed, but they were not biopsied.   No fevers or chills associated with these lesions, but he reports significant pain. He has a family history of infections, as his mother had a severe infection related to her diabetes. Review of Systems: ROS  Past Medical History:  Diagnosis Date   Anxiety    Chronic back pain    Degenerative disk disease    Depression    DVT of upper extremity (deep vein thrombosis) (HCC)    GERD (gastroesophageal reflux disease)    Hypertension    MRSA (methicillin resistant staph aureus) culture positive    PAD (peripheral artery disease) (HCC)    Peripheral arterial disease (HCC)    Peripheral neuropathy    Pilonidal cyst    Pruritus 12/23/2021   Pulmonary embolus (HCC)    no blood thinner 2002   Sleep apnea    can't use cpap due to deviated nasal septumg    Social History   Tobacco Use   Smoking status: Every Day    Current packs/day: 1.00    Average packs/day: 1 pack/day for 33.0 years (33.0 ttl pk-yrs)    Types: Cigarettes    Passive exposure: Never   Smokeless tobacco: Never   Tobacco comments:    1pack per day 09/22/2022  Vaping Use   Vaping status: Former  Substance Use Topics   Alcohol use: No    Alcohol/week: 0.0 standard drinks of alcohol    Comment: Rarely   Drug use: No    Family History  Problem Relation Age of Onset   Hypertension Mother    Diabetes Mother    Hypertension Father    Heart disease Father    Pancreatic cancer Brother    Cancer Maternal Grandmother        breast, ovarian & colon   Stroke Paternal Grandfather    Neuropathy Neg Hx    Colon cancer Neg Hx    Esophageal cancer Neg Hx    Liver disease Neg Hx     Allergies  Allergen Reactions   Gabapentin   Other (See Comments)   Pollen Extract Cough   Dust Mite Extract Other (See Comments)   Other Other (See Comments)    Ragweed    Pregabalin  Swelling    Health Maintenance  Topic Date Due   COVID-19 Vaccine (1) Never done   Pneumococcal Vaccine 22-59 Years old (1 of 2 - PCV) Never done   Hepatitis B Vaccines (1 of 3 - 19+ 3-dose series) Never done   Zoster Vaccines- Shingrix (1 of 2) Never done   Lung Cancer Screening  10/27/2023   INFLUENZA VACCINE  05/19/2024   Fecal DNA (Cologuard)  04/01/2026   DTaP/Tdap/Td (2 - Td or Tdap) 07/31/2027   Hepatitis C Screening  Completed   HIV Screening  Completed   HPV VACCINES  Aged Out   Meningococcal B Vaccine  Aged Out    Objective:  Vitals:   04/27/24 1006  Weight: (!) 352 lb (159.7 kg)  Height: 6' 5 (1.956 m)   Body mass index is 41.74 kg/m.  Physical Exam Physical Exam   Lab Results Lab Results  Component Value Date   WBC 9.3 03/16/2023   HGB 15.8 03/16/2023   HCT 46.5 03/16/2023   MCV 90 03/16/2023   PLT 187 03/16/2023    Lab Results  Component Value Date   CREATININE 1.23 03/16/2023   BUN 15 03/16/2023   NA 135 03/16/2023   K 4.1 03/16/2023   CL 97 03/16/2023   CO2 23 03/16/2023    Lab Results  Component Value Date   ALT 10 03/16/2023   AST 16 03/16/2023   ALKPHOS 130 (H) 03/16/2023   BILITOT 0.6 03/16/2023    Lab Results  Component Value Date   CHOL 111 03/16/2023   HDL 23 (L) 03/16/2023   LDLCALC 45 03/16/2023   LDLDIRECT 29 04/12/2020   TRIG 276 (H) 03/16/2023   CHOLHDL 4.8 03/16/2023   Lab Results  Component Value Date   LABRPR Non Reactive 05/09/2015   No results found for: HIV1RNAQUANT, HIV1RNAVL, CD4TABS   Problem List Items Addressed This Visit   None  Results   Assessment/Plan #Recurrent skin lesions Recurrent painful skin lesions on back, chest, and arms. Previous antibiotics ineffective. Lesions likely inflammatory. Dermatology evaluation needed for biopsy(last appt  cancelled). Rpeorts doxy made no difference. Chest lesions somewhat regressed(they had spread). -Provided derm appt number -F/U with ID in 6 months (can be canceled if biopsy is not c/w infeciton).     Loney Stank, MD [facility] 04/27/2024, 10:07 AM  I have personally spent 32 minutes involved in face-to-face and non-face-to-face activities for this patient on the day of the visit. Professional time spent includes the following activities: Preparing to see the patient (review of tests), Obtaining and/or reviewing separately obtained history (admission/discharge record), Performing a medically appropriate examination and/or evaluation , Ordering medications/tests/procedures, referring and communicating with other health care professionals, Documenting clinical information in the EMR, Independently interpreting results (not separately reported), Communicating results to the patient/family/caregiver, Counseling and educating the patient/family/caregiver and Care coordination (not separately reported).

## 2024-05-05 ENCOUNTER — Encounter (INDEPENDENT_AMBULATORY_CARE_PROVIDER_SITE_OTHER): Payer: Self-pay

## 2024-05-05 ENCOUNTER — Ambulatory Visit (INDEPENDENT_AMBULATORY_CARE_PROVIDER_SITE_OTHER)

## 2024-05-05 VITALS — BP 144/84 | HR 81 | Temp 97.8°F | Ht 77.0 in | Wt 357.0 lb

## 2024-05-05 DIAGNOSIS — F411 Generalized anxiety disorder: Secondary | ICD-10-CM

## 2024-05-05 DIAGNOSIS — J310 Chronic rhinitis: Secondary | ICD-10-CM

## 2024-05-05 DIAGNOSIS — G4733 Obstructive sleep apnea (adult) (pediatric): Secondary | ICD-10-CM | POA: Diagnosis not present

## 2024-05-05 DIAGNOSIS — F321 Major depressive disorder, single episode, moderate: Secondary | ICD-10-CM

## 2024-05-05 MED ORDER — ZEPBOUND 2.5 MG/0.5ML ~~LOC~~ SOAJ: 2.5000 mg | SUBCUTANEOUS | 2 refills | Status: DC

## 2024-05-05 NOTE — Patient Instructions (Signed)
 It was nice to see you today!  As we discussed in clinic:  -I have sent in the Zepbound  injection to help with weight loss and sleep apnea.  -I have also placed the referral to psychiatry for GeneSight testing -Lastly, I have placed the referral to ENT for evaluation of your sinuses. In the meantime, please work to discontinue Afrin use and replace it with Flonase .   I will see you in 3 months for a follow up/physical, with labs the week before! It was nice to meet you!  If you have any problems before your next visit feel free to message me via MyChart (minor issues or questions) or call the office, otherwise you may reach out to schedule an office visit.  Thank you! Saddie Sacks, PA-C

## 2024-05-05 NOTE — Assessment & Plan Note (Signed)
 PHQ-9 score 12. GAD-7 score 9. Slightly improved from 15 at last visit in September. Patient did not tolerate Pristiq  or Seroquel  due to feeling foggy and drowsy, so he stopped taking them. Patient has also tried and failed Zoloft  and Cymbalta  in the past. Patient not currently on medications and is not open to trying anything else. Patient is open to seeing Psychiatry for GeneSight testing. Referral placed today. Will cont to monitor and coordinate care with psychiatry per their recommendations.

## 2024-05-05 NOTE — Assessment & Plan Note (Signed)
 Advised patient that he should not be using Afrin nightly due to rhinitis medicamentosa as a side effect that is likely causing his symptoms. Recommended that he replace Afrin with flonase . Continue Singulair  daily. Given his history of prior nasal surgery, will place referral to ENT for further evaluation.

## 2024-05-05 NOTE — Progress Notes (Signed)
 Established Patient Office Visit  Subjective   Patient ID: Eduardo [name], male    DOB: 03/19/1971  Age: 53 y.o. MRN: [id number]  Chief Complaint  Patient presents with   Medical Management of Chronic Issues    HPI  Eduardo [name] is a 53 y.o. y/o male who presents to the clinic today for follow up on mood, weight management.   Weight Management: At patient's last visit with [facility] on 06/30/23, he was prescribed Zepbound  2.5 mg weekly to help with weight loss and severe OSA. Patient reports today that he never picked this medication up from the pharmacy as he was concerned about the side effects. Does admit that he needs to lose weight to improve his over all health and help with his sleep apnea as he is not compliant with his CPAP due to the mask making him feel suffocated. He reports that he has tried several different masks and reports that he will not wear a mask to bed.   Mood: Patient reports that he is still depressed, but does feel that his mood is stable. He stopped taking the Pristiq  due to it making him feel like I was in a fog. He also stopped the Seroquel  due to drowsiness as a side effect. Patient reports that the only thing that works for him is Xanax. Patient is open to seeing psychiatry. Denies SI/HI.   Patient also reports that he believes he needs to go see ENT again. Reports that several years ago he had nasal surgery but is not sure what for. Reports that he has been struggling over a year with breathing through his nose. Symptoms worse at night. Reports that he has been using Afrin nightly to help with this. He does not have flonase .    ROS Per HPI.    Objective:     BP (!) 144/84   Pulse 81   Temp 97.8 F (36.6 C) (Oral)   Ht 6' 5 (1.956 m)   Wt (!) 357 lb (161.9 kg)   SpO2 97%   BMI 42.33 kg/m    Physical Exam Constitutional:      General: He is not in acute distress.    Appearance: Normal appearance.  HENT:     Nose: Nose normal.      Comments: No visible polyps on exam Cardiovascular:     Rate and Rhythm: Normal rate and regular rhythm.     Heart sounds: Normal heart sounds. No murmur heard.    No friction rub. No gallop.  Pulmonary:     Effort: Pulmonary effort is normal. No respiratory distress.     Breath sounds: Normal breath sounds.  Musculoskeletal:        General: No swelling.  Skin:    General: Skin is warm and dry.  Neurological:     General: No focal deficit present.     Mental Status: He is alert.  Psychiatric:        Mood and Affect: Mood normal.        Behavior: Behavior normal.        Thought Content: Thought content normal.    No results found for any visits on 05/05/24.    The ASCVD Risk score (Arnett DK, et al., 2019) failed to calculate for the following reasons:   The valid total cholesterol range is 130 to 320 mg/dL    Assessment & Plan:   OSA on CPAP Assessment & Plan: Patient has severe OSA and has been unable to  tolerate CPAP due to claustrophobia. Per pulm, he is not a candidate for hypoglossal nerve stimulator implant due to high BMI. BiPAP trial failed. Another reason we will trial Zepbound  to help with weight loss and consequently improve OSA.  Orders: -     Zepbound ; Inject 2.5 mg into the skin once a week.  Dispense: 2 mL; Refill: 2  Morbid obesity (HCC) Assessment & Plan: Current weight 357 pounds, BMI 42.33. Patient was previously 341 lbs at last office visit (+16 lbs). Reassured patient that if he experienced side effects from the Zepbound , we could treat them with Zofran /Mirilax as needed and he could stop doing the injections if he could not tolerate the medication. Weight loss would be beneficial for patient's overall health including hypertension, OSA, hypertriglyceridemia. Start Zepbound  2.5 mg weekly injection for 1 month. At that time, may continue at lowest dose or start titrating up. Discussed mechanism of action and potential side effects, provided demonstration  with demo pen for how to inject/wear to inject, patient verbalized understanding and is agreeable with this plan.   Orders: -     Zepbound ; Inject 2.5 mg into the skin once a week.  Dispense: 2 mL; Refill: 2  Chronic rhinitis Assessment & Plan: Advised patient that he should not be using Afrin nightly due to rhinitis medicamentosa as a side effect that is likely causing his symptoms. Recommended that he replace Afrin with flonase . Continue Singulair  daily. Given his history of prior nasal surgery, will place referral to ENT for further evaluation.  Orders: -     Ambulatory referral to ENT  Generalized anxiety disorder Assessment & Plan: PHQ-9 score 12. GAD-7 score 9. Slightly improved from 15 at last visit in September. Patient did not tolerate Pristiq  or Seroquel  due to feeling foggy and drowsy, so he stopped taking them. Patient has also tried and failed Zoloft  and Cymbalta  in the past. Patient not currently on medications and is not open to trying anything else. Patient is open to seeing Psychiatry for GeneSight testing. Referral placed today. Will cont to monitor and coordinate care with psychiatry per their recommendations.  Orders: -     Ambulatory referral to Psychiatry  Current moderate episode of major depressive disorder without prior episode (HCC) Assessment & Plan: PHQ-9 score 12. GAD-7 score 9. Slightly improved from 15 at last visit in September. Patient did not tolerate Pristiq  or Seroquel  due to feeling foggy and drowsy, so he stopped taking them. Patient has also tried and failed Zoloft  and Cymbalta  in the past. Patient not currently on medications and is not open to trying anything else. Patient is open to seeing Psychiatry for GeneSight testing. Referral placed today. Will cont to monitor and coordinate care with psychiatry per their recommendations.   Orders: -     Ambulatory referral to Psychiatry    Return in about 3 months (around 08/05/2024) for Physical,  Weight.    Saddie JULIANNA Sacks, PA-C

## 2024-05-05 NOTE — Assessment & Plan Note (Signed)
 Patient has severe OSA and has been unable to tolerate CPAP due to claustrophobia. Per pulm, he is not a candidate for hypoglossal nerve stimulator implant due to high BMI. BiPAP trial failed. Another reason we will trial Zepbound  to help with weight loss and consequently improve OSA.

## 2024-05-05 NOTE — Assessment & Plan Note (Signed)
 Current weight 357 pounds, BMI 42.33. Patient was previously 341 lbs at last office visit (+16 lbs). Reassured patient that if he experienced side effects from the Zepbound , we could treat them with Zofran /Mirilax as needed and he could stop doing the injections if he could not tolerate the medication. Weight loss would be beneficial for patient's overall health including hypertension, OSA, hypertriglyceridemia. Start Zepbound  2.5 mg weekly injection for 1 month. At that time, may continue at lowest dose or start titrating up. Discussed mechanism of action and potential side effects, provided demonstration with demo pen for how to inject/wear to inject, patient verbalized understanding and is agreeable with this plan.

## 2024-06-21 ENCOUNTER — Emergency Department (HOSPITAL_COMMUNITY)
Admission: EM | Admit: 2024-06-21 | Discharge: 2024-06-21 | Disposition: A | Discharge status: Home / Self Care | Attending: Emergency Medicine | Admitting: Emergency Medicine

## 2024-06-21 ENCOUNTER — Ambulatory Visit: Payer: Self-pay

## 2024-06-21 DIAGNOSIS — I10 Essential (primary) hypertension: Secondary | ICD-10-CM | POA: Diagnosis not present

## 2024-06-21 DIAGNOSIS — L739 Follicular disorder, unspecified: Secondary | ICD-10-CM | POA: Insufficient documentation

## 2024-06-21 LAB — CBC WITH DIFFERENTIAL/PLATELET
Abs Immature Granulocytes: 0.07 K/uL (ref 0.00–0.07)
Basophils Absolute: 0.1 K/uL (ref 0.0–0.1)
Basophils Relative: 1 %
Eosinophils Absolute: 0.4 K/uL (ref 0.0–0.5)
Eosinophils Relative: 4 %
HCT: 41 % (ref 39.0–52.0)
Hemoglobin: 14.3 g/dL (ref 13.0–17.0)
Immature Granulocytes: 1 %
Lymphocytes Relative: 23 %
Lymphs Abs: 2.4 K/uL (ref 0.7–4.0)
MCH: 31.3 pg (ref 26.0–34.0)
MCHC: 34.9 g/dL (ref 30.0–36.0)
MCV: 89.7 fL (ref 80.0–100.0)
Monocytes Absolute: 0.6 K/uL (ref 0.1–1.0)
Monocytes Relative: 6 %
Neutro Abs: 6.9 K/uL (ref 1.7–7.7)
Neutrophils Relative %: 65 %
Platelets: 161 K/uL (ref 150–400)
RBC: 4.57 MIL/uL (ref 4.22–5.81)
RDW: 14.4 % (ref 11.5–15.5)
WBC: 10.4 K/uL (ref 4.0–10.5)
nRBC: 0 % (ref 0.0–0.2)

## 2024-06-21 LAB — COMPREHENSIVE METABOLIC PANEL WITH GFR
ALT: 9 U/L (ref 0–44)
AST: 26 U/L (ref 15–41)
Albumin: 4.3 g/dL (ref 3.5–5.0)
Alkaline Phosphatase: 141 U/L — ABNORMAL HIGH (ref 38–126)
Anion gap: 12 (ref 5–15)
BUN: 11 mg/dL (ref 6–20)
CO2: 26 mmol/L (ref 22–32)
Calcium: 9.3 mg/dL (ref 8.9–10.3)
Chloride: 101 mmol/L (ref 98–111)
Creatinine, Ser: 1.05 mg/dL (ref 0.61–1.24)
GFR, Estimated: >60 mL/min (ref >60)
Glucose, Bld: 114 mg/dL — ABNORMAL HIGH (ref 70–99)
Potassium: 3.6 mmol/L (ref 3.5–5.1)
Sodium: 139 mmol/L (ref 135–145)
Total Bilirubin: 0.8 mg/dL (ref 0.0–1.2)
Total Protein: 8.3 g/dL — ABNORMAL HIGH (ref 6.5–8.1)

## 2024-06-21 MED ORDER — DOXYCYCLINE HYCLATE 100 MG PO CAPS: 100.0000 mg | ORAL_CAPSULE | Freq: Two times a day (BID) | ORAL | 0 refills | Status: DC

## 2024-06-21 NOTE — ED Provider Notes (Signed)
 [place] EMERGENCY DEPARTMENT AT [facility] Provider Note   CSN: [id number] Arrival date & time: 06/21/24  1507     Patient presents with: No chief complaint on file.   Eduardo [name] is a 53 y.o. male.   53 year old male with prior medical history as detailed below presents for evaluation.  He complains of infection all over his body.  He reports multiple areas of boils and folliculitis.  He reports that these are on his buttocks, posterior thighs, back.  He has a chronic wound to the bottom of his left foot.  He denies fever.  He reports in the past he has had to be on antibiotics and occasionally had to have an I&D.  He has previously seen the wound clinic.  He is not currently on antibiotics.  Prior medical history includes chronic back pain, PE, DVT, GERD, peripheral artery disease, hypertension.  The history is provided by the patient and medical records.       Prior to Admission medications   Medication Sig Start Date End Date Taking? Authorizing Provider  Acetaminophen  (TYLENOL  ARTHRITIS PAIN PO) Take 600 mg by mouth 3 (three) times daily.    [provider]  oxyCODONE -acetaminophen  (PERCOCET) 10-325 MG tablet Take 1 tablet by mouth every 6 (six) hours as needed for pain.    [provider]  tirzepatide  (ZEPBOUND ) 2.5 MG/0.5ML Pen Inject 2.5 mg into the skin once a week. 05/05/24   Gayle Saddie FALCON, PA-C    Allergies: Gabapentin , Pollen extract, Dust mite extract, Other, and Pregabalin     Review of Systems  All other systems reviewed and are negative.   Updated Vital Signs BP (!) 166/96   Pulse 80   Temp 98.4 F (36.9 C)   Resp 20   SpO2 95%   Physical Exam Vitals and nursing note reviewed.  Constitutional:      General: He is not in acute distress.    Appearance: Normal appearance. He is well-developed.  HENT:     Head: Normocephalic and atraumatic.  Eyes:     Conjunctiva/sclera: Conjunctivae normal.     Pupils: Pupils  are equal, round, and reactive to light.  Cardiovascular:     Rate and Rhythm: Normal rate and regular rhythm.     Heart sounds: Normal heart sounds.  Pulmonary:     Effort: Pulmonary effort is normal. No respiratory distress.     Breath sounds: Normal breath sounds.  Abdominal:     General: There is no distension.     Palpations: Abdomen is soft.     Tenderness: There is no abdominal tenderness.  Musculoskeletal:        General: No deformity. Normal range of motion.     Cervical back: Normal range of motion and neck supple.  Skin:    General: Skin is warm and dry.     Comments: Multiple areas across the patient's back, buttocks, thighs of folliculitis in various stages of healing.  Chronic wound to the sole of the left foot noted.  See images below.  No particular lesion appears to need I&D at this time.  Neurological:     General: No focal deficit present.     Mental Status: He is alert and oriented to person, place, and time.          (all labs ordered are listed, but only abnormal results are displayed) Labs Reviewed  COMPREHENSIVE METABOLIC PANEL WITH GFR - Abnormal; Notable for the following components:  Result Value   Glucose, Bld 114 (*)    Total Protein 8.3 (*)    Alkaline Phosphatase 141 (*)    All other components within normal limits  CBC WITH DIFFERENTIAL/PLATELET    EKG: None  Radiology: No results found.   Procedures   Medications Ordered in the ED - No data to display                                  Medical Decision Making Patient is nontoxic in appearance.  He complains of multiple areas of folliculitis.  He would benefit from a course of antibiotics.  He has chronic wound to the sole of his left foot.  His labs are without significant acute abnormality.  Patient has previously seen the wound care clinic.  He plans on following up with them regarding this particular issue today.  Prescription for doxycycline  given.  Importance of  close follow-up stressed.  Strict return precautions given and understood.        Final diagnoses:  Folliculitis    ED Discharge Orders          Ordered    doxycycline  (VIBRAMYCIN ) 100 MG capsule  2 times daily        06/21/24 1657               Laurice Maude BROCKS, MD 06/21/24 1659

## 2024-06-21 NOTE — Telephone Encounter (Signed)
 FYI Only or Action Required?: Action required by provider: Heading to ED today, pt requesting PCP call hospital ahead of time if possible, understands no guarantee.  Patient was last seen in primary care on 12/28/2023 by Billy Knee, FNP.  Called Nurse Triage reporting Wound Infection, black in wound, rapidly growing wound, Chills, and redness.  Symptoms began several days ago.  Interventions attempted: Other: hx of seeing Infectious Disease.  Symptoms are: rapidly worsening.  Triage Disposition: Go to ED Now (Notify PCP)  Patient/caregiver understands and will follow disposition?: Yes       Copied from CRM [id number]. Topic: Clinical - Red Word Triage >> Jun 21, 2024 10:08 AM Gustabo D wrote: Infection on back of leg and foot it got worse yesterday left leg   Reason for Disposition  SEVERE pain in the wound  Answer Assessment - Initial Assessment Questions 1. LOCATION: Where is the wound located?      Back side of leg just under my butt right where meets cheek and leg Other places on feet 2. WOUND APPEARANCE: What does the wound look like?      Can't see the one on back of leg, foot one kind of reddish black needs to be scrubbed out 3. SIZE: If redness is present, ask: What is the size of the red area? (Inches, centimeters, or compare to size of a coin)      Was size of dime the other day now size of quarter overnight 4. SPREAD: What's changed in the last day?  Do you see any red streaks coming from the wound?     Changing size overnight, not on foot, don't know on leg Definitely moving to inside of leg, feels like swelling up 5. ONSET: When did it start to look infected?      Within the last week 6. MECHANISM: How did the wound start, what was the cause?     started as little bump like normally does, get big so quick, hx MRSA 7. PAIN: Do you have any pain?  If Yes, ask: How bad is the pain?  (e.g., Scale 1-10; mild, moderate, or severe)     12/10 8.  FEVER: Do you have a fever? If Yes, ask: What is your temperature, how was it measured, and when did it start?     no 9. OTHER SYMPTOMS: Do you have any other symptoms? (e.g., shaking chills, weakness, rash elsewhere on body)     No weakness chills last night Several places all over Been to infectious disease, nothing they can do  Bright red around it, not that dark   May be later on this evening    Advised pt go to ED right away and have another adult drive, pt stating that everyone at work so may not be until later but will go asap. Advised go to ED asap if any worsening, call 911 if weakness or other severity. Sending message to office per pt request to see if PCP could call ahead for appt/admission at hospital so don't have to wait.  Protocols used: Wound Infection Suspected-A-AH

## 2024-06-21 NOTE — ED Triage Notes (Signed)
 Patient complains of infection in multiple areas of his body. Patient complains of pain in his foot, multiple bumps on his back and boils to his buttocks. Patient has an open wound with callus to ball of his left foot. Multiple bumps that are red and inflamed on his back. Has had to have boils on his back and buttocks cut open and drained. Patient rates pain 6/10. Denies any antibiotic uses at this time. States this has been going on for about one week and getting worse.

## 2024-06-21 NOTE — Discharge Instructions (Addendum)
 Return for any problem.  Take all of the antibiotic prescribed as instructed.  Your labs obtained today are without demonstration of significant acute problem.  Follow-up with your regular primary care provider and/or the wound clinic for further treatment of your skin lesions.  If you develop fever, increasing pain, other issue please return to the ED for evaluation.

## 2024-06-28 ENCOUNTER — Ambulatory Visit (INDEPENDENT_AMBULATORY_CARE_PROVIDER_SITE_OTHER)

## 2024-06-28 VITALS — BP 150/77 | HR 87 | Wt 347.0 lb

## 2024-06-28 DIAGNOSIS — I10 Essential (primary) hypertension: Secondary | ICD-10-CM

## 2024-06-28 DIAGNOSIS — L0291 Cutaneous abscess, unspecified: Secondary | ICD-10-CM

## 2024-06-28 DIAGNOSIS — L739 Follicular disorder, unspecified: Secondary | ICD-10-CM | POA: Diagnosis not present

## 2024-06-28 DIAGNOSIS — G609 Hereditary and idiopathic neuropathy, unspecified: Secondary | ICD-10-CM

## 2024-06-28 MED ORDER — SULFAMETHOXAZOLE-TRIMETHOPRIM 800-160 MG PO TABS: 1.0000 | ORAL_TABLET | Freq: Two times a day (BID) | ORAL | 0 refills | Status: AC

## 2024-06-28 MED ORDER — MUPIROCIN 2 % EX OINT: 1.0000 | TOPICAL_OINTMENT | Freq: Every day | CUTANEOUS | 0 refills | Status: AC

## 2024-06-28 NOTE — Assessment & Plan Note (Signed)
 Peripheral neuropathy managed with oxycodone  prescribed by pain management.

## 2024-06-28 NOTE — Progress Notes (Signed)
 Established Patient Office Visit  Subjective   Patient ID: Eduardo [name], male    DOB: Sep 21, 1971  Age: 53 y.o. MRN: [id number]  Chief Complaint  Patient presents with   Follow-up    Patient in room #4 and alone. Pt states he has some concerns about an infection that might be MRSA and he has an Large, abscess-like boils or cysts on the bottom of his left foot.    HPI   History of Present Illness   Eduardo [name] is a 53 year old male who presents for hospital follow up  with multiple skin lesions and wounds requiring evaluation and management.  Cutaneous lesions and wounds - Visited emergency department on June 21, 2024, and was prescribed doxycycline  twice daily - Feels current antibiotics are less effective than previous courses - Multiple skin lesions and wounds present on left foot, back, and leg - Some lesions developed suddenly, others are pre-existing and have become infected - Left foot lesion has been wrapped since last month and still requires ongoing care - Lesions on the back are healing well and are not painful - No significant pain from any of the lesions - No fevers reported - Sister assists with wound packing due to inability to reach wounds  Neuropathy and sensory deficits - No sensation in feet, complicating wound management - History of neuropathy - Currently taking oxycodone  for neuropathic pain, prescribed by pain management - Previously tried pain patches but prefers oral medication for better control        ROS Per HPI.    Objective:     BP (!) 150/77   Pulse 87   Wt (!) 347 lb (157.4 kg)   SpO2 98%   BMI 41.15 kg/m    Physical Exam Constitutional:      General: He is not in acute distress.    Appearance: Normal appearance.  Cardiovascular:     Rate and Rhythm: Normal rate and regular rhythm.     Heart sounds: Normal heart sounds. No murmur heard.    No friction rub. No gallop.  Pulmonary:     Effort: Pulmonary  effort is normal. No respiratory distress.     Breath sounds: Normal breath sounds.  Musculoskeletal:        General: No swelling.  Skin:    General: Skin is warm and dry.     Comments: Boils/foot wound present (see media for updated photos)  Neurological:     General: No focal deficit present.     Mental Status: He is alert.  Psychiatric:        Mood and Affect: Mood normal.        Behavior: Behavior normal.        Thought Content: Thought content normal.    No results found for any visits on 06/28/24.  Last CBC Lab Results  Component Value Date   WBC 10.4 06/21/2024   HGB 14.3 06/21/2024   HCT 41.0 06/21/2024   MCV 89.7 06/21/2024   MCH 31.3 06/21/2024   RDW 14.4 06/21/2024   PLT 161 06/21/2024   Last metabolic panel Lab Results  Component Value Date   GLUCOSE 114 (H) 06/21/2024   NA 139 06/21/2024   K 3.6 06/21/2024   CL 101 06/21/2024   CO2 26 06/21/2024   BUN 11 06/21/2024   CREATININE 1.05 06/21/2024   GFRNONAA >60 06/21/2024   CALCIUM  9.3 06/21/2024   PROT 8.3 (H) 06/21/2024   ALBUMIN 4.3 06/21/2024   LABGLOB  2.9 03/16/2023   AGRATIO 1.5 03/16/2023   BILITOT 0.8 06/21/2024   ALKPHOS 141 (H) 06/21/2024   AST 26 06/21/2024   ALT 9 06/21/2024   ANIONGAP 12 06/21/2024      The ASCVD Risk score (Arnett DK, et al., 2019) failed to calculate for the following reasons:   The valid total cholesterol range is 130 to 320 mg/dL    Assessment & Plan:   Abscess -     AMB referral to wound care center -     Ambulatory referral to Home Health  Chronic folliculitis Assessment & Plan: Chronic wounds on left foot and leg. Leg wound concerning for leakage and potential infection. Foot wound improving but requires offloading. Previous doxycycline  not helping with persistent purulent drainage of left leg wound. No systemic infection indicated by normal WBC and absence of fever. - Discontinue doxycycline . - Initiate Bactrim  twice daily with food x 7 days.  - Refer  to wound care for further management - Advise offloading of the foot to reduce pressure. - Provide a work note to minimize weight-bearing on the foot. - Ensure wound care provides necessary supplies and consider home health for wound packing.  Orders: -     Ambulatory referral to Home Health  Chronic idiopathic axonal polyneuropathy Assessment & Plan: Peripheral neuropathy managed with oxycodone  prescribed by pain management.    Hypertension, unspecified type Assessment & Plan: BP Goal <130/80. Above goal in office and on recheck today. Patient attributes elevated readings to pain.  Counseled on medication compliance and advised to continue checking BP at home. If consistently running high at next visit, patient needs to be started on Losartan for tighter BP control.   Other orders -     Sulfamethoxazole -Trimethoprim ; Take 1 tablet by mouth 2 (two) times daily for 7 days.  Dispense: 14 tablet; Refill: 0 -     Mupirocin ; Apply 1 Application topically daily. Apply to closed wounds on the back  Dispense: 22 g; Refill: 0   Return if symptoms worsen or fail to improve.    Saddie JULIANNA Sacks, PA-C

## 2024-06-28 NOTE — Assessment & Plan Note (Signed)
 Chronic wounds on left foot and leg. Leg wound concerning for leakage and potential infection. Foot wound improving but requires offloading. Previous doxycycline  not helping with persistent purulent drainage of left leg wound. No systemic infection indicated by normal WBC and absence of fever. - Discontinue doxycycline . - Initiate Bactrim  twice daily with food x 7 days.  - Refer to wound care for further management - Advise offloading of the foot to reduce pressure. - Provide a work note to minimize weight-bearing on the foot. - Ensure wound care provides necessary supplies and consider home health for wound packing.

## 2024-06-28 NOTE — Patient Instructions (Signed)
 VISIT SUMMARY: Today, you were seen for multiple skin lesions and wounds on your left foot, back, and leg. We discussed your current treatment, wound care, and pain management. Your overall metabolic and organ function status is stable.  YOUR PLAN: -CHRONIC NON-HEALING WOUNDS OF LEFT FOOT AND LEFT LEG: Chronic wounds are sores that do not heal properly over time. We will stop your current antibiotic, doxycycline , and start you on Bactrim  twice daily with food. You should continue to offload your foot to reduce pressure and minimize weight-bearing. We will refer you to wound care urgently and ensure you have the necessary supplies. Home health services may assist with wound packing.  -HEALING CUTANEOUS ABSCESSES OF THE BACK: Cutaneous abscesses are collections of pus under the skin. The abscesses on your back are healing well. We will prescribe mupirocin , a topical antibiotic, to help with the healing process.  -PERIPHERAL NEUROPATHY: Peripheral neuropathy is a condition where you have nerve damage, often causing numbness and pain. You are currently managing this with oxycodone  as prescribed by your pain management specialist.  INSTRUCTIONS: Please follow up with wound care urgently as referred. Continue taking Bactrim  twice daily with food and use mupirocin  on the abscesses on your back. Minimize weight-bearing on your left foot and use offloading measures to reduce pressure. If you need assistance with wound packing, consider home health services.  If you have any problems before your next visit feel free to message me via MyChart (minor issues or questions) or call the office, otherwise you may reach out to schedule an office visit.  Thank you! Saddie Sacks, PA-C

## 2024-06-28 NOTE — Assessment & Plan Note (Addendum)
 BP Goal <130/80. Above goal in office and on recheck today. Patient attributes elevated readings to pain.  Counseled on medication compliance and advised to continue checking BP at home. If consistently running high at next visit, patient needs to be started on Losartan for tighter BP control.

## 2024-06-30 ENCOUNTER — Ambulatory Visit (HOSPITAL_COMMUNITY): Admitting: Physician Assistant

## 2024-07-04 ENCOUNTER — Encounter (HOSPITAL_BASED_OUTPATIENT_CLINIC_OR_DEPARTMENT_OTHER): Attending: Internal Medicine | Admitting: Internal Medicine

## 2024-07-04 ENCOUNTER — Inpatient Hospital Stay

## 2024-07-04 DIAGNOSIS — Z6841 Body Mass Index (BMI) 40.0 and over, adult: Secondary | ICD-10-CM | POA: Diagnosis not present

## 2024-07-04 DIAGNOSIS — L97522 Non-pressure chronic ulcer of other part of left foot with fat layer exposed: Secondary | ICD-10-CM | POA: Diagnosis present

## 2024-07-04 DIAGNOSIS — G9009 Other idiopathic peripheral autonomic neuropathy: Secondary | ICD-10-CM | POA: Diagnosis not present

## 2024-07-04 DIAGNOSIS — S31819A Unspecified open wound of right buttock, initial encounter: Secondary | ICD-10-CM | POA: Diagnosis not present

## 2024-07-04 DIAGNOSIS — L97822 Non-pressure chronic ulcer of other part of left lower leg with fat layer exposed: Secondary | ICD-10-CM | POA: Diagnosis not present

## 2024-07-04 DIAGNOSIS — F17210 Nicotine dependence, cigarettes, uncomplicated: Secondary | ICD-10-CM | POA: Diagnosis not present

## 2024-07-04 DIAGNOSIS — X58XXXA Exposure to other specified factors, initial encounter: Secondary | ICD-10-CM | POA: Insufficient documentation

## 2024-07-04 DIAGNOSIS — L02416 Cutaneous abscess of left lower limb: Secondary | ICD-10-CM | POA: Insufficient documentation

## 2024-07-13 ENCOUNTER — Encounter (HOSPITAL_BASED_OUTPATIENT_CLINIC_OR_DEPARTMENT_OTHER): Admitting: General Surgery

## 2024-07-13 DIAGNOSIS — L97522 Non-pressure chronic ulcer of other part of left foot with fat layer exposed: Secondary | ICD-10-CM | POA: Diagnosis not present

## 2024-07-17 ENCOUNTER — Encounter (INDEPENDENT_AMBULATORY_CARE_PROVIDER_SITE_OTHER): Payer: Self-pay | Admitting: Otolaryngology

## 2024-07-17 ENCOUNTER — Ambulatory Visit (INDEPENDENT_AMBULATORY_CARE_PROVIDER_SITE_OTHER): Admitting: Otolaryngology

## 2024-07-17 VITALS — BP 166/84 | HR 76 | Ht 77.0 in | Wt 360.0 lb

## 2024-07-17 DIAGNOSIS — T485X5A Adverse effect of other anti-common-cold drugs, initial encounter: Secondary | ICD-10-CM

## 2024-07-17 DIAGNOSIS — J310 Chronic rhinitis: Secondary | ICD-10-CM | POA: Diagnosis not present

## 2024-07-17 DIAGNOSIS — F17210 Nicotine dependence, cigarettes, uncomplicated: Secondary | ICD-10-CM

## 2024-07-17 DIAGNOSIS — R0981 Nasal congestion: Secondary | ICD-10-CM

## 2024-07-17 DIAGNOSIS — J3489 Other specified disorders of nose and nasal sinuses: Secondary | ICD-10-CM | POA: Diagnosis not present

## 2024-07-17 DIAGNOSIS — Z9889 Other specified postprocedural states: Secondary | ICD-10-CM

## 2024-07-17 DIAGNOSIS — J328 Other chronic sinusitis: Secondary | ICD-10-CM

## 2024-07-17 DIAGNOSIS — F17200 Nicotine dependence, unspecified, uncomplicated: Secondary | ICD-10-CM

## 2024-07-17 MED ORDER — PREDNISONE 10 MG PO TABS: ORAL_TABLET | ORAL | 0 refills | Status: AC

## 2024-07-17 MED ORDER — AZELASTINE HCL 0.1 % NA SOLN: 2.0000 | Freq: Two times a day (BID) | NASAL | 12 refills | Status: AC

## 2024-07-17 MED ORDER — FLONASE SENSIMIST 27.5 MCG/SPRAY NA SUSP: 2.0000 | Freq: Two times a day (BID) | NASAL | 12 refills | Status: DC

## 2024-07-17 NOTE — Patient Instructions (Addendum)
 Use two sprays of flonase  in each nostril twice per day  right after, use astelin  spray two sprays each nostril twice per day  Stop afrin best you can  Aureliano Med Nasal Saline Rinse - use daily - start nasal saline rinses with NeilMed Bottle available over the counter    Nasal Saline Irrigation instructions: If you choose to make your own salt water solution, You will need: Salt (kosher, canning, or pickling salt) Baking soda Nasal irrigation bottle (i.e. Aureliano Med Sinus Rinse) Measuring spoon ( teaspoon) Distilled / boiled water   Mix solution Mix 1/2 teaspoon of salt, 1/4 teaspoon of baking soda and 1 cup of water into irrigation bottle ** May use saline packet instead of homemade recipe for this step if you prefer If medicine was prescribed to be mixed with solution, place this into bottle Examples 2 inches of 2% mupirocin  ointment Budesonide  solution Position your head: Lean over sink (about 45 degrees) Rotate head (about 45 degrees) so that one nostril is above the other Irrigate Insert tip of irrigation bottle into upper nostril so it forms a comfortable seal Irrigate while breathing through your mouth May remove the straw from the bottle in order to irrigate the entire solution (important if medicine was added) Exhale through nose when finished and blow nose as necessary  Repeat on opposite side with other 1/2 of solution (120 mL) or remake solution if all 240 mL was used on first side Wash irrigation bottle regularly, replace every 3 months

## 2024-07-17 NOTE — Progress Notes (Signed)
 Dear Dr. Gayle, Here is my assessment for our mutual patient, Eduardo [name] Thank you for allowing me the opportunity to care for your patient. Please do not hesitate to contact me should you have any other questions. Sincerely, Dr. Eldora Blanch  Otolaryngology Clinic Note  HISTORY: [name] is a 53 y.o. male kindly referred by Dr. Gayle for evaluation of nasal congestion and obstruction  Initial visit (06/2024): He reports that that he has had a long-standing history of nasal congestion and obstruction, and underwent b/l FESS and septoplasty with Dr. Roark in 2019. He reports that this did not help. His main complaint is congestion which occurs all the time which is worst when he lays down. Denies history of frequent sinus infections requiring antibiotics. No facial pressure/pain. Sense of smell has declined when congested.   He is currently using afrin and claritin. He has used flonase  some in the past but reports it did not help. He was doing saline flushes and it was making him swell (the salt in the rinses) so he stopped. He is using afrin several times per day. .   Nothing besides afrin helps. Allergy testing has been done - ragweed, pollen. 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 Roark 02/03/2018: Bilateral max, total ethmoid, frontal, septoplasty, and turbinate reduction due to chronic nasal obstruction.  Labs CBC and CMP 06/21/2024: WBC 10.4, Eos 500 (prior); BUN/Cr 11/1.05 CT Sinus  12/28/2017:  Past Medical History:  Diagnosis Date   Anxiety    Chronic back pain    Degenerative disk disease    Depression    DVT of upper extremity (deep vein thrombosis) (HCC)    GERD (gastroesophageal reflux disease)    Hypertension    MRSA (methicillin resistant staph aureus) culture positive    PAD (peripheral artery disease)    Peripheral arterial disease    Peripheral neuropathy    Pilonidal cyst    Pruritus 12/23/2021   Pulmonary embolus (HCC)    no blood thinner 2002   Sleep apnea    can't use cpap due to deviated nasal septumg   Past Surgical History:  Procedure Laterality Date   Irrigation and debridement of back abscess     MASS EXCISION Right 06/14/2019   Procedure: EXCISION BENIGN LESION RIGHT FOOT;  Surgeon: Gretel Ozell PARAS, DPM;  Location: [facility];  Service: Podiatry;  Laterality: Right;   NASAL SEPTOPLASTY W/ TURBINOPLASTY [name] 02/03/2018   Procedure: NASAL SEPTOPLASTY WITH TURBINATE REDUCTION;  Surgeon: Roark Rush, MD;  Location: [facility] OR;  Service: ENT;  Laterality: [name];   PILONIDAL CYST DRAINAGE [name] 04/10/2017   Procedure: IRRIGATION AND DEBRIDEMENT BACK ABSCESS;  Surgeon: Sheldon Standing, MD;  Location: [facility];  Service: General;  Laterality: [name];   SINUS ENDO WITH FUSION [name] 02/03/2018   Procedure: ENDOSCOPIC SINUS SURGERY WITH FUSION;  Surgeon: Roark Rush, MD;  Location: [facility] OR;  Service: ENT;  Laterality: [name];   THORACIC OUTLET SURGERY  2000   TOE ARTHROPLASTY Right 06/14/2019   Procedure: HALLUX ARTHROPLASTY  RIGHT FOOT WITH TISSUE TRANSER EXCISION OF SESAMOID BONE;  Surgeon: Gretel Ozell PARAS, DPM;  Location: [facility];  Service: Podiatry;  Laterality: Right;   WRIST FUSION  2002   Family History  Problem Relation Age of Onset   Hypertension Mother    Diabetes Mother    Hypertension Father    Heart disease Father    Pancreatic cancer Brother    Cancer Maternal Grandmother        breast, ovarian & colon   Stroke Paternal Grandfather    Neuropathy Neg Hx     Colon cancer Neg Hx    Esophageal cancer Neg Hx    Liver disease Neg Hx    Social History   Tobacco Use   Smoking status: Every Day    Current packs/day: 1.00    Average packs/day: 1 pack/day for 33.0 years (33.0 ttl pk-yrs)    Types: Cigarettes    Passive exposure: Never   Smokeless tobacco: Never   Tobacco comments:    1pack per day 09/22/2022  Substance Use Topics   Alcohol use: No    Alcohol/week: 0.0 standard drinks of alcohol    Comment: Rarely   Allergies  Allergen Reactions   Gabapentin  Other (See Comments)   Pollen Extract Cough   Dust Mite Extract Other (See Comments)   Other Other (See Comments)    Ragweed    Pregabalin  Swelling   Current Outpatient Medications  Medication Sig Dispense Refill   Acetaminophen  (TYLENOL  ARTHRITIS PAIN PO) Take 600 mg by mouth 3 (three) times daily.     azelastine  (ASTELIN ) 0.1 % nasal spray Place 2 sprays into both nostrils 2 (two) times daily. Use in each nostril as directed 30 mL 12   fluticasone  (FLONASE  SENSIMIST) 27.5 MCG/SPRAY nasal spray Place 2 sprays into the nose in the morning and at bedtime. 10 g 12   mupirocin  ointment (BACTROBAN ) 2 % Apply 1 Application topically daily. Apply to closed wounds on the back 22 g 0   oxyCODONE -acetaminophen  (PERCOCET) 10-325 MG tablet Take 1 tablet by mouth every 6 (six) hours as needed for pain.     tirzepatide  (ZEPBOUND ) 2.5 MG/0.5ML Pen Inject 2.5 mg into the skin once a week. (Patient not taking: Reported on 07/17/2024) 2 mL 2   No current facility-administered medications for this visit.   BP (!) 166/84 (BP Location: Left Arm, Patient Position: Sitting, Cuff Size: Large)   Pulse 76   Ht 6' 5 (1.956 m)   Wt (!) 360 lb (163.3 kg)   SpO2 96%   BMI 42.69 kg/m   PHYSICAL EXAM:  BP (!) 166/84 (BP Location: Left Arm, Patient Position: Sitting, Cuff Size: Large)   Pulse 76   Ht 6' 5 (1.956 m)   Wt (!) 360 lb (163.3 kg)   SpO2 96%   BMI 42.69 kg/m    Salient findings:  CN  II-XII intact  Bilateral EAC clear and TM intact with well pneumatized middle ear spaces Nose: Anterior rhinoscopy reveals septum relatively midline, turbinates modest hypertrophy; mucosal changes suggestive of rhinitis medicamentosa. Nasal endoscopy was indicated to better evaluate the nose and paranasal sinuses, given the patient's history and exam findings, and is detailed below. No lesions of oral cavity/oropharynx No respiratory distress or stridor   PROCEDURE:  Prior to initiating any procedures, risks/benefits/alternatives were explained to the patient and verbal consent obtained. Diagnostic Nasal Endoscopy Pre-procedure diagnosis: Nasal obstruction, rhinitis medicamentosa Post-procedure diagnosis: same Indication: See pre-procedure diagnosis and physical exam above Complications: None  apparent EBL: 0 mL Anesthesia: Lidocaine  4% and topical decongestant was topically sprayed in each nasal cavity  Description of Procedure:  Patient was identified. A rigid 30 degree endoscope was utilized to evaluate the sinonasal cavities, mucosa, sinus ostia and turbinates and septum.  Overall, signs of mucosal inflammation are noted (fair amount of mucosal edema) - erythema suggestive of rhinitis medicamentosa. Moderate lateralization of middle turbinates preventing view of sinus cavities. No mucopurulence, polyps, or masses noted.      CPT CODE -- 31231 - Mod 25   ASSESSMENT:  53 y.o. with:  1. Rhinitis medicamentosa   2. Nasal congestion   3. Nasal obstruction   4. Other chronic sinusitis   5. S/P FESS (functional endoscopic sinus surgery)   6. Tobacco use disorder    H/o FESS and septoplasty in 2019 with current afrin use suggestive of rhinitis medicamentosa. Persistent symptoms, on CPAP. We discussed need to discontinue afrin for rebound and will start with medical management.  Given the patient's tobacco use, I also discussed cessation and options for cessation, including counseling.  Counseled patient on the dangers of tobacco use, advised patient to stop smoking, and reviewed strategies to maximize success. Total time spent with this was 4 minutes.    PLAN: We've discussed issues and options today.  We reviewed the nasal endoscopy images together.  The risks, benefits and alternatives were discussed and questions answered.  He has elected to proceed with:  1) Discontinue afrin 2) Start sinus rinses 3) Flonase  and astelin  BID 4) Will do pred burst to reduce mucosal edema Follow-up in 3 months  See below regarding exact medications prescribed this encounter including dosages and route: Meds ordered this encounter  Medications   predniSONE  (DELTASONE ) 10 MG tablet    Sig: Take 2 tablets (20 mg total) by mouth daily with breakfast for 5 days, THEN 1 tablet (10 mg total) daily with breakfast for 5 days.    Dispense:  15 tablet    Refill:  0   fluticasone  (FLONASE  SENSIMIST) 27.5 MCG/SPRAY nasal spray    Sig: Place 2 sprays into the nose in the morning and at bedtime.    Dispense:  10 g    Refill:  12   azelastine  (ASTELIN ) 0.1 % nasal spray    Sig: Place 2 sprays into both nostrils 2 (two) times daily. Use in each nostril as directed    Dispense:  30 mL    Refill:  12     Thank you for allowing me the opportunity to care for your patient. Please do not hesitate to contact me should you have any other questions.  Sincerely, Eldora Blanch, MD Otolaryngologist (ENT), [facility] Phone: [phone] Fax: [phone]  MDM:  [phone] Complexity/Problems addressed: mod - chronic problems Data complexity: mod - independent review of note, labs, test - Morbidity: mod  - Prescription Drug prescribed or managed: y  07/29/2024, 11:54 AM

## 2024-07-19 ENCOUNTER — Encounter (HOSPITAL_BASED_OUTPATIENT_CLINIC_OR_DEPARTMENT_OTHER): Attending: Internal Medicine | Admitting: Internal Medicine

## 2024-07-19 DIAGNOSIS — Z720 Tobacco use: Secondary | ICD-10-CM | POA: Insufficient documentation

## 2024-07-19 DIAGNOSIS — G9009 Other idiopathic peripheral autonomic neuropathy: Secondary | ICD-10-CM | POA: Insufficient documentation

## 2024-07-19 DIAGNOSIS — L97522 Non-pressure chronic ulcer of other part of left foot with fat layer exposed: Secondary | ICD-10-CM | POA: Insufficient documentation

## 2024-07-29 ENCOUNTER — Other Ambulatory Visit: Payer: Self-pay

## 2024-07-29 DIAGNOSIS — E559 Vitamin D deficiency, unspecified: Secondary | ICD-10-CM

## 2024-07-29 DIAGNOSIS — I10 Essential (primary) hypertension: Secondary | ICD-10-CM

## 2024-07-31 ENCOUNTER — Other Ambulatory Visit

## 2024-07-31 DIAGNOSIS — I10 Essential (primary) hypertension: Secondary | ICD-10-CM

## 2024-07-31 DIAGNOSIS — E559 Vitamin D deficiency, unspecified: Secondary | ICD-10-CM

## 2024-08-01 ENCOUNTER — Ambulatory Visit: Payer: Self-pay

## 2024-08-02 ENCOUNTER — Encounter (HOSPITAL_BASED_OUTPATIENT_CLINIC_OR_DEPARTMENT_OTHER): Admitting: General Surgery

## 2024-08-02 DIAGNOSIS — L97522 Non-pressure chronic ulcer of other part of left foot with fat layer exposed: Secondary | ICD-10-CM | POA: Diagnosis not present

## 2024-08-02 LAB — CBC WITH DIFFERENTIAL/PLATELET
Basophils Absolute: 0.1 x10E3/uL (ref 0.0–0.2)
Basos: 1 %
EOS (ABSOLUTE): 0.4 x10E3/uL (ref 0.0–0.4)
Eos: 4 %
Hematocrit: 45.7 % (ref 37.5–51.0)
Hemoglobin: 15.4 g/dL (ref 13.0–17.7)
Immature Grans (Abs): 0.1 x10E3/uL (ref 0.0–0.1)
Immature Granulocytes: 1 %
Lymphocytes Absolute: 2.5 x10E3/uL (ref 0.7–3.1)
Lymphs: 26 %
MCH: 31.5 pg (ref 26.6–33.0)
MCHC: 33.7 g/dL (ref 31.5–35.7)
MCV: 94 fL (ref 79–97)
Monocytes Absolute: 0.5 x10E3/uL (ref 0.1–0.9)
Monocytes: 5 %
Neutrophils Absolute: 6.2 x10E3/uL (ref 1.4–7.0)
Neutrophils: 63 %
Platelets: 195 x10E3/uL (ref 150–450)
RBC: 4.89 x10E6/uL (ref 4.14–5.80)
RDW: 15 % (ref 11.6–15.4)
WBC: 9.8 x10E3/uL (ref 3.4–10.8)

## 2024-08-02 LAB — HEMOGLOBIN A1C
Est. average glucose Bld gHb Est-mCnc: 88 mg/dL
Hgb A1c MFr Bld: 4.7 % — ABNORMAL LOW (ref 4.8–5.6)

## 2024-08-02 LAB — COMPREHENSIVE METABOLIC PANEL WITH GFR
ALT: 12 IU/L (ref 0–44)
AST: 13 IU/L (ref 0–40)
Albumin: 4.1 g/dL (ref 3.8–4.9)
Alkaline Phosphatase: 125 IU/L — ABNORMAL HIGH (ref 47–123)
BUN/Creatinine Ratio: 14 (ref 9–20)
BUN: 15 mg/dL (ref 6–24)
Bilirubin Total: 0.5 mg/dL (ref 0.0–1.2)
CO2: 23 mmol/L (ref 20–29)
Calcium: 8.9 mg/dL (ref 8.7–10.2)
Chloride: 101 mmol/L (ref 96–106)
Creatinine, Ser: 1.08 mg/dL (ref 0.76–1.27)
Globulin, Total: 3.4 g/dL (ref 1.5–4.5)
Glucose: 116 mg/dL — ABNORMAL HIGH (ref 70–99)
Potassium: 3.9 mmol/L (ref 3.5–5.2)
Sodium: 137 mmol/L (ref 134–144)
Total Protein: 7.5 g/dL (ref 6.0–8.5)
eGFR: 81 mL/min/1.73 (ref >59)

## 2024-08-02 LAB — LIPID PANEL
Chol/HDL Ratio: 3.7 ratio (ref 0.0–5.0)
Cholesterol, Total: 96 mg/dL — ABNORMAL LOW (ref 100–199)
HDL: 26 mg/dL — ABNORMAL LOW (ref >39)
LDL Chol Calc (NIH): 49 mg/dL (ref 0–99)
Triglycerides: 116 mg/dL (ref 0–149)
VLDL Cholesterol Cal: 21 mg/dL (ref 5–40)

## 2024-08-02 LAB — VITAMIN D 25 HYDROXY (VIT D DEFICIENCY, FRACTURES): Vit D, 25-Hydroxy: 24.2 ng/mL — ABNORMAL LOW (ref 30.0–100.0)

## 2024-08-02 LAB — TSH: TSH: 1.83 u[IU]/mL (ref 0.450–4.500)

## 2024-08-02 LAB — APOLIPOPROTEIN B: Apolipoprotein B: 49 mg/dL (ref <90)

## 2024-08-02 LAB — LIPOPROTEIN A (LPA): Lipoprotein (a): 12.8 nmol/L (ref <75.0)

## 2024-08-03 ENCOUNTER — Ambulatory Visit

## 2024-08-03 VITALS — BP 138/82 | HR 83 | Temp 97.7°F | Ht 77.0 in | Wt 338.0 lb

## 2024-08-03 DIAGNOSIS — I10 Essential (primary) hypertension: Secondary | ICD-10-CM | POA: Diagnosis not present

## 2024-08-03 DIAGNOSIS — Z0000 Encounter for general adult medical examination without abnormal findings: Secondary | ICD-10-CM | POA: Diagnosis not present

## 2024-08-03 DIAGNOSIS — E559 Vitamin D deficiency, unspecified: Secondary | ICD-10-CM

## 2024-08-03 DIAGNOSIS — L739 Follicular disorder, unspecified: Secondary | ICD-10-CM

## 2024-08-03 DIAGNOSIS — G4719 Other hypersomnia: Secondary | ICD-10-CM

## 2024-08-03 DIAGNOSIS — J310 Chronic rhinitis: Secondary | ICD-10-CM

## 2024-08-03 DIAGNOSIS — G609 Hereditary and idiopathic neuropathy, unspecified: Secondary | ICD-10-CM

## 2024-08-03 DIAGNOSIS — F17200 Nicotine dependence, unspecified, uncomplicated: Secondary | ICD-10-CM

## 2024-08-03 DIAGNOSIS — F411 Generalized anxiety disorder: Secondary | ICD-10-CM

## 2024-08-03 DIAGNOSIS — G4733 Obstructive sleep apnea (adult) (pediatric): Secondary | ICD-10-CM

## 2024-08-03 DIAGNOSIS — G4730 Sleep apnea, unspecified: Secondary | ICD-10-CM

## 2024-08-03 DIAGNOSIS — Z122 Encounter for screening for malignant neoplasm of respiratory organs: Secondary | ICD-10-CM

## 2024-08-03 MED ORDER — PHENTERMINE HCL 37.5 MG PO TABS: 37.5000 mg | ORAL_TABLET | Freq: Every day | ORAL | 2 refills | Status: DC

## 2024-08-03 MED ORDER — LOSARTAN POTASSIUM-HCTZ 50-12.5 MG PO TABS: 1.0000 | ORAL_TABLET | Freq: Every day | ORAL | 2 refills | Status: DC

## 2024-08-03 NOTE — Patient Instructions (Signed)
 VISIT SUMMARY: Today, we addressed your excessive daytime sleepiness, high blood pressure, shoulder pain from a recent fall, tobacco use, and depressive symptoms. We also discussed your general health maintenance and wellness.  YOUR PLAN: EXCESSIVE DAYTIME SLEEPINESS AND NARCOLEPSY: You have been experiencing worsening daytime sleepiness and have a history of narcolepsy. -You will be urgently referred to sleep medicine for a sleep study and further evaluation. -Start taking phentermine 30 mg. Begin with half a tablet and increase to a full tablet if tolerated. Take it in the morning before work.  HYPERTENSION WITH LOWER EXTREMITY EDEMA: Your blood pressure is high, and you have swelling in your feet and legs, likely related to stress. -Start taking losartan 50 mg combined with hydrochlorothiazide . -Monitor your blood pressure at home and record your readings.  DEPRESSION: Your depression has worsened due to stressors. -We discussed starting Zoloft  for anxiety and depression, but we will delay this for now.  TOBACCO USE DISORDER: You are seeking help to quit smoking. -We discussed smoking cessation options, including Chantix , but will revisit this later.  VITAMIN D  DEFICIENCY: You have a vitamin D  deficiency contributing to fatigue. -Take an over-the-counter vitamin D  supplement, 2000-4000 IU daily.  RIGHT SHOULDER CONTUSION: You have a shoulder injury from a recent fall. -Monitor your shoulder pain for 2-3 more weeks. -Consider imaging if the pain persists beyond 4 weeks.  ADULT WELLNESS VISIT: Routine wellness visit with a review of your labs and health maintenance. -Cancel your upcoming physical appointment. -Schedule a lung cancer screening at [facility]. -Get a flu shot if you would like.  If you have any problems before your next visit feel free to message me via MyChart (minor issues or questions) or call the office, otherwise you may reach out to schedule an office  visit.  Thank you! Saddie Sacks, PA-C

## 2024-08-04 DIAGNOSIS — Z0000 Encounter for general adult medical examination without abnormal findings: Secondary | ICD-10-CM | POA: Insufficient documentation

## 2024-08-04 NOTE — Assessment & Plan Note (Signed)
 Peripheral neuropathy managed with oxycodone  prescribed by pain management.

## 2024-08-04 NOTE — Assessment & Plan Note (Signed)
 Patient still did not do trial of Zepbound  as he reports there were issues at the pharmacy. Current weight 338, BMI 40.09. This is 19 lbs down from his last visit in July. Congratulated him on his weight loss efforts.  Given his desire to lose weight with concurrent daytime sleepiness, patient would benefit from stimulant medication such as Phentermine to tackle both issues.  -Rx Phentermine 37.5 mg; instructed to take half tablet for 1 week before increasing to full tablet - Educated on potential side effects.  - Referral to pulmonology for further management of sleep apnea and potential narcolepsy.

## 2024-08-04 NOTE — Assessment & Plan Note (Signed)
 Continued tobacco use, desire to quit. Previous negative experience with Wellbutrin  and Chantix . States that nicotine  replacement has not helped him.  Will defer cessation at this time while we are working up his daytime sleepiness/potential narcolepsy.

## 2024-08-04 NOTE — Assessment & Plan Note (Signed)
 Patient has appointment with psychiatry next month to discuss GeneSight testing as he has tried and failed Zoloft  and Cymbalta  and is hesitant to try anything else. Patient would like to hold off on treatments at this time until he can get his daytime sleepiness under control.

## 2024-08-04 NOTE — Assessment & Plan Note (Signed)
 Clinically improving after seeing ENT and discontinuing Afrin. Now using flonase  and Azelastine  daily. Will cont to monitor.

## 2024-08-04 NOTE — Assessment & Plan Note (Signed)
 Vitamin D  deficiency contributing to fatigue. Discussed supplementation to improve energy. - Recommend over-the-counter vitamin D  supplement, 2000-4000 IU daily.

## 2024-08-04 NOTE — Assessment & Plan Note (Signed)
 BP Goal <130/80. Above goal in office and on recheck today. Discussed that we have deferred treatment for this for quite some time now. Patient open to a trial of Losartan-hydrochlorothiazide  50-12.5 mg daily for BP.  - Instruct to monitor blood pressure at home and record readings. - Follow up on BP in 8 weeks and adjust medication as indicated.

## 2024-08-04 NOTE — Assessment & Plan Note (Signed)
 Routine wellness visit. Labs reviewed, low HDL noted. Discussed health maintenance and screenings. - Cancel upcoming physical appointment as we went over labs in detail today.  - Conduct lung cancer screening at [facility]. - Declined flu shot - Other screenings are UTD

## 2024-08-04 NOTE — Progress Notes (Signed)
 Established Patient Office Visit  Subjective   Patient ID: Eduardo [name], male    DOB: 1971-07-28  Age: 53 y.o. MRN: [id number]  Chief Complaint  Patient presents with   Medical Management of Chronic Issues    HPI  History of Present Illness   Eduardo [name] is a 53 year old male with narcolepsy and hypertension who presents with excessive daytime sleepiness and high blood pressure.  Excessive daytime sleepiness and narcolepsy - Excessive daytime sleepiness worsening over time - Frequent episodes of falling asleep in public places, raising safety concerns - Difficulty staying awake during work hours, especially in the evening - Recent increase in work-related stress contributing to sleepiness - Prior sleep study completed - Not currently using a CPAP machine due to the mask causing feelings of suffocation and anxiety  - Has not started Zepbound  for sleep apnea and weight loss due to insurance approval issues  Hypertension and peripheral edema - History of elevated blood pressure for an extended period without treatment  - No current antihypertensive medication use - Attributes elevated blood pressure to stress and anxiety  Shoulder pain following fall - Recent fall resulting in hard, localized shoulder pain - Fall occurred after standing up and falling asleep, leading to impact injury on shoulder  Chronic wounds  - Following with wound care for chronic wounds on back and buttocks. Reports they are healing well since the last time I saw him.   Tobacco use and smoking cessation - History of smoking - Currently seeking assistance to quit smoking - Finds nicotine  patches insufficient for cessation, has also tried and failed Chantix  and Wellbutrin  - Stress contributes to ongoing tobacco use  Depressive symptoms - History of depression - Depression exacerbated by the anniversary of his brother's death - Describes current depressive symptoms as 'skyrocketing'  - Has  never been on medication for anxiety before. Not interested in therapy at this given time.          ROS Per HPI.    Objective:     BP 138/82   Pulse 83   Temp 97.7 F (36.5 C) (Oral)   Ht 6' 5 (1.956 m)   Wt (!) 338 lb 0.6 oz (153.3 kg)   SpO2 96%   BMI 40.09 kg/m    Physical Exam Constitutional:      General: He is not in acute distress.    Appearance: Normal appearance.  Cardiovascular:     Rate and Rhythm: Normal rate and regular rhythm.     Heart sounds: Normal heart sounds. No murmur heard.    No friction rub. No gallop.  Pulmonary:     Effort: Pulmonary effort is normal. No respiratory distress.     Breath sounds: Normal breath sounds.  Abdominal:     General: Bowel sounds are normal.  Musculoskeletal:        General: No swelling.  Skin:    General: Skin is warm and dry.  Neurological:     General: No focal deficit present.     Mental Status: He is alert.     Cranial Nerves: Cranial nerves 2-12 are intact.  Psychiatric:        Mood and Affect: Mood normal.        Behavior: Behavior normal.        Thought Content: Thought content normal.      No results found for any visits on 08/03/24.  Last CBC Lab Results  Component Value Date   WBC 9.8 07/31/2024  HGB 15.4 07/31/2024   HCT 45.7 07/31/2024   MCV 94 07/31/2024   MCH 31.5 07/31/2024   RDW 15.0 07/31/2024   PLT 195 07/31/2024   Last metabolic panel Lab Results  Component Value Date   GLUCOSE 116 (H) 07/31/2024   NA 137 07/31/2024   K 3.9 07/31/2024   CL 101 07/31/2024   CO2 23 07/31/2024   BUN 15 07/31/2024   CREATININE 1.08 07/31/2024   EGFR 81 07/31/2024   CALCIUM  8.9 07/31/2024   PROT 7.5 07/31/2024   ALBUMIN 4.1 07/31/2024   LABGLOB 3.4 07/31/2024   AGRATIO 1.5 03/16/2023   BILITOT 0.5 07/31/2024   ALKPHOS 125 (H) 07/31/2024   AST 13 07/31/2024   ALT 12 07/31/2024   ANIONGAP 12 06/21/2024   Last lipids Lab Results  Component Value Date   CHOL 96 (L) 07/31/2024    HDL 26 (L) 07/31/2024   LDLCALC 49 07/31/2024   LDLDIRECT 29 04/12/2020   TRIG 116 07/31/2024   CHOLHDL 3.7 07/31/2024   Last hemoglobin A1c Lab Results  Component Value Date   HGBA1C 4.7 (L) 07/31/2024   Last thyroid functions Lab Results  Component Value Date   TSH 1.830 07/31/2024   Last vitamin D  Lab Results  Component Value Date   VD25OH 24.2 (L) 07/31/2024      The ASCVD Risk score (Arnett DK, et al., 2019) failed to calculate for the following reasons:   The valid total cholesterol range is 130 to 320 mg/dL    Assessment & Plan:   Screening for lung cancer -     CT CHEST LUNG CANCER SCREENING LOW DOSE WO CONTRAST; Future  Sleep apnea, unspecified type -     Pulmonary Visit  Excessive daytime sleepiness -     Pulmonary Visit  Chronic idiopathic axonal polyneuropathy Assessment & Plan: Peripheral neuropathy managed with oxycodone  prescribed by pain management.    Chronic folliculitis Assessment & Plan: Current wounds healing well without complication. Continue regular follow up with wound care as scheduled.   Chronic rhinitis Assessment & Plan: Clinically improving after seeing ENT and discontinuing Afrin. Now using flonase  and Azelastine  daily. Will cont to monitor.   Generalized anxiety disorder Assessment & Plan: Patient has appointment with psychiatry next month to discuss GeneSight testing as he has tried and failed Zoloft  and Cymbalta  and is hesitant to try anything else. Patient would like to hold off on treatments at this time until he can get his daytime sleepiness under control.    Hypertension, unspecified type Assessment & Plan: BP Goal <130/80. Above goal in office and on recheck today. Discussed that we have deferred treatment for this for quite some time now. Patient open to a trial of Losartan-hydrochlorothiazide  50-12.5 mg daily for BP.  - Instruct to monitor blood pressure at home and record readings. - Follow up on BP in 8 weeks  and adjust medication as indicated.   Morbid obesity (HCC) Assessment & Plan: Patient still did not do trial of Zepbound  as he reports there were issues at the pharmacy. Current weight 338, BMI 40.09. This is 19 lbs down from his last visit in July. Congratulated him on his weight loss efforts.  Given his desire to lose weight with concurrent daytime sleepiness, patient would benefit from stimulant medication such as Phentermine to tackle both issues.  -Rx Phentermine 37.5 mg; instructed to take half tablet for 1 week before increasing to full tablet - Educated on potential side effects.  - Referral to pulmonology for  further management of sleep apnea and potential narcolepsy.   Vitamin D  deficiency Assessment & Plan: Vitamin D  deficiency contributing to fatigue. Discussed supplementation to improve energy. - Recommend over-the-counter vitamin D  supplement, 2000-4000 IU daily.   Nicotine  dependence with current use Assessment & Plan: Continued tobacco use, desire to quit. Previous negative experience with Wellbutrin  and Chantix . States that nicotine  replacement has not helped him.  Will defer cessation at this time while we are working up his daytime sleepiness/potential narcolepsy.     OSA on CPAP Assessment & Plan: Patient has severe OSA and has been unable to tolerate CPAP due to claustrophobia. Per pulm, he is not a candidate for hypoglossal nerve stimulator implant due to high BMI. BiPAP trial failed. Patient did not do the Zepbound  due to fear of side effects and insurance issues.  Have referred back to pulmonology for potential work up on narcolepsy.    Wellness examination Assessment & Plan: Routine wellness visit. Labs reviewed, low HDL noted. Discussed health maintenance and screenings. - Cancel upcoming physical appointment as we went over labs in detail today.  - Conduct lung cancer screening at [facility]. - Declined flu shot - Other screenings are UTD       Other orders -     Losartan Potassium-HCTZ; Take 1 tablet by mouth daily.  Dispense: 90 tablet; Refill: 2 -     Phentermine HCl; Take 1 tablet (37.5 mg total) by mouth daily before breakfast.  Dispense: 30 tablet; Refill: 2     Return in about 8 weeks (around 09/28/2024) for HTN, Weight, OSA, mood.    Saddie JULIANNA Sacks, PA-C

## 2024-08-04 NOTE — Assessment & Plan Note (Signed)
 Current wounds healing well without complication. Continue regular follow up with wound care as scheduled.

## 2024-08-04 NOTE — Assessment & Plan Note (Signed)
 Patient has severe OSA and has been unable to tolerate CPAP due to claustrophobia. Per pulm, he is not a candidate for hypoglossal nerve stimulator implant due to high BMI. BiPAP trial failed. Patient did not do the Zepbound  due to fear of side effects and insurance issues.  Have referred back to pulmonology for potential work up on narcolepsy.

## 2024-08-07 ENCOUNTER — Encounter

## 2024-08-09 ENCOUNTER — Encounter (HOSPITAL_BASED_OUTPATIENT_CLINIC_OR_DEPARTMENT_OTHER): Admitting: General Surgery

## 2024-08-09 DIAGNOSIS — L97522 Non-pressure chronic ulcer of other part of left foot with fat layer exposed: Secondary | ICD-10-CM | POA: Diagnosis not present

## 2024-08-11 ENCOUNTER — Ambulatory Visit
Admission: RE | Admit: 2024-08-11 | Discharge: 2024-08-11 | Disposition: A | Discharge status: Home / Self Care | Source: Ambulatory Visit

## 2024-08-11 DIAGNOSIS — Z122 Encounter for screening for malignant neoplasm of respiratory organs: Secondary | ICD-10-CM

## 2024-08-15 ENCOUNTER — Ambulatory Visit (INDEPENDENT_AMBULATORY_CARE_PROVIDER_SITE_OTHER): Admitting: Licensed Clinical Social Worker

## 2024-08-15 ENCOUNTER — Encounter (HOSPITAL_COMMUNITY): Payer: Self-pay | Admitting: Licensed Clinical Social Worker

## 2024-08-15 DIAGNOSIS — F17200 Nicotine dependence, unspecified, uncomplicated: Secondary | ICD-10-CM

## 2024-08-15 DIAGNOSIS — F332 Major depressive disorder, recurrent severe without psychotic features: Secondary | ICD-10-CM | POA: Diagnosis not present

## 2024-08-15 DIAGNOSIS — F411 Generalized anxiety disorder: Secondary | ICD-10-CM

## 2024-08-15 DIAGNOSIS — F5104 Psychophysiologic insomnia: Secondary | ICD-10-CM
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
## 2024-08-16 ENCOUNTER — Ambulatory Visit: Payer: Self-pay

## 2024-08-16 ENCOUNTER — Encounter (HOSPITAL_BASED_OUTPATIENT_CLINIC_OR_DEPARTMENT_OTHER): Admitting: General Surgery

## 2024-08-16 ENCOUNTER — Telehealth: Payer: Self-pay

## 2024-08-16 ENCOUNTER — Other Ambulatory Visit: Payer: Self-pay

## 2024-08-16 DIAGNOSIS — L97522 Non-pressure chronic ulcer of other part of left foot with fat layer exposed: Secondary | ICD-10-CM | POA: Diagnosis not present

## 2024-08-16 DIAGNOSIS — R911 Solitary pulmonary nodule: Secondary | ICD-10-CM

## 2024-08-16 NOTE — Telephone Encounter (Unsigned)
 Copied from CRM #[id number]. Topic: General - Other >> Aug 16, 2024 11:55 AM Tiffini S wrote: Reason for CRM: Diane with [facility] Radiology [phone] called about a CP Chest Lung results on 08/11/24 - Imaging  IMPRESSION: 1. Lung-RADS 4B, suspicious. Additional imaging evaluation or consultation with Pulmonology or Thoracic Surgery recommended. Increasing part solid nodule in the right lower lobe with increasing ground-glass and solid components. 2. Remaining pulmonary nodules are stable. 3. Coronary artery calcifications. 4.  Emphysema (ICD10-J43.9).  Please call the patient with the results at [phone]

## 2024-08-23 ENCOUNTER — Telehealth: Payer: Self-pay

## 2024-08-23 NOTE — Telephone Encounter (Signed)
 This is what his appointment with Dr. Zaida is for on 08/31/24

## 2024-08-23 NOTE — Telephone Encounter (Signed)
 Copied from CRM [id number]. Topic: Clinical - Request for Lab/Test Order >> Aug 23, 2024 12:36 PM Shanda MATSU wrote: Reason for CRM: Patient calling in regards to cherst scan that provider adv him she needed him to have done, patient stated no one has reached out to him to see about scheduling this, is req a call back.

## 2024-08-24 NOTE — Telephone Encounter (Signed)
 Called and spoke with patient; he's aware of the appointment(11/13) with Dr. Zaida is for the chest scan.

## 2024-08-25 ENCOUNTER — Ambulatory Visit (INDEPENDENT_AMBULATORY_CARE_PROVIDER_SITE_OTHER): Admitting: Physician Assistant

## 2024-08-25 ENCOUNTER — Ambulatory Visit: Payer: Self-pay

## 2024-08-25 VITALS — BP 135/91 | HR 78 | Temp 98.2°F | Ht 76.0 in | Wt 341.2 lb

## 2024-08-25 DIAGNOSIS — F5104 Psychophysiologic insomnia: Secondary | ICD-10-CM

## 2024-08-25 DIAGNOSIS — F411 Generalized anxiety disorder: Secondary | ICD-10-CM | POA: Diagnosis not present

## 2024-08-25 DIAGNOSIS — F332 Major depressive disorder, recurrent severe without psychotic features: Secondary | ICD-10-CM

## 2024-08-25 MED ORDER — VORTIOXETINE HBR 10 MG PO TABS: 10.0000 mg | ORAL_TABLET | Freq: Every day | ORAL | 1 refills | Status: AC

## 2024-08-25 MED ORDER — MIRTAZAPINE 15 MG PO TABS: 15.0000 mg | ORAL_TABLET | Freq: Every day | ORAL | 1 refills | Status: AC
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
## 2024-08-25 NOTE — Telephone Encounter (Signed)
 FYI Only or Action Required?: FYI only for provider: patient states he will be going to [facility] today.  Patient was last seen in primary care on 08/03/2024 by Gayle Saddie FALCON, PA-C.  Called Nurse Triage reporting Shoulder Pain.  Symptoms began several weeks ago.  Interventions attempted: Prescription medications: percocet and Rest, hydration, or home remedies.  Symptoms are: gradually worsening.  Triage Disposition: Go to ED Now (or PCP Triage)  Patient/caregiver understands and will follow disposition?: Yes  Message from [facility] C sent at 08/25/2024  8:44 AM EST  Summary: right shoulder   Reason for Triage: The patient shares that they are continuing to experience significant discomfort and concern with their right shoulder. The patient shares that their work duties are more than likely creating problems for them. The patient would like to discuss further .         Reason for Disposition  [1] SEVERE pain AND [2] not improved 2 hours after pain medicine  Answer Assessment - Initial Assessment Questions Patient reports severe pain to right shoulder. States pain has been going on for a couple of weeks. States the pain is related to his job. 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 Patient reports he will be going to [facility] today. Patient is reminded of his appointment with PCP on 08/29/2024 for FMLA paperwork.   1. ONSET: When did the pain start?     Couple of weeks 2. LOCATION: Where is the pain located?     Right shoulder 3. PAIN: How bad is the pain? (Scale 1-10; or mild, moderate, severe)     10 4. WORK OR EXERCISE: Has there been any recent work or exercise that involved this part of the body?     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 CAUSE: What do you think is causing the shoulder pain?     From his job.  6.  OTHER SYMPTOMS: Do you have any other symptoms? (e.g., neck pain, swelling, rash, fever, numbness, weakness)     no  Protocols used: Shoulder Pain-A-AH

## 2024-08-28 ENCOUNTER — Encounter (HOSPITAL_BASED_OUTPATIENT_CLINIC_OR_DEPARTMENT_OTHER): Attending: General Surgery | Admitting: General Surgery

## 2024-08-28 DIAGNOSIS — Z720 Tobacco use: Secondary | ICD-10-CM | POA: Insufficient documentation

## 2024-08-28 DIAGNOSIS — L97522 Non-pressure chronic ulcer of other part of left foot with fat layer exposed: Secondary | ICD-10-CM | POA: Insufficient documentation

## 2024-08-28 DIAGNOSIS — G9009 Other idiopathic peripheral autonomic neuropathy: Secondary | ICD-10-CM | POA: Insufficient documentation

## 2024-08-29 ENCOUNTER — Ambulatory Visit (INDEPENDENT_AMBULATORY_CARE_PROVIDER_SITE_OTHER)

## 2024-08-29 VITALS — BP 154/83 | HR 89 | Temp 98.1°F | Ht 77.0 in | Wt 342.1 lb

## 2024-08-29 DIAGNOSIS — L97512 Non-pressure chronic ulcer of other part of right foot with fat layer exposed: Secondary | ICD-10-CM

## 2024-08-29 DIAGNOSIS — R911 Solitary pulmonary nodule: Secondary | ICD-10-CM | POA: Insufficient documentation

## 2024-08-29 DIAGNOSIS — I10 Essential (primary) hypertension: Secondary | ICD-10-CM

## 2024-08-29 DIAGNOSIS — G4733 Obstructive sleep apnea (adult) (pediatric): Secondary | ICD-10-CM

## 2024-08-29 DIAGNOSIS — F321 Major depressive disorder, single episode, moderate: Secondary | ICD-10-CM | POA: Diagnosis not present

## 2024-08-29 DIAGNOSIS — L97522 Non-pressure chronic ulcer of other part of left foot with fat layer exposed: Secondary | ICD-10-CM

## 2024-08-29 DIAGNOSIS — J432 Centrilobular emphysema: Secondary | ICD-10-CM

## 2024-08-29 DIAGNOSIS — G894 Chronic pain syndrome: Secondary | ICD-10-CM | POA: Insufficient documentation

## 2024-08-29 DIAGNOSIS — E781 Pure hyperglyceridemia: Secondary | ICD-10-CM

## 2024-08-29 NOTE — Assessment & Plan Note (Signed)
 COPD with emphysema previously diagnosed 2023.  Efforts towards smoking cessation have been unsuccessful.  These efforts have included Chantix , bupropion , nicotine  replacement.  Not currently using inhalers although he was previously prescribed Symbicort  and albuterol  as needed.  Appreciate pulmonology's recommendations for care at his follow-up appointment scheduled for 09-06-24

## 2024-08-29 NOTE — Assessment & Plan Note (Signed)
 Low-dose chest CT for lung cancer screening showed the following:   1. Lung-RADS 4B, suspicious. Additional imaging evaluation or consultation with Pulmonology or Thoracic Surgery recommended. Increasing part solid nodule in the right lower lobe with increasing ground-glass and solid components. 2. Remaining pulmonary nodules are stable. 3. Coronary artery calcifications. 4.  Emphysema (ICD10-J43.9)  I have informed the patient of the results and he has a follow-up to address this with pulmonology scheduled on 09/06/2024.

## 2024-08-29 NOTE — Assessment & Plan Note (Signed)
 Continue with current management as recommended by wound care and regular follow-ups with wound care.  His wound care specialist and I have both agreed that patient would benefit from [facility] from work so that he may remain off of his feet and appropriately allow these wounds to heal.

## 2024-08-29 NOTE — Assessment & Plan Note (Signed)
 Highly recommend trial of Zepbound  given that his insurance covered it under the diagnosis of sleep apnea, however patient is still reluctant due to side effects and fear because the medication is still new.  Current weight is up at 342, previous weight 1 month ago 338.  Will continue phentermine 37.5 mg daily for now to suppress appetite and for stimulant effect to help with daytime sleepiness.

## 2024-08-29 NOTE — Progress Notes (Signed)
 Established Patient Office Visit  Subjective   Patient ID: Eduardo [name], male    DOB: November 10, 1970  Age: 53 y.o. MRN: [id number]  Chief Complaint  Patient presents with   Medical Management of Chronic Issues    HPI  History of Present Illness   Eduardo [name] is a 53 year old male who presents to discuss FMLA and follow-up on multiple medical issues.  Chronic pain - Significant chronic pain persists. - He is prescribed Percocet 10-3 25 every 6 hours as needed for pain.  He follows with the pain management clinic for chronic pain syndrome.  Chronic pain has been exacerbated by acute shoulder pain which he was evaluated for by [facility] last week.  Workup is pending but patient is currently managing with his pre-existing pain regimen.  Narcolepsy/sleep apnea and pulmonary nodules - Symptoms of narcolepsy ongoing.  Energy level is not responding to phentermine. -Still falling asleep during the day, however episodes are only occurring when he is sitting still. - Upcoming pulmonology appointment scheduled for November 19th to address lung nodules and narcolepsy. -This is interfering with his ability to go to work - Patient has not been able to tolerate multiple different CPAP masks and is currently not wearing a CPAP at bedtime.  Zepbound  was approved by his insurance, however patient is skeptical to take this due to side effects.  Anxiety/depression -Patient recently had a new patient appointment with psychiatry to address anxiety and depression. - He was started on Trintellix and mirtazapine  3 days ago - Dizziness present since initiation of mirtazapine . - No other side effects noted today.  Has not noticed improvement in mood given that it has only been 3 days  Hypertension - Currently taking losartan-HCTZ 50/12.5 medication initiated at last visit. - No home blood pressure monitoring performed. - No adverse effects from antihypertensive medication. -He attributes his  high blood pressure today secondary to acute pain from his shoulder  Wound and infectious disease management - Receiving ongoing care from wound care and infectious disease specialists for chronic wounds on his feet, back, buttocks   Discussion regarding FMLA: Discussed with patient given his current, complex medical diagnoses including chronic pain syndrome with an acute exacerbation of pain, undergoing workup for narcolepsy and treatment of severe untreated sleep apnea with pulmonology, as well as the suspicious pulmonary nodules found on his recent chest CT, it would be reasonable to write him out of work with a work note for this week. When also taking into account his new diagnoses of anxiety and depression as well as the chronic wounds that have not been healing well on his feet, back, and buttocks, it would be a good idea for him to pursue FMLA from work as his current position does not allow him to appropriately stay off his feet for his chronic wounds to heal.  He is also going to have several specialist appointments with primary care, pulmonology, infectious disease, wound care, and psychiatry over the next several weeks, which will require him to be off work for.       ROS Per HPI.    Objective:     BP (!) 154/83   Pulse 89   Temp 98.1 F (36.7 C) (Oral)   Ht 6' 5 (1.956 m)   Wt (!) 342 lb 1.9 oz (155.2 kg)   SpO2 99%   BMI 40.57 kg/m    Physical Exam Constitutional:      General: He is not in acute distress.  Appearance: Normal appearance.  Cardiovascular:     Rate and Rhythm: Normal rate and regular rhythm.     Heart sounds: Normal heart sounds. No murmur heard.    No friction rub. No gallop.  Pulmonary:     Effort: Pulmonary effort is normal. No respiratory distress.     Breath sounds: Normal breath sounds.  Musculoskeletal:        General: No swelling.  Skin:    General: Skin is warm and dry.  Neurological:     General: No focal deficit present.      Mental Status: He is alert.  Psychiatric:        Mood and Affect: Mood normal.        Behavior: Behavior normal.        Thought Content: Thought content normal.      No results found for any visits on 08/29/24.  Last CBC Lab Results  Component Value Date   WBC 9.8 07/31/2024   HGB 15.4 07/31/2024   HCT 45.7 07/31/2024   MCV 94 07/31/2024   MCH 31.5 07/31/2024   RDW 15.0 07/31/2024   PLT 195 07/31/2024   Last metabolic panel Lab Results  Component Value Date   GLUCOSE 116 (H) 07/31/2024   NA 137 07/31/2024   K 3.9 07/31/2024   CL 101 07/31/2024   CO2 23 07/31/2024   BUN 15 07/31/2024   CREATININE 1.08 07/31/2024   EGFR 81 07/31/2024   CALCIUM  8.9 07/31/2024   PROT 7.5 07/31/2024   ALBUMIN 4.1 07/31/2024   LABGLOB 3.4 07/31/2024   AGRATIO 1.5 03/16/2023   BILITOT 0.5 07/31/2024   ALKPHOS 125 (H) 07/31/2024   AST 13 07/31/2024   ALT 12 07/31/2024   ANIONGAP 12 06/21/2024   Last lipids Lab Results  Component Value Date   CHOL 96 (L) 07/31/2024   HDL 26 (L) 07/31/2024   LDLCALC 49 07/31/2024   LDLDIRECT 29 04/12/2020   TRIG 116 07/31/2024   CHOLHDL 3.7 07/31/2024   Last hemoglobin A1c Lab Results  Component Value Date   HGBA1C 4.7 (L) 07/31/2024   Last thyroid functions Lab Results  Component Value Date   TSH 1.830 07/31/2024   Last vitamin D  Lab Results  Component Value Date   VD25OH 24.2 (L) 07/31/2024      The ASCVD Risk score (Arnett DK, et al., 2019) failed to calculate for the following reasons:   The valid total cholesterol range is 130 to 320 mg/dL    Assessment & Plan:   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  His wound care specialist and I have both agreed that patient would benefit from [facility] from work so that he may remain off of his feet and appropriately allow these wounds to heal.   OSA on  CPAP Assessment & Plan: Patient has severe OSA and has been unable to tolerate CPAP due to claustrophobia. Per pulm, he is not a candidate for hypoglossal nerve stimulator implant due to high BMI. BiPAP trial failed. Patient did not do the Zepbound  due to fear of side effects and insurance issues.  Have referred back to pulmonology for potential work up on narcolepsy.  Patient has an appointment scheduled for this discussion on 09/06/2024.   Morbid obesity (HCC) Assessment & Plan: Highly recommend trial of Zepbound  given that his insurance covered it under the diagnosis of sleep apnea, however  patient is still reluctant due to side effects and fear because the medication is still new.  Current weight is up at 342, previous weight 1 month ago 338.  Will continue phentermine 37.5 mg daily for now to suppress appetite and for stimulant effect to help with daytime sleepiness.   Current moderate episode of major depressive disorder without prior episode [facility]) Assessment & Plan: Patient has tried and failed Zoloft , Cymbalta , Pristiq , and Seroquel .  Patient had his first evaluation by psychiatry on 08-26-2024 and was started on Trintellix and mirtazapine .  So far he reports that he is doing well on these medications and denies side effects.  Advised him to continue following up with psychiatry as scheduled.  Will continue to monitor.   Centrilobular emphysema (HCC) Assessment & Plan: COPD with emphysema previously diagnosed 2023.  Efforts towards smoking cessation have been unsuccessful.  These efforts have included Chantix , bupropion , nicotine  replacement.  Not currently using inhalers although he was previously prescribed Symbicort  and albuterol  as needed.  Appreciate pulmonology's recommendations for care at his follow-up appointment scheduled for 09-06-24   Hypertension, unspecified type Assessment & Plan: BP goal <130/80.  Above goal in office and on recheck today.  At his visit 4 weeks ago, we  started losartan-hydrochlorothiazide  50-12.5 mg daily for BP.  Patient reports he is tolerating this medication well and attributes his high blood pressure today to acute shoulder pain.  Have instructed patient to monitor blood pressure at home and will follow-up on blood pressure in 4 weeks.  If blood pressure remains above goal, consider increasing to losartan-hydrochlorothiazide  100-50 mg and/or potentially adding amlodipine .   Hypertriglyceridemia Assessment & Plan: Last lipid panel: LDL 29, HDL 26, triglycerides 116.  Not currently on lipid-lowering medication and declines at this time.  Will revisit discussion at follow-up appointment.   Chronic pain syndrome Assessment & Plan: Follows with pain management and is currently managed with Percocet 10-325 mg every 6 hours as needed for pain.  Continue regular follow-ups with pain management and follow pain management protocol per their recommendations.   Lung nodule Assessment & Plan: Low-dose chest CT for lung cancer screening showed the following:   1. Lung-RADS 4B, suspicious. Additional imaging evaluation or consultation with Pulmonology or Thoracic Surgery recommended. Increasing part solid nodule in the right lower lobe with increasing ground-glass and solid components. 2. Remaining pulmonary nodules are stable. 3. Coronary artery calcifications. 4.  Emphysema (ICD10-J43.9)  I have informed the patient of the results and he has a follow-up to address this with pulmonology scheduled on 09/06/2024.    Return if symptoms worsen or fail to improve.    Saddie JULIANNA Sacks, PA-C

## 2024-08-29 NOTE — Assessment & Plan Note (Signed)
 Patient has tried and failed Zoloft , Cymbalta , Pristiq , and Seroquel .  Patient had his first evaluation by psychiatry on 08-26-2024 and was started on Trintellix and mirtazapine .  So far he reports that he is doing well on these medications and denies side effects.  Advised him to continue following up with psychiatry as scheduled.  Will continue to monitor.

## 2024-08-29 NOTE — Assessment & Plan Note (Signed)
 BP goal <130/80.  Above goal in office and on recheck today.  At his visit 4 weeks ago, we started losartan-hydrochlorothiazide  50-12.5 mg daily for BP.  Patient reports he is tolerating this medication well and attributes his high blood pressure today to acute shoulder pain.  Have instructed patient to monitor blood pressure at home and will follow-up on blood pressure in 4 weeks.  If blood pressure remains above goal, consider increasing to losartan-hydrochlorothiazide  100-50 mg and/or potentially adding amlodipine .

## 2024-08-29 NOTE — Assessment & Plan Note (Signed)
 Last lipid panel: LDL 29, HDL 26, triglycerides 116.  Not currently on lipid-lowering medication and declines at this time.  Will revisit discussion at follow-up appointment.

## 2024-08-29 NOTE — Assessment & Plan Note (Signed)
 Patient has severe OSA and has been unable to tolerate CPAP due to claustrophobia. Per pulm, he is not a candidate for hypoglossal nerve stimulator implant due to high BMI. BiPAP trial failed. Patient did not do the Zepbound  due to fear of side effects and insurance issues.  Have referred back to pulmonology for potential work up on narcolepsy.  Patient has an appointment scheduled for this discussion on 09/06/2024.

## 2024-08-29 NOTE — Patient Instructions (Signed)
 VISIT SUMMARY: You came in today for FMLA paperwork and follow-up on several health issues, including chronic pain, narcolepsy, dizziness from a new medication, high blood pressure, and ongoing wound care.  YOUR PLAN: CHRONIC PAIN SYNDROME: You are experiencing significant chronic pain. -Continue with your current pain management regimen. -We discussed the FMLA paperwork for potential work leave.  NARCOLEPSY:  -Attend your pulmonology appointment on November 19th to addres,  WOUNDS REQUIRING ONGOING CARE: You are receiving ongoing care from an infectious disease specialist for wounds on your backside. -Continue follow-up with your infectious disease specialist.  HYPERTENSION: Your blood pressure was high today, but you are not experiencing any side effects from your medication. -We rechecked your blood pressure before you left. -Continue taking your current blood pressure medication.  DEPRESSIVE DISORDER: Continue with your Trintellix and Mirtazapine  as prescribed by your psychiatrist.   GENERAL HEALTH MAINTENANCE: You are managing multiple health issues with a busy schedule and currently experiencing acute right shoulder pain.  -We provided a work leave note until Friday.  If you have any problems before your next visit feel free to message me via MyChart (minor issues or questions) or call the office, otherwise you may reach out to schedule an office visit.  Thank you! Saddie Sacks, PA-C

## 2024-08-29 NOTE — Assessment & Plan Note (Signed)
 Follows with pain management and is currently managed with Percocet 10-325 mg every 6 hours as needed for pain.  Continue regular follow-ups with pain management and follow pain management protocol per their recommendations.

## 2024-08-31 ENCOUNTER — Encounter

## 2024-09-05 ENCOUNTER — Encounter (HOSPITAL_BASED_OUTPATIENT_CLINIC_OR_DEPARTMENT_OTHER): Admitting: General Surgery

## 2024-09-06 ENCOUNTER — Ambulatory Visit (INDEPENDENT_AMBULATORY_CARE_PROVIDER_SITE_OTHER)

## 2024-09-06 ENCOUNTER — Encounter

## 2024-09-06 VITALS — BP 128/84 | HR 76 | Ht 77.0 in | Wt 353.4 lb

## 2024-09-06 DIAGNOSIS — G4733 Obstructive sleep apnea (adult) (pediatric): Secondary | ICD-10-CM | POA: Diagnosis not present

## 2024-09-06 DIAGNOSIS — R918 Other nonspecific abnormal finding of lung field: Secondary | ICD-10-CM

## 2024-09-06 NOTE — Patient Instructions (Signed)
  VISIT SUMMARY: Today, you were seen for a change in a lung nodule that was noted on a recent CT scan. We discussed your smoking habits, night sweats, and mental health concerns. We have outlined a plan to address each of these issues.  YOUR PLAN: -SOLITARY PULMONARY NODULE: A solitary pulmonary nodule is a small, round growth in the lung that can be benign or malignant. Your recent CT scan showed a slight increase in the size of the nodule in your right lower lung. We have ordered a PET scan to assess the activity of the nodule. Depending on the results, we may consider a biopsy. We will discuss the results and next steps over the phone after the PET scan.  -TOBACCO USE DISORDER: Tobacco use disorder is a condition where a person is dependent on tobacco. You currently smoke a pack to a pack and a half of cigarettes per day. Since Wellbutrin  was not effective for you and Chantix  is not an option due to your depression and anxiety, we have provided you with resources for smoking cessation, including nicotine  patches and lozenges. We also gave you the contact information for the [place]  quit line.  -DEPRESSION AND ANXIETY: Depression and anxiety are mental health conditions that can affect your mood and behavior. These conditions make it unsafe for you to use Chantix  for smoking cessation. We will continue to monitor and manage these conditions as part of your overall health plan.  -NIGHT SWEATS LOCALIZED TO HEAD: Night sweats localized to the head can be uncomfortable and may indicate an underlying issue. You have reported that these have worsened over time but have no other systemic symptoms. We will continue to monitor this condition and explore potential causes if it persists.  INSTRUCTIONS: Please schedule your PET scan as soon as possible. We will contact you by phone to discuss the results and any further steps. If you have any questions or concerns in the meantime, please do not hesitate  to reach out.       Contains text generated by [employer]bridge.

## 2024-09-06 NOTE — Progress Notes (Deleted)
 Subjective:   PATIENT ID: Eduardo [name] GENDER: male DOB: Jun 04, 1971, MRN: [id number]   HPI Discussed the use of AI scribe software for clinical note transcription with the patient, who gave verbal consent to proceed.  History of Present Illness      Past Medical History:  Diagnosis Date   Anxiety    Chronic back pain    Degenerative disk disease    Depression    DVT of upper extremity (deep vein thrombosis) (HCC)    GERD (gastroesophageal reflux disease)    Hypertension    MRSA (methicillin resistant staph aureus) culture positive    PAD (peripheral artery disease)    Peripheral arterial disease    Peripheral neuropathy    Pilonidal cyst    Pruritus 12/23/2021   Pulmonary embolus (HCC)    no blood thinner 2002   Sleep apnea    can't use cpap due to deviated nasal septumg     Family History  Problem Relation Age of Onset   Hypertension Mother    Diabetes Mother    Hypertension Father    Heart disease Father    Pancreatic cancer Brother    Cancer Maternal Grandmother        breast, ovarian & colon   Stroke Paternal Grandfather    Neuropathy Neg Hx    Colon cancer Neg Hx    Esophageal cancer Neg Hx    Liver disease Neg Hx      Social History   Socioeconomic History   Marital status: Single    Spouse name: Not on file   Number of children: 2   Years of education: 12th    Highest education level: 12th grade  Occupational History   Occupation: [name]  Tobacco Use   Smoking status: Every Day    Current packs/day: 1.00    Average packs/day: 1 pack/day for 74.7 years (74.7 ttl pk-yrs)    Types: Cigarettes    Start date: 12/18/1982    Passive exposure: Never   Smokeless tobacco: Never   Tobacco comments:    1- 1.5 ppd   Vaping Use   Vaping status: Former  Substance and Sexual Activity   Alcohol use: Yes    Comment: Rarely   Drug use: No   Sexual activity: Not Currently    Birth control/protection: None  Other Topics Concern   Not on file   Social History Narrative   Lives at home w/ his step-mother   Right-handed   Drinks about 2-3 Mt Dews a day   Social Drivers of Health   Financial Resource Strain: High Risk (08/29/2024)   Overall Financial Resource Strain (CARDIA)    Difficulty of Paying Living Expenses: Hard  Food Insecurity: Food Insecurity Present (08/29/2024)   Hunger Vital Sign    Worried About Running Out of Food in the Last Year: Sometimes true    Ran Out of Food in the Last Year: Often true  Transportation Needs: No Transportation Needs (08/29/2024)   PRAPARE - Administrator, Civil Service (Medical): No    Lack of Transportation (Non-Medical): No  Physical Activity: Insufficiently Active (08/29/2024)   Exercise Vital Sign    Days of Exercise per Week: 2 days    Minutes of Exercise per Session: 50 min  Stress: Stress Concern Present (08/29/2024)   [employer] of Occupational Health - Occupational Stress Questionnaire    Feeling of Stress: Very much  Social Connections: Socially Isolated (08/29/2024)   Social Connection and Isolation  Panel    Frequency of Communication with Friends and Family: Twice a week    Frequency of Social Gatherings with Friends and Family: Never    Attends Religious Services: Patient declined    Database Administrator or Organizations: No    Attends Engineer, Structural: Not on file    Marital Status: Divorced  Intimate Partner Violence: Not At Risk (08/03/2024)   Humiliation, Afraid, Rape, and Kick questionnaire    Fear of Current or Ex-Partner: No    Emotionally Abused: No    Physically Abused: No    Sexually Abused: No     Allergies  Allergen Reactions   Gabapentin  Other (See Comments)   Pollen Extract Cough   Dust Mite Extract Other (See Comments)   Other Other (See Comments)    Ragweed    Pregabalin  Swelling     Outpatient Medications Prior to Visit  Medication Sig Dispense Refill   Acetaminophen  (TYLENOL  ARTHRITIS PAIN PO) Take  600 mg by mouth 3 (three) times daily.     azelastine  (ASTELIN ) 0.1 % nasal spray Place 2 sprays into both nostrils 2 (two) times daily. Use in each nostril as directed 30 mL 12   losartan-hydrochlorothiazide  (HYZAAR) 50-12.5 MG tablet Take 1 tablet by mouth daily. 90 tablet 2   mirtazapine  (REMERON ) 15 MG tablet Take 1 tablet (15 mg total) by mouth at bedtime. 30 tablet 1   mupirocin  ointment (BACTROBAN ) 2 % Apply 1 Application topically daily. Apply to closed wounds on the back 22 g 0   oxyCODONE -acetaminophen  (PERCOCET) 10-325 MG tablet Take 1 tablet by mouth every 6 (six) hours as needed for pain.     vortioxetine HBr (TRINTELLIX) 10 MG TABS tablet Take 1 tablet (10 mg total) by mouth daily. 30 tablet 1   phentermine (ADIPEX-P) 37.5 MG tablet Take 1 tablet (37.5 mg total) by mouth daily before breakfast. (Patient not taking: Reported on 09/06/2024) 30 tablet 2   fluticasone  (FLONASE  SENSIMIST) 27.5 MCG/SPRAY nasal spray Place 2 sprays into the nose in the morning and at bedtime. 10 g 12   No facility-administered medications prior to visit.    ROS Reviewed all systems and reported negative except as above     Objective:   Vitals:   09/06/24 1035  BP: 128/84  Pulse: 76  SpO2: 97%  Weight: (!) 353 lb 6.4 oz (160.3 kg)  Height: 6' 5 (1.956 m)    Physical Exam Physical Exam      CBC    Component Value Date/Time   WBC 9.8 07/31/2024 0928   WBC 10.4 06/21/2024 1555   RBC 4.89 07/31/2024 0928   RBC 4.57 06/21/2024 1555   HGB 15.4 07/31/2024 0928   HCT 45.7 07/31/2024 0928   PLT 195 07/31/2024 0928   MCV 94 07/31/2024 0928   MCH 31.5 07/31/2024 0928   MCH 31.3 06/21/2024 1555   MCHC 33.7 07/31/2024 0928   MCHC 34.9 06/21/2024 1555   RDW 15.0 07/31/2024 0928   LYMPHSABS 2.5 07/31/2024 0928   MONOABS 0.6 06/21/2024 1555   EOSABS 0.4 07/31/2024 0928   BASOSABS 0.1 07/31/2024 0928     Chest imaging:  PFT:    Latest Ref Rng & Units 12/25/2021    9:45 AM  PFT  Results  FVC-Pre L 4.46   FVC-Predicted Pre % 75   FVC-Post L 4.49   FVC-Predicted Post % 75   Pre FEV1/FVC % % 76   Post FEV1/FCV % % 79   FEV1-Pre  L 3.38   FEV1-Predicted Pre % 73   FEV1-Post L 3.57   DLCO uncorrected ml/min/mmHg 28.81   DLCO UNC% % 84   DLCO corrected ml/min/mmHg 28.34   DLCO COR %Predicted % 83   DLVA Predicted % 89   TLC L 7.46   TLC % Predicted % 92   RV % Predicted % 88     Labs:    Echo:       Assessment & Plan:   Assessment and Plan Assessment & Plan         Zola Herter, MD [facility] Office: [phone]

## 2024-09-06 NOTE — Progress Notes (Signed)
 Subjective:   PATIENT ID: Eduardo [name] GENDER: male DOB: September 23, 1971, MRN: [id number]   HPI Discussed the use of AI scribe software for clinical note transcription with the patient, who gave verbal consent to proceed.  History of Present Illness Eduardo [name] is a 53 year old male who presents for evaluation of a change in a lung nodule. He was referred for evaluation of a change in size of a lung nodule noted on a recent CT scan.  He has been undergoing lung cancer screening with CT scans. On his most recent scan, a small nodule in the right lower lung was noted to have increased slightly in size.  He smokes a pack to a pack and a half of cigarettes per day. He has attempted to quit smoking in the past using Wellbutrin , which was ineffective.  He experiences night sweats, which he describes as occurring only on his head. These have been present in the past but have recently worsened.  He has a history of a blood clot that traveled from his arm to his lungs, leading to trauma and an aversion to having anything on his face, such as a CPAP mask. He has never been able to use a CPAP machine.  No fevers or chills. He denies having diabetes but reports having diabetic neuropathy. He is not on any blood thinners and has no known latex allergies.     Past Medical History:  Diagnosis Date   Anxiety    Chronic back pain    Degenerative disk disease    Depression    DVT of upper extremity (deep vein thrombosis) (HCC)    GERD (gastroesophageal reflux disease)    Hypertension    MRSA (methicillin resistant staph aureus) culture positive    PAD (peripheral artery disease)    Peripheral arterial disease    Peripheral neuropathy    Pilonidal cyst    Pruritus 12/23/2021   Pulmonary embolus (HCC)    no blood thinner 2002   Sleep apnea    can't use cpap due to deviated nasal septumg     Family History  Problem Relation Age of Onset   Hypertension Mother    Diabetes Mother     Hypertension Father    Heart disease Father    Pancreatic cancer Brother    Cancer Maternal Grandmother        breast, ovarian & colon   Stroke Paternal Grandfather    Neuropathy Neg Hx    Colon cancer Neg Hx    Esophageal cancer Neg Hx    Liver disease Neg Hx      Social History   Socioeconomic History   Marital status: Single    Spouse name: Not on file   Number of children: 2   Years of education: 12th    Highest education level: 12th grade  Occupational History   Occupation: [name]  Tobacco Use   Smoking status: Every Day    Current packs/day: 1.00    Average packs/day: 1 pack/day for 74.7 years (74.7 ttl pk-yrs)    Types: Cigarettes    Start date: 12/18/1982    Passive exposure: Never   Smokeless tobacco: Never   Tobacco comments:    1- 1.5 ppd   Vaping Use   Vaping status: Former  Substance and Sexual Activity   Alcohol use: Yes    Comment: Rarely   Drug use: No   Sexual activity: Not Currently    Birth control/protection: None  Other Topics  Concern   Not on file  Social History Narrative   Lives at home w/ his step-mother   Right-handed   Drinks about 2-3 Mt Dews a day   Social Drivers of Health   Financial Resource Strain: High Risk (08/29/2024)   Overall Financial Resource Strain (CARDIA)    Difficulty of Paying Living Expenses: Hard  Food Insecurity: Food Insecurity Present (08/29/2024)   Hunger Vital Sign    Worried About Running Out of Food in the Last Year: Sometimes true    Ran Out of Food in the Last Year: Often true  Transportation Needs: No Transportation Needs (08/29/2024)   PRAPARE - Administrator, Civil Service (Medical): No    Lack of Transportation (Non-Medical): No  Physical Activity: Insufficiently Active (08/29/2024)   Exercise Vital Sign    Days of Exercise per Week: 2 days    Minutes of Exercise per Session: 50 min  Stress: Stress Concern Present (08/29/2024)   [employer] of Occupational Health -  Occupational Stress Questionnaire    Feeling of Stress: Very much  Social Connections: Socially Isolated (08/29/2024)   Social Connection and Isolation Panel    Frequency of Communication with Friends and Family: Twice a week    Frequency of Social Gatherings with Friends and Family: Never    Attends Religious Services: Patient declined    Database Administrator or Organizations: No    Attends Engineer, Structural: Not on file    Marital Status: Divorced  Intimate Partner Violence: Not At Risk (08/03/2024)   Humiliation, Afraid, Rape, and Kick questionnaire    Fear of Current or Ex-Partner: No    Emotionally Abused: No    Physically Abused: No    Sexually Abused: No     Allergies  Allergen Reactions   Gabapentin  Other (See Comments)   Pollen Extract Cough   Dust Mite Extract Other (See Comments)   Other Other (See Comments)    Ragweed    Pregabalin  Swelling     Outpatient Medications Prior to Visit  Medication Sig Dispense Refill   Acetaminophen  (TYLENOL  ARTHRITIS PAIN PO) Take 600 mg by mouth 3 (three) times daily.     azelastine  (ASTELIN ) 0.1 % nasal spray Place 2 sprays into both nostrils 2 (two) times daily. Use in each nostril as directed 30 mL 12   losartan-hydrochlorothiazide  (HYZAAR) 50-12.5 MG tablet Take 1 tablet by mouth daily. 90 tablet 2   mirtazapine  (REMERON ) 15 MG tablet Take 1 tablet (15 mg total) by mouth at bedtime. 30 tablet 1   mupirocin  ointment (BACTROBAN ) 2 % Apply 1 Application topically daily. Apply to closed wounds on the back 22 g 0   oxyCODONE -acetaminophen  (PERCOCET) 10-325 MG tablet Take 1 tablet by mouth every 6 (six) hours as needed for pain.     vortioxetine HBr (TRINTELLIX) 10 MG TABS tablet Take 1 tablet (10 mg total) by mouth daily. 30 tablet 1   phentermine (ADIPEX-P) 37.5 MG tablet Take 1 tablet (37.5 mg total) by mouth daily before breakfast. (Patient not taking: Reported on 09/06/2024) 30 tablet 2   fluticasone  (FLONASE   SENSIMIST) 27.5 MCG/SPRAY nasal spray Place 2 sprays into the nose in the morning and at bedtime. 10 g 12   No facility-administered medications prior to visit.    ROS Reviewed all systems and reported negative except as above     Objective:   Vitals:   09/06/24 1035  BP: 128/84  Pulse: 76  SpO2: 97%  Weight: ROLLEN)  353 lb 6.4 oz (160.3 kg)  Height: 6' 5 (1.956 m)    Physical Exam Physical Exam GENERAL: Appropriate to age, no acute distress. HEAD EYES EARS NOSE THROAT: Moist mucous membranes, atraumatic, normocephalic. CHEST: Clear to auscultation bilaterally, no wheezing, no crackles, no rales CARDIAC: Regular rate and rhythm, normal S1, normal S2, no murmurs, no rubs, no gallops. ABDOMEN: Soft, nontender. NEUROLOGICAL: Motor and sensation grossly intact, alert and oriented times X 3. EXTREMITIES: Warm, well perfused, no edema.     CBC    Component Value Date/Time   WBC 9.8 07/31/2024 0928   WBC 10.4 06/21/2024 1555   RBC 4.89 07/31/2024 0928   RBC 4.57 06/21/2024 1555   HGB 15.4 07/31/2024 0928   HCT 45.7 07/31/2024 0928   PLT 195 07/31/2024 0928   MCV 94 07/31/2024 0928   MCH 31.5 07/31/2024 0928   MCH 31.3 06/21/2024 1555   MCHC 33.7 07/31/2024 0928   MCHC 34.9 06/21/2024 1555   RDW 15.0 07/31/2024 0928   LYMPHSABS 2.5 07/31/2024 0928   MONOABS 0.6 06/21/2024 1555   EOSABS 0.4 07/31/2024 0928   BASOSABS 0.1 07/31/2024 0928     Chest imaging:  PFT:    Latest Ref Rng & Units 12/25/2021    9:45 AM  PFT Results  FVC-Pre L 4.46   FVC-Predicted Pre % 75   FVC-Post L 4.49   FVC-Predicted Post % 75   Pre FEV1/FVC % % 76   Post FEV1/FCV % % 79   FEV1-Pre L 3.38   FEV1-Predicted Pre % 73   FEV1-Post L 3.57   DLCO uncorrected ml/min/mmHg 28.81   DLCO UNC% % 84   DLCO corrected ml/min/mmHg 28.34   DLCO COR %Predicted % 83   DLVA Predicted % 89   TLC L 7.46   TLC % Predicted % 92   RV % Predicted % 88     Labs:    Echo:       Assessment &  Plan:   Assessment and Plan Assessment & Plan Solitary pulmonary nodule, right lower lung, under evaluation Minimal size increase on CT scan suggests potential malignancy. Differential includes benign versus malignant etiology. - Ordered PET scan to assess metabolic activity of the nodule. - Consider biopsy if PET scan indicates activity. - Discuss results and further management via phone after PET scan.  Tobacco use disorder Chronic tobacco use disorder with current smoking of a pack, a pack and a half a day. Previous Wellbutrin  attempts unsuccessful. Chantix  contraindicated due to depression and anxiety. - Provided resources for smoking cessation, including nicotine  patches and lozenges. - Provided contact information for [place]  quit line.  Depression and anxiety Chronic depression and anxiety contraindicate Chantix  use for smoking cessation.  Night sweats localized to head Night sweats localized to the head, worsening over time. No systemic symptoms. No CPAP use due to intolerance.        Zola Herter, MD [facility] Office: [phone]

## 2024-09-19 ENCOUNTER — Encounter (HOSPITAL_BASED_OUTPATIENT_CLINIC_OR_DEPARTMENT_OTHER): Admitting: General Surgery

## 2024-09-19 DIAGNOSIS — G9009 Other idiopathic peripheral autonomic neuropathy: Secondary | ICD-10-CM | POA: Diagnosis not present

## 2024-09-19 DIAGNOSIS — J449 Chronic obstructive pulmonary disease, unspecified: Secondary | ICD-10-CM | POA: Insufficient documentation

## 2024-09-19 DIAGNOSIS — G4730 Sleep apnea, unspecified: Secondary | ICD-10-CM | POA: Diagnosis not present

## 2024-09-19 DIAGNOSIS — F17210 Nicotine dependence, cigarettes, uncomplicated: Secondary | ICD-10-CM | POA: Diagnosis not present

## 2024-09-19 DIAGNOSIS — L97522 Non-pressure chronic ulcer of other part of left foot with fat layer exposed: Secondary | ICD-10-CM | POA: Diagnosis present

## 2024-09-20 ENCOUNTER — Other Ambulatory Visit (HOSPITAL_COMMUNITY)

## 2024-09-25 ENCOUNTER — Encounter (HOSPITAL_COMMUNITY): Admission: RE | Admit: 2024-09-25 | Discharge: 2024-09-25

## 2024-09-25 DIAGNOSIS — R918 Other nonspecific abnormal finding of lung field: Secondary | ICD-10-CM

## 2024-09-25 LAB — GLUCOSE, CAPILLARY: Glucose-Capillary: 90 mg/dL (ref 70–99)

## 2024-09-25 MED ORDER — FLUDEOXYGLUCOSE F - 18 (FDG) INJECTION
16.0000 | Freq: Once | INTRAVENOUS | Status: AC
  Administered 2024-09-25: 15.85 via INTRAVENOUS

## 2024-09-28 ENCOUNTER — Ambulatory Visit

## 2024-09-28 DIAGNOSIS — L97512 Non-pressure chronic ulcer of other part of right foot with fat layer exposed: Secondary | ICD-10-CM | POA: Diagnosis not present

## 2024-09-28 DIAGNOSIS — F332 Major depressive disorder, recurrent severe without psychotic features: Secondary | ICD-10-CM | POA: Diagnosis not present

## 2024-09-28 DIAGNOSIS — L97522 Non-pressure chronic ulcer of other part of left foot with fat layer exposed: Secondary | ICD-10-CM | POA: Diagnosis not present

## 2024-09-28 DIAGNOSIS — I872 Venous insufficiency (chronic) (peripheral): Secondary | ICD-10-CM

## 2024-09-28 DIAGNOSIS — I10 Essential (primary) hypertension: Secondary | ICD-10-CM | POA: Diagnosis not present

## 2024-09-28 DIAGNOSIS — R600 Localized edema: Secondary | ICD-10-CM

## 2024-09-28 DIAGNOSIS — R911 Solitary pulmonary nodule: Secondary | ICD-10-CM

## 2024-09-28 DIAGNOSIS — F411 Generalized anxiety disorder: Secondary | ICD-10-CM | POA: Diagnosis not present

## 2024-09-28 MED ORDER — FUROSEMIDE 20 MG PO TABS: 20.0000 mg | ORAL_TABLET | Freq: Every day | ORAL | 0 refills | Status: AC | PRN

## 2024-09-28 MED ORDER — LOSARTAN POTASSIUM-HCTZ 100-25 MG PO TABS: 1.0000 | ORAL_TABLET | Freq: Every day | ORAL | 2 refills | Status: AC

## 2024-09-28 NOTE — Progress Notes (Signed)
 Established Patient Office Visit  Subjective   Patient ID: Eduardo [name], male    DOB: 09/19/1971  Age: 53 y.o. MRN: [id number]  Chief Complaint  Patient presents with   Medical Management of Chronic Issues    HPI   History of Present Illness   Eduardo [name] is a 53 year old male who presents for follow up on HTN.   Hypertension and orthostatic symptoms - Elevated blood pressure attributed to stress - Inconsistent home blood pressure monitoring since adding hydrochlorothiazide  to his regimen at last OV.  - Denies chest pain, headaches, or vision changes.   Peripheral edema - Worsening edema in both lower legs over the past few days - Previously treated with 5 day course of lasix , which was discontinued when no longer needed - Familiar with using Lasix  as needed based on symptoms   Care coordination and functional status - Multiple ongoing medical appointments with various specialists, including wound care, behavioral health, ENT, and infectious disease - Considering FMLA due to frequency of appointments and need for wound healing - Also currently undergoing work up of lung nodules with pulmonology to rule out / treat lung malignancy  - FMLA not yet approved; in process of obtaining paperwork from employer - Awaiting scan results before scheduling pulmonology follow-up         ROS Per HPI.    Objective:     BP (!) 146/80   Pulse 76   Temp 97.9 F (36.6 C) (Oral)   Ht 6' 5 (1.956 m)   Wt (!) 350 lb 0.6 oz (158.8 kg)   SpO2 96%   BMI 41.51 kg/m    Physical Exam Constitutional:      General: He is not in acute distress.    Appearance: Normal appearance.  Cardiovascular:     Rate and Rhythm: Normal rate and regular rhythm.     Heart sounds: Normal heart sounds. No murmur heard.    No friction rub. No gallop.  Pulmonary:     Effort: Pulmonary effort is normal. No respiratory distress.     Breath sounds: Normal breath sounds.  Musculoskeletal:      Right lower leg: Edema present.     Left lower leg: Edema present.  Skin:    General: Skin is warm and dry.  Neurological:     General: No focal deficit present.     Mental Status: He is alert.  Psychiatric:        Mood and Affect: Mood normal.        Behavior: Behavior normal.        Thought Content: Thought content normal.      No results found for any visits on 09/28/24.  Last CBC Lab Results  Component Value Date   WBC 9.8 07/31/2024   HGB 15.4 07/31/2024   HCT 45.7 07/31/2024   MCV 94 07/31/2024   MCH 31.5 07/31/2024   RDW 15.0 07/31/2024   PLT 195 07/31/2024   Last metabolic panel Lab Results  Component Value Date   GLUCOSE 116 (H) 07/31/2024   NA 137 07/31/2024   K 3.9 07/31/2024   CL 101 07/31/2024   CO2 23 07/31/2024   BUN 15 07/31/2024   CREATININE 1.08 07/31/2024   EGFR 81 07/31/2024   CALCIUM  8.9 07/31/2024   PROT 7.5 07/31/2024   ALBUMIN 4.1 07/31/2024   LABGLOB 3.4 07/31/2024   AGRATIO 1.5 03/16/2023   BILITOT 0.5 07/31/2024   ALKPHOS 125 (H) 07/31/2024   AST  13 07/31/2024   ALT 12 07/31/2024   ANIONGAP 12 06/21/2024   Last lipids Lab Results  Component Value Date   CHOL 96 (L) 07/31/2024   HDL 26 (L) 07/31/2024   LDLCALC 49 07/31/2024   LDLDIRECT 29 04/12/2020   TRIG 116 07/31/2024   CHOLHDL 3.7 07/31/2024   Last hemoglobin A1c Lab Results  Component Value Date   HGBA1C 4.7 (L) 07/31/2024   Last thyroid functions Lab Results  Component Value Date   TSH 1.830 07/31/2024   Last vitamin D  Lab Results  Component Value Date   VD25OH 24.2 (L) 07/31/2024      The ASCVD Risk score (Arnett DK, et al., 2019) failed to calculate for the following reasons:   The valid total cholesterol range is 130 to 320 mg/dL    Assessment & Plan:   Hypertension, unspecified type Assessment & Plan: BP goal <130/80. Above goal in office and on recheck today. Will double BP regimen to losartan -hydrochlorothiazide  100-25 mg daily. Advised to  take 2 of his current tablets and then pick up the increased dose. Given concurrent bilateral edema, also advised not to double dose of BP medication until after his 5 day course of Lasix .  Will need to avoid amlodipine  due to hx of edema. If BP continues not to meet goal, will need to add Spironolactone next.    Severe episode of recurrent major depressive disorder, without psychotic features [facility] Assessment & Plan: Patient had his first evaluation by psychiatry on 08-26-2024 and was started on Trintellix  and mirtazapine . Previously reported that he was doing well on the medications but today notes that he is not sure if he has been taking them. Dispense report shows he did not pick them up. Advised him to continue following up with psychiatry as scheduled. Will continue to monitor.    GAD (generalized anxiety disorder)  Bilateral lower extremity edema Assessment & Plan: Following with wound care due to swelling and chronic wounds on feet and lower legs. Provided 20 mg lasix  and advised to take once daily for 5 days to manage the swelling. Then advised to use as needed thereafter.   Lung nodule Assessment & Plan: Low-dose chest CT for lung cancer screening showed the following:     1. Lung-RADS 4B, suspicious. Additional imaging evaluation or consultation with Pulmonology or Thoracic Surgery recommended. Increasing part solid nodule in the right lower lobe with increasing ground-glass and solid components. 2. Remaining pulmonary nodules are stable. 3. Coronary artery calcifications. 4.  Emphysema (ICD10-J43.9)  Patient is currently following with pulmonology for further management. PET scan results pending follow up with pulmonology.   Stasis dermatitis of both legs Assessment & Plan: Lasix  20 mg every day for 5 days, then advised to use as needed. Nonadherant to compression stockings. Continue to elevate and follow with wound care.    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 His wound care specialist and I have both agreed that patient would benefit from [facility] from work so that he may remain off of his feet and appropriately allow these wounds to heal.    Other orders -     Losartan  Potassium-HCTZ; Take 1 tablet by mouth daily.  Dispense: 90 tablet; Refill: 2 -     Furosemide ; Take 1 tablet (20 mg total) by mouth daily as needed.  Dispense: 30 tablet; Refill: 0    Return  in about 8 weeks (around 11/23/2024) for HTN, swelling.    Saddie JULIANNA Sacks, PA-C

## 2024-09-28 NOTE — Assessment & Plan Note (Signed)
 Patient had his first evaluation by psychiatry on 08-26-2024 and was started on Trintellix  and mirtazapine . Previously reported that he was doing well on the medications but today notes that he is not sure if he has been taking them. Dispense report shows he did not pick them up. Advised him to continue following up with psychiatry as scheduled. Will continue to monitor.

## 2024-09-28 NOTE — Assessment & Plan Note (Signed)
 Continue with current management as recommended by wound care and regular follow-ups with wound care.  His wound care specialist and I have both agreed that patient would benefit from [facility] from work so that he may remain off of his feet and appropriately allow these wounds to heal.

## 2024-09-28 NOTE — Assessment & Plan Note (Signed)
 BP goal <130/80. Above goal in office and on recheck today. Will double BP regimen to losartan -hydrochlorothiazide  100-25 mg daily. Advised to take 2 of his current tablets and then pick up the increased dose. Given concurrent bilateral edema, also advised not to double dose of BP medication until after his 5 day course of Lasix .  Will need to avoid amlodipine  due to hx of edema. If BP continues not to meet goal, will need to add Spironolactone next.

## 2024-09-28 NOTE — Assessment & Plan Note (Signed)
 Following with wound care due to swelling and chronic wounds on feet and lower legs. Provided 20 mg lasix  and advised to take once daily for 5 days to manage the swelling. Then advised to use as needed thereafter.

## 2024-09-28 NOTE — Assessment & Plan Note (Signed)
 Low-dose chest CT for lung cancer screening showed the following:     1. Lung-RADS 4B, suspicious. Additional imaging evaluation or consultation with Pulmonology or Thoracic Surgery recommended. Increasing part solid nodule in the right lower lobe with increasing ground-glass and solid components. 2. Remaining pulmonary nodules are stable. 3. Coronary artery calcifications. 4.  Emphysema (ICD10-J43.9)  Patient is currently following with pulmonology for further management. PET scan results pending follow up with pulmonology.

## 2024-09-28 NOTE — Patient Instructions (Signed)
 VISIT SUMMARY: Today, we discussed your concerns about blood pressure management, swelling in your legs, and medication adherence. We also reviewed your chronic wounds and the need for ongoing care and potential time off work.  YOUR PLAN: CHRONIC WOUNDS: You have ongoing wounds that need proper care to heal. -Continue with your wound care management as directed. -Drop off the FMLA paperwork to take time off work for your treatment.  HYPERTENSION: Your blood pressure remains high, which can lead to complications if not managed properly. -We have increased your blood pressure medication dosage. -Monitor yourself for any symptoms of low blood pressure, such as dizziness or fainting. -Make sure to drink enough water to stay hydrated.  LOWER EXTREMITY EDEMA: You have swelling in your legs that has worsened recently. -Take Lasix  once a day for the next five days. You can use the lasix  as needed for increased swelling from there.  -After five days, we will reassess and adjust your blood pressure medication if necessary.  MAJOR DEPRESSIVE DISORDER, RECURRENT SEVERE WITHOUT PSYCHOTIC FEATURES: You have been diagnosed with major depressive disorder, but there has been some confusion about starting your medications. -Consider using prepackaged medications from [facility] to help with adherence. -We will hold off on starting new antidepressant medications until your follow up with behavioral health this month.  If you have any problems before your next visit feel free to message me via MyChart (minor issues or questions) or call the office, otherwise you may reach out to schedule an office visit.  Thank you! Saddie Sacks, PA-C

## 2024-09-28 NOTE — Assessment & Plan Note (Signed)
 Lasix  20 mg every day for 5 days, then advised to use as needed. Nonadherant to compression stockings. Continue to elevate and follow with wound care.

## 2024-09-30 ENCOUNTER — Ambulatory Visit: Payer: Self-pay

## 2024-09-30 DIAGNOSIS — R918 Other nonspecific abnormal finding of lung field: Secondary | ICD-10-CM

## 2024-10-03 ENCOUNTER — Other Ambulatory Visit: Payer: Self-pay

## 2024-10-04 ENCOUNTER — Encounter (HOSPITAL_BASED_OUTPATIENT_CLINIC_OR_DEPARTMENT_OTHER): Admitting: General Surgery

## 2024-10-04 DIAGNOSIS — L97522 Non-pressure chronic ulcer of other part of left foot with fat layer exposed: Secondary | ICD-10-CM | POA: Diagnosis not present

## 2024-10-05 ENCOUNTER — Telehealth: Payer: Self-pay

## 2024-10-05 NOTE — Telephone Encounter (Signed)
 Pt came by and dropped off FMLA paperwork.   $29.00 fee paid.  Placed in providers box.SABRA

## 2024-10-06 ENCOUNTER — Ambulatory Visit (INDEPENDENT_AMBULATORY_CARE_PROVIDER_SITE_OTHER): Admitting: Physician Assistant

## 2024-10-06 ENCOUNTER — Encounter (HOSPITAL_COMMUNITY): Payer: Self-pay | Admitting: Physician Assistant

## 2024-10-06 DIAGNOSIS — F411 Generalized anxiety disorder: Secondary | ICD-10-CM | POA: Diagnosis not present

## 2024-10-06 DIAGNOSIS — F332 Major depressive disorder, recurrent severe without psychotic features: Secondary | ICD-10-CM

## 2024-10-06 MED ORDER — MIRTAZAPINE 15 MG PO TABS: 15.0000 mg | ORAL_TABLET | Freq: Every day | ORAL | 1 refills | Status: DC

## 2024-10-06 MED ORDER — VORTIOXETINE HBR 20 MG PO TABS: 20.0000 mg | ORAL_TABLET | Freq: Every day | ORAL | 1 refills | Status: DC

## 2024-10-06 NOTE — Progress Notes (Signed)
 [facility] MD/PA/NP OP Progress Note  10/06/2024 7:22 PM BRIGHTON PILLEY  MRN:  [id number]  Chief Complaint:  Chief Complaint  Patient presents with   Follow-up   Medication Management   HPI:   Lenzy Kerschner is a 53 year old male with a past psychiatric history significant for generalized anxiety disorder and major depressive disorder (severe episode, recurrent, without psychotic features) who presents to [facility] for follow-up and medication management.  Patient is being managed on the following psychiatric medications:  Trintellix  10 mg daily Mirtazapine  15 mg at bedtime  Since the last encounter, patient reports that he cannot tell much of a difference in the use of his Trintellix  or mirtazapine .  Patient admits to not taking his mirtazapine  regularly due to his current job.  Patient states that he did not want to run the risk of being late/missing work due to possibly oversleeping from his use of mirtazapine .  Patient continues to endorse depression and rates his depression an 8 out of 10 with 10 being most severe.  Patient endorses depressive episodes every day.  Patient endorses the following depressive symptoms: feelings of sadness, lack of motivation, decreased concentration, decreased energy, and hopelessness.  Patient denies irritability or feelings of guilt/worthlessness.  Patient endorses anxiety and rates his anxiety a 9 out of 10.  Contributing factors to his depression and anxiety include being sick, being in constant pain, and not making any money.  Patient reports that he is trying to obtain disability.  Patient denies any other issues or concerns at this time.  A PHQ-9 screening was performed with the patient scoring a 22.  A GAD-7 screen was also performed with the patient scoring an 18.  Patient is alert and oriented x 4, calm, cooperative, and fully engaged in conversation during the encounter.  Patient endorses decent mood.   Patient exhibits depressed mood with congruent affect.  Patient denies suicidal or homicidal ideations.  He he further denies auditory or visual hallucinations and does not appear to be responding to internal/external stimuli.  Patient endorses poor sleep and receives on average 2 hours of sleep per night.  Patient endorses poor appetite and eats on average 1 meal per day.  Patient denies alcohol consumption or illicit drug use.  Patient endorses tobacco use and smokes on average a pack per day.  Visit Diagnosis:    ICD-10-CM   1. Severe episode of recurrent major depressive disorder, without psychotic features (HCC)  F33.2 mirtazapine  (REMERON ) 15 MG tablet    vortioxetine  HBr (TRINTELLIX ) 20 MG TABS tablet    2. GAD (generalized anxiety disorder)  F41.1 mirtazapine  (REMERON ) 15 MG tablet    vortioxetine  HBr (TRINTELLIX ) 20 MG TABS tablet      Past Psychiatric History:  Patient endorses a past psychiatric history significant for depression, anxiety, and ADHD.   Patient denies a past history of hospitalization due to mental health.   Patient denies a past history of suicide attempts.   Patient denies a past history of homicide attempt.  Past Medical History:  Past Medical History:  Diagnosis Date   Anxiety    Chronic back pain    Degenerative disk disease    Depression    DVT of upper extremity (deep vein thrombosis) (HCC)    GERD (gastroesophageal reflux disease)    Hypertension    MRSA (methicillin resistant staph aureus) culture positive    PAD (peripheral artery disease)    Peripheral arterial disease    Peripheral neuropathy  Pilonidal cyst    Pruritus 12/23/2021   Pulmonary embolus (HCC)    no blood thinner 2002   Sleep apnea    can't use cpap due to deviated nasal septumg    Past Surgical History:  Procedure Laterality Date   Irrigation and debridement of back abscess     MASS EXCISION Right 06/14/2019   Procedure: EXCISION BENIGN LESION RIGHT FOOT;  Surgeon:  Gretel Ozell PARAS, DPM;  Location: [facility];  Service: Podiatry;  Laterality: Right;   NASAL SEPTOPLASTY W/ TURBINOPLASTY [name] 02/03/2018   Procedure: NASAL SEPTOPLASTY WITH TURBINATE REDUCTION;  Surgeon: Roark Rush, MD;  Location: [facility] OR;  Service: ENT;  Laterality: [name];   PILONIDAL CYST DRAINAGE [name] 04/10/2017   Procedure: IRRIGATION AND DEBRIDEMENT BACK ABSCESS;  Surgeon: Sheldon Standing, MD;  Location: [facility];  Service: General;  Laterality: [name];   SINUS ENDO WITH FUSION [name] 02/03/2018   Procedure: ENDOSCOPIC SINUS SURGERY WITH FUSION;  Surgeon: Roark Rush, MD;  Location: [facility] OR;  Service: ENT;  Laterality: [name];   THORACIC OUTLET SURGERY  2000   TOE ARTHROPLASTY Right 06/14/2019   Procedure: HALLUX ARTHROPLASTY RIGHT FOOT WITH TISSUE TRANSER EXCISION OF SESAMOID BONE;  Surgeon: Gretel Ozell PARAS, DPM;  Location: [facility];  Service: Podiatry;  Laterality: Right;   WRIST FUSION  2002    Family Psychiatric History:  Patient denies a family history of psychiatric illness.   Family history of suicide attempt: Patient reports that his grandfather (paternal) attempted suicide. Family history of homicide attempt: Patient denies Family history of substance abuse: Patient denies  Family History:  Family History  Problem Relation Age of Onset   Hypertension Mother    Diabetes Mother    Hypertension Father    Heart disease Father    Pancreatic cancer Brother    Cancer Maternal Grandmother        breast, ovarian & colon   Stroke Paternal Grandfather    Neuropathy Neg Hx    Colon cancer Neg Hx    Esophageal cancer Neg Hx    Liver disease Neg Hx     Social History:  Social History   Socioeconomic History   Marital status: Single    Spouse name: Not on file   Number of children: 2   Years of education: 12th    Highest education level: 12th grade  Occupational History   Occupation: [name]  Tobacco Use   Smoking status: Every Day    Current packs/day: 1.00    Average packs/day: 1 pack/day for  74.8 years (74.8 ttl pk-yrs)    Types: Cigarettes    Start date: 12/18/1982    Passive exposure: Never   Smokeless tobacco: Never   Tobacco comments:    1- 1.5 ppd   Vaping Use   Vaping status: Former  Substance and Sexual Activity   Alcohol use: Yes    Comment: Rarely   Drug use: No   Sexual activity: Not Currently    Birth control/protection: None  Other Topics Concern   Not on file  Social History Narrative   Lives at home w/ his step-mother   Right-handed   Drinks about 2-3 Mt Dews a day   Social Drivers of Health   Tobacco Use: High Risk (10/06/2024)   Patient History    Smoking Tobacco Use: Every Day    Smokeless Tobacco Use: Never    Passive Exposure: Never  Financial Resource Strain: High Risk (08/29/2024)   Overall Financial Resource Strain (CARDIA)  Difficulty of Paying Living Expenses: Hard  Food Insecurity: Food Insecurity Present (08/29/2024)   Epic    Worried About Programme Researcher, Broadcasting/film/video in the Last Year: Sometimes true    [employer] of Food in the Last Year: Often true  Transportation Needs: No Transportation Needs (08/29/2024)   Epic    Lack of Transportation (Medical): No    Lack of Transportation (Non-Medical): No  Physical Activity: Insufficiently Active (08/29/2024)   Exercise Vital Sign    Days of Exercise per Week: 2 days    Minutes of Exercise per Session: 50 min  Stress: Stress Concern Present (08/29/2024)   [employer] of Occupational Health - Occupational Stress Questionnaire    Feeling of Stress: Very much  Social Connections: Socially Isolated (08/29/2024)   Social Connection and Isolation Panel    Frequency of Communication with Friends and Family: Twice a week    Frequency of Social Gatherings with Friends and Family: Never    Attends Religious Services: Patient declined    Database Administrator or Organizations: No    Attends Engineer, Structural: Not on file    Marital Status: Divorced  Depression (PHQ2-9): High Risk  (10/06/2024)   Depression (PHQ2-9)    PHQ-2 Score: 22  Alcohol Screen: Low Risk (08/29/2024)   Alcohol Screen    Last Alcohol Screening Score (AUDIT): 2  Housing: Unknown (08/29/2024)   Epic    Unable to Pay for Housing in the Last Year: No    Number of Times Moved in the Last Year: Not on file    Homeless in the Last Year: No  Recent Concern: Housing - High Risk (08/03/2024)   Epic    Unable to Pay for Housing in the Last Year: Yes    Number of Times Moved in the Last Year: Not on file    Homeless in the Last Year: No  Utilities: At Risk (08/03/2024)   Epic    Threatened with loss of utilities: Yes  Health Literacy: Inadequate Health Literacy (08/15/2024)   B1300 Health Literacy    Frequency of need for help with medical instructions: Often    Allergies: Allergies[1]  Metabolic Disorder Labs: Lab Results  Component Value Date   HGBA1C 4.7 (L) 07/31/2024   MPG 91 10/25/2014   No results found for: PROLACTIN Lab Results  Component Value Date   CHOL 96 (L) 07/31/2024   TRIG 116 07/31/2024   HDL 26 (L) 07/31/2024   CHOLHDL 3.7 07/31/2024   VLDL 36 10/25/2014   LDLCALC 49 07/31/2024   LDLCALC 45 03/16/2023   Lab Results  Component Value Date   TSH 1.830 07/31/2024   TSH 2.150 03/16/2023    Therapeutic Level Labs: No results found for: LITHIUM No results found for: VALPROATE No results found for: CBMZ  Current Medications: Current Outpatient Medications  Medication Sig Dispense Refill   Acetaminophen  (TYLENOL  ARTHRITIS PAIN PO) Take 600 mg by mouth 3 (three) times daily.     azelastine  (ASTELIN ) 0.1 % nasal spray Place 2 sprays into both nostrils 2 (two) times daily. Use in each nostril as directed 30 mL 12   furosemide  (LASIX ) 20 MG tablet Take 1 tablet (20 mg total) by mouth daily as needed. 30 tablet 0   losartan -hydrochlorothiazide  (HYZAAR) 100-25 MG tablet Take 1 tablet by mouth daily. 90 tablet 2   mirtazapine  (REMERON ) 15 MG tablet Take 1 tablet  (15 mg total) by mouth at bedtime. 30 tablet 1   mupirocin  ointment (BACTROBAN )  2 % Apply 1 Application topically daily. Apply to closed wounds on the back 22 g 0   oxyCODONE -acetaminophen  (PERCOCET) 10-325 MG tablet Take 1 tablet by mouth every 6 (six) hours as needed for pain.     vortioxetine  HBr (TRINTELLIX ) 20 MG TABS tablet Take 1 tablet (20 mg total) by mouth daily. 30 tablet 1   No current facility-administered medications for this visit.     Musculoskeletal: Strength & Muscle Tone: within normal limits Gait & Station: normal Patient leans: [name]  Psychiatric Specialty Exam: Review of Systems  Psychiatric/Behavioral:  Positive for dysphoric mood and sleep disturbance. Negative for decreased concentration, hallucinations, self-injury and suicidal ideas. The patient is nervous/anxious. The patient is not hyperactive.     Blood pressure (!) 129/94, pulse 78, temperature 98.1 F (36.7 C), temperature source Oral, height 6' 5 (1.956 m), weight (!) 349 lb 3.2 oz (158.4 kg), SpO2 98%.Body mass index is 41.41 kg/m.  General Appearance: Casual  Eye Contact:  Good  Speech:  Clear and Coherent and Normal Rate  Volume:  Normal  Mood:  Anxious and Depressed  Affect:  Congruent  Thought Process:  Coherent, Goal Directed, and Descriptions of Associations: Intact  Orientation:  Full (Time, Place, and Person)  Thought Content: WDL   Suicidal Thoughts:  No  Homicidal Thoughts:  No  Memory:  Immediate;   Good Recent;   Fair Remote;   Fair  Judgement:  Good  Insight:  Good  Psychomotor Activity:  Normal  Concentration:  Concentration: Good and Attention Span: Good  Recall:  Good  Fund of Knowledge: Good  Language: Good  Akathisia:  No  Handed:  Right  AIMS (if indicated): not done  Assets:  Communication Skills Desire for Improvement Housing Social Support Transportation Vocational/Educational  ADL's:  Intact  Cognition: WNL  Sleep:  Poor   Screenings: AUDIT    Flowsheet  Row [facility]  Alcohol Use Disorder Identification Test Final Score (AUDIT) 0   GAD-7    Flowsheet Row Clinical Support from 10/06/2024 in [facility] Office Visit from 09/28/2024 in [facility] Office Visit from [facility]20[facility] from 08/15/2024 in [facility] Office Visit from 08/03/2024 in [facility] at [facility]  Total GAD-7 Score 18 19 12 12 12    PHQ2-9    Flowsheet Row Clinical Support from 10/06/2024 in [facility] Office Visit from 09/28/2024 in [facility] at [facility] Office Visit from 08/29/2024 in [facility] at [facility] Office Visit from 08/25/2024 in [facility] Counselor from 08/15/2024 in [facility]  PHQ-2 Total Score 6 6 5 3 6   PHQ-9 Total Score 22 18 16 15 22    Flowsheet Row Clinical Support from 10/06/2024 in [facility] Office Visit from 08/25/2024 in [facility] Counselor from 08/15/2024 in [facility]  C-SSRS RISK CATEGORY Moderate Risk Low Risk Moderate Risk     Assessment and Plan:   Refoel Palladino is a 53 year old male with a past psychiatric history significant for generalized anxiety disorder and major depressive disorder (severe episode, recurrent, without psychotic features) who presents to [facility] for follow-up and medication management.  Patient presents to the encounter stating that he has not noticed  much of a difference from his use of Trintellix  or mirtazapine .  He does admit to not taking mirtazapine  regularly out of fear of oversleeping and missing/being late to his job.  He continues to  endorse worsening depression as well as anxiety attributed to stressors in his life.  A PHQ-9 screen was performed with the patient scoring a 22.  A GAD-7 screen was also performed with the patient scoring an 18.  Patient is no longer working at this time and is trying to apply for disability.  Provider encouraged patient to continue with taking mirtazapine  15 mg at bedtime for management of his depressive symptoms, anxiety, and for sleep.  Patient vocalized understanding.  Patient was recommended increasing his Trintellix  dosage from 10 mg to 20 mg daily for the management of his depressive symptoms and anxiety.  Patient was agreeable to recommendation.  Patient's medications to be e-prescribed to pharmacy of choice.  A [place] Suicide Severity Rating Scale was performed with the patient being considered moderate risk.  Patient denies suicidal ideations and is able to contract for safety at this time.  Safety planning was discussed with the patient prior to the conclusion of the encounter.  - Patient was instructed to contact 911 in the event of a mental health crisis. - Patient was instructed to contact [facility] Su[facility] Crisis [facility] in the event of a mental health crisis. - Patient was instructed to present to [facility] in the event of a mental health crisis.  Collaboration of Care: Collaboration of Care: Medication Management AEB provider managing patient's psychiatric medications, Primary Care Provider AEB patient being seen by family medicine, Psychiatrist AEB patient being followed by a mental health provider at this facility, and Other provider involved in patient's care AEB patient being seen by infectious disease, otolaryngology, and wound care  Patient/Guardian was advised Release of Information must be obtained prior to any record release in order to collaborate their care with an outside provider. Patient/Guardian was advised if they have not already done  so to contact the registration department to sign all necessary forms in order for us  to release information regarding their care.   Consent: Patient/Guardian gives verbal consent for treatment and assignment of benefits for services provided during this visit. Patient/Guardian expressed understanding and agreed to proceed.   1. Severe episode of recurrent major depressive disorder, without psychotic features (HCC)  - mirtazapine  (REMERON ) 15 MG tablet; Take 1 tablet (15 mg total) by mouth at bedtime.  Dispense: 30 tablet; Refill: 1 - vortioxetine  HBr (TRINTELLIX ) 20 MG TABS tablet; Take 1 tablet (20 mg total) by mouth daily.  Dispense: 30 tablet; Refill: 1  2. GAD (generalized anxiety disorder)  - mirtazapine  (REMERON ) 15 MG tablet; Take 1 tablet (15 mg total) by mouth at bedtime.  Dispense: 30 tablet; Refill: 1 - vortioxetine  HBr (TRINTELLIX ) 20 MG TABS tablet; Take 1 tablet (20 mg total) by mouth daily.  Dispense: 30 tablet; Refill: 1  Patient to follow-up in 6 weeks Provider spent a total of 19 minutes with the patient/reviewing patient's chart  Reginia FORBES Bolster, PA 10/06/2024, 7:22 PM     [1]  Allergies Allergen Reactions   Gabapentin  Other (See Comments)   Pollen Extract Cough   Dust Mite Extract Other (See Comments)   Other Other (See Comments)    Ragweed    Pregabalin  Swelling

## 2024-10-09 NOTE — Telephone Encounter (Signed)
 Paperwork has been faxed and confirmation received.   Copy is in folder for him to pick up

## 2024-10-11 ENCOUNTER — Encounter (HOSPITAL_BASED_OUTPATIENT_CLINIC_OR_DEPARTMENT_OTHER): Admitting: General Surgery

## 2024-10-11 DIAGNOSIS — L97522 Non-pressure chronic ulcer of other part of left foot with fat layer exposed: Secondary | ICD-10-CM | POA: Diagnosis not present

## 2024-10-18 ENCOUNTER — Ambulatory Visit (INDEPENDENT_AMBULATORY_CARE_PROVIDER_SITE_OTHER): Admitting: Otolaryngology

## 2024-10-18 ENCOUNTER — Encounter (HOSPITAL_BASED_OUTPATIENT_CLINIC_OR_DEPARTMENT_OTHER): Admitting: General Surgery

## 2024-10-18 DIAGNOSIS — L97522 Non-pressure chronic ulcer of other part of left foot with fat layer exposed: Secondary | ICD-10-CM | POA: Diagnosis not present

## 2024-10-23 ENCOUNTER — Ambulatory Visit: Admitting: Internal Medicine

## 2024-10-23 NOTE — Telephone Encounter (Signed)
 Pt stopped by and dropped off another packet of FMLA paperwork.  I have placed this in the providers box.   Please let us  know if another fee is needs to be collected.

## 2024-10-24 ENCOUNTER — Other Ambulatory Visit: Payer: Self-pay

## 2024-10-24 NOTE — Telephone Encounter (Signed)
 Pt called and I informed him that since this paperwork has been completed 2 times and they are wanting something different, they he will need to check with them to see exactly what they are looking for. Pt said he would call HR and check with them.

## 2024-10-25 ENCOUNTER — Other Ambulatory Visit: Payer: Self-pay

## 2024-10-27 ENCOUNTER — Encounter (HOSPITAL_BASED_OUTPATIENT_CLINIC_OR_DEPARTMENT_OTHER): Attending: General Surgery | Admitting: General Surgery

## 2024-10-27 DIAGNOSIS — Z720 Tobacco use: Secondary | ICD-10-CM | POA: Insufficient documentation

## 2024-10-27 DIAGNOSIS — G9009 Other idiopathic peripheral autonomic neuropathy: Secondary | ICD-10-CM | POA: Diagnosis not present

## 2024-10-27 DIAGNOSIS — Z6841 Body Mass Index (BMI) 40.0 and over, adult: Secondary | ICD-10-CM | POA: Diagnosis not present

## 2024-10-27 DIAGNOSIS — L97522 Non-pressure chronic ulcer of other part of left foot with fat layer exposed: Secondary | ICD-10-CM | POA: Diagnosis present

## 2024-10-27 DIAGNOSIS — I872 Venous insufficiency (chronic) (peripheral): Secondary | ICD-10-CM | POA: Insufficient documentation

## 2024-11-03 ENCOUNTER — Encounter (HOSPITAL_BASED_OUTPATIENT_CLINIC_OR_DEPARTMENT_OTHER): Admitting: General Surgery

## 2024-11-03 DIAGNOSIS — L97522 Non-pressure chronic ulcer of other part of left foot with fat layer exposed: Secondary | ICD-10-CM | POA: Diagnosis not present

## 2024-11-10 ENCOUNTER — Encounter (HOSPITAL_BASED_OUTPATIENT_CLINIC_OR_DEPARTMENT_OTHER): Admitting: General Surgery

## 2024-11-10 DIAGNOSIS — L97522 Non-pressure chronic ulcer of other part of left foot with fat layer exposed: Secondary | ICD-10-CM | POA: Diagnosis not present

## 2024-11-15 ENCOUNTER — Ambulatory Visit (INDEPENDENT_AMBULATORY_CARE_PROVIDER_SITE_OTHER): Admitting: Physician Assistant

## 2024-11-15 ENCOUNTER — Encounter (HOSPITAL_COMMUNITY): Payer: Self-pay | Admitting: Physician Assistant

## 2024-11-15 DIAGNOSIS — F411 Generalized anxiety disorder: Secondary | ICD-10-CM | POA: Diagnosis not present

## 2024-11-15 DIAGNOSIS — F332 Major depressive disorder, recurrent severe without psychotic features: Secondary | ICD-10-CM

## 2024-11-15 MED ORDER — MIRTAZAPINE 15 MG PO TABS: 15.0000 mg | ORAL_TABLET | Freq: Every day | ORAL | 1 refills | Status: AC

## 2024-11-15 NOTE — Progress Notes (Unsigned)
 [facility] MD/PA/NP OP Progress Note  11/15/2024 12:40 PM Eduardo [name]  MRN:  [id number]  Chief Complaint:  Chief Complaint  Patient presents with   Follow-up   Medication Refill   HPI:   Eduardo [name] is a 54 year old male with a past psychiatric history significant for generalized anxiety disorder and major depressive disorder (severe episode, recurrent, without psychotic features) who presents to [facility] for follow-up and medication management.  Patient is being managed on the following psychiatric medications:  Trintellix  10 mg daily Mirtazapine  15 mg at bedtime  Patient presents to the encounter stating that he has been taking his medications regularly.  He reports that he has been experiencing breaking out in sweats and is unsure if his medications are the direct cause of the symptom.  He states that the sweating especially occurs at night and he does not have to be doing anything strenuous for it to occur.  In regards to his sleep, patient reports that the addition of Remeron  has been helpful.  Patient endorses sleeping an hour at a time but states that he is able to fall asleep relatively quickly.  He reports that he often wakes up during the night due to using the bathroom or being in pain.  Patient endorses urinating about a half a gallon to a gallon per night.  Patient continues to endorse depression and rates his depression a 10 out of 10 with 10 being most severe.  Patient endorses depressive episodes 5 days/week.  Patient endorses the following depressive symptoms: feelings of sadness, decreased concentration, decreased energy, and hopelessness.  Patient denies lack of motivation or feelings of worthlessness or guilt.  Patient also endorses anxiety and rates his anxiety an 8 out of 10.  Patient endorses stressors revolving around recently losing his job and accruing bills.  A PHQ-9 screen was performed with the patient scoring a 14.   A GAD-7 screen was also performed with the patient scoring an 18.  Patient is alert and oriented x 4, calm, cooperative, and fully engaged in conversation during the encounter.  Patient describes his mood as numb to everything.  Patient exhibits depressed mood with congruent affect.  Patient denies suicidal or homicidal ideations.  He further denies auditory or visual hallucinations and does not appear to be responding to internal/external stimuli.  Patient endorses poor sleep and receives on average 3 hours of sleep per night.  Patient endorses fair appetite and eats on average 2 meals per day.  Patient denies alcohol consumption or illicit drug use.  Patient endorses tobacco use and smokes about a pack per day.  Visit Diagnosis:    ICD-10-CM   1. Severe episode of recurrent major depressive disorder, without psychotic features (HCC)  F33.2 mirtazapine  (REMERON ) 15 MG tablet    2. GAD (generalized anxiety disorder)  F41.1 mirtazapine  (REMERON ) 15 MG tablet      Past Psychiatric History:  Patient endorses a past psychiatric history significant for depression, anxiety, and ADHD.   Patient denies a past history of hospitalization due to mental health.   Patient denies a past history of suicide attempts.   Patient denies a past history of homicide attempt.  Past Medical History:  Past Medical History:  Diagnosis Date   Anxiety    Chronic back pain    Degenerative disk disease    Depression    DVT of upper extremity (deep vein thrombosis) (HCC)    GERD (gastroesophageal reflux disease)    Hypertension  MRSA (methicillin resistant staph aureus) culture positive    PAD (peripheral artery disease)    Peripheral arterial disease    Peripheral neuropathy    Pilonidal cyst    Pruritus 12/23/2021   Pulmonary embolus (HCC)    no blood thinner 2002   Sleep apnea    can't use cpap due to deviated nasal septumg    Past Surgical History:  Procedure Laterality Date   Irrigation and  debridement of back abscess     MASS EXCISION Right 06/14/2019   Procedure: EXCISION BENIGN LESION RIGHT FOOT;  Surgeon: Gretel Ozell PARAS, DPM;  Location: [facility];  Service: Podiatry;  Laterality: Right;   NASAL SEPTOPLASTY W/ TURBINOPLASTY [name] 02/03/2018   Procedure: NASAL SEPTOPLASTY WITH TURBINATE REDUCTION;  Surgeon: Roark Rush, MD;  Location: [facility];  Service: ENT;  Laterality: [name];   PILONIDAL CYST DRAINAGE [name] 04/10/2017   Procedure: IRRIGATION AND DEBRIDEMENT BACK ABSCESS;  Surgeon: Sheldon Standing, MD;  Location: [facility];  Service: General;  Laterality: [name];   SINUS ENDO WITH FUSION [name] 02/03/2018   Procedure: ENDOSCOPIC SINUS SURGERY WITH FUSION;  Surgeon: Roark Rush, MD;  Location: [facility];  Service: ENT;  Laterality: [name];   THORACIC OUTLET SURGERY  2000   TOE ARTHROPLASTY Right 06/14/2019   Procedure: HALLUX ARTHROPLASTY RIGHT FOOT WITH TISSUE TRANSER EXCISION OF SESAMOID BONE;  Surgeon: Gretel Ozell PARAS, DPM;  Location: [facility];  Service: Podiatry;  Laterality: Right;   WRIST FUSION  2002    Family Psychiatric History:  Patient denies a family history of psychiatric illness.   Family history of suicide attempt: Patient reports that his grandfather (paternal) attempted suicide. Family history of homicide attempt: Patient denies Family history of substance abuse: Patient denies  Family History:  Family History  Problem Relation Age of Onset   Hypertension Mother    Diabetes Mother    Hypertension Father    Heart disease Father    Pancreatic cancer Brother    Cancer Maternal Grandmother        breast, ovarian & colon   Stroke Paternal Grandfather    Neuropathy Neg Hx    Colon cancer Neg Hx    Esophageal cancer Neg Hx    Liver disease Neg Hx     Social History:  Social History   Socioeconomic History   Marital status: Single    Spouse name: Not on file   Number of children: 2   Years of education: 12th    Highest education level: 12th grade  Occupational History    Occupation: [name]  Tobacco Use   Smoking status: Every Day    Current packs/day: 1.00    Average packs/day: 1 pack/day for 74.9 years (74.9 ttl pk-yrs)    Types: Cigarettes    Start date: 12/18/1982    Passive exposure: Never   Smokeless tobacco: Never   Tobacco comments:    1- 1.5 ppd   Vaping Use   Vaping status: Former  Substance and Sexual Activity   Alcohol use: Yes    Comment: Rarely   Drug use: No   Sexual activity: Not Currently    Birth control/protection: None  Other Topics Concern   Not on file  Social History Narrative   Lives at home w/ his step-mother   Right-handed   Drinks about 2-3 Mt Dews a day   Social Drivers of Health   Tobacco Use: High Risk (11/15/2024)   Patient History    Smoking Tobacco Use: Every Day  Smokeless Tobacco Use: Never    Passive Exposure: Never  Financial Resource Strain: High Risk (08/29/2024)   Overall Financial Resource Strain (CARDIA)    Difficulty of Paying Living Expenses: Hard  Food Insecurity: Food Insecurity Present (08/29/2024)   Epic    Worried About Programme Researcher, Broadcasting/film/video in the Last Year: Sometimes true    [employer] of Food in the Last Year: Often true  Transportation Needs: No Transportation Needs (08/29/2024)   Epic    Lack of Transportation (Medical): No    Lack of Transportation (Non-Medical): No  Physical Activity: Insufficiently Active (08/29/2024)   Exercise Vital Sign    Days of Exercise per Week: 2 days    Minutes of Exercise per Session: 50 min  Stress: Stress Concern Present (08/29/2024)   [employer] of Occupational Health - Occupational Stress Questionnaire    Feeling of Stress: Very much  Social Connections: Socially Isolated (08/29/2024)   Social Connection and Isolation Panel    Frequency of Communication with Friends and Family: Twice a week    Frequency of Social Gatherings with Friends and Family: Never    Attends Religious Services: Patient declined    Database Administrator or Organizations:  No    Attends Engineer, Structural: Not on file    Marital Status: Divorced  Depression (PHQ2-9): High Risk (11/15/2024)   Depression (PHQ2-9)    PHQ-2 Score: 14  Alcohol Screen: Low Risk (08/29/2024)   Alcohol Screen    Last Alcohol Screening Score (AUDIT): 2  Housing: Unknown (08/29/2024)   Epic    Unable to Pay for Housing in the Last Year: No    Number of Times Moved in the Last Year: Not on file    Homeless in the Last Year: No  Recent Concern: Housing - High Risk (08/03/2024)   Epic    Unable to Pay for Housing in the Last Year: Yes    Number of Times Moved in the Last Year: Not on file    Homeless in the Last Year: No  Utilities: At Risk (08/03/2024)   Epic    Threatened with loss of utilities: Yes  Health Literacy: Inadequate Health Literacy (08/15/2024)   B1300 Health Literacy    Frequency of need for help with medical instructions: Often    Allergies: Allergies[1]  Metabolic Disorder Labs: Lab Results  Component Value Date   HGBA1C 4.7 (L) 07/31/2024   MPG 91 10/25/2014   No results found for: PROLACTIN Lab Results  Component Value Date   CHOL 96 (L) 07/31/2024   TRIG 116 07/31/2024   HDL 26 (L) 07/31/2024   CHOLHDL 3.7 07/31/2024   VLDL 36 10/25/2014   LDLCALC 49 07/31/2024   LDLCALC 45 03/16/2023   Lab Results  Component Value Date   TSH 1.830 07/31/2024   TSH 2.150 03/16/2023    Therapeutic Level Labs: No results found for: LITHIUM No results found for: VALPROATE No results found for: CBMZ  Current Medications: Current Outpatient Medications  Medication Sig Dispense Refill   Acetaminophen  (TYLENOL  ARTHRITIS PAIN PO) Take 600 mg by mouth 3 (three) times daily.     azelastine  (ASTELIN ) 0.1 % nasal spray Place 2 sprays into both nostrils 2 (two) times daily. Use in each nostril as directed 30 mL 12   furosemide  (LASIX ) 20 MG tablet Take 1 tablet (20 mg total) by mouth daily as needed. 30 tablet 0   losartan -hydrochlorothiazide   (HYZAAR) 100-25 MG tablet Take 1 tablet by mouth daily. 90  tablet 2   mirtazapine  (REMERON ) 15 MG tablet Take 1 tablet (15 mg total) by mouth at bedtime. 30 tablet 1   mupirocin  ointment (BACTROBAN ) 2 % Apply 1 Application topically daily. Apply to closed wounds on the back 22 g 0   oxyCODONE -acetaminophen  (PERCOCET) 10-325 MG tablet Take 1 tablet by mouth every 6 (six) hours as needed for pain.     No current facility-administered medications for this visit.     Musculoskeletal: Strength & Muscle Tone: within normal limits Gait & Station: normal Patient leans: [name]  Psychiatric Specialty Exam: Review of Systems  Psychiatric/Behavioral:  Positive for dysphoric mood and sleep disturbance. Negative for decreased concentration, hallucinations, self-injury and suicidal ideas. The patient is nervous/anxious. The patient is not hyperactive.     Blood pressure 130/82, pulse 87, temperature 97.8 F (36.6 C), temperature source Oral, height 6' 5 (1.956 m), weight (!) 341 lb 12.8 oz (155 kg), SpO2 98%.Body mass index is 40.53 kg/m.  General Appearance: Casual  Eye Contact:  Good  Speech:  Clear and Coherent and Normal Rate  Volume:  Normal  Mood:  Anxious and Depressed  Affect:  Congruent  Thought Process:  Coherent, Goal Directed, and Descriptions of Associations: Intact  Orientation:  Full (Time, Place, and Person)  Thought Content: WDL   Suicidal Thoughts:  No  Homicidal Thoughts:  No  Memory:  Immediate;   Good Recent;   Fair Remote;   Fair  Judgement:  Good  Insight:  Good  Psychomotor Activity:  Normal  Concentration:  Concentration: Good and Attention Span: Good  Recall:  Good  Fund of Knowledge: Good  Language: Good  Akathisia:  No  Handed:  Right  AIMS (if indicated): not done  Assets:  Communication Skills Desire for Improvement Housing Social Support Transportation Vocational/Educational  ADL's:  Intact  Cognition: WNL  Sleep:  Poor   Screenings: AUDIT     Flowsheet Row [facility]  Alcohol Use Disorder Identification Test Final Score (AUDIT) 0   GAD-7    Flowsheet Row Clinical Support from 11/15/2024 in [facility] Clinical Support from 10/06/2024 in [facility] Office Visit from 09/28/2024 in [facility] Office Visit from 08/25/2024 in [facility] Counselor from 08/15/2024 in [facility]  Total GAD-7 Score 18 18 19 12 12    PHQ2-9    Flowsheet Row Clinical Support from 11/15/2024 in [facility] Clinical Support from 10/06/2024 in [facility] Office Visit from 09/28/2024 in [facility] at [facility] Office Visit from 08/29/2024 in [facility] Primary Care at [facility] Office Visit from 08/25/2024 in [facility]  PHQ-2 Total Score 4 6 6 5 3   PHQ-9 Total Score 14 22 18 16 15    Flowsheet Row Clinical Support from 11/15/2024 in [facility] Clinical Support from 10/06/2024 in [facility] Office Visit from 08/25/2024 in [facility]  C-SSRS RISK CATEGORY Moderate Risk Moderate Risk Low Risk     Assessment and Plan:   Jackob Crookston is a 54 year old male with a past psychiatric history significant for generalized anxiety disorder and major depressive disorder (severe episode, recurrent, without psychotic features) who presents to [facility] for follow-up and medication management.  Patient presents to the encounter stating  that he has been taking his medications regularly.  He reports that he has been experiencing episodes of sweating that especially occur at night.  He reports that he does not to be doing  anything strenuous for the sweating to occur.  Patient believes that sweating may be attributed to medications his other provider is providing him with.  Patient presents to the encounter continuing to endorse ongoing depression and anxiety attributed to recently losing his job and his accruing bills.  A PHQ-9 screen was performed with the patient scoring a 14.  The GAD-7 screen was also performed with the patient scoring an 18.  Patient reports that he has noticed improvements in his sleep since taking mirtazapine .  Though his sleep is still poor, patient reports that whenever he wakes up, he is able to fall back asleep more quickly.  Patient states that he has not noticed much of a difference in taking Trintellix  and would like to discontinue taking the medication.  Patient was instructed to take Trintellix  10 mg (half tablet) for the next 7 days before discontinuing the medication to avoid withdrawal symptoms.  Patient vocalized understanding.  Patient was agreeable to continue taking mirtazapine  at the current dose.  Patient's medication to be e-prescribed to pharmacy of choice.  A [place] Suicide Severity Rating Scale was performed with the patient being considered moderate risk.  Patient denies suicidal ideations and is able to contract for safety at this time.  Safety planning was discussed with the patient prior to the conclusion of the encounter.  - Patient was instructed to contact 911 in the event of a mental health crisis. - Patient was instructed to contact [facility] in the event of a mental health crisis. - Patient was instructed to present to [facility] in the event of a mental health crisis.  Collaboration of Care: Collaboration of Care: Medication Management AEB provider managing patient's psychiatric medications, Primary Care Provider AEB patient being seen by family medicine, Psychiatrist AEB patient being followed by a mental  health provider at this facility, and Other provider involved in patient's care AEB patient being seen by infectious disease, otolaryngology, and wound care  Patient/Guardian was advised Release of Information must be obtained prior to any record release in order to collaborate their care with an outside provider. Patient/Guardian was advised if they have not already done so to contact the registration department to sign all necessary forms in order for us  to release information regarding their care.   Consent: Patient/Guardian gives verbal consent for treatment and assignment of benefits for services provided during this visit. Patient/Guardian expressed understanding and agreed to proceed.   1. Severe episode of recurrent major depressive disorder, without psychotic features (HCC)  - mirtazapine  (REMERON ) 15 MG tablet; Take 1 tablet (15 mg total) by mouth at bedtime.  Dispense: 30 tablet; Refill: 1  2. GAD (generalized anxiety disorder)  - mirtazapine  (REMERON ) 15 MG tablet; Take 1 tablet (15 mg total) by mouth at bedtime.  Dispense: 30 tablet; Refill: 1  Patient to follow-up in 6 weeks Provider spent a total of 24 minutes with the patient/reviewing patient's chart  Reginia FORBES Bolster, PA 11/15/2024, 12:40 PM      [1]  Allergies Allergen Reactions   Gabapentin  Other (See Comments)   Pollen Extract Cough   Dust Mite Extract Other (See Comments)   Other Other (See Comments)    Ragweed    Pregabalin  Swelling

## 2024-11-17 ENCOUNTER — Encounter (HOSPITAL_COMMUNITY): Payer: Self-pay | Admitting: Physician Assistant

## 2024-11-17 ENCOUNTER — Encounter (HOSPITAL_BASED_OUTPATIENT_CLINIC_OR_DEPARTMENT_OTHER): Admitting: General Surgery

## 2024-11-23 ENCOUNTER — Ambulatory Visit

## 2024-11-23 ENCOUNTER — Telehealth: Payer: Self-pay

## 2024-11-23 VITALS — BP 134/81 | HR 73 | Temp 97.8°F | Ht 77.0 in | Wt 351.0 lb

## 2024-11-23 DIAGNOSIS — I872 Venous insufficiency (chronic) (peripheral): Secondary | ICD-10-CM | POA: Diagnosis not present

## 2024-11-23 DIAGNOSIS — F5104 Psychophysiologic insomnia: Secondary | ICD-10-CM | POA: Diagnosis not present

## 2024-11-23 DIAGNOSIS — F17200 Nicotine dependence, unspecified, uncomplicated: Secondary | ICD-10-CM | POA: Diagnosis not present

## 2024-11-23 DIAGNOSIS — F332 Major depressive disorder, recurrent severe without psychotic features: Secondary | ICD-10-CM

## 2024-11-23 DIAGNOSIS — L97522 Non-pressure chronic ulcer of other part of left foot with fat layer exposed: Secondary | ICD-10-CM | POA: Diagnosis not present

## 2024-11-23 DIAGNOSIS — I700 Atherosclerosis of aorta: Secondary | ICD-10-CM | POA: Diagnosis not present

## 2024-11-23 DIAGNOSIS — L97512 Non-pressure chronic ulcer of other part of right foot with fat layer exposed: Secondary | ICD-10-CM | POA: Diagnosis not present

## 2024-11-23 DIAGNOSIS — I10 Essential (primary) hypertension: Secondary | ICD-10-CM | POA: Diagnosis not present

## 2024-11-23 DIAGNOSIS — G4733 Obstructive sleep apnea (adult) (pediatric): Secondary | ICD-10-CM

## 2024-11-23 DIAGNOSIS — R911 Solitary pulmonary nodule: Secondary | ICD-10-CM

## 2024-11-23 DIAGNOSIS — Z599 Problem related to housing and economic circumstances, unspecified: Secondary | ICD-10-CM

## 2024-11-23 MED ORDER — NICOTINE 21 MG/24HR TD PT24: 21.0000 mg | MEDICATED_PATCH | Freq: Every day | TRANSDERMAL | 1 refills | Status: AC

## 2024-11-23 NOTE — Assessment & Plan Note (Signed)
 Improved with mirtazapine  15 mg nightly. Continue this medication as prescribed.

## 2024-11-23 NOTE — Patient Instructions (Signed)
 VISIT SUMMARY: During your visit, we discussed your blood pressure management, foot wound care, smoking cessation, sleep disturbance, and cardiovascular health. We also addressed your concerns about gastrointestinal and dermatologic symptoms, nasal congestion, and occupational and financial issues.  YOUR PLAN: CHRONIC LEFT FOOT ULCER WITH INFECTION: You have a chronic ulcer on your left foot that is currently infected. -Continue taking Augmentin  as prescribed. -Attend your wound care appointment tomorrow. -You have been referred to a social worker for assistance with work leave and short-term disability.  NICOTINE  DEPENDENCE, CIGARETTES: You have reduced your smoking and are interested in quitting. -You have been prescribed nicotine  patches starting at 21 mg. -We will reassess the nicotine  patch dosage in 6-8 weeks.  HYPERTENSION: Your blood pressure is well-controlled without regular medication, possibly due to reduced stress. -Continue monitoring your blood pressure at home. -Keep your antihypertensive medication available for use if needed.  INSOMNIA: Your sleep is improving with mirtazapine , though you experience some grogginess and pain-related awakenings. -Continue taking mirtazapine  for insomnia.  ATHEROSCLEROSIS OF AORTA: You have significant plaque buildup in your aorta, though your cholesterol levels are normal. -Continue efforts to quit smoking. -We will consider cholesterol medication if the plaque does not improve with lifestyle changes.  If you have any problems before your next visit feel free to message me via MyChart (minor issues or questions) or call the office, otherwise you may reach out to schedule an office visit.  Thank you! Saddie Sacks, PA-C

## 2024-11-23 NOTE — Progress Notes (Signed)
 Complex Care Management Note  Care Guide Note 11/23/2024 Name: Eduardo [name] MRN: [id number] DOB: December 20, 1970  Eduardo [name] is a 54 y.o. year old male who sees Bromley, Saddie FALCON, PA-C for primary care. I reached out to Eduardo LELON Colt by phone today to offer complex care management services.  [name] was given information about Complex Care Management services today including:   The Complex Care Management services include support from the care team which includes your Nurse Care Manager, Clinical Social Worker, or Pharmacist.  The Complex Care Management team is here to help remove barriers to the health concerns and goals most important to you. Complex Care Management services are voluntary, and the patient may decline or stop services at any time by request to their care team member.   Complex Care Management Consent Status: Patient agreed to services and verbal consent obtained.   Follow up plan:  Telephone appointment with complex care management team member scheduled for:  12/07/24  Encounter Outcome:  Patient Scheduled  Doyce Razor [facility]  [facility], [facility] Guide Direct Dial: [phone]  Fax: [phone]

## 2024-11-23 NOTE — Assessment & Plan Note (Signed)
 Viewed left foot wound today which is bone exposed. He is following with wound care and currently being treated for infection on Augmentin   -Continue follow ups with wound care, including appt tomorrow  - Finish course of Abx  - Advised to stay off foot as much as possible since he is not working now, which will allow for better healing of the wound

## 2024-11-23 NOTE — Assessment & Plan Note (Signed)
 Following with psychiatry. Feels that Trintellix  and mirtazpine are working well for him.  - Will cont to monitor

## 2024-11-23 NOTE — Assessment & Plan Note (Signed)
 Lasix  was effective in relieving the swelling. No swelling noted to legs today.  - Continue to monitor for increased swelling

## 2024-11-23 NOTE — Assessment & Plan Note (Signed)
 Low-dose chest CT for lung cancer screening showed the following:     1. Lung-RADS 4B, suspicious. Additional imaging evaluation or consultation with Pulmonology or Thoracic Surgery recommended. Increasing part solid nodule in the right lower lobe with increasing ground-glass and solid components. 2. Remaining pulmonary nodules are stable. 3. Coronary artery calcifications. 4.  Emphysema (ICD10-J43.9)  Patient is currently following with pulmonology for further management. PET scan results pending follow up with pulmonology.

## 2024-11-23 NOTE — Assessment & Plan Note (Signed)
 Nicotine  dependence with significantly reduced smoking. He has decreased from 2ppd to 1 ppd. Congratulated him on his efforts.  Interested in quitting. Discussed nicotine  patches tapering over 6-8 weeks. - Prescribed nicotine  patches starting at 21 mg. - Will reassess nicotine  patch dosage in 6-8 weeks.

## 2024-11-23 NOTE — Assessment & Plan Note (Signed)
 BP Goal <130/80. BP is 134/81 today and patient admits he has not been taking any blood pressure medication. He has significantly reduced his smoking from 2 ppd to 1 ppd. BP is generally at goal at home and he checks it several times a week.  - Continue to work on smoking cessation efforts and monitoring BP at home

## 2024-11-23 NOTE — Assessment & Plan Note (Signed)
 Identified on recent chest CT. Significant plaque in aorta. Cholesterol levels normal. Discussed benefits of cholesterol medication and smoking cessation. - Encouraged smoking cessation. - Recommended statin therapy to reduce his risk. Patient declines at this time. Risks and benefits discussed.

## 2024-11-23 NOTE — Progress Notes (Signed)
 "  Established Patient Office Visit  Subjective   Patient ID: Eduardo [name], male    DOB: Feb 18, 1971  Age: 54 y.o. MRN: [id number]  Chief Complaint  Patient presents with   Medical Management of Chronic Issues    HPI  Discussed the use of AI scribe software for clinical note transcription with the patient, who gave verbal consent to proceed.  History of Present Illness   Eduardo [name] is a 54 year old male who presents for follow up on smoking cessation and HTN   Hypertension and blood pressure management - Not consistently taking antihypertensive medication despite obtaining a higher dose two months ago - Monitors blood pressure at home and reports good control, attributing improvement to reduced stress since he is no longer working  - Occasionally takes medication when he feels it is necessary - No chest pain, shortness of breath, or palpitations - Feels that swelling in his legs has also significantly improved since his visit last time despite not taking increased dose of hydrochlorothiazide    Left foot wound and wound care - Chronic wound present on left foot - Follows with wound care for chronic management and has follow up appointment tomorrow  - Currently taking Augmentin  for wound infection, which causes nausea even when taken with food  Sleep disturbance and pain - Using mirtazapine  (Remeron ) for sleep, which is effective - Continues to experience grogginess and is occasionally awakened by pain  Smoking and tobacco use - Reduced smoking from almost two packs per day to approximately one pack per day - Interested in using nicotine  patches to aid cessation  Gastrointestinal and dermatologic symptoms - Concerned about possible risk of GI parasites due to constipation and skin issues  Occupational and financial concerns - Currently not working and on leave, pending medical clearance from providers - No income and concerned about financial situation - In  contact with vocational rehabilitation, but has not received updates         ROS Per HPI.    Objective:     BP 134/81   Pulse 73   Temp 97.8 F (36.6 C) (Oral)   Ht 6' 5 (1.956 m)   Wt (!) 351 lb 0.6 oz (159.2 kg)   SpO2 97%   BMI 41.63 kg/m    Physical Exam Constitutional:      General: He is not in acute distress.    Appearance: Normal appearance.  Cardiovascular:     Rate and Rhythm: Normal rate and regular rhythm.     Heart sounds: Normal heart sounds. No murmur heard.    No friction rub. No gallop.  Pulmonary:     Effort: Pulmonary effort is normal. No respiratory distress.     Breath sounds: Normal breath sounds.  Musculoskeletal:        General: No swelling.  Skin:    General: Skin is warm and dry.  Neurological:     General: No focal deficit present.     Mental Status: He is alert.  Psychiatric:        Mood and Affect: Mood normal.        Behavior: Behavior normal.        Thought Content: Thought content normal.      No results found for any visits on 11/23/24.    The ASCVD Risk score (Arnett DK, et al., 2019) failed to calculate for the following reasons:   The valid total cholesterol range is 130 to 320 mg/dL    Assessment &  Plan:   Tobacco use disorder -     Nicotine ; Place 1 patch (21 mg total) onto the skin daily.  Dispense: 28 patch; Refill: 1  Severe episode of recurrent major depressive disorder, without psychotic features [facility]) Assessment & Plan: Following with psychiatry. Feels that Trintellix  and mirtazpine are working well for him.  - Will cont to monitor    Orders: -     AMB Referral [facility] Care Management  Hypertension, unspecified type Assessment & Plan: BP Goal <130/80. BP is 134/81 today and patient admits he has not been taking any blood pressure medication. He has significantly reduced his smoking from 2 ppd to 1 ppd. BP is generally at goal at home and he checks it several times a week.  - Continue to work on  smoking cessation efforts and monitoring BP at home   Lung nodule Assessment & Plan: Low-dose chest CT for lung cancer screening showed the following:     1. Lung-RADS 4B, suspicious. Additional imaging evaluation or consultation with Pulmonology or Thoracic Surgery recommended. Increasing part solid nodule in the right lower lobe with increasing ground-glass and solid components. 2. Remaining pulmonary nodules are stable. 3. Coronary artery calcifications. 4.  Emphysema (ICD10-J43.9)   Patient is currently following with pulmonology for further management. PET scan results pending follow up with pulmonology.  Orders: -     AMB Referral [facility] Care Management  Stasis dermatitis of both legs Assessment & Plan: Lasix  was effective in relieving the swelling. No swelling noted to legs today.  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 He is following with wound care and currently being treated for infection on Augmentin   -Continue follow ups with wound care, including appt tomorrow  - Finish course of Abx  - Advised to stay off foot as much as possible since he is not working now, which will allow for better healing of the wound  Orders: -     AMB Referral [facility] Care Management  [facility]A on CPAP  Financial difficulties -     AMB Referral [facility] Care Management  Psychophysiological insomnia Assessment & Plan: Improved with mirtazapine  15 mg nightly. Continue this medication as prescribed.    Nicotine  dependence with current use Assessment & Plan: Nicotine  dependence with significantly reduced smoking. He has decreased from 2ppd to 1 ppd. Congratulated him on his efforts.  Interested in quitting. Discussed nicotine  patches tapering over 6-8 weeks. - Prescribed nicotine  patches starting at 21 mg. - Will reassess  nicotine  patch dosage in 6-8 weeks.   Aortic atherosclerosis Assessment & Plan: Identified on recent chest CT. Significant plaque in aorta. Cholesterol levels normal. Discussed benefits of cholesterol medication and smoking cessation. - Encouraged smoking cessation. - Recommended statin therapy to reduce his risk. Patient declines at this time. Risks and benefits discussed.         Return in about 4 months (around 03/23/2025) for HTN/med check.    Saddie JULIANNA Sacks, PA-C "

## 2024-11-24 ENCOUNTER — Encounter (HOSPITAL_BASED_OUTPATIENT_CLINIC_OR_DEPARTMENT_OTHER): Admitting: General Surgery

## 2024-11-27 ENCOUNTER — Encounter (HOSPITAL_BASED_OUTPATIENT_CLINIC_OR_DEPARTMENT_OTHER): Admitting: General Surgery

## 2024-12-01 ENCOUNTER — Encounter (HOSPITAL_BASED_OUTPATIENT_CLINIC_OR_DEPARTMENT_OTHER): Admitting: General Surgery

## 2024-12-07 ENCOUNTER — Telehealth: Admitting: Licensed Clinical Social Worker

## 2024-12-28 ENCOUNTER — Other Ambulatory Visit

## 2024-12-29 ENCOUNTER — Encounter (HOSPITAL_COMMUNITY): Admitting: Physician Assistant

## 2025-03-21 ENCOUNTER — Other Ambulatory Visit

## 2025-03-27 ENCOUNTER — Ambulatory Visit
